# Patient Record
Sex: Female | Born: 1940 | Race: Black or African American | Hispanic: No | Marital: Single | Smoking: Former smoker
Health system: Southern US, Community
[De-identification: ages and names within clinical notes are randomized; demographics above are authoritative.]

## PROBLEM LIST (undated history)

## (undated) DIAGNOSIS — K635 Polyp of colon: Secondary | ICD-10-CM

## (undated) DIAGNOSIS — G4730 Sleep apnea, unspecified: Secondary | ICD-10-CM

## (undated) DIAGNOSIS — N393 Stress incontinence (female) (male): Secondary | ICD-10-CM

## (undated) DIAGNOSIS — M1990 Unspecified osteoarthritis, unspecified site: Secondary | ICD-10-CM

## (undated) DIAGNOSIS — K219 Gastro-esophageal reflux disease without esophagitis: Secondary | ICD-10-CM

## (undated) DIAGNOSIS — L509 Urticaria, unspecified: Secondary | ICD-10-CM

## (undated) DIAGNOSIS — Z973 Presence of spectacles and contact lenses: Secondary | ICD-10-CM

## (undated) DIAGNOSIS — I10 Essential (primary) hypertension: Secondary | ICD-10-CM

## (undated) DIAGNOSIS — E785 Hyperlipidemia, unspecified: Secondary | ICD-10-CM

## (undated) DIAGNOSIS — E538 Deficiency of other specified B group vitamins: Secondary | ICD-10-CM

## (undated) DIAGNOSIS — R0600 Dyspnea, unspecified: Secondary | ICD-10-CM

## (undated) DIAGNOSIS — E119 Type 2 diabetes mellitus without complications: Secondary | ICD-10-CM

## (undated) DIAGNOSIS — N189 Chronic kidney disease, unspecified: Secondary | ICD-10-CM

## (undated) DIAGNOSIS — M5410 Radiculopathy, site unspecified: Secondary | ICD-10-CM

## (undated) DIAGNOSIS — E1140 Type 2 diabetes mellitus with diabetic neuropathy, unspecified: Secondary | ICD-10-CM

## (undated) DIAGNOSIS — D649 Anemia, unspecified: Secondary | ICD-10-CM

## (undated) HISTORY — PX: APPENDECTOMY: SHX54

## (undated) HISTORY — PX: DILATION AND CURETTAGE OF UTERUS: SHX78

## (undated) HISTORY — DX: Radiculopathy, site unspecified: M54.10

## (undated) HISTORY — PX: CARPAL TUNNEL RELEASE: SHX101

## (undated) HISTORY — DX: Type 2 diabetes mellitus with diabetic neuropathy, unspecified: E11.40

## (undated) HISTORY — PX: ABDOMINAL HYSTERECTOMY: SHX81

## (undated) HISTORY — DX: Urticaria, unspecified: L50.9

## (undated) HISTORY — DX: Stress incontinence (female) (male): N39.3

## (undated) HISTORY — PX: CYSTOSCOPY: SHX5120

## (undated) HISTORY — DX: Chronic kidney disease, unspecified: N18.9

## (undated) HISTORY — DX: Deficiency of other specified B group vitamins: E53.8

## (undated) HISTORY — DX: Anemia, unspecified: D64.9

## (undated) HISTORY — DX: Unspecified osteoarthritis, unspecified site: M19.90

## (undated) HISTORY — DX: Polyp of colon: K63.5

## (undated) HISTORY — PX: BREAST BIOPSY: SHX20

## (undated) HISTORY — PX: COLONOSCOPY: SHX174

## (undated) HISTORY — PX: EYE SURGERY: SHX253

---

## 2004-07-26 ENCOUNTER — Observation Stay (HOSPITAL_COMMUNITY): Admission: RE | Admit: 2004-07-26 | Discharge: 2004-07-27 | Payer: Self-pay | Admitting: Gynecology

## 2004-09-09 ENCOUNTER — Ambulatory Visit: Payer: Self-pay | Admitting: Orthopedic Surgery

## 2005-03-30 ENCOUNTER — Inpatient Hospital Stay: Payer: Self-pay | Admitting: Internal Medicine

## 2005-03-30 ENCOUNTER — Other Ambulatory Visit: Payer: Self-pay

## 2005-04-09 ENCOUNTER — Ambulatory Visit: Payer: Self-pay | Admitting: Internal Medicine

## 2005-07-08 ENCOUNTER — Ambulatory Visit: Payer: Self-pay | Admitting: Family Medicine

## 2005-08-02 ENCOUNTER — Emergency Department: Payer: Self-pay | Admitting: Emergency Medicine

## 2005-10-22 ENCOUNTER — Ambulatory Visit: Payer: Self-pay | Admitting: Unknown Physician Specialty

## 2005-11-11 ENCOUNTER — Ambulatory Visit: Payer: Self-pay | Admitting: Internal Medicine

## 2005-11-21 ENCOUNTER — Ambulatory Visit: Payer: Self-pay | Admitting: Internal Medicine

## 2005-12-21 ENCOUNTER — Ambulatory Visit: Payer: Self-pay | Admitting: Internal Medicine

## 2006-01-21 ENCOUNTER — Ambulatory Visit: Payer: Self-pay | Admitting: Internal Medicine

## 2006-02-21 ENCOUNTER — Ambulatory Visit: Payer: Self-pay | Admitting: Internal Medicine

## 2006-04-02 ENCOUNTER — Ambulatory Visit: Payer: Self-pay | Admitting: Internal Medicine

## 2006-04-23 ENCOUNTER — Ambulatory Visit: Payer: Self-pay | Admitting: Internal Medicine

## 2006-05-23 ENCOUNTER — Ambulatory Visit: Payer: Self-pay | Admitting: Internal Medicine

## 2006-07-03 ENCOUNTER — Ambulatory Visit: Payer: Self-pay | Admitting: Internal Medicine

## 2006-07-24 ENCOUNTER — Ambulatory Visit: Payer: Self-pay | Admitting: Internal Medicine

## 2006-08-22 ENCOUNTER — Ambulatory Visit: Payer: Self-pay | Admitting: Internal Medicine

## 2006-09-22 ENCOUNTER — Ambulatory Visit: Payer: Self-pay | Admitting: Internal Medicine

## 2006-10-22 ENCOUNTER — Ambulatory Visit: Payer: Self-pay | Admitting: Internal Medicine

## 2006-10-26 ENCOUNTER — Ambulatory Visit: Payer: Self-pay | Admitting: Internal Medicine

## 2006-11-22 ENCOUNTER — Ambulatory Visit: Payer: Self-pay | Admitting: Internal Medicine

## 2006-12-22 ENCOUNTER — Ambulatory Visit: Payer: Self-pay | Admitting: Internal Medicine

## 2007-01-22 ENCOUNTER — Ambulatory Visit: Payer: Self-pay | Admitting: Internal Medicine

## 2007-02-22 ENCOUNTER — Ambulatory Visit: Payer: Self-pay | Admitting: Internal Medicine

## 2007-03-24 ENCOUNTER — Ambulatory Visit: Payer: Self-pay | Admitting: Internal Medicine

## 2007-04-24 ENCOUNTER — Ambulatory Visit: Payer: Self-pay | Admitting: Internal Medicine

## 2007-05-24 ENCOUNTER — Ambulatory Visit: Payer: Self-pay | Admitting: Internal Medicine

## 2007-06-24 ENCOUNTER — Ambulatory Visit: Payer: Self-pay | Admitting: Internal Medicine

## 2007-06-24 HISTORY — PX: LUMBAR FUSION: SHX111

## 2007-06-27 ENCOUNTER — Inpatient Hospital Stay: Payer: Self-pay | Admitting: Internal Medicine

## 2007-06-27 ENCOUNTER — Other Ambulatory Visit: Payer: Self-pay

## 2007-07-21 ENCOUNTER — Ambulatory Visit: Payer: Self-pay

## 2007-08-06 ENCOUNTER — Ambulatory Visit: Payer: Self-pay | Admitting: Internal Medicine

## 2007-08-22 ENCOUNTER — Ambulatory Visit: Payer: Self-pay | Admitting: Internal Medicine

## 2007-09-22 ENCOUNTER — Ambulatory Visit: Payer: Self-pay | Admitting: Internal Medicine

## 2007-11-09 ENCOUNTER — Ambulatory Visit: Payer: Self-pay | Admitting: Unknown Physician Specialty

## 2007-11-09 ENCOUNTER — Other Ambulatory Visit: Payer: Self-pay

## 2007-11-11 ENCOUNTER — Ambulatory Visit: Payer: Self-pay | Admitting: Internal Medicine

## 2007-11-22 ENCOUNTER — Inpatient Hospital Stay: Payer: Self-pay | Admitting: Unknown Physician Specialty

## 2007-11-23 ENCOUNTER — Ambulatory Visit: Payer: Self-pay | Admitting: Internal Medicine

## 2007-11-23 ENCOUNTER — Other Ambulatory Visit: Payer: Self-pay

## 2007-11-27 ENCOUNTER — Ambulatory Visit: Payer: Self-pay | Admitting: Internal Medicine

## 2007-12-08 ENCOUNTER — Ambulatory Visit: Payer: Self-pay | Admitting: Internal Medicine

## 2007-12-22 ENCOUNTER — Ambulatory Visit: Payer: Self-pay | Admitting: Internal Medicine

## 2008-01-22 ENCOUNTER — Ambulatory Visit: Payer: Self-pay | Admitting: Internal Medicine

## 2008-02-22 ENCOUNTER — Ambulatory Visit: Payer: Self-pay | Admitting: Internal Medicine

## 2008-03-23 ENCOUNTER — Ambulatory Visit: Payer: Self-pay | Admitting: Internal Medicine

## 2008-05-04 ENCOUNTER — Ambulatory Visit: Payer: Self-pay | Admitting: Internal Medicine

## 2008-05-07 ENCOUNTER — Ambulatory Visit: Payer: Self-pay | Admitting: Family Medicine

## 2008-05-23 ENCOUNTER — Ambulatory Visit: Payer: Self-pay | Admitting: Internal Medicine

## 2009-11-30 ENCOUNTER — Ambulatory Visit: Payer: Self-pay | Admitting: Family Medicine

## 2009-12-01 ENCOUNTER — Emergency Department: Payer: Self-pay | Admitting: Emergency Medicine

## 2009-12-20 ENCOUNTER — Ambulatory Visit: Payer: Self-pay

## 2011-05-03 ENCOUNTER — Emergency Department: Payer: Self-pay | Admitting: Emergency Medicine

## 2012-02-21 ENCOUNTER — Ambulatory Visit: Payer: Self-pay | Admitting: Internal Medicine

## 2012-12-16 ENCOUNTER — Ambulatory Visit: Payer: Self-pay

## 2013-11-04 ENCOUNTER — Ambulatory Visit: Payer: Self-pay

## 2013-11-15 ENCOUNTER — Ambulatory Visit: Payer: Self-pay

## 2013-11-17 ENCOUNTER — Ambulatory Visit: Payer: Self-pay

## 2013-11-22 ENCOUNTER — Encounter (HOSPITAL_BASED_OUTPATIENT_CLINIC_OR_DEPARTMENT_OTHER): Payer: Self-pay | Admitting: *Deleted

## 2013-11-22 NOTE — Progress Notes (Signed)
11/22/13 1558  OBSTRUCTIVE SLEEP APNEA  Have you ever been diagnosed with sleep apnea through a sleep study? Yes  If yes, do you have and use a CPAP or BPAP machine every night? 0  Do you snore loudly (loud enough to be heard through closed doors)?  1  Do you often feel tired, fatigued, or sleepy during the daytime? 1  Has anyone observed you stop breathing during your sleep? 1  Do you have, or are you being treated for high blood pressure? 1  BMI more than 35 kg/m2? 1  Age over 62 years old? 1  Neck circumference greater than 40 cm/16 inches? 1  Gender: 0  Obstructive Sleep Apnea Score 7  Score 4 or greater  Results sent to PCP

## 2013-11-22 NOTE — Progress Notes (Signed)
Pt lives [place]-used to see cardiology for htn-stress test-echo done 2011-called-records in storage and would be 2 weeks to get pcp in [place] did sleep study 8 yr ago-used to use cpap-did not use, sent back-still snores and has apnea, Diabetic-controlled.-called for pcp notes-will review with anesthesia-she is having a total knee 7/15-to get clearance for that next month. Needs istat and ekg-bring overnight bag and all meds

## 2013-11-23 ENCOUNTER — Ambulatory Visit: Payer: Self-pay

## 2013-11-24 LAB — PATHOLOGY REPORT

## 2013-11-25 ENCOUNTER — Encounter (HOSPITAL_BASED_OUTPATIENT_CLINIC_OR_DEPARTMENT_OTHER): Payer: Medicare Other | Admitting: Anesthesiology

## 2013-11-25 ENCOUNTER — Ambulatory Visit (HOSPITAL_BASED_OUTPATIENT_CLINIC_OR_DEPARTMENT_OTHER)
Admission: RE | Admit: 2013-11-25 | Discharge: 2013-11-25 | Disposition: A | Discharge status: Home / Self Care | Payer: Medicare Other | Source: Ambulatory Visit | Attending: Orthopedic Surgery | Admitting: Orthopedic Surgery

## 2013-11-25 ENCOUNTER — Ambulatory Visit (HOSPITAL_BASED_OUTPATIENT_CLINIC_OR_DEPARTMENT_OTHER): Payer: Medicare Other | Admitting: Anesthesiology

## 2013-11-25 ENCOUNTER — Encounter (HOSPITAL_BASED_OUTPATIENT_CLINIC_OR_DEPARTMENT_OTHER): Payer: Self-pay | Admitting: *Deleted

## 2013-11-25 ENCOUNTER — Encounter (HOSPITAL_BASED_OUTPATIENT_CLINIC_OR_DEPARTMENT_OTHER)
Admission: RE | Disposition: A | Discharge status: Home / Self Care | Payer: Self-pay | Source: Ambulatory Visit | Attending: Orthopedic Surgery

## 2013-11-25 DIAGNOSIS — G4730 Sleep apnea, unspecified: Secondary | ICD-10-CM | POA: Insufficient documentation

## 2013-11-25 DIAGNOSIS — M1710 Unilateral primary osteoarthritis, unspecified knee: Secondary | ICD-10-CM | POA: Insufficient documentation

## 2013-11-25 DIAGNOSIS — I10 Essential (primary) hypertension: Secondary | ICD-10-CM | POA: Insufficient documentation

## 2013-11-25 DIAGNOSIS — M7520 Bicipital tendinitis, unspecified shoulder: Secondary | ICD-10-CM | POA: Insufficient documentation

## 2013-11-25 DIAGNOSIS — M6740 Ganglion, unspecified site: Secondary | ICD-10-CM | POA: Insufficient documentation

## 2013-11-25 DIAGNOSIS — K219 Gastro-esophageal reflux disease without esophagitis: Secondary | ICD-10-CM | POA: Insufficient documentation

## 2013-11-25 DIAGNOSIS — M67919 Unspecified disorder of synovium and tendon, unspecified shoulder: Secondary | ICD-10-CM | POA: Insufficient documentation

## 2013-11-25 DIAGNOSIS — IMO0002 Reserved for concepts with insufficient information to code with codable children: Secondary | ICD-10-CM

## 2013-11-25 DIAGNOSIS — Z87891 Personal history of nicotine dependence: Secondary | ICD-10-CM | POA: Insufficient documentation

## 2013-11-25 DIAGNOSIS — M719 Bursopathy, unspecified: Secondary | ICD-10-CM | POA: Insufficient documentation

## 2013-11-25 DIAGNOSIS — E119 Type 2 diabetes mellitus without complications: Secondary | ICD-10-CM | POA: Insufficient documentation

## 2013-11-25 HISTORY — DX: Sleep apnea, unspecified: G47.30

## 2013-11-25 HISTORY — DX: Essential (primary) hypertension: I10

## 2013-11-25 HISTORY — DX: Gastro-esophageal reflux disease without esophagitis: K21.9

## 2013-11-25 HISTORY — PX: RESECTION DISTAL CLAVICAL: SHX5053

## 2013-11-25 HISTORY — DX: Type 2 diabetes mellitus without complications: E11.9

## 2013-11-25 HISTORY — PX: SHOULDER ARTHROSCOPY WITH ROTATOR CUFF REPAIR AND SUBACROMIAL DECOMPRESSION: SHX5686

## 2013-11-25 HISTORY — DX: Hyperlipidemia, unspecified: E78.5

## 2013-11-25 HISTORY — DX: Presence of spectacles and contact lenses: Z97.3

## 2013-11-25 HISTORY — DX: Unspecified osteoarthritis, unspecified site: M19.90

## 2013-11-25 LAB — GLUCOSE, CAPILLARY: GLUCOSE-CAPILLARY: 158 mg/dL — AB (ref 70–99)

## 2013-11-25 LAB — POCT I-STAT, CHEM 8
BUN: 28 mg/dL — ABNORMAL HIGH (ref 6–23)
CHLORIDE: 101 meq/L (ref 96–112)
Calcium, Ion: 1.24 mmol/L (ref 1.13–1.30)
Creatinine, Ser: 1.5 mg/dL — ABNORMAL HIGH (ref 0.50–1.10)
GLUCOSE: 136 mg/dL — AB (ref 70–99)
HEMATOCRIT: 42 % (ref 36.0–46.0)
HEMOGLOBIN: 14.3 g/dL (ref 12.0–15.0)
Potassium: 3.9 mEq/L (ref 3.7–5.3)
SODIUM: 140 meq/L (ref 137–147)
TCO2: 26 mmol/L (ref 0–100)

## 2013-11-25 SURGERY — SHOULDER ARTHROSCOPY WITH ROTATOR CUFF REPAIR AND SUBACROMIAL DECOMPRESSION
Anesthesia: General | Site: Shoulder | Laterality: Right

## 2013-11-25 MED ORDER — ALBUTEROL SULFATE HFA 108 (90 BASE) MCG/ACT IN AERS
INHALATION_SPRAY | RESPIRATORY_TRACT | Status: DC | PRN
  Administered 2013-11-25: 2 via RESPIRATORY_TRACT

## 2013-11-25 MED ORDER — ASPIRIN 81 MG PO TABS: 81.0000 mg | ORAL_TABLET | Freq: Every day | ORAL | Status: DC

## 2013-11-25 MED ORDER — FENTANYL CITRATE 0.05 MG/ML IJ SOLN
INTRAMUSCULAR | Status: DC | PRN
  Administered 2013-11-25: 50 ug via INTRAVENOUS

## 2013-11-25 MED ORDER — LACTATED RINGERS IV SOLN
INTRAVENOUS | Status: DC
  Administered 2013-11-25 (×2): via INTRAVENOUS

## 2013-11-25 MED ORDER — SODIUM CHLORIDE 0.9 % IR SOLN
Status: DC | PRN
  Administered 2013-11-25: 12000 mL

## 2013-11-25 MED ORDER — SUCCINYLCHOLINE CHLORIDE 20 MG/ML IJ SOLN
INTRAMUSCULAR | Status: DC | PRN
  Administered 2013-11-25: 100 mg via INTRAVENOUS

## 2013-11-25 MED ORDER — FENTANYL CITRATE 0.05 MG/ML IJ SOLN
INTRAMUSCULAR | Status: AC
  Filled 2013-11-25: qty 6

## 2013-11-25 MED ORDER — PHENYLEPHRINE HCL 10 MG/ML IJ SOLN
INTRAMUSCULAR | Status: DC | PRN
  Administered 2013-11-25: 80 ug via INTRAVENOUS

## 2013-11-25 MED ORDER — OXYCODONE HCL 5 MG/5ML PO SOLN: 5.0000 mg | Freq: Once | ORAL | Status: DC | PRN

## 2013-11-25 MED ORDER — OXYCODONE HCL 5 MG PO TABS: 5.0000 mg | ORAL_TABLET | Freq: Once | ORAL | Status: DC | PRN

## 2013-11-25 MED ORDER — MIDAZOLAM HCL 2 MG/2ML IJ SOLN
INTRAMUSCULAR | Status: AC
  Filled 2013-11-25: qty 2

## 2013-11-25 MED ORDER — OXYCODONE HCL 5 MG PO TABS: 10.0000 mg | ORAL_TABLET | ORAL | Status: DC | PRN

## 2013-11-25 MED ORDER — PROMETHAZINE HCL 25 MG/ML IJ SOLN: 6.2500 mg | INTRAMUSCULAR | Status: DC | PRN

## 2013-11-25 MED ORDER — ROPIVACAINE HCL 5 MG/ML IJ SOLN
INTRAMUSCULAR | Status: DC | PRN
  Administered 2013-11-25: 30 mg via PERINEURAL

## 2013-11-25 MED ORDER — FENTANYL CITRATE 0.05 MG/ML IJ SOLN
50.0000 ug | INTRAMUSCULAR | Status: DC | PRN
  Administered 2013-11-25: 100 ug via INTRAVENOUS

## 2013-11-25 MED ORDER — DEXAMETHASONE SODIUM PHOSPHATE 4 MG/ML IJ SOLN
INTRAMUSCULAR | Status: DC | PRN
  Administered 2013-11-25: 10 mg via INTRAVENOUS

## 2013-11-25 MED ORDER — MIDAZOLAM HCL 2 MG/2ML IJ SOLN
1.0000 mg | INTRAMUSCULAR | Status: DC | PRN
  Administered 2013-11-25: 2 mg via INTRAVENOUS

## 2013-11-25 MED ORDER — EPHEDRINE SULFATE 50 MG/ML IJ SOLN
INTRAMUSCULAR | Status: DC | PRN
  Administered 2013-11-25: 15 mg via INTRAVENOUS

## 2013-11-25 MED ORDER — FENTANYL CITRATE 0.05 MG/ML IJ SOLN
INTRAMUSCULAR | Status: AC
  Filled 2013-11-25: qty 2

## 2013-11-25 MED ORDER — ONDANSETRON HCL 4 MG PO TABS: 4.0000 mg | ORAL_TABLET | Freq: Three times a day (TID) | ORAL | Status: DC | PRN

## 2013-11-25 MED ORDER — LIDOCAINE HCL (CARDIAC) 20 MG/ML IV SOLN
INTRAVENOUS | Status: DC | PRN
  Administered 2013-11-25: 50 mg via INTRAVENOUS

## 2013-11-25 MED ORDER — PHENYLEPHRINE HCL 10 MG/ML IJ SOLN
10.0000 mg | INTRAVENOUS | Status: DC | PRN
  Administered 2013-11-25: 40 ug/min via INTRAVENOUS

## 2013-11-25 MED ORDER — DEXAMETHASONE SODIUM PHOSPHATE 10 MG/ML IJ SOLN
INTRAMUSCULAR | Status: DC | PRN
  Administered 2013-11-25: 6 mg

## 2013-11-25 MED ORDER — DOCUSATE SODIUM 100 MG PO CAPS: 100.0000 mg | ORAL_CAPSULE | Freq: Two times a day (BID) | ORAL | Status: DC

## 2013-11-25 MED ORDER — PROPOFOL 10 MG/ML IV BOLUS
INTRAVENOUS | Status: DC | PRN
  Administered 2013-11-25: 150 mg via INTRAVENOUS

## 2013-11-25 MED ORDER — HYDROMORPHONE HCL PF 1 MG/ML IJ SOLN: 0.2500 mg | INTRAMUSCULAR | Status: DC | PRN

## 2013-11-25 MED ORDER — CLINDAMYCIN PHOSPHATE 600 MG/50ML IV SOLN
INTRAVENOUS | Status: AC
  Filled 2013-11-25: qty 50

## 2013-11-25 SURGICAL SUPPLY — 93 items
ANCHOR SUT BIO SW 4.75X19.1 (Anchor) ×4 IMPLANT
BENZOIN TINCTURE PRP APPL 2/3 (GAUZE/BANDAGES/DRESSINGS) ×2 IMPLANT
BLADE 15 SAFETY STRL DISP (BLADE) ×2 IMPLANT
BLADE AVERAGE 25X9 (BLADE) ×2 IMPLANT
BLADE CUTTER GATOR 3.5 (BLADE) ×2 IMPLANT
BLADE CUTTER MENIS 5.5 (BLADE) IMPLANT
BLADE GREAT WHITE 4.2 (BLADE) IMPLANT
BLADE SURG ROTATE 9660 (MISCELLANEOUS) IMPLANT
BUR OVAL 4.0 (BURR) IMPLANT
BUR OVAL 6.0 (BURR) ×2 IMPLANT
CANISTER SUCT 3000ML (MISCELLANEOUS) IMPLANT
CANNULA 5.75X71 LONG (CANNULA) IMPLANT
CANNULA TWIST IN 8.25X7CM (CANNULA) ×2 IMPLANT
CHLORAPREP W/TINT 26ML (MISCELLANEOUS) ×2 IMPLANT
DECANTER SPIKE VIAL GLASS SM (MISCELLANEOUS) IMPLANT
DRAPE INCISE IOBAN 66X45 STRL (DRAPES) ×2 IMPLANT
DRAPE SHOULDER BEACH CHAIR (DRAPES) ×2 IMPLANT
DRAPE STERI 35X30 U-POUCH (DRAPES) IMPLANT
DRAPE U 20/CS (DRAPES) ×2 IMPLANT
DRAPE U-SHAPE 47X51 STRL (DRAPES) ×2 IMPLANT
DRAPE U-SHAPE 76X120 STRL (DRAPES) IMPLANT
DRSG EMULSION OIL 3X3 NADH (GAUZE/BANDAGES/DRESSINGS) ×2 IMPLANT
DRSG PAD ABDOMINAL 8X10 ST (GAUZE/BANDAGES/DRESSINGS) ×2 IMPLANT
DRSG TEGADERM 4X4.75 (GAUZE/BANDAGES/DRESSINGS) IMPLANT
ELECT MENISCUS 165MM 90D (ELECTRODE) IMPLANT
ELECT NEEDLE TIP 2.8 STRL (NEEDLE) IMPLANT
ELECT REM PT RETURN 9FT ADLT (ELECTROSURGICAL) ×2
ELECTRODE REM PT RTRN 9FT ADLT (ELECTROSURGICAL) ×1 IMPLANT
GAUZE SPONGE 4X4 12PLY STRL (GAUZE/BANDAGES/DRESSINGS) ×2 IMPLANT
GAUZE XEROFORM 1X8 LF (GAUZE/BANDAGES/DRESSINGS) IMPLANT
GLOVE BIO SURGEON STRL SZ7.5 (GLOVE) IMPLANT
GLOVE BIO SURGEON STRL SZ8 (GLOVE) IMPLANT
GLOVE BIOGEL PI IND STRL 7.0 (GLOVE) ×1 IMPLANT
GLOVE BIOGEL PI IND STRL 7.5 (GLOVE) ×1 IMPLANT
GLOVE BIOGEL PI IND STRL 8 (GLOVE) ×2 IMPLANT
GLOVE BIOGEL PI IND STRL 8.5 (GLOVE) IMPLANT
GLOVE BIOGEL PI INDICATOR 7.0 (GLOVE) ×1
GLOVE BIOGEL PI INDICATOR 7.5 (GLOVE) ×1
GLOVE BIOGEL PI INDICATOR 8 (GLOVE) ×2
GLOVE BIOGEL PI INDICATOR 8.5 (GLOVE)
GLOVE EXAM NITRILE MD LF STRL (GLOVE) ×2 IMPLANT
GLOVE SURG SS PI 6.5 STRL IVOR (GLOVE) ×2 IMPLANT
GLOVE SURG SS PI 7.5 STRL IVOR (GLOVE) ×4 IMPLANT
GLOVE SURG SS PI 8.0 STRL IVOR (GLOVE) ×2 IMPLANT
GOWN STRL REUS W/ TWL LRG LVL3 (GOWN DISPOSABLE) ×2 IMPLANT
GOWN STRL REUS W/ TWL XL LVL3 (GOWN DISPOSABLE) ×1 IMPLANT
GOWN STRL REUS W/TWL LRG LVL3 (GOWN DISPOSABLE) ×2
GOWN STRL REUS W/TWL XL LVL3 (GOWN DISPOSABLE) ×1
MANIFOLD NEPTUNE II (INSTRUMENTS) ×2 IMPLANT
NDL SUT 6 .5 CRC .975X.05 MAYO (NEEDLE) IMPLANT
NEEDLE MAYO TAPER (NEEDLE)
NEEDLE SCORPION MULTI FIRE (NEEDLE) ×2 IMPLANT
NS IRRIG 1000ML POUR BTL (IV SOLUTION) ×2 IMPLANT
PACK ARTHROSCOPY DSU (CUSTOM PROCEDURE TRAY) ×2 IMPLANT
PACK BASIN DAY SURGERY FS (CUSTOM PROCEDURE TRAY) ×2 IMPLANT
PENCIL BUTTON HOLSTER BLD 10FT (ELECTRODE) ×2 IMPLANT
SET ARTHROSCOPY TUBING (MISCELLANEOUS) ×1
SET ARTHROSCOPY TUBING LN (MISCELLANEOUS) ×1 IMPLANT
SLEEVE SCD COMPRESS KNEE MED (MISCELLANEOUS) ×2 IMPLANT
SLING ARM IMMOBILIZER LRG (SOFTGOODS) IMPLANT
SLING ARM IMMOBILIZER MED (SOFTGOODS) ×2 IMPLANT
SLING ARM LRG ADULT FOAM STRAP (SOFTGOODS) IMPLANT
SLING ARM MED ADULT FOAM STRAP (SOFTGOODS) IMPLANT
SLING ARM XL FOAM STRAP (SOFTGOODS) IMPLANT
SPONGE LAP 18X18 X RAY DECT (DISPOSABLE) IMPLANT
SPONGE LAP 4X18 X RAY DECT (DISPOSABLE) ×2 IMPLANT
STAPLER VISISTAT 35W (STAPLE) IMPLANT
STRIP CLOSURE SKIN 1/2X4 (GAUZE/BANDAGES/DRESSINGS) ×2 IMPLANT
SUCTION FRAZIER TIP 10 FR DISP (SUCTIONS) ×2 IMPLANT
SUT ETHIBOND 2 OS 4 DA (SUTURE) IMPLANT
SUT ETHILON 2 0 FS 18 (SUTURE) IMPLANT
SUT ETHILON 3 0 PS 1 (SUTURE) IMPLANT
SUT FIBERWIRE #2 38 T-5 BLUE (SUTURE)
SUT MNCRL AB 3-0 PS2 18 (SUTURE) ×2 IMPLANT
SUT MNCRL AB 4-0 PS2 18 (SUTURE) ×2 IMPLANT
SUT MON AB 2-0 CT1 36 (SUTURE) ×2 IMPLANT
SUT RETRIEVER MED (INSTRUMENTS) IMPLANT
SUT TIGER TAPE 7 IN WHITE (SUTURE) ×2 IMPLANT
SUT VIC AB 0 CT1 27 (SUTURE)
SUT VIC AB 0 CT1 27XBRD ANBCTR (SUTURE) IMPLANT
SUT VIC AB 0 SH 27 (SUTURE) ×2 IMPLANT
SUT VIC AB 2-0 SH 27 (SUTURE)
SUT VIC AB 2-0 SH 27XBRD (SUTURE) IMPLANT
SUT VIC AB 3-0 FS2 27 (SUTURE) IMPLANT
SUT VIC AB 3-0 SH 27 (SUTURE)
SUT VIC AB 3-0 SH 27X BRD (SUTURE) IMPLANT
SUTURE FIBERWR #2 38 T-5 BLUE (SUTURE) IMPLANT
SYR BULB 3OZ (MISCELLANEOUS) ×2 IMPLANT
TAPE FIBER 2MM 7IN #2 BLUE (SUTURE) ×2 IMPLANT
TOWEL OR 17X24 6PK STRL BLUE (TOWEL DISPOSABLE) ×2 IMPLANT
TOWEL OR NON WOVEN STRL DISP B (DISPOSABLE) ×2 IMPLANT
WAND STAR VAC 90 (SURGICAL WAND) ×2 IMPLANT
YANKAUER SUCT BULB TIP NO VENT (SUCTIONS) ×2 IMPLANT

## 2013-11-25 NOTE — Op Note (Signed)
11/25/2013  4:48 PM  PATIENT:  Tina Mcdonald    PRE-OPERATIVE DIAGNOSIS:  Ganglion of right shoulder joint   POST-OPERATIVE DIAGNOSIS:  Same  PROCEDURE:  SHOULDER ARTHROSCOPY WITH DEBRIDEMENT ROTATOR CUFF REPAIR AND BICEPS TENODESIS, AND MANIPULATION, RIGHT SHOULDER OPEN RESECTION DISTAL CLAVICAL EXCISION SOFT TISSUE TUMOR SHOULDER DEEP SUBFASCIAL INTRAMUSCULAR/DEBRIDEMENT/ROTATOR CUFF REPAIR/BICEPS TENODESIS/MANIPULATION  SURGEON:  Renette Butters, MD  ASSISTANT: Joya Gaskins OPA  ANESTHESIA:   General  PREOPERATIVE INDICATIONS:  Tina Mcdonald is a  73 y.o. female with a diagnosis of Ganglion of right shoulder joint  who failed conservative measures and elected for surgical management.    The risks benefits and alternatives were discussed with the patient preoperatively including but not limited to the risks of infection, bleeding, nerve injury, cardiopulmonary complications, the need for revision surgery, among others, and the patient was willing to proceed.  OPERATIVE IMPLANTS: 2 swivel lock anchors  OPERATIVE FINDINGS: superior AC joint ganglion, supra/infra tendon tears that were reducible. Labral fraying, ruptured biceps  BLOOD LOSS: minimal  COMPLICATIONS: none  OPERATIVE PROCEDURE:  Patient was identified in the preoperative holding area and site was marked by me He was transported to the operating theater and placed on the table in beach chair position taking care to pad all bony prominences. After a preincinduction time out anesthesia was induced. The right upper extremity was prepped and draped in normal sterile fashion and a pre-incision timeout was performed. Tina Mcdonald received clinda for preoperative antibiotics.   Initially made a posterior arthroscopic portal and inserted the arthroscope into the glenohumeral joint. tour of the joint demonstrated the above operative findings  I created an anterior portal just lateral to the coracoid under direct visualization  using a spinal needle.  The biceps tendon was absent  I debrided her labral fraying  I then introduced the arthroscope into the subacromial space and brought the shaver into the anterior portal. I debrided the bursa for appropriate visualization.  I then performed a subacromial decompression using combination of the shaver ArthroCare and burr using a cutting block technique. As happy with the final elevation of the subacromial space on multiple portal views.    I debrided the rotator cuff tear and examined its mobility. There was a roughly 35 millimeters tear and I debrided the tear here. I then debrided the footprint to good bone for placement of the tendon. Next I marked the spot for the 2 swivel locks  I then removed all arthroscopic equipment instruments and she into the ganglion and distal clavicle. I made a roughly 3 cm incision over top of the a.c. joint. I dissected down to the ganglion and skeletonized the ganglion was relatively firm as mentioned before in my operative preop note. I then skeletonized the ganglion and its stalk did come from the a.c. joint I incised this and I did send it to pathology given a firm nature of it. I then identified the a.c. joint incised the periosteum around the distal clavicle measured out 1 cm and resected it using a fan saw blade. I palpated the edges and family resection of the appropriate. I then thoroughly irrigated this wound and closed in layers  Next I removed all arthroscopic equipment expressed all fluid and closed the portals with a nylon stitch.   A sterile dressing was applied the patient was taken the PACU in stable condition.  POST OPERATIVE PLAN: The patient will be in a sling full-time and keep the dressings clean dry and intact. DVT prophylaxis will consist  of early ambulation and ASA 81 daily while immobile

## 2013-11-25 NOTE — Anesthesia Preprocedure Evaluation (Addendum)
Anesthesia Evaluation  Patient identified by MRN, date of birth, ID band Patient awake    Reviewed: Allergy & Precautions, H&P , NPO status , Patient's Chart, lab work & pertinent test results, reviewed documented beta blocker date and time   History of Anesthesia Complications Negative for: history of anesthetic complications  Airway Mallampati: II TM Distance: >3 FB Neck ROM: Full    Dental  (+) Poor Dentition, Dental Advisory Given, Chipped,    Pulmonary sleep apnea , former smoker,    Pulmonary exam normal       Cardiovascular hypertension, Pt. on medications and Pt. on home beta blockers     Neuro/Psych negative neurological ROS  negative psych ROS   GI/Hepatic Neg liver ROS, GERD-  Medicated,  Endo/Other  diabetes  Renal/GU negative Renal ROS     Musculoskeletal negative musculoskeletal ROS (+)   Abdominal   Peds  Hematology   Anesthesia Other Findings   Reproductive/Obstetrics                          Anesthesia Physical Anesthesia Plan  ASA: III  Anesthesia Plan: General   Post-op Pain Management:    Induction: Intravenous  Airway Management Planned: Oral ETT  Additional Equipment:   Intra-op Plan:   Post-operative Plan: Extubation in OR  Informed Consent: I have reviewed the patients History and Physical, chart, labs and discussed the procedure including the risks, benefits and alternatives for the proposed anesthesia with the patient or authorized representative who has indicated his/her understanding and acceptance.   Dental advisory given  Plan Discussed with: CRNA, Anesthesiologist and Surgeon  Anesthesia Plan Comments:         Anesthesia Quick Evaluation

## 2013-11-25 NOTE — Anesthesia Postprocedure Evaluation (Signed)
Anesthesia Post Note  Patient: Tina Mcdonald  Procedure(s) Performed: Procedure(s) (LRB): SHOULDER ARTHROSCOPY WITH DEBRIDEMENT ROTATOR CUFF REPAIR  A (Right) RIGHT SHOULDER OPEN RESECTION DISTAL CLAVICAL EXCISION SOFT TISSUE TUMOR SHOULDER DEEP SUBFASCIAL INTRAMUSCULAR/DEBRIDEMENT/ROTATOR CUFF REPAIR/BICEPS TENODESIS/MANIPULATION (Right)  Anesthesia type: general  Patient location: PACU  Post pain: Pain level controlled  Post assessment: Patient's Cardiovascular Status Stable  Last Vitals:  Filed Vitals:   11/25/13 1745  BP: 129/49  Pulse: 92  Temp:   Resp: 21    Post vital signs: Reviewed and stable  Level of consciousness: sedated  Complications: No apparent anesthesia complications

## 2013-11-25 NOTE — Discharge Instructions (Signed)
Regional Anesthesia Blocks ° °1. Numbness or the inability to move the "blocked" extremity may last from 3-48 hours after placement. The length of time depends on the medication injected and your individual response to the medication. If the numbness is not going away after 48 hours, call your surgeon. ° °2. The extremity that is blocked will need to be protected until the numbness is gone and the  Strength has returned. Because you cannot feel it, you will need to take extra care to avoid injury. Because it may be weak, you may have difficulty moving it or using it. You may not know what position it is in without looking at it while the block is in effect. ° °3. For blocks in the legs and feet, returning to weight bearing and walking needs to be done carefully. You will need to wait until the numbness is entirely gone and the strength has returned. You should be able to move your leg and foot normally before you try and bear weight or walk. You will need someone to be with you when you first try to ensure you do not fall and possibly risk injury. ° °4. Bruising and tenderness at the needle site are common side effects and will resolve in a few days. ° °5. Persistent numbness or new problems with movement should be communicated to the surgeon or the [facility] ([phone])/ [facility] ([phone]). ° ° ° °Post Anesthesia Home Care Instructions ° °Activity: °Get plenty of rest for the remainder of the day. A responsible adult should stay with you for 24 hours following the procedure.  °For the next 24 hours, DO NOT: °-Drive a car °-Operate machinery °-Drink alcoholic beverages °-Take any medication unless instructed by your physician °-Make any legal decisions or sign important papers. ° °Meals: °Start with liquid foods such as gelatin or soup. Progress to regular foods as tolerated. Avoid greasy, spicy, heavy foods. If nausea and/or vomiting occur, drink only clear liquids until the  nausea and/or vomiting subsides. Call your physician if vomiting continues. ° °Special Instructions/Symptoms: °Your throat may feel dry or sore from the anesthesia or the breathing tube placed in your throat during surgery. If this causes discomfort, gargle with warm salt water. The discomfort should disappear within 24 hours. ° °

## 2013-11-25 NOTE — Progress Notes (Signed)
Assisted Dr. Singer with right, ultrasound guided, interscalene  block. Side rails up, monitors on throughout procedure. See vital signs in flow sheet. Tolerated Procedure well. 

## 2013-11-25 NOTE — H&P (Signed)
  [facility] [address] [place], [place] [address] [phone] A Division of [facility]  Ninetta Lights, M.D.   Robert A. Noemi Chapel, M.D.   Faythe Casa, M.D.   Johnny Bridge, M.D.   Almedia Balls, M.D Ernesta Amble. Percell Miller, M.D.  Joseph Pierini, M.D.  Lanier Prude, M.D.    Verner Chol, M.D. Mary L. Fenton Malling, PA-C  Kirstin A. Shepperson, PA-C  Josh [place], PA-C [place], [place]   RE: Tina Mcdonald, Tina Mcdonald   [id number]      DOB: April 18, 1941 INTIAL EVALUATION: 11-09-13 Reason for visit:  Right knee pain right shoulder lump. History of present illness: She has had right knee pain for several years she had  cortisone injections the last did not provide any relief. She has tried bracing and NSAID's without relief. Pain in on the medial and anterior aspect of her right knee. Her right shoulder has had a lump for roughly 2 months, it was initially non-tender but is slightly tender at this point because it is right under her bra strap. It has gotten bigger. She has not done anything to treat this. Please see associated documentation for this clinic visit for further past medical, family, surgical and social history, review of systems, and exam findings as this was reviewed by me.  EXAMINATION: Well appearing female in no apparent distress. Right lower extremity is neurovascularly intact she has occasional numbness from her spine surgery but otherwise sensation is intact. She has tenderness over the medial joint line with an antalgic gait. She has tenderness over her patellofemoral space. She has correctable clinical varus. Skin is benign no skin breakdown in her feet. Right upper extremity has mobile mildly tender mass over her AC joint it feels fluid filled and superficial. She has pain with shoulder range of motion, good rotator cuff strength.   IMAGING: X-rays reviewed by me:  show severe medial compartmental  arthritis and patellofemoral lateral based arthritis. Shoulder films demonstrate no obvious osseous abnormality with AC arthrosis.   ASSESSMENT: She has bicompartmental arthritis right knee. Right shoulder ganglion cyst AC joint.  PLAN: 1. For her shoulder I will get an MRI with and without contrast to confirm ganglion cyst. If that is the case she would like it resected, I offered non-operative management versus aspiration and injection but she would like to have a resection if it is a ganglion. 2. She would like to have right total knee arthroplasty. Discussed options including unloading brace hyaluronic acid injection cortisone injection NSAID use non-operative management physical therapy and weight loss. She would like to go forward with total knee arthroplasty but would like to do her shoulder first.  3. We have her prepared for both surgeries and I will call her if the MRI reveals a ganglion, if not we may have to refer her to somebody else for management of this prior to her total knee replacement. 4.  Ernesta Amble.  Percell Miller, M.D. Electronically verified by Ernesta Amble. Percell Miller, M.D. TDM:kah cc:  Golden Pop, MD fax [phone]  D 11-09-13 T 11-10-13

## 2013-11-25 NOTE — Anesthesia Procedure Notes (Signed)
Anesthesia Regional Block:  Interscalene brachial plexus block  Pre-Anesthetic Checklist: ,, timeout performed, Correct Patient, Correct Site, Correct Laterality, Correct Procedure, Correct Position, site marked, Risks and benefits discussed,  Surgical consent,  Pre-op evaluation,  At surgeon's request and post-op pain management  Laterality: Right  Prep: chloraprep       Needles:  Injection technique: Single-shot  Needle Type: Echogenic Stimulator Needle     Needle Length: 5cm 5 cm Needle Gauge: 22 and 22 G    Additional Needles:  Procedures: ultrasound guided (picture in chart) and nerve stimulator Interscalene brachial plexus block  Nerve Stimulator or Paresthesia:  Response: bicep contraction, 0.45 mA,   Additional Responses:   Narrative:  Start time: 11/25/2013 1:08 PM End time: 11/25/2013 1:18 PM Injection made incrementally with aspirations every 5 mL.  Performed by: Personally  Anesthesiologist: J. Tamela Gammon, MD  Additional Notes: Functioning IV was confirmed and monitors applied.  A 51mm 22ga echogenic arrow stimulator was used. Sterile prep and drape,hand hygiene and sterile gloves were used.Ultrasound guidance: relevant anatomy identified, needle position confirmed, local anesthetic spread visualized around nerve(s)., vascular puncture avoided.  Image printed for medical record.  Negative aspiration and negative test dose prior to incremental administration of local anesthetic. The patient tolerated the procedure well.

## 2013-11-25 NOTE — Transfer of Care (Signed)
Immediate Anesthesia Transfer of Care Note  Patient: Tina Mcdonald  Procedure(s) Performed: Procedure(s): SHOULDER ARTHROSCOPY WITH DEBRIDEMENT ROTATOR CUFF REPAIR  A (Right) RIGHT SHOULDER OPEN RESECTION DISTAL CLAVICAL EXCISION SOFT TISSUE TUMOR SHOULDER DEEP SUBFASCIAL INTRAMUSCULAR/DEBRIDEMENT/ROTATOR CUFF REPAIR/BICEPS TENODESIS/MANIPULATION (Right)  Patient Location: PACU  Anesthesia Type:General and Regional  Level of Consciousness: awake, alert  and oriented  Airway & Oxygen Therapy: Patient Spontanous Breathing and Patient connected to face mask oxygen  Post-op Assessment: Report given to PACU RN and Post -op Vital signs reviewed and stable  Post vital signs: Reviewed and stable  Complications: No apparent anesthesia complications

## 2013-11-25 NOTE — Interval H&P Note (Signed)
History and Physical Interval Note:  11/25/2013 8:58 AM  Tina Mcdonald  has presented today for surgery, with the diagnosis of Ganglion of joint   The various methods of treatment have been discussed with the patient and family. After consideration of risks, benefits and other options for treatment, the patient has consented to  Procedure(s): SHOULDER ARTHROSCOPY WITH DEBRIDMENT ROTATOR CUFF REPAIR AND BICEPS TENODESIS, AND MANIPULATION (Right) RIGHT SHOULDER OPEN RESECTION DISTAL CLAVICAL EXCISION SOFT TISSUE TUMOR SHOULDER DEEP SUBFASCIAL INTRAMUSCULAR/DEBRIDEMENT/ROTATOR CUFF REPAIR/BICEPS TENODESIS/MANIPULATION (Right) as a surgical intervention .  The patient's history has been reviewed, patient examined, no change in status, stable for surgery.  I have reviewed the patient's chart and labs.  Questions were answered to the patient's satisfaction.     Renette Butters

## 2013-11-30 ENCOUNTER — Encounter (HOSPITAL_BASED_OUTPATIENT_CLINIC_OR_DEPARTMENT_OTHER): Payer: Self-pay | Admitting: Orthopedic Surgery

## 2013-12-19 ENCOUNTER — Other Ambulatory Visit (HOSPITAL_COMMUNITY): Payer: Self-pay

## 2013-12-27 ENCOUNTER — Encounter (HOSPITAL_COMMUNITY): Admission: RE | Payer: Self-pay | Source: Ambulatory Visit

## 2013-12-27 ENCOUNTER — Inpatient Hospital Stay (HOSPITAL_COMMUNITY): Admission: RE | Admit: 2013-12-27 | Payer: Medicare Other | Source: Ambulatory Visit | Admitting: Orthopedic Surgery

## 2013-12-27 SURGERY — ARTHROPLASTY, KNEE, TOTAL
Anesthesia: General | Laterality: Right

## 2014-09-29 DIAGNOSIS — E1140 Type 2 diabetes mellitus with diabetic neuropathy, unspecified: Secondary | ICD-10-CM | POA: Diagnosis not present

## 2014-09-29 DIAGNOSIS — E1121 Type 2 diabetes mellitus with diabetic nephropathy: Secondary | ICD-10-CM | POA: Diagnosis not present

## 2014-09-29 DIAGNOSIS — E780 Pure hypercholesterolemia: Secondary | ICD-10-CM | POA: Diagnosis not present

## 2014-09-29 DIAGNOSIS — I129 Hypertensive chronic kidney disease with stage 1 through stage 4 chronic kidney disease, or unspecified chronic kidney disease: Secondary | ICD-10-CM | POA: Diagnosis not present

## 2014-10-16 DIAGNOSIS — E11319 Type 2 diabetes mellitus with unspecified diabetic retinopathy without macular edema: Secondary | ICD-10-CM | POA: Diagnosis not present

## 2014-11-17 DIAGNOSIS — H18413 Arcus senilis, bilateral: Secondary | ICD-10-CM | POA: Diagnosis not present

## 2014-11-17 DIAGNOSIS — E119 Type 2 diabetes mellitus without complications: Secondary | ICD-10-CM | POA: Diagnosis not present

## 2014-11-17 DIAGNOSIS — H2513 Age-related nuclear cataract, bilateral: Secondary | ICD-10-CM | POA: Diagnosis not present

## 2014-11-17 DIAGNOSIS — I10 Essential (primary) hypertension: Secondary | ICD-10-CM | POA: Diagnosis not present

## 2014-11-23 DIAGNOSIS — H2511 Age-related nuclear cataract, right eye: Secondary | ICD-10-CM | POA: Diagnosis not present

## 2014-12-29 DIAGNOSIS — Z029 Encounter for administrative examinations, unspecified: Secondary | ICD-10-CM | POA: Diagnosis not present

## 2014-12-30 DIAGNOSIS — K5730 Diverticulosis of large intestine without perforation or abscess without bleeding: Secondary | ICD-10-CM | POA: Diagnosis not present

## 2014-12-30 DIAGNOSIS — R918 Other nonspecific abnormal finding of lung field: Secondary | ICD-10-CM | POA: Diagnosis not present

## 2014-12-30 DIAGNOSIS — Z79899 Other long term (current) drug therapy: Secondary | ICD-10-CM | POA: Diagnosis not present

## 2014-12-30 DIAGNOSIS — N390 Urinary tract infection, site not specified: Secondary | ICD-10-CM | POA: Diagnosis not present

## 2014-12-30 DIAGNOSIS — E119 Type 2 diabetes mellitus without complications: Secondary | ICD-10-CM | POA: Diagnosis not present

## 2014-12-30 DIAGNOSIS — I10 Essential (primary) hypertension: Secondary | ICD-10-CM | POA: Diagnosis not present

## 2014-12-30 DIAGNOSIS — K760 Fatty (change of) liver, not elsewhere classified: Secondary | ICD-10-CM | POA: Diagnosis not present

## 2015-01-08 DIAGNOSIS — H2511 Age-related nuclear cataract, right eye: Secondary | ICD-10-CM | POA: Diagnosis not present

## 2015-01-08 DIAGNOSIS — H25811 Combined forms of age-related cataract, right eye: Secondary | ICD-10-CM | POA: Diagnosis not present

## 2015-01-09 DIAGNOSIS — H2512 Age-related nuclear cataract, left eye: Secondary | ICD-10-CM | POA: Diagnosis not present

## 2015-01-16 ENCOUNTER — Encounter: Payer: Self-pay | Admitting: Unknown Physician Specialty

## 2015-01-16 ENCOUNTER — Ambulatory Visit (INDEPENDENT_AMBULATORY_CARE_PROVIDER_SITE_OTHER): Payer: Medicare Other | Admitting: Unknown Physician Specialty

## 2015-01-16 VITALS — BP 148/76 | HR 93 | Temp 98.6°F | Ht 60.5 in | Wt 183.6 lb

## 2015-01-16 DIAGNOSIS — D649 Anemia, unspecified: Secondary | ICD-10-CM

## 2015-01-16 DIAGNOSIS — K635 Polyp of colon: Secondary | ICD-10-CM

## 2015-01-16 DIAGNOSIS — D509 Iron deficiency anemia, unspecified: Secondary | ICD-10-CM | POA: Insufficient documentation

## 2015-01-16 DIAGNOSIS — E1140 Type 2 diabetes mellitus with diabetic neuropathy, unspecified: Secondary | ICD-10-CM | POA: Insufficient documentation

## 2015-01-16 DIAGNOSIS — E785 Hyperlipidemia, unspecified: Secondary | ICD-10-CM

## 2015-01-16 DIAGNOSIS — M1990 Unspecified osteoarthritis, unspecified site: Secondary | ICD-10-CM

## 2015-01-16 DIAGNOSIS — R05 Cough: Secondary | ICD-10-CM

## 2015-01-16 DIAGNOSIS — I152 Hypertension secondary to endocrine disorders: Secondary | ICD-10-CM | POA: Insufficient documentation

## 2015-01-16 DIAGNOSIS — E1122 Type 2 diabetes mellitus with diabetic chronic kidney disease: Secondary | ICD-10-CM | POA: Insufficient documentation

## 2015-01-16 DIAGNOSIS — N393 Stress incontinence (female) (male): Secondary | ICD-10-CM

## 2015-01-16 DIAGNOSIS — E7800 Pure hypercholesterolemia, unspecified: Secondary | ICD-10-CM | POA: Insufficient documentation

## 2015-01-16 DIAGNOSIS — R059 Cough, unspecified: Secondary | ICD-10-CM

## 2015-01-16 DIAGNOSIS — M5410 Radiculopathy, site unspecified: Secondary | ICD-10-CM

## 2015-01-16 DIAGNOSIS — L509 Urticaria, unspecified: Secondary | ICD-10-CM

## 2015-01-16 DIAGNOSIS — D631 Anemia in chronic kidney disease: Secondary | ICD-10-CM | POA: Insufficient documentation

## 2015-01-16 DIAGNOSIS — I1310 Hypertensive heart and chronic kidney disease without heart failure, with stage 1 through stage 4 chronic kidney disease, or unspecified chronic kidney disease: Secondary | ICD-10-CM | POA: Insufficient documentation

## 2015-01-16 DIAGNOSIS — K219 Gastro-esophageal reflux disease without esophagitis: Secondary | ICD-10-CM

## 2015-01-16 DIAGNOSIS — E1165 Type 2 diabetes mellitus with hyperglycemia: Secondary | ICD-10-CM

## 2015-01-16 DIAGNOSIS — E1159 Type 2 diabetes mellitus with other circulatory complications: Secondary | ICD-10-CM | POA: Insufficient documentation

## 2015-01-16 DIAGNOSIS — I129 Hypertensive chronic kidney disease with stage 1 through stage 4 chronic kidney disease, or unspecified chronic kidney disease: Secondary | ICD-10-CM

## 2015-01-16 DIAGNOSIS — N183 Chronic kidney disease, stage 3 (moderate): Secondary | ICD-10-CM

## 2015-01-16 DIAGNOSIS — E1149 Type 2 diabetes mellitus with other diabetic neurological complication: Secondary | ICD-10-CM

## 2015-01-16 DIAGNOSIS — E1169 Type 2 diabetes mellitus with other specified complication: Secondary | ICD-10-CM | POA: Insufficient documentation

## 2015-01-16 DIAGNOSIS — E119 Type 2 diabetes mellitus without complications: Secondary | ICD-10-CM | POA: Insufficient documentation

## 2015-01-16 DIAGNOSIS — N189 Chronic kidney disease, unspecified: Secondary | ICD-10-CM | POA: Insufficient documentation

## 2015-01-16 DIAGNOSIS — E538 Deficiency of other specified B group vitamins: Secondary | ICD-10-CM

## 2015-01-16 DIAGNOSIS — I10 Essential (primary) hypertension: Secondary | ICD-10-CM

## 2015-01-16 MED ORDER — OMEPRAZOLE 20 MG PO CPDR: 20.0000 mg | DELAYED_RELEASE_CAPSULE | Freq: Every day | ORAL | Status: DC

## 2015-01-16 MED ORDER — GLUCOSE BLOOD VI STRP: ORAL_STRIP | Status: DC

## 2015-01-16 NOTE — Progress Notes (Signed)
BP 148/76 mmHg  Pulse 93  Temp(Src) 98.6 F (37 C)  Ht 5' 0.5" (1.537 m)  Wt 183 lb 9.6 oz (83.28 kg)  BMI 35.25 kg/m2  SpO2 97%  LMP  (LMP Unknown)   Subjective:    Patient ID: Tina Mcdonald, female    DOB: April 03, 1941, 74 y.o.   MRN: [id number]  HPI: Tina Mcdonald is a 74 y.o. female  Chief Complaint  Patient presents with  . Cough    pt states cough has been going on for a few months, gradually getting worse  . Gastrophageal Reflux    pt states it feels like all of her food gets stuck  . Diarrhea  . Emesis   Cough This is a new problem. The current episode started more than 1 month ago. The problem has been gradually worsening. The problem occurs constantly. The cough is productive of purulent sputum. Pertinent negatives include no chest pain, chills, ear congestion, ear pain, fever, headaches, heartburn, hemoptysis, myalgias, nasal congestion, postnasal drip, rash, rhinorrhea, sore throat, shortness of breath, sweats, weight loss or wheezing.  Gastrophageal Reflux She complains of coughing. She reports no chest pain, no heartburn, no sore throat or no wheezing. When she eats, it feels like something gets stuck and has to take something before and after she eats.  It wakes her up around 2A and it causes her to vomit. This is a chronic problem. The problem occurs constantly. The problem has been gradually worsening. Exacerbated by: [employer] foods. Pertinent negatives include no anemia, fatigue, melena, muscle weakness, orthopnea or weight loss. There are no known risk factors. She has tried an antacid (Taking OTC Ranitidine.  ) for the symptoms.     Relevant past medical, surgical, family and social history reviewed and updated as indicated. Interim medical history since our last visit reviewed. Allergies and medications reviewed and updated.  Review of Systems  Constitutional: Negative for fever, chills, weight loss and fatigue.  HENT: Negative for ear pain, postnasal  drip, rhinorrhea and sore throat.   Respiratory: Positive for cough. Negative for hemoptysis, shortness of breath and wheezing.   Cardiovascular: Negative for chest pain.  Gastrointestinal: Negative for heartburn and melena.  Musculoskeletal: Negative for myalgias and muscle weakness.  Skin: Negative for rash.  Neurological: Negative for headaches.    Per HPI unless specifically indicated above     Objective:    BP 148/76 mmHg  Pulse 93  Temp(Src) 98.6 F (37 C)  Ht 5' 0.5" (1.537 m)  Wt 183 lb 9.6 oz (83.28 kg)  BMI 35.25 kg/m2  SpO2 97%  LMP  (LMP Unknown)  Wt Readings from Last 3 Encounters:  01/16/15 183 lb 9.6 oz (83.28 kg)  09/29/14 189 lb (85.73 kg)  11/25/13 180 lb (81.647 kg)    Physical Exam  Constitutional: She appears well-developed.  HENT:  Head: Normocephalic and atraumatic.  Cardiovascular: Normal rate and regular rhythm.   Murmur heard. Pulmonary/Chest: Effort normal and breath sounds normal.  Musculoskeletal: Normal range of motion.  Neurological: She is alert.  Skin: Skin is warm and dry.  Psychiatric: She has a normal mood and affect. Her behavior is normal. Judgment and thought content normal.    Results for orders placed or performed during the hospital encounter of 11/25/13  Glucose, capillary  Result Value Ref Range   Glucose-Capillary 158 (H) 70 - 99 mg/dL  I-STAT, chem 8  Result Value Ref Range   Sodium 140 137 - 147 mEq/L  Potassium 3.9 3.7 - 5.3 mEq/L   Chloride 101 96 - 112 mEq/L   BUN 28 (H) 6 - 23 mg/dL   Creatinine, Ser 1.50 (H) 0.50 - 1.10 mg/dL   Glucose, Bld 136 (H) 70 - 99 mg/dL   Calcium, Ion 1.24 1.13 - 1.30 mmol/L   TCO2 26 0 - 100 mmol/L   Hemoglobin 14.3 12.0 - 15.0 g/dL   HCT 42.0 36.0 - 46.0 %      Assessment & Plan:   Problem List Items Addressed This Visit      Unprioritized   GERD (gastroesophageal reflux disease)    Not controlled with Ranitidine.  Prilosec on med list but she could not afford it earlier.   Will try again      Relevant Medications   omeprazole (PRILOSEC) 20 MG capsule    Other Visit Diagnoses    Cough    -  Primary    Will get chest x-ray.  ? reflux contributing or a cause?  Will rx Omeprazole.      Relevant Medications    omeprazole (PRILOSEC) 20 MG capsule    Other Relevant Orders    DG Chest 2 View        Follow up plan: Return in about 4 weeks (around 02/13/2015) for f/u and DM visit.

## 2015-01-16 NOTE — Assessment & Plan Note (Signed)
Not controlled with Ranitidine.  Prilosec on med list but she could not afford it earlier.  Will try again

## 2015-01-16 NOTE — Patient Instructions (Addendum)
Start Omeprazole daily.  Get chest x-ray in [place] Gastroesophageal Reflux Disease, Adult Gastroesophageal reflux disease (GERD) happens when acid from your stomach flows up into the esophagus. When acid comes in contact with the esophagus, the acid causes soreness (inflammation) in the esophagus. Over time, GERD may create small holes (ulcers) in the lining of the esophagus. CAUSES   Increased body weight. This puts pressure on the stomach, making acid rise from the stomach into the esophagus.  Smoking. This increases acid production in the stomach.  Drinking alcohol. This causes decreased pressure in the lower esophageal sphincter (valve or ring of muscle between the esophagus and stomach), allowing acid from the stomach into the esophagus.  Late evening meals and a full stomach. This increases pressure and acid production in the stomach.  A malformed lower esophageal sphincter. Sometimes, no cause is found. SYMPTOMS   Burning pain in the lower part of the mid-chest behind the breastbone and in the mid-stomach area. This may occur twice a week or more often.  Trouble swallowing.  Sore throat.  Dry cough.  Asthma-like symptoms including chest tightness, shortness of breath, or wheezing. DIAGNOSIS  Your caregiver may be able to diagnose GERD based on your symptoms. In some cases, X-rays and other tests may be done to check for complications or to check the condition of your stomach and esophagus. TREATMENT  Your caregiver may recommend over-the-counter or prescription medicines to help decrease acid production. Ask your caregiver before starting or adding any new medicines.  HOME CARE INSTRUCTIONS   Change the factors that you can control. Ask your caregiver for guidance concerning weight loss, quitting smoking, and alcohol consumption.  Avoid foods and drinks that make your symptoms worse, such as:  Caffeine or alcoholic drinks.  Chocolate.  Peppermint or mint  flavorings.  Garlic and onions.  Spicy foods.  Citrus fruits, such as oranges, lemons, or limes.  Tomato-based foods such as sauce, chili, salsa, and pizza.  Fried and fatty foods.  Avoid lying down for the 3 hours prior to your bedtime or prior to taking a nap.  Eat small, frequent meals instead of large meals.  Wear loose-fitting clothing. Do not wear anything tight around your waist that causes pressure on your stomach.  Raise the head of your bed 6 to 8 inches with wood blocks to help you sleep. Extra pillows will not help.  Only take over-the-counter or prescription medicines for pain, discomfort, or fever as directed by your caregiver.  Do not take aspirin, ibuprofen, or other nonsteroidal anti-inflammatory drugs (NSAIDs). SEEK IMMEDIATE MEDICAL CARE IF:   You have pain in your arms, neck, jaw, teeth, or back.  Your pain increases or changes in intensity or duration.  You develop nausea, vomiting, or sweating (diaphoresis).  You develop shortness of breath, or you faint.  Your vomit is green, yellow, black, or looks like coffee grounds or blood.  Your stool is red, bloody, or black. These symptoms could be signs of other problems, such as heart disease, gastric bleeding, or esophageal bleeding. MAKE SURE YOU:   Understand these instructions.  Will watch your condition.  Will get help right away if you are not doing well or get worse. Document Released: 03/19/2005 Document Revised: 09/01/2011 Document Reviewed: 12/27/2010 [facility] Patient Information 2015 [place], [place]. This information is not intended to replace advice given to you by your health care provider. Make sure you discuss any questions you have with your health care provider.

## 2015-01-22 ENCOUNTER — Ambulatory Visit
Admission: RE | Admit: 2015-01-22 | Discharge: 2015-01-22 | Disposition: A | Discharge status: Home / Self Care | Payer: Medicare Other | Source: Ambulatory Visit | Attending: Unknown Physician Specialty | Admitting: Unknown Physician Specialty

## 2015-01-22 ENCOUNTER — Other Ambulatory Visit: Payer: Self-pay | Admitting: Unknown Physician Specialty

## 2015-01-22 DIAGNOSIS — R059 Cough, unspecified: Secondary | ICD-10-CM

## 2015-01-22 DIAGNOSIS — R05 Cough: Secondary | ICD-10-CM

## 2015-01-29 ENCOUNTER — Telehealth: Payer: Self-pay | Admitting: Unknown Physician Specialty

## 2015-01-29 ENCOUNTER — Other Ambulatory Visit: Payer: Self-pay | Admitting: Unknown Physician Specialty

## 2015-01-29 DIAGNOSIS — R9389 Abnormal findings on diagnostic imaging of other specified body structures: Secondary | ICD-10-CM

## 2015-01-31 NOTE — Telephone Encounter (Signed)
Routing to provider  

## 2015-01-31 NOTE — Telephone Encounter (Signed)
Yes it is scheduled for tomorrow.

## 2015-01-31 NOTE — Telephone Encounter (Signed)
I wrote an order for one without contrast.  Did that get ordered?

## 2015-02-01 ENCOUNTER — Ambulatory Visit: Payer: Medicare Other

## 2015-02-01 ENCOUNTER — Ambulatory Visit
Admission: RE | Admit: 2015-02-01 | Discharge: 2015-02-01 | Disposition: A | Discharge status: Home / Self Care | Payer: Medicare Other | Source: Ambulatory Visit | Attending: Unknown Physician Specialty | Admitting: Unknown Physician Specialty

## 2015-02-01 DIAGNOSIS — R9389 Abnormal findings on diagnostic imaging of other specified body structures: Secondary | ICD-10-CM

## 2015-02-01 DIAGNOSIS — R938 Abnormal findings on diagnostic imaging of other specified body structures: Secondary | ICD-10-CM | POA: Diagnosis not present

## 2015-02-01 DIAGNOSIS — I2510 Atherosclerotic heart disease of native coronary artery without angina pectoris: Secondary | ICD-10-CM | POA: Diagnosis not present

## 2015-02-01 DIAGNOSIS — R918 Other nonspecific abnormal finding of lung field: Secondary | ICD-10-CM | POA: Diagnosis not present

## 2015-02-02 ENCOUNTER — Ambulatory Visit: Payer: Medicare Other

## 2015-02-05 ENCOUNTER — Other Ambulatory Visit: Payer: Self-pay | Admitting: Unknown Physician Specialty

## 2015-02-05 ENCOUNTER — Telehealth: Payer: Self-pay | Admitting: Unknown Physician Specialty

## 2015-02-05 DIAGNOSIS — I2510 Atherosclerotic heart disease of native coronary artery without angina pectoris: Secondary | ICD-10-CM

## 2015-02-05 DIAGNOSIS — H25812 Combined forms of age-related cataract, left eye: Secondary | ICD-10-CM | POA: Diagnosis not present

## 2015-02-05 DIAGNOSIS — H2512 Age-related nuclear cataract, left eye: Secondary | ICD-10-CM | POA: Diagnosis not present

## 2015-02-05 DIAGNOSIS — Z961 Presence of intraocular lens: Secondary | ICD-10-CM | POA: Diagnosis not present

## 2015-02-05 NOTE — Telephone Encounter (Signed)
Routing to provider. Did you call this patient as well?

## 2015-02-05 NOTE — Telephone Encounter (Signed)
Pt was returning a call from last week. She was out of town so she was not able to get the results. She would like a call back at the number provided.

## 2015-02-13 ENCOUNTER — Ambulatory Visit: Payer: Medicare Other | Admitting: Unknown Physician Specialty

## 2015-02-13 DIAGNOSIS — R05 Cough: Secondary | ICD-10-CM | POA: Diagnosis not present

## 2015-02-13 DIAGNOSIS — I208 Other forms of angina pectoris: Secondary | ICD-10-CM | POA: Diagnosis not present

## 2015-02-13 DIAGNOSIS — I2510 Atherosclerotic heart disease of native coronary artery without angina pectoris: Secondary | ICD-10-CM | POA: Diagnosis not present

## 2015-02-13 DIAGNOSIS — R0602 Shortness of breath: Secondary | ICD-10-CM | POA: Diagnosis not present

## 2015-02-20 ENCOUNTER — Encounter: Payer: Self-pay | Admitting: Unknown Physician Specialty

## 2015-02-20 ENCOUNTER — Ambulatory Visit (INDEPENDENT_AMBULATORY_CARE_PROVIDER_SITE_OTHER): Payer: Medicare Other | Admitting: Unknown Physician Specialty

## 2015-02-20 VITALS — BP 151/77 | HR 97 | Temp 98.4°F | Ht 61.5 in | Wt 183.4 lb

## 2015-02-20 DIAGNOSIS — N182 Chronic kidney disease, stage 2 (mild): Secondary | ICD-10-CM | POA: Diagnosis not present

## 2015-02-20 DIAGNOSIS — E785 Hyperlipidemia, unspecified: Secondary | ICD-10-CM | POA: Diagnosis not present

## 2015-02-20 DIAGNOSIS — N185 Chronic kidney disease, stage 5: Secondary | ICD-10-CM | POA: Diagnosis not present

## 2015-02-20 DIAGNOSIS — N189 Chronic kidney disease, unspecified: Secondary | ICD-10-CM | POA: Diagnosis not present

## 2015-02-20 DIAGNOSIS — I129 Hypertensive chronic kidney disease with stage 1 through stage 4 chronic kidney disease, or unspecified chronic kidney disease: Secondary | ICD-10-CM

## 2015-02-20 DIAGNOSIS — N183 Chronic kidney disease, stage 3 (moderate): Secondary | ICD-10-CM

## 2015-02-20 DIAGNOSIS — E1122 Type 2 diabetes mellitus with diabetic chronic kidney disease: Secondary | ICD-10-CM | POA: Diagnosis not present

## 2015-02-20 DIAGNOSIS — N181 Chronic kidney disease, stage 1: Secondary | ICD-10-CM | POA: Diagnosis not present

## 2015-02-20 DIAGNOSIS — K219 Gastro-esophageal reflux disease without esophagitis: Secondary | ICD-10-CM | POA: Diagnosis not present

## 2015-02-20 DIAGNOSIS — N184 Chronic kidney disease, stage 4 (severe): Secondary | ICD-10-CM | POA: Diagnosis not present

## 2015-02-20 LAB — MICROALBUMIN, URINE WAIVED
Creatinine, Urine Waived: 200 mg/dL (ref 10–300)
Microalb, Ur Waived: 30 mg/L — ABNORMAL HIGH (ref 0–19)
Microalb/Creat Ratio: <30 mg/g (ref <30)

## 2015-02-20 LAB — LIPID PANEL PICCOLO, WAIVED
CHOL/HDL RATIO PICCOLO,WAIVE: 3.7 mg/dL
CHOLESTEROL PICCOLO, WAIVED: 189 mg/dL (ref <200)
HDL Chol Piccolo, Waived: 51 mg/dL — ABNORMAL LOW (ref >59)
LDL Chol Calc Piccolo Waived: 119 mg/dL — ABNORMAL HIGH (ref <100)
Triglycerides Piccolo,Waived: 95 mg/dL (ref <150)
VLDL Chol Calc Piccolo,Waive: 19 mg/dL (ref <30)

## 2015-02-20 LAB — BAYER DCA HB A1C WAIVED: HB A1C (BAYER DCA - WAIVED): 7 % — ABNORMAL HIGH (ref <7.0)

## 2015-02-20 NOTE — Progress Notes (Signed)
BP 151/77 mmHg  Pulse 97  Temp(Src) 98.4 F (36.9 C)  Ht 5' 1.5" (1.562 m)  Wt 183 lb 6.4 oz (83.19 kg)  BMI 34.10 kg/m2  SpO2 96%  LMP  (LMP Unknown)   Subjective:    Patient ID: Tina Mcdonald, female    DOB: Jan 30, 1941, 74 y.o.   MRN: [id number]  HPI: Tina Mcdonald is a 74 y.o. female  Chief Complaint  Patient presents with  . Follow-up    pt is following up for cough, diarrhea, and gerd  . Diabetes    Diabetes Mellitus: She takes Metformin 500mg  twice daily, states she is faithful with her medication. Does not monitor blood sugars on a regular basis. Denies polydipsia, change in urination pattern or frequency. Denies numbness or sensation changes. Denies diplopia or blurred vision.   Hypertension: Blood pressure medication was changed one week ago by the cardiologist. Denies monitoring blood pressure at home. Denies headaches, fatigue or shortness of breath.  GERD: Started taking Prilosec about 2 weeks ago and reports improvement of symptoms. She is no longer being woken through the night. She reports she is still coughing.  Cough: Reviewed chest x-ray and CT, all results are negative with exception of atherosclerosis. Following up with cardiologist. She is scheduled for a stress test and ECHO this week.  Relevant past medical, surgical, family and social history reviewed and updated as indicated. Interim medical history since our last visit reviewed. Allergies and medications reviewed and updated.  Review of Systems  Constitutional: Negative.  Negative for fever, activity change, appetite change and fatigue.  HENT: Negative.  Negative for congestion and sore throat.   Respiratory: Positive for cough. Negative for chest tightness, shortness of breath, wheezing and stridor.   Cardiovascular: Negative.  Negative for chest pain, palpitations and leg swelling.  Skin: Negative.  Negative for color change, pallor, rash and wound.  Psychiatric/Behavioral: Negative.   Negative for behavioral problems, confusion, sleep disturbance and agitation.    Per HPI unless specifically indicated above     Objective:    BP 151/77 mmHg  Pulse 97  Temp(Src) 98.4 F (36.9 C)  Ht 5' 1.5" (1.562 m)  Wt 183 lb 6.4 oz (83.19 kg)  BMI 34.10 kg/m2  SpO2 96%  LMP  (LMP Unknown)    Physical Exam  Constitutional: She is oriented to person, place, and time. She appears well-developed and well-nourished. No distress.  HENT:  Head: Normocephalic and atraumatic.  Neck: Normal range of motion.  Cardiovascular: Normal rate, regular rhythm and normal heart sounds.  Exam reveals no gallop and no friction rub.   No murmur heard. Pulmonary/Chest: Effort normal and breath sounds normal. No respiratory distress. She has no wheezes. She has no rales. She exhibits no tenderness.  Musculoskeletal: Normal range of motion.  Neurological: She is alert and oriented to person, place, and time.  Skin: Skin is warm and dry. No rash noted. She is not diaphoretic. No erythema. No pallor.  Psychiatric: She has a normal mood and affect. Her behavior is normal. Judgment and thought content normal.       Assessment & Plan:   Problem List Items Addressed This Visit      Unprioritized   Diabetes mellitus with stage 3 chronic kidney disease    A1C today is 7.0 whicn is much improved.  Continue present medicatons.        Relevant Medications   metFORMIN (GLUMETZA) 500 MG (MOD) 24 hr tablet   Hyperlipidemia  Reviewed lipid panel.  Stable, continue present medications.       Relevant Medications   cloNIDine (CATAPRES) 0.1 MG tablet   Other Relevant Orders   Lipid Panel Piccolo, Waived   Hypertensive CKD (chronic kidney disease) - Primary    Stable, continue present medications.       Relevant Orders   Comprehensive metabolic panel   GERD (gastroesophageal reflux disease)    Now taking Prilosec and no longer being woken through the night.      CKD (chronic kidney disease)     Await CMP.  Consider Nephrology referral      Relevant Orders   Comprehensive metabolic panel       Follow up plan: Return in about 3 months (around 05/23/2015).

## 2015-02-20 NOTE — Assessment & Plan Note (Signed)
Reviewed lipid panel.  Stable, continue present medications.

## 2015-02-20 NOTE — Assessment & Plan Note (Addendum)
A1C today is 7.0 whicn is much improved.  Continue present medicatons.

## 2015-02-20 NOTE — Assessment & Plan Note (Addendum)
Stable, continue present medications.   

## 2015-02-20 NOTE — Assessment & Plan Note (Signed)
Cardiologist stopped losartan and initiated Clonidine 0.1mg  two times daily

## 2015-02-20 NOTE — Assessment & Plan Note (Signed)
Await CMP.  Consider Nephrology referral

## 2015-02-20 NOTE — Assessment & Plan Note (Signed)
Now taking Prilosec and no longer being woken through the night.

## 2015-02-21 LAB — COMPREHENSIVE METABOLIC PANEL
ALT: 11 IU/L (ref 0–32)
AST: 11 IU/L (ref 0–40)
Albumin/Globulin Ratio: 1.6 (ref 1.1–2.5)
Albumin: 4.1 g/dL (ref 3.5–4.8)
Alkaline Phosphatase: 50 IU/L (ref 39–117)
BILIRUBIN TOTAL: 0.3 mg/dL (ref 0.0–1.2)
BUN/Creatinine Ratio: 20 (ref 11–26)
BUN: 18 mg/dL (ref 8–27)
CHLORIDE: 101 mmol/L (ref 97–108)
CO2: 23 mmol/L (ref 18–29)
Calcium: 9.5 mg/dL (ref 8.7–10.3)
Creatinine, Ser: 0.91 mg/dL (ref 0.57–1.00)
GFR calc Af Amer: 73 mL/min/{1.73_m2} (ref >59)
GFR calc non Af Amer: 63 mL/min/{1.73_m2} (ref >59)
Globulin, Total: 2.5 g/dL (ref 1.5–4.5)
Glucose: 93 mg/dL (ref 65–99)
Potassium: 4.1 mmol/L (ref 3.5–5.2)
Sodium: 142 mmol/L (ref 134–144)
Total Protein: 6.6 g/dL (ref 6.0–8.5)

## 2015-02-21 LAB — URIC ACID: Uric Acid: 6.4 mg/dL (ref 2.5–7.1)

## 2015-02-22 DIAGNOSIS — R0602 Shortness of breath: Secondary | ICD-10-CM | POA: Diagnosis not present

## 2015-02-22 DIAGNOSIS — I208 Other forms of angina pectoris: Secondary | ICD-10-CM | POA: Diagnosis not present

## 2015-02-22 DIAGNOSIS — I2510 Atherosclerotic heart disease of native coronary artery without angina pectoris: Secondary | ICD-10-CM | POA: Diagnosis not present

## 2015-03-12 DIAGNOSIS — E784 Other hyperlipidemia: Secondary | ICD-10-CM | POA: Diagnosis not present

## 2015-03-12 DIAGNOSIS — I10 Essential (primary) hypertension: Secondary | ICD-10-CM | POA: Diagnosis not present

## 2015-03-12 DIAGNOSIS — K219 Gastro-esophageal reflux disease without esophagitis: Secondary | ICD-10-CM | POA: Diagnosis not present

## 2015-03-12 DIAGNOSIS — E119 Type 2 diabetes mellitus without complications: Secondary | ICD-10-CM | POA: Diagnosis not present

## 2015-03-12 DIAGNOSIS — I209 Angina pectoris, unspecified: Secondary | ICD-10-CM | POA: Diagnosis not present

## 2015-03-14 ENCOUNTER — Ambulatory Visit (INDEPENDENT_AMBULATORY_CARE_PROVIDER_SITE_OTHER): Payer: Medicare Other | Admitting: Unknown Physician Specialty

## 2015-03-14 ENCOUNTER — Ambulatory Visit: Payer: Medicare Other | Admitting: Unknown Physician Specialty

## 2015-03-14 ENCOUNTER — Encounter: Payer: Self-pay | Admitting: Unknown Physician Specialty

## 2015-03-14 VITALS — BP 147/82 | HR 86 | Temp 98.4°F | Ht 62.0 in | Wt 184.2 lb

## 2015-03-14 DIAGNOSIS — R1011 Right upper quadrant pain: Secondary | ICD-10-CM

## 2015-03-14 NOTE — Progress Notes (Signed)
BP 147/82 mmHg  Pulse 86  Temp(Src) 98.4 F (36.9 C)  Ht 5\' 2"  (1.575 m)  Wt 184 lb 3.2 oz (83.553 kg)  BMI 33.68 kg/m2  SpO2 97%  LMP  (LMP Unknown)   Subjective:    Patient ID: Tina Mcdonald, female    DOB: [birth year], 74 y.o.   MRN: [id number]  HPI: Tina Mcdonald is a 74 y.o. female  Chief Complaint  Patient presents with  . Abdominal Pain    pt states she has pain in upper right quad of stomach, states it starts aroud rib cage and goes around to her back and up behind her right shoulder. States it is not a sharp pain, but feels more like pressure. States pain started about 3 weeks ago and states it comes and goes   States nothing makes it better or worse.  Episodes last 3-5 minutes "anytime" of the day.  "I've had it 3 times since I've been here.  Describes the pain as pressure mostly with movement.      Relevant past medical, surgical, family and social history reviewed and updated as indicated. Interim medical history since our last visit reviewed. Allergies and medications reviewed and updated.  Review of Systems  Constitutional: Negative.        Hot flashes  Respiratory: Positive for shortness of breath.   Gastrointestinal: Positive for abdominal pain and diarrhea. Negative for nausea, vomiting and constipation.  Neurological: Negative.   Psychiatric/Behavioral: Negative.     Per HPI unless specifically indicated above     Objective:    BP 147/82 mmHg  Pulse 86  Temp(Src) 98.4 F (36.9 C)  Ht 5\' 2"  (1.575 m)  Wt 184 lb 3.2 oz (83.553 kg)  BMI 33.68 kg/m2  SpO2 97%  LMP  (LMP Unknown)  Wt Readings from Last 3 Encounters:  03/14/15 184 lb 3.2 oz (83.553 kg)  02/20/15 183 lb 6.4 oz (83.19 kg)  01/16/15 183 lb 9.6 oz (83.28 kg)    Physical Exam  Constitutional: She is oriented to person, place, and time. She appears well-developed and well-nourished. No distress.  HENT:  Head: Normocephalic and atraumatic.  Eyes: Conjunctivae and lids are normal.  Right eye exhibits no discharge. Left eye exhibits no discharge. No scleral icterus.  Cardiovascular: Normal rate and regular rhythm.   Pulmonary/Chest: Effort normal. No respiratory distress.  Abdominal: Normal appearance and bowel sounds are normal. She exhibits no distension. There is no splenomegaly or hepatomegaly. There is no tenderness. There is no rebound and no CVA tenderness.  Musculoskeletal: Normal range of motion.  Neurological: She is alert and oriented to person, place, and time.  Skin: Skin is intact. No rash noted. No pallor.  Psychiatric: She has a normal mood and affect. Her behavior is normal. Judgment and thought content normal.    Results for orders placed or performed in visit on 02/20/15  Comprehensive metabolic panel  Result Value Ref Range   Glucose 93 65 - 99 mg/dL   BUN 18 8 - 27 mg/dL   Creatinine, Ser 0.91 0.57 - 1.00 mg/dL   GFR calc non Af Amer 63 >59 mL/min/1.73   GFR calc Af Amer 73 >59 mL/min/1.73   BUN/Creatinine Ratio 20 11 - 26   Sodium 142 134 - 144 mmol/L   Potassium 4.1 3.5 - 5.2 mmol/L   Chloride 101 97 - 108 mmol/L   CO2 23 18 - 29 mmol/L   Calcium 9.5 8.7 - 10.3 mg/dL   Total  Protein 6.6 6.0 - 8.5 g/dL   Albumin 4.1 3.5 - 4.8 g/dL   Globulin, Total 2.5 1.5 - 4.5 g/dL   Albumin/Globulin Ratio 1.6 1.1 - 2.5   Bilirubin Total 0.3 0.0 - 1.2 mg/dL   Alkaline Phosphatase 50 39 - 117 IU/L   AST 11 0 - 40 IU/L   ALT 11 0 - 32 IU/L  Bayer DCA Hb A1c Waived  Result Value Ref Range   Bayer DCA Hb A1c Waived 7.0 (H) <7.0 %  Lipid Panel Piccolo, Waived  Result Value Ref Range   Cholesterol Piccolo, Waived 189 <200 mg/dL   HDL Chol Piccolo, Waived 51 (L) >59 mg/dL   Triglycerides Piccolo,Waived 95 <150 mg/dL   Chol/HDL Ratio Piccolo,Waive 3.7 mg/dL   LDL Chol Calc Piccolo Waived 119 (H) <100 mg/dL   VLDL Chol Calc Piccolo,Waive 19 <30 mg/dL  Microalbumin, Urine Waived  Result Value Ref Range   Microalb, Ur Waived 30 (H) 0 - 19 mg/L    Creatinine, Urine Waived 200 10 - 300 mg/dL   Microalb/Creat Ratio <30 <30 mg/g  Uric acid  Result Value Ref Range   Uric Acid 6.4 2.5 - 7.1 mg/dL      Assessment & Plan:   Problem List Items Addressed This Visit    None    Visit Diagnoses    Right upper quadrant pain    -  Primary    Will Check CBC Amylase, and Lipase.  Check [place] to r/o gall bladder or liver disease. No need for pain medications.      Relevant Orders    CBC with Differential/Platelet    Lipase    Amylase    US Abdomen Limited RUQ        Follow up plan: No Follow-up on file.

## 2015-03-15 LAB — CBC WITH DIFFERENTIAL/PLATELET
BASOS ABS: 0 10*3/uL (ref 0.0–0.2)
BASOS: 0 %
EOS (ABSOLUTE): 0.1 10*3/uL (ref 0.0–0.4)
Eos: 2 %
Hematocrit: 33.3 % — ABNORMAL LOW (ref 34.0–46.6)
Hemoglobin: 10.5 g/dL — ABNORMAL LOW (ref 11.1–15.9)
IMMATURE GRANS (ABS): 0 10*3/uL (ref 0.0–0.1)
Immature Granulocytes: 0 %
LYMPHS: 28 %
Lymphocytes Absolute: 1.8 10*3/uL (ref 0.7–3.1)
MCH: 23.4 pg — AB (ref 26.6–33.0)
MCHC: 31.5 g/dL (ref 31.5–35.7)
MCV: 74 fL — AB (ref 79–97)
MONOS ABS: 0.4 10*3/uL (ref 0.1–0.9)
Monocytes: 7 %
NEUTROS ABS: 3.9 10*3/uL (ref 1.4–7.0)
Neutrophils: 63 %
PLATELETS: 264 10*3/uL (ref 150–379)
RBC: 4.49 x10E6/uL (ref 3.77–5.28)
RDW: 15.8 % — AB (ref 12.3–15.4)
WBC: 6.3 10*3/uL (ref 3.4–10.8)

## 2015-03-15 LAB — AMYLASE: Amylase: 77 U/L (ref 31–124)

## 2015-03-15 LAB — LIPASE: LIPASE: 32 U/L (ref 0–59)

## 2015-03-19 ENCOUNTER — Other Ambulatory Visit: Payer: Self-pay | Admitting: Unknown Physician Specialty

## 2015-03-19 DIAGNOSIS — D649 Anemia, unspecified: Secondary | ICD-10-CM

## 2015-03-20 ENCOUNTER — Other Ambulatory Visit: Payer: Medicare Other

## 2015-03-20 DIAGNOSIS — D649 Anemia, unspecified: Secondary | ICD-10-CM

## 2015-03-21 ENCOUNTER — Telehealth: Payer: Self-pay | Admitting: Unknown Physician Specialty

## 2015-03-21 LAB — ANEMIA PANEL
Ferritin: 86 ng/mL (ref 15–150)
Hematocrit: 36.2 % (ref 34.0–46.6)
IRON SATURATION: 16 % (ref 15–55)
IRON: 40 ug/dL (ref 27–139)
Retic Ct Pct: 1.9 % (ref 0.6–2.6)
TIBC: 250 ug/dL (ref 250–450)
UIBC: 210 ug/dL (ref 118–369)
Vitamin B-12: 160 pg/mL — ABNORMAL LOW (ref 211–946)

## 2015-03-21 MED ORDER — CYANOCOBALAMIN 1000 MCG/ML IJ SOLN: 1000.0000 ug | Freq: Once | INTRAMUSCULAR | Status: DC

## 2015-03-21 NOTE — Telephone Encounter (Signed)
Discussed with patient low B12.  She elects for B12 injections.  She needs one weekly for 4 weeks and then monthly.  States she never got called about the abdominal US.    Tiffany, do you know the status of this?

## 2015-03-21 NOTE — Telephone Encounter (Signed)
[facility]

## 2015-03-22 ENCOUNTER — Ambulatory Visit (INDEPENDENT_AMBULATORY_CARE_PROVIDER_SITE_OTHER): Payer: Medicare Other

## 2015-03-22 DIAGNOSIS — E538 Deficiency of other specified B group vitamins: Secondary | ICD-10-CM

## 2015-03-22 MED ORDER — CYANOCOBALAMIN 1000 MCG/ML IJ SOLN
1000.0000 ug | Freq: Once | INTRAMUSCULAR | Status: AC
  Administered 2015-03-22: 1000 ug via INTRAMUSCULAR

## 2015-03-23 ENCOUNTER — Ambulatory Visit
Admission: RE | Admit: 2015-03-23 | Discharge: 2015-03-23 | Disposition: A | Discharge status: Home / Self Care | Payer: Medicare Other | Source: Ambulatory Visit | Attending: Unknown Physician Specialty | Admitting: Unknown Physician Specialty

## 2015-03-23 ENCOUNTER — Telehealth: Payer: Self-pay | Admitting: Unknown Physician Specialty

## 2015-03-23 DIAGNOSIS — K760 Fatty (change of) liver, not elsewhere classified: Secondary | ICD-10-CM | POA: Insufficient documentation

## 2015-03-23 DIAGNOSIS — R1011 Right upper quadrant pain: Secondary | ICD-10-CM

## 2015-03-23 NOTE — Telephone Encounter (Signed)
Discussed with pt that [place] does not explain source of pain.  Suspect muculoskeletal cause.

## 2015-03-30 ENCOUNTER — Ambulatory Visit (INDEPENDENT_AMBULATORY_CARE_PROVIDER_SITE_OTHER): Payer: Medicare Other

## 2015-03-30 DIAGNOSIS — E538 Deficiency of other specified B group vitamins: Secondary | ICD-10-CM | POA: Diagnosis not present

## 2015-03-30 MED ORDER — CYANOCOBALAMIN 1000 MCG/ML IJ SOLN
1000.0000 ug | Freq: Once | INTRAMUSCULAR | Status: AC
  Administered 2015-03-30: 1000 ug via INTRAMUSCULAR

## 2015-04-03 DIAGNOSIS — Z961 Presence of intraocular lens: Secondary | ICD-10-CM | POA: Diagnosis not present

## 2015-04-06 ENCOUNTER — Ambulatory Visit (INDEPENDENT_AMBULATORY_CARE_PROVIDER_SITE_OTHER): Payer: Medicare Other

## 2015-04-06 ENCOUNTER — Other Ambulatory Visit: Payer: Self-pay | Admitting: Unknown Physician Specialty

## 2015-04-06 DIAGNOSIS — E538 Deficiency of other specified B group vitamins: Secondary | ICD-10-CM | POA: Diagnosis not present

## 2015-04-06 MED ORDER — CYANOCOBALAMIN 1000 MCG/ML IJ SOLN
1000.0000 ug | Freq: Once | INTRAMUSCULAR | Status: AC
  Administered 2015-04-06: 1000 ug via INTRAMUSCULAR

## 2015-04-10 ENCOUNTER — Other Ambulatory Visit: Payer: Self-pay | Admitting: Unknown Physician Specialty

## 2015-04-12 ENCOUNTER — Ambulatory Visit: Payer: Medicare Other

## 2015-04-13 ENCOUNTER — Ambulatory Visit: Payer: Medicare Other

## 2015-04-16 ENCOUNTER — Ambulatory Visit (INDEPENDENT_AMBULATORY_CARE_PROVIDER_SITE_OTHER): Payer: Medicare Other

## 2015-04-16 DIAGNOSIS — E538 Deficiency of other specified B group vitamins: Secondary | ICD-10-CM

## 2015-04-16 MED ORDER — CYANOCOBALAMIN 1000 MCG/ML IJ SOLN
1000.0000 ug | Freq: Once | INTRAMUSCULAR | Status: AC
  Administered 2015-04-16: 1000 ug via INTRAMUSCULAR

## 2015-04-17 DIAGNOSIS — Z961 Presence of intraocular lens: Secondary | ICD-10-CM | POA: Diagnosis not present

## 2015-05-02 ENCOUNTER — Other Ambulatory Visit: Payer: Self-pay | Admitting: Unknown Physician Specialty

## 2015-05-04 ENCOUNTER — Other Ambulatory Visit: Payer: Self-pay

## 2015-05-04 ENCOUNTER — Telehealth: Payer: Self-pay

## 2015-05-04 MED ORDER — METFORMIN HCL 500 MG PO TABS: 1000.0000 mg | ORAL_TABLET | Freq: Two times a day (BID) | ORAL | Status: DC

## 2015-05-04 MED ORDER — METFORMIN HCL ER 500 MG PO TB24: 1000.0000 mg | ORAL_TABLET | Freq: Two times a day (BID) | ORAL | Status: DC

## 2015-05-04 MED ORDER — METFORMIN HCL ER (OSM) 500 MG PO TB24: 500.0000 mg | ORAL_TABLET | Freq: Every day | ORAL | Status: DC

## 2015-05-04 MED ORDER — METFORMIN HCL ER (MOD) 500 MG PO TB24: 1000.0000 mg | ORAL_TABLET | Freq: Two times a day (BID) | ORAL | Status: DC

## 2015-05-04 NOTE — Telephone Encounter (Signed)
Pharmacy called and stated that the glumetza that was written this morning is coming up around $17,000.00. They also state that it wants a prior-auth but they doubt it will work. They want to know if we can write a new rx for the Metformin ER 500mg . Stated patient was taking 4 tablets daily. Pharmacy is [facility] in [place].

## 2015-05-04 NOTE — Telephone Encounter (Signed)
Patient was last seen 03/14/15 and has appointment 05/23/15. Pharmacy is [facility] in [place].

## 2015-05-07 NOTE — Telephone Encounter (Signed)
Spoke to Tina Mcdonald about this in person on Friday. She re-sent the prescription to the pharmacy the correct way.

## 2015-05-23 ENCOUNTER — Ambulatory Visit (INDEPENDENT_AMBULATORY_CARE_PROVIDER_SITE_OTHER): Payer: Medicare Other | Admitting: Unknown Physician Specialty

## 2015-05-23 ENCOUNTER — Encounter: Payer: Self-pay | Admitting: Unknown Physician Specialty

## 2015-05-23 VITALS — BP 158/86 | HR 85 | Temp 98.7°F | Ht 62.0 in | Wt 179.4 lb

## 2015-05-23 DIAGNOSIS — E1122 Type 2 diabetes mellitus with diabetic chronic kidney disease: Secondary | ICD-10-CM

## 2015-05-23 DIAGNOSIS — N183 Chronic kidney disease, stage 3 (moderate): Secondary | ICD-10-CM

## 2015-05-23 DIAGNOSIS — I10 Essential (primary) hypertension: Secondary | ICD-10-CM

## 2015-05-23 DIAGNOSIS — E538 Deficiency of other specified B group vitamins: Secondary | ICD-10-CM

## 2015-05-23 LAB — BAYER DCA HB A1C WAIVED: HB A1C (BAYER DCA - WAIVED): 6.4 % (ref <7.0)

## 2015-05-23 MED ORDER — CYANOCOBALAMIN 1000 MCG/ML IJ SOLN
1000.0000 ug | Freq: Once | INTRAMUSCULAR | Status: AC
  Administered 2015-05-23: 1000 ug via INTRAMUSCULAR

## 2015-05-23 NOTE — Assessment & Plan Note (Signed)
Stable, continue present medications.   

## 2015-05-23 NOTE — Progress Notes (Signed)
BP 158/86 mmHg  Pulse 85  Temp(Src) 98.7 F (37.1 C)  Ht 5\' 2"  (1.575 m)  Wt 179 lb 6.4 oz (81.375 kg)  BMI 32.80 kg/m2  SpO2 95%  LMP  (LMP Unknown)   Subjective:    Patient ID: Tina Mcdonald, female    DOB: 1941/03/11, 74 y.o.   MRN: [id number]  HPI: Tina Mcdonald is a 74 y.o. female  Chief Complaint  Patient presents with  . Diabetes  . Hyperlipidemia  . Hypertension  . Medication Refill    pt states she needs refill on omeprazole, would like 90 day supply   Diabetes:  Using medications without difficulties No hypoglycemic episodes No hyperglycemic episodes Feet problems: none Blood Sugars averaging: Dropping to below 97 and sometimes groggy eye exam within last year and just had surgery on both eyes  Hypertension:  Using medications without difficulty but did not take today Average home BPs   Using medication without problems or lightheadedness No chest pain with exertion or shortness of breath No Edema     Relevant past medical, surgical, family and social history reviewed and updated as indicated. Interim medical history since our last visit reviewed. Allergies and medications reviewed and updated.  Review of Systems  Per HPI unless specifically indicated above     Objective:    BP 158/86 mmHg  Pulse 85  Temp(Src) 98.7 F (37.1 C)  Ht 5\' 2"  (1.575 m)  Wt 179 lb 6.4 oz (81.375 kg)  BMI 32.80 kg/m2  SpO2 95%  LMP  (LMP Unknown)  Wt Readings from Last 3 Encounters:  05/23/15 179 lb 6.4 oz (81.375 kg)  03/14/15 184 lb 3.2 oz (83.553 kg)  02/20/15 183 lb 6.4 oz (83.19 kg)    Physical Exam  Constitutional: She is oriented to person, place, and time. She appears well-developed and well-nourished. No distress.  HENT:  Head: Normocephalic and atraumatic.  Eyes: Conjunctivae and lids are normal. Right eye exhibits no discharge. Left eye exhibits no discharge. No scleral icterus.  Neck: Normal range of motion. Neck supple. No JVD present.  Carotid bruit is not present.  Cardiovascular: Normal rate, regular rhythm and normal heart sounds.   Pulmonary/Chest: Effort normal and breath sounds normal.  Abdominal: Normal appearance. There is no splenomegaly or hepatomegaly.  Musculoskeletal: Normal range of motion.  Neurological: She is alert and oriented to person, place, and time.  Skin: Skin is warm, dry and intact. No rash noted. No pallor.  Psychiatric: She has a normal mood and affect. Her behavior is normal. Judgment and thought content normal.    Results for orders placed or performed in visit on 03/20/15  Anemia panel  Result Value Ref Range   Total Iron Binding Capacity 250 250 - 450 ug/dL   UIBC 210 118 - 369 ug/dL   Iron 40 27 - 139 ug/dL   Iron Saturation 16 15 - 55 %   Vitamin B-12 160 (L) 211 - 946 pg/mL   Folate, Hemolysate CANCELED ng/mL   Hematocrit 36.2 34.0 - 46.6 %   Folate, RBC CANCELED ng/mL   Ferritin 86 15 - 150 ng/mL   Retic Ct Pct 1.9 0.6 - 2.6 %      Assessment & Plan:   Problem List Items Addressed This Visit      Unprioritized   Diabetes mellitus with chronic kidney disease ([place]) - Primary    Hgb A1C is 6.4.  Continue present medications      Hypertension  Stable, continue present medications.        Other Visit Diagnoses    Vitamin B 12 deficiency        Relevant Medications    cyanocobalamin ((VITAMIN B-12)) injection 1,000 mcg (Completed)        Follow up plan: Return in about 6 months (around 11/20/2015).

## 2015-05-23 NOTE — Assessment & Plan Note (Signed)
Hgb A1C is 6.4%..  Continue present medications.   

## 2015-05-24 LAB — COMPREHENSIVE METABOLIC PANEL
A/G RATIO: 1.9 (ref 1.1–2.5)
ALBUMIN: 4.1 g/dL (ref 3.5–4.8)
ALT: 17 IU/L (ref 0–32)
AST: 15 IU/L (ref 0–40)
Alkaline Phosphatase: 70 IU/L (ref 39–117)
BILIRUBIN TOTAL: 0.3 mg/dL (ref 0.0–1.2)
BUN / CREAT RATIO: 21 (ref 11–26)
BUN: 15 mg/dL (ref 8–27)
CALCIUM: 9.4 mg/dL (ref 8.7–10.3)
CHLORIDE: 101 mmol/L (ref 97–106)
CO2: 26 mmol/L (ref 18–29)
Creatinine, Ser: 0.73 mg/dL (ref 0.57–1.00)
GFR, EST AFRICAN AMERICAN: 95 mL/min/{1.73_m2} (ref >59)
GFR, EST NON AFRICAN AMERICAN: 83 mL/min/{1.73_m2} (ref >59)
Globulin, Total: 2.2 g/dL (ref 1.5–4.5)
Glucose: 103 mg/dL — ABNORMAL HIGH (ref 65–99)
Potassium: 3.9 mmol/L (ref 3.5–5.2)
Sodium: 142 mmol/L (ref 136–144)
TOTAL PROTEIN: 6.3 g/dL (ref 6.0–8.5)

## 2015-05-25 ENCOUNTER — Other Ambulatory Visit: Payer: Self-pay | Admitting: Unknown Physician Specialty

## 2015-06-19 ENCOUNTER — Other Ambulatory Visit: Payer: Self-pay | Admitting: Family Medicine

## 2015-06-19 DIAGNOSIS — E538 Deficiency of other specified B group vitamins: Secondary | ICD-10-CM

## 2015-06-19 MED ORDER — CYANOCOBALAMIN 1000 MCG/ML IJ SOLN
1000.0000 ug | INTRAMUSCULAR | Status: AC
  Administered 2015-06-20 – 2015-11-20 (×6): 1000 ug via INTRAMUSCULAR

## 2015-06-20 ENCOUNTER — Ambulatory Visit (INDEPENDENT_AMBULATORY_CARE_PROVIDER_SITE_OTHER): Payer: Medicare Other

## 2015-06-20 DIAGNOSIS — E538 Deficiency of other specified B group vitamins: Secondary | ICD-10-CM | POA: Diagnosis not present

## 2015-07-08 ENCOUNTER — Other Ambulatory Visit: Payer: Self-pay | Admitting: Unknown Physician Specialty

## 2015-07-15 ENCOUNTER — Ambulatory Visit (INDEPENDENT_AMBULATORY_CARE_PROVIDER_SITE_OTHER): Payer: Medicare Other

## 2015-07-15 ENCOUNTER — Encounter: Payer: Self-pay | Admitting: Emergency Medicine

## 2015-07-15 ENCOUNTER — Ambulatory Visit
Admission: EM | Admit: 2015-07-15 | Discharge: 2015-07-15 | Disposition: A | Discharge status: Home / Self Care | Payer: Medicare Other | Attending: Family Medicine | Admitting: Family Medicine

## 2015-07-15 DIAGNOSIS — J069 Acute upper respiratory infection, unspecified: Secondary | ICD-10-CM

## 2015-07-15 DIAGNOSIS — R05 Cough: Secondary | ICD-10-CM | POA: Diagnosis not present

## 2015-07-15 MED ORDER — AZITHROMYCIN 250 MG PO TABS: ORAL_TABLET | ORAL | Status: DC

## 2015-07-15 MED ORDER — HYDROCOD POLST-CPM POLST ER 10-8 MG/5ML PO SUER: 5.0000 mL | Freq: Every evening | ORAL | Status: DC | PRN

## 2015-07-15 NOTE — ED Notes (Signed)
Pt has productive cough since last Saturday. Reports subj. Fever and chills and SOB, voice horse, and chest congested "raw" and headache.

## 2015-07-15 NOTE — ED Provider Notes (Signed)
Patient presents today with symptoms of mild productive cough and SOB, subjective fever, hoarseness of voice, headache. Patient states that she's had the symptoms for the last few days. Patient denies any chest pain. Cough is worse at night. Hasn't taken BP medication today.   ROS: Negative except mentioned above. Vitals as per Epic. GENERAL: NAD HEENT: no pharyngeal erythema, no exudate, no erythema of TMs, no cervical LAD RESP: CTA B CARD: RRR NEURO: CN II-XII grossly intact   A/P: URI- CXR doesn't show any acute infiltrate, Z-pk, Delysm prn, Tussionex at bedtime prn, Claritin prn, rest, hydration, seek medical attention if symptoms persist or worsen. Encouraged compliance with BP medication.   Paulina Fusi, MD 07/15/15 1440

## 2015-07-20 ENCOUNTER — Ambulatory Visit (INDEPENDENT_AMBULATORY_CARE_PROVIDER_SITE_OTHER): Payer: Medicare Other

## 2015-07-20 DIAGNOSIS — E538 Deficiency of other specified B group vitamins: Secondary | ICD-10-CM

## 2015-08-11 ENCOUNTER — Other Ambulatory Visit: Payer: Self-pay | Admitting: Unknown Physician Specialty

## 2015-08-20 ENCOUNTER — Ambulatory Visit (INDEPENDENT_AMBULATORY_CARE_PROVIDER_SITE_OTHER): Payer: Medicare Other

## 2015-08-20 DIAGNOSIS — E538 Deficiency of other specified B group vitamins: Secondary | ICD-10-CM | POA: Diagnosis not present

## 2015-09-04 DIAGNOSIS — E113393 Type 2 diabetes mellitus with moderate nonproliferative diabetic retinopathy without macular edema, bilateral: Secondary | ICD-10-CM | POA: Diagnosis not present

## 2015-09-04 LAB — HM DIABETES EYE EXAM

## 2015-09-17 ENCOUNTER — Ambulatory Visit (INDEPENDENT_AMBULATORY_CARE_PROVIDER_SITE_OTHER): Payer: Medicare Other

## 2015-09-17 DIAGNOSIS — E538 Deficiency of other specified B group vitamins: Secondary | ICD-10-CM

## 2015-09-19 ENCOUNTER — Ambulatory Visit (INDEPENDENT_AMBULATORY_CARE_PROVIDER_SITE_OTHER): Payer: Medicare Other | Admitting: Unknown Physician Specialty

## 2015-09-19 ENCOUNTER — Encounter: Payer: Self-pay | Admitting: Unknown Physician Specialty

## 2015-09-19 VITALS — BP 170/89 | HR 77 | Temp 98.4°F | Ht 60.5 in | Wt 173.8 lb

## 2015-09-19 DIAGNOSIS — R634 Abnormal weight loss: Secondary | ICD-10-CM | POA: Diagnosis not present

## 2015-09-19 DIAGNOSIS — J019 Acute sinusitis, unspecified: Secondary | ICD-10-CM | POA: Diagnosis not present

## 2015-09-19 DIAGNOSIS — E1122 Type 2 diabetes mellitus with diabetic chronic kidney disease: Secondary | ICD-10-CM

## 2015-09-19 DIAGNOSIS — I10 Essential (primary) hypertension: Secondary | ICD-10-CM

## 2015-09-19 DIAGNOSIS — D649 Anemia, unspecified: Secondary | ICD-10-CM | POA: Diagnosis not present

## 2015-09-19 DIAGNOSIS — Z7689 Persons encountering health services in other specified circumstances: Secondary | ICD-10-CM | POA: Diagnosis not present

## 2015-09-19 LAB — BAYER DCA HB A1C WAIVED: HB A1C (BAYER DCA - WAIVED): 6.8 % (ref <7.0)

## 2015-09-19 MED ORDER — HYDROCHLOROTHIAZIDE 25 MG PO TABS: 25.0000 mg | ORAL_TABLET | Freq: Every day | ORAL | Status: DC

## 2015-09-19 MED ORDER — CEFDINIR 300 MG PO CAPS: 300.0000 mg | ORAL_CAPSULE | Freq: Two times a day (BID) | ORAL | Status: DC

## 2015-09-19 NOTE — Assessment & Plan Note (Signed)
High today.  Start HCTZ

## 2015-09-19 NOTE — Progress Notes (Signed)
BP 170/89 mmHg  Pulse 77  Temp(Src) 98.4 F (36.9 C)  Ht 5' 0.5" (1.537 m)  Wt 173 lb 12.8 oz (78.835 kg)  BMI 33.37 kg/m2  SpO2 96%  LMP  (LMP Unknown)   Subjective:    Patient ID: Tina Mcdonald, female    DOB: 26-Mar-1941, 75 y.o.   MRN: [id number]  HPI: JASEY SHELTON is a 75 y.o. female  Chief Complaint  Patient presents with  . URI  . Cough  . Weight Loss   Cough Chronicity: 3 months.  Went to urgent care January.  It was better but now worse again. The problem has been gradually worsening. The cough is productive of purulent sputum. Associated symptoms include headaches and weight loss. Associated symptoms comments: 5 pound weight loss since last visit. She has tried OTC cough suppressant for the symptoms. The treatment provided no relief.  Pt hasn't smoked since 1999 States blood sugar 104-112 Taking B12 shots monthly for Vitamin B12 deficiency Chest x-ray from 06/2015 was normal  Hypertension On Catapress and Metoprolol.  Not checking BP at home.  She does have a headache.  Meds were started by Dr. Clayborn Bigness at one time  Relevant past medical, surgical, family and social history reviewed and updated as indicated. Interim medical history since our last visit reviewed. Allergies and medications reviewed and updated.  Review of Systems  Constitutional: Positive for weight loss.  Respiratory: Positive for cough.   Gastrointestinal: Positive for diarrhea.  Neurological: Positive for headaches.    Per HPI unless specifically indicated above     Objective:    BP 170/89 mmHg  Pulse 77  Temp(Src) 98.4 F (36.9 C)  Ht 5' 0.5" (1.537 m)  Wt 173 lb 12.8 oz (78.835 kg)  BMI 33.37 kg/m2  SpO2 96%  LMP  (LMP Unknown)  Wt Readings from Last 3 Encounters:  09/19/15 173 lb 12.8 oz (78.835 kg)  07/15/15 180 lb (81.647 kg)  05/23/15 179 lb 6.4 oz (81.375 kg)    Physical Exam  Constitutional: She is oriented to person, place, and time. She appears well-developed  and well-nourished. No distress.  HENT:  Head: Normocephalic and atraumatic.  Right Ear: Tympanic membrane and ear canal normal.  Left Ear: Tympanic membrane and ear canal normal.  Nose: No rhinorrhea. Right sinus exhibits maxillary sinus tenderness. Right sinus exhibits no frontal sinus tenderness. Left sinus exhibits maxillary sinus tenderness. Left sinus exhibits no frontal sinus tenderness.  Eyes: Conjunctivae and lids are normal. Right eye exhibits no discharge. Left eye exhibits no discharge. No scleral icterus.  Cardiovascular: Normal rate and regular rhythm.   Pulmonary/Chest: Effort normal and breath sounds normal. No respiratory distress.  Abdominal: Normal appearance. There is no splenomegaly or hepatomegaly.  Musculoskeletal: Normal range of motion.  Neurological: She is alert and oriented to person, place, and time.  Skin: Skin is intact. No rash noted. No pallor.  Psychiatric: She has a normal mood and affect. Her behavior is normal. Judgment and thought content normal.    Results for orders placed or performed in visit on 05/23/15  Bayer DCA Hb A1c Waived  Result Value Ref Range   Bayer DCA Hb A1c Waived 6.4 <7.0 %  Comprehensive metabolic panel  Result Value Ref Range   Glucose 103 (H) 65 - 99 mg/dL   BUN 15 8 - 27 mg/dL   Creatinine, Ser 0.73 0.57 - 1.00 mg/dL   GFR calc non Af Amer 83 >59 mL/min/1.73   GFR calc Af  Amer 95 >59 mL/min/1.73   BUN/Creatinine Ratio 21 11 - 26   Sodium 142 136 - 144 mmol/L   Potassium 3.9 3.5 - 5.2 mmol/L   Chloride 101 97 - 106 mmol/L   CO2 26 18 - 29 mmol/L   Calcium 9.4 8.7 - 10.3 mg/dL   Total Protein 6.3 6.0 - 8.5 g/dL   Albumin 4.1 3.5 - 4.8 g/dL   Globulin, Total 2.2 1.5 - 4.5 g/dL   Albumin/Globulin Ratio 1.9 1.1 - 2.5   Bilirubin Total 0.3 0.0 - 1.2 mg/dL   Alkaline Phosphatase 70 39 - 117 IU/L   AST 15 0 - 40 IU/L   ALT 17 0 - 32 IU/L      Assessment & Plan:   Problem List Items Addressed This Visit       Unprioritized   Diabetes mellitus with chronic kidney disease ([place])   Relevant Orders   Comprehensive metabolic panel   Lipid Panel w/o Chol/HDL Ratio   Bayer DCA Hb A1c Waived   Hypertension    High today.  Start HCTZ      Relevant Medications   hydrochlorothiazide (HYDRODIURIL) 25 MG tablet   Other Relevant Orders   Comprehensive metabolic panel   Lipid Panel w/o Chol/HDL Ratio    Other Visit Diagnoses    Acute sinusitis, recurrence not specified, unspecified location    -  Primary    Relevant Medications    cefdinir (OMNICEF) 300 MG capsule    Weight loss        Relevant Orders    Comprehensive metabolic panel    CBC with Differential/Platelet    TSH       Will get lab to check thyroid as well as CBC and chemistries.  Recheck 2-3 weeks as pt feeling badly today and we will see what further symptoms she has after being treated for a sinusitis  Follow up plan: Return in about 2 weeks (around 10/03/2015).

## 2015-09-20 LAB — COMPREHENSIVE METABOLIC PANEL
A/G RATIO: 1.7 (ref 1.2–2.2)
ALT: 13 IU/L (ref 0–32)
AST: 13 IU/L (ref 0–40)
Albumin: 4 g/dL (ref 3.5–4.8)
Alkaline Phosphatase: 56 IU/L (ref 39–117)
BILIRUBIN TOTAL: 0.3 mg/dL (ref 0.0–1.2)
BUN / CREAT RATIO: 21 (ref 11–26)
BUN: 15 mg/dL (ref 8–27)
CALCIUM: 9.1 mg/dL (ref 8.7–10.3)
CO2: 27 mmol/L (ref 18–29)
Chloride: 100 mmol/L (ref 96–106)
Creatinine, Ser: 0.72 mg/dL (ref 0.57–1.00)
GFR, EST AFRICAN AMERICAN: 96 mL/min/{1.73_m2} (ref >59)
GFR, EST NON AFRICAN AMERICAN: 83 mL/min/{1.73_m2} (ref >59)
GLOBULIN, TOTAL: 2.4 g/dL (ref 1.5–4.5)
Glucose: 91 mg/dL (ref 65–99)
POTASSIUM: 4.4 mmol/L (ref 3.5–5.2)
SODIUM: 143 mmol/L (ref 134–144)
TOTAL PROTEIN: 6.4 g/dL (ref 6.0–8.5)

## 2015-09-20 LAB — CBC WITH DIFFERENTIAL/PLATELET
BASOS: 0 %
Basophils Absolute: 0 10*3/uL (ref 0.0–0.2)
EOS (ABSOLUTE): 0.1 10*3/uL (ref 0.0–0.4)
Eos: 2 %
Hematocrit: 35.1 % (ref 34.0–46.6)
Hemoglobin: 10.9 g/dL — ABNORMAL LOW (ref 11.1–15.9)
IMMATURE GRANS (ABS): 0.1 10*3/uL (ref 0.0–0.1)
Immature Granulocytes: 1 %
LYMPHS ABS: 2.2 10*3/uL (ref 0.7–3.1)
Lymphs: 39 %
MCH: 22.4 pg — AB (ref 26.6–33.0)
MCHC: 31.1 g/dL — AB (ref 31.5–35.7)
MCV: 72 fL — ABNORMAL LOW (ref 79–97)
MONOS ABS: 0.6 10*3/uL (ref 0.1–0.9)
Monocytes: 11 %
NEUTROS ABS: 2.8 10*3/uL (ref 1.4–7.0)
Neutrophils: 47 %
PLATELETS: 267 10*3/uL (ref 150–379)
RBC: 4.87 x10E6/uL (ref 3.77–5.28)
RDW: 16.5 % — AB (ref 12.3–15.4)
WBC: 5.8 10*3/uL (ref 3.4–10.8)

## 2015-09-20 LAB — TSH: TSH: 0.756 u[IU]/mL (ref 0.450–4.500)

## 2015-09-20 LAB — LIPID PANEL W/O CHOL/HDL RATIO
Cholesterol, Total: 185 mg/dL (ref 100–199)
HDL: 42 mg/dL (ref >39)
LDL Calculated: 120 mg/dL — ABNORMAL HIGH (ref 0–99)
Triglycerides: 117 mg/dL (ref 0–149)
VLDL Cholesterol Cal: 23 mg/dL (ref 5–40)

## 2015-09-21 LAB — ANEMIA PROFILE A
BASOS ABS: 0 10*3/uL (ref 0.0–0.2)
BASOS: 0 %
EOS (ABSOLUTE): 0.1 10*3/uL (ref 0.0–0.4)
EOS: 2 %
HEMOGLOBIN: 11 g/dL — AB (ref 11.1–15.9)
Hematocrit: 37 % (ref 34.0–46.6)
IRON SATURATION: 19 % (ref 15–55)
IRON: 48 ug/dL (ref 27–139)
Immature Grans (Abs): 0 10*3/uL (ref 0.0–0.1)
Immature Granulocytes: 1 %
LYMPHS ABS: 2.3 10*3/uL (ref 0.7–3.1)
Lymphs: 42 %
MCH: 22.5 pg — ABNORMAL LOW (ref 26.6–33.0)
MCHC: 29.7 g/dL — ABNORMAL LOW (ref 31.5–35.7)
MCV: 76 fL — ABNORMAL LOW (ref 79–97)
Monocytes Absolute: 0.5 10*3/uL (ref 0.1–0.9)
Monocytes: 9 %
Neutrophils Absolute: 2.6 10*3/uL (ref 1.4–7.0)
Neutrophils: 46 %
PLATELETS: 273 10*3/uL (ref 150–379)
RBC: 4.89 x10E6/uL (ref 3.77–5.28)
RDW: 16.4 % — ABNORMAL HIGH (ref 12.3–15.4)
Retic Ct Pct: 1.4 % (ref 0.6–2.6)
TIBC: 250 ug/dL (ref 250–450)
UIBC: 202 ug/dL (ref 118–369)
WBC: 5.5 10*3/uL (ref 3.4–10.8)

## 2015-09-21 LAB — SPECIMEN STATUS REPORT

## 2015-10-03 ENCOUNTER — Ambulatory Visit (INDEPENDENT_AMBULATORY_CARE_PROVIDER_SITE_OTHER): Payer: Medicare Other | Admitting: Unknown Physician Specialty

## 2015-10-03 ENCOUNTER — Encounter: Payer: Self-pay | Admitting: Unknown Physician Specialty

## 2015-10-03 VITALS — BP 155/80 | HR 87 | Temp 98.4°F | Ht 60.6 in | Wt 172.8 lb

## 2015-10-03 DIAGNOSIS — I10 Essential (primary) hypertension: Secondary | ICD-10-CM

## 2015-10-03 DIAGNOSIS — R05 Cough: Secondary | ICD-10-CM

## 2015-10-03 DIAGNOSIS — R059 Cough, unspecified: Secondary | ICD-10-CM

## 2015-10-03 DIAGNOSIS — R5382 Chronic fatigue, unspecified: Secondary | ICD-10-CM

## 2015-10-03 MED ORDER — FLUTICASONE PROPIONATE 50 MCG/ACT NA SUSP: 2.0000 | Freq: Every day | NASAL | Status: DC

## 2015-10-03 NOTE — Patient Instructions (Signed)
Take 2 puffs twice a day of inhaler Start Flonase

## 2015-10-03 NOTE — Assessment & Plan Note (Signed)
BP almost to goal. Will recheck at 2 week follow up.

## 2015-10-03 NOTE — Progress Notes (Signed)
BP 155/80 mmHg  Pulse 87  Temp(Src) 98.4 F (36.9 C)  Ht 5' 0.6" (1.539 m)  Wt 172 lb 12.8 oz (78.382 kg)  BMI 33.09 kg/m2  SpO2 97%  LMP  (LMP Unknown)   Subjective:    Patient ID: Tina Mcdonald, female    DOB: Feb 25, 1941, 75 y.o.   MRN: [id number]  HPI: Tina Mcdonald is a 75 y.o. female  Chief Complaint  Patient presents with  . Hypertension    2 week f/u  . Fatigue    2 week f/u   Cough This is a recurrent problem. The current episode started more than 1 month ago. The problem has been unchanged. The problem occurs every few minutes. The cough is productive of purulent sputum. Associated symptoms include chest pain, headaches, nasal congestion, shortness of breath and weight loss. Pertinent negatives include no fever, rhinorrhea or sore throat. The symptoms are aggravated by other (Sometimes after eating or drinking ). Treatments tried: antibiotic given at last visit for sinusitis. The treatment provided no relief. Her past medical history is significant for COPD. COPD diagnosed "years ago"    Hypertension Using medications without difficulty Average home BPs: doesn't take   No problems or lightheadedness No chest pain with exertion or shortness of breath: "sometimes" No Edema  Fatigue: Patient states that she stays tired. Doesn't have set bed time and often falls asleep in chair. Doesn't take naps, but if patient is still for long enough, she will fall asleep. Patient unsure as to how many hours of sleep she gets because she gets up 3-4 times to urinate.    Relevant past medical, surgical, family and social history reviewed and updated as indicated. Interim medical history since our last visit reviewed. Allergies and medications reviewed and updated.  Review of Systems  Constitutional: Positive for weight loss. Negative for fever.  HENT: Negative for rhinorrhea and sore throat.   Respiratory: Positive for cough and shortness of breath.   Cardiovascular:  Positive for chest pain.  Neurological: Positive for headaches.    Per HPI unless specifically indicated above     Objective:    BP 155/80 mmHg  Pulse 87  Temp(Src) 98.4 F (36.9 C)  Ht 5' 0.6" (1.539 m)  Wt 172 lb 12.8 oz (78.382 kg)  BMI 33.09 kg/m2  SpO2 97%  LMP  (LMP Unknown)  Wt Readings from Last 3 Encounters:  10/03/15 172 lb 12.8 oz (78.382 kg)  09/19/15 173 lb 12.8 oz (78.835 kg)  07/15/15 180 lb (81.647 kg)    Physical Exam  Constitutional: She is oriented to person, place, and time. She appears well-developed and well-nourished. No distress.  HENT:  Head: Normocephalic and atraumatic.  Eyes: Conjunctivae and lids are normal. Right eye exhibits no discharge. Left eye exhibits no discharge. No scleral icterus.  Neck: Normal range of motion. Neck supple. No JVD present. Carotid bruit is not present.  Cardiovascular: Normal rate, regular rhythm and normal heart sounds.   Pulmonary/Chest: Effort normal and breath sounds normal.  Abdominal: Normal appearance. There is no splenomegaly or hepatomegaly.  Musculoskeletal: Normal range of motion.  Neurological: She is alert and oriented to person, place, and time.  Skin: Skin is warm, dry and intact. No rash noted. No pallor.  Psychiatric: She has a normal mood and affect. Her behavior is normal. Judgment and thought content normal.        Assessment & Plan:   Problem List Items Addressed This Visit  Unprioritized   Hypertension    BP almost to goal. Will recheck at 2 week follow up.        Other Visit Diagnoses    Cough    -  Primary    Start Asmanex and Flonase with recheck in two weeks. if not improved, consider referral to Pulm.     Relevant Orders    Spirometry with Graph (Completed)    Chronic fatigue        Possibly due to patient feeling bad with cough. Recheck in 2 weeks to see if symptoms have improved with inhaler use.         Follow up plan: Return in about 2 weeks (around  10/17/2015).

## 2015-10-05 ENCOUNTER — Other Ambulatory Visit: Payer: Self-pay | Admitting: Unknown Physician Specialty

## 2015-10-17 ENCOUNTER — Ambulatory Visit (INDEPENDENT_AMBULATORY_CARE_PROVIDER_SITE_OTHER): Payer: Medicare Other | Admitting: Unknown Physician Specialty

## 2015-10-17 ENCOUNTER — Encounter: Payer: Self-pay | Admitting: Unknown Physician Specialty

## 2015-10-17 VITALS — BP 144/84 | HR 92 | Temp 98.7°F | Ht 60.5 in | Wt 172.8 lb

## 2015-10-17 DIAGNOSIS — R05 Cough: Secondary | ICD-10-CM

## 2015-10-17 DIAGNOSIS — I10 Essential (primary) hypertension: Secondary | ICD-10-CM

## 2015-10-17 DIAGNOSIS — R052 Subacute cough: Secondary | ICD-10-CM | POA: Insufficient documentation

## 2015-10-17 DIAGNOSIS — R059 Cough, unspecified: Secondary | ICD-10-CM

## 2015-10-17 DIAGNOSIS — E538 Deficiency of other specified B group vitamins: Secondary | ICD-10-CM

## 2015-10-17 NOTE — Progress Notes (Signed)
BP 144/84 mmHg  Pulse 92  Temp(Src) 98.7 F (37.1 C)  Ht 5' 0.5" (1.537 m)  Wt 172 lb 12.8 oz (78.382 kg)  BMI 33.18 kg/m2  SpO2 97%  LMP  (LMP Unknown)   Subjective:    Patient ID: Tina Mcdonald, female    DOB: 1941/05/25, 75 y.o.   MRN: [id number]  HPI: Tina Mcdonald is a 75 y.o. female  Chief Complaint  Patient presents with  . Hypertension    2 week f/u  . Cough    2 week f/u   Cough She is taking Asmanex but not Flonase.  States she think it was giving her a headache.  Still coughing some thick mucous but notably better.  She has not smoked since 1999  Hypertension Using medications without difficulty Average home BPs: not checking   No problems or lightheadedness No chest pain with exertion or shortness of breath No Edema   Relevant past medical, surgical, family and social history reviewed and updated as indicated. Interim medical history since our last visit reviewed. Allergies and medications reviewed and updated.  Review of Systems  Per HPI unless specifically indicated above     Objective:    BP 144/84 mmHg  Pulse 92  Temp(Src) 98.7 F (37.1 C)  Ht 5' 0.5" (1.537 m)  Wt 172 lb 12.8 oz (78.382 kg)  BMI 33.18 kg/m2  SpO2 97%  LMP  (LMP Unknown)  Wt Readings from Last 3 Encounters:  10/17/15 172 lb 12.8 oz (78.382 kg)  10/03/15 172 lb 12.8 oz (78.382 kg)  09/19/15 173 lb 12.8 oz (78.835 kg)    Physical Exam  Constitutional: She is oriented to person, place, and time. She appears well-developed and well-nourished. No distress.  HENT:  Head: Normocephalic and atraumatic.  Eyes: Conjunctivae and lids are normal. Right eye exhibits no discharge. Left eye exhibits no discharge. No scleral icterus.  Neck: Normal range of motion. Neck supple. No JVD present. Carotid bruit is not present.  Cardiovascular: Normal rate, regular rhythm and normal heart sounds.   Pulmonary/Chest: Effort normal and breath sounds normal.  Abdominal: Normal  appearance. There is no splenomegaly or hepatomegaly.  Musculoskeletal: Normal range of motion.  Neurological: She is alert and oriented to person, place, and time.  Skin: Skin is warm, dry and intact. No rash noted. No pallor.  Psychiatric: She has a normal mood and affect. Her behavior is normal. Judgment and thought content normal.    Results for orders placed or performed in visit on 09/19/15  Comprehensive metabolic panel  Result Value Ref Range   Glucose 91 65 - 99 mg/dL   BUN 15 8 - 27 mg/dL   Creatinine, Ser 0.72 0.57 - 1.00 mg/dL   GFR calc non Af Amer 83 >59 mL/min/1.73   GFR calc Af Amer 96 >59 mL/min/1.73   BUN/Creatinine Ratio 21 11 - 26   Sodium 143 134 - 144 mmol/L   Potassium 4.4 3.5 - 5.2 mmol/L   Chloride 100 96 - 106 mmol/L   CO2 27 18 - 29 mmol/L   Calcium 9.1 8.7 - 10.3 mg/dL   Total Protein 6.4 6.0 - 8.5 g/dL   Albumin 4.0 3.5 - 4.8 g/dL   Globulin, Total 2.4 1.5 - 4.5 g/dL   Albumin/Globulin Ratio 1.7 1.2 - 2.2   Bilirubin Total 0.3 0.0 - 1.2 mg/dL   Alkaline Phosphatase 56 39 - 117 IU/L   AST 13 0 - 40 IU/L   ALT 13  0 - 32 IU/L  CBC with Differential/Platelet  Result Value Ref Range   WBC 5.8 3.4 - 10.8 x10E3/uL   RBC 4.87 3.77 - 5.28 x10E6/uL   Hemoglobin 10.9 (L) 11.1 - 15.9 g/dL   Hematocrit 35.1 34.0 - 46.6 %   MCV 72 (L) 79 - 97 fL   MCH 22.4 (L) 26.6 - 33.0 pg   MCHC 31.1 (L) 31.5 - 35.7 g/dL   RDW 16.5 (H) 12.3 - 15.4 %   Platelets 267 150 - 379 x10E3/uL   Neutrophils 47 %   Lymphs 39 %   Monocytes 11 %   Eos 2 %   Basos 0 %   Neutrophils Absolute 2.8 1.4 - 7.0 x10E3/uL   Lymphocytes Absolute 2.2 0.7 - 3.1 x10E3/uL   Monocytes Absolute 0.6 0.1 - 0.9 x10E3/uL   EOS (ABSOLUTE) 0.1 0.0 - 0.4 x10E3/uL   Basophils Absolute 0.0 0.0 - 0.2 x10E3/uL   Immature Granulocytes 1 %   Immature Grans (Abs) 0.1 0.0 - 0.1 x10E3/uL  TSH  Result Value Ref Range   TSH 0.756 0.450 - 4.500 uIU/mL  Lipid Panel w/o Chol/HDL Ratio  Result Value Ref Range    Cholesterol, Total 185 100 - 199 mg/dL   Triglycerides 117 0 - 149 mg/dL   HDL 42 >39 mg/dL   VLDL Cholesterol Cal 23 5 - 40 mg/dL   LDL Calculated 120 (H) 0 - 99 mg/dL  Bayer DCA Hb A1c Waived  Result Value Ref Range   Bayer DCA Hb A1c Waived 6.8 <7.0 %  Anemia Profile A  Result Value Ref Range   Total Iron Binding Capacity 250 250 - 450 ug/dL   UIBC 202 118 - 369 ug/dL   Iron 48 27 - 139 ug/dL   Iron Saturation 19 15 - 55 %   WBC 5.5 3.4 - 10.8 x10E3/uL   RBC 4.89 3.77 - 5.28 x10E6/uL   Hemoglobin 11.0 (L) 11.1 - 15.9 g/dL   Hematocrit 37.0 34.0 - 46.6 %   MCV 76 (L) 79 - 97 fL   MCH 22.5 (L) 26.6 - 33.0 pg   MCHC 29.7 (L) 31.5 - 35.7 g/dL   RDW 16.4 (H) 12.3 - 15.4 %   Platelets 273 150 - 379 x10E3/uL   Neutrophils 46 %   Lymphs 42 %   Monocytes 9 %   Eos 2 %   Basos 0 %   Neutrophils Absolute 2.6 1.4 - 7.0 x10E3/uL   Lymphocytes Absolute 2.3 0.7 - 3.1 x10E3/uL   Monocytes Absolute 0.5 0.1 - 0.9 x10E3/uL   EOS (ABSOLUTE) 0.1 0.0 - 0.4 x10E3/uL   Basophils Absolute 0.0 0.0 - 0.2 x10E3/uL   Immature Granulocytes 1 %   Immature Grans (Abs) 0.0 0.0 - 0.1 x10E3/uL   Retic Ct Pct 1.4 0.6 - 2.6 %  Specimen status report  Result Value Ref Range   specimen status report Comment       Assessment & Plan:   Problem List Items Addressed This Visit      Unprioritized   Cough    Probably secondary to COPD.  Continue Asmanex.  Restart Flonase      Hypertension - Primary    Improving.  Check next month at PE          Follow up plan: Return for next month for PE.

## 2015-10-17 NOTE — Assessment & Plan Note (Signed)
Probably secondary to COPD.  Continue Asmanex.  Restart Flonase

## 2015-10-17 NOTE — Assessment & Plan Note (Signed)
Improving.  Check next month at PE

## 2015-10-18 ENCOUNTER — Ambulatory Visit: Payer: Medicare Other

## 2015-11-08 ENCOUNTER — Other Ambulatory Visit: Payer: Self-pay | Admitting: Unknown Physician Specialty

## 2015-11-08 NOTE — Telephone Encounter (Signed)
Patient has f/u scheduled 11/20/15. Pharmacy is [facility] [place].

## 2015-11-20 ENCOUNTER — Ambulatory Visit (INDEPENDENT_AMBULATORY_CARE_PROVIDER_SITE_OTHER): Payer: Medicare Other | Admitting: Unknown Physician Specialty

## 2015-11-20 ENCOUNTER — Encounter: Payer: Self-pay | Admitting: Unknown Physician Specialty

## 2015-11-20 VITALS — BP 126/83 | HR 84 | Temp 98.2°F | Ht 60.5 in | Wt 175.2 lb

## 2015-11-20 DIAGNOSIS — I10 Essential (primary) hypertension: Secondary | ICD-10-CM | POA: Diagnosis not present

## 2015-11-20 DIAGNOSIS — Z0000 Encounter for general adult medical examination without abnormal findings: Secondary | ICD-10-CM

## 2015-11-20 DIAGNOSIS — E538 Deficiency of other specified B group vitamins: Secondary | ICD-10-CM | POA: Diagnosis not present

## 2015-11-20 DIAGNOSIS — Z1239 Encounter for other screening for malignant neoplasm of breast: Secondary | ICD-10-CM | POA: Diagnosis not present

## 2015-11-20 DIAGNOSIS — E785 Hyperlipidemia, unspecified: Secondary | ICD-10-CM

## 2015-11-20 DIAGNOSIS — E2839 Other primary ovarian failure: Secondary | ICD-10-CM

## 2015-11-20 DIAGNOSIS — E1122 Type 2 diabetes mellitus with diabetic chronic kidney disease: Secondary | ICD-10-CM

## 2015-11-20 LAB — LIPID PANEL PICCOLO, WAIVED
CHOLESTEROL PICCOLO, WAIVED: 194 mg/dL (ref <200)
Chol/HDL Ratio Piccolo,Waive: 3.8 mg/dL
HDL CHOL PICCOLO, WAIVED: 51 mg/dL — AB (ref >59)
LDL CHOL CALC PICCOLO WAIVED: 125 mg/dL — AB (ref <100)
Triglycerides Piccolo,Waived: 90 mg/dL (ref <150)
VLDL CHOL CALC PICCOLO,WAIVE: 18 mg/dL (ref <30)

## 2015-11-20 LAB — BAYER DCA HB A1C WAIVED: HB A1C (BAYER DCA - WAIVED): 7.2 % — ABNORMAL HIGH (ref <7.0)

## 2015-11-20 NOTE — Assessment & Plan Note (Signed)
Await lipid panel 

## 2015-11-20 NOTE — Progress Notes (Signed)
BP 126/83 mmHg  Pulse 84  Temp(Src) 98.2 F (36.8 C)  Ht 5' 0.5" (1.537 m)  Wt 175 lb 3.2 oz (79.47 kg)  BMI 33.64 kg/m2  SpO2 97%  LMP  (LMP Unknown)   Subjective:    Patient ID: Tina Mcdonald, female    DOB: 1940/12/27, 75 y.o.   MRN: [id number]  HPI: Tina Mcdonald is a 75 y.o. female  Chief Complaint  Patient presents with  . Medicare Wellness  . Diabetes    pt states last eye exam was recent, will fax form to [facility] eye care   Diabetes: Using medications without difficulties.  States she stays thirsty.   No hypoglycemic episodes No hyperglycemic episodes Feet problems: Blood Sugars averaging: 112 this AM eye exam within last year Last Hgb A1C: 6.8  Hypertension  Using medications without difficulty Average home BPs   Using medication without problems or lightheadedness No chest pain with exertion or shortness of breath No Edema  Elevated Cholesterol Using medications without problems No Muscle aches  Diet: Bakes most foods Exercise: Not exercising  Functional Status Survey: Is the patient deaf or have difficulty hearing?: No Does the patient have difficulty seeing, even when wearing glasses/contacts?: No (pt states she has to wear reading glasses) Does the patient have difficulty concentrating, remembering, or making decisions?: No Does the patient have difficulty walking or climbing stairs?: Yes Does the patient have difficulty dressing or bathing?: No Does the patient have difficulty doing errands alone such as visiting a doctor's office or shopping?: No  . Fall Risk  11/20/2015 10/03/2015  Falls in the past year? No No   Depression screen [facility] 2/9 11/20/2015 10/03/2015 01/16/2015  Decreased Interest 0 0 0  Down, Depressed, Hopeless 0 0 0  PHQ - 2 Score 0 0 0    Relevant past medical, surgical, family and social history reviewed and updated as indicated. Interim medical history since our last visit reviewed. Allergies and medications reviewed and  updated.  See functional status, depression screen, and fall's risk assessment  under the appropriate section.    Pt is able to perform complex mental tasks, recognize clock face, recognize time and do a 3 item recall.     Relevant past medical, surgical, family and social history reviewed and updated as indicated. Interim medical history since our last visit reviewed. Allergies and medications reviewed and updated.  Review of Systems  Constitutional: Negative.   HENT: Negative.   Eyes: Negative.   Respiratory: Negative.   Cardiovascular: Negative.   Gastrointestinal: Negative.   Endocrine: Negative.   Genitourinary: Negative.   Musculoskeletal: Negative.   Skin: Negative.   Allergic/Immunologic: Negative.   Neurological: Negative.   Hematological: Negative.   Psychiatric/Behavioral: Negative.     Per HPI unless specifically indicated above     Objective:    BP 126/83 mmHg  Pulse 84  Temp(Src) 98.2 F (36.8 C)  Ht 5' 0.5" (1.537 m)  Wt 175 lb 3.2 oz (79.47 kg)  BMI 33.64 kg/m2  SpO2 97%  LMP  (LMP Unknown)  Wt Readings from Last 3 Encounters:  11/20/15 175 lb 3.2 oz (79.47 kg)  10/17/15 172 lb 12.8 oz (78.382 kg)  10/03/15 172 lb 12.8 oz (78.382 kg)    Physical Exam  Constitutional: She is oriented to person, place, and time. She appears well-developed and well-nourished.  HENT:  Head: Normocephalic and atraumatic.  Eyes: Pupils are equal, round, and reactive to light. Right eye exhibits no discharge. Left eye  exhibits no discharge. No scleral icterus.  Neck: Normal range of motion. Neck supple. Carotid bruit is not present. No thyromegaly present.  Cardiovascular: Normal rate, regular rhythm and normal heart sounds.  Exam reveals no gallop and no friction rub.   No murmur heard. Pulmonary/Chest: Effort normal and breath sounds normal. No respiratory distress. She has no wheezes. She has no rales.  Abdominal: Soft. Bowel sounds are normal. There is no  tenderness. There is no rebound.  Genitourinary: No breast swelling, tenderness or discharge.  Musculoskeletal: Normal range of motion.  Lymphadenopathy:    She has no cervical adenopathy.  Neurological: She is alert and oriented to person, place, and time.  Skin: Skin is warm, dry and intact. No rash noted.  Psychiatric: She has a normal mood and affect. Her speech is normal and behavior is normal. Judgment and thought content normal. Cognition and memory are normal.    Results for orders placed or performed in visit on 09/19/15  Comprehensive metabolic panel  Result Value Ref Range   Glucose 91 65 - 99 mg/dL   BUN 15 8 - 27 mg/dL   Creatinine, Ser 0.72 0.57 - 1.00 mg/dL   GFR calc non Af Amer 83 >59 mL/min/1.73   GFR calc Af Amer 96 >59 mL/min/1.73   BUN/Creatinine Ratio 21 11 - 26   Sodium 143 134 - 144 mmol/L   Potassium 4.4 3.5 - 5.2 mmol/L   Chloride 100 96 - 106 mmol/L   CO2 27 18 - 29 mmol/L   Calcium 9.1 8.7 - 10.3 mg/dL   Total Protein 6.4 6.0 - 8.5 g/dL   Albumin 4.0 3.5 - 4.8 g/dL   Globulin, Total 2.4 1.5 - 4.5 g/dL   Albumin/Globulin Ratio 1.7 1.2 - 2.2   Bilirubin Total 0.3 0.0 - 1.2 mg/dL   Alkaline Phosphatase 56 39 - 117 IU/L   AST 13 0 - 40 IU/L   ALT 13 0 - 32 IU/L  CBC with Differential/Platelet  Result Value Ref Range   WBC 5.8 3.4 - 10.8 x10E3/uL   RBC 4.87 3.77 - 5.28 x10E6/uL   Hemoglobin 10.9 (L) 11.1 - 15.9 g/dL   Hematocrit 35.1 34.0 - 46.6 %   MCV 72 (L) 79 - 97 fL   MCH 22.4 (L) 26.6 - 33.0 pg   MCHC 31.1 (L) 31.5 - 35.7 g/dL   RDW 16.5 (H) 12.3 - 15.4 %   Platelets 267 150 - 379 x10E3/uL   Neutrophils 47 %   Lymphs 39 %   Monocytes 11 %   Eos 2 %   Basos 0 %   Neutrophils Absolute 2.8 1.4 - 7.0 x10E3/uL   Lymphocytes Absolute 2.2 0.7 - 3.1 x10E3/uL   Monocytes Absolute 0.6 0.1 - 0.9 x10E3/uL   EOS (ABSOLUTE) 0.1 0.0 - 0.4 x10E3/uL   Basophils Absolute 0.0 0.0 - 0.2 x10E3/uL   Immature Granulocytes 1 %   Immature Grans (Abs) 0.1 0.0  - 0.1 x10E3/uL  TSH  Result Value Ref Range   TSH 0.756 0.450 - 4.500 uIU/mL  Lipid Panel w/o Chol/HDL Ratio  Result Value Ref Range   Cholesterol, Total 185 100 - 199 mg/dL   Triglycerides 117 0 - 149 mg/dL   HDL 42 >39 mg/dL   VLDL Cholesterol Cal 23 5 - 40 mg/dL   LDL Calculated 120 (H) 0 - 99 mg/dL  Bayer DCA Hb A1c Waived  Result Value Ref Range   Bayer DCA Hb A1c Waived 6.8 <7.0 %  Anemia Profile A  Result Value Ref Range   Total Iron Binding Capacity 250 250 - 450 ug/dL   UIBC 202 118 - 369 ug/dL   Iron 48 27 - 139 ug/dL   Iron Saturation 19 15 - 55 %   WBC 5.5 3.4 - 10.8 x10E3/uL   RBC 4.89 3.77 - 5.28 x10E6/uL   Hemoglobin 11.0 (L) 11.1 - 15.9 g/dL   Hematocrit 37.0 34.0 - 46.6 %   MCV 76 (L) 79 - 97 fL   MCH 22.5 (L) 26.6 - 33.0 pg   MCHC 29.7 (L) 31.5 - 35.7 g/dL   RDW 16.4 (H) 12.3 - 15.4 %   Platelets 273 150 - 379 x10E3/uL   Neutrophils 46 %   Lymphs 42 %   Monocytes 9 %   Eos 2 %   Basos 0 %   Neutrophils Absolute 2.6 1.4 - 7.0 x10E3/uL   Lymphocytes Absolute 2.3 0.7 - 3.1 x10E3/uL   Monocytes Absolute 0.5 0.1 - 0.9 x10E3/uL   EOS (ABSOLUTE) 0.1 0.0 - 0.4 x10E3/uL   Basophils Absolute 0.0 0.0 - 0.2 x10E3/uL   Immature Granulocytes 1 %   Immature Grans (Abs) 0.0 0.0 - 0.1 x10E3/uL   Retic Ct Pct 1.4 0.6 - 2.6 %  Specimen status report  Result Value Ref Range   specimen status report Comment       Assessment & Plan:   Problem List Items Addressed This Visit      Unprioritized   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 She cannot identify any changes she is willing to make for diet and exercise.  I have encouraged her to "get out" a nd little more which she is willing to do.  Recheck in 3 months      Relevant Orders   Bayer DCA Hb A1c Waived   Hyperlipidemia    Await lipid panel      Relevant Orders   Lipid Panel [place], Waived   Hypertension    Stable, continue present medications.        Vitamin B12  deficiency - Primary    Other Visit Diagnoses    Annual physical exam        Relevant Orders    Ambulatory referral to Gastroenterology    Ovarian failure        Relevant Orders    DG Bone Density    Screening for breast cancer        Relevant Orders    MM DIGITAL SCREENING BILATERAL        Follow up plan: Return in about 3 months (around 02/20/2016).

## 2015-11-20 NOTE — Assessment & Plan Note (Signed)
Stable, continue present medications.   

## 2015-11-20 NOTE — Assessment & Plan Note (Signed)
Hgb A1C A1C is 7.2. She cannot identify any changes she is willing to make for diet and exercise.  I have encouraged her to "get out" a nd little more which she is willing to do.  Recheck in 3 months

## 2015-11-30 ENCOUNTER — Other Ambulatory Visit: Payer: Self-pay

## 2015-11-30 ENCOUNTER — Telehealth: Payer: Self-pay

## 2015-11-30 NOTE — Telephone Encounter (Signed)
Gastroenterology Pre-Procedure Review  Request Date: 12/10/15 Requesting Physician: Kathrine Haddock, NP  PATIENT REVIEW QUESTIONS: The patient responded to the following health history questions as indicated:    1. Are you having any GI issues? no 2. Do you have a personal history of Polyps? no 3. Do you have a family history of Colon Cancer or Polyps? no 4. Diabetes Mellitus? yes (Type 2) 5. Joint replacements in the past 12 months?no 6. Major health problems in the past 3 months?no 7. Any artificial heart valves, MVP, or defibrillator?no    MEDICATIONS & ALLERGIES:    Patient reports the following regarding taking any anticoagulation/antiplatelet therapy:   Plavix, Coumadin, Eliquis, Xarelto, Lovenox, Pradaxa, Brilinta, or Effient? no Aspirin? no  Patient confirms/reports the following medications:  Current Outpatient Prescriptions  Medication Sig Dispense Refill  . atorvastatin (LIPITOR) 20 MG tablet TAKE ONE TABLET BY MOUTH ONCE DAILY 90 tablet 0  . cloNIDine (CATAPRES) 0.1 MG tablet Take 0.1 mg by mouth 2 (two) times daily.    . cyanocobalamin (,VITAMIN B-12,) 1000 MCG/ML injection Inject 1 mL (1,000 mcg total) into the muscle once. 1 mL 0  . glucose blood test strip Check daily. ICD 10 E11.9 100 each 12  . hydrochlorothiazide (HYDRODIURIL) 25 MG tablet Take 1 tablet (25 mg total) by mouth daily. 30 tablet 3  . metFORMIN (GLUCOPHAGE-XR) 500 MG 24 hr tablet TAKE TWO TABLETS BY MOUTH TWICE DAILY 360 tablet 0  . metoprolol succinate (TOPROL-XL) 50 MG 24 hr tablet TAKE ONE TABLET BY MOUTH ONCE DAILY 90 tablet 0  . omeprazole (PRILOSEC) 20 MG capsule Take 1 capsule (20 mg total) by mouth daily. 90 capsule 3   No current facility-administered medications for this visit.    Patient confirms/reports the following allergies:  Allergies  Allergen Reactions  . Contrast Media [Iodinated Diagnostic Agents] Itching  . Neosporin [Neomycin-Bacitracin Zn-Polymyx] Itching  . Penicillins  Itching  . Sulfa Antibiotics Itching  . Latex Rash    No orders of the defined types were placed in this encounter.    AUTHORIZATION INFORMATION Primary Insurance: 1D#: Group #:  Secondary Insurance: 1D#: Group #:  SCHEDULE INFORMATION: Date: 12/10/15 Time: Location: [place]

## 2015-12-03 ENCOUNTER — Encounter: Payer: Self-pay | Admitting: *Deleted

## 2015-12-03 NOTE — Anesthesia Preprocedure Evaluation (Addendum)
Anesthesia Evaluation  Patient identified by MRN, date of birth, ID band  Reviewed: NPO status   History of Anesthesia Complications Negative for: history of anesthetic complications  Airway Mallampati: II  TM Distance: >3 FB Neck ROM: full    Dental  (+) Chipped,    Pulmonary neg pulmonary ROS, sleep apnea (no cpap) , former smoker,    Pulmonary exam normal        Cardiovascular Exercise Tolerance: Good hypertension, Normal cardiovascular exam     Neuro/Psych neuropathy negative psych ROS   GI/Hepatic Neg liver ROS, GERD  Medicated,  Endo/Other  negative endocrine ROSdiabetesMorbid obesity (bmi=32)  Renal/GU CRFRenal disease  negative genitourinary   Musculoskeletal  (+) Arthritis ,   Abdominal   Peds  Hematology  (+) anemia ,   Anesthesia Other Findings stress: 02/2015: Normal myocardial perfusion scan no evidence of ?stress-induced myocardial ischemia ejection fraction of 73% conclusion ?negative scan;  echo: 02/2015: NORMAL LEFT VENTRICULAR SYSTOLIC FUNCTION WITH MODERATE LVH?MILD VALVULAR REGURGITATION (See above)?NO VALVULAR STENOSIS?MILD PHTN?EF >55%;     Reproductive/Obstetrics negative OB ROS                           Anesthesia Physical Anesthesia Plan  ASA: III  Anesthesia Plan: MAC   Post-op Pain Management:    Induction:   Airway Management Planned:   Additional Equipment:   Intra-op Plan:   Post-operative Plan:   Informed Consent: I have reviewed the patients History and Physical, chart, labs and discussed the procedure including the risks, benefits and alternatives for the proposed anesthesia with the patient or authorized representative who has indicated his/her understanding and acceptance.     Plan Discussed with: CRNA  Anesthesia Plan Comments:         Anesthesia Quick Evaluation                                  Anesthesia Evaluation  Patient  identified by MRN, date of birth, ID band Patient awake    Reviewed: Allergy & Precautions, H&P , NPO status , Patient's Chart, lab work & pertinent test results, reviewed documented beta blocker date and time   History of Anesthesia Complications Negative for: history of anesthetic complications  Airway Mallampati: II TM Distance: >3 FB Neck ROM: Full    Dental  (+) Poor Dentition, Dental Advisory Given, Chipped,    Pulmonary sleep apnea , former smoker,    Pulmonary exam normal       Cardiovascular hypertension, Pt. on medications and Pt. on home beta blockers     Neuro/Psych negative neurological ROS  negative psych ROS   GI/Hepatic Neg liver ROS, GERD-  Medicated,  Endo/Other  diabetes  Renal/GU negative Renal ROS     Musculoskeletal negative musculoskeletal ROS (+)   Abdominal   Peds  Hematology   Anesthesia Other Findings   Reproductive/Obstetrics                          Anesthesia Physical Anesthesia Plan  ASA: III  Anesthesia Plan: General   Post-op Pain Management:    Induction: Intravenous  Airway Management Planned: Oral ETT  Additional Equipment:   Intra-op Plan:   Post-operative Plan: Extubation in OR  Informed Consent: I have reviewed the patients History and Physical, chart, labs and discussed the procedure including the risks, benefits and alternatives for the  proposed anesthesia with the patient or authorized representative who has indicated his/her understanding and acceptance.   Dental advisory given  Plan Discussed with: CRNA, Anesthesiologist and Surgeon  Anesthesia Plan Comments:         Anesthesia Quick Evaluation                                   Anesthesia Evaluation  Patient identified by MRN, date of birth, ID band Patient awake    Reviewed: Allergy & Precautions, H&P , NPO status , Patient's Chart, lab work & pertinent test results, reviewed documented beta blocker date  and time   History of Anesthesia Complications Negative for: history of anesthetic complications  Airway Mallampati: II TM Distance: >3 FB Neck ROM: Full    Dental  (+) Poor Dentition, Dental Advisory Given, Chipped,    Pulmonary sleep apnea , former smoker,    Pulmonary exam normal       Cardiovascular hypertension, Pt. on medications and Pt. on home beta blockers     Neuro/Psych negative neurological ROS  negative psych ROS   GI/Hepatic Neg liver ROS, GERD-  Medicated,  Endo/Other  diabetes  Renal/GU negative Renal ROS     Musculoskeletal negative musculoskeletal ROS (+)   Abdominal   Peds  Hematology   Anesthesia Other Findings   Reproductive/Obstetrics                          Anesthesia Physical Anesthesia Plan  ASA: III  Anesthesia Plan: General   Post-op Pain Management:    Induction: Intravenous  Airway Management Planned: Oral ETT  Additional Equipment:   Intra-op Plan:   Post-operative Plan: Extubation in OR  Informed Consent: I have reviewed the patients History and Physical, chart, labs and discussed the procedure including the risks, benefits and alternatives for the proposed anesthesia with the patient or authorized representative who has indicated his/her understanding and acceptance.   Dental advisory given  Plan Discussed with: CRNA, Anesthesiologist and Surgeon  Anesthesia Plan Comments:         Anesthesia Quick Evaluation

## 2015-12-06 NOTE — Discharge Instructions (Signed)

## 2015-12-10 ENCOUNTER — Ambulatory Visit: Payer: Medicare Other | Admitting: Anesthesiology

## 2015-12-10 ENCOUNTER — Encounter
Admission: RE | Disposition: A | Discharge status: Home / Self Care | Payer: Self-pay | Source: Ambulatory Visit | Attending: Gastroenterology

## 2015-12-10 ENCOUNTER — Ambulatory Visit
Admission: RE | Admit: 2015-12-10 | Discharge: 2015-12-10 | Disposition: A | Discharge status: Home / Self Care | Payer: Medicare Other | Source: Ambulatory Visit | Attending: Gastroenterology | Admitting: Gastroenterology

## 2015-12-10 DIAGNOSIS — N189 Chronic kidney disease, unspecified: Secondary | ICD-10-CM | POA: Diagnosis not present

## 2015-12-10 DIAGNOSIS — Z7984 Long term (current) use of oral hypoglycemic drugs: Secondary | ICD-10-CM | POA: Insufficient documentation

## 2015-12-10 DIAGNOSIS — D125 Benign neoplasm of sigmoid colon: Secondary | ICD-10-CM | POA: Insufficient documentation

## 2015-12-10 DIAGNOSIS — G4730 Sleep apnea, unspecified: Secondary | ICD-10-CM | POA: Diagnosis not present

## 2015-12-10 DIAGNOSIS — M1990 Unspecified osteoarthritis, unspecified site: Secondary | ICD-10-CM | POA: Insufficient documentation

## 2015-12-10 DIAGNOSIS — K219 Gastro-esophageal reflux disease without esophagitis: Secondary | ICD-10-CM | POA: Diagnosis not present

## 2015-12-10 DIAGNOSIS — I129 Hypertensive chronic kidney disease with stage 1 through stage 4 chronic kidney disease, or unspecified chronic kidney disease: Secondary | ICD-10-CM | POA: Insufficient documentation

## 2015-12-10 DIAGNOSIS — K635 Polyp of colon: Secondary | ICD-10-CM | POA: Insufficient documentation

## 2015-12-10 DIAGNOSIS — Z79899 Other long term (current) drug therapy: Secondary | ICD-10-CM | POA: Diagnosis not present

## 2015-12-10 DIAGNOSIS — Z1211 Encounter for screening for malignant neoplasm of colon: Secondary | ICD-10-CM | POA: Insufficient documentation

## 2015-12-10 DIAGNOSIS — E1140 Type 2 diabetes mellitus with diabetic neuropathy, unspecified: Secondary | ICD-10-CM | POA: Insufficient documentation

## 2015-12-10 DIAGNOSIS — E785 Hyperlipidemia, unspecified: Secondary | ICD-10-CM | POA: Insufficient documentation

## 2015-12-10 DIAGNOSIS — E1122 Type 2 diabetes mellitus with diabetic chronic kidney disease: Secondary | ICD-10-CM | POA: Diagnosis not present

## 2015-12-10 HISTORY — PX: COLONOSCOPY WITH PROPOFOL: SHX5780

## 2015-12-10 HISTORY — PX: POLYPECTOMY: SHX5525

## 2015-12-10 LAB — GLUCOSE, CAPILLARY
GLUCOSE-CAPILLARY: 127 mg/dL — AB (ref 65–99)
GLUCOSE-CAPILLARY: 119 mg/dL — AB (ref 65–99)

## 2015-12-10 SURGERY — COLONOSCOPY WITH PROPOFOL
Anesthesia: Monitor Anesthesia Care | Wound class: Contaminated

## 2015-12-10 MED ORDER — LACTATED RINGERS IV SOLN
INTRAVENOUS | Status: DC
  Administered 2015-12-10: 07:00:00 via INTRAVENOUS

## 2015-12-10 MED ORDER — LIDOCAINE HCL (CARDIAC) 20 MG/ML IV SOLN
INTRAVENOUS | Status: DC | PRN
  Administered 2015-12-10: 40 mg via INTRAVENOUS

## 2015-12-10 MED ORDER — STERILE WATER FOR IRRIGATION IR SOLN
Status: DC | PRN
  Administered 2015-12-10: 08:00:00

## 2015-12-10 MED ORDER — PROPOFOL 10 MG/ML IV BOLUS
INTRAVENOUS | Status: DC | PRN
  Administered 2015-12-10 (×5): 40 mg via INTRAVENOUS

## 2015-12-10 SURGICAL SUPPLY — 22 items
CANISTER SUCT 1200ML W/VALVE (MISCELLANEOUS) ×3 IMPLANT
CLIP HMST 235XBRD CATH ROT (MISCELLANEOUS) IMPLANT
CLIP RESOLUTION 360 11X235 (MISCELLANEOUS)
FCP ESCP3.2XJMB 240X2.8X (MISCELLANEOUS)
FORCEPS BIOP RAD 4 LRG CAP 4 (CUTTING FORCEPS) IMPLANT
FORCEPS BIOP RJ4 240 W/NDL (MISCELLANEOUS)
FORCEPS ESCP3.2XJMB 240X2.8X (MISCELLANEOUS) IMPLANT
GOWN CVR UNV OPN BCK APRN NK (MISCELLANEOUS) ×4 IMPLANT
GOWN ISOL THUMB LOOP REG UNIV (MISCELLANEOUS) ×2
INJECTOR VARIJECT VIN23 (MISCELLANEOUS) IMPLANT
KIT DEFENDO VALVE AND CONN (KITS) IMPLANT
KIT ENDO PROCEDURE OLY (KITS) ×3 IMPLANT
MARKER SPOT ENDO TATTOO 5ML (MISCELLANEOUS) IMPLANT
PAD GROUND ADULT SPLIT (MISCELLANEOUS) IMPLANT
PROBE APC STR FIRE (PROBE) IMPLANT
SNARE SHORT THROW 13M SML OVAL (MISCELLANEOUS) ×3 IMPLANT
SNARE SHORT THROW 30M LRG OVAL (MISCELLANEOUS) IMPLANT
SNARE SNG USE RND 15MM (INSTRUMENTS) IMPLANT
SPOT EX ENDOSCOPIC TATTOO (MISCELLANEOUS)
TRAP ETRAP POLY (MISCELLANEOUS) ×3 IMPLANT
VARIJECT INJECTOR VIN23 (MISCELLANEOUS)
WATER STERILE IRR 250ML POUR (IV SOLUTION) ×3 IMPLANT

## 2015-12-10 NOTE — Transfer of Care (Signed)
Immediate Anesthesia Transfer of Care Note  Patient: Tina Mcdonald  Procedure(s) Performed: Procedure(s) with comments: COLONOSCOPY WITH PROPOFOL (N/A) - Diabetic - oral meds Sleep Apnea LATEX allergy POLYPECTOMY  Patient Location: PACU  Anesthesia Type: MAC  Level of Consciousness: awake, alert  and patient cooperative  Airway and Oxygen Therapy: Patient Spontanous Breathing and Patient connected to supplemental oxygen  Post-op Assessment: Post-op Vital signs reviewed, Patient's Cardiovascular Status Stable, Respiratory Function Stable, Patent Airway and No signs of Nausea or vomiting  Post-op Vital Signs: Reviewed and stable  Complications: No apparent anesthesia complications

## 2015-12-10 NOTE — Anesthesia Postprocedure Evaluation (Signed)
Anesthesia Post Note  Patient: Tina Mcdonald  Procedure(s) Performed: Procedure(s) (LRB): COLONOSCOPY WITH PROPOFOL (N/A) POLYPECTOMY  Patient location during evaluation: PACU Anesthesia Type: MAC Level of consciousness: awake and alert Pain management: pain level controlled Vital Signs Assessment: post-procedure vital signs reviewed and stable Respiratory status: spontaneous breathing, nonlabored ventilation, respiratory function stable and patient connected to nasal cannula oxygen Cardiovascular status: stable and blood pressure returned to baseline Anesthetic complications: no    Miyani Cronic

## 2015-12-10 NOTE — H&P (Signed)
Tina Lame, MD [facility] [address], [place], [place] [address] Phone: [phone] Fax : [phone]  Primary Care Physician:  Kathrine Haddock, NP Primary Gastroenterologist:  Dr. Allen Norris  Pre-Procedure History & Physical: HPI:  Tina Mcdonald is a 75 y.o. female is here for a screening colonoscopy.   Past Medical History  Diagnosis Date  . Hypertension   . Diabetes mellitus without complication ([place])   . Arthritis   . GERD (gastroesophageal reflux disease)   . Hyperlipemia   . Wears glasses   . Sleep apnea     does not have her cpap anymore  . Anemia   . Colon polyps   . Vitamin B 12 deficiency   . Radiculopathy   . Stress incontinence   . Urticaria   . Osteoarthritis   . Chronic kidney disease   . Diabetic neuropathy [facility])     Past Surgical History  Procedure Laterality Date  . Lumbar fusion  2009  . Appendectomy    . Cystoscopy    . Breast biopsy      right-neg  . Carpal tunnel release      right  . Colonoscopy    . Abdominal hysterectomy    . Dilation and curettage of uterus    . Shoulder arthroscopy with rotator cuff repair and subacromial decompression Right 11/25/2013    Procedure: SHOULDER ARTHROSCOPY WITH DEBRIDEMENT ROTATOR CUFF REPAIR  A;  Surgeon: Renette Butters, MD;  Location: [place];  Service: Orthopedics;  Laterality: Right;  . Resection distal clavical Right 11/25/2013    Procedure: RIGHT SHOULDER OPEN RESECTION DISTAL CLAVICAL EXCISION SOFT TISSUE TUMOR SHOULDER DEEP SUBFASCIAL INTRAMUSCULAR/DEBRIDEMENT/ROTATOR CUFF REPAIR/BICEPS TENODESIS/MANIPULATION;  Surgeon: Renette Butters, MD;  Location: [place];  Service: Orthopedics;  Laterality: Right;  . Eye surgery Bilateral     lens implant    Prior to Admission medications   Medication Sig Start Date End Date Taking? Authorizing Provider  atorvastatin (LIPITOR) 20 MG tablet TAKE ONE TABLET BY MOUTH ONCE DAILY 10/08/15  Yes Kathrine Haddock, NP  cloNIDine  (CATAPRES) 0.1 MG tablet Take 0.1 mg by mouth 2 (two) times daily.   Yes Historical Provider, MD  cyanocobalamin (,VITAMIN B-12,) 1000 MCG/ML injection Inject 1 mL (1,000 mcg total) into the muscle once. 03/21/15  Yes Kathrine Haddock, NP  glucose blood test strip Check daily. ICD 10 E11.9 01/16/15  Yes Kathrine Haddock, NP  hydrochlorothiazide (HYDRODIURIL) 25 MG tablet Take 1 tablet (25 mg total) by mouth daily. 09/19/15  Yes Kathrine Haddock, NP  metFORMIN (GLUCOPHAGE-XR) 500 MG 24 hr tablet TAKE TWO TABLETS BY MOUTH TWICE DAILY 11/08/15  Yes Kathrine Haddock, NP  metoprolol succinate (TOPROL-XL) 50 MG 24 hr tablet TAKE ONE TABLET BY MOUTH ONCE DAILY 10/08/15  Yes Kathrine Haddock, NP  omeprazole (PRILOSEC) 20 MG capsule Take 1 capsule (20 mg total) by mouth daily. 08/13/15  Yes Kathrine Haddock, NP    Allergies as of 11/30/2015 - Review Complete 11/30/2015  Allergen Reaction Noted  . Contrast media [iodinated diagnostic agents] Itching 11/22/2013  . Neosporin [neomycin-bacitracin zn-polymyx] Itching 11/22/2013  . Penicillins Itching 11/22/2013  . Sulfa antibiotics Itching 11/22/2013  . Latex Rash 11/25/2013    Family History  Problem Relation Age of Onset  . Heart disease Mother   . Diabetes Mother   . Hypertension Mother   . Heart disease Father   . Hypertension Father   . Diabetes Sister   . Hypertension Sister   . Heart disease  Sister   . Diabetes Sister   . Hypertension Sister     Social History   Social History  . Marital Status: Single    Spouse Name: N/A  . Number of Children: N/A  . Years of Education: N/A   Occupational History  . Not on file.   Social History Main Topics  . Smoking status: Former Smoker    Quit date: 11/22/1997  . Smokeless tobacco: Never Used  . Alcohol Use: No  . Drug Use: No  . Sexual Activity: No   Other Topics Concern  . Not on file   Social History Narrative    Review of Systems: See HPI, otherwise negative ROS  Physical Exam: BP 154/89 mmHg   Pulse 110  Temp(Src) 97.3 F (36.3 C)  Resp 16  Ht 5\' 1"  (1.549 m)  Wt 169 lb (76.658 kg)  BMI 31.95 kg/m2  SpO2 99%  LMP  (LMP Unknown) General:   Alert,  pleasant and cooperative in NAD Head:  Normocephalic and atraumatic. Neck:  Supple; no masses or thyromegaly. Lungs:  Clear throughout to auscultation.    Heart:  Regular rate and rhythm. Abdomen:  Soft, nontender and nondistended. Normal bowel sounds, without guarding, and without rebound.   Neurologic:  Alert and  oriented x4;  grossly normal neurologically.  Impression/Plan: Tina Mcdonald is now here to undergo a screening colonoscopy.  Risks, benefits, and alternatives regarding colonoscopy have been reviewed with the patient.  Questions have been answered.  All parties agreeable.

## 2015-12-10 NOTE — Op Note (Addendum)
[facility] Gastroenterology Patient Name: Tina Mcdonald Procedure Date: 12/10/2015 7:23 AM MRN: [id number] Account #: [id number] Date of Birth: [birth year] Admit Type: Outpatient Age: 75 Room: [facility] OR ROOM 01 Gender: Female Note Status: Finalized Procedure:            Colonoscopy Indications:          Screening for colorectal malignant neoplasm Providers:            Lucilla Lame, MD Referring MD:         Kathrine Haddock, PA (Referring MD) Medicines:            Propofol per Anesthesia Complications:        No immediate complications. Procedure:            Pre-Anesthesia Assessment:                       - Prior to the procedure, a History and Physical was                        performed, and patient medications and allergies were                        reviewed. The patient's tolerance of previous                        anesthesia was also reviewed. The risks and benefits of                        the procedure and the sedation options and risks were                        discussed with the patient. All questions were                        answered, and informed consent was obtained. Prior                        Anticoagulants: The patient has taken no previous                        anticoagulant or antiplatelet agents. ASA Grade                        Assessment: II - A patient with mild systemic disease.                        After reviewing the risks and benefits, the patient was                        deemed in satisfactory condition to undergo the                        procedure.                       After obtaining informed consent, the colonoscope was                        passed under direct vision. Throughout the procedure,  the patient's blood pressure, pulse, and oxygen                        saturations were monitored continuously. The Olympus CF                        H180AL colonoscope (S#: [id number]) was introduced through                         the anus and advanced to the the cecum, identified by                        appendiceal orifice and ileocecal valve. The                        colonoscopy was performed without difficulty. The                        patient tolerated the procedure well. The quality of                        the bowel preparation was excellent. Findings:      The perianal and digital rectal examinations were normal.      A 4 mm polyp was found in the sigmoid colon. The polyp was sessile. The       polyp was removed with a cold snare. Resection and retrieval were       complete.      Multiple small and large-mouthed diverticula were found in the sigmoid       colon and descending colon.      Non-bleeding internal hemorrhoids were found during retroflexion. The       hemorrhoids were Grade II (internal hemorrhoids that prolapse but reduce       spontaneously). Impression:           - One 4 mm polyp in the sigmoid colon, removed with a                        cold snare. Resected and retrieved.                       - Diverticulosis in the sigmoid colon and in the                        descending colon.                       - Non-bleeding internal hemorrhoids. Recommendation:       - Await pathology results.                       - Repeat colonoscopy in 5 years if polyp adenoma and 10                        years if hyperplastic Procedure Code(s):    --- Professional ---                       [phone], Colonoscopy, flexible; with removal of tumor(s),  polyp(s), or other lesion(s) by snare technique Diagnosis Code(s):    --- Professional ---                       Z12.11, Encounter for screening for malignant neoplasm                        of colon                       D12.5, Benign neoplasm of sigmoid colon CPT copyright 2016 American Medical Association. All rights reserved. The codes documented in this report are preliminary and upon coder review may  be  revised to meet current compliance requirements. Lucilla Lame, MD 12/10/2015 8:03:10 AM This report has been signed electronically. Number of Addenda: 0 Note Initiated On: 12/10/2015 7:23 AM Scope Withdrawal Time: 0 hours 7 minutes 25 seconds  Total Procedure Duration: 0 hours 13 minutes 7 seconds       [facility]

## 2015-12-11 ENCOUNTER — Encounter: Payer: Self-pay | Admitting: Gastroenterology

## 2015-12-12 ENCOUNTER — Encounter: Payer: Self-pay | Admitting: Gastroenterology

## 2015-12-13 ENCOUNTER — Encounter: Payer: Self-pay | Admitting: Gastroenterology

## 2015-12-17 ENCOUNTER — Telehealth: Payer: Self-pay | Admitting: Unknown Physician Specialty

## 2015-12-17 NOTE — Telephone Encounter (Signed)
Pt called stated she wants to get her B12 injection. Stated she was told to call and check to see if an order was in. Please call pt and follow up. Thanks.

## 2015-12-17 NOTE — Telephone Encounter (Signed)
Called and let patient know it was OK for her to come in for monthly b12 injection.

## 2015-12-24 ENCOUNTER — Ambulatory Visit (INDEPENDENT_AMBULATORY_CARE_PROVIDER_SITE_OTHER): Payer: Medicare Other

## 2015-12-24 DIAGNOSIS — E538 Deficiency of other specified B group vitamins: Secondary | ICD-10-CM

## 2015-12-24 MED ORDER — CYANOCOBALAMIN 1000 MCG/ML IJ SOLN
1000.0000 ug | INTRAMUSCULAR | Status: DC
  Administered 2015-12-24 – 2020-08-17 (×21): 1000 ug via INTRAMUSCULAR

## 2015-12-27 ENCOUNTER — Other Ambulatory Visit: Payer: Self-pay | Admitting: Unknown Physician Specialty

## 2016-01-16 ENCOUNTER — Other Ambulatory Visit: Payer: Self-pay | Admitting: Unknown Physician Specialty

## 2016-01-24 ENCOUNTER — Ambulatory Visit (INDEPENDENT_AMBULATORY_CARE_PROVIDER_SITE_OTHER): Payer: Medicare Other

## 2016-01-24 ENCOUNTER — Ambulatory Visit: Payer: Medicare Other

## 2016-01-24 DIAGNOSIS — E538 Deficiency of other specified B group vitamins: Secondary | ICD-10-CM

## 2016-02-13 ENCOUNTER — Other Ambulatory Visit: Payer: Self-pay | Admitting: Unknown Physician Specialty

## 2016-02-13 ENCOUNTER — Other Ambulatory Visit: Payer: Self-pay | Admitting: Family Medicine

## 2016-02-14 NOTE — Telephone Encounter (Signed)
Your patient 

## 2016-02-15 ENCOUNTER — Ambulatory Visit (INDEPENDENT_AMBULATORY_CARE_PROVIDER_SITE_OTHER): Payer: Medicare Other | Admitting: Unknown Physician Specialty

## 2016-02-15 ENCOUNTER — Other Ambulatory Visit: Payer: Self-pay

## 2016-02-15 ENCOUNTER — Encounter: Payer: Self-pay | Admitting: Unknown Physician Specialty

## 2016-02-15 VITALS — BP 130/77 | HR 87 | Temp 98.3°F | Ht 61.0 in | Wt 176.6 lb

## 2016-02-15 DIAGNOSIS — M170 Bilateral primary osteoarthritis of knee: Secondary | ICD-10-CM | POA: Diagnosis not present

## 2016-02-15 DIAGNOSIS — R05 Cough: Secondary | ICD-10-CM

## 2016-02-15 DIAGNOSIS — E538 Deficiency of other specified B group vitamins: Secondary | ICD-10-CM | POA: Diagnosis not present

## 2016-02-15 DIAGNOSIS — E1122 Type 2 diabetes mellitus with diabetic chronic kidney disease: Secondary | ICD-10-CM | POA: Diagnosis not present

## 2016-02-15 DIAGNOSIS — Z1239 Encounter for other screening for malignant neoplasm of breast: Secondary | ICD-10-CM

## 2016-02-15 DIAGNOSIS — I10 Essential (primary) hypertension: Secondary | ICD-10-CM

## 2016-02-15 DIAGNOSIS — R059 Cough, unspecified: Secondary | ICD-10-CM

## 2016-02-15 LAB — MICROALBUMIN, URINE WAIVED
CREATININE, URINE WAIVED: 200 mg/dL (ref 10–300)
MICROALB, UR WAIVED: 30 mg/L — AB (ref 0–19)
Microalb/Creat Ratio: <30 mg/g (ref <30)

## 2016-02-15 LAB — BAYER DCA HB A1C WAIVED: HB A1C (BAYER DCA - WAIVED): 7.4 % — ABNORMAL HIGH (ref <7.0)

## 2016-02-15 LAB — HEMOGLOBIN A1C: Hemoglobin A1C: 7.4

## 2016-02-15 MED ORDER — CYANOCOBALAMIN 1000 MCG/ML IJ SOLN
1000.0000 ug | INTRAMUSCULAR | Status: AC
  Administered 2016-02-15 – 2016-11-24 (×8): 1000 ug via INTRAMUSCULAR

## 2016-02-15 NOTE — Assessment & Plan Note (Signed)
Stable, continue present medications.   

## 2016-02-15 NOTE — Assessment & Plan Note (Signed)
Refer to Dr Noemi Chapel at pt request

## 2016-02-15 NOTE — Assessment & Plan Note (Addendum)
Hgb A1C is 7.4.   She will try to exercise more

## 2016-02-15 NOTE — Progress Notes (Signed)
BP 130/77 (BP Location: Left Arm, Patient Position: Sitting, Cuff Size: Large)   Pulse 87   Temp 98.3 F (36.8 C)   Ht 5\' 1"  (1.549 m)   Wt 176 lb 9.6 oz (80.1 kg)   LMP  (LMP Unknown)   SpO2 96%   BMI 33.37 kg/m    Subjective:    Patient ID: Tina Mcdonald, female    DOB: 1940/08/24, 75 y.o.   MRN: [id number]  HPI: Tina Mcdonald is a 75 y.o. female  Chief Complaint  Patient presents with  . Hyperlipidemia  . Hypertension  . Diabetes  . Gastroesophageal Reflux   Diabetes: Using medications without difficulties No hypoglycemic episodes No hyperglycemic episodes Feet problems: none Blood Sugars averaging: Pretty good until "these 3 funerals I went to." eye exam within last year Last Hgb A1C: 7.2  Hypertension  Using medications without difficulty Average home BPs Not checking Using medication without problems or lightheadedness No chest pain with exertion or shortness of breath No Edema  Elevated Cholesterol Using medications without problems No Muscle aches  Diet: "doing good" with blood sugars below 110 Exercise: Walking a little more  Bilateral knee pain Pt with bilateral knee pain.  She would like to be referred to Dr. Noemi Chapel for evaluation of bilaterl knee pain with diagnosis of OA in the past.    Relevant past medical, surgical, family and social history reviewed and updated as indicated. Interim medical history since our last visit reviewed. Allergies and medications reviewed and updated.  Review of Systems  Respiratory:       Chronic dry cough with thick mucous    Per HPI unless specifically indicated above     Objective:    BP 130/77 (BP Location: Left Arm, Patient Position: Sitting, Cuff Size: Large)   Pulse 87   Temp 98.3 F (36.8 C)   Ht 5\' 1"  (1.549 m)   Wt 176 lb 9.6 oz (80.1 kg)   LMP  (LMP Unknown)   SpO2 96%   BMI 33.37 kg/m   Wt Readings from Last 3 Encounters:  02/15/16 176 lb 9.6 oz (80.1 kg)  12/10/15 169 lb (76.7  kg)  11/20/15 175 lb 3.2 oz (79.5 kg)    Physical Exam  Constitutional: She is oriented to person, place, and time. She appears well-developed and well-nourished. No distress.  HENT:  Head: Normocephalic and atraumatic.  Right Ear: Tympanic membrane and ear canal normal.  Left Ear: Tympanic membrane and ear canal normal.  Nose: Mucosal edema present.  Mouth/Throat: No oropharyngeal exudate or posterior oropharyngeal erythema.  Eyes: Conjunctivae and lids are normal. Right eye exhibits no discharge. Left eye exhibits no discharge. No scleral icterus.  Neck: Normal range of motion. Neck supple. No JVD present. Carotid bruit is not present.  Cardiovascular: Normal rate, regular rhythm and normal heart sounds.   Pulmonary/Chest: Effort normal and breath sounds normal.  Abdominal: Normal appearance. There is no splenomegaly or hepatomegaly.  Musculoskeletal: Normal range of motion.  Neurological: She is alert and oriented to person, place, and time.  Skin: Skin is warm, dry and intact. No rash noted. No pallor.  Psychiatric: She has a normal mood and affect. Her behavior is normal. Judgment and thought content normal.    Results for orders placed or performed during the hospital encounter of 12/10/15  Glucose, capillary  Result Value Ref Range   Glucose-Capillary 127 (H) 65 - 99 mg/dL  Glucose, capillary  Result Value Ref Range   Glucose-Capillary 119 (  H) 65 - 99 mg/dL      Assessment & Plan:   Problem List Items Addressed This Visit      Unprioritized   Cough    Restart Flonase and use daily      Relevant Orders   Ambulatory referral to ENT   Diabetes mellitus with chronic kidney disease ([place]) - Primary    Hgb A1C is 7.4.   She will try to exercise more      Relevant Orders   Bayer DCA Hb A1c Waived   Comprehensive metabolic panel   Microalbumin, Urine Waived   Hypertension    Stable, continue present medications.        Relevant Orders   Comprehensive metabolic  panel   Osteoarthritis of both knees    Refer to Dr Noemi Chapel at pt request      Relevant Orders   Ambulatory referral to Orthopedic Surgery   Vitamin B12 deficiency   Relevant Medications   cyanocobalamin ((VITAMIN B-12)) injection 1,000 mcg    Other Visit Diagnoses    Breast cancer screening       reminded to get mammogram       Follow up plan: Return in about 3 months (around 05/17/2016).

## 2016-02-15 NOTE — Assessment & Plan Note (Signed)
Restart Flonase and use daily

## 2016-02-16 LAB — COMPREHENSIVE METABOLIC PANEL
A/G RATIO: 1.7 (ref 1.2–2.2)
ALT: 20 IU/L (ref 0–32)
AST: 17 IU/L (ref 0–40)
Albumin: 4.1 g/dL (ref 3.5–4.8)
Alkaline Phosphatase: 65 IU/L (ref 39–117)
BUN/Creatinine Ratio: 27 (ref 12–28)
BUN: 26 mg/dL (ref 8–27)
Bilirubin Total: 0.2 mg/dL (ref 0.0–1.2)
CALCIUM: 9.6 mg/dL (ref 8.7–10.3)
CO2: 22 mmol/L (ref 18–29)
CREATININE: 0.96 mg/dL (ref 0.57–1.00)
Chloride: 100 mmol/L (ref 96–106)
GFR calc Af Amer: 68 mL/min/{1.73_m2} (ref >59)
GFR, EST NON AFRICAN AMERICAN: 59 mL/min/{1.73_m2} — AB (ref >59)
GLUCOSE: 124 mg/dL — AB (ref 65–99)
Globulin, Total: 2.4 g/dL (ref 1.5–4.5)
POTASSIUM: 4.4 mmol/L (ref 3.5–5.2)
Sodium: 141 mmol/L (ref 134–144)
Total Protein: 6.5 g/dL (ref 6.0–8.5)

## 2016-02-20 ENCOUNTER — Ambulatory Visit
Admission: RE | Admit: 2016-02-20 | Discharge: 2016-02-20 | Disposition: A | Discharge status: Home / Self Care | Payer: Medicare Other | Source: Ambulatory Visit | Attending: Unknown Physician Specialty | Admitting: Unknown Physician Specialty

## 2016-02-20 ENCOUNTER — Other Ambulatory Visit: Payer: Self-pay | Admitting: Unknown Physician Specialty

## 2016-02-20 DIAGNOSIS — Z780 Asymptomatic menopausal state: Secondary | ICD-10-CM | POA: Insufficient documentation

## 2016-02-20 DIAGNOSIS — Z1239 Encounter for other screening for malignant neoplasm of breast: Secondary | ICD-10-CM

## 2016-02-20 DIAGNOSIS — Z1231 Encounter for screening mammogram for malignant neoplasm of breast: Secondary | ICD-10-CM | POA: Diagnosis not present

## 2016-02-20 DIAGNOSIS — E2839 Other primary ovarian failure: Secondary | ICD-10-CM | POA: Insufficient documentation

## 2016-02-26 ENCOUNTER — Ambulatory Visit: Payer: Medicare Other

## 2016-02-26 DIAGNOSIS — K219 Gastro-esophageal reflux disease without esophagitis: Secondary | ICD-10-CM | POA: Diagnosis not present

## 2016-02-26 DIAGNOSIS — E784 Other hyperlipidemia: Secondary | ICD-10-CM | POA: Diagnosis not present

## 2016-02-26 DIAGNOSIS — I209 Angina pectoris, unspecified: Secondary | ICD-10-CM | POA: Diagnosis not present

## 2016-02-26 DIAGNOSIS — E119 Type 2 diabetes mellitus without complications: Secondary | ICD-10-CM | POA: Diagnosis not present

## 2016-02-26 DIAGNOSIS — I10 Essential (primary) hypertension: Secondary | ICD-10-CM | POA: Diagnosis not present

## 2016-02-27 DIAGNOSIS — M25562 Pain in left knee: Secondary | ICD-10-CM | POA: Diagnosis not present

## 2016-02-27 DIAGNOSIS — M25512 Pain in left shoulder: Secondary | ICD-10-CM | POA: Diagnosis not present

## 2016-02-27 DIAGNOSIS — M25561 Pain in right knee: Secondary | ICD-10-CM | POA: Diagnosis not present

## 2016-03-10 DIAGNOSIS — E113393 Type 2 diabetes mellitus with moderate nonproliferative diabetic retinopathy without macular edema, bilateral: Secondary | ICD-10-CM | POA: Diagnosis not present

## 2016-03-10 DIAGNOSIS — H35371 Puckering of macula, right eye: Secondary | ICD-10-CM | POA: Diagnosis not present

## 2016-03-18 ENCOUNTER — Encounter (INDEPENDENT_AMBULATORY_CARE_PROVIDER_SITE_OTHER): Payer: Medicare Other | Admitting: Ophthalmology

## 2016-03-18 DIAGNOSIS — H353121 Nonexudative age-related macular degeneration, left eye, early dry stage: Secondary | ICD-10-CM

## 2016-03-18 DIAGNOSIS — H35341 Macular cyst, hole, or pseudohole, right eye: Secondary | ICD-10-CM | POA: Diagnosis not present

## 2016-03-18 DIAGNOSIS — I10 Essential (primary) hypertension: Secondary | ICD-10-CM

## 2016-03-18 DIAGNOSIS — H35033 Hypertensive retinopathy, bilateral: Secondary | ICD-10-CM | POA: Diagnosis not present

## 2016-03-20 ENCOUNTER — Other Ambulatory Visit: Payer: Self-pay | Admitting: Family Medicine

## 2016-03-26 ENCOUNTER — Ambulatory Visit: Payer: Medicare Other

## 2016-03-27 ENCOUNTER — Ambulatory Visit: Payer: Medicare Other

## 2016-03-27 ENCOUNTER — Ambulatory Visit (INDEPENDENT_AMBULATORY_CARE_PROVIDER_SITE_OTHER): Payer: Medicare Other

## 2016-03-27 DIAGNOSIS — E538 Deficiency of other specified B group vitamins: Secondary | ICD-10-CM

## 2016-04-16 ENCOUNTER — Other Ambulatory Visit: Payer: Self-pay | Admitting: Family Medicine

## 2016-04-25 ENCOUNTER — Ambulatory Visit (INDEPENDENT_AMBULATORY_CARE_PROVIDER_SITE_OTHER): Payer: Medicare Other

## 2016-04-25 DIAGNOSIS — E538 Deficiency of other specified B group vitamins: Secondary | ICD-10-CM

## 2016-05-16 ENCOUNTER — Other Ambulatory Visit: Payer: Self-pay | Admitting: Unknown Physician Specialty

## 2016-05-20 ENCOUNTER — Ambulatory Visit: Payer: Medicare Other | Admitting: Unknown Physician Specialty

## 2016-05-20 ENCOUNTER — Ambulatory Visit (INDEPENDENT_AMBULATORY_CARE_PROVIDER_SITE_OTHER): Payer: Medicare Other | Admitting: Unknown Physician Specialty

## 2016-05-20 ENCOUNTER — Encounter: Payer: Self-pay | Admitting: Unknown Physician Specialty

## 2016-05-20 VITALS — BP 126/73 | HR 78 | Temp 97.7°F | Wt 182.6 lb

## 2016-05-20 DIAGNOSIS — Z5181 Encounter for therapeutic drug level monitoring: Secondary | ICD-10-CM

## 2016-05-20 DIAGNOSIS — I10 Essential (primary) hypertension: Secondary | ICD-10-CM

## 2016-05-20 DIAGNOSIS — E1122 Type 2 diabetes mellitus with diabetic chronic kidney disease: Secondary | ICD-10-CM

## 2016-05-20 DIAGNOSIS — N183 Chronic kidney disease, stage 3 (moderate): Secondary | ICD-10-CM

## 2016-05-20 DIAGNOSIS — E7800 Pure hypercholesterolemia, unspecified: Secondary | ICD-10-CM | POA: Diagnosis not present

## 2016-05-20 DIAGNOSIS — IMO0002 Reserved for concepts with insufficient information to code with codable children: Secondary | ICD-10-CM

## 2016-05-20 DIAGNOSIS — E782 Mixed hyperlipidemia: Secondary | ICD-10-CM | POA: Diagnosis not present

## 2016-05-20 DIAGNOSIS — J320 Chronic maxillary sinusitis: Secondary | ICD-10-CM

## 2016-05-20 DIAGNOSIS — E1165 Type 2 diabetes mellitus with hyperglycemia: Secondary | ICD-10-CM | POA: Diagnosis not present

## 2016-05-20 LAB — BAYER DCA HB A1C WAIVED: HB A1C: 7.3 % — AB (ref <7.0)

## 2016-05-20 MED ORDER — AZITHROMYCIN 250 MG PO TABS
ORAL_TABLET | ORAL | 0 refills | Status: DC
Start: 2016-05-20 — End: 2016-08-20

## 2016-05-20 NOTE — Progress Notes (Signed)
BP 126/73 (BP Location: Left Arm, Cuff Size: Large)   Pulse 78   Temp 97.7 F (36.5 C)   Wt 182 lb 9.6 oz (82.8 kg)   LMP  (LMP Unknown)   SpO2 96%   BMI 34.50 kg/m    Subjective:    Patient ID: Tina Mcdonald, female    DOB: 08/25/1940, 75 y.o.   MRN: [id number]  HPI: Tina Mcdonald is a 75 y.o. female  Chief Complaint  Patient presents with  . Diabetes  . Hyperlipidemia  . Hypertension  . Shortness of Breath    pt states she has been geting SOB for about 3 weeks  . Ear Problem    pt states she hears a faint pumping noise in her right ear    Diabetes: Using medications without difficulties No hypoglycemic episodes No hyperglycemic episodes Feet problems: none Blood Sugars averaging:Not checking eye exam within last year Last Hgb A1C: 7.4  Hypertension  Using medications without difficulty Average home BPs Not checking  Using medication without problems or lightheadedness No chest pain with exertion No Edema Woke up one day 2-3 weeks ago dizzy and SOB.  Ears were throbbing  Elevated Cholesterol Using medications without problems No Muscle aches     Relevant past medical, surgical, family and social history reviewed and updated as indicated. Interim medical history since our last visit reviewed. Allergies and medications reviewed and updated.  Review of Systems  Constitutional: Negative for chills and fever.  HENT:       Pulsating in right ear  Eyes: Negative.   Respiratory: Positive for cough.   Cardiovascular: Negative.   Gastrointestinal: Negative.   Skin: Negative.   Neurological: Positive for dizziness.  Hematological: Negative.   Psychiatric/Behavioral: Negative.     Per HPI unless specifically indicated above     Objective:    BP 126/73 (BP Location: Left Arm, Cuff Size: Large)   Pulse 78   Temp 97.7 F (36.5 C)   Wt 182 lb 9.6 oz (82.8 kg)   LMP  (LMP Unknown)   SpO2 96%   BMI 34.50 kg/m   Wt Readings from Last 3  Encounters:  05/20/16 182 lb 9.6 oz (82.8 kg)  02/15/16 176 lb 9.6 oz (80.1 kg)  12/10/15 169 lb (76.7 kg)    Physical Exam  Constitutional: She is oriented to person, place, and time. She appears well-developed and well-nourished. No distress.  HENT:  Head: Normocephalic and atraumatic.  Eyes: Conjunctivae and lids are normal. Right eye exhibits no discharge. Left eye exhibits no discharge. No scleral icterus.  Neck: Normal range of motion. Neck supple. No JVD present. Carotid bruit is not present.  Cardiovascular: Normal rate, regular rhythm and normal heart sounds.   Pulmonary/Chest: Effort normal and breath sounds normal.  Abdominal: Normal appearance. There is no splenomegaly or hepatomegaly.  Musculoskeletal: Normal range of motion.  Neurological: She is alert and oriented to person, place, and time.  Skin: Skin is warm, dry and intact. No rash noted. No pallor.  Psychiatric: She has a normal mood and affect. Her behavior is normal. Judgment and thought content normal.    Results for orders placed or performed in visit on 02/15/16  Hemoglobin A1c  Result Value Ref Range   Hemoglobin A1C 7.4%       Assessment & Plan:   Problem List Items Addressed This Visit      Unprioritized   Hypercholesterolemia    Check next visit  Hyperlipidemia   Hypertension    Stable, continue present medications.        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  Continue present medications and improve diet and exercise       Other Visit Diagnoses    Medication monitoring encounter       Relevant Orders   Comprehensive metabolic panel   Maxillary sinusitis, unspecified chronicity       Relevant Medications   azithromycin (ZITHROMAX) 250 MG tablet       Follow up plan: Return in about 3 months (around 08/20/2016).

## 2016-05-20 NOTE — Assessment & Plan Note (Signed)
Hgb A1C is 7.3.  Continue present medications and improve diet and exercise

## 2016-05-20 NOTE — Assessment & Plan Note (Signed)
Stable, continue present medications.   

## 2016-05-20 NOTE — Assessment & Plan Note (Signed)
Check next visit

## 2016-05-21 LAB — COMPREHENSIVE METABOLIC PANEL
A/G RATIO: 1.6 (ref 1.2–2.2)
ALT: 15 IU/L (ref 0–32)
AST: 13 IU/L (ref 0–40)
Albumin: 4.1 g/dL (ref 3.5–4.8)
Alkaline Phosphatase: 63 IU/L (ref 39–117)
BUN / CREAT RATIO: 17 (ref 12–28)
BUN: 18 mg/dL (ref 8–27)
CALCIUM: 9.7 mg/dL (ref 8.7–10.3)
CHLORIDE: 102 mmol/L (ref 96–106)
CO2: 24 mmol/L (ref 18–29)
Creatinine, Ser: 1.05 mg/dL — ABNORMAL HIGH (ref 0.57–1.00)
GFR, EST AFRICAN AMERICAN: 61 mL/min/{1.73_m2} (ref >59)
GFR, EST NON AFRICAN AMERICAN: 53 mL/min/{1.73_m2} — AB (ref >59)
GLOBULIN, TOTAL: 2.5 g/dL (ref 1.5–4.5)
Glucose: 179 mg/dL — ABNORMAL HIGH (ref 65–99)
POTASSIUM: 4.4 mmol/L (ref 3.5–5.2)
SODIUM: 142 mmol/L (ref 134–144)
TOTAL PROTEIN: 6.6 g/dL (ref 6.0–8.5)

## 2016-05-26 ENCOUNTER — Ambulatory Visit (INDEPENDENT_AMBULATORY_CARE_PROVIDER_SITE_OTHER): Payer: Medicare Other

## 2016-05-26 DIAGNOSIS — E538 Deficiency of other specified B group vitamins: Secondary | ICD-10-CM | POA: Diagnosis not present

## 2016-06-23 ENCOUNTER — Other Ambulatory Visit: Payer: Self-pay | Admitting: Unknown Physician Specialty

## 2016-07-03 ENCOUNTER — Other Ambulatory Visit: Payer: Self-pay | Admitting: Unknown Physician Specialty

## 2016-07-23 ENCOUNTER — Other Ambulatory Visit: Payer: Self-pay | Admitting: Unknown Physician Specialty

## 2016-07-23 ENCOUNTER — Other Ambulatory Visit: Payer: Self-pay | Admitting: Family Medicine

## 2016-08-01 ENCOUNTER — Ambulatory Visit: Payer: Medicare Other

## 2016-08-04 ENCOUNTER — Ambulatory Visit (INDEPENDENT_AMBULATORY_CARE_PROVIDER_SITE_OTHER): Payer: Medicare Other

## 2016-08-04 ENCOUNTER — Ambulatory Visit: Payer: Medicare Other

## 2016-08-04 DIAGNOSIS — E538 Deficiency of other specified B group vitamins: Secondary | ICD-10-CM

## 2016-08-04 DIAGNOSIS — D649 Anemia, unspecified: Secondary | ICD-10-CM

## 2016-08-06 ENCOUNTER — Other Ambulatory Visit: Payer: Self-pay | Admitting: Unknown Physician Specialty

## 2016-08-19 ENCOUNTER — Other Ambulatory Visit: Payer: Self-pay | Admitting: Unknown Physician Specialty

## 2016-08-20 ENCOUNTER — Encounter: Payer: Self-pay | Admitting: Unknown Physician Specialty

## 2016-08-20 ENCOUNTER — Ambulatory Visit (INDEPENDENT_AMBULATORY_CARE_PROVIDER_SITE_OTHER): Payer: Medicare Other | Admitting: Unknown Physician Specialty

## 2016-08-20 VITALS — BP 133/74 | HR 88 | Temp 98.5°F | Wt 180.8 lb

## 2016-08-20 DIAGNOSIS — E1165 Type 2 diabetes mellitus with hyperglycemia: Secondary | ICD-10-CM | POA: Diagnosis not present

## 2016-08-20 DIAGNOSIS — E1122 Type 2 diabetes mellitus with diabetic chronic kidney disease: Secondary | ICD-10-CM

## 2016-08-20 DIAGNOSIS — R252 Cramp and spasm: Secondary | ICD-10-CM | POA: Diagnosis not present

## 2016-08-20 DIAGNOSIS — E782 Mixed hyperlipidemia: Secondary | ICD-10-CM | POA: Diagnosis not present

## 2016-08-20 DIAGNOSIS — N183 Chronic kidney disease, stage 3 (moderate): Secondary | ICD-10-CM

## 2016-08-20 DIAGNOSIS — IMO0002 Reserved for concepts with insufficient information to code with codable children: Secondary | ICD-10-CM

## 2016-08-20 DIAGNOSIS — I10 Essential (primary) hypertension: Secondary | ICD-10-CM | POA: Diagnosis not present

## 2016-08-20 LAB — BAYER DCA HB A1C WAIVED: HB A1C: 7.7 % — AB (ref <7.0)

## 2016-08-20 NOTE — Assessment & Plan Note (Signed)
Stable, continue current medications. Lipid panel results will be reviewed and communicated to patient when they return.

## 2016-08-20 NOTE — Patient Instructions (Addendum)
Take magnesium supplement at night to prevent leg cramps in the morning.

## 2016-08-20 NOTE — Assessment & Plan Note (Signed)
Stable at 133/74. Continue current medications

## 2016-08-20 NOTE — Progress Notes (Signed)
BP 133/74 (BP Location: Left Arm, Patient Position: Sitting, Cuff Size: Large)   Pulse 88   Temp 98.5 F (36.9 C)   Wt 180 lb 12.8 oz (82 kg)   LMP  (LMP Unknown)   SpO2 98%   BMI 34.16 kg/m    Subjective:    Patient ID: Tina Mcdonald, female    DOB: [birth year], 76 y.o.   MRN: [id number]  HPI: Tina Mcdonald is a 76 y.o. female  Chief Complaint  Patient presents with  . Diabetes  . Hyperlipidemia  . Hypertension  . Leg Cramps    pt states she has been noticing her right leg cramping in the mornings, states it has been going on for a while    Diabetes: Using Metformin 1000 mg BID without difficulties No hypoglycemic episodes No hyperglycemic episodes Feet problems: No numbness/tingling, ulcers, skin breakdown. Blood Sugars averaging: < 150 in the morning.  eye exam within last year on 09/04/2015 Last Hgb A1C: 7.3 on 05/20/16  Hypertension  Using HCTZ 25 mg and Metoprolol 50 mg without difficulty Average home BPs Does not check at home.   Using medication without problems or lightheadedness No chest pain with exertion or shortness of breath No Edema  Elevated Cholesterol  Using Atorvastatin 20 mg without problems No Muscle aches  Diet: Has not made any chances since last visit.  Exercise: Walk at work.   Leg Cramps Right lateral lower leg pain when she wakes up in the morning that has been occurring  "for a while" and she lays in bed for 5-10 minutes until it passes. States it is not induced by exercise or relieved with rest and denies noticing any nonhealing wounds or skin ulcers, skin discoloration, or temperature changes in her leg.   Relevant past medical, surgical, family and social history reviewed and updated as indicated. Interim medical history since our last visit reviewed. Allergies and medications reviewed and updated.  Review of Systems  Per HPI unless specifically indicated above     Objective:    BP 133/74 (BP Location: Left Arm, Patient  Position: Sitting, Cuff Size: Large)   Pulse 88   Temp 98.5 F (36.9 C)   Wt 180 lb 12.8 oz (82 kg)   LMP  (LMP Unknown)   SpO2 98%   BMI 34.16 kg/m   Wt Readings from Last 3 Encounters:  08/20/16 180 lb 12.8 oz (82 kg)  05/20/16 182 lb 9.6 oz (82.8 kg)  02/15/16 176 lb 9.6 oz (80.1 kg)    Physical Exam  Constitutional: She is oriented to person, place, and time. She appears well-developed and well-nourished. No distress.  HENT:  Head: Normocephalic and atraumatic.  Eyes: Conjunctivae are normal. Right eye exhibits no discharge. Left eye exhibits no discharge. No scleral icterus.  Neck: Normal range of motion. Neck supple. No JVD present.  Cardiovascular: Normal rate, regular rhythm, normal heart sounds and intact distal pulses.   Pulses:      Radial pulses are 2+ on the right side.       Dorsalis pedis pulses are 2+ on the right side.       Posterior tibial pulses are 2+ on the right side.  Pulmonary/Chest: Effort normal and breath sounds normal. No respiratory distress. She has no wheezes. She has no rales.  Abdominal: Soft. Bowel sounds are normal.  Musculoskeletal: Normal range of motion.  Neurological: She is alert and oriented to person, place, and time.  Skin: Skin is warm, dry and  intact.  Psychiatric: She has a normal mood and affect. Her behavior is normal. Judgment and thought content normal.      Assessment & Plan:   Problem List Items Addressed This Visit    Uncontrolled type 2 diabetes mellitus with chronic kidney disease ([place]) - Primary    A1C at 7.7 today. Discussed with patient the option of adding another medication but [facility] and try diet and exercise modification for another 3 months.       Relevant Orders   Bayer DCA Hb A1c Waived   Lipid Panel w/o Chol/HDL Ratio   Comprehensive metabolic panel   Amb Referral to Nutrition [facility] Diabetic [facility]   Hypertension    Stable at 133/74. Continue current medications        Relevant Orders   Comprehensive metabolic panel   Hyperlipidemia    Stable, continue current medications. Lipid panel results will be reviewed and communicated to patient when they return.      Relevant Orders   Lipid Panel w/o Chol/HDL Ratio    Other Visit Diagnoses    Cramp in lower leg       Take magnesium supplement at night before bedtime to alleviate cramps in the morning.        Follow up plan: Return in about 3 months (around 11/17/2016) for A1C check.

## 2016-08-20 NOTE — Assessment & Plan Note (Signed)
A1C at 7.7 today. Discussed with patient the option of adding another medication but patient would [facility] and try diet and exercise modification for another 3 months.

## 2016-08-21 ENCOUNTER — Encounter: Payer: Self-pay | Admitting: Unknown Physician Specialty

## 2016-08-21 LAB — COMPREHENSIVE METABOLIC PANEL
ALT: 10 IU/L (ref 0–32)
AST: 13 IU/L (ref 0–40)
Albumin/Globulin Ratio: 1.7 (ref 1.2–2.2)
Albumin: 4 g/dL (ref 3.5–4.8)
Alkaline Phosphatase: 67 IU/L (ref 39–117)
BILIRUBIN TOTAL: 0.2 mg/dL (ref 0.0–1.2)
BUN/Creatinine Ratio: 18 (ref 12–28)
BUN: 19 mg/dL (ref 8–27)
CHLORIDE: 96 mmol/L (ref 96–106)
CO2: 22 mmol/L (ref 18–29)
Calcium: 9.6 mg/dL (ref 8.7–10.3)
Creatinine, Ser: 1.04 mg/dL — ABNORMAL HIGH (ref 0.57–1.00)
GFR calc Af Amer: 61 mL/min/{1.73_m2} (ref >59)
GFR calc non Af Amer: 53 mL/min/{1.73_m2} — ABNORMAL LOW (ref >59)
GLUCOSE: 138 mg/dL — AB (ref 65–99)
Globulin, Total: 2.3 g/dL (ref 1.5–4.5)
POTASSIUM: 3.8 mmol/L (ref 3.5–5.2)
Sodium: 137 mmol/L (ref 134–144)
Total Protein: 6.3 g/dL (ref 6.0–8.5)

## 2016-08-21 LAB — LIPID PANEL W/O CHOL/HDL RATIO
CHOLESTEROL TOTAL: 179 mg/dL (ref 100–199)
HDL: 42 mg/dL (ref >39)
LDL CALC: 111 mg/dL — AB (ref 0–99)
TRIGLYCERIDES: 131 mg/dL (ref 0–149)
VLDL CHOLESTEROL CAL: 26 mg/dL (ref 5–40)

## 2016-08-22 ENCOUNTER — Telehealth: Payer: Self-pay

## 2016-08-22 NOTE — Telephone Encounter (Signed)
Patient called and asked if her diabetic supplies could be sent to her pharmacy.

## 2016-08-22 NOTE — Telephone Encounter (Signed)
Form filled out, signed by provider, and faxed to pharmacy.

## 2016-08-22 NOTE — Telephone Encounter (Signed)
Called and spoke with patient to clarify what supplies she is needing and what brand she uses. Patient states that she believes she is using one touch ultra, she stated that she needs test strips sent in, stated that she test once per day, and that her pharmacy is [facility]. Will fill out form, have provider sign, and fax to pharmacy.

## 2016-09-02 ENCOUNTER — Ambulatory Visit: Payer: Medicare Other

## 2016-09-04 ENCOUNTER — Ambulatory Visit: Payer: Medicare Other

## 2016-09-08 ENCOUNTER — Ambulatory Visit (INDEPENDENT_AMBULATORY_CARE_PROVIDER_SITE_OTHER): Payer: Medicare Other

## 2016-09-08 DIAGNOSIS — E538 Deficiency of other specified B group vitamins: Secondary | ICD-10-CM | POA: Diagnosis not present

## 2016-09-15 ENCOUNTER — Ambulatory Visit (INDEPENDENT_AMBULATORY_CARE_PROVIDER_SITE_OTHER): Payer: Medicare Other | Admitting: Ophthalmology

## 2016-09-27 ENCOUNTER — Other Ambulatory Visit: Payer: Self-pay | Admitting: Unknown Physician Specialty

## 2016-10-07 ENCOUNTER — Other Ambulatory Visit: Payer: Self-pay | Admitting: Family Medicine

## 2016-10-13 ENCOUNTER — Ambulatory Visit (INDEPENDENT_AMBULATORY_CARE_PROVIDER_SITE_OTHER): Payer: Medicare Other

## 2016-10-13 ENCOUNTER — Telehealth: Payer: Self-pay | Admitting: Unknown Physician Specialty

## 2016-10-13 DIAGNOSIS — E538 Deficiency of other specified B group vitamins: Secondary | ICD-10-CM | POA: Diagnosis not present

## 2016-10-13 NOTE — Telephone Encounter (Signed)
ERROR

## 2016-11-03 ENCOUNTER — Other Ambulatory Visit: Payer: Self-pay | Admitting: Unknown Physician Specialty

## 2016-11-19 ENCOUNTER — Ambulatory Visit: Payer: Medicare Other | Admitting: Unknown Physician Specialty

## 2016-11-24 ENCOUNTER — Encounter: Payer: Self-pay | Admitting: Unknown Physician Specialty

## 2016-11-24 ENCOUNTER — Ambulatory Visit (INDEPENDENT_AMBULATORY_CARE_PROVIDER_SITE_OTHER): Payer: Medicare Other | Admitting: Unknown Physician Specialty

## 2016-11-24 VITALS — BP 132/75 | HR 82 | Temp 98.3°F | Ht 61.1 in | Wt 178.4 lb

## 2016-11-24 DIAGNOSIS — N183 Chronic kidney disease, stage 3 (moderate): Secondary | ICD-10-CM

## 2016-11-24 DIAGNOSIS — N3941 Urge incontinence: Secondary | ICD-10-CM | POA: Diagnosis not present

## 2016-11-24 DIAGNOSIS — I129 Hypertensive chronic kidney disease with stage 1 through stage 4 chronic kidney disease, or unspecified chronic kidney disease: Secondary | ICD-10-CM | POA: Diagnosis not present

## 2016-11-24 DIAGNOSIS — IMO0002 Reserved for concepts with insufficient information to code with codable children: Secondary | ICD-10-CM

## 2016-11-24 DIAGNOSIS — E538 Deficiency of other specified B group vitamins: Secondary | ICD-10-CM

## 2016-11-24 DIAGNOSIS — E1165 Type 2 diabetes mellitus with hyperglycemia: Secondary | ICD-10-CM

## 2016-11-24 DIAGNOSIS — E1122 Type 2 diabetes mellitus with diabetic chronic kidney disease: Secondary | ICD-10-CM

## 2016-11-24 LAB — BAYER DCA HB A1C WAIVED: HB A1C (BAYER DCA - WAIVED): 7.1 % — ABNORMAL HIGH (ref <7.0)

## 2016-11-24 MED ORDER — OXYBUTYNIN CHLORIDE 5 MG PO TABS: 5.0000 mg | ORAL_TABLET | Freq: Three times a day (TID) | ORAL | 1 refills | Status: DC

## 2016-11-24 NOTE — Assessment & Plan Note (Signed)
Start Oxybutynin.  Encourage her to use it prn rather than daily due to side effects.

## 2016-11-24 NOTE — Assessment & Plan Note (Signed)
Stable, continue present medications.   

## 2016-11-24 NOTE — Assessment & Plan Note (Signed)
Hgb A1C is 7.1.  Will continue with lifestyle changes

## 2016-11-24 NOTE — Progress Notes (Signed)
BP 132/75   Pulse 82   Temp 98.3 F (36.8 C)   Ht 5' 1.1" (1.552 m)   Wt 178 lb 6.4 oz (80.9 kg)   LMP  (LMP Unknown)   SpO2 96%   BMI 33.60 kg/m    Subjective:    Patient ID: Tina Mcdonald, female    DOB: 1941-04-14, 76 y.o.   MRN: [id number]  HPI: Tina Mcdonald is a 76 y.o. female  Chief Complaint  Patient presents with  . Diabetes  . Hyperlipidemia  . Hypertension   Diabetes: Using medications without difficulties.  Not controlled last visit.  [facility] but she is caring for her sister.   No hypoglycemic episodes No hyperglycemic episodes Feet problems: Blood Sugars averaging: eye exam within last year Last Hgb A1C:7.7  Hypertension  Using medications without difficulty Average home BPs Not checking  Using medication without problems or lightheadedness No chest pain with exertion or shortness of breath No Edema  Elevated Cholesterol Using medications without problems No Muscle aches  Diet: Exercise: No changes  Urge incontinence Difficulty "holding water." She had a bladder tuck at one time.     Relevant past medical, surgical, family and social history reviewed and updated as indicated. Interim medical history since our last visit reviewed. Allergies and medications reviewed and updated.  Review of Systems  Per HPI unless specifically indicated above     Objective:    BP 132/75   Pulse 82   Temp 98.3 F (36.8 C)   Ht 5' 1.1" (1.552 m)   Wt 178 lb 6.4 oz (80.9 kg)   LMP  (LMP Unknown)   SpO2 96%   BMI 33.60 kg/m   Wt Readings from Last 3 Encounters:  11/24/16 178 lb 6.4 oz (80.9 kg)  08/20/16 180 lb 12.8 oz (82 kg)  05/20/16 182 lb 9.6 oz (82.8 kg)    Physical Exam  Constitutional: She is oriented to person, place, and time. She appears well-developed and well-nourished. No distress.  HENT:  Head: Normocephalic and atraumatic.  Eyes: Conjunctivae and lids are normal. Right eye exhibits no discharge.  Left eye exhibits no discharge. No scleral icterus.  Neck: Normal range of motion. Neck supple. No JVD present. Carotid bruit is not present.  Cardiovascular: Normal rate, regular rhythm and normal heart sounds.   Pulmonary/Chest: Effort normal and breath sounds normal.  Abdominal: Normal appearance. There is no splenomegaly or hepatomegaly.  Musculoskeletal: Normal range of motion.  Neurological: She is alert and oriented to person, place, and time.  Skin: Skin is warm, dry and intact. No rash noted. No pallor.  Psychiatric: She has a normal mood and affect. Her behavior is normal. Judgment and thought content normal.    Results for orders placed or performed in visit on 08/20/16  Bayer DCA Hb A1c Waived  Result Value Ref Range   Bayer DCA Hb A1c Waived 7.7 (H) <7.0 %  Lipid Panel w/o Chol/HDL Ratio  Result Value Ref Range   Cholesterol, Total 179 100 - 199 mg/dL   Triglycerides 131 0 - 149 mg/dL   HDL 42 >39 mg/dL   VLDL Cholesterol Cal 26 5 - 40 mg/dL   LDL Calculated 111 (H) 0 - 99 mg/dL  Comprehensive metabolic panel  Result Value Ref Range   Glucose 138 (H) 65 - 99 mg/dL   BUN 19 8 - 27 mg/dL   Creatinine, Ser 1.04 (H) 0.57 - 1.00 mg/dL   GFR  calc non Af Amer 53 (L) >59 mL/min/1.73   GFR calc Af Amer 61 >59 mL/min/1.73   BUN/Creatinine Ratio 18 12 - 28   Sodium 137 134 - 144 mmol/L   Potassium 3.8 3.5 - 5.2 mmol/L   Chloride 96 96 - 106 mmol/L   CO2 22 18 - 29 mmol/L   Calcium 9.6 8.7 - 10.3 mg/dL   Total Protein 6.3 6.0 - 8.5 g/dL   Albumin 4.0 3.5 - 4.8 g/dL   Globulin, Total 2.3 1.5 - 4.5 g/dL   Albumin/Globulin Ratio 1.7 1.2 - 2.2   Bilirubin Total 0.2 0.0 - 1.2 mg/dL   Alkaline Phosphatase 67 39 - 117 IU/L   AST 13 0 - 40 IU/L   ALT 10 0 - 32 IU/L      Assessment & Plan:   Problem List Items Addressed This Visit      Unprioritized   Hypertensive CKD (chronic kidney disease)    Stable, continue present medications.        Uncontrolled type 2 diabetes  mellitus with chronic kidney disease ([place]) - Primary    Hgb A1C is 7.1.  Will continue with lifestyle changes      Relevant Orders   Comprehensive metabolic panel   Bayer DCA Hb A1c Waived   Urge incontinence    Start Oxybutynin.  Encourage her to use it prn rather than daily due to side effects.        Relevant Medications   oxybutynin (DITROPAN) 5 MG tablet       Follow up plan: Return in about 3 months (around 02/24/2017).

## 2016-11-25 ENCOUNTER — Encounter: Payer: Self-pay | Admitting: Unknown Physician Specialty

## 2016-11-25 LAB — COMPREHENSIVE METABOLIC PANEL
ALK PHOS: 60 IU/L (ref 39–117)
ALT: 13 IU/L (ref 0–32)
AST: 17 IU/L (ref 0–40)
Albumin/Globulin Ratio: 2 (ref 1.2–2.2)
Albumin: 4.5 g/dL (ref 3.5–4.8)
BILIRUBIN TOTAL: 0.2 mg/dL (ref 0.0–1.2)
BUN / CREAT RATIO: 16 (ref 12–28)
BUN: 18 mg/dL (ref 8–27)
CHLORIDE: 103 mmol/L (ref 96–106)
CO2: 24 mmol/L (ref 18–29)
Calcium: 9.5 mg/dL (ref 8.7–10.3)
Creatinine, Ser: 1.15 mg/dL — ABNORMAL HIGH (ref 0.57–1.00)
GFR calc Af Amer: 54 mL/min/{1.73_m2} — ABNORMAL LOW (ref >59)
GFR calc non Af Amer: 47 mL/min/{1.73_m2} — ABNORMAL LOW (ref >59)
GLUCOSE: 86 mg/dL (ref 65–99)
Globulin, Total: 2.3 g/dL (ref 1.5–4.5)
Potassium: 4.1 mmol/L (ref 3.5–5.2)
Sodium: 142 mmol/L (ref 134–144)
Total Protein: 6.8 g/dL (ref 6.0–8.5)

## 2016-12-02 ENCOUNTER — Other Ambulatory Visit: Payer: Self-pay | Admitting: Unknown Physician Specialty

## 2016-12-26 ENCOUNTER — Ambulatory Visit (INDEPENDENT_AMBULATORY_CARE_PROVIDER_SITE_OTHER): Payer: Medicare Other

## 2016-12-26 DIAGNOSIS — E538 Deficiency of other specified B group vitamins: Secondary | ICD-10-CM | POA: Diagnosis not present

## 2017-01-02 ENCOUNTER — Other Ambulatory Visit: Payer: Self-pay | Admitting: Unknown Physician Specialty

## 2017-01-07 ENCOUNTER — Telehealth: Payer: Self-pay | Admitting: Unknown Physician Specialty

## 2017-01-07 DIAGNOSIS — Z111 Encounter for screening for respiratory tuberculosis: Secondary | ICD-10-CM

## 2017-01-07 NOTE — Telephone Encounter (Signed)
Tuberculosis (TB) or Tetanus (TD)?

## 2017-01-07 NOTE — Telephone Encounter (Signed)
Patient would like to know if she had the TB vaccine or not. Attempted to find, but could not see on my end. Patient would like to make an appointment if she is due for test. Patient would like to know if she would just need an injection or an appointment with the provider.   Let me know if I need to call patient back to reschedule.   Please Advise.  Thank you

## 2017-01-07 NOTE — Telephone Encounter (Signed)
Attempted to reach pt to let her know that we do not carry the skin test for TB, but that we could do it via a blood draw. Pt did not answer. VM was left for pt to return call to office.

## 2017-01-07 NOTE — Telephone Encounter (Signed)
Patient mentioned that it is the test that goes under the skin. 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

## 2017-01-08 ENCOUNTER — Other Ambulatory Visit: Payer: Medicare Other

## 2017-01-08 DIAGNOSIS — Z111 Encounter for screening for respiratory tuberculosis: Secondary | ICD-10-CM

## 2017-01-08 NOTE — Addendum Note (Signed)
Addended by: Gerda Diss A on: 01/08/2017 09:30 AM   Modules accepted: Orders

## 2017-01-08 NOTE — Addendum Note (Signed)
Addended by: Valerie Roys on: 01/08/2017 09:34 AM   Modules accepted: Orders

## 2017-01-08 NOTE — Telephone Encounter (Signed)
Pt would like to come this afternoon for TB screening. Test T'd up if appropriate.

## 2017-01-08 NOTE — Telephone Encounter (Signed)
Sounds good. Orders signed

## 2017-01-08 NOTE — Telephone Encounter (Signed)
Spoke with patient and informed her message from [place]. Patient would like to get TB blood draw. Patient put on lab schedule for this afternoon 01/08/2017.

## 2017-01-13 ENCOUNTER — Encounter: Payer: Self-pay | Admitting: Family Medicine

## 2017-01-13 LAB — QUANTIFERON TB GOLD ASSAY (BLOOD)

## 2017-01-13 LAB — QUANTIFERON IN TUBE
QFT TB AG MINUS NIL VALUE: <0 IU/mL
QUANTIFERON MITOGEN VALUE: 7.1 IU/mL
QUANTIFERON TB AG VALUE: 0.05 IU/mL
QUANTIFERON TB GOLD: NEGATIVE
Quantiferon Nil Value: 0.07 IU/mL

## 2017-01-28 ENCOUNTER — Ambulatory Visit: Payer: Medicare Other

## 2017-01-29 ENCOUNTER — Ambulatory Visit: Payer: Medicare Other

## 2017-01-29 ENCOUNTER — Other Ambulatory Visit: Payer: Self-pay | Admitting: Unknown Physician Specialty

## 2017-02-05 ENCOUNTER — Emergency Department
Admission: EM | Admit: 2017-02-05 | Discharge: 2017-02-05 | Disposition: A | Discharge status: Home / Self Care | Payer: Medicare Other | Attending: Student in an Organized Health Care Education/Training Program | Admitting: Student in an Organized Health Care Education/Training Program

## 2017-02-05 ENCOUNTER — Emergency Department: Payer: Medicare Other

## 2017-02-05 ENCOUNTER — Ambulatory Visit (INDEPENDENT_AMBULATORY_CARE_PROVIDER_SITE_OTHER): Payer: Medicare Other

## 2017-02-05 ENCOUNTER — Encounter: Payer: Self-pay | Admitting: Emergency Medicine

## 2017-02-05 VITALS — BP 147/76 | HR 78 | Temp 98.2°F | Resp 16 | Ht 61.0 in | Wt 180.7 lb

## 2017-02-05 DIAGNOSIS — R0789 Other chest pain: Secondary | ICD-10-CM | POA: Diagnosis not present

## 2017-02-05 DIAGNOSIS — R05 Cough: Secondary | ICD-10-CM | POA: Diagnosis not present

## 2017-02-05 DIAGNOSIS — Z7984 Long term (current) use of oral hypoglycemic drugs: Secondary | ICD-10-CM | POA: Insufficient documentation

## 2017-02-05 DIAGNOSIS — Z87891 Personal history of nicotine dependence: Secondary | ICD-10-CM | POA: Diagnosis not present

## 2017-02-05 DIAGNOSIS — E1122 Type 2 diabetes mellitus with diabetic chronic kidney disease: Secondary | ICD-10-CM | POA: Insufficient documentation

## 2017-02-05 DIAGNOSIS — E538 Deficiency of other specified B group vitamins: Secondary | ICD-10-CM | POA: Diagnosis not present

## 2017-02-05 DIAGNOSIS — R079 Chest pain, unspecified: Secondary | ICD-10-CM | POA: Diagnosis not present

## 2017-02-05 DIAGNOSIS — M79602 Pain in left arm: Secondary | ICD-10-CM | POA: Diagnosis not present

## 2017-02-05 DIAGNOSIS — R0602 Shortness of breath: Secondary | ICD-10-CM | POA: Diagnosis not present

## 2017-02-05 DIAGNOSIS — Z0000 Encounter for general adult medical examination without abnormal findings: Secondary | ICD-10-CM

## 2017-02-05 DIAGNOSIS — I129 Hypertensive chronic kidney disease with stage 1 through stage 4 chronic kidney disease, or unspecified chronic kidney disease: Secondary | ICD-10-CM | POA: Diagnosis not present

## 2017-02-05 DIAGNOSIS — Z91040 Latex allergy status: Secondary | ICD-10-CM | POA: Insufficient documentation

## 2017-02-05 DIAGNOSIS — Z79899 Other long term (current) drug therapy: Secondary | ICD-10-CM | POA: Insufficient documentation

## 2017-02-05 DIAGNOSIS — I10 Essential (primary) hypertension: Secondary | ICD-10-CM | POA: Diagnosis not present

## 2017-02-05 DIAGNOSIS — R200 Anesthesia of skin: Secondary | ICD-10-CM | POA: Diagnosis not present

## 2017-02-05 DIAGNOSIS — N189 Chronic kidney disease, unspecified: Secondary | ICD-10-CM | POA: Insufficient documentation

## 2017-02-05 DIAGNOSIS — I209 Angina pectoris, unspecified: Secondary | ICD-10-CM | POA: Diagnosis not present

## 2017-02-05 DIAGNOSIS — E1140 Type 2 diabetes mellitus with diabetic neuropathy, unspecified: Secondary | ICD-10-CM | POA: Insufficient documentation

## 2017-02-05 DIAGNOSIS — R059 Cough, unspecified: Secondary | ICD-10-CM

## 2017-02-05 LAB — BASIC METABOLIC PANEL
ANION GAP: 8 (ref 5–15)
BUN: 16 mg/dL (ref 6–20)
CALCIUM: 9.5 mg/dL (ref 8.9–10.3)
CO2: 28 mmol/L (ref 22–32)
CREATININE: 0.94 mg/dL (ref 0.44–1.00)
Chloride: 102 mmol/L (ref 101–111)
GFR calc non Af Amer: 58 mL/min — ABNORMAL LOW (ref >60)
Glucose, Bld: 132 mg/dL — ABNORMAL HIGH (ref 65–99)
POTASSIUM: 3.5 mmol/L (ref 3.5–5.1)
Sodium: 138 mmol/L (ref 135–145)

## 2017-02-05 LAB — URINALYSIS, COMPLETE (UACMP) WITH MICROSCOPIC
Bilirubin Urine: NEGATIVE
Glucose, UA: NEGATIVE mg/dL
Hgb urine dipstick: NEGATIVE
Ketones, ur: NEGATIVE mg/dL
Leukocytes, UA: NEGATIVE
Nitrite: NEGATIVE
PH: 5 (ref 5.0–8.0)
Protein, ur: NEGATIVE mg/dL
SPECIFIC GRAVITY, URINE: 1.011 (ref 1.005–1.030)

## 2017-02-05 LAB — CBC
HCT: 33.8 % — ABNORMAL LOW (ref 35.0–47.0)
Hemoglobin: 11 g/dL — ABNORMAL LOW (ref 12.0–16.0)
MCH: 23.6 pg — AB (ref 26.0–34.0)
MCHC: 32.5 g/dL (ref 32.0–36.0)
MCV: 72.5 fL — AB (ref 80.0–100.0)
PLATELETS: 206 10*3/uL (ref 150–440)
RBC: 4.66 MIL/uL (ref 3.80–5.20)
RDW: 16.2 % — AB (ref 11.5–14.5)
WBC: 6 10*3/uL (ref 3.6–11.0)

## 2017-02-05 LAB — URINE DRUG SCREEN, QUALITATIVE (ARMC ONLY)
Amphetamines, Ur Screen: NOT DETECTED
BARBITURATES, UR SCREEN: NOT DETECTED
BENZODIAZEPINE, UR SCRN: NOT DETECTED
CANNABINOID 50 NG, UR ~~LOC~~: NOT DETECTED
COCAINE METABOLITE, UR ~~LOC~~: NOT DETECTED
MDMA (Ecstasy)Ur Screen: NOT DETECTED
Methadone Scn, Ur: NOT DETECTED
OPIATE, UR SCREEN: NOT DETECTED
Phencyclidine (PCP) Ur S: NOT DETECTED
TRICYCLIC, UR SCREEN: NOT DETECTED

## 2017-02-05 LAB — TROPONIN I

## 2017-02-05 LAB — GLUCOSE, CAPILLARY: Glucose-Capillary: 143 mg/dL — ABNORMAL HIGH (ref 65–99)

## 2017-02-05 MED ORDER — CYANOCOBALAMIN 1000 MCG/ML IJ SOLN
1000.0000 ug | Freq: Once | INTRAMUSCULAR | Status: AC
  Administered 2017-02-05: 1000 ug via INTRAMUSCULAR

## 2017-02-05 MED ORDER — HYDROCORTISONE NA SUCCINATE PF 250 MG IJ SOLR: 200.0000 mg | Freq: Once | INTRAMUSCULAR | Status: DC

## 2017-02-05 MED ORDER — HYDROCORTISONE NA SUCCINATE PF 100 MG IJ SOLR
200.0000 mg | Freq: Once | INTRAMUSCULAR | Status: AC
  Administered 2017-02-05: 200 mg via INTRAVENOUS

## 2017-02-05 MED ORDER — DIPHENHYDRAMINE HCL 50 MG/ML IJ SOLN: 50.0000 mg | Freq: Once | INTRAMUSCULAR | Status: AC

## 2017-02-05 MED ORDER — HYDROCORTISONE NA SUCCINATE PF 100 MG IJ SOLR
INTRAMUSCULAR | Status: AC
  Filled 2017-02-05: qty 4

## 2017-02-05 MED ORDER — IOPAMIDOL (ISOVUE-370) INJECTION 76%
125.0000 mL | Freq: Once | INTRAVENOUS | Status: AC | PRN
  Administered 2017-02-05: 125 mL via INTRAVENOUS

## 2017-02-05 MED ORDER — DIPHENHYDRAMINE HCL 25 MG PO CAPS
50.0000 mg | ORAL_CAPSULE | Freq: Once | ORAL | Status: AC
  Administered 2017-02-05: 50 mg via ORAL
  Filled 2017-02-05: qty 2

## 2017-02-05 MED ORDER — IPRATROPIUM-ALBUTEROL 0.5-2.5 (3) MG/3ML IN SOLN
3.0000 mL | Freq: Once | RESPIRATORY_TRACT | Status: AC
  Administered 2017-02-05: 3 mL via RESPIRATORY_TRACT
  Filled 2017-02-05: qty 3

## 2017-02-05 NOTE — Discharge Instructions (Signed)
Please seek medical attention for any high fevers, chest pain, shortness of breath, change in behavior, persistent vomiting, bloody stool or any other new or concerning symptoms.  

## 2017-02-05 NOTE — Patient Instructions (Addendum)
Tina Mcdonald , Thank you for taking time to come for your Medicare Wellness Visit. I appreciate your ongoing commitment to your health goals. Please review the following plan we discussed and let me know if I can assist you in the future.   Screening recommendations/referrals: Colonoscopy: completed 12/10/2015 Mammogram: completed 02/21/2016 Bone Density: completed 02/20/2016 Recommended yearly ophthalmology/optometry visit for glaucoma screening and checkup Recommended yearly dental visit for hygiene and checkup  Vaccinations: Influenza vaccine: due 02/2017  Pneumococcal vaccine: up to date Tdap vaccine: due, check with your insurance company for coverage Shingles vaccine:  due, check with your insurance company for coverage  Advanced directives: Please bring a copy of your health care power of attorney and living will to the office at your convenience.  Conditions/risks identified: Recommend drinking at least 6-7 glasses of water a day   Next appointment: Follow up on 02/24/2017 at 10:30am with [name].  Follow up in one year for your annual wellness exam.   Preventive Care 65 Years and Older, Female Preventive care refers to lifestyle choices and visits with your health care provider that can promote health and wellness. What does preventive care include?  A yearly physical exam. This is also called an annual well check.  Dental exams once or twice a year.  Routine eye exams. Ask your health care provider how often you should have your eyes checked.  Personal lifestyle choices, including:  Daily care of your teeth and gums.  Regular physical activity.  Eating a healthy diet.  Avoiding tobacco and drug use.  Limiting alcohol use.  Practicing safe sex.  Taking low-dose aspirin every day.  Taking vitamin and mineral supplements as recommended by your health care provider. What happens during an annual well check? The services and screenings done by your health  care provider during your annual well check will depend on your age, overall health, lifestyle risk factors, and family history of disease. Counseling  Your health care provider may ask you questions about your:  Alcohol use.  Tobacco use.  Drug use.  Emotional well-being.  Home and relationship well-being.  Sexual activity.  Eating habits.  History of falls.  Memory and ability to understand (cognition).  Work and work Statistician.  Reproductive health. Screening  You may have the following tests or measurements:  Height, weight, and BMI.  Blood pressure.  Lipid and cholesterol levels. These may be checked every 5 years, or more frequently if you are over 25 years old.  Skin check.  Lung cancer screening. You may have this screening every year starting at age 90 if you have a 30-pack-year history of smoking and currently smoke or have quit within the past 15 years.  Fecal occult blood test (FOBT) of the stool. You may have this test every year starting at age 72.  Flexible sigmoidoscopy or colonoscopy. You may have a sigmoidoscopy every 5 years or a colonoscopy every 10 years starting at age 33.  Hepatitis C blood test.  Hepatitis B blood test.  Sexually transmitted disease (STD) testing.  Diabetes screening. This is done by checking your blood sugar (glucose) after you have not eaten for a while (fasting). You may have this done every 1-3 years.  Bone density scan. This is done to screen for osteoporosis. You may have this done starting at age 61.  Mammogram. This may be done every 1-2 years. Talk to your health care provider about how often you should have regular mammograms. Talk with your health care provider about  your test results, treatment options, and if necessary, the need for more tests. Vaccines  Your health care provider may recommend certain vaccines, such as:  Influenza vaccine. This is recommended every year.  Tetanus, diphtheria, and  acellular pertussis (Tdap, Td) vaccine. You may need a Td booster every 10 years.  Zoster vaccine. You may need this after age 40.  Pneumococcal 13-valent conjugate (PCV13) vaccine. One dose is recommended after age 27.  Pneumococcal polysaccharide (PPSV23) vaccine. One dose is recommended after age 74. Talk to your health care provider about which screenings and vaccines you need and how often you need them. This information is not intended to replace advice given to you by your health care provider. Make sure you discuss any questions you have with your health care provider. Document Released: 07/06/2015 Document Revised: 02/27/2016 Document Reviewed: 04/10/2015 Elsevier Interactive Patient Education  2017 [place] Prevention in the Home Falls can cause injuries. They can happen to people of all ages. There are many things you can do to make your home safe and to help prevent falls. What can I do on the outside of my home?  Regularly fix the edges of walkways and driveways and fix any cracks.  Remove anything that might make you trip as you walk through a door, such as a raised step or threshold.  Trim any bushes or trees on the path to your home.  Use bright outdoor lighting.  Clear any walking paths of anything that might make someone trip, such as rocks or tools.  Regularly check to see if handrails are loose or broken. Make sure that both sides of any steps have handrails.  Any raised decks and porches should have guardrails on the edges.  Have any leaves, snow, or ice cleared regularly.  Use sand or salt on walking paths during winter.  Clean up any spills in your garage right away. This includes oil or grease spills. What can I do in the bathroom?  Use night lights.  Install grab bars by the toilet and in the tub and shower. Do not use towel bars as grab bars.  Use non-skid mats or decals in the tub or shower.  If you need to sit down in the shower, use  a plastic, non-slip stool.  Keep the floor dry. Clean up any water that spills on the floor as soon as it happens.  Remove soap buildup in the tub or shower regularly.  Attach bath mats securely with double-sided non-slip rug tape.  Do not have throw rugs and other things on the floor that can make you trip. What can I do in the bedroom?  Use night lights.  Make sure that you have a light by your bed that is easy to reach.  Do not use any sheets or blankets that are too big for your bed. They should not hang down onto the floor.  Have a firm chair that has side arms. You can use this for support while you get dressed.  Do not have throw rugs and other things on the floor that can make you trip. What can I do in the kitchen?  Clean up any spills right away.  Avoid walking on wet floors.  Keep items that you use a lot in easy-to-reach places.  If you need to reach something above you, use a strong step stool that has a grab bar.  Keep electrical cords out of the way.  Do not use floor polish or wax  that makes floors slippery. If you must use wax, use non-skid floor wax.  Do not have throw rugs and other things on the floor that can make you trip. What can I do with my stairs?  Do not leave any items on the stairs.  Make sure that there are handrails on both sides of the stairs and use them. Fix handrails that are broken or loose. Make sure that handrails are as long as the stairways.  Check any carpeting to make sure that it is firmly attached to the stairs. Fix any carpet that is loose or worn.  Avoid having throw rugs at the top or bottom of the stairs. If you do have throw rugs, attach them to the floor with carpet tape.  Make sure that you have a light switch at the top of the stairs and the bottom of the stairs. If you do not have them, ask someone to add them for you. What else can I do to help prevent falls?  Wear shoes that:  Do not have high heels.  Have  rubber bottoms.  Are comfortable and fit you well.  Are closed at the toe. Do not wear sandals.  If you use a stepladder:  Make sure that it is fully opened. Do not climb a closed stepladder.  Make sure that both sides of the stepladder are locked into place.  Ask someone to hold it for you, if possible.  Clearly mark and make sure that you can see:  Any grab bars or handrails.  First and last steps.  Where the edge of each step is.  Use tools that help you move around (mobility aids) if they are needed. These include:  Canes.  Walkers.  Scooters.  Crutches.  Turn on the lights when you go into a dark area. Replace any light bulbs as soon as they burn out.  Set up your furniture so you have a clear path. Avoid moving your furniture around.  If any of your floors are uneven, fix them.  If there are any pets around you, be aware of where they are.  Review your medicines with your doctor. Some medicines can make you feel dizzy. This can increase your chance of falling. Ask your doctor what other things that you can do to help prevent falls. This information is not intended to replace advice given to you by your health care provider. Make sure you discuss any questions you have with your health care provider. Document Released: 04/05/2009 Document Revised: 11/15/2015 Document Reviewed: 07/14/2014 Elsevier Interactive Patient Education  2017 [employer].

## 2017-02-05 NOTE — Progress Notes (Signed)
Subjective:   Tina Mcdonald is a 76 y.o. female who presents for Medicare Annual (Subsequent) preventive examination.  Review of Systems:  Cardiac Risk Factors include: advanced age (>49men, >65 women);hypertension;diabetes mellitus;obesity (BMI >30kg/m2)     Objective:     Vitals: BP (!) 147/76 (BP Location: Left Arm, Patient Position: Sitting)   Pulse 78   Temp 98.2 F (36.8 C)   Resp 16   Ht 5\' 1"  (1.549 m)   Wt 180 lb 11.2 oz (82 kg)   LMP  (LMP Unknown)   BMI 34.14 kg/m   Body mass index is 34.14 kg/m.   Tobacco History  Smoking Status  . Former Smoker  . Quit date: 11/22/1997  Smokeless Tobacco  . Never Used     Counseling given: Not Answered   Past Medical History:  Diagnosis Date  . Anemia   . Arthritis   . Chronic kidney disease   . Colon polyps   . Diabetes mellitus without complication ([place])   . Diabetic neuropathy ([place])   . GERD (gastroesophageal reflux disease)   . Hyperlipemia   . Hypertension   . Osteoarthritis   . Radiculopathy   . Sleep apnea    does not have her cpap anymore  . Stress incontinence   . Urticaria   . Vitamin B 12 deficiency   . Wears glasses    Past Surgical History:  Procedure Laterality Date  . ABDOMINAL HYSTERECTOMY    . APPENDECTOMY    . BREAST BIOPSY Right    right-neg  . BREAST BIOPSY Left    us/bx/clip-neg  . CARPAL TUNNEL RELEASE     right  . COLONOSCOPY    . COLONOSCOPY WITH PROPOFOL N/A 12/10/2015   Procedure: COLONOSCOPY WITH PROPOFOL;  Surgeon: Lucilla Lame, MD;  Location: [facility];  Service: Endoscopy;  Laterality: N/A;  Diabetic - oral meds Sleep Apnea LATEX allergy  . CYSTOSCOPY    . DILATION AND CURETTAGE OF UTERUS    . EYE SURGERY Bilateral    lens implant  . LUMBAR FUSION  2009  . POLYPECTOMY  12/10/2015   Procedure: POLYPECTOMY;  Surgeon: Lucilla Lame, MD;  Location: [facility];  Service: Endoscopy;;  . RESECTION DISTAL CLAVICAL Right 11/25/2013   Procedure: RIGHT  SHOULDER OPEN RESECTION DISTAL CLAVICAL EXCISION SOFT TISSUE TUMOR SHOULDER DEEP SUBFASCIAL INTRAMUSCULAR/DEBRIDEMENT/ROTATOR CUFF REPAIR/BICEPS TENODESIS/MANIPULATION;  Surgeon: Renette Butters, MD;  Location: [facility];  Service: Orthopedics;  Laterality: Right;  . SHOULDER ARTHROSCOPY WITH ROTATOR CUFF REPAIR AND SUBACROMIAL DECOMPRESSION Right 11/25/2013   Procedure: SHOULDER ARTHROSCOPY WITH DEBRIDEMENT ROTATOR CUFF REPAIR  A;  Surgeon: Renette Butters, MD;  Location: [facility];  Service: Orthopedics;  Laterality: Right;   Family History  Problem Relation Age of Onset  . Heart disease Mother   . Diabetes Mother   . Hypertension Mother   . Heart disease Father   . Hypertension Father   . Diabetes Sister   . Hypertension Sister   . Heart disease Sister   . Diabetes Sister   . Hypertension Sister   . Heart disease Son 28  . Breast cancer Cousin 50   History  Sexual Activity  . Sexual activity: No    Outpatient Encounter Prescriptions as of 02/05/2017  Medication Sig  . atorvastatin (LIPITOR) 20 MG tablet TAKE 1 TABLET BY MOUTH ONCE DAILY  . cloNIDine (CATAPRES) 0.1 MG tablet Take 0.1 mg by mouth 2 (two) times daily.  . cyanocobalamin (,VITAMIN  B-12,) 1000 MCG/ML injection Inject 1 mL (1,000 mcg total) into the muscle once.  [name] glucose blood test strip Check daily. ICD 10 E11.9  . hydrochlorothiazide (HYDRODIURIL) 25 MG tablet TAKE 1 TABLET BY MOUTH ONCE DAILY  . metFORMIN (GLUCOPHAGE-XR) 500 MG 24 hr tablet TAKE TWO TABLETS BY MOUTH TWICE DAILY  . metoprolol succinate (TOPROL-XL) 50 MG 24 hr tablet TAKE 1 TABLET BY MOUTH ONCE DAILY  . omeprazole (PRILOSEC) 20 MG capsule TAKE ONE CAPSULE BY MOUTH ONCE DAILY  . oxybutynin (DITROPAN) 5 MG tablet Take 1 tablet (5 mg total) by mouth 3 (three) times daily. (Patient not taking: Reported on 02/05/2017)   Facility-Administered Encounter Medications as of 02/05/2017  Medication  . cyanocobalamin ((VITAMIN  B-12)) injection 1,000 mcg  . cyanocobalamin ((VITAMIN B-12)) injection 1,000 mcg  . [COMPLETED] cyanocobalamin ((VITAMIN B-12)) injection 1,000 mcg    Activities of Daily Living In your present state of health, do you have any difficulty performing the following activities: 02/05/2017  Hearing? N  Vision? N  Difficulty concentrating or making decisions? N  Walking or climbing stairs? Y  Comment SOB   Dressing or bathing? N  Doing errands, shopping? N  Preparing Food and eating ? N  Using the Toilet? N  In the past six months, have you accidently leaked urine? Y  Comment pads  Do you have problems with loss of bowel control? N  Managing your Medications? N  Managing your Finances? N  Housekeeping or managing your Housekeeping? N  Some recent data might be hidden    Patient Care Team: Kathrine Haddock, NP as PCP - General (Nurse Practitioner) Yolonda Kida, MD (Internal Medicine)    Assessment:     Exercise Activities and Dietary recommendations Current Exercise Habits: The patient does not participate in regular exercise at present  Goals    . Increase water intake          Recommend drinking at least 6-7 glasses of water a day       Fall Risk Fall Risk  02/05/2017 11/24/2016 11/20/2015 10/03/2015  Falls in the past year? Yes No No No  Number falls in past yr: 1 - - -  Injury with Fall? No - - -  Follow up Falls prevention discussed - - -   Depression Screen PHQ 2/9 Scores 02/05/2017 11/24/2016 11/20/2015 10/03/2015  PHQ - 2 Score 0 0 0 0  PHQ- 9 Score - 4 - -     Cognitive Function     6CIT Screen 02/05/2017  What Year? 0 points  What month? 0 points  What time? 0 points  Count back from 20 0 points  Months in reverse 0 points  Repeat phrase 0 points  Total Score 0    Immunization History  Administered Date(s) Administered  . Pneumococcal Conjugate-13 10/12/2013  . Pneumococcal Polysaccharide-23 05/28/2010  . Td 10/02/2006   Screening Tests Health  Maintenance  Topic Date Due  . FOOT EXAM  02/20/2016  . INFLUENZA VACCINE  01/21/2017  . OPHTHALMOLOGY EXAM  03/23/2017 (Originally 09/03/2016)  . TETANUS/TDAP  05/26/2017 (Originally 10/01/2016)  . URINE MICROALBUMIN  02/14/2017  . HEMOGLOBIN A1C  05/26/2017  . MAMMOGRAM  02/19/2018  . COLONOSCOPY  12/09/2025  . DEXA SCAN  Completed  . PNA vac Low Risk Adult  Completed      Plan:     I have personally reviewed and addressed the Medicare Annual Wellness questionnaire and have noted the following in the patient's chart:  A.  Medical and social history B. Use of alcohol, tobacco or illicit drugs  C. Current medications and supplements D. Functional ability and status E.  Nutritional status F.  Physical activity G. Advance directives H. List of other physicians I.  Hospitalizations, surgeries, and ER visits in previous 12 months J.  [place] such as hearing and vision if needed, cognitive and depression L. Referrals and appointments  In addition, I have reviewed and discussed with patient certain preventive protocols, quality metrics, and best practice recommendations. A written personalized care plan for preventive services as well as general preventive health recommendations were provided to patient.   Signed,  Tyler Aas, LPN Nurse Health Advisor   MD Recommendations: Patient requested her B12 today- Verbal order received for patient vitamin b-12 injection today  Patient due for diabetic foot exam at next visit  Patient complains of pain radiating from shoulder blades and chest area down left arm with SOB, She states it started on Monday and was 10/10 pain. Today the pain has subsided to a 4/10 pain. Informed Dr.Crissman, due to her risk factors he would like her to see her cardiologist today if possible. Called Dr.Callwoods office, they are able to work patient in. Patient was informed to head straight over there.

## 2017-02-05 NOTE — ED Provider Notes (Signed)
-----------------------------------------   11:10 PM on 02/05/2017 -----------------------------------------  Patient's second troponin negative. Patient CT angiogram without concerning findings per patient states she is feeling okay with just a little bit of pain in her left shoulder. Patient will be discharged home. Discussed with patient reports of following up with her primary care physician.   Nance Pear, MD 02/05/17 [phone]

## 2017-02-05 NOTE — ED Provider Notes (Signed)
[facility] Emergency Department Provider Note    First MD Initiated Contact with Patient 02/05/17 1614     (approximate)  I have reviewed the triage vital signs and the nursing notes.   HISTORY  Chief Complaint Chest Pain    HPI Tina Mcdonald is a 76 y.o. female who presents withmidsternal chest pain radiating to his shoulder blades and down left arm shortness of breath that started on Monday. States the pain has been intermittent. The pain finally relaxed and a 4 out of 10 in severity. Patient does have steps cardiology and went to see her cardiologist the clinic but due to her having active pain she was sent to the ER for further evaluation. While in triage the patient was complaining of persistent pain and had a brief few second period where she was confused and could not recall why she came to the ER. I time she came back to the ER bed the patient denied any confusion states "I'm just fine" states that she was having chest pain and shoulder pain. No change with position. No fevers. No numbness or tingling. No reported abdominal pain but does endorse pain when she coughs.   Past Medical History:  Diagnosis Date  . Anemia   . Arthritis   . Chronic kidney disease   . Colon polyps   . Diabetes mellitus without complication ([place])   . Diabetic neuropathy ([place])   . GERD (gastroesophageal reflux disease)   . Hyperlipemia   . Hypertension   . Osteoarthritis   . Radiculopathy   . Sleep apnea    does not have her cpap anymore  . Stress incontinence   . Urticaria   . Vitamin B 12 deficiency   . Wears glasses    Family History  Problem Relation Age of Onset  . Heart disease Mother   . Diabetes Mother   . Hypertension Mother   . Heart disease Father   . Hypertension Father   . Diabetes Sister   . Hypertension Sister   . Heart disease Sister   . Diabetes Sister   . Hypertension Sister   . Heart disease Son 30  . Breast cancer Cousin 64   Past  Surgical History:  Procedure Laterality Date  . ABDOMINAL HYSTERECTOMY    . APPENDECTOMY    . BREAST BIOPSY Right    right-neg  . BREAST BIOPSY Left    us/bx/clip-neg  . CARPAL TUNNEL RELEASE     right  . COLONOSCOPY    . COLONOSCOPY WITH PROPOFOL N/A 12/10/2015   Procedure: COLONOSCOPY WITH PROPOFOL;  Surgeon: Lucilla Lame, MD;  Location: [place];  Service: Endoscopy;  Laterality: N/A;  Diabetic - oral meds Sleep Apnea LATEX allergy  . CYSTOSCOPY    . DILATION AND CURETTAGE OF UTERUS    . EYE SURGERY Bilateral    lens implant  . LUMBAR FUSION  2009  . POLYPECTOMY  12/10/2015   Procedure: POLYPECTOMY;  Surgeon: Lucilla Lame, MD;  Location: [place];  Service: Endoscopy;;  . RESECTION DISTAL CLAVICAL Right 11/25/2013   Procedure: RIGHT SHOULDER OPEN RESECTION DISTAL CLAVICAL EXCISION SOFT TISSUE TUMOR SHOULDER DEEP SUBFASCIAL INTRAMUSCULAR/DEBRIDEMENT/ROTATOR CUFF REPAIR/BICEPS TENODESIS/MANIPULATION;  Surgeon: Renette Butters, MD;  Location: [place];  Service: Orthopedics;  Laterality: Right;  . SHOULDER ARTHROSCOPY WITH ROTATOR CUFF REPAIR AND SUBACROMIAL DECOMPRESSION Right 11/25/2013   Procedure: SHOULDER ARTHROSCOPY WITH DEBRIDEMENT ROTATOR CUFF REPAIR  A;  Surgeon: Renette Butters, MD;  Location:  [place];  Service: Orthopedics;  Laterality: Right;   Patient Active Problem List   Diagnosis Date Noted  . Urge incontinence 11/24/2016  . Special screening for malignant neoplasms, colon   . Benign neoplasm of sigmoid colon   . Cough 10/17/2015  . CKD (chronic kidney disease) 02/20/2015  . Colon polyps 01/16/2015  . Anemia 01/16/2015  . Vitamin B12 deficiency 01/16/2015  . Radiculopathy 01/16/2015  . Stress incontinence 01/16/2015  . Urticaria 01/16/2015  . 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 Osteoarthritis of both knees 01/16/2015  . Hypertension 01/16/2015  .  Hyperlipidemia 01/16/2015  . Diabetic neuropathy ([place]) 01/16/2015  . Hypertensive CKD (chronic kidney disease) 01/16/2015  . Hypercholesterolemia 01/16/2015  . GERD (gastroesophageal reflux disease) 01/16/2015      Prior to Admission medications   Medication Sig Start Date End Date Taking? Authorizing Provider  atorvastatin (LIPITOR) 20 MG tablet TAKE 1 TABLET BY MOUTH ONCE DAILY 01/02/17   Kathrine Haddock, NP  cloNIDine (CATAPRES) 0.1 MG tablet Take 0.1 mg by mouth 2 (two) times daily.    [provider]  cyanocobalamin (,VITAMIN B-12,) 1000 MCG/ML injection Inject 1 mL (1,000 mcg total) into the muscle once. 03/21/15   Kathrine Haddock, NP  glucose blood test strip Check daily. ICD 10 E11.9 01/16/15   Kathrine Haddock, NP  hydrochlorothiazide (HYDRODIURIL) 25 MG tablet TAKE 1 TABLET BY MOUTH ONCE DAILY 01/30/17   Kathrine Haddock, NP  metFORMIN (GLUCOPHAGE-XR) 500 MG 24 hr tablet TAKE TWO TABLETS BY MOUTH TWICE DAILY 11/03/16   Kathrine Haddock, NP  metoprolol succinate (TOPROL-XL) 50 MG 24 hr tablet TAKE 1 TABLET BY MOUTH ONCE DAILY 01/02/17   Kathrine Haddock, NP  omeprazole (PRILOSEC) 20 MG capsule TAKE ONE CAPSULE BY MOUTH ONCE DAILY 08/06/16   Kathrine Haddock, NP  oxybutynin (DITROPAN) 5 MG tablet Take 1 tablet (5 mg total) by mouth 3 (three) times daily. Patient not taking: Reported on 02/05/2017 11/24/16   Kathrine Haddock, NP    Allergies Contrast media [iodinated diagnostic agents]; Neosporin [neomycin-bacitracin zn-polymyx]; Penicillins; Sulfa antibiotics; and Latex    Social History Social History  Substance Use Topics  . Smoking status: Former Smoker    Quit date: 11/22/1997  . Smokeless tobacco: Never Used  . Alcohol use No    Review of Systems Patient denies headaches, rhinorrhea, blurry vision, numbness, shortness of breath, chest pain, edema, cough, abdominal pain, nausea, vomiting, diarrhea, dysuria, fevers, rashes or hallucinations unless otherwise stated above in  HPI. ____________________________________________   PHYSICAL EXAM:  VITAL SIGNS: Vitals:   02/05/17 1559  BP: (!) 126/99  Pulse: 81  Resp: 18  Temp: 98.7 F (37.1 C)  SpO2: 99%    Constitutional: Alert and oriented. Well appearing and in no acute distress. Eyes: Conjunctivae are normal.  Head: Atraumatic. Nose: No congestion/rhinnorhea. Mouth/Throat: Mucous membranes are moist.   Neck: No stridor. Painless ROM. No bruit Cardiovascular: Normal rate, regular rhythm. Grossly normal heart sounds.  Good peripheral circulation. Respiratory: Normal respiratory effort.  No retractions. Lungs CTAB. Gastrointestinal: Soft with mild ttp in epigastric region.  No distention. No abdominal bruits. No CVA tenderness. Genitourinary:  Musculoskeletal: No lower extremity tenderness nor edema.  No joint effusions. Neurologic:  Normal speech and language. No gross focal neurologic deficits are appreciated. No facial droop Skin:  Skin is warm, dry and intact. No rash noted. Psychiatric: Mood and affect are normal. Speech and behavior are normal.  ____________________________________________   LABS (all  labs ordered are listed, but only abnormal results are displayed)  Results for orders placed or performed during the hospital encounter of 02/05/17 (from the past 24 hour(s))  Glucose, capillary     Status: Abnormal   Collection Time: 02/05/17  4:09 PM  Result Value Ref Range   Glucose-Capillary 143 (H) 65 - 99 mg/dL   ____________________________________________  EKG My review and personal interpretation at Time: 15:58   Indication: chest pain  Rate: 85  Rhythm: sinus Axis: normal Other:  Inferior t wave inversions with no evidence of stemi, no significant depressions noted. ____________________________________________  RADIOLOGY  I personally reviewed all radiographic images ordered to evaluate for the above acute complaints and reviewed radiology reports and findings.  These findings  were personally discussed with the patient.  Please see medical record for radiology report.  ____________________________________________   PROCEDURES  Procedure(s) performed:  Procedures    Critical Care performed: no ____________________________________________   INITIAL IMPRESSION / ASSESSMENT AND PLAN / ED COURSE  Pertinent labs & imaging results that were available during my care of the patient were reviewed by me and considered in my medical decision making (see chart for details).  DDX: ACS, pericarditis, esophagitis, boerhaaves, pe, dissection, pna, bronchitis, costochondritis   ROSA WYLY is a 76 y.o. who presents to the ED with chest pain cough and shortness of breath as described above. Patient brief confusion which is so brief in nature and not sure even meets criteria for TIA. Possible secondary to stress or shortness or pain. No focal neurologic deficits at this time. Patient denied any worsening chest pain at that time but basilar presentation will further workup for evidence of ACS. Given her pain radiating through to her back will order CT angiogram to evaluate for evidence of dissection.  The patient will be placed on continuous pulse oximetry and telemetry for monitoring.  Laboratory evaluation will be sent to evaluate for the above complaints.     Clinical Course as of Feb 06 1743  Thu Feb 05, 2017  1742 Patient reassessed and states that she's feeling better and is requesting discharge home. For that we do not have any laboratory results back yet and that based on her symptoms I am still concerned for the above differential and recommended CT imaging. Patient agrees to stay for further workup.  [PR]    Clinical Course User Index [PR] Merlyn Lot, MD    Patient remains hemodynamic stable. Still having intermittent cough which seems to have improved with nebulizer treatment. Awaiting repeat troponin and CT angiogram. Patient will be signed out to Dr.  Archie Balboa pending further testing. Do anticipate discharge home if CT and laboratory evaluation reassuring. ____________________________________________   FINAL CLINICAL IMPRESSION(S) / ED DIAGNOSES  Final diagnoses:  Chest pain, unspecified type  Cough      NEW MEDICATIONS STARTED DURING THIS VISIT:  New Prescriptions   No medications on file     Note:  This document was prepared using Dragon voice recognition software and may include unintentional dictation errors.    Merlyn Lot, MD 02/05/17 2031

## 2017-02-05 NOTE — ED Notes (Signed)
Dr. Goodman at bedside.  

## 2017-02-05 NOTE — ED Notes (Signed)
Signature pad not working, pt verbalizes understanding of discharge and follow up instructions 

## 2017-02-05 NOTE — ED Triage Notes (Signed)
Pt brought over from [address] [facility] for reports of central chest pain radiating to her back. Pt reports associated SOB, nausea, and dizziness. Pt appears uncomfortable in triage.

## 2017-02-10 ENCOUNTER — Other Ambulatory Visit: Payer: Self-pay | Admitting: Unknown Physician Specialty

## 2017-02-24 ENCOUNTER — Encounter: Payer: Self-pay | Admitting: Unknown Physician Specialty

## 2017-02-24 ENCOUNTER — Ambulatory Visit (INDEPENDENT_AMBULATORY_CARE_PROVIDER_SITE_OTHER): Payer: Medicare Other | Admitting: Unknown Physician Specialty

## 2017-02-24 VITALS — BP 117/74 | HR 75 | Temp 98.3°F | Wt 175.8 lb

## 2017-02-24 DIAGNOSIS — E1122 Type 2 diabetes mellitus with diabetic chronic kidney disease: Secondary | ICD-10-CM

## 2017-02-24 DIAGNOSIS — G4730 Sleep apnea, unspecified: Secondary | ICD-10-CM | POA: Insufficient documentation

## 2017-02-24 DIAGNOSIS — R05 Cough: Secondary | ICD-10-CM

## 2017-02-24 DIAGNOSIS — M7582 Other shoulder lesions, left shoulder: Secondary | ICD-10-CM | POA: Diagnosis not present

## 2017-02-24 DIAGNOSIS — G4733 Obstructive sleep apnea (adult) (pediatric): Secondary | ICD-10-CM | POA: Insufficient documentation

## 2017-02-24 DIAGNOSIS — I129 Hypertensive chronic kidney disease with stage 1 through stage 4 chronic kidney disease, or unspecified chronic kidney disease: Secondary | ICD-10-CM

## 2017-02-24 DIAGNOSIS — K219 Gastro-esophageal reflux disease without esophagitis: Secondary | ICD-10-CM

## 2017-02-24 DIAGNOSIS — E1165 Type 2 diabetes mellitus with hyperglycemia: Secondary | ICD-10-CM | POA: Diagnosis not present

## 2017-02-24 DIAGNOSIS — E782 Mixed hyperlipidemia: Secondary | ICD-10-CM

## 2017-02-24 DIAGNOSIS — M791 Myalgia, unspecified site: Secondary | ICD-10-CM | POA: Insufficient documentation

## 2017-02-24 DIAGNOSIS — N183 Chronic kidney disease, stage 3 (moderate): Secondary | ICD-10-CM | POA: Diagnosis not present

## 2017-02-24 DIAGNOSIS — R059 Cough, unspecified: Secondary | ICD-10-CM

## 2017-02-24 DIAGNOSIS — IMO0002 Reserved for concepts with insufficient information to code with codable children: Secondary | ICD-10-CM

## 2017-02-24 NOTE — Progress Notes (Signed)
BP 117/74 (BP Location: Left Arm, Cuff Size: Normal)   Pulse 75   Temp 98.3 F (36.8 C)   Wt 175 lb 12.8 oz (79.7 kg)   LMP  (LMP Unknown)   SpO2 98%   BMI 33.22 kg/m    Subjective:    Patient ID: Tina Mcdonald, female    DOB: [birth year], 76 y.o.   MRN: [id number]  HPI: Tina Mcdonald is a 76 y.o. female  Chief Complaint  Patient presents with  . Diabetes  . Hyperlipidemia  . Hypertension   Diabetes: Using medications without difficulties No hypoglycemic episodes No hyperglycemic episodes Feet problems: none Blood Sugars averaging: eye exam within last year Last Hgb A1C: 7.1  Hypertension  Using medications without difficulty Average home BPs   Using medication without problems or lightheadedness No chest pain with exertion or shortness of breath No Edema  Elevated Cholesterol Using medications without problems No Muscle aches  Diet:  Exercise:  Chest pain Went to the ER for an episode of chest pain.  MI was ruled out.  She sees Dr Clayborn Bigness on a regular basis  Cough States she is coughing, spitting up mucous, and is tired.  This has been going on for about 1 month.  States her body hurts.  Last chest x-ray was 02/05/17 and was good.  States this happens when swallowing and in the AM  Left shoulder Aches and goes back to shoulder blade.    Fatigue States she sleeps all the time.  Muscles ache.  Snores.  Wakes up gasping for air.    Social History   Social History  . Marital status: Single    Spouse name: N/A  . Number of children: N/A  . Years of education: N/A   Occupational History  . Not on file.   Social History Main Topics  . Smoking status: Former Smoker    Quit date: 11/22/1997  . Smokeless tobacco: Never Used  . Alcohol use No  . Drug use: No  . Sexual activity: No   Other Topics Concern  . Not on file   Social History Narrative  . No narrative on file       Relevant past medical, surgical, family and social history  reviewed and updated as indicated. Interim medical history since our last visit reviewed. Allergies and medications reviewed and updated.  Review of Systems  Per HPI unless specifically indicated above     Objective:    BP 117/74 (BP Location: Left Arm, Cuff Size: Normal)   Pulse 75   Temp 98.3 F (36.8 C)   Wt 175 lb 12.8 oz (79.7 kg)   LMP  (LMP Unknown)   SpO2 98%   BMI 33.22 kg/m   Wt Readings from Last 3 Encounters:  02/24/17 175 lb 12.8 oz (79.7 kg)  02/05/17 180 lb (81.6 kg)  02/05/17 180 lb 11.2 oz (82 kg)    Physical Exam  Constitutional: She is oriented to person, place, and time. She appears well-developed and well-nourished. No distress.  HENT:  Head: Normocephalic and atraumatic.  Eyes: Conjunctivae and lids are normal. Right eye exhibits no discharge. Left eye exhibits no discharge. No scleral icterus.  Neck: Normal range of motion. Neck supple. No JVD present. Carotid bruit is not present.  Cardiovascular: Normal rate, regular rhythm and normal heart sounds.   Pulmonary/Chest: Effort normal and breath sounds normal.  Abdominal: Normal appearance. There is no splenomegaly or hepatomegaly.  Musculoskeletal: Normal range of motion.  Right shoulder: She exhibits normal range of motion, no tenderness and no bony tenderness.       Left shoulder: She exhibits tenderness. She exhibits normal range of motion and no bony tenderness.  Positive impingement sign  Neurological: She is alert and oriented to person, place, and time.  Skin: Skin is warm, dry and intact. No rash noted. No pallor.  Psychiatric: She has a normal mood and affect. Her behavior is normal. Judgment and thought content normal.    Results for orders placed or performed during the hospital encounter of 29/56/21  Basic metabolic panel  Result Value Ref Range   Sodium 138 135 - 145 mmol/L   Potassium 3.5 3.5 - 5.1 mmol/L   Chloride 102 101 - 111 mmol/L   CO2 28 22 - 32 mmol/L   Glucose, Bld  132 (H) 65 - 99 mg/dL   BUN 16 6 - 20 mg/dL   Creatinine, Ser 0.94 0.44 - 1.00 mg/dL   Calcium 9.5 8.9 - 10.3 mg/dL   GFR calc non Af Amer 58 (L) >60 mL/min   GFR calc Af Amer >60 >60 mL/min   Anion gap 8 5 - 15  CBC  Result Value Ref Range   WBC 6.0 3.6 - 11.0 K/uL   RBC 4.66 3.80 - 5.20 MIL/uL   Hemoglobin 11.0 (L) 12.0 - 16.0 g/dL   HCT 33.8 (L) 35.0 - 47.0 %   MCV 72.5 (L) 80.0 - 100.0 fL   MCH 23.6 (L) 26.0 - 34.0 pg   MCHC 32.5 32.0 - 36.0 g/dL   RDW 16.2 (H) 11.5 - 14.5 %   Platelets 206 150 - 440 K/uL  Troponin I  Result Value Ref Range   Troponin I <0.03 <0.03 ng/mL  Glucose, capillary  Result Value Ref Range   Glucose-Capillary 143 (H) 65 - 99 mg/dL  Urine Drug Screen, Qualitative ([facility] only)  Result Value Ref Range   Tricyclic, Ur Screen NONE DETECTED NONE DETECTED   Amphetamines, Ur Screen NONE DETECTED NONE DETECTED   MDMA (Ecstasy)Ur Screen NONE DETECTED NONE DETECTED   Cocaine Metabolite,Ur [place] NONE DETECTED NONE DETECTED   Opiate, Ur Screen NONE DETECTED NONE DETECTED   Phencyclidine (PCP) Ur S NONE DETECTED NONE DETECTED   Cannabinoid 50 Ng, Ur [place] NONE DETECTED NONE DETECTED   Barbiturates, Ur Screen NONE DETECTED NONE DETECTED   Benzodiazepine, Ur Scrn NONE DETECTED NONE DETECTED   Methadone Scn, Ur NONE DETECTED NONE DETECTED  Urinalysis, Complete w Microscopic  Result Value Ref Range   Color, Urine YELLOW (A) YELLOW   APPearance CLEAR (A) CLEAR   Specific Gravity, Urine 1.011 1.005 - 1.030   pH 5.0 5.0 - 8.0   Glucose, UA NEGATIVE NEGATIVE mg/dL   Hgb urine dipstick NEGATIVE NEGATIVE   Bilirubin Urine NEGATIVE NEGATIVE   Ketones, ur NEGATIVE NEGATIVE mg/dL   Protein, ur NEGATIVE NEGATIVE mg/dL   Nitrite NEGATIVE NEGATIVE   Leukocytes, UA NEGATIVE NEGATIVE   RBC / HPF 0-5 0 - 5 RBC/hpf   WBC, UA 0-5 0 - 5 WBC/hpf   Bacteria, UA RARE (A) NONE SEEN   Squamous Epithelial / LPF 0-5 (A) NONE SEEN   Mucus PRESENT   Troponin I  Result Value Ref Range     Troponin I <0.03 <0.03 ng/mL      Assessment & Plan:   Problem List Items Addressed This Visit      Unprioritized   Cough    uspect related to reflux and sleep  apnea.  Get sleep study      GERD (gastroesophageal reflux disease)    Suspect cough is related to this and sleep apnea.  Taking Omeprazole      Hyperlipidemia   Relevant Orders   Lipid Panel w/o Chol/HDL Ratio   Hypertensive CKD (chronic kidney disease)   Relevant Orders   CBC with Differential/Platelet   Myalgia   Relevant Orders   C-reactive protein   Sed Rate (ESR)   Rotator cuff tendonitis, left    Will refer to PT.  Unable to take NSAIDS      Sleep apnea    Suspect much of cough, fatigue, and myalgias related to this      Relevant Orders   Ambulatory referral to Sleep Studies   Uncontrolled type 2 diabetes mellitus with chronic kidney disease ([place]) - Primary   Relevant Orders   Comprehensive metabolic panel   CBC with Differential/Platelet   Hgb A1c w/o eAG       Follow up plan: Return in about 3 months (around 05/26/2017).

## 2017-02-24 NOTE — Assessment & Plan Note (Signed)
uspect related to reflux and sleep apnea.  Get sleep study

## 2017-02-24 NOTE — Assessment & Plan Note (Signed)
Will refer to PT.  Unable to take NSAIDS

## 2017-02-24 NOTE — Assessment & Plan Note (Signed)
Suspect cough is related to this and sleep apnea.  Taking Omeprazole

## 2017-02-24 NOTE — Assessment & Plan Note (Signed)
Suspect much of cough, fatigue, and myalgias related to this

## 2017-02-25 LAB — SEDIMENTATION RATE: SED RATE: 42 mm/h — AB (ref 0–40)

## 2017-02-25 LAB — COMPREHENSIVE METABOLIC PANEL
A/G RATIO: 2 (ref 1.2–2.2)
ALBUMIN: 4.3 g/dL (ref 3.5–4.8)
ALK PHOS: 76 IU/L (ref 39–117)
ALT: 13 IU/L (ref 0–32)
AST: 16 IU/L (ref 0–40)
BUN/Creatinine Ratio: 17 (ref 12–28)
BUN: 17 mg/dL (ref 8–27)
Bilirubin Total: <0.2 mg/dL (ref 0.0–1.2)
CO2: 23 mmol/L (ref 20–29)
CREATININE: 1.03 mg/dL — AB (ref 0.57–1.00)
Calcium: 9.6 mg/dL (ref 8.7–10.3)
Chloride: 102 mmol/L (ref 96–106)
GFR calc Af Amer: 62 mL/min/{1.73_m2} (ref >59)
GFR, EST NON AFRICAN AMERICAN: 54 mL/min/{1.73_m2} — AB (ref >59)
GLOBULIN, TOTAL: 2.1 g/dL (ref 1.5–4.5)
Glucose: 133 mg/dL — ABNORMAL HIGH (ref 65–99)
POTASSIUM: 4.5 mmol/L (ref 3.5–5.2)
SODIUM: 143 mmol/L (ref 134–144)
Total Protein: 6.4 g/dL (ref 6.0–8.5)

## 2017-02-25 LAB — CBC WITH DIFFERENTIAL/PLATELET
BASOS: 0 %
Basophils Absolute: 0 10*3/uL (ref 0.0–0.2)
EOS (ABSOLUTE): 0.3 10*3/uL (ref 0.0–0.4)
EOS: 5 %
HEMATOCRIT: 34.9 % (ref 34.0–46.6)
Hemoglobin: 10.7 g/dL — ABNORMAL LOW (ref 11.1–15.9)
IMMATURE GRANULOCYTES: 0 %
Immature Grans (Abs): 0 10*3/uL (ref 0.0–0.1)
LYMPHS ABS: 2.4 10*3/uL (ref 0.7–3.1)
Lymphs: 39 %
MCH: 22.7 pg — ABNORMAL LOW (ref 26.6–33.0)
MCHC: 30.7 g/dL — ABNORMAL LOW (ref 31.5–35.7)
MCV: 74 fL — AB (ref 79–97)
MONOS ABS: 0.4 10*3/uL (ref 0.1–0.9)
Monocytes: 7 %
NEUTROS PCT: 49 %
Neutrophils Absolute: 3 10*3/uL (ref 1.4–7.0)
PLATELETS: 249 10*3/uL (ref 150–379)
RBC: 4.71 x10E6/uL (ref 3.77–5.28)
RDW: 15.9 % — AB (ref 12.3–15.4)
WBC: 6 10*3/uL (ref 3.4–10.8)

## 2017-02-25 LAB — HGB A1C W/O EAG: HEMOGLOBIN A1C: 7.6 % — AB (ref 4.8–5.6)

## 2017-02-25 LAB — LIPID PANEL W/O CHOL/HDL RATIO
Cholesterol, Total: 177 mg/dL (ref 100–199)
HDL: 43 mg/dL (ref >39)
LDL Calculated: 117 mg/dL — ABNORMAL HIGH (ref 0–99)
Triglycerides: 85 mg/dL (ref 0–149)
VLDL Cholesterol Cal: 17 mg/dL (ref 5–40)

## 2017-02-25 LAB — C-REACTIVE PROTEIN: CRP: 1.3 mg/L (ref 0.0–4.9)

## 2017-02-27 ENCOUNTER — Encounter: Payer: Self-pay | Admitting: Unknown Physician Specialty

## 2017-03-03 DIAGNOSIS — M25512 Pain in left shoulder: Secondary | ICD-10-CM | POA: Diagnosis not present

## 2017-03-10 DIAGNOSIS — I10 Essential (primary) hypertension: Secondary | ICD-10-CM | POA: Diagnosis not present

## 2017-03-10 DIAGNOSIS — R0602 Shortness of breath: Secondary | ICD-10-CM | POA: Diagnosis not present

## 2017-03-10 DIAGNOSIS — I2510 Atherosclerotic heart disease of native coronary artery without angina pectoris: Secondary | ICD-10-CM | POA: Diagnosis not present

## 2017-03-10 DIAGNOSIS — R200 Anesthesia of skin: Secondary | ICD-10-CM | POA: Diagnosis not present

## 2017-03-10 DIAGNOSIS — M25512 Pain in left shoulder: Secondary | ICD-10-CM | POA: Diagnosis not present

## 2017-03-10 DIAGNOSIS — I209 Angina pectoris, unspecified: Secondary | ICD-10-CM | POA: Diagnosis not present

## 2017-03-12 DIAGNOSIS — M25512 Pain in left shoulder: Secondary | ICD-10-CM | POA: Diagnosis not present

## 2017-03-26 DIAGNOSIS — G4733 Obstructive sleep apnea (adult) (pediatric): Secondary | ICD-10-CM | POA: Diagnosis not present

## 2017-03-30 ENCOUNTER — Other Ambulatory Visit: Payer: Self-pay | Admitting: Unknown Physician Specialty

## 2017-04-14 ENCOUNTER — Other Ambulatory Visit: Payer: Self-pay | Admitting: Unknown Physician Specialty

## 2017-05-04 ENCOUNTER — Ambulatory Visit: Payer: Medicare Other

## 2017-05-04 ENCOUNTER — Ambulatory Visit (INDEPENDENT_AMBULATORY_CARE_PROVIDER_SITE_OTHER): Payer: Medicare Other

## 2017-05-04 DIAGNOSIS — E538 Deficiency of other specified B group vitamins: Secondary | ICD-10-CM

## 2017-05-15 ENCOUNTER — Other Ambulatory Visit: Payer: Self-pay | Admitting: Unknown Physician Specialty

## 2017-06-04 ENCOUNTER — Other Ambulatory Visit: Payer: Self-pay | Admitting: Unknown Physician Specialty

## 2017-06-20 ENCOUNTER — Other Ambulatory Visit: Payer: Self-pay | Admitting: Unknown Physician Specialty

## 2017-07-20 ENCOUNTER — Other Ambulatory Visit: Payer: Self-pay | Admitting: Unknown Physician Specialty

## 2017-07-28 ENCOUNTER — Ambulatory Visit: Payer: Medicare Other | Admitting: Unknown Physician Specialty

## 2017-07-28 ENCOUNTER — Encounter: Payer: Self-pay | Admitting: Unknown Physician Specialty

## 2017-07-28 VITALS — BP 155/78 | HR 88 | Temp 99.3°F | Wt 175.2 lb

## 2017-07-28 DIAGNOSIS — E1122 Type 2 diabetes mellitus with diabetic chronic kidney disease: Secondary | ICD-10-CM

## 2017-07-28 DIAGNOSIS — IMO0002 Reserved for concepts with insufficient information to code with codable children: Secondary | ICD-10-CM

## 2017-07-28 DIAGNOSIS — M545 Low back pain, unspecified: Secondary | ICD-10-CM

## 2017-07-28 DIAGNOSIS — I129 Hypertensive chronic kidney disease with stage 1 through stage 4 chronic kidney disease, or unspecified chronic kidney disease: Secondary | ICD-10-CM | POA: Diagnosis not present

## 2017-07-28 DIAGNOSIS — E538 Deficiency of other specified B group vitamins: Secondary | ICD-10-CM | POA: Diagnosis not present

## 2017-07-28 DIAGNOSIS — E1165 Type 2 diabetes mellitus with hyperglycemia: Secondary | ICD-10-CM

## 2017-07-28 LAB — UA/M W/RFLX CULTURE, ROUTINE
BILIRUBIN UA: NEGATIVE
Glucose, UA: NEGATIVE
KETONES UA: NEGATIVE
Leukocytes, UA: NEGATIVE
NITRITE UA: NEGATIVE
Protein, UA: NEGATIVE
RBC UA: NEGATIVE
SPEC GRAV UA: 1.025 (ref 1.005–1.030)
Urobilinogen, Ur: 1 mg/dL (ref 0.2–1.0)
pH, UA: 5.5 (ref 5.0–7.5)

## 2017-07-28 LAB — BAYER DCA HB A1C WAIVED: HB A1C: 7.3 % — AB (ref <7.0)

## 2017-07-28 MED ORDER — BACLOFEN 10 MG PO TABS: 10.0000 mg | ORAL_TABLET | Freq: Three times a day (TID) | ORAL | 0 refills | Status: DC

## 2017-07-28 MED ORDER — MELOXICAM 15 MG PO TABS: 15.0000 mg | ORAL_TABLET | Freq: Every day | ORAL | 0 refills | Status: DC

## 2017-07-28 MED ORDER — TRAMADOL HCL 50 MG PO TABS: 50.0000 mg | ORAL_TABLET | Freq: Three times a day (TID) | ORAL | 0 refills | Status: DC | PRN

## 2017-07-28 NOTE — Patient Instructions (Addendum)
Back Pain, Adult Back pain is very common. The pain often gets better over time. The cause of back pain is usually not dangerous. Most people can learn to manage their back pain on their own. Follow these instructions at home: Watch your back pain for any changes. The following actions may help to lessen any pain you are feeling:  Stay active. Start with short walks on flat ground if you can. Try to walk farther each day.  Exercise regularly as told by your doctor. Exercise helps your back heal faster. It also helps avoid future injury by keeping your muscles strong and flexible.  Do not sit, drive, or stand in one place for more than 30 minutes.  Do not stay in bed. Resting more than 1-2 days can slow down your recovery.  Be careful when you bend or lift an object. Use good form when lifting: ? Bend at your knees. ? Keep the object close to your body. ? Do not twist.  Sleep on a firm mattress. Lie on your side, and bend your knees. If you lie on your back, put a pillow under your knees.  Take medicines only as told by your doctor.  Put ice on the injured area. ? Put ice in a plastic bag. ? Place a towel between your skin and the bag. ? Leave the ice on for 20 minutes, 2-3 times a day for the first 2-3 days. After that, you can switch between ice and heat packs.  Avoid feeling anxious or stressed. Find good ways to deal with stress, such as exercise.  Maintain a healthy weight. Extra weight puts stress on your back.  Contact a doctor if:  You have pain that does not go away with rest or medicine.  You have worsening pain that goes down into your legs or buttocks.  You have pain that does not get better in one week.  You have pain at night.  You lose weight.  You have a fever or chills. Get help right away if:  You cannot control when you poop (bowel movement) or pee (urinate).  Your arms or legs feel weak.  Your arms or legs lose feeling (numbness).  You feel sick  to your stomach (nauseous) or throw up (vomit).  You have belly (abdominal) pain.  You feel like you may pass out (faint). This information is not intended to replace advice given to you by your health care provider. Make sure you discuss any questions you have with your health care provider. Document Released: 11/26/2007 Document Revised: 11/15/2015 Document Reviewed: 10/11/2013 Elsevier Interactive Patient Education  2018 [place].  Back Exercises The following exercises strengthen the muscles that help to support the back. They also help to keep the lower back flexible. Doing these exercises can help to prevent back pain or lessen existing pain. If you have back pain or discomfort, try doing these exercises 2-3 times each day or as told by your health care provider. When the pain goes away, do them once each day, but increase the number of times that you repeat the steps for each exercise (do more repetitions). If you do not have back pain or discomfort, do these exercises once each day or as told by your health care provider. Exercises Single Knee to Chest  Repeat these steps 3-5 times for each leg: 1. Lie on your back on a firm bed or the floor with your legs extended. 2. Bring one knee to your chest. Your other leg should  stay extended and in contact with the floor. 3. Hold your knee in place by grabbing your knee or thigh. 4. Pull on your knee until you feel a gentle stretch in your lower back. 5. Hold the stretch for 10-30 seconds. 6. Slowly release and straighten your leg.  Pelvic Tilt  Repeat these steps 5-10 times: 1. Lie on your back on a firm bed or the floor with your legs extended. 2. Bend your knees so they are pointing toward the ceiling and your feet are flat on the floor. 3. Tighten your lower abdominal muscles to press your lower back against the floor. This motion will tilt your pelvis so your tailbone points up toward the ceiling instead of pointing to your feet  or the floor. 4. With gentle tension and even breathing, hold this position for 5-10 seconds.  Cat-Cow  Repeat these steps until your lower back becomes more flexible: 1. Get into a hands-and-knees position on a firm surface. Keep your hands under your shoulders, and keep your knees under your hips. You may place padding under your knees for comfort. 2. Let your head hang down, and point your tailbone toward the floor so your lower back becomes rounded like the back of a cat. 3. Hold this position for 5 seconds. 4. Slowly lift your head and point your tailbone up toward the ceiling so your back forms a sagging arch like the back of a cow. 5. Hold this position for 5 seconds.  Press-Ups  Repeat these steps 5-10 times: 1. Lie on your abdomen (face-down) on the floor. 2. Place your palms near your head, about shoulder-width apart. 3. While you keep your back as relaxed as possible and keep your hips on the floor, slowly straighten your arms to raise the top half of your body and lift your shoulders. Do not use your back muscles to raise your upper torso. You may adjust the placement of your hands to make yourself more comfortable. 4. Hold this position for 5 seconds while you keep your back relaxed. 5. Slowly return to lying flat on the floor.  Bridges  Repeat these steps 10 times: 1. Lie on your back on a firm surface. 2. Bend your knees so they are pointing toward the ceiling and your feet are flat on the floor. 3. Tighten your buttocks muscles and lift your buttocks off of the floor until your waist is at almost the same height as your knees. You should feel the muscles working in your buttocks and the back of your thighs. If you do not feel these muscles, slide your feet 1-2 inches farther away from your buttocks. 4. Hold this position for 3-5 seconds. 5. Slowly lower your hips to the starting position, and allow your buttocks muscles to relax completely.  If this exercise is too easy,  try doing it with your arms crossed over your chest. Abdominal Crunches  Repeat these steps 5-10 times: 1. Lie on your back on a firm bed or the floor with your legs extended. 2. Bend your knees so they are pointing toward the ceiling and your feet are flat on the floor. 3. Cross your arms over your chest. 4. Tip your chin slightly toward your chest without bending your neck. 5. Tighten your abdominal muscles and slowly raise your trunk (torso) high enough to lift your shoulder blades a tiny bit off of the floor. Avoid raising your torso higher than that, because it can put too much stress on your low back  and it does not help to strengthen your abdominal muscles. 6. Slowly return to your starting position.  Back Lifts Repeat these steps 5-10 times: 1. Lie on your abdomen (face-down) with your arms at your sides, and rest your forehead on the floor. 2. Tighten the muscles in your legs and your buttocks. 3. Slowly lift your chest off of the floor while you keep your hips pressed to the floor. Keep the back of your head in line with the curve in your back. Your eyes should be looking at the floor. 4. Hold this position for 3-5 seconds. 5. Slowly return to your starting position.  Contact a health care provider if:  Your back pain or discomfort gets much worse when you do an exercise.  Your back pain or discomfort does not lessen within 2 hours after you exercise. If you have any of these problems, stop doing these exercises right away. Do not do them again unless your health care provider says that you can. Get help right away if:  You develop sudden, severe back pain. If this happens, stop doing the exercises right away. Do not do them again unless your health care provider says that you can. This information is not intended to replace advice given to you by your health care provider. Make sure you discuss any questions you have with your health care provider. Document Released:  07/17/2004 Document Revised: 10/17/2015 Document Reviewed: 08/03/2014 Elsevier Interactive Patient Education  2017 [employer].

## 2017-07-28 NOTE — Assessment & Plan Note (Signed)
In pain today.  Difficult to assess.

## 2017-07-28 NOTE — Assessment & Plan Note (Signed)
Hgb A1C is 7.3.  Some improvement.  She will work on cutting out sweet tea

## 2017-07-28 NOTE — Progress Notes (Signed)
BP (!) 155/78   Pulse 88   Temp 99.3 F (37.4 C) (Oral)   Wt 175 lb 3.2 oz (79.5 kg)   LMP  (LMP Unknown)   SpO2 96%   BMI 33.10 kg/m    Subjective:    Patient ID: Tina Mcdonald, female    DOB: September 28, 1940, 77 y.o.   MRN: [id number]  HPI: Tina Mcdonald is a 77 y.o. female  Chief Complaint  Patient presents with  . Pain    pt states she fell off of her porch over the weekend and her back and left hip have been hurting very bad since. States she has been taking aleve with no relief   Back Pain  This is a new problem. Episode onset: several days ago. The problem occurs constantly. The problem has been gradually worsening since onset. The pain is present in the lumbar spine. The quality of the pain is described as aching. The pain does not radiate. The pain is worse during the night. Exacerbated by: walking, turning, getting up from sitting. Pertinent negatives include no abdominal pain, bladder incontinence, bowel incontinence, chest pain, dysuria, fever, headaches, leg pain, numbness, paresis, paresthesias, pelvic pain, perianal numbness, tingling, weakness or weight loss. Risk factors include sedentary lifestyle and obesity. She has tried NSAIDs for the symptoms. The treatment provided no relief.   Diabetes: Lost to f/u as was due to be seen in December Using medications without difficulties No hypoglycemic episodes No hyperglycemic episodes Blood Sugars averaging: 125 eye exam within last year Last Hgb A1C: 7.6  Hypertension  Using medications without difficulty Average home BPs Not checking   Using medication without problems or lightheadedness No chest pain with exertion or shortness of breath No Edema  Elevated Cholesterol Using medications without problems No Muscle aches      Relevant past medical, surgical, family and social history reviewed and updated as indicated. Interim medical history since our last visit reviewed. Allergies and medications reviewed  and updated.  Review of Systems  Constitutional: Negative for fever and weight loss.  Cardiovascular: Negative for chest pain.  Gastrointestinal: Negative for abdominal pain and bowel incontinence.  Genitourinary: Negative for bladder incontinence, dysuria and pelvic pain.  Musculoskeletal: Positive for back pain.  Neurological: Negative for tingling, weakness, numbness, headaches and paresthesias.    Per HPI unless specifically indicated above     Objective:    BP (!) 155/78   Pulse 88   Temp 99.3 F (37.4 C) (Oral)   Wt 175 lb 3.2 oz (79.5 kg)   LMP  (LMP Unknown)   SpO2 96%   BMI 33.10 kg/m   Wt Readings from Last 3 Encounters:  07/28/17 175 lb 3.2 oz (79.5 kg)  02/24/17 175 lb 12.8 oz (79.7 kg)  02/05/17 180 lb (81.6 kg)    Physical Exam  Constitutional: She is oriented to person, place, and time. She appears well-developed and well-nourished. No distress.  Gait atalgic  HENT:  Head: Normocephalic and atraumatic.  Eyes: Conjunctivae and lids are normal. Right eye exhibits no discharge. Left eye exhibits no discharge. No scleral icterus.  Neck: Normal range of motion. Neck supple. No JVD present. Carotid bruit is not present.  Cardiovascular: Normal rate, regular rhythm and normal heart sounds.  Pulmonary/Chest: Effort normal and breath sounds normal.  Abdominal: Normal appearance. There is no splenomegaly or hepatomegaly.  Musculoskeletal:       Lumbar back: She exhibits decreased range of motion and tenderness. She exhibits no bony tenderness.  Neurological: She is alert and oriented to person, place, and time.  Skin: Skin is warm, dry and intact. No rash noted. No pallor.  Psychiatric: She has a normal mood and affect. Her behavior is normal. Judgment and thought content normal.    Results for orders placed or performed in visit on 02/24/17  Comprehensive metabolic panel  Result Value Ref Range   Glucose 133 (H) 65 - 99 mg/dL   BUN 17 8 - 27 mg/dL    Creatinine, Ser 1.03 (H) 0.57 - 1.00 mg/dL   GFR calc non Af Amer 54 (L) >59 mL/min/1.73   GFR calc Af Amer 62 >59 mL/min/1.73   BUN/Creatinine Ratio 17 12 - 28   Sodium 143 134 - 144 mmol/L   Potassium 4.5 3.5 - 5.2 mmol/L   Chloride 102 96 - 106 mmol/L   CO2 23 20 - 29 mmol/L   Calcium 9.6 8.7 - 10.3 mg/dL   Total Protein 6.4 6.0 - 8.5 g/dL   Albumin 4.3 3.5 - 4.8 g/dL   Globulin, Total 2.1 1.5 - 4.5 g/dL   Albumin/Globulin Ratio 2.0 1.2 - 2.2   Bilirubin Total <0.2 0.0 - 1.2 mg/dL   Alkaline Phosphatase 76 39 - 117 IU/L   AST 16 0 - 40 IU/L   ALT 13 0 - 32 IU/L  CBC with Differential/Platelet  Result Value Ref Range   WBC 6.0 3.4 - 10.8 x10E3/uL   RBC 4.71 3.77 - 5.28 x10E6/uL   Hemoglobin 10.7 (L) 11.1 - 15.9 g/dL   Hematocrit 34.9 34.0 - 46.6 %   MCV 74 (L) 79 - 97 fL   MCH 22.7 (L) 26.6 - 33.0 pg   MCHC 30.7 (L) 31.5 - 35.7 g/dL   RDW 15.9 (H) 12.3 - 15.4 %   Platelets 249 150 - 379 x10E3/uL   Neutrophils 49 Not Estab. %   Lymphs 39 Not Estab. %   Monocytes 7 Not Estab. %   Eos 5 Not Estab. %   Basos 0 Not Estab. %   Neutrophils Absolute 3.0 1.4 - 7.0 x10E3/uL   Lymphocytes Absolute 2.4 0.7 - 3.1 x10E3/uL   Monocytes Absolute 0.4 0.1 - 0.9 x10E3/uL   EOS (ABSOLUTE) 0.3 0.0 - 0.4 x10E3/uL   Basophils Absolute 0.0 0.0 - 0.2 x10E3/uL   Immature Granulocytes 0 Not Estab. %   Immature Grans (Abs) 0.0 0.0 - 0.1 x10E3/uL  Lipid Panel w/o Chol/HDL Ratio  Result Value Ref Range   Cholesterol, Total 177 100 - 199 mg/dL   Triglycerides 85 0 - 149 mg/dL   HDL 43 >39 mg/dL   VLDL Cholesterol Cal 17 5 - 40 mg/dL   LDL Calculated 117 (H) 0 - 99 mg/dL  C-reactive protein  Result Value Ref Range   CRP 1.3 0.0 - 4.9 mg/L  Sed Rate (ESR)  Result Value Ref Range   Sed Rate 42 (H) 0 - 40 mm/hr  Hgb A1c w/o eAG  Result Value Ref Range   Hgb A1c MFr Bld 7.6 (H) 4.8 - 5.6 %      Assessment & Plan:   Problem List Items Addressed This Visit      Unprioritized   Hypertensive  CKD (chronic kidney disease)    In pain today.  Difficult to assess.        Uncontrolled type 2 diabetes mellitus with chronic kidney disease (HCC)    Hgb A1C is 7.3.  Some improvement.  She will work on cutting out sweet tea  Relevant Orders   Comprehensive metabolic panel   Bayer DCA Hb A1c Waived   Vitamin B12 deficiency    Other Visit Diagnoses    Acute left-sided low back pain without sciatica    -  Primary   Symptoms consistent with low back strain.  Rx for Meloxicam 15 mg but only for 10 days due to CKD.  Rx for Tramdol for severe pain.  Baclofen as a muslce relaxa   Relevant Medications   meloxicam (MOBIC) 15 MG tablet   traMADol (ULTRAM) 50 MG tablet   baclofen (LIORESAL) 10 MG tablet   Other Relevant Orders   UA/M w/rflx Culture, Routine       Follow up plan: Return in about 3 months (around 10/25/2017).

## 2017-07-29 ENCOUNTER — Encounter: Payer: Self-pay | Admitting: Unknown Physician Specialty

## 2017-07-29 LAB — COMPREHENSIVE METABOLIC PANEL
A/G RATIO: 1.7 (ref 1.2–2.2)
ALK PHOS: 75 IU/L (ref 39–117)
ALT: 17 IU/L (ref 0–32)
AST: 15 IU/L (ref 0–40)
Albumin: 4.5 g/dL (ref 3.5–4.8)
BILIRUBIN TOTAL: 0.4 mg/dL (ref 0.0–1.2)
BUN/Creatinine Ratio: 20 (ref 12–28)
BUN: 17 mg/dL (ref 8–27)
CHLORIDE: 104 mmol/L (ref 96–106)
CO2: 21 mmol/L (ref 20–29)
Calcium: 10 mg/dL (ref 8.7–10.3)
Creatinine, Ser: 0.84 mg/dL (ref 0.57–1.00)
GFR calc Af Amer: 79 mL/min/{1.73_m2} (ref >59)
GFR calc non Af Amer: 68 mL/min/{1.73_m2} (ref >59)
GLUCOSE: 115 mg/dL — AB (ref 65–99)
Globulin, Total: 2.7 g/dL (ref 1.5–4.5)
POTASSIUM: 4.5 mmol/L (ref 3.5–5.2)
Sodium: 143 mmol/L (ref 134–144)
Total Protein: 7.2 g/dL (ref 6.0–8.5)

## 2017-07-31 ENCOUNTER — Other Ambulatory Visit: Payer: Self-pay | Admitting: Unknown Physician Specialty

## 2017-08-26 ENCOUNTER — Other Ambulatory Visit: Payer: Self-pay | Admitting: Unknown Physician Specialty

## 2017-09-02 ENCOUNTER — Other Ambulatory Visit: Payer: Self-pay | Admitting: Unknown Physician Specialty

## 2017-09-09 ENCOUNTER — Other Ambulatory Visit: Payer: Self-pay | Admitting: Unknown Physician Specialty

## 2017-10-28 ENCOUNTER — Encounter: Payer: Self-pay | Admitting: Unknown Physician Specialty

## 2017-10-28 ENCOUNTER — Ambulatory Visit (INDEPENDENT_AMBULATORY_CARE_PROVIDER_SITE_OTHER): Payer: Medicare Other | Admitting: Unknown Physician Specialty

## 2017-10-28 VITALS — BP 159/81 | HR 76 | Temp 98.0°F | Ht 61.0 in | Wt 174.7 lb

## 2017-10-28 DIAGNOSIS — IMO0002 Reserved for concepts with insufficient information to code with codable children: Secondary | ICD-10-CM

## 2017-10-28 DIAGNOSIS — E538 Deficiency of other specified B group vitamins: Secondary | ICD-10-CM

## 2017-10-28 DIAGNOSIS — I10 Essential (primary) hypertension: Secondary | ICD-10-CM

## 2017-10-28 DIAGNOSIS — Z1231 Encounter for screening mammogram for malignant neoplasm of breast: Secondary | ICD-10-CM

## 2017-10-28 DIAGNOSIS — E1165 Type 2 diabetes mellitus with hyperglycemia: Secondary | ICD-10-CM

## 2017-10-28 DIAGNOSIS — N3941 Urge incontinence: Secondary | ICD-10-CM

## 2017-10-28 DIAGNOSIS — E782 Mixed hyperlipidemia: Secondary | ICD-10-CM | POA: Diagnosis not present

## 2017-10-28 DIAGNOSIS — R0602 Shortness of breath: Secondary | ICD-10-CM | POA: Diagnosis not present

## 2017-10-28 DIAGNOSIS — E1122 Type 2 diabetes mellitus with diabetic chronic kidney disease: Secondary | ICD-10-CM

## 2017-10-28 DIAGNOSIS — Z1239 Encounter for other screening for malignant neoplasm of breast: Secondary | ICD-10-CM

## 2017-10-28 LAB — BAYER DCA HB A1C WAIVED: HB A1C: 7.2 % — AB (ref <7.0)

## 2017-10-28 NOTE — Assessment & Plan Note (Addendum)
Not to goal.  Feels it is up related to allergies.  Did not take medications this AM

## 2017-10-28 NOTE — Progress Notes (Signed)
BP (!) 159/81   Pulse 76   Temp 98 F (36.7 C) (Oral)   Ht 5\' 1"  (1.549 m)   Wt 174 lb 11.2 oz (79.2 kg)   LMP  (LMP Unknown)   SpO2 97%   BMI 33.01 kg/m    Subjective:    Patient ID: Tina Mcdonald, female    DOB: 1940-06-26, 77 y.o.   MRN: [id number]  HPI: Tina Mcdonald is a 77 y.o. female  Chief Complaint  Patient presents with  . Diabetes    pt states she has not had a recent eye exam  . Hyperlipidemia  . Hypertension   Diabetes: Using medications without difficulties No hypoglycemic episodes No hyperglycemic episodes Feet problems: none Blood Sugars averaging: Last Hgb A1C:7.3  Hypertension  Using medications without difficulty but did not take today Average home BPs Does not check    Using medication without problems or lightheadedness No chest pain with exertion or shortness of breath No Edema  Elevated Cholesterol Using medications without problems No Muscle aches  Diet: Exercise:Not as good as they could be  Social History   Socioeconomic History  . Marital status: Single    Spouse name: Not on file  . Number of children: Not on file  . Years of education: Not on file  . Highest education level: Not on file  Occupational History  . Not on file  Social Needs  . Financial resource strain: Not on file  . Food insecurity:    Worry: Not on file    Inability: Not on file  . Transportation needs:    Medical: Not on file    Non-medical: Not on file  Tobacco Use  . Smoking status: Former Smoker    Last attempt to quit: 11/22/1997    Years since quitting: 19.9  . Smokeless tobacco: Never Used  Substance and Sexual Activity  . Alcohol use: No  . Drug use: No  . Sexual activity: Never  Lifestyle  . Physical activity:    Days per week: Not on file    Minutes per session: Not on file  . Stress: Not on file  Relationships  . Social connections:    Talks on phone: Not on file    Gets together: Not on file    Attends religious service: Not  on file    Active member of club or organization: Not on file    Attends meetings of clubs or organizations: Not on file    Relationship status: Not on file  . Intimate partner violence:    Fear of current or ex partner: Not on file    Emotionally abused: Not on file    Physically abused: Not on file    Forced sexual activity: Not on file  Other Topics Concern  . Not on file  Social History Narrative  . Not on file     Relevant past medical, surgical, family and social history reviewed and updated as indicated. Interim medical history since our last visit reviewed. Allergies and medications reviewed and updated.  Review of Systems  Respiratory: Positive for cough and shortness of breath.     Per HPI unless specifically indicated above     Objective:    BP (!) 159/81   Pulse 76   Temp 98 F (36.7 C) (Oral)   Ht 5\' 1"  (1.549 m)   Wt 174 lb 11.2 oz (79.2 kg)   LMP  (LMP Unknown)   SpO2 97%   BMI 33.01  kg/m   Wt Readings from Last 3 Encounters:  10/28/17 174 lb 11.2 oz (79.2 kg)  07/28/17 175 lb 3.2 oz (79.5 kg)  02/24/17 175 lb 12.8 oz (79.7 kg)    Physical Exam  Constitutional: She is oriented to person, place, and time. She appears well-developed and well-nourished. No distress.  HENT:  Head: Normocephalic and atraumatic.  Eyes: Conjunctivae and lids are normal. Right eye exhibits no discharge. Left eye exhibits no discharge. No scleral icterus.  Neck: Normal range of motion. Neck supple. No JVD present. Carotid bruit is not present.  Cardiovascular: Normal rate, regular rhythm and normal heart sounds.  Pulmonary/Chest: Effort normal and breath sounds normal.  Abdominal: Normal appearance. There is no splenomegaly or hepatomegaly.  Musculoskeletal: Normal range of motion.  Neurological: She is alert and oriented to person, place, and time.  Skin: Skin is warm, dry and intact. No rash noted. No pallor.  Psychiatric: She has a normal mood and affect. Her behavior  is normal. Judgment and thought content normal.    Results for orders placed or performed in visit on 07/28/17  UA/M w/rflx Culture, Routine  Result Value Ref Range   Specific Gravity, UA 1.025 1.005 - 1.030   pH, UA 5.5 5.0 - 7.5   Color, UA Yellow Yellow   Appearance Ur Hazy (A) Clear   Leukocytes, UA Negative Negative   Protein, UA Negative Negative/Trace   Glucose, UA Negative Negative   Ketones, UA Negative Negative   RBC, UA Negative Negative   Bilirubin, UA Negative Negative   Urobilinogen, Ur 1.0 0.2 - 1.0 mg/dL   Nitrite, UA Negative Negative  Comprehensive metabolic panel  Result Value Ref Range   Glucose 115 (H) 65 - 99 mg/dL   BUN 17 8 - 27 mg/dL   Creatinine, Ser 0.84 0.57 - 1.00 mg/dL   GFR calc non Af Amer 68 >59 mL/min/1.73   GFR calc Af Amer 79 >59 mL/min/1.73   BUN/Creatinine Ratio 20 12 - 28   Sodium 143 134 - 144 mmol/L   Potassium 4.5 3.5 - 5.2 mmol/L   Chloride 104 96 - 106 mmol/L   CO2 21 20 - 29 mmol/L   Calcium 10.0 8.7 - 10.3 mg/dL   Total Protein 7.2 6.0 - 8.5 g/dL   Albumin 4.5 3.5 - 4.8 g/dL   Globulin, Total 2.7 1.5 - 4.5 g/dL   Albumin/Globulin Ratio 1.7 1.2 - 2.2   Bilirubin Total 0.4 0.0 - 1.2 mg/dL   Alkaline Phosphatase 75 39 - 117 IU/L   AST 15 0 - 40 IU/L   ALT 17 0 - 32 IU/L  Bayer DCA Hb A1c Waived  Result Value Ref Range   Bayer DCA Hb A1c Waived 7.3 (H) <7.0 %      Assessment & Plan:   Problem List Items Addressed This Visit      Unprioritized   Hyperlipidemia - Primary    Stable, continue present medications.        Relevant Orders   Lipid Panel w/o Chol/HDL Ratio   Hypertension    Not to goal.  Feels it is up related to allergies.  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  Pt states she will cut back on potatoes and sugar drinks.  Refer to lifestyle center.  Recheck  10months  Relevant Orders   Bayer DCA Hb A1c Waived   Microalbumin, Urine Waived   Amb Referral to Nutrition and Diabetic [facility]   Urge incontinence    Cost benefit of Ditropan discussed.  Pt elected to stop medication          Follow up plan: Return in about 3 months (around 01/28/2018) for Recheck 3 months for Diabetes but I need an earlier appt to evaluate SOB.

## 2017-10-28 NOTE — Assessment & Plan Note (Addendum)
Hgb A1C is 7.2.  Pt states she will cut back on potatoes and sugar drinks.  Refer to lifestyle center.  Recheck 68months

## 2017-10-28 NOTE — Assessment & Plan Note (Signed)
Stable, continue present medications.   

## 2017-10-28 NOTE — Assessment & Plan Note (Signed)
SOB and leg weakness with mild activity

## 2017-10-28 NOTE — Assessment & Plan Note (Signed)
Cost benefit of Ditropan discussed.  Pt elected to stop medication

## 2017-10-29 LAB — COMPREHENSIVE METABOLIC PANEL
ALK PHOS: 68 IU/L (ref 39–117)
ALT: 13 IU/L (ref 0–32)
AST: 13 IU/L (ref 0–40)
Albumin/Globulin Ratio: 1.5 (ref 1.2–2.2)
Albumin: 4.1 g/dL (ref 3.5–4.8)
BILIRUBIN TOTAL: 0.3 mg/dL (ref 0.0–1.2)
BUN/Creatinine Ratio: 19 (ref 12–28)
BUN: 19 mg/dL (ref 8–27)
CHLORIDE: 99 mmol/L (ref 96–106)
CO2: 26 mmol/L (ref 20–29)
Calcium: 10.1 mg/dL (ref 8.7–10.3)
Creatinine, Ser: 0.98 mg/dL (ref 0.57–1.00)
GFR calc Af Amer: 65 mL/min/{1.73_m2} (ref >59)
GFR calc non Af Amer: 57 mL/min/{1.73_m2} — ABNORMAL LOW (ref >59)
GLUCOSE: 122 mg/dL — AB (ref 65–99)
Globulin, Total: 2.7 g/dL (ref 1.5–4.5)
Potassium: 3.9 mmol/L (ref 3.5–5.2)
Sodium: 139 mmol/L (ref 134–144)
Total Protein: 6.8 g/dL (ref 6.0–8.5)

## 2017-10-29 LAB — LIPID PANEL W/O CHOL/HDL RATIO
CHOLESTEROL TOTAL: 182 mg/dL (ref 100–199)
HDL: 50 mg/dL (ref >39)
LDL Calculated: 111 mg/dL — ABNORMAL HIGH (ref 0–99)
Triglycerides: 104 mg/dL (ref 0–149)
VLDL CHOLESTEROL CAL: 21 mg/dL (ref 5–40)

## 2017-11-02 ENCOUNTER — Encounter: Payer: Self-pay | Admitting: Unknown Physician Specialty

## 2017-11-02 ENCOUNTER — Ambulatory Visit
Admission: RE | Admit: 2017-11-02 | Discharge: 2017-11-02 | Disposition: A | Discharge status: Home / Self Care | Payer: Medicare Other | Source: Ambulatory Visit | Attending: Unknown Physician Specialty | Admitting: Unknown Physician Specialty

## 2017-11-02 DIAGNOSIS — R0602 Shortness of breath: Secondary | ICD-10-CM

## 2017-11-02 DIAGNOSIS — R05 Cough: Secondary | ICD-10-CM | POA: Diagnosis not present

## 2017-11-04 ENCOUNTER — Other Ambulatory Visit: Payer: Self-pay | Admitting: Unknown Physician Specialty

## 2017-11-04 ENCOUNTER — Other Ambulatory Visit: Payer: Self-pay | Admitting: Family Medicine

## 2017-11-10 ENCOUNTER — Other Ambulatory Visit: Payer: Self-pay | Admitting: Unknown Physician Specialty

## 2017-11-24 ENCOUNTER — Ambulatory Visit: Payer: Medicare Other | Admitting: Dietician

## 2017-11-27 ENCOUNTER — Other Ambulatory Visit: Payer: Self-pay

## 2017-11-27 ENCOUNTER — Encounter: Payer: Self-pay | Admitting: Emergency Medicine

## 2017-11-27 ENCOUNTER — Ambulatory Visit
Admission: EM | Admit: 2017-11-27 | Discharge: 2017-11-27 | Disposition: A | Discharge status: Home / Self Care | Payer: Medicare Other | Attending: Family Medicine | Admitting: Family Medicine

## 2017-11-27 DIAGNOSIS — E1140 Type 2 diabetes mellitus with diabetic neuropathy, unspecified: Secondary | ICD-10-CM | POA: Diagnosis not present

## 2017-11-27 DIAGNOSIS — M7918 Myalgia, other site: Secondary | ICD-10-CM

## 2017-11-27 DIAGNOSIS — E785 Hyperlipidemia, unspecified: Secondary | ICD-10-CM | POA: Diagnosis not present

## 2017-11-27 DIAGNOSIS — Z882 Allergy status to sulfonamides status: Secondary | ICD-10-CM | POA: Insufficient documentation

## 2017-11-27 DIAGNOSIS — Z880 Allergy status to penicillin: Secondary | ICD-10-CM | POA: Insufficient documentation

## 2017-11-27 DIAGNOSIS — Z87891 Personal history of nicotine dependence: Secondary | ICD-10-CM | POA: Insufficient documentation

## 2017-11-27 DIAGNOSIS — E1122 Type 2 diabetes mellitus with diabetic chronic kidney disease: Secondary | ICD-10-CM | POA: Insufficient documentation

## 2017-11-27 DIAGNOSIS — I129 Hypertensive chronic kidney disease with stage 1 through stage 4 chronic kidney disease, or unspecified chronic kidney disease: Secondary | ICD-10-CM | POA: Insufficient documentation

## 2017-11-27 DIAGNOSIS — M170 Bilateral primary osteoarthritis of knee: Secondary | ICD-10-CM | POA: Insufficient documentation

## 2017-11-27 DIAGNOSIS — N189 Chronic kidney disease, unspecified: Secondary | ICD-10-CM | POA: Diagnosis not present

## 2017-11-27 DIAGNOSIS — Z7984 Long term (current) use of oral hypoglycemic drugs: Secondary | ICD-10-CM | POA: Diagnosis not present

## 2017-11-27 DIAGNOSIS — M546 Pain in thoracic spine: Secondary | ICD-10-CM | POA: Diagnosis present

## 2017-11-27 DIAGNOSIS — Z79899 Other long term (current) drug therapy: Secondary | ICD-10-CM | POA: Diagnosis not present

## 2017-11-27 DIAGNOSIS — R079 Chest pain, unspecified: Secondary | ICD-10-CM | POA: Diagnosis not present

## 2017-11-27 MED ORDER — BACLOFEN 10 MG PO TABS: 5.0000 mg | ORAL_TABLET | Freq: Three times a day (TID) | ORAL | 0 refills | Status: DC | PRN

## 2017-11-27 NOTE — ED Provider Notes (Signed)
[facility]    CSN: [id number] Arrival date & time: 11/27/17  1501  History   Chief Complaint Chief Complaint  Patient presents with  . Back Pain   HPI  77 year old female presents with thoracic back pain, right-sided chest pain, and right shoulder pain.  Patient states that this started on Wednesday.  She states that she awoke and had neck pain as well as back pain located between the scapula.  This is continued to persist.  She reports associated right anterior chest pain and right shoulder pain.  She states that she has had some shortness of breath but this is fairly common for her.  No left-sided chest pain. Pain is worse with range of motion.  She has taken Aleve without improvement.  She did have some improvement with hot shower.  No other associated symptoms. No other complaints.  Past Medical History:  Diagnosis Date  . Anemia   . Arthritis   . Chronic kidney disease   . Colon polyps   . Diabetes mellitus without complication ([place])   . Diabetic neuropathy ([place])   . GERD (gastroesophageal reflux disease)   . Hyperlipemia   . Hypertension   . Osteoarthritis   . Radiculopathy   . Sleep apnea    does not have her cpap anymore  . Stress incontinence   . Urticaria   . Vitamin B 12 deficiency   . Wears glasses     Patient Active Problem List   Diagnosis Date Noted  . SOB (shortness of breath) 10/28/2017  . Myalgia 02/24/2017  . Sleep apnea 02/24/2017  . Rotator cuff tendonitis, left 02/24/2017  . Urge incontinence 11/24/2016  . Benign neoplasm of sigmoid colon   . Cough 10/17/2015  . CKD (chronic kidney disease) 02/20/2015  . Colon polyps 01/16/2015  . Anemia 01/16/2015  . Vitamin B12 deficiency 01/16/2015  . Radiculopathy 01/16/2015  . Stress incontinence 01/16/2015  . Urticaria 01/16/2015  . Uncontrolled type 2 diabetes mellitus with chronic kidney disease ([place]) 01/16/2015  . Osteoarthritis of both knees 01/16/2015  . Hypertension 01/16/2015  .  Hyperlipidemia 01/16/2015  . Diabetic neuropathy ([place]) 01/16/2015  . Hypertensive CKD (chronic kidney disease) 01/16/2015  . GERD (gastroesophageal reflux disease) 01/16/2015    Past Surgical History:  Procedure Laterality Date  . ABDOMINAL HYSTERECTOMY    . APPENDECTOMY    . BREAST BIOPSY Right    right-neg  . BREAST BIOPSY Left    us/bx/clip-neg  . CARPAL TUNNEL RELEASE     right  . COLONOSCOPY    . COLONOSCOPY WITH PROPOFOL N/A 12/10/2015   Procedure: COLONOSCOPY WITH PROPOFOL;  Surgeon: Lucilla Lame, MD;  Location: [place];  Service: Endoscopy;  Laterality: N/A;  Diabetic - oral meds Sleep Apnea LATEX allergy  . CYSTOSCOPY    . DILATION AND CURETTAGE OF UTERUS    . EYE SURGERY Bilateral    lens implant  . LUMBAR FUSION  2009  . POLYPECTOMY  12/10/2015   Procedure: POLYPECTOMY;  Surgeon: Lucilla Lame, MD;  Location: [place];  Service: Endoscopy;;  . RESECTION DISTAL CLAVICAL Right 11/25/2013   Procedure: RIGHT SHOULDER OPEN RESECTION DISTAL CLAVICAL EXCISION SOFT TISSUE TUMOR SHOULDER DEEP SUBFASCIAL INTRAMUSCULAR/DEBRIDEMENT/ROTATOR CUFF REPAIR/BICEPS TENODESIS/MANIPULATION;  Surgeon: Renette Butters, MD;  Location: [place];  Service: Orthopedics;  Laterality: Right;  . SHOULDER ARTHROSCOPY WITH ROTATOR CUFF REPAIR AND SUBACROMIAL DECOMPRESSION Right 11/25/2013   Procedure: SHOULDER ARTHROSCOPY WITH DEBRIDEMENT ROTATOR CUFF REPAIR  A;  Surgeon: Ernesta Amble  Percell Miller, MD;  Location: [place];  Service: Orthopedics;  Laterality: Right;    OB History   None      Home Medications    Prior to Admission medications   Medication Sig Start Date End Date Taking? Authorizing Provider  atorvastatin (LIPITOR) 20 MG tablet TAKE 1 TABLET BY MOUTH ONCE DAILY 11/11/17  Yes Kathrine Haddock, NP  cloNIDine (CATAPRES) 0.1 MG tablet Take 0.1 mg by mouth 2 (two) times daily.   Yes [provider]  hydrochlorothiazide (HYDRODIURIL) 25  MG tablet TAKE 1 TABLET BY MOUTH ONCE DAILY 11/04/17  Yes Crissman, Jeannette How, MD  metFORMIN (GLUCOPHAGE-XR) 500 MG 24 hr tablet TAKE 2 TABLETS BY MOUTH TWICE DAILY 06/05/17  Yes Kathrine Haddock, NP  metoprolol succinate (TOPROL-XL) 50 MG 24 hr tablet TAKE 1 TABLET BY MOUTH ONCE DAILY 11/04/17  Yes Kathrine Haddock, NP  baclofen (LIORESAL) 10 MG tablet Take 0.5-1 tablets (5-10 mg total) by mouth 3 (three) times daily as needed for muscle spasms. 11/27/17   Coral Spikes, DO  cyanocobalamin (,VITAMIN B-12,) 1000 MCG/ML injection Inject 1 mL (1,000 mcg total) into the muscle once. 03/21/15   Kathrine Haddock, NP  glucose blood test strip Check daily. ICD 10 E11.9 01/16/15   Kathrine Haddock, NP  omeprazole (PRILOSEC) 20 MG capsule TAKE 1 CAPSULE BY MOUTH ONCE DAILY 09/09/17   Kathrine Haddock, NP    Family History Family History  Problem Relation Age of Onset  . Heart disease Mother   . Diabetes Mother   . Hypertension Mother   . Heart disease Father   . Hypertension Father   . Diabetes Sister   . Hypertension Sister   . Heart disease Sister   . Diabetes Sister   . Hypertension Sister   . Heart disease Son 35  . Breast cancer Cousin 52    Social History Social History   Tobacco Use  . Smoking status: Former Smoker    Last attempt to quit: 11/22/1997    Years since quitting: 20.0  . Smokeless tobacco: Never Used  Substance Use Topics  . Alcohol use: No  . Drug use: No     Allergies   Contrast media [iodinated diagnostic agents]; Penicillins; Sulfa antibiotics; Latex; Neosporin [neomycin-bacitracin zn-polymyx]; and Other   Review of Systems Review of Systems  Constitutional: Negative.   Cardiovascular: Positive for chest pain.  Musculoskeletal: Positive for back pain and neck pain.   Physical Exam Triage Vital Signs ED Triage Vitals  Enc Vitals Group     BP 11/27/17 1519 (!) 164/74     Pulse Rate 11/27/17 1519 (!) 105     Resp 11/27/17 1519 18     Temp 11/27/17 1519 98.9 F (37.2 C)       Temp Source 11/27/17 1519 Oral     SpO2 11/27/17 1519 98 %     Weight 11/27/17 1515 170 lb (77.1 kg)     Height 11/27/17 1515 5\' 1"  (1.549 m)     Head Circumference --      Peak Flow --      Pain Score 11/27/17 1515 10     Pain Loc --      Pain Edu? --      Excl. in [place]? --    Updated Vital Signs BP (!) 164/74 (BP Location: Left Arm)   Pulse (!) 105   Temp 98.9 F (37.2 C) (Oral)   Resp 18   Ht 5\' 1"  (1.549 m)   Wt  170 lb (77.1 kg)   LMP  (LMP Unknown)   SpO2 98%   BMI 32.12 kg/m   Physical Exam  Constitutional: She is oriented to person, place, and time. She appears well-developed. No distress.  HENT:  Head: Normocephalic and atraumatic.  Cardiovascular: Normal rate and regular rhythm.  Pulmonary/Chest: Effort normal and breath sounds normal. She has no wheezes. She has no rales. She exhibits tenderness.  Musculoskeletal:  Patient with spasm and exquisite pain of the paraspinal musculature between the scapulas.  Decreased range of motion of the neck in all planes.  Neurological: She is alert and oriented to person, place, and time.  Psychiatric: She has a normal mood and affect. Her behavior is normal.  Nursing note and vitals reviewed.  UC Treatments / Results  Labs (all labs ordered are listed, but only abnormal results are displayed) Labs Reviewed - No data to display  EKG Interpretion: Sinus tachycardia with a rate of 110.  T wave inversion in lead III.  Occasional PVC.  Radiology No results found.  Procedures Procedures (including critical care time)  Medications Ordered in UC Medications - No data to display  Initial Impression / Assessment and Plan / UC Course  I have reviewed the triage vital signs and the nursing notes.  Pertinent labs & imaging results that were available during my care of the patient were reviewed by me and considered in my medical decision making (see chart for details).    77 year old female presents with MSK pain.   Treating with baclofen.  Advised to fill her prescription for tramadol which she was given by her primary.  Tylenol as needed  Final Clinical Impressions(s) / UC Diagnoses   Final diagnoses:  Musculoskeletal pain     Discharge Instructions     Use the tramadol as prescribed.  Baclofen for spasm.  Tylenol as needed.  Take care  Dr. Lacinda Axon    ED Prescriptions    Medication Sig Dispense Auth. Provider   baclofen (LIORESAL) 10 MG tablet Take 0.5-1 tablets (5-10 mg total) by mouth 3 (three) times daily as needed for muscle spasms. 51 each Coral Spikes, DO     Controlled Substance Prescriptions [place] Controlled Substance Registry consulted? Not Applicable   Coral Spikes, DO 11/27/17 1637

## 2017-11-27 NOTE — Discharge Instructions (Signed)
Use the tramadol as prescribed.  Baclofen for spasm.  Tylenol as needed.  Take care  Dr. Lacinda Axon

## 2017-11-27 NOTE — ED Triage Notes (Signed)
Patient states she woke Wednesday morning she had a stiff neck and it has progressively gotten worse currently patient states she is having shoulder and chest pain

## 2017-12-02 ENCOUNTER — Ambulatory Visit: Payer: Medicare Other | Admitting: Unknown Physician Specialty

## 2017-12-08 ENCOUNTER — Encounter: Payer: Self-pay | Admitting: Dietician

## 2017-12-08 ENCOUNTER — Encounter: Payer: Medicare Other | Attending: Unknown Physician Specialty | Admitting: Dietician

## 2017-12-08 VITALS — Ht 61.0 in | Wt 173.1 lb

## 2017-12-08 DIAGNOSIS — I129 Hypertensive chronic kidney disease with stage 1 through stage 4 chronic kidney disease, or unspecified chronic kidney disease: Secondary | ICD-10-CM | POA: Diagnosis not present

## 2017-12-08 DIAGNOSIS — E1122 Type 2 diabetes mellitus with diabetic chronic kidney disease: Secondary | ICD-10-CM | POA: Insufficient documentation

## 2017-12-08 DIAGNOSIS — E785 Hyperlipidemia, unspecified: Secondary | ICD-10-CM | POA: Diagnosis not present

## 2017-12-08 DIAGNOSIS — Z713 Dietary counseling and surveillance: Secondary | ICD-10-CM | POA: Diagnosis not present

## 2017-12-08 DIAGNOSIS — N182 Chronic kidney disease, stage 2 (mild): Secondary | ICD-10-CM | POA: Diagnosis not present

## 2017-12-08 NOTE — Patient Instructions (Signed)
Balance meals with protein (meat, eggs, peanut butter, pinto beans or other dried beans, cheese), 2-3 servings of carbohydrate (starch or fruit or milk) and non-starchy vegetables. When decreasing sodium, start with Morton's lite  Salt and use as little as possible with goal eventually of no added salt. Try seasonings such as Ms. DASH- lemon pepper, garlic herb are good. Read labels for sodium content with eventual goal of no more than 1500mg  per day or 500 per meal. Include at least 6 oz of protein daily. Glucerna = 2 oz., 1 egg = 1 oz, meat the size of a deck of cards = 3 oz, 2Tbsp of peanut butter = 1 oz, 1/4 cup nuts= 1 oz, 1/2 cup of dried beans = 1 oz.

## 2017-12-08 NOTE — Progress Notes (Signed)
Medical Nutrition Therapy: Visit start time: 6160  end time: 1430 Assessment:  Diagnosis: Type 2 Diabetes, kidney disease Past medical history: hypertension, hyperlipidemia Psychosocial issues/ stress concerns: none identified with exception of her constant shoulder and lower back pain  Preferred learning method:  . Auditory . Visual . Hands-on Current weight: 173.1 lb Height: 61 in Medications, supplements: see list  Progress and evaluation:  Patient in for initial medical nutrition therapy appointment. She states, "I've never really paid that much attention to what I eat. I have stayed so active that I didn't think the food really mattered" Reports that she has not had a good appetite in recent weeks which she attributes to pain she is experiencing.  She drinks a glucerna for breakfast and may or may not have another meal. She does very little cooking. "I like to cook, but it's just me there unless family comes over". She sometimes eats a "frozen dinner" or eats a sandwich with some type of luncheon meat. She snacks in the evening on fritos or candy bars. She drinks 2-3 (16oz) bottles of water daily, occasional diet soda and tea, sometimes sweetened with sugar and sometimes with artificial sweetener. She eats "out" 2-3 meals per week. She states that she loves salt and salts about everything she eats. Her most recent HgA1c was 7.2 and has remained relatively stable over the past year. Her GFR was 57 with potassium in a normal range. Physical activity: none   Nutrition Care Education: Diabetes:  Instructed on general meal plan guidelines for diabetes including how to better balance protein, carbohydrate and non-starchy vegetables. Used food guide plate, food models and "Planning a Healthy Meal" handout for teaching. Gave and reviewed sample menus and discussed simple meals she could prepare that involved little preparation time. Kidney disease: Discussed importance of glucose control to  help improve function of kidneys. Instructed on recommendations for sodium and stressed importance of decreasing sodium in her diet as part of kidney health. Gave and reviewed list of high sodium foods and specific product alternatives. Showed how to read labels for sodium and carbohydrate amounts.  Nutritional Diagnosis:  NI-5.10.2 Excessive mineral intake (specify): sodium As related to salting of most foods, chips and other high sodium snacks.  As evidenced by diet history..  Intervention:  Balance meals with protein (meat, eggs, peanut butter, pinto beans or other dried beans, cheese), 2-3 servings of carbohydrate (starch or fruit or milk) and non-starchy vegetables. When decreasing sodium, start with Morton's lite  Salt and use as little as possible with goal eventually of no added salt. Try seasonings such as Ms. DASH- lemon pepper, garlic herb are good. Read labels for sodium content with eventual goal of no more than 1500mg  per day or 500 per meal. Include at least 6 oz of protein daily. Glucerna = 2 oz., 1 egg = 1 oz, meat the size of a deck of cards = 3 oz, 2Tbsp of peanut butter = 1 oz, 1/4 cup nuts= 1 oz, 1/2 cup of dried beans = 1 oz.  Education Materials given:  . General diet guidelines for Diabetes . Plate Planner . Food lists/ Planning A Balanced Meal . Sample meal pattern/ menus . Kidney Health . Goals/ instructions  Learner/ who was taught:  . Patient  Level of understanding: . Partial understanding; needs review/ practice Demonstrated degree of understanding via:   Teach back Learning barriers: . None Willingness to learn/ readiness for change: . Acceptance, ready for change  Monitoring and Evaluation:  Dietary  intake, exercise,  and body weight      follow up: Follow-up: July 29th at 3:00pm

## 2017-12-15 ENCOUNTER — Other Ambulatory Visit: Payer: Self-pay | Admitting: Family Medicine

## 2017-12-15 ENCOUNTER — Other Ambulatory Visit: Payer: Self-pay | Admitting: Unknown Physician Specialty

## 2017-12-17 NOTE — Telephone Encounter (Signed)
HCTZ refill Last Refill:11/04/17 # 30 tab No RF Last OV: 10/28/17 PCP: Kathrine Haddock NP [place]

## 2017-12-17 NOTE — Telephone Encounter (Signed)
Metformin refill Last Refill:06/05/17 # 360 tab No RF Last OV: 10/28/17 PCP: Kathrine Haddock NP Pharmacy:[facility] Last Hgb A1C:10/28/17

## 2018-01-06 ENCOUNTER — Encounter: Payer: Self-pay | Admitting: Unknown Physician Specialty

## 2018-01-13 ENCOUNTER — Ambulatory Visit (INDEPENDENT_AMBULATORY_CARE_PROVIDER_SITE_OTHER): Payer: Medicare Other

## 2018-01-13 DIAGNOSIS — E538 Deficiency of other specified B group vitamins: Secondary | ICD-10-CM

## 2018-01-18 ENCOUNTER — Ambulatory Visit: Payer: Medicare Other | Admitting: Dietician

## 2018-01-26 ENCOUNTER — Encounter: Payer: Self-pay | Admitting: Dietician

## 2018-02-03 ENCOUNTER — Ambulatory Visit: Payer: Medicare Other | Admitting: Unknown Physician Specialty

## 2018-02-03 ENCOUNTER — Ambulatory Visit: Payer: Medicare Other | Admitting: Physician Assistant

## 2018-02-09 ENCOUNTER — Other Ambulatory Visit: Payer: Self-pay | Admitting: Unknown Physician Specialty

## 2018-02-10 ENCOUNTER — Ambulatory Visit: Payer: Medicare Other

## 2018-02-10 ENCOUNTER — Ambulatory Visit: Payer: Medicare Other | Admitting: Physician Assistant

## 2018-02-11 ENCOUNTER — Ambulatory Visit: Payer: Medicare Other

## 2018-02-11 ENCOUNTER — Ambulatory Visit: Payer: Medicare Other | Admitting: Physician Assistant

## 2018-03-03 DIAGNOSIS — R200 Anesthesia of skin: Secondary | ICD-10-CM | POA: Diagnosis not present

## 2018-03-03 DIAGNOSIS — I209 Angina pectoris, unspecified: Secondary | ICD-10-CM | POA: Diagnosis not present

## 2018-03-03 DIAGNOSIS — R05 Cough: Secondary | ICD-10-CM | POA: Diagnosis not present

## 2018-03-03 DIAGNOSIS — R0602 Shortness of breath: Secondary | ICD-10-CM | POA: Diagnosis not present

## 2018-03-03 DIAGNOSIS — I10 Essential (primary) hypertension: Secondary | ICD-10-CM | POA: Diagnosis not present

## 2018-03-04 ENCOUNTER — Ambulatory Visit (INDEPENDENT_AMBULATORY_CARE_PROVIDER_SITE_OTHER): Payer: Medicare Other

## 2018-03-04 VITALS — BP 130/78 | HR 85 | Temp 97.6°F | Resp 16 | Ht 62.0 in | Wt 178.8 lb

## 2018-03-04 DIAGNOSIS — Z0000 Encounter for general adult medical examination without abnormal findings: Secondary | ICD-10-CM

## 2018-03-04 DIAGNOSIS — E538 Deficiency of other specified B group vitamins: Secondary | ICD-10-CM

## 2018-03-04 MED ORDER — CYANOCOBALAMIN 1000 MCG/ML IJ SOLN
1000.0000 ug | Freq: Once | INTRAMUSCULAR | Status: AC
  Administered 2018-03-04: 1000 ug via INTRAMUSCULAR

## 2018-03-04 NOTE — Patient Instructions (Addendum)
[name] , Thank you for taking time to come for your Medicare Wellness Visit. I appreciate your ongoing commitment to your health goals. Please review the following plan we discussed and let me know if I can assist you in the future.   Screening recommendations/referrals: Colonoscopy: completed 12/10/2015 Mammogram: no longer requried Bone Density: completed 02/20/2016 Recommended yearly ophthalmology/optometry visit for glaucoma screening and checkup Recommended yearly dental visit for hygiene and checkup  Vaccinations: Influenza vaccine: due now - declined  Pneumococcal vaccine: completed Tdap vaccine: up to date Shingles vaccine: shingrix eligible. Check with your insurance company for coverage     Advanced directives: Please bring a copy of your health care power of attorney and living will to the office at your convenience.  Conditions/risks identified: Recommend drinking at least 6-7 glasses of water a day   Next appointment: Follow up in one year for your annual wellness exam.    Preventive Care 65 Years and Older, Female Preventive care refers to lifestyle choices and visits with your health care provider that can promote health and wellness. What does preventive care include?  A yearly physical exam. This is also called an annual well check.  Dental exams once or twice a year.  Routine eye exams. Ask your health care provider how often you should have your eyes checked.  Personal lifestyle choices, including:  Daily care of your teeth and gums.  Regular physical activity.  Eating a healthy diet.  Avoiding tobacco and drug use.  Limiting alcohol use.  Practicing safe sex.  Taking low-dose aspirin every day.  Taking vitamin and mineral supplements as recommended by your health care provider. What happens during an annual well check? The services and screenings done by your health care provider during your annual well check will depend on your age, overall  health, lifestyle risk factors, and family history of disease. Counseling  Your health care provider may ask you questions about your:  Alcohol use.  Tobacco use.  Drug use.  Emotional well-being.  Home and relationship well-being.  Sexual activity.  Eating habits.  History of falls.  Memory and ability to understand (cognition).  Work and work Statistician.  Reproductive health. Screening  You may have the following tests or measurements:  Height, weight, and BMI.  Blood pressure.  Lipid and cholesterol levels. These may be checked every 5 years, or more frequently if you are over 86 years old.  Skin check.  Lung cancer screening. You may have this screening every year starting at age 33 if you have a 30-pack-year history of smoking and currently smoke or have quit within the past 15 years.  Fecal occult blood test (FOBT) of the stool. You may have this test every year starting at age 37.  Flexible sigmoidoscopy or colonoscopy. You may have a sigmoidoscopy every 5 years or a colonoscopy every 10 years starting at age 50.  Hepatitis C blood test.  Hepatitis B blood test.  Sexually transmitted disease (STD) testing.  Diabetes screening. This is done by checking your blood sugar (glucose) after you have not eaten for a while (fasting). You may have this done every 1-3 years.  Bone density scan. This is done to screen for osteoporosis. You may have this done starting at age 41.  Mammogram. This may be done every 1-2 years. Talk to your health care provider about how often you should have regular mammograms. Talk with your health care provider about your test results, treatment options, and if necessary, the need  for more tests. Vaccines  Your health care provider may recommend certain vaccines, such as:  Influenza vaccine. This is recommended every year.  Tetanus, diphtheria, and acellular pertussis (Tdap, Td) vaccine. You may need a Td booster every 10  years.  Zoster vaccine. You may need this after age 31.  Pneumococcal 13-valent conjugate (PCV13) vaccine. One dose is recommended after age 84.  Pneumococcal polysaccharide (PPSV23) vaccine. One dose is recommended after age 53. Talk to your health care provider about which screenings and vaccines you need and how often you need them. This information is not intended to replace advice given to you by your health care provider. Make sure you discuss any questions you have with your health care provider. Document Released: 07/06/2015 Document Revised: 02/27/2016 Document Reviewed: 04/10/2015 Elsevier Interactive Patient Education  2017 [place] Prevention in the Home Falls can cause injuries. They can happen to people of all ages. There are many things you can do to make your home safe and to help prevent falls. What can I do on the outside of my home?  Regularly fix the edges of walkways and driveways and fix any cracks.  Remove anything that might make you trip as you walk through a door, such as a raised step or threshold.  Trim any bushes or trees on the path to your home.  Use bright outdoor lighting.  Clear any walking paths of anything that might make someone trip, such as rocks or tools.  Regularly check to see if handrails are loose or broken. Make sure that both sides of any steps have handrails.  Any raised decks and porches should have guardrails on the edges.  Have any leaves, snow, or ice cleared regularly.  Use sand or salt on walking paths during winter.  Clean up any spills in your garage right away. This includes oil or grease spills. What can I do in the bathroom?  Use night lights.  Install grab bars by the toilet and in the tub and shower. Do not use towel bars as grab bars.  Use non-skid mats or decals in the tub or shower.  If you need to sit down in the shower, use a plastic, non-slip stool.  Keep the floor dry. Clean up any water that  spills on the floor as soon as it happens.  Remove soap buildup in the tub or shower regularly.  Attach bath mats securely with double-sided non-slip rug tape.  Do not have throw rugs and other things on the floor that can make you trip. What can I do in the bedroom?  Use night lights.  Make sure that you have a light by your bed that is easy to reach.  Do not use any sheets or blankets that are too big for your bed. They should not hang down onto the floor.  Have a firm chair that has side arms. You can use this for support while you get dressed.  Do not have throw rugs and other things on the floor that can make you trip. What can I do in the kitchen?  Clean up any spills right away.  Avoid walking on wet floors.  Keep items that you use a lot in easy-to-reach places.  If you need to reach something above you, use a strong step stool that has a grab bar.  Keep electrical cords out of the way.  Do not use floor polish or wax that makes floors slippery. If you must use wax, use  non-skid floor wax.  Do not have throw rugs and other things on the floor that can make you trip. What can I do with my stairs?  Do not leave any items on the stairs.  Make sure that there are handrails on both sides of the stairs and use them. Fix handrails that are broken or loose. Make sure that handrails are as long as the stairways.  Check any carpeting to make sure that it is firmly attached to the stairs. Fix any carpet that is loose or worn.  Avoid having throw rugs at the top or bottom of the stairs. If you do have throw rugs, attach them to the floor with carpet tape.  Make sure that you have a light switch at the top of the stairs and the bottom of the stairs. If you do not have them, ask someone to add them for you. What else can I do to help prevent falls?  Wear shoes that:  Do not have high heels.  Have rubber bottoms.  Are comfortable and fit you well.  Are closed at the  toe. Do not wear sandals.  If you use a stepladder:  Make sure that it is fully opened. Do not climb a closed stepladder.  Make sure that both sides of the stepladder are locked into place.  Ask someone to hold it for you, if possible.  Clearly mark and make sure that you can see:  Any grab bars or handrails.  First and last steps.  Where the edge of each step is.  Use tools that help you move around (mobility aids) if they are needed. These include:  Canes.  Walkers.  Scooters.  Crutches.  Turn on the lights when you go into a dark area. Replace any light bulbs as soon as they burn out.  Set up your furniture so you have a clear path. Avoid moving your furniture around.  If any of your floors are uneven, fix them.  If there are any pets around you, be aware of where they are.  Review your medicines with your doctor. Some medicines can make you feel dizzy. This can increase your chance of falling. Ask your doctor what other things that you can do to help prevent falls. This information is not intended to replace advice given to you by your health care provider. Make sure you discuss any questions you have with your health care provider. Document Released: 04/05/2009 Document Revised: 11/15/2015 Document Reviewed: 07/14/2014 Elsevier Interactive Patient Education  2017 [employer].

## 2018-03-04 NOTE — Progress Notes (Signed)
Michaell Cowing   Subjective:   Tina Mcdonald is a 77 y.o. female who presents for Medicare Annual (Subsequent) preventive examination.  Review of Systems:   Cardiac Risk Factors include: hypertension;advanced age (>54men, >76 women);diabetes mellitus;dyslipidemia;obesity (BMI >30kg/m2)     Objective:     Vitals: BP 130/78 (BP Location: Left Arm, Patient Position: Sitting)   Pulse 85   Temp 97.6 F (36.4 C) (Temporal)   Resp 16   Ht 5\' 2"  (1.575 m)   Wt 178 lb 12.8 oz (81.1 kg)   LMP  (LMP Unknown)   BMI 32.70 kg/m   Body mass index is 32.7 kg/m.  Advanced Directives 03/04/2018 12/08/2017 02/05/2017 02/05/2017 12/10/2015 11/20/2015 11/22/2013  Does Patient Have a Medical Advance Directive? Yes Yes No No;Yes Yes Yes Patient has advance directive, copy not in chart  Type of Advance Directive Living will;Healthcare Power of Attorney Living will;Healthcare Power of [place];Living will [place];Living will Living will;Healthcare Power of [place];Living will Living will;Healthcare Power of Attorney  Does patient want to make changes to medical advance directive? - - - - - No - Patient declined No change requested  Copy of [place] in Chart? No - copy requested - - No - copy requested No - copy requested No - copy requested -    Tobacco Social History   Tobacco Use  Smoking Status Former Smoker  . Last attempt to quit: 11/22/1997  . Years since quitting: 20.2  Smokeless Tobacco Never Used     Counseling given: Not Answered   Clinical Intake:  Pre-visit preparation completed: Yes  Pain : No/denies pain     Nutritional Status: BMI > 30  Obese Nutritional Risks: None Diabetes: Yes CBG done?: No Did pt. bring in CBG monitor from home?: No  How often do you need to have someone help you when you read instructions, pamphlets, or other written materials from your doctor or pharmacy?: 1 -  Never What is the last grade level you completed in school?: some college   Interpreter Needed?: No  Information entered by :: Rachard Isidro,LPN   Past Medical History:  Diagnosis Date  . Anemia   . Arthritis   . Chronic kidney disease   . Colon polyps   . Diabetes mellitus without complication ([place])   . Diabetic neuropathy ([place])   . GERD (gastroesophageal reflux disease)   . Hyperlipemia   . Hypertension   . Osteoarthritis   . Radiculopathy   . Sleep apnea    does not have her cpap anymore  . Stress incontinence   . Urticaria   . Vitamin B 12 deficiency   . Wears glasses    Past Surgical History:  Procedure Laterality Date  . ABDOMINAL HYSTERECTOMY    . APPENDECTOMY    . BREAST BIOPSY Right    right-neg  . BREAST BIOPSY Left    us/bx/clip-neg  . CARPAL TUNNEL RELEASE     right  . COLONOSCOPY    . COLONOSCOPY WITH PROPOFOL N/A 12/10/2015   Procedure: COLONOSCOPY WITH PROPOFOL;  Surgeon: Lucilla Lame, MD;  Location: [facility];  Service: Endoscopy;  Laterality: N/A;  Diabetic - oral meds Sleep Apnea LATEX allergy  . CYSTOSCOPY    . DILATION AND CURETTAGE OF UTERUS    . EYE SURGERY Bilateral    lens implant  . LUMBAR FUSION  2009  . POLYPECTOMY  12/10/2015   Procedure: POLYPECTOMY;  Surgeon: Evangeline Gula  Allen Norris, MD;  Location: [facility];  Service: Endoscopy;;  . RESECTION DISTAL CLAVICAL Right 11/25/2013   Procedure: RIGHT SHOULDER OPEN RESECTION DISTAL CLAVICAL EXCISION SOFT TISSUE TUMOR SHOULDER DEEP SUBFASCIAL INTRAMUSCULAR/DEBRIDEMENT/ROTATOR CUFF REPAIR/BICEPS TENODESIS/MANIPULATION;  Surgeon: Renette Butters, MD;  Location: [facility];  Service: Orthopedics;  Laterality: Right;  . SHOULDER ARTHROSCOPY WITH ROTATOR CUFF REPAIR AND SUBACROMIAL DECOMPRESSION Right 11/25/2013   Procedure: SHOULDER ARTHROSCOPY WITH DEBRIDEMENT ROTATOR CUFF REPAIR  A;  Surgeon: Renette Butters, MD;  Location: [facility];  Service: Orthopedics;   Laterality: Right;   Family History  Problem Relation Age of Onset  . Heart disease Mother   . Diabetes Mother   . Hypertension Mother   . Heart disease Father   . Hypertension Father   . Diabetes Sister   . Hypertension Sister   . Heart disease Sister   . Diabetes Sister   . Hypertension Sister   . Heart disease Son 47  . Breast cancer Cousin 41   Social History   Socioeconomic History  . Marital status: Single    Spouse name: Not on file  . Number of children: Not on file  . Years of education: Not on file  . Highest education level: Some college, no degree  Occupational History  . Not on file  Social Needs  . Financial resource strain: Not hard at all  . Food insecurity:    Worry: Never true    Inability: Never true  . Transportation needs:    Medical: No    Non-medical: No  Tobacco Use  . Smoking status: Former Smoker    Last attempt to quit: 11/22/1997    Years since quitting: 20.2  . Smokeless tobacco: Never Used  Substance and Sexual Activity  . Alcohol use: No  . Drug use: No  . Sexual activity: Never  Lifestyle  . Physical activity:    Days per week: 0 days    Minutes per session: 0 min  . Stress: Not at all  Relationships  . Social connections:    Talks on phone: More than three times a week    Gets together: More than three times a week    Attends religious service: More than 4 times per year    Active member of club or organization: No    Attends meetings of clubs or organizations: Never    Relationship status: Not on file  Other Topics Concern  . Not on file  Social History Narrative  . Not on file    Outpatient Encounter Medications as of 03/04/2018  Medication Sig  . acetaminophen (TYLENOL) 325 MG tablet Take by mouth every 6 (six) hours as needed.  [name] atorvastatin (LIPITOR) 20 MG tablet TAKE 1 TABLET BY MOUTH ONCE DAILY  . cloNIDine (CATAPRES) 0.1 MG tablet Take 0.1 mg by mouth 2 (two) times daily.  . hydrochlorothiazide (HYDRODIURIL) 25  MG tablet TAKE 1 TABLET BY MOUTH ONCE DAILY  . metFORMIN (GLUCOPHAGE-XR) 500 MG 24 hr tablet TAKE 2 TABLETS BY MOUTH TWICE DAILY  . metoprolol succinate (TOPROL-XL) 50 MG 24 hr tablet TAKE 1 TABLET BY MOUTH ONCE DAILY  . omeprazole (PRILOSEC) 20 MG capsule TAKE 1 CAPSULE BY MOUTH ONCE DAILY  . baclofen (LIORESAL) 10 MG tablet Take 0.5-1 tablets (5-10 mg total) by mouth 3 (three) times daily as needed for muscle spasms. (Patient not taking: Reported on 12/08/2017)  . cyanocobalamin (,VITAMIN B-12,) 1000 MCG/ML injection Inject 1 mL (1,000 mcg total) into  the muscle once. (Patient not taking: Reported on 03/04/2018)  . glucose blood test strip Check daily. ICD 10 E11.9   Facility-Administered Encounter Medications as of 03/04/2018  Medication  . cyanocobalamin ((VITAMIN B-12)) injection 1,000 mcg  . [COMPLETED] cyanocobalamin ((VITAMIN B-12)) injection 1,000 mcg    Activities of Daily Living In your present state of health, do you have any difficulty performing the following activities: 03/04/2018  Hearing? N  Vision? N  Difficulty concentrating or making decisions? N  Walking or climbing stairs? Y  Comment sob   Dressing or bathing? N  Doing errands, shopping? N  Preparing Food and eating ? N  Using the Toilet? N  In the past six months, have you accidently leaked urine? Y  Comment pads for leakage   Do you have problems with loss of bowel control? N  Managing your Medications? N  Managing your Finances? N  Housekeeping or managing your Housekeeping? N  Some recent data might be hidden    Patient Care Team: Kathrine Haddock, NP as PCP - General (Nurse Practitioner) Yolonda Kida, MD (Internal Medicine)    Assessment:   This is a routine wellness examination for Tina Mcdonald.  Exercise Activities and Dietary recommendations Current Exercise Habits: The patient does not participate in regular exercise at present, Exercise limited by: None identified  Goals    . Increase water  intake     Recommend drinking at least 6-7 glasses of water a day        Fall Risk Fall Risk  03/04/2018 12/08/2017 02/05/2017 11/24/2016 11/20/2015  Falls in the past year? No No Yes No No  Number falls in past yr: - - 1 - -  Injury with Fall? - - No - -  Follow up - - Falls prevention discussed - -   Is the patient's home free of loose throw rugs in walkways, pet beds, electrical cords, etc?   yes      Grab bars in the bathroom? no      Handrails on the stairs?   yes      Adequate lighting?   yes  Timed Get Up and Go performed: Completed in 8 seconds with no use of assistive devices, steady gait. No intervention needed at this time.   Depression Screen PHQ 2/9 Scores 03/04/2018 12/08/2017 02/05/2017 11/24/2016  PHQ - 2 Score 0 0 0 0  PHQ- 9 Score - - - 4     Cognitive Function     6CIT Screen 03/04/2018 02/05/2017  What Year? 0 points 0 points  What month? 0 points 0 points  What time? 0 points 0 points  Count back from 20 0 points 0 points  Months in reverse 0 points 0 points  Repeat phrase 0 points 0 points  Total Score 0 0    Immunization History  Administered Date(s) Administered  . Pneumococcal Conjugate-13 10/12/2013  . Pneumococcal Polysaccharide-23 05/28/2010  . Td 10/02/2006    Qualifies for Shingles Vaccine? Yes, discussed shingrix vaccine   Screening Tests Health Maintenance  Topic Date Due  . OPHTHALMOLOGY EXAM  09/03/2016  . INFLUENZA VACCINE  01/21/2018  . FOOT EXAM  02/24/2018  . TETANUS/TDAP  10/29/2018 (Originally 10/01/2016)  . HEMOGLOBIN A1C  04/30/2018  . COLONOSCOPY  12/09/2025  . DEXA SCAN  Completed  . PNA vac Low Risk Adult  Completed    Cancer Screenings: Lung: Low Dose CT Chest recommended if Age 76-80 years, 30 pack-year currently smoking OR have quit w/in 15years. Patient  does not qualify. Breast:  Up to date on Mammogram? No  Longer required Up to date of Bone Density/Dexa? Yes 02/20/2016 Colorectal: completed 12/10/2015  Additional  Screenings:  Hepatitis C Screening: not indicated     Plan:    I have personally reviewed and addressed the Medicare Annual Wellness questionnaire and have noted the following in the patient's chart:  A. Medical and social history B. Use of alcohol, tobacco or illicit drugs  C. Current medications and supplements D. Functional ability and status E.  Nutritional status F.  Physical activity G. Advance directives H. List of other physicians I.  Hospitalizations, surgeries, and ER visits in previous 12 months J.  [place] such as hearing and vision if needed, cognitive and depression L. Referrals and appointments   In addition, I have reviewed and discussed with patient certain preventive protocols, quality metrics, and best practice recommendations. A written personalized care plan for preventive services as well as general preventive health recommendations were provided to patient.   Signed,  Tyler Aas, LPN Nurse Health Advisor  Nurse Notes: discussed diabetic eye exam- sees Dr.Bell, will schedule an appt and have results faxed when completed   Due for diabetic foot exam- advised to schedule appt with Jolene [name],NP.  Patient requested her B12 injection to be done while she was here, verbal order from [employer].

## 2018-03-24 DIAGNOSIS — R0602 Shortness of breath: Secondary | ICD-10-CM | POA: Diagnosis not present

## 2018-03-25 ENCOUNTER — Other Ambulatory Visit: Payer: Self-pay | Admitting: Unknown Physician Specialty

## 2018-03-25 MED ORDER — HYDROCHLOROTHIAZIDE 25 MG PO TABS: 25.0000 mg | ORAL_TABLET | Freq: Every day | ORAL | 0 refills | Status: DC

## 2018-03-25 NOTE — Telephone Encounter (Signed)
Copied from [place] [id number]. Topic: Quick Communication - Rx Refill/Question >> Mar 25, 2018 11:31 AM Yvette Rack wrote: Medication: hydrochlorothiazide (HYDRODIURIL) 25 MG tablet  Has the patient contacted their pharmacy? Yes.   (Agent: If no, request that the patient contact the pharmacy for the refill.) (Agent: If yes, when and what did the pharmacy advise?) pharmacy states that they sent a request over Friday and today  Preferred Pharmacy (with phone number or street name):     [facility] [place] - [place] [phone] (Phone) [phone] (Fax)    Agent: Please be advised that RX refills may take up to 3 business days. We ask that you follow-up with your pharmacy.

## 2018-03-25 NOTE — Telephone Encounter (Signed)
hydrochlorothiazide refill Last Refill:12/17/17 # 30   4 refills Last OV: 10/28/17 PCP: C. [facility] [place]

## 2018-04-07 ENCOUNTER — Ambulatory Visit: Payer: Medicare Other | Admitting: Nurse Practitioner

## 2018-04-07 ENCOUNTER — Other Ambulatory Visit: Payer: Self-pay

## 2018-04-07 ENCOUNTER — Encounter: Payer: Self-pay | Admitting: Nurse Practitioner

## 2018-04-07 DIAGNOSIS — R609 Edema, unspecified: Secondary | ICD-10-CM | POA: Insufficient documentation

## 2018-04-07 DIAGNOSIS — I10 Essential (primary) hypertension: Secondary | ICD-10-CM

## 2018-04-07 DIAGNOSIS — E1165 Type 2 diabetes mellitus with hyperglycemia: Secondary | ICD-10-CM | POA: Diagnosis not present

## 2018-04-07 DIAGNOSIS — E1122 Type 2 diabetes mellitus with diabetic chronic kidney disease: Secondary | ICD-10-CM

## 2018-04-07 DIAGNOSIS — E782 Mixed hyperlipidemia: Secondary | ICD-10-CM

## 2018-04-07 DIAGNOSIS — IMO0002 Reserved for concepts with insufficient information to code with codable children: Secondary | ICD-10-CM

## 2018-04-07 DIAGNOSIS — R600 Localized edema: Secondary | ICD-10-CM

## 2018-04-07 DIAGNOSIS — E538 Deficiency of other specified B group vitamins: Secondary | ICD-10-CM

## 2018-04-07 DIAGNOSIS — R0609 Other forms of dyspnea: Secondary | ICD-10-CM | POA: Diagnosis not present

## 2018-04-07 DIAGNOSIS — R05 Cough: Secondary | ICD-10-CM | POA: Diagnosis not present

## 2018-04-07 DIAGNOSIS — D649 Anemia, unspecified: Secondary | ICD-10-CM

## 2018-04-07 DIAGNOSIS — R0602 Shortness of breath: Secondary | ICD-10-CM | POA: Diagnosis not present

## 2018-04-07 DIAGNOSIS — G4733 Obstructive sleep apnea (adult) (pediatric): Secondary | ICD-10-CM | POA: Diagnosis not present

## 2018-04-07 LAB — BAYER DCA HB A1C WAIVED: HB A1C: 7.4 % — AB (ref <7.0)

## 2018-04-07 MED ORDER — T.E.D. BELOW KNEE/LARGE MISC: 1.0000 | Freq: Every day | 0 refills | Status: DC

## 2018-04-07 NOTE — Assessment & Plan Note (Addendum)
Chronic, ongoing.  A1C today 7.4%, previous 7.2%.  Continues to poorly follow diabetic diet.  Recheck in 3 months and continue current regimen.  She is going to continue to focus on diet and will readdress next visit need for additional medication.

## 2018-04-07 NOTE — Assessment & Plan Note (Signed)
Chronic, ongoing.  Followed by Dr. Raul Del and last seen today 04/07/18, with Flonase initiated.  Follow-up with Dr. Raul Del in 4 weeks.  Continue to collaborate with provider.

## 2018-04-07 NOTE — Progress Notes (Signed)
BP 140/68 (BP Location: Left Arm)   Pulse 85   Temp 98.7 F (37.1 C) (Oral)   Ht 5\' 1"  (1.549 m)   Wt 180 lb (81.6 kg)   LMP  (LMP Unknown)   SpO2 97%   BMI 34.01 kg/m    Subjective:    Patient ID: Tina Mcdonald, female    DOB: Sep 16, 1940, 77 y.o.   MRN: [id number]  HPI: Tina Mcdonald is a 77 y.o. female for follow-up visit.  Chief Complaint  Patient presents with  . Diabetes    f/u  . Hypertension  . Hyperlipidemia  . Shortness of Breath    pt states she had a breathing test today at the [facility]   DIABETES Hypoglycemic episodes:no Polydipsia/polyuria: no Visual disturbance: no Chest pain: no Paresthesias: no Glucose Monitoring: yes  Accucheck frequency: when feels sugar is up  Fasting glucose:  Post prandial:  Evening:  Before meals: Taking Insulin?: no  Long acting insulin:  Short acting insulin: Blood Pressure Monitoring: not checking Retinal Examination: Up to Date Foot Exam: Up to Date Diabetic Education: Completed Pneumovax: Up to Date Influenza: Not up to Date, she reports it "always" makes her sick and does not get it Aspirin: no  She reports she only checks her sugar when it is elevated, so when she checks it is often in 200-300 range.  Does not check it any other time.  HYPERTENSION / HYPERLIPIDEMIA Satisfied with current treatment? yes Duration of hypertension: chronic BP monitoring frequency: not checking BP range:  BP medication side effects: no Past BP meds: none Duration of hyperlipidemia: chronic Cholesterol medication side effects: no Cholesterol supplements: none Past cholesterol medications: none Medication compliance: excellent compliance Aspirin: no Recent stressors: no Recurrent headaches: no Visual changes: no Palpitations: no Dyspnea: no Chest pain: no Lower extremity edema: yes Dizzy/lightheaded: no Sees Dr. Clayborn Bigness, cardiology, and last saw 03/03/18.  She reports seeing Dr. Raul Del this morning and  reports concern over edema in BLE.  States that Dr. Raul Del told her to get RLE looked at.  States that RLE is "bigger" than LLE and has bruising and tenderness to touch.  Denies any recent injury or trauma to leg.  SOB: Had appointment with Dr. Raul Del for SOB and pulmonary testing done.  Flonase ordered.  Sees Dr. Raul Del next in 4 weeks for follow-up.  Relevant past medical, surgical, family and social history reviewed and updated as indicated. Interim medical history since our last visit reviewed. Allergies and medications reviewed and updated.  Review of Systems  Per HPI unless specifically indicated above     Objective:    BP 140/68 (BP Location: Left Arm)   Pulse 85   Temp 98.7 F (37.1 C) (Oral)   Ht 5\' 1"  (1.549 m)   Wt 180 lb (81.6 kg)   LMP  (LMP Unknown)   SpO2 97%   BMI 34.01 kg/m   Wt Readings from Last 3 Encounters:  04/07/18 180 lb (81.6 kg)  03/04/18 178 lb 12.8 oz (81.1 kg)  12/08/17 173 lb 1.6 oz (78.5 kg)    Physical Exam  Constitutional: She is oriented to person, place, and time. She appears well-developed and well-nourished.  HENT:  Head: Normocephalic and atraumatic.  Mouth/Throat: Oropharynx is clear and moist.  Eyes: Pupils are equal, round, and reactive to light.  Neck: Normal range of motion.  Cardiovascular: Normal rate, regular rhythm, normal heart sounds and intact distal pulses.  Pulmonary/Chest: Effort normal and breath sounds normal. No  accessory muscle usage. No respiratory distress.  Abdominal: Soft. Bowel sounds are normal.  Musculoskeletal: Normal range of motion.       Right lower leg: She exhibits edema.       Left lower leg: She exhibits edema.  RLE edema slightly greater than LLE.  Small scattered pale purple bruises to x 2 mid-lateral aspect shin and x 2 pale purple medial aspect.  No erythema or warmth to extremity.  Mild tenderness to shin area.  Normal ROM BLE with no pain reported.  Neurological: She is alert and oriented to  person, place, and time.  Skin: Skin is warm and dry.  Psychiatric: She has a normal mood and affect. Her behavior is normal.      Results for orders placed or performed in visit on 10/28/17  Comprehensive metabolic panel  Result Value Ref Range   Glucose 122 (H) 65 - 99 mg/dL   BUN 19 8 - 27 mg/dL   Creatinine, Ser 0.98 0.57 - 1.00 mg/dL   GFR calc non Af Amer 57 (L) >59 mL/min/1.73   GFR calc Af Amer 65 >59 mL/min/1.73   BUN/Creatinine Ratio 19 12 - 28   Sodium 139 134 - 144 mmol/L   Potassium 3.9 3.5 - 5.2 mmol/L   Chloride 99 96 - 106 mmol/L   CO2 26 20 - 29 mmol/L   Calcium 10.1 8.7 - 10.3 mg/dL   Total Protein 6.8 6.0 - 8.5 g/dL   Albumin 4.1 3.5 - 4.8 g/dL   Globulin, Total 2.7 1.5 - 4.5 g/dL   Albumin/Globulin Ratio 1.5 1.2 - 2.2   Bilirubin Total 0.3 0.0 - 1.2 mg/dL   Alkaline Phosphatase 68 39 - 117 IU/L   AST 13 0 - 40 IU/L   ALT 13 0 - 32 IU/L  Bayer DCA Hb A1c Waived  Result Value Ref Range   HB A1C (BAYER DCA - WAIVED) 7.2 (H) <7.0 %  Lipid Panel w/o Chol/HDL Ratio  Result Value Ref Range   Cholesterol, Total 182 100 - 199 mg/dL   Triglycerides 104 0 - 149 mg/dL   HDL 50 >39 mg/dL   VLDL Cholesterol Cal 21 5 - 40 mg/dL   LDL Calculated 111 (H) 0 - 99 mg/dL      Assessment & Plan:   Problem List Items Addressed This Visit      Cardiovascular and Mediastinum   Hypertension    Chronic, ongoing.  Not at goal.  Feels it is due to her anxiety recently over her health.  Continue to monitor and collaborate with Dr. Clayborn Bigness.  Adjust HCTZ as needed and initiate additional medications if needs for BP.  Continue to collaborate with cardiology.        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  A1C today 7.4%, previous 7.2%.  Continues to poorly follow diabetic diet.  Recheck in 3 months and continue current regimen.  She is going to continue to focus on diet and will readdress next visit need for  additional medication.        Other   Anemia   Vitamin B12 deficiency   Relevant Orders   Bayer DCA Hb A1c Waived (STAT)   Hyperlipidemia    Chronic, stable.  Lipid panel at next visit.      SOB (shortness of breath)    Chronic, ongoing.  Followed by Dr. Raul Del and last seen today 04/07/18, with Flonase initiated.  Follow-up  with Dr. Raul Del in 4 weeks.  Continue to collaborate with provider.      Edema    Acute with increase RLE>LLE and presenting with bruising to areas unknown origin and mild tenderness to touch.  Doppler venous u/s ordered for [facility]. Order for TED hose to place on every morning and off every evening.      Relevant Orders   VAS [place] LOWER EXTREMITY VENOUS (DVT)       Follow up plan: Return in about 3 months (around 07/08/2018), or if symptoms worsen or fail to improve, for HTN and HLD + T2DM.

## 2018-04-07 NOTE — Assessment & Plan Note (Addendum)
Acute with increase RLE>LLE and presenting with bruising to areas unknown origin and mild tenderness to touch.  Doppler venous u/s ordered for [facility]. Order for TED hose to place on every morning and off every evening.

## 2018-04-07 NOTE — Assessment & Plan Note (Signed)
Chronic, stable.  Lipid panel at next visit.

## 2018-04-07 NOTE — Patient Instructions (Signed)
Edema Edema is when you have too much fluid in your body or under your skin. Edema may make your legs, feet, and ankles swell up. Swelling is also common in looser tissues, like around your eyes. This is a common condition. It gets more common as you get older. There are many possible causes of edema. Eating too much salt (sodium) and being on your feet or sitting for a long time can cause edema in your legs, feet, and ankles. Hot weather may make edema worse. Edema is usually painless. Your skin may look swollen or shiny. Follow these instructions at home:  Keep the swollen body part raised (elevated) above the level of your heart when you are sitting or lying down.  Do not sit still or stand for a long time.  Do not wear tight clothes. Do not wear garters on your upper legs.  Exercise your legs. This can help the swelling go down.  Wear elastic bandages or support stockings as told by your doctor.  Eat a low-salt (low-sodium) diet to reduce fluid as told by your doctor.  Depending on the cause of your swelling, you may need to limit how much fluid you drink (fluid restriction).  Take over-the-counter and prescription medicines only as told by your doctor. Contact a doctor if:  Treatment is not working.  You have heart, liver, or kidney disease and have symptoms of edema.  You have sudden and unexplained weight gain. Get help right away if:  You have shortness of breath or chest pain.  You cannot breathe when you lie down.  You have pain, redness, or warmth in the swollen areas.  You have heart, liver, or kidney disease and get edema all of a sudden.  You have a fever and your symptoms get worse all of a sudden. Summary  Edema is when you have too much fluid in your body or under your skin.  Edema may make your legs, feet, and ankles swell up. Swelling is also common in looser tissues, like around your eyes.  Raise (elevate) the swollen body part above the level of your  heart when you are sitting or lying down.  Follow your doctor's instructions about diet and how much fluid you can drink (fluid restriction). This information is not intended to replace advice given to you by your health care provider. Make sure you discuss any questions you have with your health care provider. Document Released: 11/26/2007 Document Revised: 06/27/2016 Document Reviewed: 06/27/2016 Elsevier Interactive Patient Education  2017 [employer]. Diabetes Mellitus and Nutrition When you have diabetes (diabetes mellitus), it is very important to have healthy eating habits because your blood sugar (glucose) levels are greatly affected by what you eat and drink. Eating healthy foods in the appropriate amounts, at about the same times every day, can help you:  Control your blood glucose.  Lower your risk of heart disease.  Improve your blood pressure.  Reach or maintain a healthy weight.  Every person with diabetes is different, and each person has different needs for a meal plan. Your health care provider may recommend that you work with a diet and nutrition specialist (dietitian) to make a meal plan that is best for you. Your meal plan may vary depending on factors such as:  The calories you need.  The medicines you take.  Your weight.  Your blood glucose, blood pressure, and cholesterol levels.  Your activity level.  Other health conditions you have, such as heart or kidney disease.  How do carbohydrates affect me? Carbohydrates affect your blood glucose level more than any other type of food. Eating carbohydrates naturally increases the amount of glucose in your blood. Carbohydrate counting is a method for keeping track of how many carbohydrates you eat. Counting carbohydrates is important to keep your blood glucose at a healthy level, especially if you use insulin or take certain oral diabetes medicines. It is important to know how many carbohydrates you can safely have  in each meal. This is different for every person. Your dietitian can help you calculate how many carbohydrates you should have at each meal and for snack. Foods that contain carbohydrates include:  Bread, cereal, rice, pasta, and crackers.  Potatoes and corn.  Peas, beans, and lentils.  Milk and yogurt.  Fruit and juice.  Desserts, such as cakes, cookies, ice cream, and candy.  How does alcohol affect me? Alcohol can cause a sudden decrease in blood glucose (hypoglycemia), especially if you use insulin or take certain oral diabetes medicines. Hypoglycemia can be a life-threatening condition. Symptoms of hypoglycemia (sleepiness, dizziness, and confusion) are similar to symptoms of having too much alcohol. If your health care provider says that alcohol is safe for you, follow these guidelines:  Limit alcohol intake to no more than 1 drink per day for nonpregnant women and 2 drinks per day for men. One drink equals 12 oz of beer, 5 oz of wine, or 1 oz of hard liquor.  Do not drink on an empty stomach.  Keep yourself hydrated with water, diet soda, or unsweetened iced tea.  Keep in mind that regular soda, juice, and other mixers may contain a lot of sugar and must be counted as carbohydrates.  What are tips for following this plan? Reading food labels  Start by checking the serving size on the label. The amount of calories, carbohydrates, fats, and other nutrients listed on the label are based on one serving of the food. Many foods contain more than one serving per package.  Check the total grams (g) of carbohydrates in one serving. You can calculate the number of servings of carbohydrates in one serving by dividing the total carbohydrates by 15. For example, if a food has 30 g of total carbohydrates, it would be equal to 2 servings of carbohydrates.  Check the number of grams (g) of saturated and trans fats in one serving. Choose foods that have low or no amount of these  fats.  Check the number of milligrams (mg) of sodium in one serving. Most people should limit total sodium intake to less than 2,300 mg per day.  Always check the nutrition information of foods labeled as "low-fat" or "nonfat". These foods may be higher in added sugar or refined carbohydrates and should be avoided.  Talk to your dietitian to identify your daily goals for nutrients listed on the label. Shopping  Avoid buying canned, premade, or processed foods. These foods tend to be high in fat, sodium, and added sugar.  Shop around the outside edge of the grocery store. This includes fresh fruits and vegetables, bulk grains, fresh meats, and fresh dairy. Cooking  Use low-heat cooking methods, such as baking, instead of high-heat cooking methods like deep frying.  Cook using healthy oils, such as olive, canola, or sunflower oil.  Avoid cooking with butter, cream, or high-fat meats. Meal planning  Eat meals and snacks regularly, preferably at the same times every day. Avoid going long periods of time without eating.  Eat foods high in  fiber, such as fresh fruits, vegetables, beans, and whole grains. Talk to your dietitian about how many servings of carbohydrates you can eat at each meal.  Eat 4-6 ounces of lean protein each day, such as lean meat, chicken, fish, eggs, or tofu. 1 ounce is equal to 1 ounce of meat, chicken, or fish, 1 egg, or 1/4 cup of tofu.  Eat some foods each day that contain healthy fats, such as avocado, nuts, seeds, and fish. Lifestyle   Check your blood glucose regularly.  Exercise at least 30 minutes 5 or more days each week, or as told by your health care provider.  Take medicines as told by your health care provider.  Do not use any products that contain nicotine or tobacco, such as cigarettes and e-cigarettes. If you need help quitting, ask your health care provider.  Work with a Social worker or diabetes educator to identify strategies to manage stress  and any emotional and social challenges. What are some questions to ask my health care provider?  Do I need to meet with a diabetes educator?  Do I need to meet with a dietitian?  What number can I call if I have questions?  When are the best times to check my blood glucose? Where to find more information:  American Diabetes Association: diabetes.org/food-and-fitness/food  Academy of Nutrition and Dietetics: PokerClues.dk  [employer] of Diabetes and Digestive and Kidney Diseases (NIH): ContactWire.be Summary  A healthy meal plan will help you control your blood glucose and maintain a healthy lifestyle.  Working with a diet and nutrition specialist (dietitian) can help you make a meal plan that is best for you.  Keep in mind that carbohydrates and alcohol have immediate effects on your blood glucose levels. It is important to count carbohydrates and to use alcohol carefully. This information is not intended to replace advice given to you by your health care provider. Make sure you discuss any questions you have with your health care provider. Document Released: 03/06/2005 Document Revised: 07/14/2016 Document Reviewed: 07/14/2016 Elsevier Interactive Patient Education  [employer].

## 2018-04-07 NOTE — Assessment & Plan Note (Signed)
Chronic, ongoing.  Not at goal.  Feels it is due to her anxiety recently over her health.  Continue to monitor and collaborate with Dr. Clayborn Bigness.  Adjust HCTZ as needed and initiate additional medications if needs for BP.  Continue to collaborate with cardiology.

## 2018-04-08 ENCOUNTER — Other Ambulatory Visit: Payer: Self-pay | Admitting: Specialist

## 2018-04-08 DIAGNOSIS — R1312 Dysphagia, oropharyngeal phase: Secondary | ICD-10-CM

## 2018-04-14 ENCOUNTER — Ambulatory Visit
Admission: RE | Admit: 2018-04-14 | Discharge: 2018-04-14 | Disposition: A | Discharge status: Home / Self Care | Payer: Medicare Other | Source: Ambulatory Visit | Attending: Specialist | Admitting: Specialist

## 2018-04-14 DIAGNOSIS — N189 Chronic kidney disease, unspecified: Secondary | ICD-10-CM | POA: Insufficient documentation

## 2018-04-14 DIAGNOSIS — I129 Hypertensive chronic kidney disease with stage 1 through stage 4 chronic kidney disease, or unspecified chronic kidney disease: Secondary | ICD-10-CM | POA: Insufficient documentation

## 2018-04-14 DIAGNOSIS — E1122 Type 2 diabetes mellitus with diabetic chronic kidney disease: Secondary | ICD-10-CM | POA: Insufficient documentation

## 2018-04-14 DIAGNOSIS — R1312 Dysphagia, oropharyngeal phase: Secondary | ICD-10-CM | POA: Insufficient documentation

## 2018-04-14 DIAGNOSIS — E1140 Type 2 diabetes mellitus with diabetic neuropathy, unspecified: Secondary | ICD-10-CM | POA: Insufficient documentation

## 2018-04-14 DIAGNOSIS — Z9889 Other specified postprocedural states: Secondary | ICD-10-CM | POA: Insufficient documentation

## 2018-04-14 DIAGNOSIS — R1310 Dysphagia, unspecified: Secondary | ICD-10-CM | POA: Diagnosis not present

## 2018-04-14 DIAGNOSIS — R05 Cough: Secondary | ICD-10-CM | POA: Diagnosis not present

## 2018-04-14 NOTE — Therapy (Signed)
[facility] [place], [place], [address] Phone: [phone]   Fax:     Modified Barium Swallow  Patient Details  Name: Tina Mcdonald MRN: [id number] Date of Birth: 24-Aug-1940 No data recorded  Encounter Date: 04/14/2018  End of Session - 04/14/18 1303    Visit Number  1    Number of Visits  1    Date for SLP Re-Evaluation  04/14/18       Past Medical History:  Diagnosis Date  . Anemia   . Arthritis   . Chronic kidney disease   . Colon polyps   . Diabetes mellitus without complication ([place])   . Diabetic neuropathy ([place])   . GERD (gastroesophageal reflux disease)   . Hyperlipemia   . Hypertension   . Osteoarthritis   . Radiculopathy   . Sleep apnea    does not have her cpap anymore  . Stress incontinence   . Urticaria   . Vitamin B 12 deficiency   . Wears glasses     Past Surgical History:  Procedure Laterality Date  . ABDOMINAL HYSTERECTOMY    . APPENDECTOMY    . BREAST BIOPSY Right    right-neg  . BREAST BIOPSY Left    us/bx/clip-neg  . CARPAL TUNNEL RELEASE     right  . COLONOSCOPY    . COLONOSCOPY WITH PROPOFOL N/A 12/10/2015   Procedure: COLONOSCOPY WITH PROPOFOL;  Surgeon: Lucilla Lame, MD;  Location: [facility];  Service: Endoscopy;  Laterality: N/A;  Diabetic - oral meds Sleep Apnea LATEX allergy  . CYSTOSCOPY    . DILATION AND CURETTAGE OF UTERUS    . EYE SURGERY Bilateral    lens implant  . LUMBAR FUSION  2009  . POLYPECTOMY  12/10/2015   Procedure: POLYPECTOMY;  Surgeon: Lucilla Lame, MD;  Location: [facility];  Service: Endoscopy;;  . RESECTION DISTAL CLAVICAL Right 11/25/2013   Procedure: RIGHT SHOULDER OPEN RESECTION DISTAL CLAVICAL EXCISION SOFT TISSUE TUMOR SHOULDER DEEP SUBFASCIAL INTRAMUSCULAR/DEBRIDEMENT/ROTATOR CUFF REPAIR/BICEPS TENODESIS/MANIPULATION;  Surgeon: Renette Butters, MD;  Location: [facility];  Service: Orthopedics;   Laterality: Right;  . SHOULDER ARTHROSCOPY WITH ROTATOR CUFF REPAIR AND SUBACROMIAL DECOMPRESSION Right 11/25/2013   Procedure: SHOULDER ARTHROSCOPY WITH DEBRIDEMENT ROTATOR CUFF REPAIR  A;  Surgeon: Renette Butters, MD;  Location: [facility];  Service: Orthopedics;  Laterality: Right;    There were no vitals filed for this visit.  Subjective: Patient behavior: (alertness, ability to follow instructions, etc.): The patient is alert, able to verbalize her swallowing complaints, and follow directions  Chief complaint: The patient reports cough with eating and drinking but denies subjective sense that she is aspirating    Objective:  Radiological Procedure: A videoflouroscopic evaluation of oral-preparatory, reflex initiation, and pharyngeal phases of the swallow was performed; as well as a screening of the upper esophageal phase.  I. POSTURE: Upright in MBS chair  II. VIEW: Lateral  III. COMPENSATORY STRATEGIES: N/A  IV. BOLUSES ADMINISTERED:   Thin Liquid: 1 small, 3 rapid consecutive   Nectar-thick Liquid: 1 moderate   Honey-thick Liquid: DNT   Puree: 2 teaspoon presentations   Mechanical Soft: 1/4 graham cracker in applesauce  V. RESULTS OF EVALUATION: A. ORAL PREPARATORY PHASE: (The lips, tongue, and velum are observed for strength and coordination)       **Overall Severity Rating: within normal limits   B. SWALLOW INITIATION/REFLEX: (The reflex is normal if "triggered" by the time  the bolus reached the base of the tongue)  **Overall Severity Rating: Mild; triggers while falling from the valleculae to the pyriform sinuses  C. PHARYNGEAL PHASE: (Pharyngeal function is normal if the bolus shows rapid, smooth, and continuous transit through the pharynx and there is no pharyngeal residue after the swallow)  **Overall Severity Rating: within normal limits   D. LARYNGEAL PENETRATION: (Material entering into the laryngeal inlet/vestibule but not aspirated)  NONE  E. ASPIRATION: NONE  F. ESOPHAGEAL PHASE: (Screening of the upper esophagus) No observed abnormality within the viewable cervical esophagus  ASSESSMENT: This 77 year old woman; with cough and question whether aspiration is contributing to the cough; is presenting with minimal oropharyngeal dysphagia characterized by delayed pharyngeal swallow initiation.  Oral control of the bolus including oral hold, rotary mastication, and anterior to posterior transfer is within normal limits.  Aspects of the pharyngeal stage of swallowing including tongue base retraction, hyolaryngeal excursion, epiglottic inversion, and duration/amplitude of UES opening are within normal limits.  There is no pharyngeal residue, laryngeal penetration, or tracheal aspiration.  The patient's complaints do not appear to be due to oropharyngeal swallow function.    PLAN/RECOMMENDATIONS:   A. Diet: Regular   B. Swallowing Precautions: No special precautions required based on this study   C. Recommended consultation to: GI, ENT   D. Therapy recommendations: speech therapy is not indicated   E. Results and recommendations were discussed with the patient immediately following the study and the final report routed to the referring MD.   Oropharyngeal dysphagia - Plan: DG OP Swallowing Func-Medicare/Speech Path, DG OP Swallowing Func-Medicare/Speech Path        Problem List Patient Active Problem List   Diagnosis Date Noted  . Edema 04/07/2018  . SOB (shortness of breath) 10/28/2017  . Myalgia 02/24/2017  . Sleep apnea 02/24/2017  . Rotator cuff tendonitis, left 02/24/2017  . Urge incontinence 11/24/2016  . Benign neoplasm of sigmoid colon   . Cough 10/17/2015  . CKD (chronic kidney disease) 02/20/2015  . Colon polyps 01/16/2015  . Anemia 01/16/2015  . Vitamin B12 deficiency 01/16/2015  . Radiculopathy 01/16/2015  . Stress incontinence 01/16/2015  . Urticaria 01/16/2015  . Uncontrolled type 2 diabetes  mellitus with chronic kidney disease ([place]) 01/16/2015  . Osteoarthritis of both knees 01/16/2015  . Hypertension 01/16/2015  . Hyperlipidemia 01/16/2015  . Diabetic neuropathy ([place]) 01/16/2015  . Hypertensive CKD (chronic kidney disease) 01/16/2015  . GERD (gastroesophageal reflux disease) 01/16/2015   Leroy Sea, MS/CCC- SLP  Lou Miner 04/14/2018, 1:03 PM  [facility] [place], [place], [address] Phone: [phone]   Fax:     Name: Tina Mcdonald MRN: [id number] Date of Birth: 22-Jun-1941

## 2018-05-05 DIAGNOSIS — R05 Cough: Secondary | ICD-10-CM | POA: Diagnosis not present

## 2018-05-05 DIAGNOSIS — E113393 Type 2 diabetes mellitus with moderate nonproliferative diabetic retinopathy without macular edema, bilateral: Secondary | ICD-10-CM | POA: Diagnosis not present

## 2018-05-05 DIAGNOSIS — Z961 Presence of intraocular lens: Secondary | ICD-10-CM | POA: Diagnosis not present

## 2018-05-05 DIAGNOSIS — R0609 Other forms of dyspnea: Secondary | ICD-10-CM | POA: Diagnosis not present

## 2018-05-05 DIAGNOSIS — J9801 Acute bronchospasm: Secondary | ICD-10-CM | POA: Diagnosis not present

## 2018-05-14 DIAGNOSIS — Z961 Presence of intraocular lens: Secondary | ICD-10-CM | POA: Diagnosis not present

## 2018-05-14 DIAGNOSIS — H26491 Other secondary cataract, right eye: Secondary | ICD-10-CM | POA: Diagnosis not present

## 2018-05-21 ENCOUNTER — Other Ambulatory Visit: Payer: Self-pay | Admitting: Physician Assistant

## 2018-05-21 ENCOUNTER — Other Ambulatory Visit: Payer: Self-pay | Admitting: Unknown Physician Specialty

## 2018-05-28 DIAGNOSIS — H26492 Other secondary cataract, left eye: Secondary | ICD-10-CM | POA: Diagnosis not present

## 2018-06-14 ENCOUNTER — Ambulatory Visit: Payer: Medicare Other

## 2018-06-14 DIAGNOSIS — E538 Deficiency of other specified B group vitamins: Secondary | ICD-10-CM

## 2018-07-08 ENCOUNTER — Encounter: Payer: Self-pay | Admitting: Nurse Practitioner

## 2018-07-09 ENCOUNTER — Ambulatory Visit: Payer: Medicare Other | Admitting: Nurse Practitioner

## 2018-07-14 ENCOUNTER — Ambulatory Visit: Payer: Medicare Other | Admitting: Nurse Practitioner

## 2018-08-05 DIAGNOSIS — G4733 Obstructive sleep apnea (adult) (pediatric): Secondary | ICD-10-CM | POA: Diagnosis not present

## 2018-08-05 DIAGNOSIS — Z87898 Personal history of other specified conditions: Secondary | ICD-10-CM | POA: Diagnosis not present

## 2018-08-05 DIAGNOSIS — R05 Cough: Secondary | ICD-10-CM | POA: Diagnosis not present

## 2018-08-11 ENCOUNTER — Ambulatory Visit (INDEPENDENT_AMBULATORY_CARE_PROVIDER_SITE_OTHER): Payer: Medicare Other | Admitting: Nurse Practitioner

## 2018-08-11 ENCOUNTER — Encounter: Payer: Self-pay | Admitting: Nurse Practitioner

## 2018-08-11 ENCOUNTER — Other Ambulatory Visit: Payer: Self-pay

## 2018-08-11 VITALS — BP 127/77 | HR 78 | Temp 98.5°F | Ht 61.0 in | Wt 178.0 lb

## 2018-08-11 DIAGNOSIS — M150 Primary generalized (osteo)arthritis: Secondary | ICD-10-CM

## 2018-08-11 DIAGNOSIS — E1165 Type 2 diabetes mellitus with hyperglycemia: Secondary | ICD-10-CM | POA: Diagnosis not present

## 2018-08-11 DIAGNOSIS — K219 Gastro-esophageal reflux disease without esophagitis: Secondary | ICD-10-CM

## 2018-08-11 DIAGNOSIS — M159 Polyosteoarthritis, unspecified: Secondary | ICD-10-CM

## 2018-08-11 DIAGNOSIS — E538 Deficiency of other specified B group vitamins: Secondary | ICD-10-CM | POA: Diagnosis not present

## 2018-08-11 DIAGNOSIS — N183 Chronic kidney disease, stage 3 (moderate): Secondary | ICD-10-CM | POA: Diagnosis not present

## 2018-08-11 DIAGNOSIS — E1122 Type 2 diabetes mellitus with diabetic chronic kidney disease: Secondary | ICD-10-CM

## 2018-08-11 DIAGNOSIS — I1310 Hypertensive heart and chronic kidney disease without heart failure, with stage 1 through stage 4 chronic kidney disease, or unspecified chronic kidney disease: Secondary | ICD-10-CM | POA: Diagnosis not present

## 2018-08-11 DIAGNOSIS — IMO0002 Reserved for concepts with insufficient information to code with codable children: Secondary | ICD-10-CM

## 2018-08-11 DIAGNOSIS — E1169 Type 2 diabetes mellitus with other specified complication: Secondary | ICD-10-CM

## 2018-08-11 DIAGNOSIS — E785 Hyperlipidemia, unspecified: Secondary | ICD-10-CM

## 2018-08-11 LAB — BAYER DCA HB A1C WAIVED: HB A1C (BAYER DCA - WAIVED): 7.4 % — ABNORMAL HIGH (ref <7.0)

## 2018-08-11 MED ORDER — DICLOFENAC SODIUM 1 % TD GEL: 4.0000 g | TRANSDERMAL | 2 refills | Status: DC | PRN

## 2018-08-11 NOTE — Progress Notes (Addendum)
BP 127/77   Pulse 78   Temp 98.5 F (36.9 C) (Oral)   Ht 5\' 1"  (1.549 m)   Wt 178 lb (80.7 kg)   LMP  (LMP Unknown)   SpO2 99%   BMI 33.63 kg/m    Subjective:    Patient ID: Tina Mcdonald, female    DOB: Jan 27, 1941, 78 y.o.   MRN: [id number]  HPI: Tina Mcdonald is a 78 y.o. female  Chief Complaint  Patient presents with  . Diabetes    28m f/u  . Hypertension  . Hyperlipidemia   GERD: Pt reports increased cough with mucus production for approximately 2 months. She reports "choking" and coughing after eating and drinking and occasionally waking her up from her sleep. She reports this has happened in the spring of 2019 and a barium swallow was performed, which she reports showed she was aspirating (Barium swallow report from 03/2018 shows delayed esophageal emptying). Pt reports these symptoms resolved spontaneously last time without intervention. She denies any changes in medication or diet. She denies reflux or heartburn symptoms. She denies changes in her vision, weakness, headaches, or slurred speech. She is taking omeprazole daily. She reports seeing pulmonology and cardiology for the cough symptoms without resolution. She continues to have intermittent ongoing symptoms.  Her last colonoscopy was in 2017.  DIABETES Pt takes metformin. HgbA1c 7.4% today, no change from previous.  She endorses not always maintaining diabetic diet. Hypoglycemic episodes:no Polydipsia/polyuria: no Visual disturbance: no Chest pain: no Paresthesias: no Glucose Monitoring: no  Taking Insulin?: no Blood Pressure Monitoring: not checking Retinal Examination: Up to Date Foot Exam: performed today Diabetic Education: done Pneumovax: Up to Date Influenza: Up to Date Aspirin: no   HYPERTENSION / HYPERLIPIDEMIA Pt currently taking metoprolol succinate, hydrochlorothiazide, and clonidine for HTN and atorvastatin for HLD.  Satisfied with current treatment? yes Duration of hypertension:  chronic BP monitoring frequency: not checking BP medication side effects: no Duration of hyperlipidemia: chronic Cholesterol medication side effects: no Cholesterol supplements: none Medication compliance: good compliance Aspirin: no Recent stressors: no Recurrent headaches: no Visual changes: no Palpitations: no Dyspnea: no Chest pain: no Lower extremity edema: yes Dizzy/lightheaded: no   Relevant past medical, surgical, family and social history reviewed and updated as indicated. Interim medical history since our last visit reviewed. Allergies and medications reviewed and updated.  Review of Systems  Per HPI unless specifically indicated above     Objective:    BP 127/77   Pulse 78   Temp 98.5 F (36.9 C) (Oral)   Ht 5\' 1"  (1.549 m)   Wt 178 lb (80.7 kg)   LMP  (LMP Unknown)   SpO2 99%   BMI 33.63 kg/m   Wt Readings from Last 3 Encounters:  08/11/18 178 lb (80.7 kg)  04/07/18 180 lb (81.6 kg)  03/04/18 178 lb 12.8 oz (81.1 kg)    Physical Exam Vitals signs and nursing note reviewed.  Constitutional:      Appearance: She is well-developed.  HENT:     Head: Normocephalic.  Eyes:     General:        Right eye: No discharge.        Left eye: No discharge.     Conjunctiva/sclera: Conjunctivae normal.     Pupils: Pupils are equal, round, and reactive to light.  Neck:     Musculoskeletal: Normal range of motion and neck supple.     Thyroid: No thyromegaly.     Vascular: No carotid  bruit or JVD.  Cardiovascular:     Rate and Rhythm: Normal rate and regular rhythm.     Heart sounds: Normal heart sounds.  Pulmonary:     Effort: Pulmonary effort is normal.     Breath sounds: Normal breath sounds.  Abdominal:     General: Bowel sounds are normal.     Palpations: Abdomen is soft.  Lymphadenopathy:     Cervical: No cervical adenopathy.  Skin:    General: Skin is warm and dry.  Neurological:     Mental Status: She is alert and oriented to person, place, and  time.  Psychiatric:        Behavior: Behavior normal.        Thought Content: Thought content normal.        Judgment: Judgment normal.    Diabetic Foot Exam - Simple   Simple Foot Form Diabetic Foot exam was performed with the following findings:  Yes 08/11/2018  4:40 PM  Visual Inspection No deformities, no ulcerations, no other skin breakdown bilaterally:  Yes Sensation Testing Intact to touch and monofilament testing bilaterally:  Yes Pulse Check Posterior Tibialis and Dorsalis pulse intact bilaterally:  Yes Comments     Results for orders placed or performed in visit on 08/11/18  Bayer DCA Hb A1c Waived  Result Value Ref Range   HB A1C (BAYER DCA - WAIVED) 7.4 (H) <7.0 %      Assessment & Plan:   Problem List Items Addressed This Visit      Cardiovascular and Mediastinum   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[place]) - Primary    Chronic, ongoing. Well controlled. Continue current medication as prescribed. Follow-up in appx 3 months.       Relevant Orders   Comprehensive metabolic panel     Digestive   GERD (gastroesophageal reflux disease)    Chronic, not well controlled. Referral to GI. Continue omeprazole. Educated on double swallow, chin tuck, bite size portions, and food diary with symptoms.       Relevant Orders   Ambulatory referral to Gastroenterology     Endocrine   Uncontrolled type 2 diabetes mellitus with chronic kidney disease (H[facility])    Chronic, ongoing. HbA1c 7.4% today. Continue current medication regimen.  Focus on diet changes and exercise.  Recommend daily CBG monitoring. Follow-up in 3 months.  Consider addition of SGLT at next visit if continued elevation in A1C.      Relevant Orders   Bayer DCA Hb A1c Waived (Completed)   Hyperlipidemia associated with type 2 diabetes mellitus ([place])    Lipid panel today. Will assess need for medication adjustment based on lab results. Continue current medication regimen.  Follow-up in 3 months      Relevant Orders   Lipid Panel w/o Chol/HDL Ratio     Musculoskeletal and Integument   Osteoarthritis    Ongoing issues with hips and knees.  Recommend not using Aleeve.  Script sent for Diclofenac gel.  If worsening or continued issues return to clinic.  If continued issues at next visit will obtain imaging and discuss PT.          Follow up plan: Return in about 3 months (around 11/09/2018) for T2DM, HLD, HTN.   NOTE WRITTEN BY [employer] DNP STUDENT.  ASSESSMENT AND PLAN OF CARE REVIEWED WITH STUDENT, AGREE WITH ABOVE FINDINGS AND PLAN.

## 2018-08-11 NOTE — Patient Instructions (Addendum)
DASH Eating Plan DASH stands for "Dietary Approaches to Stop Hypertension." The DASH eating plan is a healthy eating plan that has been shown to reduce high blood pressure (hypertension). It may also reduce your risk for type 2 diabetes, heart disease, and stroke. The DASH eating plan may also help with weight loss. What are tips for following this plan?  General guidelines  Avoid eating more than 2,300 mg (milligrams) of salt (sodium) a day. If you have hypertension, you may need to reduce your sodium intake to 1,500 mg a day.  Limit alcohol intake to no more than 1 drink a day for nonpregnant women and 2 drinks a day for men. One drink equals 12 oz of beer, 5 oz of wine, or 1 oz of hard liquor.  Work with your health care provider to maintain a healthy body weight or to lose weight. Ask what an ideal weight is for you.  Get at least 30 minutes of exercise that causes your heart to beat faster (aerobic exercise) most days of the week. Activities may include walking, swimming, or biking.  Work with your health care provider or diet and nutrition specialist (dietitian) to adjust your eating plan to your individual calorie needs. Reading food labels   Check food labels for the amount of sodium per serving. Choose foods with less than 5 percent of the Daily Value of sodium. Generally, foods with less than 300 mg of sodium per serving fit into this eating plan.  To find whole grains, look for the word "whole" as the first word in the ingredient list. Shopping  Buy products labeled as "low-sodium" or "no salt added."  Buy fresh foods. Avoid canned foods and premade or frozen meals. Cooking  Avoid adding salt when cooking. Use salt-free seasonings or herbs instead of table salt or sea salt. Check with your health care provider or pharmacist before using salt substitutes.  Do not fry foods. Cook foods using healthy methods such as baking, boiling, grilling, and broiling instead.  Cook with  heart-healthy oils, such as olive, canola, soybean, or sunflower oil. Meal planning  Eat a balanced diet that includes: ? 5 or more servings of fruits and vegetables each day. At each meal, try to fill half of your plate with fruits and vegetables. ? Up to 6-8 servings of whole grains each day. ? Less than 6 oz of lean meat, poultry, or fish each day. A 3-oz serving of meat is about the same size as a deck of cards. One egg equals 1 oz. ? 2 servings of low-fat dairy each day. ? A serving of nuts, seeds, or beans 5 times each week. ? Heart-healthy fats. Healthy fats called Omega-3 fatty acids are found in foods such as flaxseeds and coldwater fish, like sardines, salmon, and mackerel.  Limit how much you eat of the following: ? Canned or prepackaged foods. ? Food that is high in trans fat, such as fried foods. ? Food that is high in saturated fat, such as fatty meat. ? Sweets, desserts, sugary drinks, and other foods with added sugar. ? Full-fat dairy products.  Do not salt foods before eating.  Try to eat at least 2 vegetarian meals each week.  Eat more home-cooked food and less restaurant, buffet, and fast food.  When eating at a restaurant, ask that your food be prepared with less salt or no salt, if possible. What foods are recommended? The items listed may not be a complete list. Talk with your dietitian about   what dietary choices are best for you. Grains Whole-grain or whole-wheat bread. Whole-grain or whole-wheat pasta. Brown rice. Oatmeal. Quinoa. Bulgur. Whole-grain and low-sodium cereals. Pita bread. Low-fat, low-sodium crackers. Whole-wheat flour tortillas. Vegetables Fresh or frozen vegetables (raw, steamed, roasted, or grilled). Low-sodium or reduced-sodium tomato and vegetable juice. Low-sodium or reduced-sodium tomato sauce and tomato paste. Low-sodium or reduced-sodium canned vegetables. Fruits All fresh, dried, or frozen fruit. Canned fruit in natural juice (without  added sugar). Meat and other protein foods Skinless chicken or turkey. Ground chicken or turkey. Pork with fat trimmed off. Fish and seafood. Egg whites. Dried beans, peas, or lentils. Unsalted nuts, nut butters, and seeds. Unsalted canned beans. Lean cuts of beef with fat trimmed off. Low-sodium, lean deli meat. Dairy Low-fat (1%) or fat-free (skim) milk. Fat-free, low-fat, or reduced-fat cheeses. Nonfat, low-sodium ricotta or cottage cheese. Low-fat or nonfat yogurt. Low-fat, low-sodium cheese. Fats and oils Soft margarine without trans fats. Vegetable oil. Low-fat, reduced-fat, or light mayonnaise and salad dressings (reduced-sodium). Canola, safflower, olive, soybean, and sunflower oils. Avocado. Seasoning and other foods Herbs. Spices. Seasoning mixes without salt. Unsalted popcorn and pretzels. Fat-free sweets. What foods are not recommended? The items listed may not be a complete list. Talk with your dietitian about what dietary choices are best for you. Grains Baked goods made with fat, such as croissants, muffins, or some breads. Dry pasta or rice meal packs. Vegetables Creamed or fried vegetables. Vegetables in a cheese sauce. Regular canned vegetables (not low-sodium or reduced-sodium). Regular canned tomato sauce and paste (not low-sodium or reduced-sodium). Regular tomato and vegetable juice (not low-sodium or reduced-sodium). Pickles. Olives. Fruits Canned fruit in a light or heavy syrup. Fried fruit. Fruit in cream or butter sauce. Meat and other protein foods Fatty cuts of meat. Ribs. Fried meat. Bacon. Sausage. Bologna and other processed lunch meats. Salami. Fatback. Hotdogs. Bratwurst. Salted nuts and seeds. Canned beans with added salt. Canned or smoked fish. Whole eggs or egg yolks. Chicken or turkey with skin. Dairy Whole or 2% milk, cream, and half-and-half. Whole or full-fat cream cheese. Whole-fat or sweetened yogurt. Full-fat cheese. Nondairy creamers. Whipped toppings.  Processed cheese and cheese spreads. Fats and oils Butter. Stick margarine. Lard. Shortening. Ghee. Bacon fat. Tropical oils, such as coconut, palm kernel, or palm oil. Seasoning and other foods Salted popcorn and pretzels. Onion salt, garlic salt, seasoned salt, table salt, and sea salt. Worcestershire sauce. Tartar sauce. Barbecue sauce. Teriyaki sauce. Soy sauce, including reduced-sodium. Steak sauce. Canned and packaged gravies. Fish sauce. Oyster sauce. Cocktail sauce. Horseradish that you find on the shelf. Ketchup. Mustard. Meat flavorings and tenderizers. Bouillon cubes. Hot sauce and Tabasco sauce. Premade or packaged marinades. Premade or packaged taco seasonings. Relishes. Regular salad dressings. Where to find more information:  National Heart, Lung, and Blood Institute: www.nhlbi.nih.gov  American Heart Association: www.heart.org Summary  The DASH eating plan is a healthy eating plan that has been shown to reduce high blood pressure (hypertension). It may also reduce your risk for type 2 diabetes, heart disease, and stroke.  With the DASH eating plan, you should limit salt (sodium) intake to 2,300 mg a day. If you have hypertension, you may need to reduce your sodium intake to 1,500 mg a day.  When on the DASH eating plan, aim to eat more fresh fruits and vegetables, whole grains, lean proteins, low-fat dairy, and heart-healthy fats.  Work with your health care provider or diet and nutrition specialist (dietitian) to adjust your eating plan to your   individual calorie needs. This information is not intended to replace advice given to you by your health care provider. Make sure you discuss any questions you have with your health care provider. Document Released: 05/29/2011 Document Revised: 06/02/2016 Document Reviewed: 06/02/2016 Elsevier Interactive Patient Education  2019 [employer] Inc.  Dysphagia Eating Plan, Bite Size Food This diet plan is for people with moderate swallowing  problems who have transitioned from pureed and minced foods. Bite size foods are soft and cut into small chunks so that they can be swallowed safely. On this eating plan, you may be instructed to drink liquids that are thickened. Work with your health care provider and your diet and nutrition specialist (dietitian) to make sure that you are following the diet safely and getting all the nutrients you need. What are tips for following this plan? General guidelines for foods   You may eat foods that are tender, soft, and moist.  Always test food texture before taking a bite. Poke food with a fork or spoon to make sure it is tender.  Food should be easy to cut and shew. Avoid large pieces of food that require a lot of chewing.  Take small bites. Each bite should be smaller than your thumb nail (about 74mm by 15 mm).  If you were on pureed and minced food diet plans, you may eat any of the foods included in those diets.  Avoid foods that are very dry, hard, sticky, chewy, coarse, or crunchy.  If instructed by your health care provider, thicken liquids. Follow your health care provider's instructions for what products to use, how to do this, and to what thickness. ? Your health care provider may recommend using a commercial thickener, rice cereal, or potato flakes. Ask your health care provider to recommend thickeners. ? Thickened liquids are usually a "pudding-like" consistency, or they may be as thick as honey or thick enough to eat with a spoon. Cooking  To moisten foods, you may add liquids while you are blending, mashing, or grinding your foods to the right consistency. These liquids include gravies, sauces, vegetable or fruit juice, milk, half and half, or water.  Strain extra liquid from foods before eating.  Reheat foods slowly to prevent a tough crust from forming.  Prepare foods in advance. Meal planning  Eat a variety of foods to get all the nutrients you need.  Some foods may  be tolerated better than others. Work with your dietitian to identify which foods are safest for you to eat.  Follow your meal plan as told by your dietitian. What foods are allowed? Grains Moist breads without nuts or seeds. Biscuits, muffins, pancakes, and waffles that are well-moistened with syrup, jelly, margarine, or butter. Cooked cereals. Moist bread stuffing. Moist rice. Well-moistened cold cereal with small chunks. Well-cooked pasta, noodles, rice, and bread dressing in small pieces and thick sauce. Soft dumplings or spaetzle in small pieces and butter or gravy. Vegetables Soft, well-cooked vegetables in small pieces. Soft-cooked, mashed potatoes. Thickened vegetable juice. Fruits Canned or cooked fruits that are soft or moist and do not have skin or seeds. Fresh, soft bananas. Thickened fruit juices. Meat and other protein foods Tender, moist meats or poultry in small pieces. Moist meatballs or meatloaf. Fish without bones. Eggs or egg substitutes in small pieces. Tofu. Tempeh and meat alternatives in small pieces. Well-cooked, tender beans, peas, baked beans, and other legumes. Dairy Thickened milk. Cream cheese. Yogurt. Cottage cheese. Sour cream. Small pieces of soft cheese. Fats and  oils Butter. Oils. Margarine. Mayonnaise. Gravy. Spreads. Sweets and desserts Soft, smooth, moist desserts. Pudding. Custard. Moist cakes. Jam. Jelly. Honey. Preserves. Ask your health care provider whether you can have frozen desserts. Seasoning and other foods All seasonings and sweeteners. All sauces with small chunks. Prepared tuna, egg, or chicken salad without raw fruits or vegetables. Moist casseroles with small, tender pieces of meat. Soups with tender meat. What foods are not allowed? Grains Coarse or dry cereals. Dry breads. Toast. Crackers. Tough, crusty breads, such as [place] bread and baguettes. Dry pancakes, waffles, and muffins. Sticky rice. Dry bread stuffing. Granola. Popcorn.  Chips. Vegetables All raw vegetables. Cooked corn. Rubbery or stiff cooked vegetables. Stringy vegetables, such as celery. Tough, crisp fried potatoes. Potato skins. Fruits Hard, crunchy, stringy, high-pulp, and juicy raw fruits such as apples, pineapple, papaya, and watermelon. Small, round fruits, such as grapes. Dried fruit and fruit leather. Meat and other protein foods Large pieces of meat. Dry, tough meats, such as bacon, sausage, and hot dogs. Chicken, [place], or fish with skin and bones. Crunchy peanut butter. Nuts. Seeds. Nut and seed butters. Dairy Yogurt with nuts, seeds, or large chunks. Large chunks of cheese. Frozen desserts and milk consistency not allowed by your dietitian. Sweets and desserts Dry cakes. Chewy or dry cookies. Any desserts with nuts, seeds, dry fruits, coconut, pineapple, or anything dry, sticky, or hard. Chewy caramel. Licorice. Taffy-type candies. Ask your health care provider whether you can have frozen desserts. Seasoning and other foods Soups with tough or large chunks of meats, poultry, or vegetables. Corn or clam chowder. Smoothies with large chunks of fruit. Summary  Bite size foods can be helpful for people with moderate swallowing problems.  On the dysphagia eating plan, you may eat foods that are soft, moist, and cut into pieces smaller than 42mm by 67mm.  You may be instructed to thicken liquids. Follow your health care provider's instructions about how to do this and to what consistency. This information is not intended to replace advice given to you by your health care provider. Make sure you discuss any questions you have with your health care provider. Document Released: 06/09/2005 Document Revised: 09/19/2016 Document Reviewed: 09/19/2016 Elsevier Interactive Patient Education  2019 [employer].

## 2018-08-11 NOTE — Assessment & Plan Note (Addendum)
Ongoing issues with hips and knees.  Recommend not using Aleeve.  Script sent for Diclofenac gel.  If worsening or continued issues return to clinic.  If continued issues at next visit will obtain imaging and discuss PT.

## 2018-08-11 NOTE — Assessment & Plan Note (Addendum)
Chronic, ongoing. HbA1c 7.4% today. Continue current medication regimen.  Focus on diet changes and exercise.  Recommend daily CBG monitoring. Follow-up in 3 months.  Consider addition of SGLT at next visit if continued elevation in A1C.

## 2018-08-11 NOTE — Assessment & Plan Note (Signed)
Chronic, ongoing. Well controlled. Continue current medication as prescribed. Follow-up in appx 3 months.

## 2018-08-11 NOTE — Assessment & Plan Note (Addendum)
Lipid panel today. Will assess need for medication adjustment based on lab results. Continue current medication regimen. Follow-up in 3 months

## 2018-08-11 NOTE — Assessment & Plan Note (Addendum)
Chronic, not well controlled. Referral to GI. Continue omeprazole. Educated on double swallow, chin tuck, bite size portions, and food diary with symptoms.

## 2018-08-12 LAB — COMPREHENSIVE METABOLIC PANEL
ALBUMIN: 4.5 g/dL (ref 3.7–4.7)
ALT: 14 IU/L (ref 0–32)
AST: 15 IU/L (ref 0–40)
Albumin/Globulin Ratio: 1.8 (ref 1.2–2.2)
Alkaline Phosphatase: 71 IU/L (ref 39–117)
BUN / CREAT RATIO: 17 (ref 12–28)
BUN: 17 mg/dL (ref 8–27)
Bilirubin Total: 0.3 mg/dL (ref 0.0–1.2)
CO2: 25 mmol/L (ref 20–29)
CREATININE: 1.03 mg/dL — AB (ref 0.57–1.00)
Calcium: 9.4 mg/dL (ref 8.7–10.3)
Chloride: 104 mmol/L (ref 96–106)
GFR calc non Af Amer: 53 mL/min/{1.73_m2} — ABNORMAL LOW (ref >59)
GFR, EST AFRICAN AMERICAN: 61 mL/min/{1.73_m2} (ref >59)
GLOBULIN, TOTAL: 2.5 g/dL (ref 1.5–4.5)
Glucose: 104 mg/dL — ABNORMAL HIGH (ref 65–99)
Potassium: 4.8 mmol/L (ref 3.5–5.2)
SODIUM: 144 mmol/L (ref 134–144)
TOTAL PROTEIN: 7 g/dL (ref 6.0–8.5)

## 2018-08-12 LAB — LIPID PANEL W/O CHOL/HDL RATIO
Cholesterol, Total: 194 mg/dL (ref 100–199)
HDL: 43 mg/dL (ref >39)
LDL Calculated: 108 mg/dL — ABNORMAL HIGH (ref 0–99)
TRIGLYCERIDES: 215 mg/dL — AB (ref 0–149)
VLDL Cholesterol Cal: 43 mg/dL — ABNORMAL HIGH (ref 5–40)

## 2018-08-14 ENCOUNTER — Other Ambulatory Visit: Payer: Self-pay | Admitting: Unknown Physician Specialty

## 2018-08-14 ENCOUNTER — Other Ambulatory Visit: Payer: Self-pay | Admitting: Nurse Practitioner

## 2018-08-16 NOTE — Telephone Encounter (Signed)
Requested medication (s) are due for refill today: yes  Requested medication (s) are on the active medication list: yes  Last refill:  Atorvastatin 20 mg LR 05/24/18 and metformin 500 mg LR 05/21/18  Future visit scheduled: yes  Notes to clinic:  antilipid - statins failed, diabetes - biguanides failed  Requested Prescriptions  Pending Prescriptions Disp Refills   atorvastatin (LIPITOR) 20 MG tablet [Pharmacy Med Name: Atorvastatin Calcium 20 MG Oral Tablet] 90 tablet 0    Sig: TAKE 1 TABLET BY MOUTH ONCE DAILY     Cardiovascular:  Antilipid - Statins Failed - 08/14/2018  3:23 PM      Failed - LDL in normal range and within 360 days    LDL Calculated  Date Value Ref Range Status  08/11/2018 108 (H) 0 - 99 mg/dL Final         Failed - Triglycerides in normal range and within 360 days    Triglycerides  Date Value Ref Range Status  08/11/2018 215 (H) 0 - 149 mg/dL Final   Triglycerides Piccolo,Waived  Date Value Ref Range Status  11/20/2015 90 <150 mg/dL Final    Comment:                            Normal                   <150                         Borderline High     150 - 199                         High                200 - 499                         Very High                >499          Passed - Total Cholesterol in normal range and within 360 days    Cholesterol, Total  Date Value Ref Range Status  08/11/2018 194 100 - 199 mg/dL Final   Cholesterol Piccolo, Waived  Date Value Ref Range Status  11/20/2015 194 <200 mg/dL Final    Comment:                            Desirable                <200                         Borderline High      200- 239                         High                     >239          Passed - HDL in normal range and within 360 days    HDL  Date Value Ref Range Status  08/11/2018 43 >39 mg/dL Final         Passed - Patient is not pregnant  Passed - Valid encounter within last 12 months    Recent Outpatient Visits           5 days ago 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[place])   [place], [place] T, NP   4 months ago Anemia, unspecified type   [place], [place] T, NP   9 months ago Mixed hyperlipidemia   [facility] Kathrine Haddock, NP   1 year ago Acute left-sided low back pain without sciatica   [facility] Kathrine Haddock, NP   1 year ago Uncontrolled type 2 diabetes mellitus with stage 3 chronic kidney disease, without long-term current use of insulin ([place])   [facility] Kathrine Haddock, NP      Future Appointments            In 2 months [name], [name], NP [facility]          metFORMIN (GLUCOPHAGE-XR) 500 MG 24 hr tablet [Pharmacy Med Name: metFORMIN HCl ER 500 MG Oral Tablet Extended Release 24 Hour] 360 tablet 0    Sig: TAKE 2 TABLETS BY MOUTH TWICE DAILY     Endocrinology:  Diabetes - Biguanides Failed - 08/14/2018  3:23 PM      Failed - Cr in normal range and within 360 days    Creatinine, Ser  Date Value Ref Range Status  08/11/2018 1.03 (H) 0.57 - 1.00 mg/dL Final         Passed - HBA1C is between 0 and 7.9 and within 180 days    Hemoglobin A1C  Date Value Ref Range Status  02/15/2016 7.4%  Final   HB A1C (BAYER DCA - WAIVED)  Date Value Ref Range Status  08/11/2018 7.4 (H) <7.0 % Final    Comment:                                          Diabetic Adult            <7.0                                       Healthy Adult        4.3 - 5.7                                                           (DCCT/NGSP) American Diabetes Association's Summary of Glycemic Recommendations for Adults with Diabetes: Hemoglobin A1c <7.0%. More stringent glycemic goals (A1c <6.0%) may further reduce complications at the cost of increased risk of hypoglycemia.          Passed - eGFR in normal range and within 360 days    GFR calc Af Amer  Date Value Ref Range  Status  08/11/2018 61 >59 mL/min/1.73 Final   GFR calc non Af Amer  Date Value Ref Range Status  08/11/2018 53 (L) >59 mL/min/1.73 Final         Passed - Valid encounter within last 6 months    Recent Outpatient Visits  5 days ago 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HCC)   [facility] [name], Jolene T, NP   4 months ago Anemia, unspecified type   [facility] [name], Jolene T, NP   9 months ago Mixed hyperlipidemia   [facility] W[place], Cheryl, NP   1 year ago Acute left-sided low back pain without sciatica   [facility] Wicker, Cheryl, NP   1 year ago Uncontrolled type 2 diabetes mellitus with stage 3 chronic kidney disease, without long-term current use of insulin (HCC)   [facility] Wicker, Cheryl, NP      Future Appointments            In 2 months [name], Jolene T, NP [facility]            

## 2018-08-17 ENCOUNTER — Ambulatory Visit: Payer: Self-pay | Admitting: Nurse Practitioner

## 2018-08-18 ENCOUNTER — Ambulatory Visit: Payer: Self-pay | Admitting: Nurse Practitioner

## 2018-08-28 ENCOUNTER — Other Ambulatory Visit: Payer: Self-pay | Admitting: Unknown Physician Specialty

## 2018-09-09 ENCOUNTER — Ambulatory Visit: Payer: Medicare Other | Admitting: Gastroenterology

## 2018-09-16 ENCOUNTER — Ambulatory Visit
Admission: EM | Admit: 2018-09-16 | Discharge: 2018-09-16 | Disposition: A | Discharge status: Home / Self Care | Payer: Medicare Other | Attending: Family Medicine | Admitting: Family Medicine

## 2018-09-16 ENCOUNTER — Encounter: Payer: Self-pay | Admitting: Emergency Medicine

## 2018-09-16 ENCOUNTER — Other Ambulatory Visit: Payer: Self-pay

## 2018-09-16 DIAGNOSIS — M5441 Lumbago with sciatica, right side: Secondary | ICD-10-CM

## 2018-09-16 MED ORDER — HYDROCODONE-ACETAMINOPHEN 5-325 MG PO TABS: 1.0000 | ORAL_TABLET | Freq: Three times a day (TID) | ORAL | 0 refills | Status: DC | PRN

## 2018-09-16 NOTE — Discharge Instructions (Signed)
Continue to use the gel.  Pain medication as needed.  If persists, discuss referral with your PCP.  Take care  Dr. Lacinda Axon

## 2018-09-16 NOTE — ED Triage Notes (Signed)
Pt c/o right leg pain. She states that pain is located in the back of her knee, her calf and the front of her leg down to her ankle. She describes it as a tight feeling, and shooting pain. She has mild swelling. Started about a month ago, maybe more. She states that she has had problems with this leg since having back surgery in 1999. No known injury or wounds.

## 2018-09-16 NOTE — ED Provider Notes (Signed)
[facility]    CSN: [id number] Arrival date & time: 09/16/18  1400  History   Chief Complaint Chief Complaint  Patient presents with  . Leg Pain    right   HPI  78 year old female presents with low back pain and right leg pain.  Patient reports that this is an ongoing issue.  Has been worse over the past month but particularly worse this week.  She states that the pain starts in her low back and radiates downward.  Pain has been severe at times.  Worse with getting up from a seated position, lying down, and walking.  No saddle anesthesia.  She has taken Aleve without resolution.  I have advised her that she should not take Aleve due to chronic kidney disease.  Patient states that she was given a prescription for steroids at her last primary care visit.  She is also been using Voltaren without resolution.  No recent fall, trauma, injury.  She has had prior back surgery.  No other associated symptoms.  No other complaints.  PMH, Surgical Hx, Family Hx, Social History reviewed and updated as below.  Past Medical History:  Diagnosis Date  . Anemia   . Arthritis   . Chronic kidney disease   . Colon polyps   . Diabetes mellitus without complication ([place])   . Diabetic neuropathy ([place])   . GERD (gastroesophageal reflux disease)   . Hyperlipemia   . Hypertension   . Osteoarthritis   . Radiculopathy   . Sleep apnea    does not have her cpap anymore  . Stress incontinence   . Urticaria   . Vitamin B 12 deficiency   . Wears glasses     Patient Active Problem List   Diagnosis Date Noted  . Sleep apnea 02/24/2017  . Urge incontinence 11/24/2016  . Benign neoplasm of sigmoid colon   . CKD (chronic kidney disease) stage 3, GFR 30-59 ml/min (HCC) 02/20/2015  . Anemia 01/16/2015  . Vitamin B12 deficiency 01/16/2015  . Radiculopathy 01/16/2015  . Uncontrolled type 2 diabetes mellitus with chronic kidney disease ([place]) 01/16/2015  . Osteoarthritis 01/16/2015  .  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[place]) 01/16/2015  . Hyperlipidemia associated with type 2 diabetes mellitus ([place]) 01/16/2015  . Diabetic neuropathy ([place]) 01/16/2015  . GERD (gastroesophageal reflux disease) 01/16/2015    Past Surgical History:  Procedure Laterality Date  . ABDOMINAL HYSTERECTOMY    . APPENDECTOMY    . BREAST BIOPSY Right    right-neg  . BREAST BIOPSY Left    us/bx/clip-neg  . CARPAL TUNNEL RELEASE     right  . COLONOSCOPY    . COLONOSCOPY WITH PROPOFOL N/A 12/10/2015   Procedure: COLONOSCOPY WITH PROPOFOL;  Surgeon: Lucilla Lame, MD;  Location: [place];  Service: Endoscopy;  Laterality: N/A;  Diabetic - oral meds Sleep Apnea LATEX allergy  . CYSTOSCOPY    . DILATION AND CURETTAGE OF UTERUS    . EYE SURGERY Bilateral    lens implant  . LUMBAR FUSION  2009  . POLYPECTOMY  12/10/2015   Procedure: POLYPECTOMY;  Surgeon: Lucilla Lame, MD;  Location: [place];  Service: Endoscopy;;  . RESECTION DISTAL CLAVICAL Right 11/25/2013   Procedure: RIGHT SHOULDER OPEN RESECTION DISTAL CLAVICAL EXCISION SOFT TISSUE TUMOR SHOULDER DEEP SUBFASCIAL INTRAMUSCULAR/DEBRIDEMENT/ROTATOR CUFF REPAIR/BICEPS TENODESIS/MANIPULATION;  Surgeon: Renette Butters, MD;  Location: [place];  Service: Orthopedics;  Laterality: Right;  . SHOULDER ARTHROSCOPY WITH ROTATOR CUFF  REPAIR AND SUBACROMIAL DECOMPRESSION Right 11/25/2013   Procedure: SHOULDER ARTHROSCOPY WITH DEBRIDEMENT ROTATOR CUFF REPAIR  A;  Surgeon: Renette Butters, MD;  Location: [place];  Service: Orthopedics;  Laterality: Right;    OB History   No obstetric history on file.      Home Medications    Prior to Admission medications   Medication Sig Start Date End Date Taking? Authorizing Provider  atorvastatin (LIPITOR) 20 MG tablet TAKE 1 TABLET BY MOUTH ONCE DAILY 08/16/18  Yes [name], Jolene T, NP  cloNIDine (CATAPRES) 0.1 MG tablet Take  0.1 mg by mouth 2 (two) times daily.   Yes [provider]  cyanocobalamin (,VITAMIN B-12,) 1000 MCG/ML injection Inject 1 mL (1,000 mcg total) into the muscle once. 03/21/15  Yes Kathrine Haddock, NP  hydrochlorothiazide (HYDRODIURIL) 25 MG tablet TAKE 1 TABLET BY MOUTH ONCE DAILY 08/16/18  Yes [name], Jolene T, NP  metFORMIN (GLUCOPHAGE-XR) 500 MG 24 hr tablet TAKE 2 TABLETS BY MOUTH TWICE DAILY 08/16/18  Yes [name], Jolene T, NP  metoprolol succinate (TOPROL-XL) 50 MG 24 hr tablet Take 1 tablet by mouth once daily 08/30/18  Yes [name], Jolene T, NP  omeprazole (PRILOSEC) 20 MG capsule TAKE 1 CAPSULE BY MOUTH ONCE DAILY 08/16/18  Yes [name], Jolene T, NP  glucose blood test strip Check daily. ICD 10 E11.9 01/16/15   Kathrine Haddock, NP  HYDROcodone-acetaminophen (NORCO/VICODIN) 5-325 MG tablet Take 1 tablet by mouth every 8 (eight) hours as needed for severe pain. 09/16/18   Coral Spikes, DO    Family History Family History  Problem Relation Age of Onset  . Heart disease Mother   . Diabetes Mother   . Hypertension Mother   . Heart disease Father   . Hypertension Father   . Diabetes Sister   . Hypertension Sister   . Heart disease Sister   . Diabetes Sister   . Hypertension Sister   . Heart disease Son 27  . Breast cancer Cousin 34    Social History Social History   Tobacco Use  . Smoking status: Former Smoker    Last attempt to quit: 11/22/1997    Years since quitting: 20.8  . Smokeless tobacco: Never Used  Substance Use Topics  . Alcohol use: No  . Drug use: No     Allergies   Contrast media [iodinated diagnostic agents]; Penicillins; Sulfa antibiotics; Latex; Neosporin [neomycin-bacitracin zn-polymyx]; and Other   Review of Systems Review of Systems  Constitutional: Negative.   Musculoskeletal: Positive for back pain.       Right leg pain.   Physical Exam Triage Vital Signs ED Triage Vitals  Enc Vitals Group     BP 09/16/18 1425 117/61     Pulse Rate  09/16/18 1425 81     Resp 09/16/18 1425 20     Temp 09/16/18 1425 98.5 F (36.9 C)     Temp Source 09/16/18 1425 Oral     SpO2 09/16/18 1425 99 %     Weight 09/16/18 1421 170 lb (77.1 kg)     Height 09/16/18 1421 5\' 2"  (1.575 m)     Head Circumference --      Peak Flow --      Pain Score 09/16/18 1421 5     Pain Loc --      Pain Edu? --      Excl. in [place]? --    Updated Vital Signs BP 117/61 (BP Location: Left Arm)   Pulse 81  Temp 98.5 F (36.9 C) (Oral)   Resp 20   Ht 5\' 2"  (1.575 m)   Wt 77.1 kg   LMP  (LMP Unknown)   SpO2 99%   BMI 31.09 kg/m   Visual Acuity Right Eye Distance:   Left Eye Distance:   Bilateral Distance:    Right Eye Near:   Left Eye Near:    Bilateral Near:     Physical Exam Vitals signs and nursing note reviewed.  Constitutional:      General: She is not in acute distress.    Appearance: Normal appearance.  HENT:     Head: Normocephalic and atraumatic.  Eyes:     General: No scleral icterus.    Conjunctiva/sclera: Conjunctivae normal.  Cardiovascular:     Rate and Rhythm: Normal rate and regular rhythm.  Pulmonary:     Effort: Pulmonary effort is normal.     Breath sounds: Normal breath sounds.  Musculoskeletal:     Comments: Decreased range of motion of the lumbar spine.  Patient has difficulty lying down in the bed.  Paraspinal musculature of the lumbar spine tender to palpation.  Neurological:     Mental Status: She is alert.  Psychiatric:        Mood and Affect: Mood normal.        Behavior: Behavior normal.    UC Treatments / Results  Labs (all labs ordered are listed, but only abnormal results are displayed) Labs Reviewed - No data to display  EKG None  Radiology No results found.  Procedures Procedures (including critical care time)  Medications Ordered in UC Medications - No data to display  Initial Impression / Assessment and Plan / UC Course  I have reviewed the triage vital signs and the nursing notes.   Pertinent labs & imaging results that were available during my care of the patient were reviewed by me and considered in my medical decision making (see chart for details).    78 year old female presents with low back pain with radiculopathy.  Cannot use NSAIDs in the setting of chronic kidney disease.  She has topical Voltaren at home.  Advised continued use as directed.  She has recently been on a course of steroids.  I am prescribing Vicodin for severe pain.  I have advised to follow-up with her primary care physician to discuss referral to orthopedics or spine/neurosurgery.   [place] Controlled substance database reviewed.  No concerns.  Final Clinical Impressions(s) / UC Diagnoses   Final diagnoses:  Acute right-sided low back pain with right-sided sciatica     Discharge Instructions     Continue to use the gel.  Pain medication as needed.  If persists, discuss referral with your PCP.  Take care  Dr. Lacinda Axon    ED Prescriptions    Medication Sig Dispense Auth. Provider   HYDROcodone-acetaminophen (NORCO/VICODIN) 5-325 MG tablet Take 1 tablet by mouth every 8 (eight) hours as needed for severe pain. 10 tablet Coral Spikes, DO     Controlled Substance Prescriptions [place] Controlled Substance Registry consulted? Yes, I have consulted the [place] Controlled Substances Registry for this patient, and feel the risk/benefit ratio today is favorable for proceeding with this prescription for a controlled substance.   Coral Spikes, [place] 09/16/18 [phone]

## 2018-09-17 ENCOUNTER — Telehealth: Payer: Self-pay | Admitting: Nurse Practitioner

## 2018-09-17 NOTE — Telephone Encounter (Signed)
Appointment scheduled.

## 2018-09-17 NOTE — Telephone Encounter (Signed)
Copied from [place] [id number]. Topic: Appointment Scheduling - Scheduling Inquiry for Clinic >> Sep 16, 2018 12:02 PM Reyne Dumas L wrote: Reason for CRM:   Pt called and left voicemail on [place] 09/15/2018.  States she needs to make an appointment to have her right leg checked. Pt can be reached at [phone]

## 2018-09-22 ENCOUNTER — Ambulatory Visit (INDEPENDENT_AMBULATORY_CARE_PROVIDER_SITE_OTHER): Payer: Medicare Other | Admitting: Nurse Practitioner

## 2018-09-22 ENCOUNTER — Other Ambulatory Visit: Payer: Self-pay

## 2018-09-22 ENCOUNTER — Encounter: Payer: Self-pay | Admitting: Nurse Practitioner

## 2018-09-22 VITALS — BP 139/75 | HR 92 | Temp 98.4°F | Ht 62.0 in | Wt 177.0 lb

## 2018-09-22 DIAGNOSIS — M5410 Radiculopathy, site unspecified: Secondary | ICD-10-CM

## 2018-09-22 DIAGNOSIS — E538 Deficiency of other specified B group vitamins: Secondary | ICD-10-CM

## 2018-09-22 MED ORDER — BLOOD GLUCOSE MONITOR KIT: PACK | 0 refills | Status: DC

## 2018-09-22 MED ORDER — GABAPENTIN 100 MG PO CAPS: 300.0000 mg | ORAL_CAPSULE | Freq: Every day | ORAL | 3 refills | Status: DC

## 2018-09-22 NOTE — Assessment & Plan Note (Signed)
Chronic, ongoing.  Recheck anemia panel and CBC today.  Vitamin B12 injection in office today.

## 2018-09-22 NOTE — Patient Instructions (Signed)
Acute Back Pain, Adult  Acute back pain is sudden and usually short-lived. It is often caused by an injury to the muscles and tissues in the back. The injury may result from:   A muscle or ligament getting overstretched or torn (strained). Ligaments are tissues that connect bones to each other. Lifting something improperly can cause a back strain.   Wear and tear (degeneration) of the spinal disks. Spinal disks are circular tissue that provides cushioning between the bones of the spine (vertebrae).   Twisting motions, such as while playing sports or doing yard work.   A hit to the back.   Arthritis.  You may have a physical exam, lab tests, and imaging tests to find the cause of your pain. Acute back pain usually goes away with rest and home care.  Follow these instructions at home:  Managing pain, stiffness, and swelling   Take over-the-counter and prescription medicines only as told by your health care provider.   Your health care provider may recommend applying ice during the first 24-48 hours after your pain starts. To do this:  ? Put ice in a plastic bag.  ? Place a towel between your skin and the bag.  ? Leave the ice on for 20 minutes, 2-3 times a day.   If directed, apply heat to the affected area as often as told by your health care provider. Use the heat source that your health care provider recommends, such as a moist heat pack or a heating pad.  ? Place a towel between your skin and the heat source.  ? Leave the heat on for 20-30 minutes.  ? Remove the heat if your skin turns bright red. This is especially important if you are unable to feel pain, heat, or cold. You have a greater risk of getting burned.  Activity     Do not stay in bed. Staying in bed for more than 1-2 days can delay your recovery.   Sit up and stand up straight. Avoid leaning forward when you sit, or hunching over when you stand.  ? If you work at a desk, sit close to it so you do not need to lean over. Keep your chin tucked  in. Keep your neck drawn back, and keep your elbows bent at a right angle. Your arms should look like the letter "L."  ? Sit high and close to the steering wheel when you drive. Add lower back (lumbar) support to your car seat, if needed.   Take short walks on even surfaces as soon as you are able. Try to increase the length of time you walk each day.   Do not sit, drive, or stand in one place for more than 30 minutes at a time. Sitting or standing for long periods of time can put stress on your back.   Do not drive or use heavy machinery while taking prescription pain medicine.   Use proper lifting techniques. When you bend and lift, use positions that put less stress on your back:  ? Bend your knees.  ? Keep the load close to your body.  ? Avoid twisting.   Exercise regularly as told by your health care provider. Exercising helps your back heal faster and helps prevent back injuries by keeping muscles strong and flexible.   Work with a physical therapist to make a safe exercise program, as recommended by your health care provider. Do any exercises as told by your physical therapist.  Lifestyle   Maintain   a healthy weight. Extra weight puts stress on your back and makes it difficult to have good posture.   Avoid activities or situations that make you feel anxious or stressed. Stress and anxiety increase muscle tension and can make back pain worse. Learn ways to manage anxiety and stress, such as through exercise.  General instructions   Sleep on a firm mattress in a comfortable position. Try lying on your side with your knees slightly bent. If you lie on your back, put a pillow under your knees.   Follow your treatment plan as told by your health care provider. This may include:  ? Cognitive or behavioral therapy.  ? Acupuncture or massage therapy.  ? Meditation or yoga.  Contact a health care provider if:   You have pain that is not relieved with rest or medicine.   You have increasing pain going down  into your legs or buttocks.   Your pain does not improve after 2 weeks.   You have pain at night.   You lose weight without trying.   You have a fever or chills.  Get help right away if:   You develop new bowel or bladder control problems.   You have unusual weakness or numbness in your arms or legs.   You develop nausea or vomiting.   You develop abdominal pain.   You feel faint.  Summary   Acute back pain is sudden and usually short-lived.   Use proper lifting techniques. When you bend and lift, use positions that put less stress on your back.   Take over-the-counter and prescription medicines and apply heat or ice as directed by your health care provider.  This information is not intended to replace advice given to you by your health care provider. Make sure you discuss any questions you have with your health care provider.  Document Released: 06/09/2005 Document Revised: 01/14/2018 Document Reviewed: 01/21/2017  Elsevier Interactive Patient Education  2019 [employer]vier Inc[employer]

## 2018-09-22 NOTE — Assessment & Plan Note (Signed)
Ongoing for several months, focused more to right lumbar and RLE.  XR lumbar spine ordered.  Will trial Gabapentin 300 MG at HS (CrCl 58.89).  Continue Voltaren gel and Tylenol as needed at home.  Avoid Ibuprofen.  Recommend regular stretches at home, discussed various videos available on YouTube, and application of heat as needed.  Consider PT referral and OT dependent on findings on imaging.  Return for worsening or continued symptoms at home.

## 2018-09-22 NOTE — Progress Notes (Signed)
BP 139/75    Pulse 92    Temp 98.4 F (36.9 C) (Oral)    Ht 5\' 2"  (1.575 m)    Wt 177 lb (80.3 kg)    LMP  (LMP Unknown)    SpO2 97%    BMI 32.37 kg/m    Subjective:    Patient ID: Tina Mcdonald, female    DOB: June 26, 1940, 78 y.o.   MRN: [id number]  HPI: Tina Mcdonald is a 78 y.o. female  Chief Complaint  Patient presents with   Knee Pain    right side. pt states she has had the pain for a while but got worse the last 3 weeks.   Leg Pain    right side   Other    pt would like to have her B12 injection   RIGHT KNEE AND LEG PAIN WITH BACK PAIN: Was seen at [facility] on 09/16/2018 for similar issue.  At time reported pain had been worsening over past month.  She was ordered Vicodin to take for severe pain only, but reports she did not take any of this due to concerns.  No NSAID due to kidney function, but uses Voltaren gel.  Recommendation at time per urgent care was for ortho consult.  She has history of back surgery in 1999, reports "this did good".  Then a few years ago started to have some mild back discomfort to right lower back, was told "arthritis would probably set in there".  In approx. October or November 2019 her right leg began to bother her, this has become "progressively worse".  She reports while she is bowling the leg does not bother her, has not been bowling the past 2-3 weeks and the leg pain has become worse.  At worst she reports the pain as 9/10 and at best 4/10.  Intermittent, burning discomfort with throbbing.  She does notice when the back pain is present the leg will start burning behind her upper thigh and radiate down her leg.  States pain is worse at night, does not want bed sheets touching legs because this make discomfort worse, and pain is worse when she is sitting for prolonged periods.  States it is like "my leg is numb and when I touch it feels numb, like something moving in there".  States that Tylenol does not help pain and Voltaren gel helps  back pain, but not the burning pain in her right leg.  Reports she feels like right leg is more swollen than left leg over past month.    B12 DEFICIENCY: Did not obtain her recent B12 injection, has "missed a couple months".  Reports no increase in fatigue.  Denies decreased concentration, SOB, or mouth pain.  Endorses occasional cold intolerance, easy bruising, and neuropathic discomfort to lower extremities, R>L.  No recent level noted in chart.  Agrees with recheck today.     Relevant past medical, surgical, family and social history reviewed and updated as indicated. Interim medical history since our last visit reviewed. Allergies and medications reviewed and updated.  Review of Systems  Constitutional: Negative for activity change, appetite change, diaphoresis, fatigue and fever.  Respiratory: Negative for cough, chest tightness and shortness of breath.   Cardiovascular: Negative for chest pain, palpitations and leg swelling.  Gastrointestinal: Negative for abdominal distention, abdominal pain, constipation, diarrhea, nausea and vomiting.  Endocrine: Negative for cold intolerance, heat intolerance, polydipsia, polyphagia and polyuria.  Musculoskeletal: Positive for back pain (with radiation down right leg).  Neurological: Negative for dizziness, syncope, weakness, light-headedness, numbness and headaches.  Psychiatric/Behavioral: Negative.     Per HPI unless specifically indicated above     Objective:    BP 139/75    Pulse 92    Temp 98.4 F (36.9 C) (Oral)    Ht 5\' 2"  (1.575 m)    Wt 177 lb (80.3 kg)    LMP  (LMP Unknown)    SpO2 97%    BMI 32.37 kg/m   Wt Readings from Last 3 Encounters:  09/22/18 177 lb (80.3 kg)  09/16/18 170 lb (77.1 kg)  08/11/18 178 lb (80.7 kg)    Physical Exam Vitals signs and nursing note reviewed.  Constitutional:      Appearance: She is well-developed.  HENT:     Head: Normocephalic.  Eyes:     General:        Right eye: No discharge.         Left eye: No discharge.     Conjunctiva/sclera: Conjunctivae normal.     Pupils: Pupils are equal, round, and reactive to light.  Neck:     Musculoskeletal: Normal range of motion and neck supple.     Thyroid: No thyromegaly.     Vascular: No carotid bruit or JVD.  Cardiovascular:     Rate and Rhythm: Normal rate and regular rhythm.     Heart sounds: Normal heart sounds. No murmur. No gallop.   Pulmonary:     Effort: Pulmonary effort is normal.     Breath sounds: Normal breath sounds.  Abdominal:     General: Bowel sounds are normal.     Palpations: Abdomen is soft.  Musculoskeletal:     Lumbar back: She exhibits tenderness (to right lateral) and pain. She exhibits no swelling, no edema, no laceration and no spasm.     Right lower leg: She exhibits no tenderness and no laceration. Edema (trace) present.     Left lower leg: She exhibits no tenderness and no laceration. Edema (trace) present.     Comments: Negative Homan's bilaterally.  Calf circumference 15 cm bilaterally.  Lymphadenopathy:     Cervical: No cervical adenopathy.  Skin:    General: Skin is warm and dry.     Findings: Bruising present.     Comments: Scattered pale purple bruises bilateral lower and upper extremities.  Neurological:     Mental Status: She is alert and oriented to person, place, and time.  Psychiatric:        Mood and Affect: Mood normal.        Behavior: Behavior normal.        Thought Content: Thought content normal.        Judgment: Judgment normal.    Back Exam:    Inspection:  Normal spinal curvature.  No deformity, ecchymosis, erythema, or lesions   Curvature: Normal   Deformity: no  Ecchymosis: no none  Erythema:  no none  Lesions: no    Palpation:     Midline spinal tenderness: no none                 Paralumbar tenderness: yes, right lower     Parathoracic tenderness: none     Buttocks tenderness: none     Range of Motion:      Flexion: Fingers to Mid-[place]ibia      Extension:Decreased     Lateral bending:Decreased    Rotation:Normal    Neuro Exam:Abnormal decreased sensation R > L foot  Patellar DTRs: Normal     Ankle dorsiflexion strength:Within Normal Limits     Sensation(medial malleolus):3/5     Great toe dorsiflexion strength: Within Normal Limits     Sensation (mid dorsal foot):3/5     Ankle DTRs: Normal     Ankle plantar flexion strength:Within Normal Limits     Sensation (lateral heel):Diminished    Special Tests:      Straight leg raise:negative     Crossed straight leg raise:negative     Stork test: negative   Diabetic Foot Exam - Simple   Simple Foot Form Visual Inspection No deformities, no ulcerations, no other skin breakdown bilaterally:  Yes Sensation Testing See comments:  Yes Pulse Check See comments:  Yes Comments DP 2 + bilaterally and PT 1 +.  Trace edema bilateral ankle.  Sensation right and left foot diminished, R > L.     Results for orders placed or performed in visit on 08/11/18  Bayer DCA Hb A1c Waived  Result Value Ref Range   HB A1C (BAYER DCA - WAIVED) 7.4 (H) <7.0 %  Lipid Panel w/o Chol/HDL Ratio  Result Value Ref Range   Cholesterol, Total 194 100 - 199 mg/dL   Triglycerides 215 (H) 0 - 149 mg/dL   HDL 43 >39 mg/dL   VLDL Cholesterol Cal 43 (H) 5 - 40 mg/dL   LDL Calculated 108 (H) 0 - 99 mg/dL  Comprehensive metabolic panel  Result Value Ref Range   Glucose 104 (H) 65 - 99 mg/dL   BUN 17 8 - 27 mg/dL   Creatinine, Ser 1.03 (H) 0.57 - 1.00 mg/dL   GFR calc non Af Amer 53 (L) >59 mL/min/1.73   GFR calc Af Amer 61 >59 mL/min/1.73   BUN/Creatinine Ratio 17 12 - 28   Sodium 144 134 - 144 mmol/L   Potassium 4.8 3.5 - 5.2 mmol/L   Chloride 104 96 - 106 mmol/L   CO2 25 20 - 29 mmol/L   Calcium 9.4 8.7 - 10.3 mg/dL   Total Protein 7.0 6.0 - 8.5 g/dL   Albumin 4.5 3.7 - 4.7 g/dL   Globulin, Total 2.5 1.5 - 4.5 g/dL   Albumin/Globulin Ratio 1.8 1.2 - 2.2   Bilirubin Total 0.3 0.0 - 1.2 mg/dL    Alkaline Phosphatase 71 39 - 117 IU/L   AST 15 0 - 40 IU/L   ALT 14 0 - 32 IU/L    CRCL 58.89     Assessment & Plan:   Problem List Items Addressed This Visit      Nervous and Auditory   Radiculopathy - Primary    Ongoing for several months, focused more to right lumbar and RLE.  XR lumbar spine ordered.  Will trial Gabapentin 300 MG at HS (CrCl 58.89).  Continue Voltaren gel and Tylenol as needed at home.  Avoid Ibuprofen.  Recommend regular stretches at home, discussed various videos available on YouTube, and application of heat as needed.  Consider PT referral and OT dependent on findings on imaging.  Return for worsening or continued symptoms at home.        Relevant Medications   gabapentin (NEURONTIN) 100 MG capsule   Other Relevant Orders   DG Lumbar Spine Complete     Other   Vitamin B12 deficiency    Chronic, ongoing.  Recheck anemia panel and CBC today.  Vitamin B12 injection in office today.      Relevant Orders   CBC with Differential/Platelet   Anemia  panel       Follow up plan: Return for as scheduled in May.

## 2018-09-23 ENCOUNTER — Ambulatory Visit
Admission: RE | Admit: 2018-09-23 | Discharge: 2018-09-23 | Disposition: A | Discharge status: Home / Self Care | Payer: Medicare Other | Source: Ambulatory Visit | Attending: Nurse Practitioner | Admitting: Nurse Practitioner

## 2018-09-23 ENCOUNTER — Other Ambulatory Visit: Payer: Self-pay

## 2018-09-23 ENCOUNTER — Ambulatory Visit
Admission: RE | Admit: 2018-09-23 | Discharge: 2018-09-23 | Disposition: A | Discharge status: Home / Self Care | Payer: Medicare Other | Attending: Nurse Practitioner | Admitting: Nurse Practitioner

## 2018-09-23 DIAGNOSIS — M5410 Radiculopathy, site unspecified: Secondary | ICD-10-CM | POA: Insufficient documentation

## 2018-09-23 DIAGNOSIS — M545 Low back pain: Secondary | ICD-10-CM | POA: Diagnosis not present

## 2018-09-23 LAB — CBC WITH DIFFERENTIAL/PLATELET
Basophils Absolute: 0 10*3/uL (ref 0.0–0.2)
Basos: 0 %
EOS (ABSOLUTE): 0.2 10*3/uL (ref 0.0–0.4)
Eos: 2 %
Hemoglobin: 10.4 g/dL — ABNORMAL LOW (ref 11.1–15.9)
Immature Grans (Abs): 0 10*3/uL (ref 0.0–0.1)
Immature Granulocytes: 0 %
Lymphocytes Absolute: 2.6 10*3/uL (ref 0.7–3.1)
Lymphs: 39 %
MCH: 23 pg — ABNORMAL LOW (ref 26.6–33.0)
MCHC: 29.9 g/dL — ABNORMAL LOW (ref 31.5–35.7)
MCV: 77 fL — ABNORMAL LOW (ref 79–97)
Monocytes Absolute: 0.5 10*3/uL (ref 0.1–0.9)
Monocytes: 8 %
Neutrophils Absolute: 3.4 10*3/uL (ref 1.4–7.0)
Neutrophils: 51 %
Platelets: 241 10*3/uL (ref 150–450)
RBC: 4.53 x10E6/uL (ref 3.77–5.28)
RDW: 14.6 % (ref 11.7–15.4)
WBC: 6.6 10*3/uL (ref 3.4–10.8)

## 2018-09-23 LAB — ANEMIA PANEL
Ferritin: 77 ng/mL (ref 15–150)
Folate, Hemolysate: 358 ng/mL
Folate, RBC: 1029 ng/mL (ref >498)
Hematocrit: 34.8 % (ref 34.0–46.6)
Iron Saturation: 12 % — ABNORMAL LOW (ref 15–55)
Iron: 34 ug/dL (ref 27–139)
Retic Ct Pct: 1.9 % (ref 0.6–2.6)
Total Iron Binding Capacity: 277 ug/dL (ref 250–450)
UIBC: 243 ug/dL (ref 118–369)
Vitamin B-12: 298 pg/mL (ref 232–1245)

## 2018-09-27 ENCOUNTER — Telehealth: Payer: Self-pay | Admitting: Nurse Practitioner

## 2018-09-27 NOTE — Telephone Encounter (Signed)
Copied from [place] [phone]. Topic: Quick Communication - See Telephone Encounter >> Sep 27, 2018  9:38 AM Blase Mess A wrote: CRM for notification. See Telephone encounter for: 09/27/18.  Patient is calling for advice from [facility]. Jolene advised the patient to take an iron supplement. Was advised to take 325mg . However, all that the patient can find is 65mg . Please advise. [phone] (H)

## 2018-09-27 NOTE — Telephone Encounter (Signed)
Please alert patient that she can take the 65 MG at this time and will recheck labs at next visit.  If iron improved we can continue this dose and if not improved we can change to taking two tablets a day.  Thank you.

## 2018-09-28 NOTE — Telephone Encounter (Signed)
Patient notified of Jolene's message and verbalized understanding.  ?

## 2018-10-01 ENCOUNTER — Telehealth: Payer: Self-pay | Admitting: Nurse Practitioner

## 2018-10-01 DIAGNOSIS — M5410 Radiculopathy, site unspecified: Secondary | ICD-10-CM

## 2018-10-01 NOTE — Telephone Encounter (Signed)
Spoke to patient via telephone.  She reports Gabapentin is helping her pain at night and she can sleep, but during daytime hours feels like her right hip and lower back are "locking up" when she is moving around and this is interfering with her ADLs.  States she is using Voltaren and Tylenol, which helps some.  Also is using rest, which helps.  Would like ortho referral as discussed previously.  Referral placed to [facility].

## 2018-10-01 NOTE — Telephone Encounter (Signed)
Copied from [place] [phone]. Topic: General - Inquiry >> Sep 30, 2018  4:27 PM Rainey Pines A wrote: Reason for CRM: Patient called and said that her leg is still bothering her and that its getting to the point where she cannot move it. Patient is requesting a callback from her PCP in regards to this. Patient is unsure if she may need the referral that was discussed at her last appointment. Best contact number is [phone]

## 2018-11-03 ENCOUNTER — Ambulatory Visit: Payer: Medicare Other | Admitting: Gastroenterology

## 2018-11-08 ENCOUNTER — Telehealth: Payer: Self-pay | Admitting: Nurse Practitioner

## 2018-11-08 DIAGNOSIS — E538 Deficiency of other specified B group vitamins: Secondary | ICD-10-CM

## 2018-11-08 MED ORDER — CYANOCOBALAMIN 1000 MCG/ML IJ SOLN
1000.0000 ug | Freq: Once | INTRAMUSCULAR | Status: AC
Start: 2018-11-09 — End: 2018-11-11
  Administered 2018-11-11: 1000 ug via INTRAMUSCULAR

## 2018-11-08 NOTE — Telephone Encounter (Signed)
Order is in.

## 2018-11-08 NOTE — Telephone Encounter (Signed)
Can you put in an order for her B12 injection. She has an appt tomorrow but is trying to get there skype downloaded.  Thank you

## 2018-11-09 ENCOUNTER — Other Ambulatory Visit: Payer: Self-pay

## 2018-11-09 ENCOUNTER — Encounter: Payer: Self-pay | Admitting: Nurse Practitioner

## 2018-11-09 ENCOUNTER — Ambulatory Visit (INDEPENDENT_AMBULATORY_CARE_PROVIDER_SITE_OTHER): Payer: Medicare Other | Admitting: Nurse Practitioner

## 2018-11-09 ENCOUNTER — Ambulatory Visit: Payer: Medicare Other

## 2018-11-09 DIAGNOSIS — G4733 Obstructive sleep apnea (adult) (pediatric): Secondary | ICD-10-CM | POA: Diagnosis not present

## 2018-11-09 DIAGNOSIS — D508 Other iron deficiency anemias: Secondary | ICD-10-CM

## 2018-11-09 DIAGNOSIS — N183 Chronic kidney disease, stage 3 unspecified: Secondary | ICD-10-CM

## 2018-11-09 DIAGNOSIS — E1169 Type 2 diabetes mellitus with other specified complication: Secondary | ICD-10-CM | POA: Diagnosis not present

## 2018-11-09 DIAGNOSIS — E1165 Type 2 diabetes mellitus with hyperglycemia: Secondary | ICD-10-CM

## 2018-11-09 DIAGNOSIS — I1310 Hypertensive heart and chronic kidney disease without heart failure, with stage 1 through stage 4 chronic kidney disease, or unspecified chronic kidney disease: Secondary | ICD-10-CM | POA: Diagnosis not present

## 2018-11-09 DIAGNOSIS — E538 Deficiency of other specified B group vitamins: Secondary | ICD-10-CM

## 2018-11-09 DIAGNOSIS — E1122 Type 2 diabetes mellitus with diabetic chronic kidney disease: Secondary | ICD-10-CM | POA: Diagnosis not present

## 2018-11-09 DIAGNOSIS — IMO0002 Reserved for concepts with insufficient information to code with codable children: Secondary | ICD-10-CM

## 2018-11-09 DIAGNOSIS — R059 Cough, unspecified: Secondary | ICD-10-CM

## 2018-11-09 DIAGNOSIS — R05 Cough: Secondary | ICD-10-CM

## 2018-11-09 DIAGNOSIS — E785 Hyperlipidemia, unspecified: Secondary | ICD-10-CM

## 2018-11-09 MED ORDER — FLUTICASONE PROPIONATE 50 MCG/ACT NA SUSP: 2.0000 | Freq: Every day | NASAL | 6 refills | Status: DC

## 2018-11-09 MED ORDER — DOXYCYCLINE HYCLATE 100 MG PO TABS: 100.0000 mg | ORAL_TABLET | Freq: Two times a day (BID) | ORAL | 0 refills | Status: AC

## 2018-11-09 NOTE — Patient Instructions (Signed)
Carbohydrate Counting for Diabetes Mellitus, Adult  Carbohydrate counting is a method of keeping track of how many carbohydrates you eat. Eating carbohydrates naturally increases the amount of sugar (glucose) in the blood. Counting how many carbohydrates you eat helps keep your blood glucose within normal limits, which helps you manage your diabetes (diabetes mellitus). It is important to know how many carbohydrates you can safely have in each meal. This is different for every person. A diet and nutrition specialist (registered dietitian) can help you make a meal plan and calculate how many carbohydrates you should have at each meal and snack. Carbohydrates are found in the following foods:  Grains, such as breads and cereals.  Dried beans and soy products.  Starchy vegetables, such as potatoes, peas, and corn.  Fruit and fruit juices.  Milk and yogurt.  Sweets and snack foods, such as cake, cookies, candy, chips, and soft drinks. How do I count carbohydrates? There are two ways to count carbohydrates in food. You can use either of the methods or a combination of both. Reading "Nutrition Facts" on packaged food The "Nutrition Facts" list is included on the labels of almost all packaged foods and beverages in the [place] It includes:  The serving size.  Information about nutrients in each serving, including the grams (g) of carbohydrate per serving. To use the "Nutrition Facts":  Decide how many servings you will have.  Multiply the number of servings by the number of carbohydrates per serving.  The resulting number is the total amount of carbohydrates that you will be having. Learning standard serving sizes of other foods When you eat carbohydrate foods that are not packaged or do not include "Nutrition Facts" on the label, you need to measure the servings in order to count the amount of carbohydrates:  Measure the foods that you will eat with a food scale or measuring cup, if needed.   Decide how many standard-size servings you will eat.  Multiply the number of servings by 15. Most carbohydrate-rich foods have about 15 g of carbohydrates per serving. ? For example, if you eat 8 oz (170 g) of strawberries, you will have eaten 2 servings and 30 g of carbohydrates (2 servings x 15 g = 30 g).  For foods that have more than one food mixed, such as soups and casseroles, you must count the carbohydrates in each food that is included. The following list contains standard serving sizes of common carbohydrate-rich foods. Each of these servings has about 15 g of carbohydrates:   hamburger bun or  English muffin.   oz (15 mL) syrup.   oz (14 g) jelly.  1 slice of bread.  1 six-inch tortilla.  3 oz (85 g) cooked rice or pasta.  4 oz (113 g) cooked dried beans.  4 oz (113 g) starchy vegetable, such as peas, corn, or potatoes.  4 oz (113 g) hot cereal.  4 oz (113 g) mashed potatoes or  of a large baked potato.  4 oz (113 g) canned or frozen fruit.  4 oz (120 mL) fruit juice.  4-6 crackers.  6 chicken nuggets.  6 oz (170 g) unsweetened dry cereal.  6 oz (170 g) plain fat-free yogurt or yogurt sweetened with artificial sweeteners.  8 oz (240 mL) milk.  8 oz (170 g) fresh fruit or one small piece of fruit.  24 oz (680 g) popped popcorn. Example of carbohydrate counting Sample meal  3 oz (85 g) chicken breast.  6 oz (170 g)   brown rice.  4 oz (113 g) corn.  8 oz (240 mL) milk.  8 oz (170 g) strawberries with sugar-free whipped topping. Carbohydrate calculation 1. Identify the foods that contain carbohydrates: ? Rice. ? Corn. ? Milk. ? Strawberries. 2. Calculate how many servings you have of each food: ? 2 servings rice. ? 1 serving corn. ? 1 serving milk. ? 1 serving strawberries. 3. Multiply each number of servings by 15 g: ? 2 servings rice x 15 g = 30 g. ? 1 serving corn x 15 g = 15 g. ? 1 serving milk x 15 g = 15 g. ? 1 serving  strawberries x 15 g = 15 g. 4. Add together all of the amounts to find the total grams of carbohydrates eaten: ? 30 g + 15 g + 15 g + 15 g = 75 g of carbohydrates total. Summary  Carbohydrate counting is a method of keeping track of how many carbohydrates you eat.  Eating carbohydrates naturally increases the amount of sugar (glucose) in the blood.  Counting how many carbohydrates you eat helps keep your blood glucose within normal limits, which helps you manage your diabetes.  A diet and nutrition specialist (registered dietitian) can help you make a meal plan and calculate how many carbohydrates you should have at each meal and snack. This information is not intended to replace advice given to you by your health care provider. Make sure you discuss any questions you have with your health care provider. Document Released: 06/09/2005 Document Revised: 12/17/2016 Document Reviewed: 11/21/2015 Elsevier Interactive Patient Education  2019 Elsevier Inc.  

## 2018-11-09 NOTE — Progress Notes (Signed)
LMP  (LMP Unknown)    Subjective:    Patient ID: Tina Mcdonald, female    DOB: 07-14-1940, 78 y.o.   MRN: [id number]  HPI: LIZABETH Mcdonald is a 78 y.o. female  Chief Complaint  Patient presents with   Diabetes   Hyperlipidemia   Hypertension   Cough   Diarrhea     This visit was completed via D[employer]ity due to the restrictions of the COVID-19 pandemic. All issues as above were discussed and addressed. Physical exam was done as above through visual confirmation on Doximity. If it was felt that the patient should be evaluated in the office, they were directed there. The patient verbally consented to this visit.  Location of the patient: home  Location of the provider: home  Those involved with this call:   Provider: Marnee Guarneri, DNP  CMA: Tiffany Reel, CMA  Front Desk/Registration: Linard Millers   Time spent on call: 25 minutes with patient face to face via video conference. More than 50% of this time was spent in counseling and coordination of care. 10 minutes total spent in review of patient's record and preparation of their chart. I verified patient identity using two factors (patient name and date of birth). Patient consents verbally to being seen via telemedicine visit today.   DIABETES Last A1C 7.4%.  Continues on Metformin 1000 MG daily.  Gabapentin recently started at 300 MG QHS for neuropathy.  CrCl 58.89 based on recent labs.  Reports "tremendous" difference.  She reports she has stopped "drinking so much tea" and sugars have improved, but she does endorse "craving" sugar.  Had chocolate cake her sister made her in her house right now and has been "giving small slices away so I do not eat it" + has a "bucket full of strawberries I am only eating a few at a time". Hypoglycemic episodes:no Polydipsia/polyuria: no Visual disturbance: no Chest pain: no Paresthesias: no Glucose Monitoring: yes  Accucheck frequency: Daily  Fasting glucose: 128 yesterday  morning, has been running 107 to 128  Post prandial:  Evening:  Before meals: Taking Insulin?: no  Long acting insulin:  Short acting insulin: Blood Pressure Monitoring: not checking Retinal Examination: Not up to Date Foot Exam: Not up to Date Pneumovax: Up to Date Influenza: Up to Date Aspirin: no   HYPERTENSION / HYPERLIPIDEMIA Continues on Metoprolol XL 50 MG daily, Clonidine 0.1 MG BID, and HCTZ 25 MG dailt for HTN + Atorvastatin 20 MG daily for HTN. Satisfied with current treatment? yes Duration of hypertension: chronic BP monitoring frequency: not checking BP range:  BP medication side effects: no Duration of hyperlipidemia: chronic Cholesterol medication side effects: no Cholesterol supplements: none Medication compliance: good compliance Aspirin: no Recent stressors: no Recurrent headaches: no Visual changes: no Palpitations: no Dyspnea: no Chest pain: no Lower extremity edema: no Dizzy/lightheaded: no   COUGH & DIARRHEA Feels diarrhea is from starting iron, as it started at same time she started iron.  She reports noting diarrhea has decreased over past week and is returning more to normal routine.  No abdominal pain or N&V with diarrhea.  States diarrhea is improving.  Discussed switching to Slow Fe daily which may offer less GI affects.  Continues on monthly B12 injections.  Recently had to replace her [facility] and ducts, service man reported to her that mold and mildew was in system due to previous group not putting system in properly.  Has had new system in for 3 weeks and noticed cough  started Saturday.  Last saw Dr. Raul Del in February for cough, possible early COPD and spirometry minimal obstruction per note review.  She smoked when she was young, quit in 1999. Does endorse bad acid reflux Saturday morning, first event she has had since starting Omeprazole.  Ate late that night and laid down afterwards, which she reports "I know I should not do".  Had sleep testing  and had diagnosis of sleep apnea (ordered by pulmonary), but could not afford testing to ensure CPAP was working (would cost about $300 and she had already paid over $600 for testing).  Has not been around a positive COVID 19 patient or travelled overseas recently.  Has been "rarely" going out. Fever: no Cough: yes Shortness of breath: no Wheezing: yes, only a little  Chest pain: no Chest tightness: no Chest congestion: yes with clear and whitish thick sputum Nasal congestion: no Runny nose: yes Post nasal drip: no Sneezing: no Sore throat: no Swollen glands: no Sinus pressure: no Headache: no Face pain: no Toothache: no Ear pain: none Ear pressure: none Eyes red/itching:no Eye drainage/crusting: no  Vomiting: no Rash: no Fatigue: no  Weight loss: no Decreased appetite: no Diarrhea: yes Constipation: no Blood in stool: no Heartburn: x one episode, no further Jaundice: no Recurrent NSAID use: no Sick contacts: no Strep contacts: no  Context: fluctuating Recurrent sinusitis: no Relief with OTC cold/cough medications: no  Treatments attempted: none   Relevant past medical, surgical, family and social history reviewed and updated as indicated. Interim medical history since our last visit reviewed. Allergies and medications reviewed and updated.  Review of Systems  Constitutional: Negative for activity change, appetite change, chills, diaphoresis, fatigue and fever.  HENT: Negative for congestion, ear discharge, ear pain, facial swelling, postnasal drip, rhinorrhea, sinus pressure, sinus pain, sneezing, sore throat and voice change.   Eyes: Negative for pain and visual disturbance.  Respiratory: Positive for cough and wheezing (occasional). Negative for shortness of breath.   Cardiovascular: Negative for chest pain, palpitations and leg swelling.  Gastrointestinal: Negative for abdominal distention, abdominal pain, constipation, diarrhea, nausea and vomiting.  Endocrine:  Negative.  Negative for cold intolerance, heat intolerance, polydipsia, polyphagia and polyuria.  Musculoskeletal: Negative for myalgias.  Neurological: Negative for dizziness, numbness and headaches.  Psychiatric/Behavioral: Negative.     Per HPI unless specifically indicated above     Objective:    LMP  (LMP Unknown)   Wt Readings from Last 3 Encounters:  09/22/18 177 lb (80.3 kg)  09/16/18 170 lb (77.1 kg)  08/11/18 178 lb (80.7 kg)    Physical Exam Vitals signs and nursing note reviewed.  Constitutional:      General: She is awake. She is not in acute distress.    Appearance: She is well-developed. She is obese. She is not ill-appearing.  HENT:     Head: Normocephalic.     Right Ear: Hearing normal.     Left Ear: Hearing normal.     Nose: Nose normal.  Eyes:     General: Lids are normal.        Right eye: No discharge.        Left eye: No discharge.     Conjunctiva/sclera: Conjunctivae normal.  Neck:     Musculoskeletal: Normal range of motion.  Cardiovascular:     Comments: Unable to auscultate due to virtual exam only Pulmonary:     Effort: Pulmonary effort is normal. No accessory muscle usage or respiratory distress.     Comments:  Unable to auscultate due to virtual exam only.  No SOB with talking during visit.  X 1 episode of nonproductive cough note. Neurological:     Mental Status: She is alert and oriented to person, place, and time.  Psychiatric:        Attention and Perception: Attention normal.        Mood and Affect: Mood normal.        Behavior: Behavior normal. Behavior is cooperative.        Thought Content: Thought content normal.        Judgment: Judgment normal.     Results for orders placed or performed in visit on 09/22/18  CBC with Differential/Platelet  Result Value Ref Range   WBC 6.6 3.4 - 10.8 x10E3/uL   RBC 4.53 3.77 - 5.28 x10E6/uL   Hemoglobin 10.4 (L) 11.1 - 15.9 g/dL   MCV 77 (L) 79 - 97 fL   MCH 23.0 (L) 26.6 - 33.0 pg   MCHC  29.9 (L) 31.5 - 35.7 g/dL   RDW 14.6 11.7 - 15.4 %   Platelets 241 150 - 450 x10E3/uL   Neutrophils 51 Not Estab. %   Lymphs 39 Not Estab. %   Monocytes 8 Not Estab. %   Eos 2 Not Estab. %   Basos 0 Not Estab. %   Neutrophils Absolute 3.4 1.4 - 7.0 x10E3/uL   Lymphocytes Absolute 2.6 0.7 - 3.1 x10E3/uL   Monocytes Absolute 0.5 0.1 - 0.9 x10E3/uL   EOS (ABSOLUTE) 0.2 0.0 - 0.4 x10E3/uL   Basophils Absolute 0.0 0.0 - 0.2 x10E3/uL   Immature Granulocytes 0 Not Estab. %   Immature Grans (Abs) 0.0 0.0 - 0.1 x10E3/uL  Anemia panel  Result Value Ref Range   Total Iron Binding Capacity 277 250 - 450 ug/dL   UIBC 243 118 - 369 ug/dL   Iron 34 27 - 139 ug/dL   Iron Saturation 12 (L) 15 - 55 %   Vitamin B-12 298 232 - 1,245 pg/mL   Folate, Hemolysate 358.0 Not Estab. ng/mL   Hematocrit 34.8 34.0 - 46.6 %   Folate, RBC 1,029 >498 ng/mL   Ferritin 77 15 - 150 ng/mL   Retic Ct Pct 1.9 0.6 - 2.6 %      Assessment & Plan:   Problem List Items Addressed This Visit      Cardiovascular and Mediastinum   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HCC)    Chronic, ongoing.  Has been at goal at office visits, recommend obtaining BP cuff for home and checking three mornings a week.  Continue current medication regimen.  Outpatient labs ordered.  Follow-up in 3 months.        Respiratory   Sleep apnea    CCM referral, will determine whether assistance available to obtain equipment and final testing.        Endocrine   Uncontrolled type 2 diabetes mellitus with chronic kidney disease ([place]) - Primary    Chronic, ongoing.  Last A1C 7.4%, outpatient labs ordered.  Continue current medication regimen and adjust as needed based on labs.  Recent GFR 61 and CrCl 58.89.  Continue focus on diet and exercise.  Consider SGLT addition or GLP if continued elevation A1C above goal.  CCM referral placed.  Follow-up in 3 months.      Relevant Orders   Ambulatory referral to  Chronic Care Management Services   [facility] - B Hb A1c Waived   Microalbumin, Urine Waived  Basic Metabolic Panel (BMET)   Hyperlipidemia associated with type 2 diabetes mellitus (HCC)    Chronic, ongoing.  Continue current medication regimen.  Lipid panel next visit.        Other   Iron deficiency anemia    Suspect diarrhea related to recent addition of ferrous sulfate daily, although she reports this is improving.  Have recommend switch to Slow Fe if continued issues.  Recheck CBC and iron level outpatient.  Was on low side of normal last visit.      Relevant Medications   ferrous sulfate 325 (65 FE) MG EC tablet   Vitamin B12 deficiency    Chronic, ongoing.  Continue current medication regimen, recommend change to Slow Fe.  Obtain outpatient labs.      Relevant Orders   CBC with Differential/Platelet   Anemia panel   Cough    Productive at this time, suspect related to recent home repairs and allergic rhinitis.  Doxycycline script sent and Flonase.  Recommend use of daily Claritin + may take Coricidin or Mucinex as needed for cough.  Return to office for worsening or continued symptoms.         I discussed the assessment and treatment plan with the patient. The patient was provided an opportunity to ask questions and all were answered. The patient agreed with the plan and demonstrated an understanding of the instructions.   The patient was advised to call back or seek an in-person evaluation if the symptoms worsen or if the condition fails to improve as anticipated.   I provided 21+ minutes of time during this encounter.  Follow up plan: Return in about 3 months (around 02/09/2019) for T2DM, HTN/HLD, Anemia, CKD, Cough.

## 2018-11-09 NOTE — Assessment & Plan Note (Signed)
Chronic, ongoing.  Continue current medication regimen.  Lipid panel next visit. 

## 2018-11-09 NOTE — Assessment & Plan Note (Signed)
Suspect diarrhea related to recent addition of ferrous sulfate daily, although she reports this is improving.  Have recommend switch to Slow Fe if continued issues.  Recheck CBC and iron level outpatient.  Was on low side of normal last visit.

## 2018-11-09 NOTE — Assessment & Plan Note (Signed)
Chronic, ongoing.  Continue current medication regimen, recommend change to Slow Fe.  Obtain outpatient labs.

## 2018-11-09 NOTE — Assessment & Plan Note (Signed)
CCM referral, will determine whether assistance available to obtain equipment and final testing.

## 2018-11-09 NOTE — Assessment & Plan Note (Addendum)
Productive at this time, suspect related to recent home repairs and allergic rhinitis.  Doxycycline script sent and Flonase.  Recommend use of daily Claritin + may take Coricidin or Mucinex as needed for cough.  Return to office for worsening or continued symptoms.

## 2018-11-09 NOTE — Assessment & Plan Note (Signed)
Chronic, ongoing.  Last A1C 7.4%, outpatient labs ordered.  Continue current medication regimen and adjust as needed based on labs.  Recent GFR 61 and CrCl 58.89.  Continue focus on diet and exercise.  Consider SGLT addition or GLP if continued elevation A1C above goal.  CCM referral placed.  Follow-up in 3 months.

## 2018-11-09 NOTE — Assessment & Plan Note (Signed)
Chronic, ongoing.  Has been at goal at office visits, recommend obtaining BP cuff for home and checking three mornings a week.  Continue current medication regimen.  Outpatient labs ordered.  Follow-up in 3 months.

## 2018-11-10 ENCOUNTER — Ambulatory Visit: Payer: Medicare Other

## 2018-11-10 ENCOUNTER — Ambulatory Visit: Payer: Self-pay | Admitting: Nurse Practitioner

## 2018-11-10 NOTE — Telephone Encounter (Signed)
Pt called in to reschedule her B12 shot and lab draw until Friday due to diarrhea.   [facility] asked that I triage her for the diarrhea.  She started having diarrhea last night.   Been 3 times with watery diarrhea.   No fever.   See triage notes.  I warm transferred the call to [facility] office for further disposition.  Doing virtual visits due to COVID-19 pandemic.  I sent my notes to the office.   Reason for Disposition . [1] MODERATE diarrhea (e.g., 4-6 times / day more than normal) AND [2] age > 70 years    Had diarrhea that started last night.  Been 3 times with watery diarrhea.   Had diarrhea for 2 weeks that went away and now is back.  Answer Assessment - Initial Assessment Questions 1. DIARRHEA SEVERITY: "How bad is the diarrhea?" "How many extra stools have you had in the past 24 hours than normal?"    - NO DIARRHEA (SCALE 0)   - MILD (SCALE 1-3): Few loose or mushy BMs; increase of 1-3 stools over normal daily number of stools; mild increase in ostomy output.   -  MODERATE (SCALE 4-7): Increase of 4-6 stools daily over normal; moderate increase in ostomy output. * SEVERE (SCALE 8-10; OR 'WORST POSSIBLE'): Increase of 7 or more stools daily over normal; moderate increase in ostomy output; incontinence.     For B12 shot today but office wants me to talk with you first.  I've been 3 times during the night. 2. ONSET: "When did the diarrhea begin?"      Last night.   I've had it about 2 weeks a lot then it stopped and now it's started again.   3. BM CONSISTENCY: "How loose or watery is the diarrhea?"      watery 4. VOMITING: "Are you also vomiting?" If so, ask: "How many times in the past 24 hours?"      No 5. ABDOMINAL PAIN: "Are you having any abdominal pain?" If yes: "What does it feel like?" (e.g., crampy, dull, intermittent, constant)      Yes  It's not bad 6. ABDOMINAL PAIN SEVERITY: If present, ask: "How bad is the pain?"  (e.g., Scale 1-10; mild,  moderate, or severe)   - MILD (1-3): doesn't interfere with normal activities, abdomen soft and not tender to touch    - MODERATE (4-7): interferes with normal activities or awakens from sleep, tender to touch    - SEVERE (8-10): excruciating pain, doubled over, unable to do any normal activities       5 on scale.  It's not bad. 7. ORAL INTAKE: If vomiting, "Have you been able to drink liquids?" "How much fluids have you had in the past 24 hours?"     I've had water and tea.  1/2 of a Pepsi 8. HYDRATION: "Any signs of dehydration?" (e.g., dry mouth [not just dry lips], too weak to stand, dizziness, new weight loss) "When did you last urinate?"     No just a headache that's not bad. 9. EXPOSURE: "Have you traveled to a foreign country recently?" "Have you been exposed to anyone with diarrhea?" "Could you have eaten any food that was spoiled?"     No travels or exposures to anyone sick. 10. ANTIBIOTIC USE: "Are you taking antibiotics now or have you taken antibiotics in the past 2 months?"       I take something at night for leg pain gabapentin.  Iron pill. 11. OTHER SYMPTOMS: "Do you have any other symptoms?" (e.g., fever, blood in stool)       No 12. PREGNANCY: "Is there any chance you are pregnant?" "When was your last menstrual period?"       Not asked due to age  Protocols used: DIARRHEA-A-AH

## 2018-11-10 NOTE — Telephone Encounter (Signed)
Spoke to patient on phone, told her to hold her iron tablets for a few days.  Diarrhea started when she started taking iron and we recently discussed she may need to switch to Slow Fe if ongoing diarrhea with iron.  She can reschedule Vitamin B12 shot.  Have recommended if worsening symptoms, such as abdominal pain, N&V, or fever she is to be seen immediately.  She denies any other symptoms at this time.  Was able to verbalize back plan to provider.

## 2018-11-11 ENCOUNTER — Other Ambulatory Visit: Payer: Self-pay

## 2018-11-11 ENCOUNTER — Other Ambulatory Visit: Payer: Medicare Other

## 2018-11-11 ENCOUNTER — Ambulatory Visit (INDEPENDENT_AMBULATORY_CARE_PROVIDER_SITE_OTHER): Payer: Medicare Other

## 2018-11-11 DIAGNOSIS — E538 Deficiency of other specified B group vitamins: Secondary | ICD-10-CM | POA: Diagnosis not present

## 2018-11-11 DIAGNOSIS — IMO0002 Reserved for concepts with insufficient information to code with codable children: Secondary | ICD-10-CM

## 2018-11-11 DIAGNOSIS — E1122 Type 2 diabetes mellitus with diabetic chronic kidney disease: Secondary | ICD-10-CM | POA: Diagnosis not present

## 2018-11-11 DIAGNOSIS — E1165 Type 2 diabetes mellitus with hyperglycemia: Secondary | ICD-10-CM | POA: Diagnosis not present

## 2018-11-11 LAB — MICROALBUMIN, URINE WAIVED
Creatinine, Urine Waived: 200 mg/dL (ref 10–300)
Microalb, Ur Waived: 30 mg/L — ABNORMAL HIGH (ref 0–19)
Microalb/Creat Ratio: <30 mg/g (ref <30)

## 2018-11-11 LAB — BAYER DCA HB A1C WAIVED: HB A1C (BAYER DCA - WAIVED): 6.4 % (ref <7.0)

## 2018-11-12 ENCOUNTER — Ambulatory Visit: Payer: Medicare Other

## 2018-11-12 ENCOUNTER — Telehealth: Payer: Self-pay | Admitting: Nurse Practitioner

## 2018-11-12 LAB — CBC WITH DIFFERENTIAL/PLATELET
Basophils Absolute: 0 10*3/uL (ref 0.0–0.2)
Basos: 1 %
EOS (ABSOLUTE): 0.2 10*3/uL (ref 0.0–0.4)
Eos: 4 %
Hemoglobin: 10.8 g/dL — ABNORMAL LOW (ref 11.1–15.9)
Immature Grans (Abs): 0 10*3/uL (ref 0.0–0.1)
Immature Granulocytes: 0 %
Lymphocytes Absolute: 1.9 10*3/uL (ref 0.7–3.1)
Lymphs: 31 %
MCH: 23.4 pg — ABNORMAL LOW (ref 26.6–33.0)
MCHC: 31.2 g/dL — ABNORMAL LOW (ref 31.5–35.7)
MCV: 75 fL — ABNORMAL LOW (ref 79–97)
Monocytes Absolute: 0.6 10*3/uL (ref 0.1–0.9)
Monocytes: 9 %
Neutrophils Absolute: 3.4 10*3/uL (ref 1.4–7.0)
Neutrophils: 55 %
Platelets: 265 10*3/uL (ref 150–450)
RBC: 4.62 x10E6/uL (ref 3.77–5.28)
RDW: 14.9 % (ref 11.7–15.4)
WBC: 6.1 10*3/uL (ref 3.4–10.8)

## 2018-11-12 LAB — ANEMIA PANEL
Ferritin: 109 ng/mL (ref 15–150)
Folate, Hemolysate: 320 ng/mL
Folate, RBC: 925 ng/mL (ref >498)
Hematocrit: 34.6 % (ref 34.0–46.6)
Iron Saturation: 14 % — ABNORMAL LOW (ref 15–55)
Iron: 38 ug/dL (ref 27–139)
Retic Ct Pct: 1.6 % (ref 0.6–2.6)
Total Iron Binding Capacity: 269 ug/dL (ref 250–450)
UIBC: 231 ug/dL (ref 118–369)
Vitamin B-12: >2000 pg/mL — ABNORMAL HIGH (ref 232–1245)

## 2018-11-12 LAB — BASIC METABOLIC PANEL
BUN/Creatinine Ratio: 16 (ref 12–28)
BUN: 20 mg/dL (ref 8–27)
CO2: 23 mmol/L (ref 20–29)
Calcium: 9.5 mg/dL (ref 8.7–10.3)
Chloride: 101 mmol/L (ref 96–106)
Creatinine, Ser: 1.22 mg/dL — ABNORMAL HIGH (ref 0.57–1.00)
GFR calc Af Amer: 50 mL/min/{1.73_m2} — ABNORMAL LOW (ref >59)
GFR calc non Af Amer: 43 mL/min/{1.73_m2} — ABNORMAL LOW (ref >59)
Glucose: 140 mg/dL — ABNORMAL HIGH (ref 65–99)
Potassium: 4.4 mmol/L (ref 3.5–5.2)
Sodium: 141 mmol/L (ref 134–144)

## 2018-11-12 NOTE — Telephone Encounter (Signed)
Spoke to patient on telephone to review lab results.  H/H is staying at her baseline over past year with a good B12 level, iron continues to be on low side of normal.  She was taking iron supplement recently, but this caused GI upset so this provider advised her to hold for a few days and she is to try Slow Fe which may cause less GI upset.  Kidney function is remaining baseline for her.  Discussed that A1C had major difference, was in 7's, now 6.4.  She endorses major changes in her diet with less sweets and no further sweet tea, is drinking water.  Discussed with her the difference this change has made, she was excited about this.  She does endorse having episodes of dizziness in morning though, as she is this morning.  She has not taken her BS yet this morning.  I advised her to check BS twice a day over next 4 weeks, first thing in morning and then in evening prior to her evening Metformin dose.  Current medication is Metformin XL 1000 MG twice a day.  Discussed with her if BS <70 or 80 she is to hold her Metformin.  Would like to see her in office in 4 weeks and to bring BS log, if tight control will reduce medication.  She stated "I would like that".  Have also recommended if worsening dizziness with other symptoms such as N&V, CP, SOB, she is to be seen right away.

## 2018-11-17 ENCOUNTER — Other Ambulatory Visit: Payer: Self-pay | Admitting: Nurse Practitioner

## 2018-11-17 ENCOUNTER — Other Ambulatory Visit: Payer: Self-pay

## 2018-11-17 ENCOUNTER — Ambulatory Visit (INDEPENDENT_AMBULATORY_CARE_PROVIDER_SITE_OTHER): Payer: Medicare Other | Admitting: Pharmacist

## 2018-11-17 DIAGNOSIS — I1310 Hypertensive heart and chronic kidney disease without heart failure, with stage 1 through stage 4 chronic kidney disease, or unspecified chronic kidney disease: Secondary | ICD-10-CM

## 2018-11-17 DIAGNOSIS — N183 Chronic kidney disease, stage 3 unspecified: Secondary | ICD-10-CM

## 2018-11-17 DIAGNOSIS — E1165 Type 2 diabetes mellitus with hyperglycemia: Secondary | ICD-10-CM | POA: Diagnosis not present

## 2018-11-17 DIAGNOSIS — IMO0002 Reserved for concepts with insufficient information to code with codable children: Secondary | ICD-10-CM

## 2018-11-17 DIAGNOSIS — E1122 Type 2 diabetes mellitus with diabetic chronic kidney disease: Secondary | ICD-10-CM

## 2018-11-17 DIAGNOSIS — G4733 Obstructive sleep apnea (adult) (pediatric): Secondary | ICD-10-CM

## 2018-11-17 MED ORDER — DICLOFENAC SODIUM 1 % TD GEL: 2.0000 g | Freq: Two times a day (BID) | TRANSDERMAL | 2 refills | Status: AC | PRN

## 2018-11-17 NOTE — Telephone Encounter (Signed)
Requested medication (s) are due for refill today: yes  Requested medication (s) are on the active medication list: yes  Last refill:  Metformin ED LR 08/16/18 and   Atorvastatin 20 mg  LR 08/16/2018  Future visit scheduled: yes  Notes to clinic:  Diabetes - biguanides failed  And antilipids statins failed  Requested Prescriptions  Pending Prescriptions Disp Refills   metFORMIN (GLUCOPHAGE-XR) 500 MG 24 hr tablet [Pharmacy Med Name: metFORMIN HCl ER 500 MG Oral Tablet Extended Release 24 Hour] 360 tablet 0    Sig: TAKE 2 TABLETS BY MOUTH TWICE DAILY     Endocrinology:  Diabetes - Biguanides Failed - 11/17/2018  8:15 AM      Failed - Cr in normal range and within 360 days    Creatinine, Ser  Date Value Ref Range Status  11/11/2018 1.22 (H) 0.57 - 1.00 mg/dL Final         Failed - eGFR in normal range and within 360 days    GFR calc Af Amer  Date Value Ref Range Status  11/11/2018 50 (L) >59 mL/min/1.73 Final   GFR calc non Af Amer  Date Value Ref Range Status  11/11/2018 43 (L) >59 mL/min/1.73 Final         Passed - HBA1C is between 0 and 7.9 and within 180 days    Hemoglobin A1C  Date Value Ref Range Status  02/15/2016 7.4%  Final   HB A1C (BAYER DCA - WAIVED)  Date Value Ref Range Status  11/11/2018 6.4 <7.0 % Final    Comment:                                          Diabetic Adult            <7.0                                       Healthy Adult        4.3 - 5.7                                                           (DCCT/NGSP) American Diabetes Association's Summary of Glycemic Recommendations for Adults with Diabetes: Hemoglobin A1c <7.0%. More stringent glycemic goals (A1c <6.0%) may further reduce complications at the cost of increased risk of hypoglycemia.          Passed - Valid encounter within last 6 months    Recent Outpatient Visits          1 week ago Uncontrolled type 2 diabetes mellitus with chronic kidney disease ([place])   [place], Jolene T, NP   1 month ago Radiculopathy, unspecified spinal region   [place], [place] T, NP   3 months ago 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[place])   [place] [name], Jolene T, NP   7 months ago Anemia, unspecified type   [place], Henrine Screws T, NP   1 year ago Mixed hyperlipidemia   [facility] Kathrine Haddock, NP  atorvastatin (LIPITOR) 20 MG tablet [Pharmacy Med Name: Atorvastatin Calcium 20 MG Oral Tablet] 90 tablet 0    Sig: TAKE 1 TABLET BY MOUTH ONCE DAILY     Cardiovascular:  Antilipid - Statins Failed - 11/17/2018  8:15 AM      Failed - LDL in normal range and within 360 days    LDL Calculated  Date Value Ref Range Status  08/11/2018 108 (H) 0 - 99 mg/dL Final         Failed - Triglycerides in normal range and within 360 days    Triglycerides  Date Value Ref Range Status  08/11/2018 215 (H) 0 - 149 mg/dL Final   Triglycerides Piccolo,Waived  Date Value Ref Range Status  11/20/2015 90 <150 mg/dL Final    Comment:                            Normal                   <150                         Borderline High     150 - 199                         High                200 - 499                         Very High                >499          Passed - Total Cholesterol in normal range and within 360 days    Cholesterol, Total  Date Value Ref Range Status  08/11/2018 194 100 - 199 mg/dL Final   Cholesterol Piccolo, Waived  Date Value Ref Range Status  11/20/2015 194 <200 mg/dL Final    Comment:                            Desirable                <200                         Borderline High      200- 239                         High                     >239          Passed - HDL in normal range and within 360 days    HDL  Date Value Ref Range Status  08/11/2018 43 >39 mg/dL Final         Passed - Patient is not pregnant       Passed - Valid encounter within last 12 months    Recent Outpatient Visits          1 week ago Uncontrolled type 2 diabetes mellitus with chronic kidney disease ([place])   [place], Jolene T, NP   1 month ago Radiculopathy, unspecified spinal region   [place], [place] T,  NP   3 months ago 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[place])   [place], [place] T, NP   7 months ago Anemia, unspecified type   [place], [name], NP   1 year ago Mixed hyperlipidemia   [facility] Kathrine Haddock, NP

## 2018-11-17 NOTE — Patient Outreach (Signed)
[facility]) Care Management  11/17/2018  Tina Mcdonald 14-Nov-1940 [id number]   Medication Adherence call to Tina Mcdonald [place] Identifiers Verify spoke with patient she is due on on Atorvastatin 20 mg and Metformin Er 500 mg patient is taking both medication patient has 7 more days on her pill box,she ask if we can call [facility] an order this two medication and also Gabapentin 100 mg,Metoprolol 50 mg and Hctz 25 mg [facility] will fill all of her medication and will call patient once there ready. Tina Mcdonald is showing past due under [place].   [place] Management Direct Dial [phone]  Fax [phone] [email or web address]

## 2018-11-17 NOTE — Chronic Care Management (AMB) (Signed)
**Note De-Identified Tina Obfuscation** Chronic Care Management   Note  11/17/2018 Name: Tina Mcdonald MRN: [id number] DOB: [birth year]   Subjective:  Tina Mcdonald is a 78 y.o. year old female who is a primary care patient of [name], [name], NP. The CCM team was consulted for assistance with chronic disease management and care coordination needs.    Contacted patient to discuss CCM program and review medications.   Tina Mcdonald was given information about Chronic Care Management services today including:  1. CCM service includes personalized support from designated clinical staff supervised by her physician, including individualized plan of care and coordination with other care providers 2. 24/7 contact phone numbers for assistance for urgent and routine care needs. 3. Service will only be billed when office clinical staff spend 20 minutes or more in a month to coordinate care. 4. Only one practitioner may furnish and bill the service in a calendar month. 5. The patient may stop CCM services at any time (effective at the end of the month) by phone call to the office staff. 6. The patient will be responsible for cost sharing (co-pay) of up to 20% of the service fee (after annual deductible is met).  Patient agreed to services and verbal consent obtained.   Review of patient status, including review of consultants reports, laboratory and other test data, was performed as part of comprehensive evaluation and provision of chronic care management services.   Objective:  Lab Results  Component Value Date   CREATININE 1.22 (H) 11/11/2018   CREATININE 1.03 (H) 08/11/2018   CREATININE 0.98 10/28/2017    Lab Results  Component Value Date   HGBA1C 6.4 11/11/2018       Component Value Date/Time   CHOL 194 08/11/2018 1605   CHOL 194 11/20/2015 1036   TRIG 215 (H) 08/11/2018 1605   TRIG 90 11/20/2015 1036   HDL 43 08/11/2018 1605   VLDL 18 11/20/2015 1036   LDLCALC 108 (H) 08/11/2018 1605    Clinical ASCVD: No   The 10-year ASCVD risk score Mikey Bussing DC Jr., et al., 2013) is: 33.6%   Values used to calculate the score:     Age: 54 years     Sex: Female     Is Non-Hispanic African American: Yes     Diabetic: Yes     Tobacco smoker: No     Systolic Blood Pressure: 902 mmHg     Is BP treated: Yes     HDL Cholesterol: 43 mg/dL     Total Cholesterol: 194 mg/dL    BP Readings from Last 3 Encounters:  09/22/18 139/75  09/16/18 117/61  08/11/18 127/77    Allergies  Allergen Reactions  . Contrast Media [Iodinated Diagnostic Agents] Itching  . Penicillins Itching    Has patient had a PCN reaction causing immediate rash, facial/tongue/throat swelling, SOB or lightheadedness with hypotension: Yes Has patient had a PCN reaction causing severe rash involving mucus membranes or skin necrosis: Unknown Has patient had a PCN reaction that required hospitalization: Yes Has patient had a PCN reaction occurring within the last 10 years: Yes If all of the above answers are "NO", then may proceed with Cephalosporin use.  . Sulfa Antibiotics Itching  . Latex Rash  . Neosporin [Neomycin-Bacitracin Zn-Polymyx] Itching and Rash  . Other Rash    Pt has reaction to high acidity foods.     Medications Reviewed Today    Reviewed by De Hollingshead, [facility] (Pharmacist) on 11/17/18 at 1145  Med List Status: <None>  Medication Order Taking? Sig Documenting Provider Last Dose Status Informant  acetaminophen (TYLENOL) 325 MG tablet [id number] Yes Take 650 mg by mouth every 6 (six) hours as needed. [provider] Taking Active   atorvastatin (LIPITOR) 20 MG tablet [id number] Yes TAKE 1 TABLET BY MOUTH ONCE DAILY [name], Jolene T, NP Taking Active   blood glucose meter kit and supplies KIT [id number] Yes Dispense based on patient and insurance preference. Use up to four times daily as directed. (FOR ICD-9 250.00, 250.01). Marnee Guarneri T, NP Taking Active   cloNIDine (CATAPRES) 0.1 MG tablet [id number] Yes Take 0.1  mg by mouth 2 (two) times daily. [provider] Taking Active Self  cyanocobalamin ((VITAMIN B-12)) injection 1,000 mcg [id number]   Kathrine Haddock, NP  Active   diclofenac sodium (VOLTAREN) 1 % GEL [id number] No  [provider] Not Taking Active   ferrous sulfate 325 (65 FE) MG EC tablet [id number] Yes Take 325 mg by mouth daily. [provider] Taking Active   fluticasone (FLONASE) 50 MCG/ACT nasal spray [id number] No Place 2 sprays into both nostrils daily.  Patient not taking:  Reported on 11/17/2018   Marnee Guarneri T, NP Not Taking Active   gabapentin (NEURONTIN) 100 MG capsule [id number] Yes Take 3 capsules (300 mg total) by mouth at bedtime. Marnee Guarneri T, NP Taking Active   glucose blood test strip [id number] Yes Check daily. ICD 10 E11.9 Kathrine Haddock, NP Taking Active Self  hydrochlorothiazide (HYDRODIURIL) 25 MG tablet [id number] Yes TAKE 1 TABLET BY MOUTH ONCE DAILY [name], Jolene T, NP Taking Active   metFORMIN (GLUCOPHAGE-XR) 500 MG 24 hr tablet [id number] Yes TAKE 2 TABLETS BY MOUTH TWICE DAILY [name], Jolene T, NP Taking Active   metoprolol succinate (TOPROL-XL) 50 MG 24 hr tablet [id number] Yes Take 1 tablet by mouth once daily [name], Jolene T, NP Taking Active   omeprazole (PRILOSEC) 20 MG capsule [id number] Yes TAKE 1 CAPSULE BY MOUTH ONCE DAILY [name], Jolene T, NP Taking Active          Assessment:   Goals Addressed            This Visit's Progress     Patient Stated   . "I want to take care of my medications" (pt-stated)       Current Barriers:  . Polypharmacy; patient on multiple medications for chronic disease state management  o Reports hx diarrhea with iron supplementation; Marnee Guarneri, NP recommended Slow Fe formulation; patient was unable to find Slow Fe 65 mg, so has continued ferrous sulfate 65 mg. Notes that diarrhea has improved significantly  o Reports arthritis + "leg pain"- describes as her leg feeling "tight" and  "achy", but denies holding onto fluid. Spoke with PCP about this previously, but jointly decided to hold off on therapy referral until later after coronavirus pandemic. Notes that she manages with acetaminophen 1500 mg daily (3 tabs at once) and diclofenac gel, but needs a refill on gel. Notes hx NSAID use for this, but that she knows PO NSAIDS can be difficult on her kidneys, so she has avoided o Describes continued allergic rhinitis symptoms; is not taking a daily antihistamine or nasal fluticasone as prescribed by [name] at last appointment o T2DM; patient has made significant diet/lifestyle changes over the past few months to result in A1c improvement to 6.4% w/ metformin XR 1000 mg BID. Denies episodes of hypoglycemia; reports fasting BG 104-124  Pharmacist Clinical Goal(s):  [name] Over the next 30 days, patient will work  with PharmD and primary care provider to address needs related to optimized medication management of chronic conditions  Interventions: . Comprehensive medication review performed.  . Encouraged patient that Slow Fe formulation only comes as 45 mg; would be appropriate to either continue current ferrous sulfate 65 mg daily, or switch to Slow Fe 45 mg, depending on patient preference. She notes she just bought more ferrous sulfate 65 mg, so will plan to continue to use this for now as diarrhea has improved  . Recommended to patient to trial acetaminophen 2 tabs (1000 mg) BID, instead of taking 3 tabs once daily. Discussed duration of action of acetaminophen; counseled to avoid acetaminophen doses >4 g daily in total. Also informed patient that diclofenac gel is now OTC; she noted she would pick some up at [facility] this week . Explained benefit of daily antihistamines w/ or w/o nasal steroids; encouraged patient to try a few weeks of Zyrtec/Claritin, and if allergic symptoms persist (productive cough, nasal drainage, runny nose, etc) to consider adding nasal fluticasone daily. Explained  to use these products proactively prior to allergy seasons in the future . Congratulated patient on A1c success. Encouraged to keep up the good work, and to contact me/clinic if BG <90 begins to occur regularly; would consider metformin dose reduction at that time  Patient Self Care Activities:  . Self administers medications as prescribed . Calls provider office for new concerns or questions  Initial goal documentation     . "I'm dizzy and groggy in the mornings" (pt-stated)       Current Barriers:  . Upon review of medication review, patient confirmed that she often feels dizzy, sedated, and groggy in the mornings, and this seems to have happened after initiation of gabapentin therapy. She asks if she could try less gabapentin.  . Clonidine may also be contributing to CNS sedation; followed by [facility] cardiology Dr. Clayborn Bigness . Patient diagnosed with sleep apnea; has undergone testing, but unable to afford CPAP supplies at this time  Pharmacist Clinical Goal(s):  [name] Over the next 30 days, patient will work with CCM team to address needs related to reduction in dizziness, risk of falls  Interventions: . Comprehensive medication review performed.  . Agreed that patient could try gabapentin 200 mg (2 capsules) QHS for a week to see if pain control is maintained with less next-morning side effects. Patient verbalized understanding.  . Will collaborate with CCM CM RN Janci Minor to determine if any help for CPAP supplies. Untreated OSA could contribute to next-morning drowsiness  Patient Self Care Activities:  . Self administers medications as prescribed . Calls provider office for new concerns or questions  Initial goal documentation        Plan - PharmD will collaborate with CM RN for support with CPAP supplies and OSA treatment - PharmD will follow up with patient in 2-3 weeks to evaluate dizziness/grogginess  Catie Darnelle Maffucci, PharmD Clinical Pharmacist [place] [phone]

## 2018-11-17 NOTE — Telephone Encounter (Signed)
Requested medication (s) are due for refill today: yes  Requested medication (s) are on the active medication list: yes  Last refill:  08/16/18  Future visit scheduled: no  Notes to clinic:  Thiazide failed  Requested Prescriptions  Pending Prescriptions Disp Refills   hydrochlorothiazide (HYDRODIURIL) 25 MG tablet [Pharmacy Med Name: hydroCHLOROthiazide 25 MG Oral Tablet] 90 tablet 0    Sig: Take 1 tablet by mouth once daily     Cardiovascular: Diuretics - Thiazide Failed - 11/17/2018  2:19 PM      Failed - Cr in normal range and within 360 days    Creatinine, Ser  Date Value Ref Range Status  11/11/2018 1.22 (H) 0.57 - 1.00 mg/dL Final         Passed - Ca in normal range and within 360 days    Calcium  Date Value Ref Range Status  11/11/2018 9.5 8.7 - 10.3 mg/dL Final         Passed - K in normal range and within 360 days    Potassium  Date Value Ref Range Status  11/11/2018 4.4 3.5 - 5.2 mmol/L Final         Passed - Na in normal range and within 360 days    Sodium  Date Value Ref Range Status  11/11/2018 141 134 - 144 mmol/L Final         Passed - Last BP in normal range    BP Readings from Last 1 Encounters:  09/22/18 139/75         Passed - Valid encounter within last 6 months    Recent Outpatient Visits          1 week ago Uncontrolled type 2 diabetes mellitus with chronic kidney disease ([place])   [place], Jolene T, NP   1 month ago Radiculopathy, unspecified spinal region   [employer], [place] T, NP   3 months ago 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[place])   [place], Jolene T, NP   7 months ago Anemia, unspecified type   [employer], [place] T, NP   1 year ago Mixed hyperlipidemia   [facility] Kathrine Haddock, NP           Signed Prescriptions Disp Refills   metoprolol succinate (TOPROL-XL) 50 MG 24 hr tablet 90  tablet 0    Sig: Take 1 tablet by mouth once daily     Cardiovascular:  Beta Blockers Passed - 11/17/2018  2:19 PM      Passed - Last BP in normal range    BP Readings from Last 1 Encounters:  09/22/18 139/75         Passed - Last Heart Rate in normal range    Pulse Readings from Last 1 Encounters:  09/22/18 92         Passed - Valid encounter within last 6 months    Recent Outpatient Visits          1 week ago Uncontrolled type 2 diabetes mellitus with chronic kidney disease ([place])   [place], Jolene T, NP   1 month ago Radiculopathy, unspecified spinal region   [place], [place] T, NP   3 months ago 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[place])   [place], Jolene T, NP   7 months ago Anemia, unspecified type   [facility]  Marnee Guarneri T, NP   1 year ago Mixed hyperlipidemia   [facility] Kathrine Haddock, NP           Refused Prescriptions Disp Refills   atorvastatin (LIPITOR) 20 MG tablet [Pharmacy Med Name: Atorvastatin Calcium 20 MG Oral Tablet] 90 tablet 0    Sig: Take 1 tablet by mouth once daily     Cardiovascular:  Antilipid - Statins Failed - 11/17/2018  2:19 PM      Failed - LDL in normal range and within 360 days    LDL Calculated  Date Value Ref Range Status  08/11/2018 108 (H) 0 - 99 mg/dL Final         Failed - Triglycerides in normal range and within 360 days    Triglycerides  Date Value Ref Range Status  08/11/2018 215 (H) 0 - 149 mg/dL Final   Triglycerides Piccolo,Waived  Date Value Ref Range Status  11/20/2015 90 <150 mg/dL Final    Comment:                            Normal                   <150                         Borderline High     150 - 199                         High                200 - 499                         Very High                >499          Passed - Total Cholesterol in normal range and  within 360 days    Cholesterol, Total  Date Value Ref Range Status  08/11/2018 194 100 - 199 mg/dL Final   Cholesterol Piccolo, Waived  Date Value Ref Range Status  11/20/2015 194 <200 mg/dL Final    Comment:                            Desirable                <200                         Borderline High      200- 239                         High                     >239          Passed - HDL in normal range and within 360 days    HDL  Date Value Ref Range Status  08/11/2018 43 >39 mg/dL Final         Passed - Patient is not pregnant      Passed - Valid encounter within last 12 months    Recent Outpatient Visits          1 week ago Uncontrolled  type 2 diabetes mellitus with chronic kidney disease ([place])   [place], Henrine Screws T, NP   1 month ago Radiculopathy, unspecified spinal region   [place], [place] T, NP   3 months ago 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

## 2018-11-17 NOTE — Patient Instructions (Signed)
Visit Information  Goals Addressed            This Visit's Progress     Patient Stated   . "I want to take care of my medications" (pt-stated)       Current Barriers:  . Polypharmacy; patient on multiple medications for chronic disease state management  o Reports hx diarrhea with iron supplementation; Marnee Guarneri, NP recommended Slow Fe formulation; patient was unable to find Slow Fe 65 mg, so has continued ferrous sulfate 65 mg. Notes that diarrhea has improved significantly  o Reports arthritis + "leg pain"- describes as her leg feeling "tight" and "achy", but denies holding onto fluid. Spoke with PCP about this previously, but jointly decided to hold off on therapy referral until later after coronavirus pandemic. Notes that she manages with acetaminophen 1500 mg daily (3 tabs at once) and diclofenac gel, but needs a refill on gel. Notes hx NSAID use for this, but that she knows PO NSAIDS can be difficult on her kidneys, so she has avoided o Describes continued allergic rhinitis symptoms; is not taking a daily antihistamine or nasal fluticasone as prescribed by [name] at last appointment o T2DM; patient has made significant diet/lifestyle changes over the past few months to result in A1c improvement to 6.4% w/ metformin XR 1000 mg BID. Denies episodes of hypoglycemia; reports fasting BG 104-124  Pharmacist Clinical Goal(s):  [name] Over the next 30 days, patient will work with PharmD and primary care provider to address needs related to optimized medication management of chronic conditions  Interventions: . Comprehensive medication review performed.  . Encouraged patient that Slow Fe formulation only comes as 45 mg; would be appropriate to either continue current ferrous sulfate 65 mg daily, or switch to Slow Fe 45 mg, depending on patient preference. She notes she just bought more ferrous sulfate 65 mg, so will plan to continue to use this for now as diarrhea has improved  . Recommended to  patient to trial acetaminophen 2 tabs (1000 mg) BID, instead of taking 3 tabs once daily. Discussed duration of action of acetaminophen; counseled to avoid acetaminophen doses >4 g daily in total. Also informed patient that diclofenac gel is now OTC; she noted she would pick some up at [facility] this week . Explained benefit of daily antihistamines w/ or w/o nasal steroids; encouraged patient to try a few weeks of Zyrtec/Claritin, and if allergic symptoms persist (productive cough, nasal drainage, runny nose, etc) to consider adding nasal fluticasone daily. Explained to use these products proactively prior to allergy seasons in the future . Congratulated patient on A1c success. Encouraged to keep up the good work, and to contact me/clinic if BG <90 begins to occur regularly; would consider metformin dose reduction at that time  Patient Self Care Activities:  . Self administers medications as prescribed . Calls provider office for new concerns or questions  Initial goal documentation     . "I'm dizzy and groggy in the mornings" (pt-stated)       Current Barriers:  . Upon review of medication review, patient confirmed that she often feels dizzy, sedated, and groggy in the mornings, and this seems to have happened after initiation of gabapentin therapy. She asks if she could try less gabapentin.  . Clonidine may also be contributing to CNS sedation; followed by [facility] cardiology Dr. Clayborn Bigness . Patient diagnosed with sleep apnea; has undergone testing, but unable to afford CPAP supplies at this time  Pharmacist Clinical Goal(s):  [name] Over the  next 30 days, patient will work with CCM team to address needs related to reduction in dizziness, risk of falls  Interventions: . Comprehensive medication review performed.  . Agreed that patient could try gabapentin 200 mg (2 capsules) QHS for a week to see if pain control is maintained with less next-morning side effects. Patient verbalized  understanding.  . Will collaborate with CCM CM RN Janci Minor to determine if any help for CPAP supplies. Untreated OSA could contribute to next-morning drowsiness  Patient Self Care Activities:  . Self administers medications as prescribed . Calls provider office for new concerns or questions  Initial goal documentation        The patient verbalized understanding of instructions provided today and declined a print copy of patient instruction materials.   Plan - PharmD will collaborate with CM RN for support with CPAP supplies and OSA treatment - PharmD will follow up with patient in 2-3 weeks to evaluate dizziness/grogginess  Catie Darnelle Maffucci, PharmD Clinical Pharmacist [place] [phone]

## 2018-11-30 ENCOUNTER — Encounter: Payer: Self-pay | Admitting: Gastroenterology

## 2018-11-30 ENCOUNTER — Other Ambulatory Visit: Payer: Self-pay

## 2018-11-30 ENCOUNTER — Ambulatory Visit (INDEPENDENT_AMBULATORY_CARE_PROVIDER_SITE_OTHER): Payer: Medicare Other | Admitting: Gastroenterology

## 2018-11-30 VITALS — BP 128/75 | HR 93 | Temp 98.9°F | Ht 62.0 in | Wt 173.2 lb

## 2018-11-30 DIAGNOSIS — K219 Gastro-esophageal reflux disease without esophagitis: Secondary | ICD-10-CM | POA: Diagnosis not present

## 2018-11-30 DIAGNOSIS — D509 Iron deficiency anemia, unspecified: Secondary | ICD-10-CM

## 2018-11-30 DIAGNOSIS — D508 Other iron deficiency anemias: Secondary | ICD-10-CM | POA: Diagnosis not present

## 2018-11-30 NOTE — Progress Notes (Signed)
Tina Mcdonald [address]  [place]  [place], [place] [address]  Main: [phone]  Fax: [phone]   Gastroenterology Consultation  Referring Provider:     [name], NP Primary Care Physician:  [name], NP Reason for Consultation:     GERD        HPI:   CC: Cough  Tina Mcdonald is a 78 y.o. y/o female referred for consultation & management  by Dr. [name], NP.  Patient referred to [place] by primary care provider to control GERD despite omeprazole.  Patient states she used to have heartburn and reflux but this is well controlled with omeprazole.  However, she reports coughing spells, 30 minutes after eating that happens for 2 to 3 months out of the year.  These do not occur when laying down, and only when eating.  Her history is somewhat unreliable.  She had a swallow evaluation by speech pathology which was normal in October 2019 and a modified barium swallow that reported delayed oral transit with thin liquid, otherwise normal exam.  Speech pathology note states no oropharyngeal dysphagia.  When asked if dysphagia was the reason for her swallow evaluation, her answer is somewhat vague.  She states she is not having those symptoms now but may have had it in the past.  Denies any abdominal pain, nausea vomiting, altered bowel habits, blood in stool.  No family history of colon cancer.  Patient also has microcytic anemia, with lowest ferritin at 86, but low iron saturation.  Patient is on iron replacement by primary care provider.  No prior EGD  Last colonoscopy June 2017 for screening, by Dr. Allen Norris, extent of exam cecum, excellent prep, sigmoid polyp removed and showed hyperplastic polyp.  Diverticulosis and nonbleeding internal hemorrhoids noted.  Past Medical History:  Diagnosis Date  . Anemia   . Arthritis   . Chronic kidney disease   . Colon polyps   . Diabetes mellitus without complication ([place])   . Diabetic neuropathy ([place])   .  GERD (gastroesophageal reflux disease)   . Hyperlipemia   . Hypertension   . Osteoarthritis   . Radiculopathy   . Sleep apnea    does not have her cpap anymore  . Stress incontinence   . Urticaria   . Vitamin B 12 deficiency   . Wears glasses     Past Surgical History:  Procedure Laterality Date  . ABDOMINAL HYSTERECTOMY    . APPENDECTOMY    . BREAST BIOPSY Right    right-neg  . BREAST BIOPSY Left    us/bx/clip-neg  . CARPAL TUNNEL RELEASE     right  . COLONOSCOPY    . COLONOSCOPY WITH PROPOFOL N/A 12/10/2015   Procedure: COLONOSCOPY WITH PROPOFOL;  Surgeon: Lucilla Lame, MD;  Location: [place] [facility];  Service: Endoscopy;  Laterality: N/A;  Diabetic - oral meds Sleep Apnea LATEX allergy  . CYSTOSCOPY    . DILATION AND CURETTAGE OF UTERUS    . EYE SURGERY Bilateral    lens implant  . LUMBAR FUSION  2009  . POLYPECTOMY  12/10/2015   Procedure: POLYPECTOMY;  Surgeon: Lucilla Lame, MD;  Location: [place];  Service: Endoscopy;;  . RESECTION DISTAL CLAVICAL Right 11/25/2013   Procedure: RIGHT SHOULDER OPEN RESECTION DISTAL CLAVICAL EXCISION SOFT TISSUE TUMOR SHOULDER DEEP SUBFASCIAL INTRAMUSCULAR/DEBRIDEMENT/ROTATOR CUFF REPAIR/BICEPS TENODESIS/MANIPULATION;  Surgeon: Renette Butters, MD;  Location: [place];  Service: Orthopedics;  Laterality: Right;  . SHOULDER ARTHROSCOPY  WITH ROTATOR CUFF REPAIR AND SUBACROMIAL DECOMPRESSION Right 11/25/2013   Procedure: SHOULDER ARTHROSCOPY WITH DEBRIDEMENT ROTATOR CUFF REPAIR  A;  Surgeon: Renette Butters, MD;  Location: [place];  Service: Orthopedics;  Laterality: Right;    Prior to Admission medications   Medication Sig Start Date End Date Taking? Authorizing Provider  acetaminophen (TYLENOL) 325 MG tablet Take 650 mg by mouth every 6 (six) hours as needed.    [provider]  atorvastatin (LIPITOR) 20 MG tablet Take 1 tablet by mouth once daily 11/17/18   [name], Henrine Screws T, NP   blood glucose meter kit and supplies KIT Dispense based on patient and insurance preference. Use up to four times daily as directed. (FOR ICD-9 250.00, 250.01). 09/22/18   [name], Henrine Screws T, NP  cloNIDine (CATAPRES) 0.1 MG tablet Take 0.1 mg by mouth 2 (two) times daily.    [provider]  diclofenac sodium (VOLTAREN) 1 % GEL Apply 2 g topically 2 (two) times daily as needed. 11/17/18   [name], Henrine Screws T, NP  ferrous sulfate 325 (65 FE) MG EC tablet Take 325 mg by mouth daily.    [provider]  fluticasone (FLONASE) 50 MCG/ACT nasal spray Place 2 sprays into both nostrils daily. Patient not taking: Reported on 11/17/2018 11/09/18   Marnee Guarneri T, NP  gabapentin (NEURONTIN) 100 MG capsule Take 3 capsules (300 mg total) by mouth at bedtime. 09/22/18   [name], Henrine Screws T, NP  glucose blood test strip Check daily. ICD 10 E11.9 01/16/15   Kathrine Haddock, NP  hydrochlorothiazide (HYDRODIURIL) 25 MG tablet Take 1 tablet by mouth once daily 11/17/18   [name], Henrine Screws T, NP  metFORMIN (GLUCOPHAGE-XR) 500 MG 24 hr tablet Take 2 tablets by mouth twice daily 11/17/18   [name], Henrine Screws T, NP  metoprolol succinate (TOPROL-XL) 50 MG 24 hr tablet Take 1 tablet by mouth once daily 11/17/18   [name], Jolene T, NP  omeprazole (PRILOSEC) 20 MG capsule TAKE 1 CAPSULE BY MOUTH ONCE DAILY 08/16/18   Marnee Guarneri T, NP    Family History  Problem Relation Age of Onset  . Heart disease Mother   . Diabetes Mother   . Hypertension Mother   . Heart disease Father   . Hypertension Father   . Diabetes Sister   . Hypertension Sister   . Heart disease Sister   . Diabetes Sister   . Hypertension Sister   . Heart disease Son 75  . Breast cancer Cousin 31     Social History   Tobacco Use  . Smoking status: Former Smoker    Last attempt to quit: 11/22/1997    Years since quitting: 21.0  . Smokeless tobacco: Never Used  Substance Use Topics  . Alcohol use: No  . Drug use: No    Allergies as of  11/30/2018 - Review Complete 11/17/2018  Allergen Reaction Noted  . Contrast media [iodinated diagnostic agents] Itching 11/22/2013  . Penicillins Itching 11/22/2013  . Sulfa antibiotics Itching 11/22/2013  . Latex Rash 11/25/2013  . Neosporin [neomycin-bacitracin zn-polymyx] Itching and Rash 11/22/2013  . Other Rash 02/05/2017    Review of Systems:    All systems reviewed and negative except where noted in HPI.   Physical Exam:  LMP  (LMP Unknown)  No LMP recorded (lmp unknown). Patient has had a hysterectomy.  Vitals:   11/30/18 1327  BP: 128/75  Pulse: 93  Temp: 98.9 F (37.2 C)  TempSrc: Oral  Weight: 173 lb 3.2 oz (  78.6 kg)  Height: '5\' 2"'  (1.575 m)    Psych:  Alert and cooperative. Normal mood and affect. General:   Alert,  Well-developed, well-nourished, pleasant and cooperative in NAD Head:  Normocephalic and atraumatic. Eyes:  Sclera clear, no icterus.   Conjunctiva pink. Ears:  Normal auditory acuity. Nose:  No deformity, discharge, or lesions. Mouth:  No deformity or lesions,oropharynx pink & moist. Neck:  Supple; no masses or thyromegaly. Abdomen:  Normal bowel sounds.  No bruits.  Soft, non-tender and non-distended without masses, hepatosplenomegaly or hernias noted.  No guarding or rebound tenderness.    Msk:  Symmetrical without gross deformities. Good, equal movement & strength bilaterally. Pulses:  Normal pulses noted. Extremities:  No clubbing or edema.  No cyanosis. Neurologic:  Alert and oriented x3;  grossly normal neurologically. Skin:  Intact without significant lesions or rashes. No jaundice. Lymph Nodes:  No significant cervical adenopathy. Psych:  Alert and cooperative. Normal mood and affect.   Labs: CBC    Component Value Date/Time   WBC 6.1 11/11/2018 1540   WBC 6.0 02/05/2017 1719   RBC 4.62 11/11/2018 1540   RBC 4.66 02/05/2017 1719   HGB 10.8 (L) 11/11/2018 1540   HCT 34.6 11/11/2018 1540   PLT 265 11/11/2018 1540   MCV 75 (L)  11/11/2018 1540   MCH 23.4 (L) 11/11/2018 1540   MCH 23.6 (L) 02/05/2017 1719   MCHC 31.2 (L) 11/11/2018 1540   MCHC 32.5 02/05/2017 1719   RDW 14.9 11/11/2018 1540   LYMPHSABS 1.9 11/11/2018 1540   EOSABS 0.2 11/11/2018 1540   BASOSABS 0.0 11/11/2018 1540   CMP     Component Value Date/Time   NA 141 11/11/2018 1540   K 4.4 11/11/2018 1540   CL 101 11/11/2018 1540   CO2 23 11/11/2018 1540   GLUCOSE 140 (H) 11/11/2018 1540   GLUCOSE 132 (H) 02/05/2017 1719   BUN 20 11/11/2018 1540   CREATININE 1.22 (H) 11/11/2018 1540   CALCIUM 9.5 11/11/2018 1540   PROT 7.0 08/11/2018 1605   ALBUMIN 4.5 08/11/2018 1605   AST 15 08/11/2018 1605   ALT 14 08/11/2018 1605   ALKPHOS 71 08/11/2018 1605   BILITOT 0.3 08/11/2018 1605   GFRNONAA 43 (L) 11/11/2018 1540   GFRAA 50 (L) 11/11/2018 1540    Imaging Studies: No results found.  Assessment and Plan:   Tina Mcdonald is a 78 y.o. y/o female has been referred for GERD  Patient coughing spell 30 minutes after eating may or may not be related to GERD  Patient educated extensively on acid reflux lifestyle modification, including buying a bed wedge, not eating 3 hrs before bedtime, diet modifications, and handout given for the same.   However, due to prolonged PPI use and above vague symptoms, EGD would help rule out Barrett's, esophagitis, or any other underlying lesions, especially since there is a question of previous dysphagia  In addition, microcytic anemia was noted. Patient will be referred to hematology for further evaluation of this  In addition, we discussed colonoscopy along with EGD due to her anemia patient is agreeable  I have discussed alternative options, risks & benefits,  which include, but are not limited to, bleeding, infection, perforation,respiratory complication & drug reaction.  The patient agrees with this plan & written consent will be obtained.       Dr Tina Mcdonald  Speech recognition software was  used to dictate the above note.

## 2018-12-03 ENCOUNTER — Ambulatory Visit: Payer: Self-pay | Admitting: *Deleted

## 2018-12-03 DIAGNOSIS — M5410 Radiculopathy, site unspecified: Secondary | ICD-10-CM

## 2018-12-03 DIAGNOSIS — G4733 Obstructive sleep apnea (adult) (pediatric): Secondary | ICD-10-CM

## 2018-12-04 NOTE — Patient Instructions (Signed)
Thank you allowing the Chronic Care Management Team to be a part of your care! It was a pleasure speaking with you today!   CCM (Chronic Care Management) Team   Shamiyah Ngu RN, BSN Nurse Care Coordinator  [phone]  [facility] PharmD  Clinical Pharmacist  [phone]  Eula Fried LCSW Clinical Social Worker [phone]  Goals Addressed            This Visit's Progress   . I can't afford my c-pap supplies (pt-stated)       Current Barriers:  [name] Knowledge Deficits related to on how to obtain cpap and supplies affordably  . Film/video editor.  . Difficulty navigating the Sleep Study process  Nurse Case Manager Clinical Goal(s):  [name] Over the next 90 days, patient will work with [facility] to address needs related to OSA  Interventions:  . Collaborated with [facility] regarding patient's last sleep study in 2018. Spoke with AJ [phone]) he states patient still needs to complete more of the sleep study process and this would be further expense upwards of 250-300$ related to insurance is 80/20 for Cpap and supplies. AJ states if patient would like to use last performed sleep study PCP would need to fill out a form he could provide.  . Discussed plans with patient for ongoing care management follow up and provided patient with direct contact information for care management team . Plan to discuss with patient options related to OSA/Sleep study at next contact  Patient Self Care Activities:  . Currently UNABLE TO independently navigate sleep study process to obtain cpap supplies  Initial goal documentation     . I haven't  been able to make my leg pain appointment (pt-stated)       Current Barriers:  [name] Knowledge Deficits related to obtaining appointment for leg/back pain . Referral sent to [facility] April 2020 patient was unable to remember the name of the office and did not have call back number  Nurse Case Manager Clinical Goal(s):  [name] Over  the next 90 days, patient will work with [facility] to address needs related to f/u with [facility] for leg/back pain.  Interventions:  . Advised patient to to call [facility] to set up appointment . Collaborated with Barbaraann Share with [facility] regarding patient's referral, she stated they had called patient and did not receive a call back yet. . Discussed plans with patient for ongoing care management follow up and provided patient with direct contact information for care management team  Patient Self Care Activities:  . Currently UNABLE TO independently navigate health system to set up appointment for leg/back pain  Initial goal documentation        The patient verbalized understanding of instructions provided today and declined a print copy of patient instruction materials.   The care management team will reach out to the patient again over the next 14 days.  The patient has been provided with contact information for the care management team and has been advised to call with any health related questions or concerns.

## 2018-12-04 NOTE — Chronic Care Management (AMB) (Signed)
  Chronic Care Management   Follow Up Note   12/04/2018 Name: BEV DRENNEN MRN: [id number] DOB: 10-Sep-1940  Referred by: [name], NP Reason for referral : Chronic Care Management (DM, CKD, HTN) and Care Coordination (cpap supplies )   AJWA KIMBERLEY is a 78 y.o. year old female who is a primary care patient of [name], [name], NP. The CCM team was consulted for assistance with chronic disease management and care coordination needs.    Review of patient status, including review of consultants reports, relevant laboratory and other test results, and collaboration with appropriate care team members and the patient's provider was performed as part of comprehensive patient evaluation and provision of chronic care management services.    Goals Addressed            This Visit's Progress   . I can't afford my c-pap supplies (pt-stated)       Current Barriers:  [name] Knowledge Deficits related to on how to obtain cpap and supplies affordably  . Film/video editor.  . Difficulty navigating the Sleep Study process  Nurse Case Manager Clinical Goal(s):  [name] Over the next 90 days, patient will work with [facility] to address needs related to OSA  Interventions:  . Collaborated with [facility] regarding patient's last sleep study in 2018. Spoke with AJ [phone]) he states patient still needs to complete more of the sleep study process and this would be further expense upwards of 250-300$ related to insurance is 80/20 for Cpap and supplies. AJ states if patient would like to use last performed sleep study PCP would need to fill out a form he could provide.  . Discussed plans with patient for ongoing care management follow up and provided patient with direct contact information for care management team . Plan to discuss with patient options related to OSA/Sleep study at next contact  Patient Self Care Activities:  . Currently UNABLE TO independently navigate sleep  study process to obtain cpap supplies  Initial goal documentation     . I haven't  been able to make my leg pain appointment (pt-stated)       Current Barriers:  [name] Knowledge Deficits related to obtaining appointment for leg/back pain . Referral sent to [facility] April 2020 patient was unable to remember the name of the office and did not have call back number  Nurse Case Manager Clinical Goal(s):  [name] Over the next 90 days, patient will work with [facility] to address needs related to f/u with [facility] for leg/back pain.  Interventions:  . Advised patient to to call [facility] to set up appointment . Collaborated with Barbaraann Share with [facility] regarding patient's referral, she stated they had called patient and did not receive a call back yet. . Discussed plans with patient for ongoing care management follow up and provided patient with direct contact information for care management team  Patient Self Care Activities:  . Currently UNABLE TO independently navigate health system to set up appointment for leg/back pain  Initial goal documentation         The care management team will reach out to the patient again over the next 14 days.  The patient has been provided with contact information for the care management team and has been advised to call with any health related questions or concerns.    Merlene Morse Debbera Wolken RN, BSN Nurse Case Editor, commissioning [facility] Care Management  [phone]) Business Mobile

## 2018-12-08 ENCOUNTER — Ambulatory Visit: Payer: Medicare Other | Admitting: Gastroenterology

## 2018-12-09 ENCOUNTER — Telehealth: Payer: Self-pay | Admitting: Nurse Practitioner

## 2018-12-09 ENCOUNTER — Ambulatory Visit: Payer: Self-pay

## 2018-12-09 NOTE — Telephone Encounter (Signed)
Copied from [place] [id number]. Topic: General - Call Back - No Documentation >> Dec 09, 2018  4:22 PM Erick Blinks wrote: Reason for CRM: Pt called inquiring about Metformin XR. Please advise

## 2018-12-09 NOTE — Telephone Encounter (Signed)
Will have patient call pharmacy and inquire, as specific lot numbers involved and pharmacist will be able to direct further.

## 2018-12-09 NOTE — Telephone Encounter (Signed)
Call place to patient. She states she has been notified by pharmacy that the Metformin she takes has been recalled. She was wondering if she should continue to take the medication. Pt was advised to never stop a medication without her doctors advice. Call was placed to office. FC states that Jolene has been made aware and pt should continue medication until advised otherwise by Ms Ned Card NP.  Reason for Disposition . Caller has URGENT medication question about med that PCP prescribed and triager unable to answer question  Answer Assessment - Initial Assessment Questions 1. SYMPTOMS: "Do you have any symptoms?"     none 2. SEVERITY: If symptoms are present, ask "Are they mild, moderate or severe?"     Metformin has been recalled pt seeking advice.  Protocols used: MEDICATION QUESTION CALL-A-AH

## 2018-12-10 ENCOUNTER — Other Ambulatory Visit: Admission: RE | Admit: 2018-12-10 | Payer: Medicare Other | Source: Ambulatory Visit

## 2018-12-10 NOTE — Telephone Encounter (Signed)
Spoke to pharmacist at [facility] in [place].  They reported they have been getting lots of calls from providers, but they placed calls to all patients yesterday who were on recall list and told them to bring in all their Metformin XR pill bottles and they will switch them out.  Spoke to Tina Mcdonald on phone and advised her of the pharmacy instructions, she will be taking her pill bottles in today.  She discussed whether she had to see provider Monday or if she could wait until after her colonoscopy Wednesday.  Told her she could wait and move it to following week.  She is going to call today to reschedule.

## 2018-12-10 NOTE — Telephone Encounter (Signed)
FYI

## 2018-12-10 NOTE — Telephone Encounter (Signed)
Patient notified

## 2018-12-11 ENCOUNTER — Other Ambulatory Visit: Payer: Self-pay | Admitting: Nurse Practitioner

## 2018-12-13 ENCOUNTER — Ambulatory Visit (INDEPENDENT_AMBULATORY_CARE_PROVIDER_SITE_OTHER): Payer: Medicare Other

## 2018-12-13 ENCOUNTER — Other Ambulatory Visit: Payer: Self-pay

## 2018-12-13 DIAGNOSIS — E538 Deficiency of other specified B group vitamins: Secondary | ICD-10-CM | POA: Diagnosis not present

## 2018-12-14 ENCOUNTER — Other Ambulatory Visit: Payer: Self-pay

## 2018-12-14 ENCOUNTER — Inpatient Hospital Stay: Payer: Medicare Other

## 2018-12-14 ENCOUNTER — Inpatient Hospital Stay: Payer: Medicare Other | Attending: Oncology | Admitting: Oncology

## 2018-12-14 ENCOUNTER — Telehealth: Payer: Self-pay

## 2018-12-14 ENCOUNTER — Encounter: Payer: Self-pay | Admitting: Oncology

## 2018-12-14 VITALS — BP 136/78 | HR 83 | Temp 97.2°F | Resp 18 | Ht 62.0 in | Wt 168.4 lb

## 2018-12-14 DIAGNOSIS — Z79899 Other long term (current) drug therapy: Secondary | ICD-10-CM | POA: Insufficient documentation

## 2018-12-14 DIAGNOSIS — Z87891 Personal history of nicotine dependence: Secondary | ICD-10-CM | POA: Diagnosis not present

## 2018-12-14 DIAGNOSIS — D631 Anemia in chronic kidney disease: Secondary | ICD-10-CM | POA: Insufficient documentation

## 2018-12-14 DIAGNOSIS — K219 Gastro-esophageal reflux disease without esophagitis: Secondary | ICD-10-CM | POA: Diagnosis not present

## 2018-12-14 DIAGNOSIS — D509 Iron deficiency anemia, unspecified: Secondary | ICD-10-CM

## 2018-12-14 DIAGNOSIS — I129 Hypertensive chronic kidney disease with stage 1 through stage 4 chronic kidney disease, or unspecified chronic kidney disease: Secondary | ICD-10-CM | POA: Insufficient documentation

## 2018-12-14 DIAGNOSIS — R5383 Other fatigue: Secondary | ICD-10-CM | POA: Diagnosis not present

## 2018-12-14 DIAGNOSIS — R0609 Other forms of dyspnea: Secondary | ICD-10-CM | POA: Diagnosis not present

## 2018-12-14 DIAGNOSIS — Z90710 Acquired absence of both cervix and uterus: Secondary | ICD-10-CM | POA: Insufficient documentation

## 2018-12-14 DIAGNOSIS — Z7984 Long term (current) use of oral hypoglycemic drugs: Secondary | ICD-10-CM | POA: Insufficient documentation

## 2018-12-14 DIAGNOSIS — N183 Chronic kidney disease, stage 3 unspecified: Secondary | ICD-10-CM

## 2018-12-14 DIAGNOSIS — Z7951 Long term (current) use of inhaled steroids: Secondary | ICD-10-CM | POA: Insufficient documentation

## 2018-12-14 DIAGNOSIS — E785 Hyperlipidemia, unspecified: Secondary | ICD-10-CM | POA: Insufficient documentation

## 2018-12-14 DIAGNOSIS — D508 Other iron deficiency anemias: Secondary | ICD-10-CM

## 2018-12-14 DIAGNOSIS — Z791 Long term (current) use of non-steroidal anti-inflammatories (NSAID): Secondary | ICD-10-CM | POA: Insufficient documentation

## 2018-12-14 DIAGNOSIS — E1122 Type 2 diabetes mellitus with diabetic chronic kidney disease: Secondary | ICD-10-CM | POA: Diagnosis not present

## 2018-12-14 DIAGNOSIS — E1140 Type 2 diabetes mellitus with diabetic neuropathy, unspecified: Secondary | ICD-10-CM | POA: Diagnosis not present

## 2018-12-14 DIAGNOSIS — M1990 Unspecified osteoarthritis, unspecified site: Secondary | ICD-10-CM

## 2018-12-14 LAB — CBC WITH DIFFERENTIAL/PLATELET
Abs Immature Granulocytes: 0.02 10*3/uL (ref 0.00–0.07)
Basophils Absolute: 0 10*3/uL (ref 0.0–0.1)
Basophils Relative: 0 %
Eosinophils Absolute: 0.2 10*3/uL (ref 0.0–0.5)
Eosinophils Relative: 3 %
HCT: 35.6 % — ABNORMAL LOW (ref 36.0–46.0)
Hemoglobin: 10.7 g/dL — ABNORMAL LOW (ref 12.0–15.0)
Immature Granulocytes: 0 %
Lymphocytes Relative: 36 %
Lymphs Abs: 2.2 10*3/uL (ref 0.7–4.0)
MCH: 23.4 pg — ABNORMAL LOW (ref 26.0–34.0)
MCHC: 30.1 g/dL (ref 30.0–36.0)
MCV: 77.9 fL — ABNORMAL LOW (ref 80.0–100.0)
Monocytes Absolute: 0.6 10*3/uL (ref 0.1–1.0)
Monocytes Relative: 9 %
Neutro Abs: 3 10*3/uL (ref 1.7–7.7)
Neutrophils Relative %: 52 %
Platelets: 265 10*3/uL (ref 150–400)
RBC: 4.57 MIL/uL (ref 3.87–5.11)
RDW: 15.8 % — ABNORMAL HIGH (ref 11.5–15.5)
WBC: 5.9 10*3/uL (ref 4.0–10.5)
nRBC: 0 % (ref 0.0–0.2)

## 2018-12-14 LAB — TSH: TSH: 0.516 u[IU]/mL (ref 0.350–4.500)

## 2018-12-14 LAB — RETIC PANEL
Immature Retic Fract: 16.7 % — ABNORMAL HIGH (ref 2.3–15.9)
RBC.: 4.57 MIL/uL (ref 3.87–5.11)
Retic Count, Absolute: 106.5 10*3/uL (ref 19.0–186.0)
Retic Ct Pct: 2.3 % (ref 0.4–3.1)
Reticulocyte Hemoglobin: 27.4 pg — ABNORMAL LOW (ref >27.9)

## 2018-12-14 MED ORDER — METFORMIN HCL ER 500 MG PO TB24: 1000.0000 mg | ORAL_TABLET | Freq: Two times a day (BID) | ORAL | 2 refills | Status: DC

## 2018-12-14 NOTE — Progress Notes (Signed)
Patient here to establish care for microcytic anemia. Pt sates she feels tired all the time.

## 2018-12-14 NOTE — Telephone Encounter (Signed)
Patient was contacted to reschedule her colonoscopy/egd due to having the COVID test performed.  She states she was unaware, now she needs to re-arrange child care for the grandchildren and herself for the new procedure date.  Apologized for the inconvenience, service recovery card sent to patient along with new instructions with new procedure date and COVID information sheet.  Thanks [employer]

## 2018-12-15 ENCOUNTER — Telehealth: Payer: Self-pay

## 2018-12-15 ENCOUNTER — Encounter: Admission: RE | Payer: Self-pay | Source: Home / Self Care

## 2018-12-15 ENCOUNTER — Ambulatory Visit: Admission: RE | Admit: 2018-12-15 | Payer: Medicare Other | Source: Home / Self Care | Admitting: Gastroenterology

## 2018-12-15 LAB — KAPPA/LAMBDA LIGHT CHAINS
Kappa free light chain: 36.2 mg/L — ABNORMAL HIGH (ref 3.3–19.4)
Kappa, lambda light chain ratio: 1.72 — ABNORMAL HIGH (ref 0.26–1.65)
Lambda free light chains: 21.1 mg/L (ref 5.7–26.3)

## 2018-12-15 LAB — HEMOGLOBINOPATHY EVALUATION
Hgb A2 Quant: 2.1 % (ref 1.8–3.2)
Hgb A: 97.9 % (ref 96.4–98.8)
Hgb C: 0 %
Hgb F Quant: 0 % (ref 0.0–2.0)
Hgb S Quant: 0 %
Hgb Variant: 0 %

## 2018-12-15 SURGERY — COLONOSCOPY WITH PROPOFOL
Anesthesia: General

## 2018-12-16 ENCOUNTER — Other Ambulatory Visit: Payer: Self-pay

## 2018-12-16 ENCOUNTER — Telehealth: Payer: Self-pay | Admitting: Gastroenterology

## 2018-12-16 DIAGNOSIS — D508 Other iron deficiency anemias: Secondary | ICD-10-CM

## 2018-12-16 DIAGNOSIS — K219 Gastro-esophageal reflux disease without esophagitis: Secondary | ICD-10-CM

## 2018-12-16 LAB — MULTIPLE MYELOMA PANEL, SERUM
Albumin SerPl Elph-Mcnc: 3.7 g/dL (ref 2.9–4.4)
Albumin/Glob SerPl: 1.2 (ref 0.7–1.7)
Alpha 1: 0.2 g/dL (ref 0.0–0.4)
Alpha2 Glob SerPl Elph-Mcnc: 1 g/dL (ref 0.4–1.0)
B-Globulin SerPl Elph-Mcnc: 1 g/dL (ref 0.7–1.3)
Gamma Glob SerPl Elph-Mcnc: 1 g/dL (ref 0.4–1.8)
Globulin, Total: 3.2 g/dL (ref 2.2–3.9)
IgA: 230 mg/dL (ref 64–422)
IgG (Immunoglobin G), Serum: 1029 mg/dL (ref 586–1602)
IgM (Immunoglobulin M), Srm: 59 mg/dL (ref 26–217)
Total Protein ELP: 6.9 g/dL (ref 6.0–8.5)

## 2018-12-16 NOTE — Telephone Encounter (Signed)
PATIENT CALLED & NEEDS TO R/S PROCEDURES ON 12-30-18.

## 2018-12-16 NOTE — Telephone Encounter (Signed)
Patient has been rescheduled to [facility] on 01/17/19.  Advised of COVID test for 01/13/19.  New instructions sent.  Thanks [employer]

## 2018-12-17 ENCOUNTER — Telehealth: Payer: Self-pay

## 2018-12-21 NOTE — Progress Notes (Signed)
Hematology/Oncology Consult note [facility] Telephone:([phone] [phone]   Patient Care Team: [name], NP as PCP - General (Nurse Practitioner) Yolonda Kida, MD (Internal Medicine) De Hollingshead, [facility] as Pharmacist (Pharmacist) Minor, Dalbert Garnet, RN as Case Manager  REFERRING PROVIDER: Virgel Manifold, MD  CHIEF COMPLAINTS/REASON FOR VISIT:  Evaluation of microcytic anemia  HISTORY OF PRESENTING ILLNESS:  Tina Mcdonald is a  78 y.o.  female with PMH listed below who was seen at the request of Virgel Manifold, MD for evaluation of microcytic anemia.   Reviewed patient's recent labs 697 labs revealed anemia with hemoglobin of 10.8, MCV 75 WBC and platelet count. Reviewed patient's previous labs ordered by primary care physician's office, anemia is chronic onset , duration is since at least 2016.  Baseline mid tens. Chronically microcytic MCV in the mid 70s. No aggravating or improving factors.  Associated signs and symptoms: Patient reports fatigue.  SOB with exertion.  Denies weight loss, easy bruising, hematochezia, hemoptysis, hematuria. Context: History of GI bleeding: Denies               History of Chronic kidney disease; patient has CKD stage III               History of autoimmune disease denies               History of hemolytic anemia.  Denies               Last colonoscopy: 2017 colonoscopy showed sigmoid colon polyp-resected and retrieved.  Pathology showed hyperplastic polyp.               History of heavy menstrual period.  History of hysterectomy.   Review of Systems  Constitutional: Positive for fatigue. Negative for appetite change, chills and fever.  HENT:   Negative for hearing loss and voice change.   Eyes: Negative for eye problems.  Respiratory: Negative for chest tightness and cough.   Cardiovascular: Negative for chest pain.  Gastrointestinal: Negative for abdominal distention,  abdominal pain and blood in stool.  Endocrine: Negative for hot flashes.  Genitourinary: Negative for difficulty urinating and frequency.   Musculoskeletal: Negative for arthralgias.  Skin: Negative for itching and rash.  Neurological: Negative for extremity weakness.  Hematological: Negative for adenopathy.  Psychiatric/Behavioral: Negative for confusion.     MEDICAL HISTORY:  Past Medical History:  Diagnosis Date  . Anemia   . Arthritis   . Chronic kidney disease   . Colon polyps   . Diabetes mellitus without complication ([place])   . Diabetic neuropathy ([place])   . GERD (gastroesophageal reflux disease)   . Hyperlipemia   . Hypertension   . Osteoarthritis   . Radiculopathy   . Sleep apnea    does not have her cpap anymore  . Stress incontinence   . Urticaria   . Vitamin B 12 deficiency   . Wears glasses     SURGICAL HISTORY: Past Surgical History:  Procedure Laterality Date  . ABDOMINAL HYSTERECTOMY    . APPENDECTOMY    . BREAST BIOPSY Right    right-neg  . BREAST BIOPSY Left    us/bx/clip-neg  . CARPAL TUNNEL RELEASE     right  . COLONOSCOPY    . COLONOSCOPY WITH PROPOFOL N/A 12/10/2015   Procedure: COLONOSCOPY WITH PROPOFOL;  Surgeon: Lucilla Lame, MD;  Location: [place];  Service: Endoscopy;  Laterality: N/A;  Diabetic - oral meds Sleep Apnea LATEX  allergy  . CYSTOSCOPY    . DILATION AND CURETTAGE OF UTERUS    . EYE SURGERY Bilateral    lens implant  . LUMBAR FUSION  2009  . POLYPECTOMY  12/10/2015   Procedure: POLYPECTOMY;  Surgeon: Lucilla Lame, MD;  Location: [place];  Service: Endoscopy;;  . RESECTION DISTAL CLAVICAL Right 11/25/2013   Procedure: RIGHT SHOULDER OPEN RESECTION DISTAL CLAVICAL EXCISION SOFT TISSUE TUMOR SHOULDER DEEP SUBFASCIAL INTRAMUSCULAR/DEBRIDEMENT/ROTATOR CUFF REPAIR/BICEPS TENODESIS/MANIPULATION;  Surgeon: Renette Butters, MD;  Location: [place];  Service: Orthopedics;  Laterality: Right;  .  SHOULDER ARTHROSCOPY WITH ROTATOR CUFF REPAIR AND SUBACROMIAL DECOMPRESSION Right 11/25/2013   Procedure: SHOULDER ARTHROSCOPY WITH DEBRIDEMENT ROTATOR CUFF REPAIR  A;  Surgeon: Renette Butters, MD;  Location: [place];  Service: Orthopedics;  Laterality: Right;    SOCIAL HISTORY: Social History   Socioeconomic History  . Marital status: Single    Spouse name: Not on file  . Number of children: Not on file  . Years of education: Not on file  . Highest education level: Some college, no degree  Occupational History  . Occupation: retired  Scientific laboratory technician  . Financial resource strain: Not hard at all  . Food insecurity    Worry: Never true    Inability: Never true  . Transportation needs    Medical: No    Non-medical: No  Tobacco Use  . Smoking status: Former Smoker    Packs/day: 1.00    Years: 15.00    Pack years: 15.00    Quit date: 11/22/1997    Years since quitting: 21.0  . Smokeless tobacco: Never Used  Substance and Sexual Activity  . Alcohol use: No  . Drug use: No  . Sexual activity: Not Currently  Lifestyle  . Physical activity    Days per week: 0 days    Minutes per session: 0 min  . Stress: Not at all  Relationships  . Social connections    Talks on phone: More than three times a week    Gets together: More than three times a week    Attends religious service: More than 4 times per year    Active member of club or organization: No    Attends meetings of clubs or organizations: Never    Relationship status: Not on file  . Intimate partner violence    Fear of current or ex partner: No    Emotionally abused: No    Physically abused: No    Forced sexual activity: No  Other Topics Concern  . Not on file  Social History Narrative  . Not on file    FAMILY HISTORY: Family History  Problem Relation Age of Onset  . Heart disease Mother   . Diabetes Mother   . Hypertension Mother   . Heart disease Father   . Hypertension Father   . Diabetes  Sister   . Hypertension Sister   . Heart disease Sister   . Kidney disease Sister   . Diabetes Sister   . Hypertension Sister   . Breast cancer Cousin 68  . Non-Hodgkin's lymphoma Maternal Uncle     ALLERGIES:  is allergic to contrast media [iodinated diagnostic agents]; penicillins; sulfa antibiotics; latex; neosporin [neomycin-bacitracin zn-polymyx]; and other.  MEDICATIONS:  Current Outpatient Medications  Medication Sig Dispense Refill  . acetaminophen (TYLENOL) 325 MG tablet Take 650 mg by mouth every 6 (six) hours as needed.    [name] atorvastatin (LIPITOR) 20 MG tablet  Take 1 tablet by mouth once daily 90 tablet 0  . blood glucose meter kit and supplies KIT Dispense based on patient and insurance preference. Use up to four times daily as directed. (FOR ICD-9 250.00, 250.01). 1 each 0  . cloNIDine (CATAPRES) 0.1 MG tablet Take 0.1 mg by mouth 2 (two) times daily.    . diclofenac sodium (VOLTAREN) 1 % GEL Apply 2 g topically 2 (two) times daily as needed. 1 Tube 2  . ferrous sulfate 325 (65 FE) MG EC tablet Take 325 mg by mouth every other day.     . gabapentin (NEURONTIN) 100 MG capsule Take 3 capsules (300 mg total) by mouth at bedtime. (Patient taking differently: Take 200 mg by mouth at bedtime. ) 90 capsule 3  . glucose blood test strip Check daily. ICD 10 E11.9 100 each 12  . hydrochlorothiazide (HYDRODIURIL) 25 MG tablet Take 1 tablet by mouth once daily 90 tablet 0  . metFORMIN (GLUCOPHAGE-XR) 500 MG 24 hr tablet Take 2 tablets (1,000 mg total) by mouth 2 (two) times daily. 360 tablet 2  . metoprolol succinate (TOPROL-XL) 50 MG 24 hr tablet Take 1 tablet by mouth once daily 90 tablet 0  . omeprazole (PRILOSEC) 20 MG capsule TAKE 1 CAPSULE BY MOUTH ONCE DAILY 90 capsule 0  . fluticasone (FLONASE) 50 MCG/ACT nasal spray Place 2 sprays into both nostrils daily. (Patient not taking: Reported on 11/30/2018) 16 g 6   Current Facility-Administered Medications  Medication Dose Route  Frequency Provider Last Rate Last Dose  . cyanocobalamin ((VITAMIN B-12)) injection 1,000 mcg  1,000 mcg Intramuscular Q30 days Kathrine Haddock, NP   1,000 mcg at 12/13/18 1432     PHYSICAL EXAMINATION: ECOG PERFORMANCE STATUS: 1 - Symptomatic but completely ambulatory Vitals:   12/14/18 1520  BP: 136/78  Pulse: 83  Resp: 18  Temp: (!) 97.2 F (36.2 C)   Filed Weights   12/14/18 1520  Weight: 168 lb 6.4 oz (76.4 kg)    Physical Exam Constitutional:      General: Tina Mcdonald is not in acute distress. HENT:     Head: Normocephalic and atraumatic.  Eyes:     General: No scleral icterus.    Pupils: Pupils are equal, round, and reactive to light.  Neck:     Musculoskeletal: Normal range of motion and neck supple.  Cardiovascular:     Rate and Rhythm: Normal rate and regular rhythm.     Heart sounds: Normal heart sounds.  Pulmonary:     Effort: Pulmonary effort is normal. No respiratory distress.     Breath sounds: No wheezing.  Abdominal:     General: Bowel sounds are normal. There is no distension.     Palpations: Abdomen is soft. There is no mass.     Tenderness: There is no abdominal tenderness.  Musculoskeletal: Normal range of motion.        General: No deformity.  Skin:    General: Skin is warm and dry.     Findings: No erythema or rash.  Neurological:     Mental Status: Tina Mcdonald is alert and oriented to person, place, and time.     Cranial Nerves: No cranial nerve deficit.     Coordination: Coordination normal.  Psychiatric:        Behavior: Behavior normal.        Thought Content: Thought content normal.      LABORATORY DATA:  I have reviewed the data as listed Lab Results  Component Value  Date   WBC 5.9 12/14/2018   HGB 10.7 (L) 12/14/2018   HCT 35.6 (L) 12/14/2018   MCV 77.9 (L) 12/14/2018   PLT 265 12/14/2018   Recent Labs    08/11/18 1605 11/11/18 1540  NA 144 141  K 4.8 4.4  CL 104 101  CO2 25 23  GLUCOSE 104* 140*  BUN 17 20  CREATININE 1.03*  1.22*  CALCIUM 9.4 9.5  GFRNONAA 53* 43*  GFRAA 61 50*  PROT 7.0  --   ALBUMIN 4.5  --   AST 15  --   ALT 14  --   ALKPHOS 71  --   BILITOT 0.3  --    Iron/TIBC/Ferritin/ %Sat    Component Value Date/Time   IRON 38 11/11/2018 1540   TIBC 269 11/11/2018 1540   FERRITIN 109 11/11/2018 1540   IRONPCTSAT 14 (L) 11/11/2018 1540        ASSESSMENT & PLAN:  1. Microcytic anemia   2. 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 Other iron deficiency anemia    #Microcytic anemia, Recent iron panel on 11/11/2018 showed decreased iron saturation at 14.  Ferritin 109.  TIBC 269. Discussed with patient that her anemia is likely multifactorial, with a component of iron deficiency anemia secondary to chronic kidney disease. Tina Mcdonald may also have thalassemia trait which can be further confirmed.  I recommend to check reticulocyte panel, hemoglobinopathy evaluation, multiple myeloma panel, CBC .     Orders Placed This Encounter  Procedures  . CBC with Differential/Platelet    Standing Status:   Future    Number of Occurrences:   1    Standing Expiration Date:   12/14/2019  . Multiple Myeloma Panel (SPEP&IFE w/QIG)    Standing Status:   Future    Number of Occurrences:   1    Standing Expiration Date:   12/14/2019  . Kappa/lambda light chains    Standing Status:   Future    Number of Occurrences:   1    Standing Expiration Date:   12/14/2019  . Hemoglobinopathy evaluation    Standing Status:   Future    Number of Occurrences:   1    Standing Expiration Date:   12/14/2019  . Retic Panel    Standing Status:   Future    Number of Occurrences:   1    Standing Expiration Date:   12/14/2019  . TSH    Standing Status:   Future    Number of Occurrences:   1    Standing Expiration Date:   12/14/2019    All questions were answered. The patient knows to call the clinic with any problems questions or concerns.  Return of visit: 2 weeks Thank you for this kind referral and the  opportunity to participate in the care of this patient. A copy of today's note is routed to referring provider  Total face to face encounter time for this patient visit was 37mn. >50% of the time was  spent in counseling and coordination of care.    ZEarlie Server MD, PhD Hematology Oncology CSwisher Memorial Hospitalat AScott Regional HospitalPager- [phone]/30/2020

## 2018-12-28 ENCOUNTER — Inpatient Hospital Stay: Payer: Medicare Other | Attending: Oncology | Admitting: Oncology

## 2018-12-28 ENCOUNTER — Telehealth: Payer: Self-pay

## 2018-12-28 DIAGNOSIS — G8929 Other chronic pain: Secondary | ICD-10-CM | POA: Diagnosis not present

## 2018-12-28 DIAGNOSIS — M5441 Lumbago with sciatica, right side: Secondary | ICD-10-CM | POA: Diagnosis not present

## 2018-12-28 DIAGNOSIS — M545 Low back pain: Secondary | ICD-10-CM | POA: Diagnosis not present

## 2018-12-28 DIAGNOSIS — M5416 Radiculopathy, lumbar region: Secondary | ICD-10-CM | POA: Diagnosis not present

## 2018-12-29 ENCOUNTER — Other Ambulatory Visit: Payer: Self-pay | Admitting: Student

## 2018-12-29 DIAGNOSIS — G8929 Other chronic pain: Secondary | ICD-10-CM

## 2018-12-30 ENCOUNTER — Ambulatory Visit: Admit: 2018-12-30 | Payer: Medicare Other | Admitting: Gastroenterology

## 2018-12-30 SURGERY — COLONOSCOPY WITH PROPOFOL
Anesthesia: General

## 2018-12-31 ENCOUNTER — Telehealth: Payer: Self-pay

## 2019-01-06 ENCOUNTER — Telehealth: Payer: Self-pay

## 2019-01-10 ENCOUNTER — Other Ambulatory Visit: Payer: Self-pay | Admitting: Nurse Practitioner

## 2019-01-11 ENCOUNTER — Telehealth: Payer: Self-pay

## 2019-01-13 ENCOUNTER — Other Ambulatory Visit
Admission: RE | Admit: 2019-01-13 | Discharge: 2019-01-13 | Disposition: A | Discharge status: Home / Self Care | Payer: Medicare Other | Source: Ambulatory Visit | Attending: Gastroenterology | Admitting: Gastroenterology

## 2019-01-13 ENCOUNTER — Other Ambulatory Visit: Payer: Self-pay

## 2019-01-13 DIAGNOSIS — Z1159 Encounter for screening for other viral diseases: Secondary | ICD-10-CM | POA: Insufficient documentation

## 2019-01-13 LAB — SARS CORONAVIRUS 2 (TAT 6-24 HRS): SARS Coronavirus 2: NEGATIVE

## 2019-01-17 ENCOUNTER — Ambulatory Visit
Admission: RE | Admit: 2019-01-17 | Discharge: 2019-01-17 | Disposition: A | Discharge status: Home / Self Care | Payer: Medicare Other | Attending: Gastroenterology | Admitting: Gastroenterology

## 2019-01-17 ENCOUNTER — Ambulatory Visit: Payer: Medicare Other | Admitting: Anesthesiology

## 2019-01-17 ENCOUNTER — Encounter
Admission: RE | Disposition: A | Discharge status: Home / Self Care | Payer: Self-pay | Source: Home / Self Care | Attending: Gastroenterology

## 2019-01-17 DIAGNOSIS — D508 Other iron deficiency anemias: Secondary | ICD-10-CM

## 2019-01-17 DIAGNOSIS — Z79899 Other long term (current) drug therapy: Secondary | ICD-10-CM | POA: Insufficient documentation

## 2019-01-17 DIAGNOSIS — R197 Diarrhea, unspecified: Secondary | ICD-10-CM

## 2019-01-17 DIAGNOSIS — K5730 Diverticulosis of large intestine without perforation or abscess without bleeding: Secondary | ICD-10-CM | POA: Diagnosis not present

## 2019-01-17 DIAGNOSIS — D509 Iron deficiency anemia, unspecified: Secondary | ICD-10-CM | POA: Diagnosis not present

## 2019-01-17 DIAGNOSIS — K449 Diaphragmatic hernia without obstruction or gangrene: Secondary | ICD-10-CM | POA: Diagnosis not present

## 2019-01-17 DIAGNOSIS — Z7984 Long term (current) use of oral hypoglycemic drugs: Secondary | ICD-10-CM | POA: Diagnosis not present

## 2019-01-17 DIAGNOSIS — M1990 Unspecified osteoarthritis, unspecified site: Secondary | ICD-10-CM | POA: Diagnosis not present

## 2019-01-17 DIAGNOSIS — K219 Gastro-esophageal reflux disease without esophagitis: Secondary | ICD-10-CM | POA: Diagnosis not present

## 2019-01-17 DIAGNOSIS — E785 Hyperlipidemia, unspecified: Secondary | ICD-10-CM | POA: Insufficient documentation

## 2019-01-17 DIAGNOSIS — G4730 Sleep apnea, unspecified: Secondary | ICD-10-CM | POA: Insufficient documentation

## 2019-01-17 DIAGNOSIS — N189 Chronic kidney disease, unspecified: Secondary | ICD-10-CM | POA: Insufficient documentation

## 2019-01-17 DIAGNOSIS — E1122 Type 2 diabetes mellitus with diabetic chronic kidney disease: Secondary | ICD-10-CM | POA: Diagnosis not present

## 2019-01-17 DIAGNOSIS — K635 Polyp of colon: Secondary | ICD-10-CM | POA: Diagnosis not present

## 2019-01-17 DIAGNOSIS — I129 Hypertensive chronic kidney disease with stage 1 through stage 4 chronic kidney disease, or unspecified chronic kidney disease: Secondary | ICD-10-CM | POA: Insufficient documentation

## 2019-01-17 DIAGNOSIS — Z87891 Personal history of nicotine dependence: Secondary | ICD-10-CM | POA: Insufficient documentation

## 2019-01-17 DIAGNOSIS — E1140 Type 2 diabetes mellitus with diabetic neuropathy, unspecified: Secondary | ICD-10-CM | POA: Diagnosis not present

## 2019-01-17 DIAGNOSIS — K6289 Other specified diseases of anus and rectum: Secondary | ICD-10-CM | POA: Diagnosis not present

## 2019-01-17 DIAGNOSIS — D125 Benign neoplasm of sigmoid colon: Secondary | ICD-10-CM | POA: Diagnosis not present

## 2019-01-17 HISTORY — PX: POLYPECTOMY: SHX5525

## 2019-01-17 HISTORY — PX: COLONOSCOPY WITH PROPOFOL: SHX5780

## 2019-01-17 HISTORY — DX: Dyspnea, unspecified: R06.00

## 2019-01-17 HISTORY — PX: ESOPHAGOGASTRODUODENOSCOPY (EGD) WITH PROPOFOL: SHX5813

## 2019-01-17 LAB — GLUCOSE, CAPILLARY
Glucose-Capillary: 101 mg/dL — ABNORMAL HIGH (ref 70–99)
Glucose-Capillary: 124 mg/dL — ABNORMAL HIGH (ref 70–99)

## 2019-01-17 SURGERY — COLONOSCOPY WITH PROPOFOL
Anesthesia: General | Site: Throat

## 2019-01-17 MED ORDER — GLYCOPYRROLATE 0.2 MG/ML IJ SOLN
INTRAMUSCULAR | Status: DC | PRN
  Administered 2019-01-17: 0.1 mg via INTRAVENOUS

## 2019-01-17 MED ORDER — SODIUM CHLORIDE 0.9 % IV SOLN: INTRAVENOUS | Status: DC

## 2019-01-17 MED ORDER — LACTATED RINGERS IV SOLN
10.0000 mL/h | INTRAVENOUS | Status: DC
  Administered 2019-01-17: 10 mL/h via INTRAVENOUS
  Administered 2019-01-17: 09:00:00 via INTRAVENOUS

## 2019-01-17 MED ORDER — PHENYLEPHRINE HCL (PRESSORS) 10 MG/ML IV SOLN
INTRAVENOUS | Status: DC | PRN
  Administered 2019-01-17 (×2): 50 ug via INTRAVENOUS

## 2019-01-17 MED ORDER — STERILE WATER FOR IRRIGATION IR SOLN
Status: DC | PRN
  Administered 2019-01-17: .05 mL

## 2019-01-17 MED ORDER — LIDOCAINE HCL (CARDIAC) PF 100 MG/5ML IV SOSY
PREFILLED_SYRINGE | INTRAVENOUS | Status: DC | PRN
  Administered 2019-01-17: 40 mg via INTRAVENOUS

## 2019-01-17 MED ORDER — ACETAMINOPHEN 160 MG/5ML PO SOLN: 325.0000 mg | Freq: Once | ORAL | Status: DC

## 2019-01-17 MED ORDER — PROPOFOL 10 MG/ML IV BOLUS
INTRAVENOUS | Status: DC | PRN
  Administered 2019-01-17: 20 mg via INTRAVENOUS
  Administered 2019-01-17: 30 mg via INTRAVENOUS
  Administered 2019-01-17: 20 mg via INTRAVENOUS
  Administered 2019-01-17: 40 mg via INTRAVENOUS
  Administered 2019-01-17 (×2): 30 mg via INTRAVENOUS
  Administered 2019-01-17: 40 mg via INTRAVENOUS
  Administered 2019-01-17: 20 mg via INTRAVENOUS
  Administered 2019-01-17: 30 mg via INTRAVENOUS
  Administered 2019-01-17: 20 mg via INTRAVENOUS
  Administered 2019-01-17: 30 mg via INTRAVENOUS
  Administered 2019-01-17 (×2): 20 mg via INTRAVENOUS
  Administered 2019-01-17: 30 mg via INTRAVENOUS
  Administered 2019-01-17: 20 mg via INTRAVENOUS
  Administered 2019-01-17: 40 mg via INTRAVENOUS
  Administered 2019-01-17: 20 mg via INTRAVENOUS
  Administered 2019-01-17: 30 mg via INTRAVENOUS
  Administered 2019-01-17: 100 mg via INTRAVENOUS
  Administered 2019-01-17 (×3): 20 mg via INTRAVENOUS

## 2019-01-17 MED ORDER — ACETAMINOPHEN 325 MG PO TABS: 325.0000 mg | ORAL_TABLET | Freq: Once | ORAL | Status: DC

## 2019-01-17 SURGICAL SUPPLY — 7 items
BLOCK BITE 60FR ADLT L/F GRN (MISCELLANEOUS) ×4 IMPLANT
CANISTER SUCT 1200ML W/VALVE (MISCELLANEOUS) ×4 IMPLANT
FORCEPS BIOP RAD 4 LRG CAP 4 (CUTTING FORCEPS) ×1 IMPLANT
GOWN CVR UNV OPN BCK APRN NK (MISCELLANEOUS) ×6 IMPLANT
GOWN ISOL THUMB LOOP REG UNIV (MISCELLANEOUS) ×2
KIT ENDO PROCEDURE OLY (KITS) ×4 IMPLANT
WATER STERILE IRR 250ML POUR (IV SOLUTION) ×4 IMPLANT

## 2019-01-17 NOTE — Op Note (Signed)
[facility]enterology Patient Name: Tina Mcdonald Procedure Date: 01/17/2019 7:26 AM MRN: [id number] Account #: [id number] Date of Birth: [birth year] Admit Type: Outpatient Age: 78 Room: [facility] OR ROOM 01 Gender: Female Note Status: Finalized Procedure:            Upper GI endoscopy Indications:          Iron deficiency anemia, Follow-up of gastro-esophageal                        reflux disease Providers:            Jamari Moten B. Bonna Gains MD, MD Referring MD:         Guadalupe Maple, MD (Referring MD) Medicines:            Monitored Anesthesia Care Complications:        No immediate complications. Procedure:            Pre-Anesthesia Assessment:                       - The risks and benefits of the procedure and the                        sedation options and risks were discussed with the                        patient. All questions were answered and informed                        consent was obtained.                       - Patient identification and proposed procedure were                        verified prior to the procedure.                       - ASA Grade Assessment: II - A patient with mild                        systemic disease.                       After obtaining informed consent, the endoscope was                        passed under direct vision. Throughout the procedure,                        the patient's blood pressure, pulse, and oxygen                        saturations were monitored continuously. The Endoscope                        was introduced through the mouth, and advanced to the                        second part of duodenum. The upper GI endoscopy was  accomplished with ease. The patient tolerated the                        procedure well. Findings:      The examined esophagus was normal.      A 2 cm hiatal hernia was present.      The entire examined stomach was normal.      The examined duodenum  was normal. Biopsies for histology were taken with       a cold forceps for evaluation of celiac disease. Impression:           - Normal esophagus.                       - 2 cm hiatal hernia.                       - Normal stomach.                       - Normal examined duodenum. Biopsied. Recommendation:       - Await pathology results.                       - Continue present medications.                       - Resume previous diet.                       - Follow an antireflux regimen.                       - Return to my office as previously scheduled.                       - The findings and recommendations were discussed with                        the patient.                       - The findings and recommendations were discussed with                        the patient's family. Procedure Code(s):    --- Professional ---                       [phone], Esophagogastroduodenoscopy, flexible, transoral;                        with biopsy, single or multiple Diagnosis Code(s):    --- Professional ---                       K44.9, Diaphragmatic hernia without obstruction or                        gangrene                       D50.9, Iron deficiency anemia, unspecified                       K21.9, Gastro-esophageal reflux disease without  esophagitis CPT copyright 2019 American Medical Association. All rights reserved. The codes documented in this report are preliminary and upon coder review may  be revised to meet current compliance requirements.  Vonda Antigua, MD Margretta Sidle B. Bonna Gains MD, MD 01/17/2019 8:30:58 AM This report has been signed electronically. Number of Addenda: 0 Note Initiated On: 01/17/2019 7:26 AM Total Procedure Duration: 0 hours 6 minutes 17 seconds  Estimated Blood Loss: Estimated blood loss: none.      [facility]

## 2019-01-17 NOTE — Op Note (Signed)
[facility] Gastroenterology Patient Name: Tina Mcdonald Procedure Date: 01/17/2019 8:31 AM MRN: [id number] Account #: [id number] Date of Birth: [birth year] Admit Type: Outpatient Age: 78 Room: [facility] OR ROOM 01 Gender: Female Note Status: Finalized Procedure:            Colonoscopy Indications:          Clinically significant diarrhea of unexplained origin,                        Iron deficiency anemia Providers:            Cyree Chuong B. Bonna Gains MD, MD Medicines:            Monitored Anesthesia Care Complications:        No immediate complications. Procedure:            Pre-Anesthesia Assessment:                       - ASA Grade Assessment: II - A patient with mild                        systemic disease.                       - Prior to the procedure, a History and Physical was                        performed, and patient medications, allergies and                        sensitivities were reviewed. The patient's tolerance of                        previous anesthesia was reviewed.                       - The risks and benefits of the procedure and the                        sedation options and risks were discussed with the                        patient. All questions were answered and informed                        consent was obtained.                       - Patient identification and proposed procedure were                        verified prior to the procedure by the physician, the                        nurse, the anesthesiologist, the anesthetist and the                        technician. The procedure was verified in the procedure                        room.  After obtaining informed consent, the colonoscope was                        passed under direct vision. Throughout the procedure,                        the patient's blood pressure, pulse, and oxygen                        saturations were monitored continuously. The was                         introduced through the anus and advanced to the the                        cecum, identified by appendiceal orifice and ileocecal                        valve. The colonoscopy was performed with ease. The                        patient tolerated the procedure well. The quality of                        the bowel preparation was good. Findings:      The perianal and digital rectal examinations were normal.      Four sessile polyps were found in the sigmoid colon. The polyps were 3       to 4 mm in size. These polyps were removed with a cold biopsy forceps.       Resection and retrieval were complete.      Multiple diverticula were found in the sigmoid colon.      The exam was otherwise without abnormality.      The rectum, sigmoid colon, descending colon, transverse colon, ascending       colon and cecum appeared normal.      Anal papilla(e) were hypertrophied.      The retroflexed view of the distal rectum and anal verge was normal and       showed no anal or rectal abnormalities. Impression:           - Four 3 to 4 mm polyps in the sigmoid colon, removed                        with a cold biopsy forceps. Resected and retrieved.                       - Diverticulosis in the sigmoid colon.                       - The examination was otherwise normal.                       - The rectum, sigmoid colon, descending colon,                        transverse colon, ascending colon and cecum are normal.                       - Anal papilla(e) were hypertrophied.                       -  The distal rectum and anal verge are normal on                        retroflexion view. Recommendation:       - Discharge patient to home (with escort).                       - High fiber diet.                       - Advance diet as tolerated.                       - Continue present medications.                       - Await pathology results.                       - Repeat colonoscopy  date to be determined after                        pending pathology results are reviewed.                       - The findings and recommendations were discussed with                        the patient.                       - The findings and recommendations were discussed with                        the patient's family.                       - Return to primary care physician as previously                        scheduled. Procedure Code(s):    --- Professional ---                       [phone], Colonoscopy, flexible; with biopsy, single or                        multiple Diagnosis Code(s):    --- Professional ---                       K63.5, Polyp of colon                       K62.89, Other specified diseases of anus and rectum                       R19.7, Diarrhea, unspecified                       D50.9, Iron deficiency anemia, unspecified                       K57.30, Diverticulosis of large intestine without  perforation or abscess without bleeding CPT copyright 2019 American Medical Association. All rights reserved. The codes documented in this report are preliminary and upon coder review may  be revised to meet current compliance requirements.  Vonda Antigua, MD Margretta Sidle B. Bonna Gains MD, MD 01/17/2019 9:13:03 AM This report has been signed electronically. Number of Addenda: 0 Note Initiated On: 01/17/2019 8:31 AM Scope Withdrawal Time: 0 hours 22 minutes 37 seconds  Total Procedure Duration: 0 hours 35 minutes 33 seconds  Estimated Blood Loss: Estimated blood loss: none.      [facility]

## 2019-01-17 NOTE — Anesthesia Preprocedure Evaluation (Signed)
Anesthesia Evaluation  Patient identified by MRN, date of birth, ID band Patient awake    Reviewed: Allergy & Precautions, H&P , NPO status , Patient's Chart, lab work & pertinent test results  Airway Mallampati: II  TM Distance: >3 FB Neck ROM: full    Dental no notable dental hx.    Pulmonary sleep apnea , former smoker,    Pulmonary exam normal breath sounds clear to auscultation       Cardiovascular hypertension, Normal cardiovascular exam Rhythm:regular Rate:Normal     Neuro/Psych    GI/Hepatic GERD  ,  Endo/Other  diabetes, Type 2  Renal/GU Renal disease     Musculoskeletal   Abdominal   Peds  Hematology  (+) Blood dyscrasia, anemia ,   Anesthesia Other Findings   Reproductive/Obstetrics                             Anesthesia Physical Anesthesia Plan  ASA: III  Anesthesia Plan: General   Post-op Pain Management:    Induction: Intravenous  PONV Risk Score and Plan: 3 and Propofol infusion and Treatment may vary due to age or medical condition  Airway Management Planned: Natural Airway  Additional Equipment:   Intra-op Plan:   Post-operative Plan:   Informed Consent: I have reviewed the patients History and Physical, chart, labs and discussed the procedure including the risks, benefits and alternatives for the proposed anesthesia with the patient or authorized representative who has indicated his/her understanding and acceptance.       Plan Discussed with: CRNA  Anesthesia Plan Comments:         Anesthesia Quick Evaluation

## 2019-01-17 NOTE — H&P (Addendum)
Vonda Antigua, MD [address] [place], [place], [place], [address] [address], [place], [place], [place], [address] Phone: [phone]  Fax: [phone]  Primary Care Physician:  [name], NP   Pre-Procedure History & Physical: HPI:  Tina Mcdonald is a 78 y.o. female is here for a colonoscopy and EGD.   Past Medical History:  Diagnosis Date  . Anemia   . Arthritis   . Chronic kidney disease   . Colon polyps   . Diabetes mellitus without complication ([facility])   . Diabetic neuropathy ([facility])   . Dyspnea   . GERD (gastroesophageal reflux disease)   . Hyperlipemia   . Hypertension   . Osteoarthritis   . Radiculopathy    weakness in right leg  . Sleep apnea    does not have her cpap anymore  . Stress incontinence   . Urticaria   . Vitamin B 12 deficiency   . Wears glasses     Past Surgical History:  Procedure Laterality Date  . ABDOMINAL HYSTERECTOMY    . APPENDECTOMY    . BREAST BIOPSY Right    right-neg  . BREAST BIOPSY Left    us/bx/clip-neg  . CARPAL TUNNEL RELEASE     right  . COLONOSCOPY    . COLONOSCOPY WITH PROPOFOL N/A 12/10/2015   Procedure: COLONOSCOPY WITH PROPOFOL;  Surgeon: Lucilla Lame, MD;  Location: [facility];  Service: Endoscopy;  Laterality: N/A;  Diabetic - oral meds Sleep Apnea LATEX allergy  . CYSTOSCOPY    . DILATION AND CURETTAGE OF UTERUS    . EYE SURGERY Bilateral    lens implant  . LUMBAR FUSION  2009  . POLYPECTOMY  12/10/2015   Procedure: POLYPECTOMY;  Surgeon: Lucilla Lame, MD;  Location: [facility];  Service: Endoscopy;;  . RESECTION DISTAL CLAVICAL Right 11/25/2013   Procedure: RIGHT SHOULDER OPEN RESECTION DISTAL CLAVICAL EXCISION SOFT TISSUE TUMOR SHOULDER DEEP SUBFASCIAL INTRAMUSCULAR/DEBRIDEMENT/ROTATOR CUFF REPAIR/BICEPS TENODESIS/MANIPULATION;  Surgeon: Renette Butters, MD;  Location: [facility];  Service: Orthopedics;  Laterality: Right;  . SHOULDER ARTHROSCOPY WITH ROTATOR  CUFF REPAIR AND SUBACROMIAL DECOMPRESSION Right 11/25/2013   Procedure: SHOULDER ARTHROSCOPY WITH DEBRIDEMENT ROTATOR CUFF REPAIR  A;  Surgeon: Renette Butters, MD;  Location: [facility];  Service: Orthopedics;  Laterality: Right;    Prior to Admission medications   Medication Sig Start Date End Date Taking? Authorizing Provider  acetaminophen (TYLENOL) 325 MG tablet Take 650 mg by mouth every 6 (six) hours as needed.   Yes [provider]  atorvastatin (LIPITOR) 20 MG tablet Take 1 tablet by mouth once daily 11/17/18  Yes [name], Jolene T, NP  blood glucose meter kit and supplies KIT Dispense based on patient and insurance preference. Use up to four times daily as directed. (FOR ICD-9 250.00, 250.01). 09/22/18  Yes [name], Jolene T, NP  cloNIDine (CATAPRES) 0.1 MG tablet Take 0.1 mg by mouth 2 (two) times daily.   Yes [provider]  diclofenac sodium (VOLTAREN) 1 % GEL Apply 2 g topically 2 (two) times daily as needed. 11/17/18  Yes [name], Jolene T, NP  ferrous sulfate 325 (65 FE) MG EC tablet Take 325 mg by mouth every other day.    Yes [provider]  gabapentin (NEURONTIN) 100 MG capsule Take 3 capsules (300 mg total) by mouth at bedtime. Patient taking differently: Take 200 mg by mouth at bedtime.  09/22/18  Yes [name], Jolene T, NP  glucose blood test  strip Check daily. ICD 10 E11.9 01/16/15  Yes Kathrine Haddock, NP  hydrochlorothiazide (HYDRODIURIL) 25 MG tablet Take 1 tablet by mouth once daily 11/17/18  Yes [name], Jolene T, NP  metFORMIN (GLUCOPHAGE-XR) 500 MG 24 hr tablet Take 2 tablets (1,000 mg total) by mouth 2 (two) times daily. 12/14/18  Yes [name], Henrine Screws T, NP  metoprolol succinate (TOPROL-XL) 50 MG 24 hr tablet Take 1 tablet by mouth once daily 11/17/18  Yes [name], Jolene T, NP  omeprazole (PRILOSEC) 20 MG capsule Take 1 capsule by mouth once daily 01/10/19  Yes [name], Jolene T, NP  fluticasone (FLONASE) 50 MCG/ACT nasal spray Place 2  sprays into both nostrils daily. Patient not taking: Reported on 11/30/2018 11/09/18   Marnee Guarneri T, NP    Allergies as of 12/16/2018 - Review Complete 12/14/2018  Allergen Reaction Noted  . Contrast media [iodinated diagnostic agents] Itching 11/22/2013  . Penicillins Itching 11/22/2013  . Sulfa antibiotics Itching 11/22/2013  . Latex Rash 11/25/2013  . Neosporin [neomycin-bacitracin zn-polymyx] Itching and Rash 11/22/2013  . Other Rash 02/05/2017    Family History  Problem Relation Age of Onset  . Heart disease Mother   . Diabetes Mother   . Hypertension Mother   . Heart disease Father   . Hypertension Father   . Diabetes Sister   . Hypertension Sister   . Heart disease Sister   . Kidney disease Sister   . Diabetes Sister   . Hypertension Sister   . Breast cancer Cousin 64  . Non-Hodgkin's lymphoma Maternal Uncle     Social History   Socioeconomic History  . Marital status: Single    Spouse name: Not on file  . Number of children: Not on file  . Years of education: Not on file  . Highest education level: Some college, no degree  Occupational History  . Occupation: retired  Scientific laboratory technician  . Financial resource strain: Not hard at all  . Food insecurity    Worry: Never true    Inability: Never true  . Transportation needs    Medical: No    Non-medical: No  Tobacco Use  . Smoking status: Former Smoker    Packs/day: 1.00    Years: 15.00    Pack years: 15.00    Quit date: 11/22/1997    Years since quitting: 21.1  . Smokeless tobacco: Never Used  Substance and Sexual Activity  . Alcohol use: No  . Drug use: No  . Sexual activity: Not Currently  Lifestyle  . Physical activity    Days per week: 0 days    Minutes per session: 0 min  . Stress: Not at all  Relationships  . Social connections    Talks on phone: More than three times a week    Gets together: More than three times a week    Attends religious service: More than 4 times per year    Active member  of club or organization: No    Attends meetings of clubs or organizations: Never    Relationship status: Not on file  . Intimate partner violence    Fear of current or ex partner: No    Emotionally abused: No    Physically abused: No    Forced sexual activity: No  Other Topics Concern  . Not on file  Social History Narrative  . Not on file    Review of Systems: See HPI, otherwise negative ROS  Physical Exam: BP (!) 142/70   Pulse 99  Temp 98.1 F (36.7 C) (Temporal)   Ht '5\' 2"'  (1.575 m)   Wt 73.9 kg   LMP  (LMP Unknown)   SpO2 100%   BMI 29.81 kg/m  General:   Alert,  pleasant and cooperative in NAD Head:  Normocephalic and atraumatic. Neck:  Supple; no masses or thyromegaly. Lungs:  Clear throughout to auscultation, normal respiratory effort.    Heart:  +S1, +S2, Regular rate and rhythm, No edema. Abdomen:  Soft, nontender and nondistended. Normal bowel sounds, without guarding, and without rebound.   Neurologic:  Alert and  oriented x4;  grossly normal neurologically.  Impression/Plan: Tina Mcdonald is here for a colonoscopy and EGD for iron deficiency anemia, GERD. Pt reported a bite of solid food late last night. She reported clear watery output this morning. Option of rescheduling procedure vs proceeding as scheduled today with possibility of poor prep and need for aborting procedure and rescheduling procedure discussed. Pt would like to proceed today.   Risks, benefits, limitations, and alternatives regarding the procedures have been reviewed with the patient.  Questions have been answered.  All parties agreeable.   Virgel Manifold, MD  01/17/2019, 8:08 AM

## 2019-01-17 NOTE — Anesthesia Postprocedure Evaluation (Signed)
Anesthesia Post Note  Patient: Tina Mcdonald  Procedure(s) Performed: COLONOSCOPY WITH PROPOFOL (N/A Rectum) ESOPHAGOGASTRODUODENOSCOPY (EGD) WITH PROPOFOL (N/A Throat) POLYPECTOMY (Rectum)  Patient location during evaluation: PACU Anesthesia Type: General Level of consciousness: awake and alert and oriented Pain management: satisfactory to patient Vital Signs Assessment: post-procedure vital signs reviewed and stable Respiratory status: spontaneous breathing, nonlabored ventilation and respiratory function stable Cardiovascular status: blood pressure returned to baseline and stable Postop Assessment: Adequate PO intake and No signs of nausea or vomiting Anesthetic complications: no    Raliegh Ip

## 2019-01-17 NOTE — Anesthesia Procedure Notes (Signed)
Date/Time: 01/17/2019 8:15 AM Performed by: Cameron Ali, CRNA Pre-anesthesia Checklist: Patient identified, Emergency Drugs available, Suction available, Timeout performed and Patient being monitored Patient Re-evaluated:Patient Re-evaluated prior to induction Oxygen Delivery Method: Nasal cannula Placement Confirmation: positive ETCO2

## 2019-01-17 NOTE — Discharge Instructions (Signed)

## 2019-01-17 NOTE — Transfer of Care (Signed)
Immediate Anesthesia Transfer of Care Note  Patient: Tina Mcdonald  Procedure(s) Performed: COLONOSCOPY WITH PROPOFOL (N/A Rectum) ESOPHAGOGASTRODUODENOSCOPY (EGD) WITH PROPOFOL (N/A Throat) POLYPECTOMY (Rectum)  Patient Location: PACU  Anesthesia Type: General  Level of Consciousness: awake, alert  and patient cooperative  Airway and Oxygen Therapy: Patient Spontanous Breathing and Patient connected to supplemental oxygen  Post-op Assessment: Post-op Vital signs reviewed, Patient's Cardiovascular Status Stable, Respiratory Function Stable, Patent Airway and No signs of Nausea or vomiting  Post-op Vital Signs: Reviewed and stable  Complications: No apparent anesthesia complications

## 2019-01-18 ENCOUNTER — Encounter: Payer: Self-pay | Admitting: Gastroenterology

## 2019-01-19 ENCOUNTER — Other Ambulatory Visit: Payer: Self-pay

## 2019-01-19 ENCOUNTER — Ambulatory Visit (INDEPENDENT_AMBULATORY_CARE_PROVIDER_SITE_OTHER): Payer: Medicare Other

## 2019-01-19 DIAGNOSIS — E538 Deficiency of other specified B group vitamins: Secondary | ICD-10-CM

## 2019-01-20 DIAGNOSIS — M5416 Radiculopathy, lumbar region: Secondary | ICD-10-CM | POA: Diagnosis not present

## 2019-01-24 ENCOUNTER — Other Ambulatory Visit: Payer: Self-pay

## 2019-01-24 ENCOUNTER — Ambulatory Visit
Admission: RE | Admit: 2019-01-24 | Discharge: 2019-01-24 | Disposition: A | Discharge status: Home / Self Care | Payer: Medicare Other | Source: Ambulatory Visit | Attending: Student | Admitting: Student

## 2019-01-24 DIAGNOSIS — G8929 Other chronic pain: Secondary | ICD-10-CM | POA: Insufficient documentation

## 2019-01-24 DIAGNOSIS — M5441 Lumbago with sciatica, right side: Secondary | ICD-10-CM | POA: Insufficient documentation

## 2019-01-24 DIAGNOSIS — M545 Low back pain: Secondary | ICD-10-CM | POA: Diagnosis not present

## 2019-01-25 ENCOUNTER — Ambulatory Visit: Payer: Self-pay | Admitting: Pharmacist

## 2019-01-25 ENCOUNTER — Telehealth: Payer: Self-pay

## 2019-01-25 NOTE — Chronic Care Management (AMB) (Addendum)
  Chronic Care Management   Note  01/25/2019 Name: Tina Mcdonald MRN: [id number] DOB: Sep 21, 1940  [name] is a 78 y.o. year old female who is a primary care patient of [name], [name], NP. The CCM team was consulted for assistance with chronic disease management and care coordination needs.    Attempted to contact patient to follow up on medication management. Left HIPAA compliant message for patient to return my call at her convenience.   Follow up plan: - If I do not hear back, will outreach again in the next 3-5 weeks for continued medication management support  Catie Darnelle Maffucci, PharmD Clinical Pharmacist [place] [phone]

## 2019-01-26 ENCOUNTER — Telehealth: Payer: Self-pay

## 2019-02-01 ENCOUNTER — Telehealth: Payer: Self-pay

## 2019-02-09 ENCOUNTER — Ambulatory Visit (INDEPENDENT_AMBULATORY_CARE_PROVIDER_SITE_OTHER): Payer: Medicare Other | Admitting: Nurse Practitioner

## 2019-02-09 ENCOUNTER — Other Ambulatory Visit: Payer: Self-pay

## 2019-02-09 ENCOUNTER — Encounter: Payer: Self-pay | Admitting: Nurse Practitioner

## 2019-02-09 ENCOUNTER — Ambulatory Visit (INDEPENDENT_AMBULATORY_CARE_PROVIDER_SITE_OTHER): Payer: Medicare Other | Admitting: *Deleted

## 2019-02-09 ENCOUNTER — Ambulatory Visit: Payer: Self-pay | Admitting: Pharmacist

## 2019-02-09 VITALS — BP 122/74 | HR 77 | Temp 98.7°F | Ht 62.0 in | Wt 168.0 lb

## 2019-02-09 DIAGNOSIS — D508 Other iron deficiency anemias: Secondary | ICD-10-CM

## 2019-02-09 DIAGNOSIS — E1122 Type 2 diabetes mellitus with diabetic chronic kidney disease: Secondary | ICD-10-CM

## 2019-02-09 DIAGNOSIS — I1310 Hypertensive heart and chronic kidney disease without heart failure, with stage 1 through stage 4 chronic kidney disease, or unspecified chronic kidney disease: Secondary | ICD-10-CM

## 2019-02-09 DIAGNOSIS — IMO0002 Reserved for concepts with insufficient information to code with codable children: Secondary | ICD-10-CM

## 2019-02-09 DIAGNOSIS — E785 Hyperlipidemia, unspecified: Secondary | ICD-10-CM | POA: Diagnosis not present

## 2019-02-09 DIAGNOSIS — E1165 Type 2 diabetes mellitus with hyperglycemia: Secondary | ICD-10-CM

## 2019-02-09 DIAGNOSIS — E1169 Type 2 diabetes mellitus with other specified complication: Secondary | ICD-10-CM | POA: Diagnosis not present

## 2019-02-09 DIAGNOSIS — N183 Chronic kidney disease, stage 3 unspecified: Secondary | ICD-10-CM

## 2019-02-09 LAB — BAYER DCA HB A1C WAIVED: HB A1C (BAYER DCA - WAIVED): 6.1 % (ref <7.0)

## 2019-02-09 NOTE — Patient Instructions (Signed)
Visit Information  Goals Addressed            This Visit's Progress     Patient Stated   . "I want to take care of my medications" (pt-stated)       Current Barriers:  . Polypharmacy; patient on multiple medications for chronic disease state management  o Reports hx diarrhea with iron supplementation; Marnee Guarneri, NP recommended Slow Fe formulation; patient was unable to find Slow Fe 65 mg, so has continued ferrous sulfate 65 mg; reports diarrhea every morning.  o A1c very well controlled today at 6.1%.   Pharmacist Clinical Goal(s):  [name] Over the next 60 days, patient will work with PharmD and primary care provider to address needs related to optimized medication management of chronic conditions  Interventions: . Reviewed that Slow Fe does not come as 65 mg, just 45 mg; however, given significant GI symptoms, recommend trying this dose of Slow Fe ferrous sulfate. Patient verbalized understanding. Recommended she contact [place] [place] to see if she has OTC benefits; she may be able to purchase Slow Fe through these benefits.  . May be able to receive BP cuff through OTC benefits as well. Recommended she pursue this . Metformin being decreased to 500 mg BID d/t well controlled A1c. Will continue to follow BG  Patient Self Care Activities:  . Self administers medications as prescribed . Calls provider office for new concerns or questions  Please see past updates related to this goal by clicking on the "Past Updates" button in the selected goal         The patient verbalized understanding of instructions provided today and declined a print copy of patient instruction materials.   Plan:  - Will outreach patient in 4-5 weeks for medication management support  Catie Darnelle Maffucci, PharmD Clinical Pharmacist [place] [phone]

## 2019-02-09 NOTE — Patient Instructions (Signed)
Carbohydrate Counting for Diabetes Mellitus, Adult  Carbohydrate counting is a method of keeping track of how many carbohydrates you eat. Eating carbohydrates naturally increases the amount of sugar (glucose) in the blood. Counting how many carbohydrates you eat helps keep your blood glucose within normal limits, which helps you manage your diabetes (diabetes mellitus). It is important to know how many carbohydrates you can safely have in each meal. This is different for every person. A diet and nutrition specialist (registered dietitian) can help you make a meal plan and calculate how many carbohydrates you should have at each meal and snack. Carbohydrates are found in the following foods:  Grains, such as breads and cereals.  Dried beans and soy products.  Starchy vegetables, such as potatoes, peas, and corn.  Fruit and fruit juices.  Milk and yogurt.  Sweets and snack foods, such as cake, cookies, candy, chips, and soft drinks. How do I count carbohydrates? There are two ways to count carbohydrates in food. You can use either of the methods or a combination of both. Reading "Nutrition Facts" on packaged food The "Nutrition Facts" list is included on the labels of almost all packaged foods and beverages in the [place] It includes:  The serving size.  Information about nutrients in each serving, including the grams (g) of carbohydrate per serving. To use the "Nutrition Facts":  Decide how many servings you will have.  Multiply the number of servings by the number of carbohydrates per serving.  The resulting number is the total amount of carbohydrates that you will be having. Learning standard serving sizes of other foods When you eat carbohydrate foods that are not packaged or do not include "Nutrition Facts" on the label, you need to measure the servings in order to count the amount of carbohydrates:  Measure the foods that you will eat with a food scale or measuring cup, if needed.   Decide how many standard-size servings you will eat.  Multiply the number of servings by 15. Most carbohydrate-rich foods have about 15 g of carbohydrates per serving. ? For example, if you eat 8 oz (170 g) of strawberries, you will have eaten 2 servings and 30 g of carbohydrates (2 servings x 15 g = 30 g).  For foods that have more than one food mixed, such as soups and casseroles, you must count the carbohydrates in each food that is included. The following list contains standard serving sizes of common carbohydrate-rich foods. Each of these servings has about 15 g of carbohydrates:   hamburger bun or  English muffin.   oz (15 mL) syrup.   oz (14 g) jelly.  1 slice of bread.  1 six-inch tortilla.  3 oz (85 g) cooked rice or pasta.  4 oz (113 g) cooked dried beans.  4 oz (113 g) starchy vegetable, such as peas, corn, or potatoes.  4 oz (113 g) hot cereal.  4 oz (113 g) mashed potatoes or  of a large baked potato.  4 oz (113 g) canned or frozen fruit.  4 oz (120 mL) fruit juice.  4-6 crackers.  6 chicken nuggets.  6 oz (170 g) unsweetened dry cereal.  6 oz (170 g) plain fat-free yogurt or yogurt sweetened with artificial sweeteners.  8 oz (240 mL) milk.  8 oz (170 g) fresh fruit or one small piece of fruit.  24 oz (680 g) popped popcorn. Example of carbohydrate counting Sample meal  3 oz (85 g) chicken breast.  6 oz (170 g)   brown rice.  4 oz (113 g) corn.  8 oz (240 mL) milk.  8 oz (170 g) strawberries with sugar-free whipped topping. Carbohydrate calculation 1. Identify the foods that contain carbohydrates: ? Rice. ? Corn. ? Milk. ? Strawberries. 2. Calculate how many servings you have of each food: ? 2 servings rice. ? 1 serving corn. ? 1 serving milk. ? 1 serving strawberries. 3. Multiply each number of servings by 15 g: ? 2 servings rice x 15 g = 30 g. ? 1 serving corn x 15 g = 15 g. ? 1 serving milk x 15 g = 15 g. ? 1 serving  strawberries x 15 g = 15 g. 4. Add together all of the amounts to find the total grams of carbohydrates eaten: ? 30 g + 15 g + 15 g + 15 g = 75 g of carbohydrates total. Summary  Carbohydrate counting is a method of keeping track of how many carbohydrates you eat.  Eating carbohydrates naturally increases the amount of sugar (glucose) in the blood.  Counting how many carbohydrates you eat helps keep your blood glucose within normal limits, which helps you manage your diabetes.  A diet and nutrition specialist (registered dietitian) can help you make a meal plan and calculate how many carbohydrates you should have at each meal and snack. This information is not intended to replace advice given to you by your health care provider. Make sure you discuss any questions you have with your health care provider. Document Released: 06/09/2005 Document Revised: 01/01/2017 Document Reviewed: 11/21/2015 Elsevier Patient Education  2020 Elsevier Inc.  

## 2019-02-09 NOTE — Progress Notes (Signed)
BP 122/74 (BP Location: Right Arm, Patient Position: Sitting)   Pulse 77   Temp 98.7 F (37.1 C) (Oral)   Ht 5\' 2"  (1.575 m)   Wt 168 lb (76.2 kg)   LMP  (LMP Unknown)   SpO2 98%   BMI 30.73 kg/m    Subjective:    Patient ID: Tina Mcdonald, female    DOB: Mar 04, 1941, 78 y.o.   MRN: [id number]  HPI: Tina Mcdonald is a 78 y.o. female  Chief Complaint  Patient presents with  . Diabetes  . Hyperlipidemia  . Hypertension   DIABETES Continues on Metformin and Gabapentin.  Last A1C 6.4% in May. Continues to have issues with diarrhea in the morning and at times during daytime, since starting iron pills for anemia.  Discussed Slow Fe use with her.  She has seen GI and colonoscopy and EGD showed benign findings on review.  Does have a hiatal hernia. Hypoglycemic episodes:no Polydipsia/polyuria: no Visual disturbance: no Chest pain: no Paresthesias: no Glucose Monitoring: yes  Accucheck frequency: Daily  Fasting glucose: 99 - 120 ==== this morning was 120  Post prandial:  Evening:  Before meals: Taking Insulin?: no  Long acting insulin:  Short acting insulin: Blood Pressure Monitoring: not checking Retinal Examination: Up to Date Foot Exam: Up to Date Pneumovax: Up to Date Influenza: Up to Date Aspirin: no   HYPERTENSION / HYPERLIPIDEMIA Continues on HCTZ and Clonidine + Lipitor. Satisfied with current treatment? yes Duration of hypertension: chronic BP monitoring frequency: not checking BP range:  BP medication side effects: no Duration of hyperlipidemia: chronic Cholesterol medication side effects: no Cholesterol supplements: none Medication compliance: good compliance Aspirin: no Recent stressors: no Recurrent headaches: no Visual changes: no Palpitations: no Dyspnea: no Chest pain: no Lower extremity edema: no Dizzy/lightheaded: no  Relevant past medical, surgical, family and social history reviewed and updated as indicated. Interim medical history  since our last visit reviewed. Allergies and medications reviewed and updated.  Review of Systems  Constitutional: Negative for activity change, appetite change, diaphoresis, fatigue and fever.  Respiratory: Negative for cough, chest tightness and shortness of breath.   Cardiovascular: Negative for chest pain, palpitations and leg swelling.  Gastrointestinal: Negative for abdominal distention, abdominal pain, constipation, diarrhea, nausea and vomiting.  Endocrine: Negative for cold intolerance, heat intolerance, polydipsia, polyphagia and polyuria.  Neurological: Negative for dizziness, syncope, weakness, light-headedness, numbness and headaches.  Psychiatric/Behavioral: Negative.     Per HPI unless specifically indicated above     Objective:    BP 122/74 (BP Location: Right Arm, Patient Position: Sitting)   Pulse 77   Temp 98.7 F (37.1 C) (Oral)   Ht 5\' 2"  (1.575 m)   Wt 168 lb (76.2 kg)   LMP  (LMP Unknown)   SpO2 98%   BMI 30.73 kg/m   Wt Readings from Last 3 Encounters:  02/09/19 168 lb (76.2 kg)  01/17/19 163 lb (73.9 kg)  12/14/18 168 lb 6.4 oz (76.4 kg)    Physical Exam Vitals signs and nursing note reviewed.  Constitutional:      General: She is awake. She is not in acute distress.    Appearance: She is well-developed. She is not ill-appearing.  HENT:     Head: Normocephalic.     Right Ear: Hearing normal.     Left Ear: Hearing normal.  Eyes:     General: Lids are normal.        Right eye: No discharge.  Left eye: No discharge.     Conjunctiva/sclera: Conjunctivae normal.     Pupils: Pupils are equal, round, and reactive to light.  Neck:     Musculoskeletal: Normal range of motion and neck supple.     Thyroid: No thyromegaly.     Vascular: No carotid bruit.  Cardiovascular:     Rate and Rhythm: Normal rate and regular rhythm.     Heart sounds: Normal heart sounds. No murmur. No gallop.   Pulmonary:     Effort: Pulmonary effort is normal. No  accessory muscle usage or respiratory distress.     Breath sounds: Normal breath sounds.  Abdominal:     General: Bowel sounds are normal.     Palpations: Abdomen is soft. There is no hepatomegaly or splenomegaly.     Tenderness: There is no abdominal tenderness.  Musculoskeletal:     Right lower leg: No edema.     Left lower leg: No edema.  Lymphadenopathy:     Cervical: No cervical adenopathy.  Skin:    General: Skin is warm and dry.  Neurological:     Mental Status: She is alert and oriented to person, place, and time.  Psychiatric:        Attention and Perception: Attention normal.        Mood and Affect: Mood normal.        Speech: Speech normal.        Behavior: Behavior normal. Behavior is cooperative.        Thought Content: Thought content normal.        Cognition and Memory: Cognition normal.        Judgment: Judgment normal.     Results for orders placed or performed during the hospital encounter of 01/17/19  Glucose, capillary  Result Value Ref Range   Glucose-Capillary 101 (H) 70 - 99 mg/dL  Glucose, capillary  Result Value Ref Range   Glucose-Capillary 124 (H) 70 - 99 mg/dL      Assessment & Plan:   Problem List Items Addressed This Visit      Cardiovascular and Mediastinum   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[facility])    Chronic, with initial BP elevated but repeat at goal. Continue to recommend checking BP at home a few mornings a week and documenting.  Continue current medication regimen.  CMP today.        Endocrine   Uncontrolled type 2 diabetes mellitus with chronic kidney disease ([facility]) - Primary    Chronic, ongoing with A1C continuing to decrease at 6.1% today.  Have recommended decreasing Metformin to 500 MG twice a day and continuing to monitor blood sugar daily.  Will see back in 4 weeks and assess how dose reduction is going.       Relevant Orders   [facility] DCA Hb A1c Waived   Hyperlipidemia associated with type  2 diabetes mellitus ([facility])    Chronic, ongoing.  Continue current medication regimen and adjust as needed.  Lipid panel today.      Relevant Orders   [facility] DCA Hb A1c Waived   Comprehensive metabolic panel   Lipid Panel w/o Chol/HDL Ratio       Follow up plan: Return in about 4 weeks (around 03/09/2019) for T2DM with med changes and also schedule a 3 month for T2DM, HTN/HLD.

## 2019-02-09 NOTE — Assessment & Plan Note (Signed)
Chronic, ongoing.  Continue current medication regimen and adjust as needed. Lipid panel today. 

## 2019-02-09 NOTE — Chronic Care Management (AMB) (Signed)
Chronic Care Management   Follow Up Note   02/09/2019 Name: Tina Mcdonald MRN: [id number] DOB: 1941/05/03  Referred by: [name], NP Reason for referral : No chief complaint on file.   Tina Mcdonald is a 78 y.o. year old female who is a primary care patient of [name], [name], NP. The CCM team was consulted for assistance with chronic disease management and care coordination needs.    Review of patient status, including review of consultants reports, relevant laboratory and other test results, and collaboration with appropriate care team members and the patient's provider was performed as part of comprehensive patient evaluation and provision of chronic care management services.    SDOH (Social Determinants of Health) screening performed today: Financial Strain . See Care Plan for related entries.   Wt Readings from Last 3 Encounters:  02/09/19 168 lb (76.2 kg)  01/17/19 163 lb (73.9 kg)  12/14/18 168 lb 6.4 oz (76.4 kg)  03/2019           180lb  Outpatient Encounter Medications as of 02/09/2019  Medication Sig  . acetaminophen (TYLENOL) 325 MG tablet Take 650 mg by mouth every 6 (six) hours as needed.  [name] atorvastatin (LIPITOR) 20 MG tablet Take 1 tablet by mouth once daily  . blood glucose meter kit and supplies KIT Dispense based on patient and insurance preference. Use up to four times daily as directed. (FOR ICD-9 250.00, 250.01).  . cloNIDine (CATAPRES) 0.1 MG tablet Take 0.1 mg by mouth 2 (two) times daily.  . diclofenac sodium (VOLTAREN) 1 % GEL Apply 2 g topically 2 (two) times daily as needed.  . ferrous sulfate 325 (65 FE) MG EC tablet Take 325 mg by mouth every other day.   . gabapentin (NEURONTIN) 100 MG capsule Take 3 capsules (300 mg total) by mouth at bedtime. (Patient taking differently: Take 200 mg by mouth at bedtime. )  . glucose blood test strip Check daily. ICD 10 E11.9  . hydrochlorothiazide (HYDRODIURIL) 25 MG tablet Take 1 tablet by mouth once  daily  . metFORMIN (GLUCOPHAGE-XR) 500 MG 24 hr tablet Take 2 tablets (1,000 mg total) by mouth 2 (two) times daily.  . metoprolol succinate (TOPROL-XL) 50 MG 24 hr tablet Take 1 tablet by mouth once daily  . omeprazole (PRILOSEC) 20 MG capsule Take 1 capsule by mouth once daily   Facility-Administered Encounter Medications as of 02/09/2019  Medication  . cyanocobalamin ((VITAMIN B-12)) injection 1,000 mcg     Goals Addressed            This Visit's Progress   . I am having diarhea every morning (pt-stated)       Current Barriers:  [name] Knowledge Deficits related to why she is having diarrhea consistently in the mornings. . Film/video editor.  . Patient expressed concern over weight loss(12lbs in 1 year)   Nurse Case Manager Clinical Goal(s):  [name] Over the next 90 days, patient will work with [facility]  to address needs related to diarrhea   Interventions:  . Reviewed medications with patient and discussed potential medication causes.Patient reports symptom started at the same time she started iron. Nash Dimmer with PCP and CCM pharmacist regarding potential causes, PCP reduced Metformin dose related to patient's A1C 6 today. Pharmacist and PCP discussed with patient trying Slow Fe to see if this made a difference. Also discussed patient's OTC benefit with her [facility] medicare.  . Discussed plans with patient for ongoing care management follow up and  provided patient with direct contact information for care management team . Informed patient her weight loss was 12lbs over the last year.  . Plan to f/u to see if medication changes improved symptoms.   Patient Self Care Activities:  . Currently UNABLE TO independently control diarhea   Initial goal documentation     . COMPLETED: I can't afford my c-pap supplies (pt-stated)       Current Barriers:  [name] Knowledge Deficits related to on how to obtain cpap and supplies affordably  . Film/video editor.  . Difficulty navigating the Sleep  Study process  Nurse Case Manager Clinical Goal(s):  [name] Over the next 90 days, patient will work with [facility] to address needs related to OSA  Interventions:  . Collaborated with [facility] regarding patient's last sleep study in 2018. Spoke with AJ [phone]) he states patient still needs to complete more of the sleep study process and this would be further expense upwards of 250-300$ related to insurance is 80/20 for Cpap and supplies. AJ states if patient would like to use last performed sleep study PCP would need to fill out a form he could provide.  . Discussed plans with patient for ongoing care management follow up and provided patient with direct contact information for care management team . Plan to discuss with patient options related to OSA/Sleep study at next contact . Spoke to patient about cpap/process and patient stated she did not have the money to spend on this right now.   Patient Self Care Activities:  . Currently UNABLE TO independently navigate sleep study process to obtain cpap supplies  Initial goal documentation     . COMPLETED: I haven't  been able to make my leg pain appointment (pt-stated)       Current Barriers:  [name] Knowledge Deficits related to obtaining appointment for leg/back pain . Referral sent to [facility] April 2020 patient was unable to remember the name of the office and did not have call back number  Nurse Case Manager Clinical Goal(s):  [name] Over the next 90 days, patient will work with [facility] to address needs related to f/u with [facility] for leg/back pain.  Interventions:  . Advised patient to to call [facility] to set up appointment . Collaborated with Barbaraann Share with [facility] regarding patient's referral, she stated they had called patient and did not receive a call back yet. . Discussed plans with patient for ongoing care management follow up and provided patient with direct contact information for care management team .  Patient has seen ortho and is being treated.    Patient Self Care Activities:  . Currently UNABLE TO independently navigate health system to set up appointment for leg/back pain  Please see past updates related to this goal by clicking on the "Past Updates" button in the selected goal          The care management team will reach out to the patient again over the next 30 days.  The patient has been provided with contact information for the care management team and has been advised to call with any health related questions or concerns.    Merlene Morse Dakotah Heiman RN, BSN Nurse Case Editor, [facility]  [phone]) Business Mobile

## 2019-02-09 NOTE — Assessment & Plan Note (Signed)
Chronic, with initial BP elevated but repeat at goal. Continue to recommend checking BP at home a few mornings a week and documenting.  Continue current medication regimen.  CMP today.

## 2019-02-09 NOTE — Assessment & Plan Note (Signed)
Chronic, ongoing with A1C continuing to decrease at 6.1% today.  Have recommended decreasing Metformin to 500 MG twice a day and continuing to monitor blood sugar daily.  Will see back in 4 weeks and assess how dose reduction is going.

## 2019-02-09 NOTE — Patient Instructions (Signed)
Thank you allowing the Chronic Care Management Team to be a part of your care! It was a pleasure speaking with you today!  CCM (Chronic Care Management) Team   Kenidi Elenbaas RN, BSN Nurse Care Coordinator  [phone]  Catie [facility] PharmD  Clinical Pharmacist  [phone]  Eula Fried LCSW Clinical Social Worker [phone]  Goals Addressed            This Visit's Progress   . I am having diarhea every morning (pt-stated)       Current Barriers:  [name] Knowledge Deficits related to why she is having diarrhea consistently in the mornings. . Film/video editor.  . Patient expressed concern over weight loss(12lbs in 1 year)   Nurse Case Manager Clinical Goal(s):  [name] Over the next 90 days, patient will work with [facility]  to address needs related to diarrhea   Interventions:  . Reviewed medications with patient and discussed potential medication causes.Patient reports symptom started at the same time she started iron. Nash Dimmer with PCP and CCM pharmacist regarding potential causes, PCP reduced Metformin dose related to patient's A1C 6 today. Pharmacist and PCP discussed with patient trying Slow Fe to see if this made a difference. Also discussed patient's OTC benefit with her [facility] medicare.  . Discussed plans with patient for ongoing care management follow up and provided patient with direct contact information for care management team . Informed patient her weight loss was 12lbs over the last year.  . Plan to f/u to see if medication changes improved symptoms.   Patient Self Care Activities:  . Currently UNABLE TO independently control diarhea   Initial goal documentation     . COMPLETED: I can't afford my c-pap supplies (pt-stated)       Current Barriers:  [name] Knowledge Deficits related to on how to obtain cpap and supplies affordably  . Film/video editor.  . Difficulty navigating the Sleep Study process  Nurse Case Manager Clinical Goal(s):  [name] Over the next 90  days, patient will work with [facility] to address needs related to OSA  Interventions:  . Collaborated with [facility] regarding patient's last sleep study in 2018. Spoke with AJ [phone] ext [phone]) he states patient still needs to complete more of the sleep study process and this would be further expense upwards of 250-300$ related to insurance is 80/20 for Cpap and supplies. AJ states if patient would like to use last performed sleep study PCP would need to fill out a form he could provide.  . Discussed plans with patient for ongoing care management follow up and provided patient with direct contact information for care management team . Plan to discuss with patient options related to OSA/Sleep study at next contact . Spoke to patient about cpap/process and patient stated she did not have the money to spend on this right now.   Patient Self Care Activities:  . Currently UNABLE TO independently navigate sleep study process to obtain cpap supplies  Initial goal documentation     . COMPLETED: I haven't  been able to make my leg pain appointment (pt-stated)       Current Barriers:  [name] Knowledge Deficits related to obtaining appointment for leg/back pain . Referral sent to [facility] April 2020 patient was unable to remember the name of the office and did not have call back number  Nurse Case Manager Clinical Goal(s):  [name] Over the next 90 days, patient will work with [facility] to address needs related to  f/u with [facility] for leg/back pain.  Interventions:  . Advised patient to to call [facility] to set up appointment . Collaborated with Barbaraann Share with [facility] regarding patient's referral, she stated they had called patient and did not receive a call back yet. . Discussed plans with patient for ongoing care management follow up and provided patient with direct contact information for care management team . Patient has seen ortho and is being treated.    Patient Self Care  Activities:  . Currently UNABLE TO independently navigate health system to set up appointment for leg/back pain  Please see past updates related to this goal by clicking on the "Past Updates" button in the selected goal         The patient verbalized understanding of instructions provided today and declined a print copy of patient instruction materials.   The patient has been provided with contact information for the care management team and has been advised to call with any health related questions or concerns.

## 2019-02-09 NOTE — Chronic Care Management (AMB) (Signed)
Chronic Care Management   Follow Up Note   02/09/2019 Name: Tina Mcdonald MRN: [id number] DOB: 04-15-1941  Referred by: [name], NP Reason for referral : Chronic Care Management (Medication Management)   Tina Mcdonald is a 78 y.o. year old female who is a primary care patient of [name], Tina Faster, NP. The CCM team was consulted for assistance with chronic disease management and care coordination needs.    Met with patient face to face today during primary care provider appointment with RN CM.   Review of patient status, including review of consultants reports, relevant laboratory and other test results, and collaboration with appropriate care team members and the patient's provider was performed as part of comprehensive patient evaluation and provision of chronic care management services.     Outpatient Encounter Medications as of 02/09/2019  Medication Sig   acetaminophen (TYLENOL) 325 MG tablet Take 650 mg by mouth every 6 (six) hours as needed.   atorvastatin (LIPITOR) 20 MG tablet Take 1 tablet by mouth once daily   blood glucose meter kit and supplies KIT Dispense based on patient and insurance preference. Use up to four times daily as directed. (FOR ICD-9 250.00, 250.01).   cloNIDine (CATAPRES) 0.1 MG tablet Take 0.1 mg by mouth 2 (two) times daily.   diclofenac sodium (VOLTAREN) 1 % GEL Apply 2 g topically 2 (two) times daily as needed.   ferrous sulfate 325 (65 FE) MG EC tablet Take 325 mg by mouth every other day.    gabapentin (NEURONTIN) 100 MG capsule Take 3 capsules (300 mg total) by mouth at bedtime. (Patient taking differently: Take 200 mg by mouth at bedtime. )   glucose blood test strip Check daily. ICD 10 E11.9   hydrochlorothiazide (HYDRODIURIL) 25 MG tablet Take 1 tablet by mouth once daily   metFORMIN (GLUCOPHAGE-XR) 500 MG 24 hr tablet Take 2 tablets (1,000 mg total) by mouth 2 (two) times daily.   metoprolol succinate (TOPROL-XL) 50 MG 24  hr tablet Take 1 tablet by mouth once daily   omeprazole (PRILOSEC) 20 MG capsule Take 1 capsule by mouth once daily   Facility-Administered Encounter Medications as of 02/09/2019  Medication   cyanocobalamin ((VITAMIN B-12)) injection 1,000 mcg     Goals Addressed            This Visit's Progress     Patient Stated    "I want to take care of my medications" (pt-stated)       Current Barriers:   Polypharmacy; patient on multiple medications for chronic disease state management  o Reports hx diarrhea with iron supplementation; Marnee Guarneri, NP recommended Slow Fe formulation; patient was unable to find Slow Fe 65 mg, so has continued ferrous sulfate 65 mg; reports diarrhea every morning.  o A1c very well controlled today at 6.1%.   Pharmacist Clinical Goal(s):   Over the next 60 days, patient will work with PharmD and primary care provider to address needs related to optimized medication management of chronic conditions  Interventions:  Reviewed that Slow Fe does not come as 65 mg, just 45 mg; however, given significant GI symptoms, recommend trying this dose of Slow Fe ferrous sulfate. Patient verbalized understanding. Recommended she contact [place] to see if she has OTC benefits; she may be able to purchase Slow Fe through these benefits.   May be able to receive BP cuff through OTC benefits as well. Recommended she pursue this  Metformin being decreased to 500 mg BID d/t  well controlled A1c. Will continue to follow BG  Patient Self Care Activities:   Self administers medications as prescribed  Calls provider office for new concerns or questions  Please see past updates related to this goal by clicking on the "Past Updates" button in the selected goal          Plan:  - Will outreach patient in 4-5 weeks for medication management support  Catie Darnelle Maffucci, PharmD Clinical Pharmacist [place] [phone]

## 2019-02-10 ENCOUNTER — Other Ambulatory Visit: Payer: Self-pay | Admitting: Nurse Practitioner

## 2019-02-10 LAB — COMPREHENSIVE METABOLIC PANEL
ALT: 15 IU/L (ref 0–32)
AST: 14 IU/L (ref 0–40)
Albumin/Globulin Ratio: 1.7 (ref 1.2–2.2)
Albumin: 4.2 g/dL (ref 3.7–4.7)
Alkaline Phosphatase: 67 IU/L (ref 39–117)
BUN/Creatinine Ratio: 21 (ref 12–28)
BUN: 23 mg/dL (ref 8–27)
Bilirubin Total: 0.2 mg/dL (ref 0.0–1.2)
CO2: 23 mmol/L (ref 20–29)
Calcium: 9.5 mg/dL (ref 8.7–10.3)
Chloride: 104 mmol/L (ref 96–106)
Creatinine, Ser: 1.07 mg/dL — ABNORMAL HIGH (ref 0.57–1.00)
GFR calc Af Amer: 58 mL/min/{1.73_m2} — ABNORMAL LOW (ref >59)
GFR calc non Af Amer: 51 mL/min/{1.73_m2} — ABNORMAL LOW (ref >59)
Globulin, Total: 2.5 g/dL (ref 1.5–4.5)
Glucose: 102 mg/dL — ABNORMAL HIGH (ref 65–99)
Potassium: 4.2 mmol/L (ref 3.5–5.2)
Sodium: 143 mmol/L (ref 134–144)
Total Protein: 6.7 g/dL (ref 6.0–8.5)

## 2019-02-10 LAB — LIPID PANEL W/O CHOL/HDL RATIO
Cholesterol, Total: 187 mg/dL (ref 100–199)
HDL: 49 mg/dL (ref >39)
LDL Calculated: 117 mg/dL — ABNORMAL HIGH (ref 0–99)
Triglycerides: 104 mg/dL (ref 0–149)
VLDL Cholesterol Cal: 21 mg/dL (ref 5–40)

## 2019-02-10 MED ORDER — ATORVASTATIN CALCIUM 40 MG PO TABS: 40.0000 mg | ORAL_TABLET | Freq: Every day | ORAL | 3 refills | Status: DC

## 2019-02-10 NOTE — Progress Notes (Signed)
Atorvastatin dose change.

## 2019-02-17 ENCOUNTER — Other Ambulatory Visit: Payer: Self-pay

## 2019-02-17 ENCOUNTER — Ambulatory Visit (INDEPENDENT_AMBULATORY_CARE_PROVIDER_SITE_OTHER): Payer: Medicare Other

## 2019-02-17 DIAGNOSIS — E538 Deficiency of other specified B group vitamins: Secondary | ICD-10-CM | POA: Diagnosis not present

## 2019-02-22 ENCOUNTER — Telehealth: Payer: Self-pay

## 2019-02-24 DIAGNOSIS — M79604 Pain in right leg: Secondary | ICD-10-CM | POA: Diagnosis not present

## 2019-03-01 ENCOUNTER — Telehealth: Payer: Self-pay

## 2019-03-01 NOTE — Telephone Encounter (Signed)
Unable to reach pt by phone. Letter mailed.

## 2019-03-03 ENCOUNTER — Other Ambulatory Visit: Payer: Self-pay | Admitting: Nurse Practitioner

## 2019-03-08 ENCOUNTER — Ambulatory Visit: Payer: Medicare Other | Admitting: Gastroenterology

## 2019-03-14 ENCOUNTER — Other Ambulatory Visit: Payer: Self-pay | Admitting: Nurse Practitioner

## 2019-03-14 ENCOUNTER — Ambulatory Visit (INDEPENDENT_AMBULATORY_CARE_PROVIDER_SITE_OTHER): Payer: Medicare Other

## 2019-03-14 DIAGNOSIS — Z0000 Encounter for general adult medical examination without abnormal findings: Secondary | ICD-10-CM | POA: Diagnosis not present

## 2019-03-14 NOTE — Patient Instructions (Addendum)
Tina Mcdonald , Thank you for taking time to come for your Medicare Wellness Visit. I appreciate your ongoing commitment to your health goals. Please review the following plan we discussed and let me know if I can assist you in the future.   Screening recommendations/referrals: Colonoscopy: no longer required Mammogram: no longer required Bone Density: up to date Recommended yearly ophthalmology/optometry visit for glaucoma screening and checkup Recommended yearly dental visit for hygiene and checkup  Vaccinations: Influenza vaccine: due now- declined Pneumococcal vaccine: up to date Tdap vaccine: due now, check with your insurance for coverage  Shingles vaccine: shingrix eligible  Advanced directives: Please bring a copy of your health care power of attorney and living will to the office at your convenience.  Conditions/risks identified: diabetic, discussed chronic care management team.   Next appointment: follow up in one year for your annual wellness visit.    Preventive Care 76 Years and Older, Female Preventive care refers to lifestyle choices and visits with your health care provider that can promote health and wellness. What does preventive care include?  A yearly physical exam. This is also called an annual well check.  Dental exams once or twice a year.  Routine eye exams. Ask your health care provider how often you should have your eyes checked.  Personal lifestyle choices, including:  Daily care of your teeth and gums.  Regular physical activity.  Eating a healthy diet.  Avoiding tobacco and drug use.  Limiting alcohol use.  Practicing safe sex.  Taking low-dose aspirin every day.  Taking vitamin and mineral supplements as recommended by your health care provider. What happens during an annual well check? The services and screenings done by your health care provider during your annual well check will depend on your age, overall health, lifestyle risk  factors, and family history of disease. Counseling  Your health care provider may ask you questions about your:  Alcohol use.  Tobacco use.  Drug use.  Emotional well-being.  Home and relationship well-being.  Sexual activity.  Eating habits.  History of falls.  Memory and ability to understand (cognition).  Work and work Statistician.  Reproductive health. Screening  You may have the following tests or measurements:  Height, weight, and BMI.  Blood pressure.  Lipid and cholesterol levels. These may be checked every 5 years, or more frequently if you are over 76 years old.  Skin check.  Lung cancer screening. You may have this screening every year starting at age 35 if you have a 30-pack-year history of smoking and currently smoke or have quit within the past 15 years.  Fecal occult blood test (FOBT) of the stool. You may have this test every year starting at age 76.  Flexible sigmoidoscopy or colonoscopy. You may have a sigmoidoscopy every 5 years or a colonoscopy every 10 years starting at age 76.  Hepatitis C blood test.  Hepatitis B blood test.  Sexually transmitted disease (STD) testing.  Diabetes screening. This is done by checking your blood sugar (glucose) after you have not eaten for a while (fasting). You may have this done every 1-3 years.  Bone density scan. This is done to screen for osteoporosis. You may have this done starting at age 76.  Mammogram. This may be done every 1-2 years. Talk to your health care provider about how often you should have regular mammograms. Talk with your health care provider about your test results, treatment options, and if necessary, the need for more tests. Vaccines  Your health care provider may recommend certain vaccines, such as:  Influenza vaccine. This is recommended every year.  Tetanus, diphtheria, and acellular pertussis (Tdap, Td) vaccine. You may need a Td booster every 10 years.  Zoster vaccine. You  may need this after age 26.  Pneumococcal 13-valent conjugate (PCV13) vaccine. One dose is recommended after age 24.  Pneumococcal polysaccharide (PPSV23) vaccine. One dose is recommended after age 3. Talk to your health care provider about which screenings and vaccines you need and how often you need them. This information is not intended to replace advice given to you by your health care provider. Make sure you discuss any questions you have with your health care provider. Document Released: 07/06/2015 Document Revised: 02/27/2016 Document Reviewed: 04/10/2015 Elsevier Interactive Patient Education  2017 [place] Prevention in the Home Falls can cause injuries. They can happen to people of all ages. There are many things you can do to make your home safe and to help prevent falls. What can I do on the outside of my home?  Regularly fix the edges of walkways and driveways and fix any cracks.  Remove anything that might make you trip as you walk through a door, such as a raised step or threshold.  Trim any bushes or trees on the path to your home.  Use bright outdoor lighting.  Clear any walking paths of anything that might make someone trip, such as rocks or tools.  Regularly check to see if handrails are loose or broken. Make sure that both sides of any steps have handrails.  Any raised decks and porches should have guardrails on the edges.  Have any leaves, snow, or ice cleared regularly.  Use sand or salt on walking paths during winter.  Clean up any spills in your garage right away. This includes oil or grease spills. What can I do in the bathroom?  Use night lights.  Install grab bars by the toilet and in the tub and shower. Do not use towel bars as grab bars.  Use non-skid mats or decals in the tub or shower.  If you need to sit down in the shower, use a plastic, non-slip stool.  Keep the floor dry. Clean up any water that spills on the floor as soon as  it happens.  Remove soap buildup in the tub or shower regularly.  Attach bath mats securely with double-sided non-slip rug tape.  Do not have throw rugs and other things on the floor that can make you trip. What can I do in the bedroom?  Use night lights.  Make sure that you have a light by your bed that is easy to reach.  Do not use any sheets or blankets that are too big for your bed. They should not hang down onto the floor.  Have a firm chair that has side arms. You can use this for support while you get dressed.  Do not have throw rugs and other things on the floor that can make you trip. What can I do in the kitchen?  Clean up any spills right away.  Avoid walking on wet floors.  Keep items that you use a lot in easy-to-reach places.  If you need to reach something above you, use a strong step stool that has a grab bar.  Keep electrical cords out of the way.  Do not use floor polish or wax that makes floors slippery. If you must use wax, use non-skid floor wax.  Do  not have throw rugs and other things on the floor that can make you trip. What can I do with my stairs?  Do not leave any items on the stairs.  Make sure that there are handrails on both sides of the stairs and use them. Fix handrails that are broken or loose. Make sure that handrails are as long as the stairways.  Check any carpeting to make sure that it is firmly attached to the stairs. Fix any carpet that is loose or worn.  Avoid having throw rugs at the top or bottom of the stairs. If you do have throw rugs, attach them to the floor with carpet tape.  Make sure that you have a light switch at the top of the stairs and the bottom of the stairs. If you do not have them, ask someone to add them for you. What else can I do to help prevent falls?  Wear shoes that:  Do not have high heels.  Have rubber bottoms.  Are comfortable and fit you well.  Are closed at the toe. Do not wear sandals.  If  you use a stepladder:  Make sure that it is fully opened. Do not climb a closed stepladder.  Make sure that both sides of the stepladder are locked into place.  Ask someone to hold it for you, if possible.  Clearly mark and make sure that you can see:  Any grab bars or handrails.  First and last steps.  Where the edge of each step is.  Use tools that help you move around (mobility aids) if they are needed. These include:  Canes.  Walkers.  Scooters.  Crutches.  Turn on the lights when you go into a dark area. Replace any light bulbs as soon as they burn out.  Set up your furniture so you have a clear path. Avoid moving your furniture around.  If any of your floors are uneven, fix them.  If there are any pets around you, be aware of where they are.  Review your medicines with your doctor. Some medicines can make you feel dizzy. This can increase your chance of falling. Ask your doctor what other things that you can do to help prevent falls. This information is not intended to replace advice given to you by your health care provider. Make sure you discuss any questions you have with your health care provider. Document Released: 04/05/2009 Document Revised: 11/15/2015 Document Reviewed: 07/14/2014 Elsevier Interactive Patient Education  2017 [employer].

## 2019-03-14 NOTE — Progress Notes (Signed)
Subjective:   Tina Mcdonald is a 78 y.o. female who presents for Medicare Annual (Subsequent) preventive examination.  This visit is being conducted via phone call  - after an attmept to do on video chat - due to the COVID-19 pandemic. This patient has given me verbal consent via phone to conduct this visit, patient states they are participating from their home address. Some vital signs may be absent or patient reported.   Patient identification: identified by name, DOB, and current address.    Review of Systems:   Cardiac Risk Factors include: advanced age (>87mn, >>77women);dyslipidemia;hypertension;diabetes mellitus     Objective:     Vitals: LMP  (LMP Unknown)   There is no height or weight on file to calculate BMI.  Advanced Directives 03/14/2019 01/17/2019 12/14/2018 03/04/2018 12/08/2017 02/05/2017 02/05/2017  Does Patient Have a Medical Advance Directive? Yes Yes Yes Yes Yes No No;Yes  Type of Advance Directive Living will;Healthcare Power of [place] will Healthcare Power of Attorney Living will;Healthcare Power of Attorney Living will;Healthcare Power of [place] will  Does patient want to make changes to medical advance directive? - No - Patient declined - - - - -  Copy of [place] Chart? No - copy requested No - copy requested No - copy requested No - copy requested - - No - copy requested    Tobacco Social History   Tobacco Use  Smoking Status Former Smoker  . Packs/day: 1.00  . Years: 15.00  . Pack years: 15.00  . Quit date: 11/22/1997  . Years since quitting: 21.3  Smokeless Tobacco Never Used     Counseling given: Not Answered   Clinical Intake:  Pre-visit preparation completed: Yes  Pain : 0-10 Pain Score: 5  Pain Type: Chronic pain Pain Location: Back Pain Descriptors / Indicators: Aching Pain Onset: More than a month  ago Pain Frequency: Constant     Nutritional Risks: None Diabetes: No  How often do you need to have someone help you when you read instructions, pamphlets, or other written materials from your doctor or pharmacy?: 1 - Never  Nutrition Risk Assessment:  Has the patient had any N/V/D within the last 2 months?  No  Does the patient have any non-healing wounds?  No  Has the patient had any unintentional weight loss or weight gain?  No   Diabetes:  Is the patient diabetic?  Yes  If diabetic, was a CBG obtained today?  No  Did the patient bring in their glucometer from home?  No  How often do you monitor your CBG's? Daily   Financial Strains and Diabetes Management:  Are you having any financial strains with the device, your supplies or your medication? No .  Does the patient want to be seen by Chronic Care Management for management of their diabetes?  Yes  Would the patient like to be referred to a Nutritionist or for Diabetic Management?  Yes   In contact with ccm team already   Diabetic Exams:  Diabetic Eye Exam: Overdue for diabetic eye exam. Pt has been advised about the importance in completing this exam. Patient will schedule with Dr.Bell  Diabetic Foot Exam: Completed 08/11/2018.    Interpreter Needed?: No  Information entered by :: Tiffany Hill,LPN  Past Medical History:  Diagnosis Date  . Anemia   . Arthritis   . Chronic kidney disease   . Colon  polyps   . Diabetes mellitus without complication ([place])   . Diabetic neuropathy ([place])   . Dyspnea   . GERD (gastroesophageal reflux disease)   . Hyperlipemia   . Hypertension   . Osteoarthritis   . Radiculopathy    weakness in right leg  . Sleep apnea    does not have her cpap anymore  . Stress incontinence   . Urticaria   . Vitamin B 12 deficiency   . Wears glasses    Past Surgical History:  Procedure Laterality Date  . ABDOMINAL HYSTERECTOMY    . APPENDECTOMY    . BREAST BIOPSY Right    right-neg  .  BREAST BIOPSY Left    us/bx/clip-neg  . CARPAL TUNNEL RELEASE     right  . COLONOSCOPY    . COLONOSCOPY WITH PROPOFOL N/A 12/10/2015   Procedure: COLONOSCOPY WITH PROPOFOL;  Surgeon: Lucilla Lame, MD;  Location: [place];  Service: Endoscopy;  Laterality: N/A;  Diabetic - oral meds Sleep Apnea LATEX allergy  . COLONOSCOPY WITH PROPOFOL N/A 01/17/2019   Procedure: COLONOSCOPY WITH PROPOFOL;  Surgeon: Virgel Manifold, MD;  Location: [place];  Service: Endoscopy;  Laterality: N/A;  . CYSTOSCOPY    . DILATION AND CURETTAGE OF UTERUS    . ESOPHAGOGASTRODUODENOSCOPY (EGD) WITH PROPOFOL N/A 01/17/2019   Procedure: ESOPHAGOGASTRODUODENOSCOPY (EGD) WITH PROPOFOL;  Surgeon: Virgel Manifold, MD;  Location: [place];  Service: Endoscopy;  Laterality: N/A;  . EYE SURGERY Bilateral    lens implant  . LUMBAR FUSION  2009  . POLYPECTOMY  12/10/2015   Procedure: POLYPECTOMY;  Surgeon: Lucilla Lame, MD;  Location: [facility];  Service: Endoscopy;;  . POLYPECTOMY  01/17/2019   Procedure: POLYPECTOMY;  Surgeon: Virgel Manifold, MD;  Location: [place];  Service: Endoscopy;;  . RESECTION DISTAL CLAVICAL Right 11/25/2013   Procedure: RIGHT SHOULDER OPEN RESECTION DISTAL CLAVICAL EXCISION SOFT TISSUE TUMOR SHOULDER DEEP SUBFASCIAL INTRAMUSCULAR/DEBRIDEMENT/ROTATOR CUFF REPAIR/BICEPS TENODESIS/MANIPULATION;  Surgeon: Renette Butters, MD;  Location: [place];  Service: Orthopedics;  Laterality: Right;  . SHOULDER ARTHROSCOPY WITH ROTATOR CUFF REPAIR AND SUBACROMIAL DECOMPRESSION Right 11/25/2013   Procedure: SHOULDER ARTHROSCOPY WITH DEBRIDEMENT ROTATOR CUFF REPAIR  A;  Surgeon: Renette Butters, MD;  Location: [place];  Service: Orthopedics;  Laterality: Right;   Family History  Problem Relation Age of Onset  . Heart disease Mother   . Diabetes Mother   . Hypertension Mother   . Heart disease Father   .  Hypertension Father   . Diabetes Sister   . Hypertension Sister   . Heart disease Sister   . Kidney disease Sister   . Diabetes Sister   . Hypertension Sister   . Breast cancer Cousin 29  . Non-Hodgkin's lymphoma Maternal Uncle    Social History   Socioeconomic History  . Marital status: Single    Spouse name: Not on file  . Number of children: Not on file  . Years of education: Not on file  . Highest education level: Some college, no degree  Occupational History  . Occupation: retired  Scientific laboratory technician  . Financial resource strain: Not hard at all  . Food insecurity    Worry: Never true    Inability: Never true  . Transportation needs    Medical: No    Non-medical: No  Tobacco Use  . Smoking status: Former Smoker    Packs/day: 1.00    Years: 15.00  Pack years: 15.00    Quit date: 11/22/1997    Years since quitting: 21.3  . Smokeless tobacco: Never Used  Substance and Sexual Activity  . Alcohol use: No  . Drug use: No  . Sexual activity: Not Currently  Lifestyle  . Physical activity    Days per week: 0 days    Minutes per session: 0 min  . Stress: Not at all  Relationships  . Social connections    Talks on phone: More than three times a week    Gets together: More than three times a week    Attends religious service: More than 4 times per year    Active member of club or organization: No    Attends meetings of clubs or organizations: Never    Relationship status: Not on file  Other Topics Concern  . Not on file  Social History Narrative  . Not on file    Outpatient Encounter Medications as of 03/14/2019  Medication Sig  . acetaminophen (TYLENOL) 325 MG tablet Take 650 mg by mouth every 6 (six) hours as needed.  [name] atorvastatin (LIPITOR) 40 MG tablet Take 1 tablet (40 mg total) by mouth daily.  . blood glucose meter kit and supplies KIT Dispense based on patient and insurance preference. Use up to four times daily as directed. (FOR ICD-9 250.00, 250.01).  .  cloNIDine (CATAPRES) 0.1 MG tablet Take 0.1 mg by mouth 2 (two) times daily.  . diclofenac sodium (VOLTAREN) 1 % GEL Apply 2 g topically 2 (two) times daily as needed.  . ferrous sulfate 325 (65 FE) MG EC tablet Take 325 mg by mouth every other day.   . gabapentin (NEURONTIN) 100 MG capsule Take 3 capsules (300 mg total) by mouth at bedtime. (Patient taking differently: Take 200 mg by mouth at bedtime. )  . glucose blood test strip Check daily. ICD 10 E11.9  . hydrochlorothiazide (HYDRODIURIL) 25 MG tablet Take 1 tablet by mouth once daily  . metFORMIN (GLUCOPHAGE-XR) 500 MG 24 hr tablet Take 2 tablets (1,000 mg total) by mouth 2 (two) times daily.  . metoprolol succinate (TOPROL-XL) 50 MG 24 hr tablet Take 1 tablet by mouth once daily  . omeprazole (PRILOSEC) 20 MG capsule Take 1 capsule by mouth once daily   Facility-Administered Encounter Medications as of 03/14/2019  Medication  . cyanocobalamin ((VITAMIN B-12)) injection 1,000 mcg    Activities of Daily Living In your present state of health, do you have any difficulty performing the following activities: 03/14/2019 01/17/2019  Hearing? N N  Comment no hearing aids -  Vision? Y N  Comment eyeglasses, sees dr.bell -  Difficulty concentrating or making decisions? N N  Walking or climbing stairs? Y N  Dressing or bathing? N N  Doing errands, shopping? N -  Preparing Food and eating ? N -  Using the Toilet? N -  In the past six months, have you accidently leaked urine? Y -  Comment wears pads -  Do you have problems with loss of bowel control? N -  Managing your Medications? N -  Managing your Finances? N -  Housekeeping or managing your Housekeeping? N -  Some recent data might be hidden    Patient Care Team: [name], NP as PCP - General (Nurse Practitioner) Yolonda Kida, MD (Internal Medicine) De Hollingshead, [facility] as Pharmacist (Pharmacist) Minor, Dalbert Garnet, RN as Case Manager    Assessment:   This is a  routine wellness examination  for [employer].  Exercise Activities and Dietary recommendations Current Exercise Habits: The patient does not participate in regular exercise at present, Exercise limited by: None identified  Goals    . "I want to take care of my medications" (pt-stated)     Current Barriers:  . Polypharmacy; patient on multiple medications for chronic disease state management  o Reports hx diarrhea with iron supplementation; Marnee Guarneri, NP recommended Slow Fe formulation; patient was unable to find Slow Fe 65 mg, so has continued ferrous sulfate 65 mg; reports diarrhea every morning.  o A1c very well controlled today at 6.1%.   Pharmacist Clinical Goal(s):  [name] Over the next 60 days, patient will work with PharmD and primary care provider to address needs related to optimized medication management of chronic conditions  Interventions: . Reviewed that Slow Fe does not come as 65 mg, just 45 mg; however, given significant GI symptoms, recommend trying this dose of Slow Fe ferrous sulfate. Patient verbalized understanding. Recommended she contact [place] to see if she has OTC benefits; she may be able to purchase Slow Fe through these benefits.  . May be able to receive BP cuff through OTC benefits as well. Recommended she pursue this . Metformin being decreased to 500 mg BID d/t well controlled A1c. Will continue to follow BG  Patient Self Care Activities:  . Self administers medications as prescribed . Calls provider office for new concerns or questions  Please see past updates related to this goal by clicking on the "Past Updates" button in the selected goal      . "I'm dizzy and groggy in the mornings" (pt-stated)     Current Barriers:  . Upon review of medication review, patient confirmed that she often feels dizzy, sedated, and groggy in the mornings, and this seems to have happened after initiation of gabapentin therapy. She asks if she could try less gabapentin.  .  Clonidine may also be contributing to CNS sedation; followed by [facility] cardiology Dr. Clayborn Bigness . Patient diagnosed with sleep apnea; has undergone testing, but unable to afford CPAP supplies at this time  Pharmacist Clinical Goal(s):  [name] Over the next 30 days, patient will work with CCM team to address needs related to reduction in dizziness, risk of falls  Interventions: . Comprehensive medication review performed.  . Agreed that patient could try gabapentin 200 mg (2 capsules) QHS for a week to see if pain control is maintained with less next-morning side effects. Patient verbalized understanding.  . Will collaborate with CCM CM RN Janci Minor to determine if any help for CPAP supplies. Untreated OSA could contribute to next-morning drowsiness  Patient Self Care Activities:  . Self administers medications as prescribed . Calls provider office for new concerns or questions  Initial goal documentation     . I am having diarhea every morning (pt-stated)     Current Barriers:  [name] Knowledge Deficits related to why she is having diarrhea consistently in the mornings. . Film/video editor.  . Patient expressed concern over weight loss(12lbs in 1 year)   Nurse Case Manager Clinical Goal(s):  [name] Over the next 90 days, patient will work with [facility]  to address needs related to diarrhea   Interventions:  . Reviewed medications with patient and discussed potential medication causes.Patient reports symptom started at the same time she started iron. Nash Dimmer with PCP and CCM pharmacist regarding potential causes, PCP reduced Metformin dose related to patient's A1C 6 today. Pharmacist and PCP discussed with patient  trying Slow Fe to see if this made a difference. Also discussed patient's OTC benefit with her [facility] medicare.  . Discussed plans with patient for ongoing care management follow up and provided patient with direct contact information for care management team . Informed patient  her weight loss was 12lbs over the last year.  . Plan to f/u to see if medication changes improved symptoms.   Patient Self Care Activities:  . Currently UNABLE TO independently control diarhea   Initial goal documentation     . Increase water intake     Recommend drinking at least 6-7 glasses of water a day        Fall Risk: Fall Risk  03/04/2018 12/08/2017 02/05/2017 11/24/2016 11/20/2015  Falls in the past year? No No Yes No No  Number falls in past yr: - - 1 - -  Injury with Fall? - - No - -  Follow up - - Falls prevention discussed - -    FALL RISK PREVENTION PERTAINING TO THE HOME:  Any stairs in or around the home? No , 3 steps with handrails  If so, are there any without handrails? No   Home free of loose throw rugs in walkways, pet beds, electrical cords, etc? Yes  Adequate lighting in your home to reduce risk of falls? Yes   ASSISTIVE DEVICES UTILIZED TO PREVENT FALLS:  Life alert? No  Use of a cane, walker or w/c? No  Grab bars in the bathroom? No  Shower chair or bench in shower? No  Elevated toilet seat or a handicapped toilet? Yes   TIMED UP AND GO:  Unable to perform    Depression Screen PHQ 2/9 Scores 03/04/2018 12/08/2017 02/05/2017 11/24/2016  PHQ - 2 Score 0 0 0 0  PHQ- 9 Score - - - 4     Cognitive Function     6CIT Screen 03/04/2018 02/05/2017  What Year? 0 points 0 points  What month? 0 points 0 points  What time? 0 points 0 points  Count back from 20 0 points 0 points  Months in reverse 0 points 0 points  Repeat phrase 0 points 0 points  Total Score 0 0    Immunization History  Administered Date(s) Administered  . Pneumococcal Conjugate-13 10/12/2013  . Pneumococcal Polysaccharide-23 05/28/2010  . Td 10/02/2006    Qualifies for Shingles Vaccine? Yes  Zostavax completed n/a. Due for Shingrix. Education has been provided regarding the importance of this vaccine. Pt has been advised to call insurance company to determine out of pocket  expense. Advised may also receive vaccine at local pharmacy or Health Dept. Verbalized acceptance and understanding.  Tdap: Discussed need for TD/TDAP vaccine, patient verbalized understanding that this is not covered as a preventative with there insurance and to call the office if she develops any new skin injuries, ie: cuts, scrapes, bug bites, or open wounds.  Flu Vaccine: Due for Flu vaccine. declines  Pneumococcal Vaccine: up to date     Screening Tests Health Maintenance  Topic Date Due  . OPHTHALMOLOGY EXAM  09/03/2016  . INFLUENZA VACCINE  09/21/2019 (Originally 01/22/2019)  . TETANUS/TDAP  03/13/2020 (Originally 10/01/2016)  . FOOT EXAM  08/12/2019  . HEMOGLOBIN A1C  08/12/2019  . DEXA SCAN  Completed  . PNA vac Low Risk Adult  Completed    Cancer Screenings:  Colorectal Screening: Completed 01/17/2019. Repeat every 3 years.   Mammogram: Completed 2017, patient states she completed this year a well.  Bone Density: Completed 02/20/2016  Lung Cancer Screening: (Low Dose CT Chest recommended if Age 110-80 years, 30 pack-year currently smoking OR have quit w/in 15years.) does not qualify.   Additional Screening:  Hepatitis C Screening: n/a  Dental Screening: Recommended annual dental exams for proper oral hygiene   Community Resource Referral:  CRR required this visit?  No       Plan:  I have personally reviewed and addressed the Medicare Annual Wellness questionnaire and have noted the following in the patient's chart:  A. Medical and social history B. Use of alcohol, tobacco or illicit drugs  C. Current medications and supplements D. Functional ability and status E.  Nutritional status F.  Physical activity G. Advance directives H. List of other physicians I.  Hospitalizations, surgeries, and ER visits in previous 12 months J.  [place] such as hearing and vision if needed, cognitive and depression L. Referrals and appointments   In addition, I  have reviewed and discussed with patient certain preventive protocols, quality metrics, and best practice recommendations. A written personalized care plan for preventive services as well as general preventive health recommendations were provided to patient.   Signed,    Bevelyn Ngo, LPN  5/67/0141 Nurse Health Advisor   Nurse Notes: none

## 2019-03-15 ENCOUNTER — Ambulatory Visit: Payer: Self-pay | Admitting: *Deleted

## 2019-03-15 ENCOUNTER — Ambulatory Visit (INDEPENDENT_AMBULATORY_CARE_PROVIDER_SITE_OTHER): Payer: Medicare Other | Admitting: Pharmacist

## 2019-03-15 DIAGNOSIS — E1169 Type 2 diabetes mellitus with other specified complication: Secondary | ICD-10-CM | POA: Diagnosis not present

## 2019-03-15 DIAGNOSIS — D508 Other iron deficiency anemias: Secondary | ICD-10-CM

## 2019-03-15 DIAGNOSIS — E785 Hyperlipidemia, unspecified: Secondary | ICD-10-CM | POA: Diagnosis not present

## 2019-03-15 NOTE — Chronic Care Management (AMB) (Signed)
Chronic Care Management   Follow Up Note   03/15/2019 Name: Tina Mcdonald MRN: [id number] DOB: 12-19-1940  Referred by: [name], NP Reason for referral : Chronic Care Management (weight loss)   Tina Mcdonald is a 78 y.o. year old female who is a primary care patient of [name], [name], NP. The CCM team was consulted for assistance with chronic disease management and care coordination needs.    Review of patient status, including review of consultants reports, relevant laboratory and other test results, and collaboration with appropriate care team members and the patient's provider was performed as part of comprehensive patient evaluation and provision of chronic care management services.    SDOH (Social Determinants of Health) screening performed today: None. See Care Plan for related entries.   Advanced Directives Status: N See Care Plan and [place] application for related entries.  Outpatient Encounter Medications as of 03/15/2019  Medication Sig  . acetaminophen (TYLENOL) 325 MG tablet Take 650 mg by mouth every 6 (six) hours as needed.  Tina Mcdonald atorvastatin (LIPITOR) 40 MG tablet Take 1 tablet (40 mg total) by mouth daily.  . blood glucose meter kit and supplies KIT Dispense based on patient and insurance preference. Use up to four times daily as directed. (FOR ICD-9 250.00, 250.01).  . cloNIDine (CATAPRES) 0.1 MG tablet Take 0.1 mg by mouth 2 (two) times daily.  . diclofenac sodium (VOLTAREN) 1 % GEL Apply 2 g topically 2 (two) times daily as needed.  . ferrous sulfate 325 (65 FE) MG EC tablet Take 325 mg by mouth every other day.   . gabapentin (NEURONTIN) 100 MG capsule TAKE 3 CAPSULES BY MOUTH AT BEDTIME  . glucose blood test strip Check daily. ICD 10 E11.9  . hydrochlorothiazide (HYDRODIURIL) 25 MG tablet Take 1 tablet by mouth once daily  . metFORMIN (GLUCOPHAGE-XR) 500 MG 24 hr tablet Take 2 tablets (1,000 mg total) by mouth 2 (two) times daily.  . metoprolol  succinate (TOPROL-XL) 50 MG 24 hr tablet Take 1 tablet by mouth once daily  . omeprazole (PRILOSEC) 20 MG capsule Take 1 capsule by mouth once daily   Facility-Administered Encounter Medications as of 03/15/2019  Medication  . cyanocobalamin ((VITAMIN B-12)) injection 1,000 mcg     Goals Addressed            This Visit's Progress   . RN-I am having diarhea every morning (pt-stated)       Current Barriers:  Tina Mcdonald Knowledge Deficits related to why she is having diarrhea consistently in the mornings. . 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 Discussed plans with patient for ongoing care management follow up and provided patient with direct contact information for care management team . Patient stating she has had a decrease in episodes of diarrhea since starting the slow FE.  . Will plan to see patient at upcoming PCP visit 9/29 @ 10:15.  . Reviewed upcoming appointments: Cardiology and PCP on 9/29  Patient Self Care Activities:  . Currently UNABLE TO independently control diarhea   Please see past updates related to this goal by clicking on the "Past Updates" button in the selected goal          The care management team will reach out to the patient again over  the next 30 days.  The patient has been provided with contact information for the care management team and has been advised to call with any health related questions or concerns.   Tina Mcdonald Tina Larose RN, BSN Nurse Case Editor, [facility]  [phone]) Business Mobile

## 2019-03-15 NOTE — Patient Instructions (Signed)
Visit Information  Goals Addressed            This Visit's Progress     Patient Stated   . PharmD "I want to take care of my medications" (pt-stated)       Current Barriers:  . Polypharmacy; patient on multiple medications for chronic disease state management  o Reports improvement in diarrhea since our last phone call; picked up Slo Fe from [place], but wonders if there are generic versions that are more cost effective o Notes that she is having a hard time getting blood from her finger when she pricks it; A1c at goal <7%, currently treated with metformin 1000 mg BID. Checking QAM  o She has questions about indicated vaccines. She notes that a NP from [employer] came to her house and talked about influenza, Tdap, and Shingrix   Pharmacist Clinical Goal(s):  [name] Over the next 60 days, patient will work with PharmD and primary care provider to address needs related to optimized medication management of chronic conditions  Interventions: . Appears a generic slow release ferrous sulfate is available at [employer] per their website. Informed patient of this, she noted that she would send her son to look for this . Discussed warming hands before pricking, using a higher gauge prick and pricking on side of her finger, using a new lancet each time, "milking" her fingers/hand to some degree. However, d/t controlled T2DM and metformin monotherapy, patient may only need to be checking ~2-3 times per week, or less. Will discuss w/ Marnee Guarneri, NP at appointment next week.  . Discussed recommended vaccines. Patient is wary of vaccinations. Discussed recommendation for yearly influenza, Tdap, and Shingrix series. Patient noted she would think about this.   Patient Self Care Activities:  . Self administers medications as prescribed . Calls provider office for new concerns or questions  Please see past updates related to this goal by clicking on the "Past Updates" button in the selected goal          The patient verbalized understanding of instructions provided today and declined a print copy of patient instruction materials.   Plan: - CCM team will outreach patient in the next 5-6 weeks for continued medication management support  Catie Darnelle Maffucci, PharmD Clinical Pharmacist [place] [phone]

## 2019-03-15 NOTE — Chronic Care Management (AMB) (Signed)
Chronic Care Management   Follow Up Note   03/15/2019 Name: Tina Mcdonald MRN: [id number] DOB: 07/28/1940  Referred by: [name], NP Reason for referral : Chronic Care Management (Medication Management)   Tina Mcdonald is a 78 y.o. year old female who is a primary care patient of [name], [name], NP. The CCM team was consulted for assistance with chronic disease management and care coordination needs.    Contacted patient telephonically with Janci Minor, RN.   Review of patient status, including review of consultants reports, relevant laboratory and other test results, and collaboration with appropriate care team members and the patient's provider was performed as part of comprehensive patient evaluation and provision of chronic care management services.    SDOH (Social Determinants of Health) screening performed today: None. See Care Plan for related entries.   Advanced Directives Status: N See Care Plan and Vynca application for related entries.  Outpatient Encounter Medications as of 03/15/2019  Medication Sig  . acetaminophen (TYLENOL) 325 MG tablet Take 650 mg by mouth every 6 (six) hours as needed.  [name] atorvastatin (LIPITOR) 40 MG tablet Take 1 tablet (40 mg total) by mouth daily.  . blood glucose meter kit and supplies KIT Dispense based on patient and insurance preference. Use up to four times daily as directed. (FOR ICD-9 250.00, 250.01).  . cloNIDine (CATAPRES) 0.1 MG tablet Take 0.1 mg by mouth 2 (two) times daily.  . diclofenac sodium (VOLTAREN) 1 % GEL Apply 2 g topically 2 (two) times daily as needed.  . ferrous sulfate 325 (65 FE) MG EC tablet Take 325 mg by mouth every other day.   . gabapentin (NEURONTIN) 100 MG capsule TAKE 3 CAPSULES BY MOUTH AT BEDTIME  . glucose blood test strip Check daily. ICD 10 E11.9  . hydrochlorothiazide (HYDRODIURIL) 25 MG tablet Take 1 tablet by mouth once daily  . metFORMIN (GLUCOPHAGE-XR) 500 MG 24 hr tablet Take 2 tablets  (1,000 mg total) by mouth 2 (two) times daily.  . metoprolol succinate (TOPROL-XL) 50 MG 24 hr tablet Take 1 tablet by mouth once daily  . omeprazole (PRILOSEC) 20 MG capsule Take 1 capsule by mouth once daily   Facility-Administered Encounter Medications as of 03/15/2019  Medication  . cyanocobalamin ((VITAMIN B-12)) injection 1,000 mcg     Goals Addressed            This Visit's Progress     Patient Stated   . PharmD "I want to take care of my medications" (pt-stated)       Current Barriers:  . Polypharmacy; patient on multiple medications for chronic disease state management  o Reports improvement in diarrhea since our last phone call; picked up [place] from [place], but wonders if there are generic versions that are more cost effective o Notes that she is having a hard time getting blood from her finger when she pricks it; A1c at goal <7%, currently treated with metformin 1000 mg BID. Checking QAM  o She has questions about indicated vaccines. She notes that a NP from [employer] came to her house and talked about influenza, Tdap, and Shingrix   Pharmacist Clinical Goal(s):  [name] Over the next 60 days, patient will work with PharmD and primary care provider to address needs related to optimized medication management of chronic conditions  Interventions: . Appears a generic slow release ferrous sulfate is available at [employer] per their website. Informed patient of this, she noted that she would send her  son to look for this . Discussed warming hands before pricking, using a higher gauge prick and pricking on side of her finger, using a new lancet each time, "milking" her fingers/hand to some degree. However, d/t controlled T2DM and metformin monotherapy, patient may only need to be checking ~2-3 times per week, or less. Will discuss w/ Marnee Guarneri, NP at appointment next week.  . Discussed recommended vaccines. Patient is wary of vaccinations. Discussed recommendation for yearly  influenza, Tdap, and Shingrix series. Patient noted she would think about this.   Patient Self Care Activities:  . Self administers medications as prescribed . Calls provider office for new concerns or questions  Please see past updates related to this goal by clicking on the "Past Updates" button in the selected goal          Plan: - CCM team will outreach patient in the next 5-6 weeks for continued medication management support  Catie Darnelle Maffucci, PharmD Clinical Pharmacist [place] [phone]

## 2019-03-15 NOTE — Patient Instructions (Signed)
Thank you allowing the Chronic Care Management Team to be a part of your care! It was a pleasure speaking with you today!   CCM (Chronic Care Management) Team   Kalilah Barua RN, BSN Nurse Care Coordinator  [phone]  Catie [facility] PharmD  Clinical Pharmacist  [phone]  Eula Fried LCSW Clinical Social Worker [phone]  Goals Addressed            This Visit's Progress   . RN-I am having diarhea every morning (pt-stated)       Current Barriers:  [name] Knowledge Deficits related to why she is having diarrhea consistently in the mornings. . Film/video editor.  . Patient expressed concern over weight loss(12lbs in 1 year)   Nurse Case Manager Clinical Goal(s):  [name] Over the next 90 days, patient will work with [facility]  to address needs related to diarrhea   Interventions:  . Discussed plans with patient for ongoing care management follow up and provided patient with direct contact information for care management team . Patient stating she has had a decrease in episodes of diarrhea since starting the slow FE.  . Will plan to see patient at upcoming PCP visit 9/29 @ 10:15.  . Reviewed upcoming appointments: Cardiology and PCP on 9/29  Patient Self Care Activities:  . Currently UNABLE TO independently control diarhea   Please see past updates related to this goal by clicking on the "Past Updates" button in the selected goal         The patient verbalized understanding of instructions provided today and declined a print copy of patient instruction materials.   The patient has been provided with contact information for the care management team and has been advised to call with any health related questions or concerns.

## 2019-03-22 ENCOUNTER — Ambulatory Visit: Payer: Medicare Other | Admitting: Nurse Practitioner

## 2019-03-22 DIAGNOSIS — R200 Anesthesia of skin: Secondary | ICD-10-CM | POA: Diagnosis not present

## 2019-03-22 DIAGNOSIS — R0602 Shortness of breath: Secondary | ICD-10-CM | POA: Diagnosis not present

## 2019-03-22 DIAGNOSIS — I209 Angina pectoris, unspecified: Secondary | ICD-10-CM | POA: Diagnosis not present

## 2019-03-22 DIAGNOSIS — R05 Cough: Secondary | ICD-10-CM | POA: Diagnosis not present

## 2019-03-22 DIAGNOSIS — I10 Essential (primary) hypertension: Secondary | ICD-10-CM | POA: Diagnosis not present

## 2019-03-30 ENCOUNTER — Other Ambulatory Visit: Payer: Self-pay

## 2019-03-30 ENCOUNTER — Ambulatory Visit (INDEPENDENT_AMBULATORY_CARE_PROVIDER_SITE_OTHER): Payer: Medicare Other | Admitting: Nurse Practitioner

## 2019-03-30 ENCOUNTER — Encounter: Payer: Self-pay | Admitting: Nurse Practitioner

## 2019-03-30 VITALS — BP 118/71 | HR 78 | Temp 98.5°F | Ht 62.0 in | Wt 169.0 lb

## 2019-03-30 DIAGNOSIS — IMO0002 Reserved for concepts with insufficient information to code with codable children: Secondary | ICD-10-CM

## 2019-03-30 DIAGNOSIS — E1165 Type 2 diabetes mellitus with hyperglycemia: Secondary | ICD-10-CM

## 2019-03-30 DIAGNOSIS — E538 Deficiency of other specified B group vitamins: Secondary | ICD-10-CM | POA: Diagnosis not present

## 2019-03-30 DIAGNOSIS — E1122 Type 2 diabetes mellitus with diabetic chronic kidney disease: Secondary | ICD-10-CM

## 2019-03-30 MED ORDER — CYANOCOBALAMIN 1000 MCG/ML IJ SOLN
1000.0000 ug | Freq: Once | INTRAMUSCULAR | Status: AC
  Administered 2019-03-30: 1000 ug via INTRAMUSCULAR

## 2019-03-30 MED ORDER — METFORMIN HCL ER 500 MG PO TB24: 500.0000 mg | ORAL_TABLET | Freq: Two times a day (BID) | ORAL | 2 refills | Status: DC

## 2019-03-30 NOTE — Assessment & Plan Note (Signed)
Chronic, ongoing with A1C recent 6.1%.  BS continue to be well-controlled with reduction in Metformin.  Will continue current medication regimen and adjust as needed.  Plan on return to office in 6 weeks for A1C.

## 2019-03-30 NOTE — Patient Instructions (Signed)
Carbohydrate Counting for Diabetes Mellitus, Adult  Carbohydrate counting is a method of keeping track of how many carbohydrates you eat. Eating carbohydrates naturally increases the amount of sugar (glucose) in the blood. Counting how many carbohydrates you eat helps keep your blood glucose within normal limits, which helps you manage your diabetes (diabetes mellitus). It is important to know how many carbohydrates you can safely have in each meal. This is different for every person. A diet and nutrition specialist (registered dietitian) can help you make a meal plan and calculate how many carbohydrates you should have at each meal and snack. Carbohydrates are found in the following foods:  Grains, such as breads and cereals.  Dried beans and soy products.  Starchy vegetables, such as potatoes, peas, and corn.  Fruit and fruit juices.  Milk and yogurt.  Sweets and snack foods, such as cake, cookies, candy, chips, and soft drinks. How do I count carbohydrates? There are two ways to count carbohydrates in food. You can use either of the methods or a combination of both. Reading "Nutrition Facts" on packaged food The "Nutrition Facts" list is included on the labels of almost all packaged foods and beverages in the [place] It includes:  The serving size.  Information about nutrients in each serving, including the grams (g) of carbohydrate per serving. To use the "Nutrition Facts":  Decide how many servings you will have.  Multiply the number of servings by the number of carbohydrates per serving.  The resulting number is the total amount of carbohydrates that you will be having. Learning standard serving sizes of other foods When you eat carbohydrate foods that are not packaged or do not include "Nutrition Facts" on the label, you need to measure the servings in order to count the amount of carbohydrates:  Measure the foods that you will eat with a food scale or measuring cup, if needed.   Decide how many standard-size servings you will eat.  Multiply the number of servings by 15. Most carbohydrate-rich foods have about 15 g of carbohydrates per serving. ? For example, if you eat 8 oz (170 g) of strawberries, you will have eaten 2 servings and 30 g of carbohydrates (2 servings x 15 g = 30 g).  For foods that have more than one food mixed, such as soups and casseroles, you must count the carbohydrates in each food that is included. The following list contains standard serving sizes of common carbohydrate-rich foods. Each of these servings has about 15 g of carbohydrates:   hamburger bun or  English muffin.   oz (15 mL) syrup.   oz (14 g) jelly.  1 slice of bread.  1 six-inch tortilla.  3 oz (85 g) cooked rice or pasta.  4 oz (113 g) cooked dried beans.  4 oz (113 g) starchy vegetable, such as peas, corn, or potatoes.  4 oz (113 g) hot cereal.  4 oz (113 g) mashed potatoes or  of a large baked potato.  4 oz (113 g) canned or frozen fruit.  4 oz (120 mL) fruit juice.  4-6 crackers.  6 chicken nuggets.  6 oz (170 g) unsweetened dry cereal.  6 oz (170 g) plain fat-free yogurt or yogurt sweetened with artificial sweeteners.  8 oz (240 mL) milk.  8 oz (170 g) fresh fruit or one small piece of fruit.  24 oz (680 g) popped popcorn. Example of carbohydrate counting Sample meal  3 oz (85 g) chicken breast.  6 oz (170 g)   brown rice.  4 oz (113 g) corn.  8 oz (240 mL) milk.  8 oz (170 g) strawberries with sugar-free whipped topping. Carbohydrate calculation 1. Identify the foods that contain carbohydrates: ? Rice. ? Corn. ? Milk. ? Strawberries. 2. Calculate how many servings you have of each food: ? 2 servings rice. ? 1 serving corn. ? 1 serving milk. ? 1 serving strawberries. 3. Multiply each number of servings by 15 g: ? 2 servings rice x 15 g = 30 g. ? 1 serving corn x 15 g = 15 g. ? 1 serving milk x 15 g = 15 g. ? 1 serving  strawberries x 15 g = 15 g. 4. Add together all of the amounts to find the total grams of carbohydrates eaten: ? 30 g + 15 g + 15 g + 15 g = 75 g of carbohydrates total. Summary  Carbohydrate counting is a method of keeping track of how many carbohydrates you eat.  Eating carbohydrates naturally increases the amount of sugar (glucose) in the blood.  Counting how many carbohydrates you eat helps keep your blood glucose within normal limits, which helps you manage your diabetes.  A diet and nutrition specialist (registered dietitian) can help you make a meal plan and calculate how many carbohydrates you should have at each meal and snack. This information is not intended to replace advice given to you by your health care provider. Make sure you discuss any questions you have with your health care provider. Document Released: 06/09/2005 Document Revised: 01/01/2017 Document Reviewed: 11/21/2015 Elsevier Patient Education  2020 Elsevier Inc.  

## 2019-03-30 NOTE — Progress Notes (Signed)
BP 118/71   Pulse 78   Temp 98.5 F (36.9 C) (Oral)   Ht 5\' 2"  (1.575 m)   Wt 169 lb (76.7 kg)   LMP  (LMP Unknown)   SpO2 95%   BMI 30.91 kg/m    Subjective:    Patient ID: Tina Mcdonald, female    DOB: 03/22/1941, 78 y.o.   MRN: [id number]  HPI: Tina Mcdonald is a 78 y.o. female  Chief Complaint  Patient presents with  . Diabetes  . B12 Injection   DIABETES A1C August was 6.1%.  She was recommended to decrease Metformin to 500 MG twice daily and continue to monitor blood sugar.  Has underlying B12 deficiency and receives injections with last level increased from 298 to >2000.  Has occasionally taken extra Metformin during day if blood sugar greater than 130.   Hypoglycemic episodes:no Polydipsia/polyuria: no Visual disturbance: no Chest pain: no Paresthesias: no Glucose Monitoring: yes  Accucheck frequency: Daily  Fasting glucose: 90 to 120, had one episode of BS 136  Post prandial:  Evening:  Before meals: Taking Insulin?: no  Long acting insulin:  Short acting insulin: Blood Pressure Monitoring: not checking Retinal Examination: Not up to Date, is scheduled for Monday Foot Exam: Up to Date Pneumovax: Up to Date Influenza: refused Aspirin: no  Relevant past medical, surgical, family and social history reviewed and updated as indicated. Interim medical history since our last visit reviewed. Allergies and medications reviewed and updated.  Review of Systems  Constitutional: Negative for activity change, appetite change, diaphoresis, fatigue and fever.  Respiratory: Negative for cough, chest tightness and shortness of breath.   Cardiovascular: Negative for chest pain, palpitations and leg swelling.  Gastrointestinal: Negative for abdominal distention, abdominal pain, constipation, diarrhea, nausea and vomiting.  Endocrine: Negative for cold intolerance, heat intolerance, polydipsia, polyphagia and polyuria.  Neurological: Negative for dizziness, syncope,  weakness, light-headedness, numbness and headaches.  Psychiatric/Behavioral: Negative.     Per HPI unless specifically indicated above     Objective:    BP 118/71   Pulse 78   Temp 98.5 F (36.9 C) (Oral)   Ht 5\' 2"  (1.575 m)   Wt 169 lb (76.7 kg)   LMP  (LMP Unknown)   SpO2 95%   BMI 30.91 kg/m   Wt Readings from Last 3 Encounters:  03/30/19 169 lb (76.7 kg)  02/09/19 168 lb (76.2 kg)  01/17/19 163 lb (73.9 kg)    Physical Exam Vitals signs and nursing note reviewed.  Constitutional:      General: She is awake. She is not in acute distress.    Appearance: She is well-developed and overweight. She is not ill-appearing.  HENT:     Head: Normocephalic.     Right Ear: Hearing normal.     Left Ear: Hearing normal.  Eyes:     General: Lids are normal.        Right eye: No discharge.        Left eye: No discharge.     Conjunctiva/sclera: Conjunctivae normal.     Pupils: Pupils are equal, round, and reactive to light.  Neck:     Musculoskeletal: Normal range of motion and neck supple.     Vascular: No carotid bruit.  Cardiovascular:     Rate and Rhythm: Normal rate and regular rhythm.     Heart sounds: Normal heart sounds. No murmur. No gallop.   Pulmonary:     Effort: Pulmonary effort is normal. No accessory  muscle usage or respiratory distress.     Breath sounds: Normal breath sounds.  Abdominal:     General: Bowel sounds are normal.     Palpations: Abdomen is soft.  Musculoskeletal:     Right lower leg: No edema.     Left lower leg: No edema.  Skin:    General: Skin is warm and dry.  Neurological:     Mental Status: She is alert and oriented to person, place, and time.  Psychiatric:        Attention and Perception: Attention normal.        Mood and Affect: Mood normal.        Behavior: Behavior normal. Behavior is cooperative.        Thought Content: Thought content normal.        Judgment: Judgment normal.     Results for orders placed or performed in  visit on 02/09/19  Bayer DCA Hb A1c Waived  Result Value Ref Range   HB A1C (BAYER DCA - WAIVED) 6.1 <7.0 %  Comprehensive metabolic panel  Result Value Ref Range   Glucose 102 (H) 65 - 99 mg/dL   BUN 23 8 - 27 mg/dL   Creatinine, Ser 1.07 (H) 0.57 - 1.00 mg/dL   GFR calc non Af Amer 51 (L) >59 mL/min/1.73   GFR calc Af Amer 58 (L) >59 mL/min/1.73   BUN/Creatinine Ratio 21 12 - 28   Sodium 143 134 - 144 mmol/L   Potassium 4.2 3.5 - 5.2 mmol/L   Chloride 104 96 - 106 mmol/L   CO2 23 20 - 29 mmol/L   Calcium 9.5 8.7 - 10.3 mg/dL   Total Protein 6.7 6.0 - 8.5 g/dL   Albumin 4.2 3.7 - 4.7 g/dL   Globulin, Total 2.5 1.5 - 4.5 g/dL   Albumin/Globulin Ratio 1.7 1.2 - 2.2   Bilirubin Total 0.2 0.0 - 1.2 mg/dL   Alkaline Phosphatase 67 39 - 117 IU/L   AST 14 0 - 40 IU/L   ALT 15 0 - 32 IU/L  Lipid Panel w/o Chol/HDL Ratio  Result Value Ref Range   Cholesterol, Total 187 100 - 199 mg/dL   Triglycerides 104 0 - 149 mg/dL   HDL 49 >39 mg/dL   VLDL Cholesterol Cal 21 5 - 40 mg/dL   LDL Calculated 117 (H) 0 - 99 mg/dL      Assessment & Plan:   Problem List Items Addressed This Visit      Endocrine   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  BS continue to be well-controlled with reduction in Metformin.  Will continue current medication regimen and adjust as needed.  Plan on return to office in 6 weeks for A1C.      Relevant Medications   metFORMIN (GLUCOPHAGE-XR) 500 MG 24 hr tablet     Other   B12 deficiency - Primary    Continue supplement and obtain B12 level next visit.          Follow up plan: Return in about 6 weeks (around 05/11/2019) for T2DM, HTN/HLD.

## 2019-03-30 NOTE — Assessment & Plan Note (Signed)
Continue supplement and obtain B12 level next visit.

## 2019-04-11 ENCOUNTER — Ambulatory Visit: Payer: Self-pay | Admitting: Nurse Practitioner

## 2019-04-11 NOTE — Telephone Encounter (Signed)
Pt reports went to manicurist today and had nail polish removed. States noted "Nails seem thick underneath." Reports left hand, thumb, index and middle fingernails; right hand small and middle finger.  States left thumb "The worse, looks like nail is trying to separate and dark underneath nail" Denies any numbness, tingling, not cold, warm to touch. Denies redness. States left thumb may be a little swollen.Call transferred to practice, Christan,  for consideration of appt. CB# [phone] [id number]  Reason for Disposition . [1] Fingernail comes off or is almost off AND [2] follows old injury AND [3] wants doctor to remove it  Answer Assessment - Initial Assessment Questions 1. MECHANISM: "How did the injury happen?"      No injury 2. ONSET: "When did the injury happen?" (Minutes or hours ago)      *No Answer* 3. LOCATION: "What part of the finger is injured?" "Is the nail damaged?"      *No Answer* 4. APPEARANCE of the INJURY: "What does the injury look like?"      *No Answer* 5. SEVERITY: "Can you use the hand normally?"  "Can you bend your fingers into a ball and then fully open them?"     yes 6. SIZE: For cuts, bruises, or swelling, ask: "How large is it?" (e.g., inches or centimeters;  entire finger)      *No Answer* 7. PAIN: "Is there pain?" If so, ask: "How bad is the pain?"    (e.g., Scale 1-10; or mild, moderate, severe)    no 8. TETANUS: For any breaks in the skin, ask: "When was the last tetanus booster?"     *No Answer* 9. OTHER SYMPTOMS: "Do you have any other symptoms?"     *No Answer*  Protocols used: FINGER INJURY-A-AH

## 2019-04-12 ENCOUNTER — Ambulatory Visit: Payer: Medicare Other | Admitting: Gastroenterology

## 2019-04-12 ENCOUNTER — Encounter: Payer: Self-pay | Admitting: Nurse Practitioner

## 2019-04-12 ENCOUNTER — Other Ambulatory Visit: Payer: Self-pay

## 2019-04-12 ENCOUNTER — Ambulatory Visit (INDEPENDENT_AMBULATORY_CARE_PROVIDER_SITE_OTHER): Payer: Medicare Other | Admitting: Nurse Practitioner

## 2019-04-12 ENCOUNTER — Telehealth: Payer: Self-pay

## 2019-04-12 VITALS — BP 133/80 | HR 77 | Temp 99.0°F

## 2019-04-12 DIAGNOSIS — B351 Tinea unguium: Secondary | ICD-10-CM | POA: Diagnosis not present

## 2019-04-12 MED ORDER — CICLOPIROX 8 % EX SOLN: Freq: Every day | CUTANEOUS | 1 refills | Status: DC

## 2019-04-12 NOTE — Patient Instructions (Signed)
Fungal Nail Infection A fungal nail infection is a common infection of the toenails or fingernails. This condition affects toenails more often than fingernails. It often affects the great, or big, toes. More than one nail may be infected. The condition can be passed from person to person (is contagious). What are the causes? This condition is caused by a fungus. Several types of fungi can cause the infection. These fungi are common in moist and warm areas. If your hands or feet come into contact with the fungus, it may get into a crack in your fingernail or toenail and cause the infection. What increases the risk? The following factors may make you more likely to develop this condition:  Being female.  Being of older age.  Living with someone who has the fungus.  Walking barefoot in areas where the fungus thrives, such as showers or locker rooms.  Wearing shoes and socks that cause your feet to sweat.  Having a nail injury or a recent nail surgery.  Having certain medical conditions, such as: ? Athlete's foot. ? Diabetes. ? Psoriasis. ? Poor circulation. ? A weak body defense system (immune system). What are the signs or symptoms? Symptoms of this condition include:  A pale spot on the nail.  Thickening of the nail.  A nail that becomes yellow or brown.  A brittle or ragged nail edge.  A crumbling nail.  A nail that has lifted away from the nail bed. How is this diagnosed? This condition is diagnosed with a physical exam. Your health care provider may take a scraping or clipping from your nail to test for the fungus. How is this treated? Treatment is not needed for mild infections. If you have significant nail changes, treatment may include:  Antifungal medicines taken by mouth (orally). You may need to take the medicine for several weeks or several months, and you may not see the results for a long time. These medicines can cause side effects. Ask your health care provider  what problems to watch for.  Antifungal nail polish or nail cream. These may be used along with oral antifungal medicines.  Laser treatment of the nail.  Surgery to remove the nail. This may be needed for the most severe infections. It can take a long time, usually up to a year, for the infection to go away. The infection may also come back. Follow these instructions at home: Medicines  Take or apply over-the-counter and prescription medicines only as told by your health care provider.  Ask your health care provider about using over-the-counter mentholated ointment on your nails. Nail care  Trim your nails often.  Wash and dry your hands and feet every day.  Keep your feet dry: ? Wear absorbent socks, and change your socks frequently. ? Wear shoes that allow air to circulate, such as sandals or canvas tennis shoes. Throw out old shoes.  Do not use artificial nails.  If you go to a nail salon, make sure you choose one that uses clean instruments.  Use antifungal foot powder on your feet and in your shoes. General instructions  Do not share personal items, such as towels or nail clippers.  Do not walk barefoot in shower rooms or locker rooms.  Wear rubber gloves if you are working with your hands in wet areas.  Keep all follow-up visits as told by your health care provider. This is important. Contact a health care provider if: Your infection is not getting better or it is getting worse   after several months. Summary  A fungal nail infection is a common infection of the toenails or fingernails.  Treatment is not needed for mild infections. If you have significant nail changes, treatment may include taking medicine orally and applying medicine to your nails.  It can take a long time, usually up to a year, for the infection to go away. The infection may also come back.  Take or apply over-the-counter and prescription medicines only as told by your health care provider.   Follow instructions for taking care of your nails to help prevent infection from coming back or spreading. This information is not intended to replace advice given to you by your health care provider. Make sure you discuss any questions you have with your health care provider. Document Released: 06/06/2000 Document Revised: 09/30/2018 Document Reviewed: 11/13/2017 Elsevier Patient Education  2020 Elsevier Inc.  

## 2019-04-12 NOTE — Progress Notes (Signed)
BP 133/80   Pulse 77   Temp 99 F (37.2 C) (Oral)   LMP  (LMP Unknown)   SpO2 98%    Subjective:    Patient ID: Tina Mcdonald, female    DOB: 26-Dec-1940, 78 y.o.   MRN: [id number]  HPI: RAYNIAH BENDIXEN is a 78 y.o. female  Chief Complaint  Patient presents with  . Nail Problem    left hand, states she thinks her nails might be infected or trying to come off. States she noticed this about 2 weeks ago, gotten worse since    NAIL PROBLEM: Had nail polish taken off nails yesterday.  First noticed current issue 2 weeks ago with discoloration of finger nails and left thumb nail slightly "coming off".  Left hand worse than right.  Has recently started iron every day, which has improved anemia and can no correlate this to her nails.  Denies pain to nail beds and no drainage.  Has never had this issue before.  Does endorse getting nails done every 2-3 weeks at a local salon.  On further discussion she does report some discoloration being present years ago when she once got acrylic nails, which she no longer does.  Has not tried any treatments at home.  Relevant past medical, surgical, family and social history reviewed and updated as indicated. Interim medical history since our last visit reviewed. Allergies and medications reviewed and updated.  Review of Systems  Constitutional: Negative for activity change, appetite change, diaphoresis, fatigue and fever.  Respiratory: Negative for cough, chest tightness and shortness of breath.   Cardiovascular: Negative for chest pain, palpitations and leg swelling.  Gastrointestinal: Negative for abdominal distention, abdominal pain, constipation, diarrhea, nausea and vomiting.  Skin: Negative for rash and wound.       Nail changes  Neurological: Negative for weakness.    Per HPI unless specifically indicated above     Objective:    BP 133/80   Pulse 77   Temp 99 F (37.2 C) (Oral)   LMP  (LMP Unknown)   SpO2 98%   Wt Readings from  Last 3 Encounters:  03/30/19 169 lb (76.7 kg)  02/09/19 168 lb (76.2 kg)  01/17/19 163 lb (73.9 kg)    Physical Exam Vitals signs and nursing note reviewed.  Constitutional:      General: She is awake. She is not in acute distress.    Appearance: She is well-developed. She is not ill-appearing.  HENT:     Head: Normocephalic.     Right Ear: Hearing normal.     Left Ear: Hearing normal.  Eyes:     General: Lids are normal.        Right eye: No discharge.        Left eye: No discharge.     Conjunctiva/sclera: Conjunctivae normal.     Pupils: Pupils are equal, round, and reactive to light.  Neck:     Musculoskeletal: Normal range of motion and neck supple.     Vascular: No carotid bruit.  Cardiovascular:     Rate and Rhythm: Normal rate and regular rhythm.     Heart sounds: Normal heart sounds. No murmur. No gallop.   Pulmonary:     Effort: Pulmonary effort is normal. No accessory muscle usage or respiratory distress.     Breath sounds: Normal breath sounds.  Abdominal:     General: Bowel sounds are normal.     Palpations: Abdomen is soft.  Musculoskeletal:  Right lower leg: No edema.     Left lower leg: No edema.  Skin:    General: Skin is warm and dry.     Nails: There is no clubbing.      Comments: Nails bilateral hands with discoloration, yellowish, and thick nails.  Nail beds with dry skin.  L>R hand.  No drainage or open wounds.  Left thumb nail appears to be slightly loose from nail bed.  Neurological:     Mental Status: She is alert and oriented to person, place, and time.  Psychiatric:        Attention and Perception: Attention normal.        Mood and Affect: Mood normal.        Behavior: Behavior normal. Behavior is cooperative.        Thought Content: Thought content normal.        Judgment: Judgment normal.     Results for orders placed or performed in visit on 02/09/19  Bayer DCA Hb A1c Waived  Result Value Ref Range   HB A1C (BAYER DCA - WAIVED) 6.1  <7.0 %  Comprehensive metabolic panel  Result Value Ref Range   Glucose 102 (H) 65 - 99 mg/dL   BUN 23 8 - 27 mg/dL   Creatinine, Ser 1.07 (H) 0.57 - 1.00 mg/dL   GFR calc non Af Amer 51 (L) >59 mL/min/1.73   GFR calc Af Amer 58 (L) >59 mL/min/1.73   BUN/Creatinine Ratio 21 12 - 28   Sodium 143 134 - 144 mmol/L   Potassium 4.2 3.5 - 5.2 mmol/L   Chloride 104 96 - 106 mmol/L   CO2 23 20 - 29 mmol/L   Calcium 9.5 8.7 - 10.3 mg/dL   Total Protein 6.7 6.0 - 8.5 g/dL   Albumin 4.2 3.7 - 4.7 g/dL   Globulin, Total 2.5 1.5 - 4.5 g/dL   Albumin/Globulin Ratio 1.7 1.2 - 2.2   Bilirubin Total 0.2 0.0 - 1.2 mg/dL   Alkaline Phosphatase 67 39 - 117 IU/L   AST 14 0 - 40 IU/L   ALT 15 0 - 32 IU/L  Lipid Panel w/o Chol/HDL Ratio  Result Value Ref Range   Cholesterol, Total 187 100 - 199 mg/dL   Triglycerides 104 0 - 149 mg/dL   HDL 49 >39 mg/dL   VLDL Cholesterol Cal 21 5 - 40 mg/dL   LDL Calculated 117 (H) 0 - 99 mg/dL      Assessment & Plan:   Problem List Items Addressed This Visit      Musculoskeletal and Integument   Nail fungus - Primary    Bilateral hands with L > R.  Script for Penlac sent and educated patient on use of this.  Recommended not to go to nail salon until current issue resolved or while using Penlac.  She was able to verbalize plan back.  Return as scheduled or sooner if worsening.      Relevant Medications   ciclopirox (PENLAC) 8 % solution       Follow up plan: Return if symptoms worsen or fail to improve, for has follow-up in one month scheduled.

## 2019-04-12 NOTE — Assessment & Plan Note (Signed)
Bilateral hands with L > R.  Script for Penlac sent and educated patient on use of this.  Recommended not to go to nail salon until current issue resolved or while using Penlac.  She was able to verbalize plan back.  Return as scheduled or sooner if worsening.

## 2019-04-14 DIAGNOSIS — E113393 Type 2 diabetes mellitus with moderate nonproliferative diabetic retinopathy without macular edema, bilateral: Secondary | ICD-10-CM | POA: Diagnosis not present

## 2019-04-18 ENCOUNTER — Other Ambulatory Visit: Payer: Self-pay | Admitting: Nurse Practitioner

## 2019-04-19 ENCOUNTER — Encounter: Payer: Self-pay | Admitting: Emergency Medicine

## 2019-04-19 ENCOUNTER — Other Ambulatory Visit: Payer: Self-pay

## 2019-04-19 ENCOUNTER — Ambulatory Visit
Admission: EM | Admit: 2019-04-19 | Discharge: 2019-04-19 | Disposition: A | Discharge status: Home / Self Care | Payer: Medicare Other | Attending: Family Medicine | Admitting: Family Medicine

## 2019-04-19 ENCOUNTER — Ambulatory Visit (INDEPENDENT_AMBULATORY_CARE_PROVIDER_SITE_OTHER): Payer: Medicare Other | Admitting: Gastroenterology

## 2019-04-19 ENCOUNTER — Encounter: Payer: Self-pay | Admitting: Gastroenterology

## 2019-04-19 VITALS — BP 162/76 | HR 101 | Temp 98.5°F | Wt 167.5 lb

## 2019-04-19 DIAGNOSIS — R002 Palpitations: Secondary | ICD-10-CM | POA: Diagnosis not present

## 2019-04-19 DIAGNOSIS — D509 Iron deficiency anemia, unspecified: Secondary | ICD-10-CM

## 2019-04-19 DIAGNOSIS — K219 Gastro-esophageal reflux disease without esophagitis: Secondary | ICD-10-CM | POA: Diagnosis not present

## 2019-04-19 DIAGNOSIS — R0602 Shortness of breath: Secondary | ICD-10-CM | POA: Diagnosis not present

## 2019-04-19 NOTE — Discharge Instructions (Signed)
Everything looks good.  Follow up with Cardiology/pulmonology.  Take care  Dr. Lacinda Axon

## 2019-04-19 NOTE — ED Triage Notes (Addendum)
Patient states [facility] sent her here to be evaluated for elevated BP and HR. In their office her BP was 162/76 and her HR was 101. Patient does state she had some SOB this morning on her way to her doctors appointment. Denies chest pain.

## 2019-04-19 NOTE — Progress Notes (Signed)
Vonda Antigua, MD [address]  [place]  [place], [place] [address]  Main: [phone]  Fax: [phone]   Primary Care Physician: [name], NP   Chief Complaint  Patient presents with  . Gastroesophageal Reflux  . Anemia    HPI: Tina Mcdonald is a 78 y.o. female here for follow-up of GERD and microcytic anemia.  Is on omeprazole 20 mg once daily and this is controlling her symptoms well.  Denies any heartburn, dysphagia, weight loss, nausea or vomiting.  No coughing.  Due to her microcytic anemia, she also underwent EGD and colonoscopy in July 2020  EGD showed a 2 cm hiatal hernia.  Otherwise normal  Colonoscopy showed 4, 3 to 4 mm polyps in the sigmoid colon, removed.  Diverticulosis also reported and hypertrophied anal papillae noted.  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  Small bowel mucosa with no pathology changes  Today, patient is also reporting palpitations that started 1 or 2 hours ago and shortness of breath.  Current Outpatient Medications  Medication Sig Dispense Refill  . acetaminophen (TYLENOL) 325 MG tablet Take 650 mg by mouth every 6 (six) hours as needed.    [name] atorvastatin (LIPITOR) 40 MG tablet Take 1 tablet (40 mg total) by mouth daily. 90 tablet 3  . blood glucose meter kit and supplies KIT Dispense based on patient and insurance preference. Use up to four times daily as directed. (FOR ICD-9 250.00, 250.01). 1 each 0  . ciclopirox (PENLAC) 8 % solution Apply topically at bedtime. Apply over nail and surrounding skin. Apply daily over previous coat. After seven (7) days, may remove with alcohol and continue cycle. 6.6 mL 1  . cloNIDine (CATAPRES) 0.1 MG tablet Take 0.1 mg by mouth 2 (two) times daily.    . diclofenac sodium (VOLTAREN) 1 % GEL Apply 2 g topically 2 (two) times daily as needed. 1 Tube 2  . ferrous sulfate 325 (65 FE) MG EC tablet Take 325 mg by mouth every other day.     . gabapentin (NEURONTIN)  100 MG capsule TAKE 3 CAPSULES BY MOUTH AT BEDTIME 90 capsule 0  . glucose blood test strip Check daily. ICD 10 E11.9 100 each 12  . hydrochlorothiazide (HYDRODIURIL) 25 MG tablet Take 1 tablet by mouth once daily 90 tablet 0  . metFORMIN (GLUCOPHAGE-XR) 500 MG 24 hr tablet Take 1 tablet (500 mg total) by mouth 2 (two) times daily. 360 tablet 2  . metoprolol succinate (TOPROL-XL) 50 MG 24 hr tablet Take 1 tablet by mouth once daily 90 tablet 0  . omeprazole (PRILOSEC) 20 MG capsule Take 1 capsule by mouth once daily 90 capsule 0   Current Facility-Administered Medications  Medication Dose Route Frequency Provider Last Rate Last Dose  . cyanocobalamin ((VITAMIN B-12)) injection 1,000 mcg  1,000 mcg Intramuscular Q30 days Kathrine Haddock, NP   1,000 mcg at 02/17/19 1446    Allergies as of 04/19/2019 - Review Complete 04/19/2019  Allergen Reaction Noted  . Contrast media [iodinated diagnostic agents] Itching 11/22/2013  . Penicillins Itching 11/22/2013  . Sulfa antibiotics Itching 11/22/2013  . Latex Rash 11/25/2013  . Neosporin [neomycin-bacitracin zn-polymyx] Itching and Rash 11/22/2013  . Other Rash 02/05/2017    ROS:  General: Negative for anorexia, weight loss, fever, chills, fatigue, weakness. ENT: Negative for hoarseness, difficulty swallowing , nasal congestion. CV: Negative for chest pain, angina, palpitations, dyspnea on exertion, peripheral edema.  Respiratory: Negative for dyspnea at rest, dyspnea  on exertion, cough, sputum, wheezing.  GI: See history of present illness. GU:  Negative for dysuria, hematuria, urinary incontinence, urinary frequency, nocturnal urination.  Endo: Negative for unusual weight change.    Physical Examination:   BP (!) 162/76 (BP Location: Left Arm, Patient Position: Sitting, Cuff Size: Normal)   Pulse (!) 101   Temp 98.5 F (36.9 C) (Oral)   Wt 167 lb 8 oz (76 kg)   LMP  (LMP Unknown)   BMI 30.64 kg/m   General: Well-nourished,  well-developed in no acute distress.  Eyes: No icterus. Conjunctivae pink. Mouth: Oropharyngeal mucosa moist and pink , no lesions erythema or exudate. Neck: Supple, Trachea midline Abdomen: Bowel sounds are normal, nontender, nondistended, no hepatosplenomegaly or masses, no abdominal bruits or hernia , no rebound or guarding.   Extremities: No lower extremity edema. No clubbing or deformities. Neuro: Alert and oriented x 3.  Grossly intact. Skin: Warm and dry, no jaundice.   Psych: Alert and cooperative, normal mood and affect.   Labs: CMP     Component Value Date/Time   NA 143 02/09/2019 1136   K 4.2 02/09/2019 1136   CL 104 02/09/2019 1136   CO2 23 02/09/2019 1136   GLUCOSE 102 (H) 02/09/2019 1136   GLUCOSE 132 (H) 02/05/2017 1719   BUN 23 02/09/2019 1136   CREATININE 1.07 (H) 02/09/2019 1136   CALCIUM 9.5 02/09/2019 1136   PROT 6.7 02/09/2019 1136   ALBUMIN 4.2 02/09/2019 1136   AST 14 02/09/2019 1136   ALT 15 02/09/2019 1136   ALKPHOS 67 02/09/2019 1136   BILITOT 0.2 02/09/2019 1136   GFRNONAA 51 (L) 02/09/2019 1136   GFRAA 58 (L) 02/09/2019 1136   Lab Results  Component Value Date   WBC 5.9 12/14/2018   HGB 10.7 (L) 12/14/2018   HCT 35.6 (L) 12/14/2018   MCV 77.9 (L) 12/14/2018   PLT 265 12/14/2018    Imaging Studies: No results found.  Assessment and Plan:   Tina Mcdonald is a 78 y.o. y/o female here for follow-up of GERD and microcytic anemia  GERD well controlled with omeprazole 1 daily Patient educated extensively on acid reflux lifestyle modification, including buying a bed wedge, not eating 3 hrs before bedtime, diet modifications, and handout given for the same.   (Risks of PPI use were discussed with patient including bone loss, C. Diff diarrhea, pneumonia, infections, CKD, electrolyte abnormalities.  Pt. Verbalizes understanding and chooses to continue the medication.)  Small bowel capsule study recommended due to microcytic anemia negative EGD  and colonoscopy.  However we will schedule this at a later time since patient is reporting palpitations and shortness of breath today needs to be evaluated in urgent care  Patient is agreeable to going to urgent care today and she will go downstairs to the [facility] urgent care center for further evaluation     Dr Vonda Antigua

## 2019-04-19 NOTE — ED Provider Notes (Signed)
[facility]    CSN: [id number] Arrival date & time: 04/19/19  1458  History   Chief Complaint Chief Complaint  Patient presents with  . Hypertension   HPI  78 year old female presents with HTN and palpitations.  Patient states that prior to going to her doctor's appointment today she was experiencing palpitations.  She informed the doctor of this during her visit.  Her blood pressure was mildly elevated at 162/76.  Heart rate was 101.  Patient also informed the physician that she has had some ongoing shortness of breath.  This appears to be a chronic issue.  She has a pulmonologist as well as a cardiologist.  No fever.  She states that her shortness of breath is primarily with exertion.  Patient states that she feels much better now.  She does not feel like her heart is racing.  No other reported symptoms.  No other complaints or concerns at this time.  PMH, Surgical Hx, Family Hx, Social History reviewed and updated as below.  Past Medical History:  Diagnosis Date  . Anemia   . Arthritis   . Chronic kidney disease   . Colon polyps   . Diabetes mellitus without complication ([place])   . Diabetic neuropathy ([place])   . Dyspnea   . GERD (gastroesophageal reflux disease)   . Hyperlipemia   . Hypertension   . Osteoarthritis   . Radiculopathy    weakness in right leg  . Sleep apnea    does not have her cpap anymore  . Stress incontinence   . Urticaria   . Vitamin B 12 deficiency   . Wears glasses    Patient Active Problem List   Diagnosis Date Noted  . Nail fungus 04/12/2019  . Diarrhea   . Diverticula, colon   . Hiatal hernia   . Sleep apnea 02/24/2017  . Urge incontinence 11/24/2016  . Benign neoplasm of sigmoid colon   . CKD (chronic kidney disease) stage 3, GFR 30-59 ml/min (HCC) 02/20/2015  . Polyp of sigmoid colon 01/16/2015  . Iron deficiency anemia 01/16/2015  . B12 deficiency 01/16/2015  . Radiculopathy 01/16/2015  . Uncontrolled type 2 diabetes  mellitus with chronic kidney disease ([place]) 01/16/2015  . Osteoarthritis 01/16/2015  . Hypertensive heart/kidney disease without HF and with CKD stage III 01/16/2015  . Hyperlipidemia associated with type 2 diabetes mellitus ([place]) 01/16/2015  . Diabetic neuropathy ([place]) 01/16/2015  . GERD (gastroesophageal reflux disease) 01/16/2015  . Pure hypercholesterolemia 01/16/2015  . Essential hypertension 01/16/2015   Past Surgical History:  Procedure Laterality Date  . ABDOMINAL HYSTERECTOMY    . APPENDECTOMY    . BREAST BIOPSY Right    right-neg  . BREAST BIOPSY Left    us/bx/clip-neg  . CARPAL TUNNEL RELEASE     right  . COLONOSCOPY    . COLONOSCOPY WITH PROPOFOL N/A 12/10/2015   Procedure: COLONOSCOPY WITH PROPOFOL;  Surgeon: Lucilla Lame, MD;  Location: [place];  Service: Endoscopy;  Laterality: N/A;  Diabetic - oral meds Sleep Apnea LATEX allergy  . COLONOSCOPY WITH PROPOFOL N/A 01/17/2019   Procedure: COLONOSCOPY WITH PROPOFOL;  Surgeon: Virgel Manifold, MD;  Location: [facility];  Service: Endoscopy;  Laterality: N/A;  . CYSTOSCOPY    . DILATION AND CURETTAGE OF UTERUS    . ESOPHAGOGASTRODUODENOSCOPY (EGD) WITH PROPOFOL N/A 01/17/2019   Procedure: ESOPHAGOGASTRODUODENOSCOPY (EGD) WITH PROPOFOL;  Surgeon: Virgel Manifold, MD;  Location: [facility];  Service: Endoscopy;  Laterality: N/A;  .  EYE SURGERY Bilateral    lens implant  . LUMBAR FUSION  2009  . POLYPECTOMY  12/10/2015   Procedure: POLYPECTOMY;  Surgeon: Lucilla Lame, MD;  Location: [place];  Service: Endoscopy;;  . POLYPECTOMY  01/17/2019   Procedure: POLYPECTOMY;  Surgeon: Virgel Manifold, MD;  Location: [place];  Service: Endoscopy;;  . RESECTION DISTAL CLAVICAL Right 11/25/2013   Procedure: RIGHT SHOULDER OPEN RESECTION DISTAL CLAVICAL EXCISION SOFT TISSUE TUMOR SHOULDER DEEP SUBFASCIAL INTRAMUSCULAR/DEBRIDEMENT/ROTATOR CUFF REPAIR/BICEPS  TENODESIS/MANIPULATION;  Surgeon: Renette Butters, MD;  Location: [place];  Service: Orthopedics;  Laterality: Right;  . SHOULDER ARTHROSCOPY WITH ROTATOR CUFF REPAIR AND SUBACROMIAL DECOMPRESSION Right 11/25/2013   Procedure: SHOULDER ARTHROSCOPY WITH DEBRIDEMENT ROTATOR CUFF REPAIR  A;  Surgeon: Renette Butters, MD;  Location: [place];  Service: Orthopedics;  Laterality: Right;   OB History   No obstetric history on file.    Home Medications    Prior to Admission medications   Medication Sig Start Date End Date Taking? Authorizing Provider  acetaminophen (TYLENOL) 325 MG tablet Take 650 mg by mouth every 6 (six) hours as needed.   Yes [provider]  atorvastatin (LIPITOR) 40 MG tablet Take 1 tablet (40 mg total) by mouth daily. 02/10/19  Yes [name], Jolene T, NP  blood glucose meter kit and supplies KIT Dispense based on patient and insurance preference. Use up to four times daily as directed. (FOR ICD-9 250.00, 250.01). 09/22/18  Yes [name], Jolene T, NP  ciclopirox (PENLAC) 8 % solution Apply topically at bedtime. Apply over nail and surrounding skin. Apply daily over previous coat. After seven (7) days, may remove with alcohol and continue cycle. 04/12/19  Yes [name], Jolene T, NP  cloNIDine (CATAPRES) 0.1 MG tablet Take 0.1 mg by mouth 2 (two) times daily.   Yes [provider]  ferrous sulfate 325 (65 FE) MG EC tablet Take 325 mg by mouth every other day.    Yes [provider]  gabapentin (NEURONTIN) 100 MG capsule TAKE 3 CAPSULES BY MOUTH AT BEDTIME 03/15/19  Yes [name], Jolene T, NP  glucose blood test strip Check daily. ICD 10 E11.9 01/16/15  Yes Kathrine Haddock, NP  hydrochlorothiazide (HYDRODIURIL) 25 MG tablet Take 1 tablet by mouth once daily 03/03/19  Yes [name], Jolene T, NP  metFORMIN (GLUCOPHAGE-XR) 500 MG 24 hr tablet Take 1 tablet (500 mg total) by mouth 2 (two) times daily. 03/30/19  Yes [name], Henrine Screws T, NP   metoprolol succinate (TOPROL-XL) 50 MG 24 hr tablet Take 1 tablet by mouth once daily 03/03/19  Yes [name], Jolene T, NP  omeprazole (PRILOSEC) 20 MG capsule Take 1 capsule by mouth once daily 04/18/19  Yes [name], Jolene T, NP  diclofenac sodium (VOLTAREN) 1 % GEL Apply 2 g topically 2 (two) times daily as needed. 11/17/18   [name], NP    Family History Family History  Problem Relation Age of Onset  . Heart disease Mother   . Diabetes Mother   . Hypertension Mother   . Heart disease Father   . Hypertension Father   . Diabetes Sister   . Hypertension Sister   . Heart disease Sister   . Kidney disease Sister   . Diabetes Sister   . Hypertension Sister   . Breast cancer Cousin 70  . Non-Hodgkin's lymphoma Maternal Uncle     Social History Social History   Tobacco Use  . Smoking status: Former Smoker  Packs/day: 1.00    Years: 15.00    Pack years: 15.00    Quit date: 11/22/1997    Years since quitting: 21.4  . Smokeless tobacco: Never Used  Substance Use Topics  . Alcohol use: No  . Drug use: No     Allergies   Contrast media [iodinated diagnostic agents], Penicillins, Sulfa antibiotics, Latex, Neosporin [neomycin-bacitracin zn-polymyx], and Other   Review of Systems Review of Systems  Respiratory: Positive for shortness of breath.   Cardiovascular: Positive for palpitations.   Physical Exam Triage Vital Signs ED Triage Vitals  Enc Vitals Group     BP 04/19/19 1512 (!) 155/90     Pulse Rate 04/19/19 1512 92     Resp 04/19/19 1512 18     Temp 04/19/19 1512 98.7 F (37.1 C)     Temp Source 04/19/19 1512 Oral     SpO2 04/19/19 1512 100 %     Weight 04/19/19 1513 167 lb (75.8 kg)     Height 04/19/19 1513 5' 1" (1.549 m)     Head Circumference --      Peak Flow --      Pain Score 04/19/19 1511 0     Pain Loc --      Pain Edu? --      Excl. in [place]? --    Updated Vital Signs BP (!) 155/90 (BP Location: Right Arm)   Pulse 92   Temp 98.7 F (37.1  C) (Oral)   Resp 18   Ht 5' 1" (1.549 m)   Wt 75.8 kg   LMP  (LMP Unknown)   SpO2 100%   BMI 31.55 kg/m   Visual Acuity Right Eye Distance:   Left Eye Distance:   Bilateral Distance:    Right Eye Near:   Left Eye Near:    Bilateral Near:     Physical Exam Vitals signs and nursing note reviewed.  Constitutional:      General: She is not in acute distress.    Appearance: Normal appearance. She is not ill-appearing.  HENT:     Head: Normocephalic and atraumatic.  Eyes:     General:        Right eye: No discharge.        Left eye: No discharge.     Conjunctiva/sclera: Conjunctivae normal.  Cardiovascular:     Rate and Rhythm: Normal rate.     Comments: Ectopy noted. Pulmonary:     Effort: Pulmonary effort is normal.     Breath sounds: Normal breath sounds. No wheezing, rhonchi or rales.  Neurological:     Mental Status: She is alert.  Psychiatric:        Mood and Affect: Mood normal.        Behavior: Behavior normal.    UC Treatments / Results  Labs (all labs ordered are listed, but only abnormal results are displayed) Labs Reviewed - No data to display  EKG Interpretation: Normal sinus rhythm at the rate of 85.  Occasional PVC.  No ST or T wave changes.  Radiology No results found.  Procedures Procedures (including critical care time)  Medications Ordered in UC Medications - No data to display  Initial Impression / Assessment and Plan / UC Course  I have reviewed the triage vital signs and the nursing notes.  Pertinent labs & imaging results that were available during my care of the patient were reviewed by me and considered in my medical decision making (see chart for details).  78 year old female presents for evaluation of elevated blood pressure, palpitations, and shortness of breath.  Patient is well-appearing at this time.  EKG with occasional PVC.  Shortness of breath appears to be a chronic issue.  BP mildly elevated.  Patient stable and is  okay to return home.  Advise follow-up with pulmonology and cardiology.  Final Clinical Impressions(s) / UC Diagnoses   Final diagnoses:  Palpitations  SOB (shortness of breath)     Discharge Instructions     Everything looks good.  Follow up with Cardiology/pulmonology.  Take care  Dr. Lacinda Axon    ED Prescriptions    None     PDMP not reviewed this encounter.   Coral Spikes, [place] 04/19/19 1657

## 2019-04-20 ENCOUNTER — Other Ambulatory Visit: Payer: Self-pay

## 2019-04-20 ENCOUNTER — Telehealth: Payer: Self-pay

## 2019-04-20 DIAGNOSIS — D509 Iron deficiency anemia, unspecified: Secondary | ICD-10-CM

## 2019-04-20 DIAGNOSIS — K219 Gastro-esophageal reflux disease without esophagitis: Secondary | ICD-10-CM

## 2019-04-20 NOTE — Telephone Encounter (Signed)
-----   Message from Shelby Mattocks, [place] sent at 04/19/2019  2:59 PM EDT ----- Scheduled capsule study

## 2019-04-20 NOTE — Telephone Encounter (Signed)
Trish said 05/04/19 would work for patient to have this. Called patient and she states 05/04/19 was fine. Went over instructions with patient and mailed instructions

## 2019-04-28 ENCOUNTER — Telehealth: Payer: Self-pay | Admitting: Gastroenterology

## 2019-04-28 NOTE — Telephone Encounter (Signed)
Pt left vm regarding not having received her instructions for her procedure next week

## 2019-04-28 NOTE — Telephone Encounter (Signed)
Informed patient that I would print off instructions. She is going to pick this up on 04/29/2019

## 2019-04-29 ENCOUNTER — Telehealth: Payer: Self-pay

## 2019-04-29 ENCOUNTER — Ambulatory Visit: Payer: Self-pay | Admitting: Pharmacist

## 2019-04-29 ENCOUNTER — Other Ambulatory Visit: Payer: Medicare Other | Attending: Gastroenterology

## 2019-04-29 NOTE — Chronic Care Management (AMB) (Signed)
  Chronic Care Management   Note  04/29/2019 Name: SAKHIA STIDD MRN: [id number] DOB: 1940-11-19  [name] is a 78 y.o. year old female who is a primary care patient of [name], [name], NP. The CCM team was consulted for assistance with chronic disease management and care coordination needs.    Attempted to contact patient to discuss medication management support needs; left HIPAA compliant message for her to return my call at her convenience.   Follow up plan: - If I do not hear back, will outreach patient again in the next 4-6 weeks for continued medication management support  Catie Darnelle Maffucci, PharmD Clinical Pharmacist [place] [phone]

## 2019-05-03 ENCOUNTER — Telehealth: Payer: Self-pay | Admitting: Gastroenterology

## 2019-05-03 NOTE — Telephone Encounter (Signed)
Pt is calling she states she just received a call that she will need to pay $295 for her procedure tomorrow and she does not have that money she states she may need to r/s please call pt

## 2019-05-03 NOTE — Telephone Encounter (Signed)
Informed patient that she can request them to bill her for this and they will bill her. Patient verbalized understanding

## 2019-05-04 ENCOUNTER — Encounter: Payer: Self-pay | Admitting: Certified Registered"

## 2019-05-04 ENCOUNTER — Ambulatory Visit
Admission: RE | Admit: 2019-05-04 | Discharge: 2019-05-04 | Disposition: A | Discharge status: Home / Self Care | Payer: Medicare Other | Attending: Gastroenterology | Admitting: Gastroenterology

## 2019-05-04 ENCOUNTER — Other Ambulatory Visit: Payer: Self-pay

## 2019-05-04 ENCOUNTER — Encounter
Admission: RE | Disposition: A | Discharge status: Home / Self Care | Payer: Self-pay | Source: Home / Self Care | Attending: Gastroenterology

## 2019-05-04 DIAGNOSIS — D508 Other iron deficiency anemias: Secondary | ICD-10-CM | POA: Diagnosis not present

## 2019-05-04 DIAGNOSIS — D509 Iron deficiency anemia, unspecified: Secondary | ICD-10-CM | POA: Insufficient documentation

## 2019-05-04 HISTORY — PX: GIVENS CAPSULE STUDY: SHX5432

## 2019-05-04 SURGERY — IMAGING PROCEDURE, GI TRACT, INTRALUMINAL, VIA CAPSULE

## 2019-05-05 ENCOUNTER — Encounter: Payer: Self-pay | Admitting: Gastroenterology

## 2019-05-05 ENCOUNTER — Telehealth: Payer: Self-pay

## 2019-05-05 NOTE — Telephone Encounter (Signed)
Called and spoke with patient, spoke about current meds. While on the phone patient wanted to know about the nail fungus that she has, she states that she has been using the medication that was given for 3 weeks and it is not better.    Copied from [place] [id number]. Topic: General - Other >> May 04, 2019  2:47 PM [place] A wrote: Patient returning [place] call and is requesting a callback. Please advise

## 2019-05-05 NOTE — Telephone Encounter (Signed)
Please alert her I will take at look at her nails again on 05/13/19 visit and determine next steps.  Also please ask her if she has scheduled to follow-up with cardiology and pulmonary as was advised at her recent urgent care visit.  Please tell her I also recommend she schedule to see them.  Thank you.

## 2019-05-05 NOTE — Telephone Encounter (Signed)
Patient notified, she states that she will contact them to get scheduled.

## 2019-05-09 ENCOUNTER — Other Ambulatory Visit: Payer: Self-pay | Admitting: Nurse Practitioner

## 2019-05-13 ENCOUNTER — Encounter: Payer: Self-pay | Admitting: Nurse Practitioner

## 2019-05-13 ENCOUNTER — Ambulatory Visit (INDEPENDENT_AMBULATORY_CARE_PROVIDER_SITE_OTHER): Payer: Medicare Other | Admitting: Nurse Practitioner

## 2019-05-13 ENCOUNTER — Telehealth: Payer: Self-pay

## 2019-05-13 ENCOUNTER — Other Ambulatory Visit: Payer: Self-pay

## 2019-05-13 DIAGNOSIS — D508 Other iron deficiency anemias: Secondary | ICD-10-CM

## 2019-05-13 DIAGNOSIS — E538 Deficiency of other specified B group vitamins: Secondary | ICD-10-CM | POA: Diagnosis not present

## 2019-05-13 DIAGNOSIS — E785 Hyperlipidemia, unspecified: Secondary | ICD-10-CM

## 2019-05-13 DIAGNOSIS — E1121 Type 2 diabetes mellitus with diabetic nephropathy: Secondary | ICD-10-CM | POA: Diagnosis not present

## 2019-05-13 DIAGNOSIS — E1169 Type 2 diabetes mellitus with other specified complication: Secondary | ICD-10-CM | POA: Diagnosis not present

## 2019-05-13 DIAGNOSIS — I1310 Hypertensive heart and chronic kidney disease without heart failure, with stage 1 through stage 4 chronic kidney disease, or unspecified chronic kidney disease: Secondary | ICD-10-CM | POA: Diagnosis not present

## 2019-05-13 DIAGNOSIS — N1831 Chronic kidney disease, stage 3a: Secondary | ICD-10-CM

## 2019-05-13 DIAGNOSIS — B351 Tinea unguium: Secondary | ICD-10-CM

## 2019-05-13 NOTE — Assessment & Plan Note (Signed)
Chronic, ongoing with recent A1C 6.1%.  Continue Metformin 500 MG twice a day and continuing to monitor blood sugar daily.  Plan to obtain outpatient labs and will adjust medication dose as needed.

## 2019-05-13 NOTE — Assessment & Plan Note (Signed)
Chronic, ongoing.  Continue current medication regimen and adjust as needed.  Lipid panel next visit.    

## 2019-05-13 NOTE — Assessment & Plan Note (Signed)
Continue monthly injections and obtain outpatient B12 level. 

## 2019-05-13 NOTE — Assessment & Plan Note (Signed)
Referral to dermatology due to poor response with Penlac and ongoing issues.

## 2019-05-13 NOTE — Patient Instructions (Signed)
Carbohydrate Counting for Diabetes Mellitus, Adult  Carbohydrate counting is a method of keeping track of how many carbohydrates you eat. Eating carbohydrates naturally increases the amount of sugar (glucose) in the blood. Counting how many carbohydrates you eat helps keep your blood glucose within normal limits, which helps you manage your diabetes (diabetes mellitus). It is important to know how many carbohydrates you can safely have in each meal. This is different for every person. A diet and nutrition specialist (registered dietitian) can help you make a meal plan and calculate how many carbohydrates you should have at each meal and snack. Carbohydrates are found in the following foods:  Grains, such as breads and cereals.  Dried beans and soy products.  Starchy vegetables, such as potatoes, peas, and corn.  Fruit and fruit juices.  Milk and yogurt.  Sweets and snack foods, such as cake, cookies, candy, chips, and soft drinks. How do I count carbohydrates? There are two ways to count carbohydrates in food. You can use either of the methods or a combination of both. Reading "Nutrition Facts" on packaged food The "Nutrition Facts" list is included on the labels of almost all packaged foods and beverages in the [place] It includes:  The serving size.  Information about nutrients in each serving, including the grams (g) of carbohydrate per serving. To use the "Nutrition Facts":  Decide how many servings you will have.  Multiply the number of servings by the number of carbohydrates per serving.  The resulting number is the total amount of carbohydrates that you will be having. Learning standard serving sizes of other foods When you eat carbohydrate foods that are not packaged or do not include "Nutrition Facts" on the label, you need to measure the servings in order to count the amount of carbohydrates:  Measure the foods that you will eat with a food scale or measuring cup, if needed.   Decide how many standard-size servings you will eat.  Multiply the number of servings by 15. Most carbohydrate-rich foods have about 15 g of carbohydrates per serving. ? For example, if you eat 8 oz (170 g) of strawberries, you will have eaten 2 servings and 30 g of carbohydrates (2 servings x 15 g = 30 g).  For foods that have more than one food mixed, such as soups and casseroles, you must count the carbohydrates in each food that is included. The following list contains standard serving sizes of common carbohydrate-rich foods. Each of these servings has about 15 g of carbohydrates:   hamburger bun or  English muffin.   oz (15 mL) syrup.   oz (14 g) jelly.  1 slice of bread.  1 six-inch tortilla.  3 oz (85 g) cooked rice or pasta.  4 oz (113 g) cooked dried beans.  4 oz (113 g) starchy vegetable, such as peas, corn, or potatoes.  4 oz (113 g) hot cereal.  4 oz (113 g) mashed potatoes or  of a large baked potato.  4 oz (113 g) canned or frozen fruit.  4 oz (120 mL) fruit juice.  4-6 crackers.  6 chicken nuggets.  6 oz (170 g) unsweetened dry cereal.  6 oz (170 g) plain fat-free yogurt or yogurt sweetened with artificial sweeteners.  8 oz (240 mL) milk.  8 oz (170 g) fresh fruit or one small piece of fruit.  24 oz (680 g) popped popcorn. Example of carbohydrate counting Sample meal  3 oz (85 g) chicken breast.  6 oz (170 g)   brown rice.  4 oz (113 g) corn.  8 oz (240 mL) milk.  8 oz (170 g) strawberries with sugar-free whipped topping. Carbohydrate calculation 1. Identify the foods that contain carbohydrates: ? Rice. ? Corn. ? Milk. ? Strawberries. 2. Calculate how many servings you have of each food: ? 2 servings rice. ? 1 serving corn. ? 1 serving milk. ? 1 serving strawberries. 3. Multiply each number of servings by 15 g: ? 2 servings rice x 15 g = 30 g. ? 1 serving corn x 15 g = 15 g. ? 1 serving milk x 15 g = 15 g. ? 1 serving  strawberries x 15 g = 15 g. 4. Add together all of the amounts to find the total grams of carbohydrates eaten: ? 30 g + 15 g + 15 g + 15 g = 75 g of carbohydrates total. Summary  Carbohydrate counting is a method of keeping track of how many carbohydrates you eat.  Eating carbohydrates naturally increases the amount of sugar (glucose) in the blood.  Counting how many carbohydrates you eat helps keep your blood glucose within normal limits, which helps you manage your diabetes.  A diet and nutrition specialist (registered dietitian) can help you make a meal plan and calculate how many carbohydrates you should have at each meal and snack. This information is not intended to replace advice given to you by your health care provider. Make sure you discuss any questions you have with your health care provider. Document Released: 06/09/2005 Document Revised: 01/01/2017 Document Reviewed: 11/21/2015 Elsevier Patient Education  2020 Elsevier Inc.  

## 2019-05-13 NOTE — Assessment & Plan Note (Signed)
Chronic, ongoing, not checking BP at home as recommended. Continue to recommend checking BP at home a few mornings a week and documenting.  Continue current medication regimen and adjust as needed.  CMP today. Would consider addition of ACE or ARB with d/c of HCTZ at next visit, will discuss with patient as unsure she has tried these before.

## 2019-05-13 NOTE — Progress Notes (Signed)
LMP  (LMP Unknown)    Subjective:    Patient ID: Tina Mcdonald, female    DOB: 1941-05-17, 78 y.o.   MRN: [id number]  HPI: Tina Mcdonald is a 78 y.o. female  Chief Complaint  Patient presents with   Diabetes   Hyperlipidemia   Hypertension   Nail Problem    pt states her nails still havent cleared up   Results    pt states she never got the results from capsule study      This visit was completed via D[employer]imity due to the restrictions of the COVID-19 pandemic. All issues as above were discussed and addressed. Physical exam was done as above through visual confirmation on Doximity. If it was felt that the patient should be evaluated in the office, they were directed there. The patient verbally consented to this visit.  Location of the patient: home  Location of the provider: home  Those involved with this call:   Provider: Marnee Guarneri, DNP  CMA: Yvonna Alanis, [place] Desk/Registration: Jill Side   Time spent on call: 15 minutes with patient face to face via video conference. More than 50% of this time was spent in counseling and coordination of care. 10 minutes total spent in review of patient's record and preparation of their chart.   I verified patient identity using two factors (patient name and date of birth). Patient consents verbally to being seen via telemedicine visit today.    DIABETES Continues on Metformin and Gabapentin.  Last A1C 6.1% August.  Continues on B12 and Slow Fe for anemia, last saw Dr. Tasia Catchings in June 2020.  She has seen GI and colonoscopy and EGD showed benign findings on review.  Does have a hiatal hernia.  Had capsule study recently, but no results noted in chart at this time.  Hypoglycemic episodes:no Polydipsia/polyuria: no Visual disturbance: no Chest pain: no Paresthesias: no Glucose Monitoring: yes             Accucheck frequency: Daily             Fasting glucose: had a couple days of 130 range, but on average 108  to 120             Post prandial:             Evening:             Before meals: Taking Insulin?: no             Long acting insulin:             Short acting insulin: Blood Pressure Monitoring: not checking Retinal Examination: Up To Date -- had done two weeks ago (Dr. Gloriann Loan, [facility]) Foot Exam: Up to Date Pneumovax: Up to Date Influenza: Up to Date Aspirin: no   HYPERTENSION / HYPERLIPIDEMIA Continues on HCTZ and Clonidine + Lipitor.  Did go to UC on 04/19/2019 for palpitations with EKG NSR at 85 and occasional PVC noted.  She was instructed to follow-up with her cardiologist and pulmonary.  Last saw Dr. Clayborn Bigness 03/22/2019. She reports that she is not scheduled for follow-up, she is not concerned.  Feels that they "jumped the gun on it" at [facility], as she had some SOB with walking longer distance that day and when she "settled down I was fine" and reports being fine since this time with no further issues.   Satisfied with current treatment? yes Duration of hypertension: chronic BP monitoring frequency: not  checking BP range:  BP medication side effects: no Duration of hyperlipidemia: chronic Cholesterol medication side effects: no Cholesterol supplements: none Medication compliance: good compliance Aspirin: no Recent stressors: no Recurrent headaches: no Visual changes: no Palpitations: no Dyspnea: no Chest pain: no Lower extremity edema: no Dizzy/lightheaded: no  NAIL PROBLEM: Had nail polish taken off nails 04/11/2019.  First noticed current issue 2 prior with discoloration of finger nails and left thumb nail slightly coming off.  Left hand worse than right.  Has recently started iron every day, which has improved anemia and can not correlate this to her nails.  Denies pain to nail beds and no drainage.  Does endorse getting nails done every 2-3 weeks at a local salon.  Reports some discoloration being present years ago when she once got acrylic nails, which she no longer  does.  Was given Penlac on 04/12/2019 with no improvement, states initially looking better but since then not improving.  Has been using gel nails now.   Relevant past medical, surgical, family and social history reviewed and updated as indicated. Interim medical history since our last visit reviewed. Allergies and medications reviewed and updated.  Review of Systems  Constitutional: Negative for activity change, appetite change, diaphoresis, fatigue and fever.  Respiratory: Negative for cough, chest tightness and shortness of breath.   Cardiovascular: Negative for chest pain, palpitations and leg swelling.  Gastrointestinal: Negative for abdominal distention, abdominal pain, constipation, diarrhea, nausea and vomiting.  Endocrine: Negative for cold intolerance, heat intolerance, polydipsia, polyphagia and polyuria.  Neurological: Negative for dizziness, syncope, weakness, light-headedness, numbness and headaches.  Psychiatric/Behavioral: Negative.     Per HPI unless specifically indicated above     Objective:    LMP  (LMP Unknown)   Wt Readings from Last 3 Encounters:  04/19/19 167 lb (75.8 kg)  04/19/19 167 lb 8 oz (76 kg)  03/30/19 169 lb (76.7 kg)    Physical Exam Vitals signs and nursing note reviewed.  Constitutional:      General: She is awake. She is not in acute distress.    Appearance: She is well-developed. She is not ill-appearing.  HENT:     Head: Normocephalic.     Right Ear: Hearing normal.     Left Ear: Hearing normal.  Eyes:     General: Lids are normal.        Right eye: No discharge.        Left eye: No discharge.     Conjunctiva/sclera: Conjunctivae normal.  Neck:     Musculoskeletal: Normal range of motion.  Pulmonary:     Effort: Pulmonary effort is normal. No accessory muscle usage or respiratory distress.  Skin:    Comments: Able to briefly view via virtual, poor connection, discolored, thick nails noted bilateral hands.  Neurological:      Mental Status: She is alert and oriented to person, place, and time.  Psychiatric:        Attention and Perception: Attention normal.        Mood and Affect: Mood normal.        Behavior: Behavior normal. Behavior is cooperative.        Thought Content: Thought content normal.        Judgment: Judgment normal.     Results for orders placed or performed in visit on 02/09/19  Bayer DCA Hb A1c Waived  Result Value Ref Range   HB A1C (BAYER DCA - WAIVED) 6.1 <7.0 %  Comprehensive metabolic panel  Result Value  Ref Range   Glucose 102 (H) 65 - 99 mg/dL   BUN 23 8 - 27 mg/dL   Creatinine, Ser 1.07 (H) 0.57 - 1.00 mg/dL   GFR calc non Af Amer 51 (L) >59 mL/min/1.73   GFR calc Af Amer 58 (L) >59 mL/min/1.73   BUN/Creatinine Ratio 21 12 - 28   Sodium 143 134 - 144 mmol/L   Potassium 4.2 3.5 - 5.2 mmol/L   Chloride 104 96 - 106 mmol/L   CO2 23 20 - 29 mmol/L   Calcium 9.5 8.7 - 10.3 mg/dL   Total Protein 6.7 6.0 - 8.5 g/dL   Albumin 4.2 3.7 - 4.7 g/dL   Globulin, Total 2.5 1.5 - 4.5 g/dL   Albumin/Globulin Ratio 1.7 1.2 - 2.2   Bilirubin Total 0.2 0.0 - 1.2 mg/dL   Alkaline Phosphatase 67 39 - 117 IU/L   AST 14 0 - 40 IU/L   ALT 15 0 - 32 IU/L  Lipid Panel w/o Chol/HDL Ratio  Result Value Ref Range   Cholesterol, Total 187 100 - 199 mg/dL   Triglycerides 104 0 - 149 mg/dL   HDL 49 >39 mg/dL   VLDL Cholesterol Cal 21 5 - 40 mg/dL   LDL Calculated 117 (H) 0 - 99 mg/dL      Assessment & Plan:   Problem List Items Addressed This Visit      Cardiovascular and Mediastinum   Hypertensive heart/kidney disease without HF and with CKD stage III    Chronic, ongoing, not checking BP at home as recommended. Continue to recommend checking BP at home a few mornings a week and documenting.  Continue current medication regimen and adjust as needed.  CMP today. Would consider addition of ACE or ARB with d/c of HCTZ at next visit, will discuss with patient as unsure she has tried these before.          Endocrine   Type 2 diabetes mellitus with chronic kidney disease (HCC) - Primary    Chronic, ongoing with recent A1C 6.1%.  Continue Metformin 500 MG twice a day and continuing to monitor blood sugar daily.  Plan to obtain outpatient labs and will adjust medication dose as needed.      Relevant Orders   Comprehensive metabolic panel   Bayer DCA Hb A1c Waived   Hyperlipidemia associated with type 2 diabetes mellitus (HCC)    Chronic, ongoing.  Continue current medication regimen and adjust as needed.  Lipid panel next visit.      Relevant Orders   Comprehensive metabolic panel   15m Lipid Panel     Musculoskeletal and Integument   Nail fungus    Referral to dermatology due to poor response with Penlac and ongoing issues.      Relevant Orders   Ambulatory referral to Dermatology     Other   Iron deficiency anemia    Chronic, ongoing.  Continue current medication regimen and collaboration with hematology. Obtain outpatient labs.      Relevant Orders   CBC with Differential/Platelet out   Anemia panel   B12 deficiency    Continue monthly injections and obtain outpatient B12 level.      Relevant Orders   CBC with Differential/Platelet out   Anemia panel      I discussed the assessment and treatment plan with the patient. The patient was provided an opportunity to ask questions and all were answered. The patient agreed with the plan and demonstrated an understanding of the instructions.  The patient was advised to call back or seek an in-person evaluation if the symptoms worsen or if the condition fails to improve as anticipated.   I provided 15 minutes of time during this encounter.  Follow up plan: Return in about 3 months (around 08/13/2019) for T2DM, HTN/HLD, Anemia (plus outpatient labs).

## 2019-05-13 NOTE — Assessment & Plan Note (Signed)
Chronic, ongoing.  Continue current medication regimen and collaboration with hematology. Obtain outpatient labs.

## 2019-05-16 ENCOUNTER — Telehealth: Payer: Self-pay | Admitting: Nurse Practitioner

## 2019-05-16 NOTE — Telephone Encounter (Signed)
Copied from [place] [id number]. Topic: Appointment Scheduling - Scheduling Inquiry for Clinic >> May 16, 2019  2:40 PM Rayann Heman wrote: Reason for CRM: pt called and stated that she would like to schedule an appointment for labs but has a cough. Please advise >> May 16, 2019  4:38 PM Stark Klein wrote: Is it ok that the pt come in?

## 2019-05-16 NOTE — Telephone Encounter (Signed)
Patient notified

## 2019-05-18 ENCOUNTER — Telehealth: Payer: Self-pay

## 2019-05-18 DIAGNOSIS — D509 Iron deficiency anemia, unspecified: Secondary | ICD-10-CM

## 2019-05-18 NOTE — Telephone Encounter (Signed)
Dr. Bonna Gains go Video capsule back and it showed 1 small non bleeding AVM vs red spot. She recommends a referral to hematologist and follow up with her. Put in referral and patient verbalized understanding

## 2019-05-23 ENCOUNTER — Other Ambulatory Visit: Payer: Self-pay | Admitting: Oncology

## 2019-05-23 ENCOUNTER — Other Ambulatory Visit: Payer: Self-pay

## 2019-05-23 ENCOUNTER — Other Ambulatory Visit: Payer: Medicare Other

## 2019-05-23 ENCOUNTER — Ambulatory Visit (INDEPENDENT_AMBULATORY_CARE_PROVIDER_SITE_OTHER): Payer: Medicare Other

## 2019-05-23 DIAGNOSIS — D509 Iron deficiency anemia, unspecified: Secondary | ICD-10-CM

## 2019-05-23 DIAGNOSIS — E538 Deficiency of other specified B group vitamins: Secondary | ICD-10-CM

## 2019-05-23 DIAGNOSIS — N1831 Chronic kidney disease, stage 3a: Secondary | ICD-10-CM

## 2019-05-23 DIAGNOSIS — E1169 Type 2 diabetes mellitus with other specified complication: Secondary | ICD-10-CM | POA: Diagnosis not present

## 2019-05-23 DIAGNOSIS — D508 Other iron deficiency anemias: Secondary | ICD-10-CM | POA: Diagnosis not present

## 2019-05-23 DIAGNOSIS — E1121 Type 2 diabetes mellitus with diabetic nephropathy: Secondary | ICD-10-CM | POA: Diagnosis not present

## 2019-05-23 DIAGNOSIS — E785 Hyperlipidemia, unspecified: Secondary | ICD-10-CM | POA: Diagnosis not present

## 2019-05-23 LAB — LIPID PANEL PICCOLO, WAIVED
Chol/HDL Ratio Piccolo,Waive: 3.1 mg/dL
Cholesterol Piccolo, Waived: 185 mg/dL (ref <200)
HDL Chol Piccolo, Waived: 59 mg/dL (ref >59)
LDL Chol Calc Piccolo Waived: 91 mg/dL (ref <100)
Triglycerides Piccolo,Waived: 175 mg/dL — ABNORMAL HIGH (ref <150)
VLDL Chol Calc Piccolo,Waive: 35 mg/dL — ABNORMAL HIGH (ref <30)

## 2019-05-23 LAB — BAYER DCA HB A1C WAIVED: HB A1C (BAYER DCA - WAIVED): 6.8 % (ref <7.0)

## 2019-05-23 NOTE — Progress Notes (Signed)
cbc

## 2019-05-24 ENCOUNTER — Encounter: Payer: Self-pay | Admitting: Gastroenterology

## 2019-05-25 ENCOUNTER — Encounter: Payer: Self-pay | Admitting: Gastroenterology

## 2019-05-25 LAB — CBC WITH DIFFERENTIAL/PLATELET
Basophils Absolute: 0 10*3/uL (ref 0.0–0.2)
Basos: 0 %
EOS (ABSOLUTE): 0.1 10*3/uL (ref 0.0–0.4)
Eos: 3 %
Hemoglobin: 10.7 g/dL — ABNORMAL LOW (ref 11.1–15.9)
Immature Grans (Abs): 0 10*3/uL (ref 0.0–0.1)
Immature Granulocytes: 0 %
Lymphocytes Absolute: 2 10*3/uL (ref 0.7–3.1)
Lymphs: 39 %
MCH: 24.2 pg — ABNORMAL LOW (ref 26.6–33.0)
MCHC: 31.5 g/dL (ref 31.5–35.7)
MCV: 77 fL — ABNORMAL LOW (ref 79–97)
Monocytes Absolute: 0.4 10*3/uL (ref 0.1–0.9)
Monocytes: 7 %
Neutrophils Absolute: 2.7 10*3/uL (ref 1.4–7.0)
Neutrophils: 51 %
Platelets: 240 10*3/uL (ref 150–450)
RBC: 4.42 x10E6/uL (ref 3.77–5.28)
RDW: 14.9 % (ref 11.7–15.4)
WBC: 5.3 10*3/uL (ref 3.4–10.8)

## 2019-05-25 LAB — COMPREHENSIVE METABOLIC PANEL
ALT: 16 IU/L (ref 0–32)
AST: 18 IU/L (ref 0–40)
Albumin/Globulin Ratio: 1.8 (ref 1.2–2.2)
Albumin: 4.2 g/dL (ref 3.7–4.7)
Alkaline Phosphatase: 70 IU/L (ref 39–117)
BUN/Creatinine Ratio: 21 (ref 12–28)
BUN: 21 mg/dL (ref 8–27)
Bilirubin Total: 0.3 mg/dL (ref 0.0–1.2)
CO2: 22 mmol/L (ref 20–29)
Calcium: 9.6 mg/dL (ref 8.7–10.3)
Chloride: 102 mmol/L (ref 96–106)
Creatinine, Ser: 1 mg/dL (ref 0.57–1.00)
GFR calc Af Amer: 63 mL/min/{1.73_m2} (ref >59)
GFR calc non Af Amer: 55 mL/min/{1.73_m2} — ABNORMAL LOW (ref >59)
Globulin, Total: 2.3 g/dL (ref 1.5–4.5)
Glucose: 116 mg/dL — ABNORMAL HIGH (ref 65–99)
Potassium: 3.6 mmol/L (ref 3.5–5.2)
Sodium: 141 mmol/L (ref 134–144)
Total Protein: 6.5 g/dL (ref 6.0–8.5)

## 2019-05-25 LAB — ANEMIA PANEL
Ferritin: 102 ng/mL (ref 15–150)
Folate, Hemolysate: 316 ng/mL
Folate, RBC: 929 ng/mL (ref >498)
Hematocrit: 34 % (ref 34.0–46.6)
Iron Saturation: 16 % (ref 15–55)
Iron: 45 ug/dL (ref 27–139)
Retic Ct Pct: 1.9 % (ref 0.6–2.6)
Total Iron Binding Capacity: 287 ug/dL (ref 250–450)
UIBC: 242 ug/dL (ref 118–369)
Vitamin B-12: >2000 pg/mL — ABNORMAL HIGH (ref 232–1245)

## 2019-06-03 ENCOUNTER — Telehealth: Payer: Self-pay | Admitting: Pharmacist

## 2019-06-03 ENCOUNTER — Ambulatory Visit (INDEPENDENT_AMBULATORY_CARE_PROVIDER_SITE_OTHER): Payer: Medicare Other | Admitting: Pharmacist

## 2019-06-03 DIAGNOSIS — I1310 Hypertensive heart and chronic kidney disease without heart failure, with stage 1 through stage 4 chronic kidney disease, or unspecified chronic kidney disease: Secondary | ICD-10-CM | POA: Diagnosis not present

## 2019-06-03 DIAGNOSIS — N1831 Chronic kidney disease, stage 3a: Secondary | ICD-10-CM

## 2019-06-03 DIAGNOSIS — E1121 Type 2 diabetes mellitus with diabetic nephropathy: Secondary | ICD-10-CM

## 2019-06-03 DIAGNOSIS — M8949 Other hypertrophic osteoarthropathy, multiple sites: Secondary | ICD-10-CM | POA: Diagnosis not present

## 2019-06-03 DIAGNOSIS — E1122 Type 2 diabetes mellitus with diabetic chronic kidney disease: Secondary | ICD-10-CM

## 2019-06-03 DIAGNOSIS — M159 Polyosteoarthritis, unspecified: Secondary | ICD-10-CM

## 2019-06-03 NOTE — Patient Instructions (Signed)
Visit Information  Goals Addressed            This Visit's Progress     Patient Stated   . PharmD "I want to take care of my medications" (pt-stated)       Current Barriers:  . Polypharmacy; complex patient with multiple comorbidities including T2DM, HTN, anemia, CKD, OA, GERD o Today, reports concerns about increased swelling in her feet, ankles toes. Bilateral, but worse on R than L. Did note one day her R leg was "tight", but improved since then. Worsens throughout the day. Denies warmth or pain. Has not been elevating feet much recently . Self-manages medications. Denies financial concerns o Anemia; currently taking Slow Fe daily, monthly B12 injections. On last anemia panel, ferritin, B12, and folate levels WNL; GI did not find any significant bleeds with endo/colonoscopy; patient referred to hematology. Lab work 12/15, appointment w/ Dr. Tasia Catchings on 12/22. Patient not aware of these appointments.  o T2DM; controlled, last A1c 6.8%; metformin 500 mg BID; patient asked what the A1c measures o ASCVD risk reduction:  - HTN: HCTZ 25 mg daily, clonidine 0.1 mg BID, metoprolol succinate 50 mg daily; patient does NOT have a BP cuff at home  - HLD: atorvastatin 40 mg daily; last LDL not at goal <70 o GERD: omeprazole 20 mg daily  o OA: diclofenac gel PRN to knees. Acetaminophen 650 mg; she notes she often takes up to 6 tablets daily   Pharmacist Clinical Goal(s):  [name] Over the next 90 days, patient will work with PharmD and provider towards optimized medication management  Interventions: . Comprehensive medication review performed; medication list updated in electronic medical record . Recommended she elevate her feet as often as possible, and to contact the office if swelling worsens. Will collaborate w/ PCP to determine if other suggestions or other work up needed. . Reviewed upcoming hematology lab work and appointment. Patient noted appreciation of the reminder. Educated on iron deficiency vs  B12/folate deficiency anemia vs normocytic anemia; reviewed that iron, B12, folate levels WNL, no areas of blood loss, so Dr. Tasia Catchings will help determine next steps for investigating cause of anemia. Patient verbalized understanding . Educated on hemoglobin A1c (vs hemoglobin), and goal <7% . Educated on Over the Counter benefits to purchase BP cuff. Recommended arm cuff vs wrist cuff. Patient notes she will call her insurance to investigate this, or purchase a cuff from her local pharmacy.  . Educated on max daily dose of APAP of 4 g daily. Recommended to NOT exceed 6 tabs of 650 mg daily (3900 mg).  Patient Self Care Activities:  . Patient will take medications as prescribed  Please see past updates related to this goal by clicking on the "Past Updates" button in the selected goal         The patient verbalized understanding of instructions provided today and declined a print copy of patient instruction materials.   Plan: - Will collaborate w/ PCP as above - CCM team will outreach patient over the next 6-8 weeks for continued chronic disease support  Catie Darnelle Maffucci, PharmD, [place] [phone]

## 2019-06-03 NOTE — Telephone Encounter (Signed)
Patient notified, appt scheduled.

## 2019-06-03 NOTE — Telephone Encounter (Signed)
Patient complained of leg swelling on our call earlier today. I recommended she elevate her legs more often.   Per Jolene, please ask patient to start wearing TED hose, and requested that patient be seen by her or another provider in office next week. Can someone help with that please? Thanks!

## 2019-06-03 NOTE — Chronic Care Management (AMB) (Signed)
Chronic Care Management   Follow Up Note   06/03/2019 Name: Tina Mcdonald MRN: [id number] DOB: 05/10/1941  Referred by: [name], NP Reason for referral : Chronic Care Management (Medication Management)   Tina Mcdonald is a 78 y.o. year old female who is a primary care patient of [name], [name], NP. The CCM team was consulted for assistance with chronic disease management and care coordination needs.    Contacted patient for medication management review.  Review of patient status, including review of consultants reports, relevant laboratory and other test results, and collaboration with appropriate care team members and the patient's provider was performed as part of comprehensive patient evaluation and provision of chronic care management services.    SDOH (Social Determinants of Health) screening performed today: None. See Care Plan for related entries.   Outpatient Encounter Medications as of 06/03/2019  Medication Sig Note  . acetaminophen (TYLENOL) 325 MG tablet Take 650 mg by mouth every 6 (six) hours as needed. 06/03/2019: Taking 6 650 mg daily = 3900 mg daily   . atorvastatin (LIPITOR) 40 MG tablet Take 1 tablet (40 mg total) by mouth daily.   . blood glucose meter kit and supplies KIT Dispense based on patient and insurance preference. Use up to four times daily as directed. (FOR ICD-9 250.00, 250.01).   . cloNIDine (CATAPRES) 0.1 MG tablet Take 0.1 mg by mouth 2 (two) times daily.   . diclofenac sodium (VOLTAREN) 1 % GEL Apply 2 g topically 2 (two) times daily as needed.   . Ferrous Sulfate (SLOW FE PO) Take 1 tablet by mouth daily.   . hydrochlorothiazide (HYDRODIURIL) 25 MG tablet Take 1 tablet by mouth once daily   . metFORMIN (GLUCOPHAGE-XR) 500 MG 24 hr tablet Take 1 tablet (500 mg total) by mouth 2 (two) times daily.   . metoprolol succinate (TOPROL-XL) 50 MG 24 hr tablet Take 1 tablet by mouth once daily 06/03/2019: QAM  . [DISCONTINUED] ferrous  sulfate 325 (65 FE) MG EC tablet Take 325 mg by mouth every other day.  06/03/2019: Slow Fe  . ciclopirox (PENLAC) 8 % solution Apply topically at bedtime. Apply over nail and surrounding skin. Apply daily over previous coat. After seven (7) days, may remove with alcohol and continue cycle.   . gabapentin (NEURONTIN) 100 MG capsule TAKE 3 CAPSULES BY MOUTH AT BEDTIME   . glucose blood test strip Check daily. ICD 10 E11.9   . omeprazole (PRILOSEC) 20 MG capsule Take 1 capsule by mouth once daily    Facility-Administered Encounter Medications as of 06/03/2019  Medication  . cyanocobalamin ((VITAMIN B-12)) injection 1,000 mcg     Goals Addressed            This Visit's Progress     Patient Stated   . PharmD "I want to take care of my medications" (pt-stated)       Current Barriers:  . Polypharmacy; complex patient with multiple comorbidities including T2DM, HTN, anemia, CKD, OA, GERD o Today, reports concerns about increased swelling in her feet, ankles toes. Bilateral, but worse on R than L. Did note one day her R leg was "tight", but improved since then. Worsens throughout the day. Denies warmth or pain. Has not been elevating feet much recently . Self-manages medications. Denies financial concerns o Anemia; currently taking Slow Fe daily, monthly B12 injections. On last anemia panel, ferritin, B12, and folate levels WNL; GI did not find any significant bleeds with endo/colonoscopy; patient referred to  hematology. Lab work 12/15, appointment w/ Dr. Tasia Catchings on 12/22. Patient not aware of these appointments.  o T2DM; controlled, last A1c 6.8%; metformin 500 mg BID; patient asked what the A1c measures o ASCVD risk reduction:  - HTN: HCTZ 25 mg daily, clonidine 0.1 mg BID, metoprolol succinate 50 mg daily; patient does NOT have a BP cuff at home  - HLD: atorvastatin 40 mg daily; last LDL not at goal <70 o GERD: omeprazole 20 mg daily  o OA: diclofenac gel PRN to knees. Acetaminophen 650 mg; she  notes she often takes up to 6 tablets daily   Pharmacist Clinical Goal(s):  [name] Over the next 90 days, patient will work with PharmD and provider towards optimized medication management  Interventions: . Comprehensive medication review performed; medication list updated in electronic medical record . Recommended she elevate her feet as often as possible, and to contact the office if swelling worsens. Will collaborate w/ PCP to determine if other suggestions or other work up needed. . Reviewed upcoming hematology lab work and appointment. Patient noted appreciation of the reminder. Educated on iron deficiency vs B12/folate deficiency anemia vs normocytic anemia; reviewed that iron, B12, folate levels WNL, no areas of blood loss, so Dr. Tasia Catchings will help determine next steps for investigating cause of anemia. Patient verbalized understanding . Educated on hemoglobin A1c (vs hemoglobin), and goal <7% . Educated on Over the Counter benefits to purchase BP cuff. Recommended arm cuff vs wrist cuff. Patient notes she will call her insurance to investigate this, or purchase a cuff from her local pharmacy.  . Educated on max daily dose of APAP of 4 g daily. Recommended to NOT exceed 6 tabs of 650 mg daily (3900 mg).  Patient Self Care Activities:  . Patient will take medications as prescribed  Please see past updates related to this goal by clicking on the "Past Updates" button in the selected goal          Plan: - Will collaborate w/ PCP as above - CCM team will outreach patient over the next 6-8 weeks for continued chronic disease support  Catie Darnelle Maffucci, PharmD, [place] [phone]

## 2019-06-06 ENCOUNTER — Other Ambulatory Visit: Payer: Self-pay | Admitting: Nurse Practitioner

## 2019-06-06 ENCOUNTER — Ambulatory Visit: Payer: Self-pay | Admitting: Nurse Practitioner

## 2019-06-07 ENCOUNTER — Inpatient Hospital Stay: Payer: Medicare Other | Attending: Oncology

## 2019-06-07 ENCOUNTER — Other Ambulatory Visit: Payer: Self-pay

## 2019-06-07 DIAGNOSIS — M2550 Pain in unspecified joint: Secondary | ICD-10-CM | POA: Insufficient documentation

## 2019-06-07 DIAGNOSIS — R718 Other abnormality of red blood cells: Secondary | ICD-10-CM | POA: Insufficient documentation

## 2019-06-07 DIAGNOSIS — D631 Anemia in chronic kidney disease: Secondary | ICD-10-CM | POA: Diagnosis not present

## 2019-06-07 DIAGNOSIS — G8929 Other chronic pain: Secondary | ICD-10-CM | POA: Insufficient documentation

## 2019-06-07 DIAGNOSIS — N183 Chronic kidney disease, stage 3 unspecified: Secondary | ICD-10-CM | POA: Insufficient documentation

## 2019-06-07 DIAGNOSIS — Z87891 Personal history of nicotine dependence: Secondary | ICD-10-CM | POA: Diagnosis not present

## 2019-06-07 DIAGNOSIS — R5383 Other fatigue: Secondary | ICD-10-CM | POA: Diagnosis not present

## 2019-06-07 DIAGNOSIS — D509 Iron deficiency anemia, unspecified: Secondary | ICD-10-CM

## 2019-06-07 LAB — CBC WITH DIFFERENTIAL/PLATELET
Abs Immature Granulocytes: 0.01 10*3/uL (ref 0.00–0.07)
Basophils Absolute: 0 10*3/uL (ref 0.0–0.1)
Basophils Relative: 0 %
Eosinophils Absolute: 0.2 10*3/uL (ref 0.0–0.5)
Eosinophils Relative: 4 %
HCT: 34.8 % — ABNORMAL LOW (ref 36.0–46.0)
Hemoglobin: 10.2 g/dL — ABNORMAL LOW (ref 12.0–15.0)
Immature Granulocytes: 0 %
Lymphocytes Relative: 31 %
Lymphs Abs: 1.5 10*3/uL (ref 0.7–4.0)
MCH: 23.5 pg — ABNORMAL LOW (ref 26.0–34.0)
MCHC: 29.3 g/dL — ABNORMAL LOW (ref 30.0–36.0)
MCV: 80.2 fL (ref 80.0–100.0)
Monocytes Absolute: 0.5 10*3/uL (ref 0.1–1.0)
Monocytes Relative: 10 %
Neutro Abs: 2.7 10*3/uL (ref 1.7–7.7)
Neutrophils Relative %: 55 %
Platelets: 248 10*3/uL (ref 150–400)
RBC: 4.34 MIL/uL (ref 3.87–5.11)
RDW: 15.7 % — ABNORMAL HIGH (ref 11.5–15.5)
WBC: 4.8 10*3/uL (ref 4.0–10.5)
nRBC: 0 % (ref 0.0–0.2)

## 2019-06-07 LAB — IRON AND TIBC
Iron: 55 ug/dL (ref 28–170)
Saturation Ratios: 17 % (ref 10.4–31.8)
TIBC: 328 ug/dL (ref 250–450)
UIBC: 273 ug/dL

## 2019-06-07 LAB — FERRITIN: Ferritin: 53 ng/mL (ref 11–307)

## 2019-06-14 ENCOUNTER — Encounter: Payer: Self-pay | Admitting: Oncology

## 2019-06-14 ENCOUNTER — Inpatient Hospital Stay (HOSPITAL_BASED_OUTPATIENT_CLINIC_OR_DEPARTMENT_OTHER): Payer: Medicare Other | Admitting: Oncology

## 2019-06-14 DIAGNOSIS — D631 Anemia in chronic kidney disease: Secondary | ICD-10-CM

## 2019-06-14 DIAGNOSIS — N183 Chronic kidney disease, stage 3 unspecified: Secondary | ICD-10-CM | POA: Diagnosis not present

## 2019-06-14 DIAGNOSIS — R718 Other abnormality of red blood cells: Secondary | ICD-10-CM

## 2019-06-14 MED ORDER — VITAMIN C 250 MG PO TABS: 500.0000 mg | ORAL_TABLET | Freq: Every day | ORAL | 2 refills | Status: DC

## 2019-06-14 NOTE — Progress Notes (Signed)
Chronic fatigue stable.

## 2019-06-14 NOTE — Progress Notes (Signed)
HEMATOLOGY-ONCOLOGY TeleHEALTH VISIT PROGRESS NOTE  I connected with Tina Mcdonald on 06/14/19 at  2:15 PM EST by video enabled telemedicine visit and verified that I am speaking with the correct person using two identifiers. I discussed the limitations, risks, security and privacy concerns of performing an evaluation and management service by telemedicine and the availability of in-person appointments. I also discussed with the patient that there may be a patient responsible charge related to this service. The patient expressed understanding and agreed to proceed.   Other persons participating in the visit and their role in the encounter:  None  Patient's location: Home  Provider's location: office Chief Complaint: Follow-up for anemia   INTERVAL HISTORY Tina Mcdonald is a 78 y.o. female who has above history reviewed by me today presents for follow up visit for management of anemia Problems and complaints are listed below:  Patient reports feeling well at baseline.  Chronic fatigue.  Not worsened better. Denies any blood in the stool or black tarry stool. Chronic joint pain takes Tylenol as needed.  She took NSAIDs in the past and to she was advised to stop by primary care provider.  Review of Systems  Constitutional: Positive for fatigue. Negative for appetite change, chills and fever.  HENT:   Negative for hearing loss and voice change.   Eyes: Negative for eye problems.  Respiratory: Negative for chest tightness and cough.   Cardiovascular: Negative for chest pain.  Gastrointestinal: Negative for abdominal distention, abdominal pain and blood in stool.  Endocrine: Negative for hot flashes.  Genitourinary: Negative for difficulty urinating and frequency.   Musculoskeletal: Negative for arthralgias.  Skin: Negative for itching and rash.  Neurological: Negative for extremity weakness.  Hematological: Negative for adenopathy.  Psychiatric/Behavioral: Negative for confusion.     Past Medical History:  Diagnosis Date  . Anemia   . Arthritis   . Chronic kidney disease   . Colon polyps   . Diabetes mellitus without complication ([place])   . Diabetic neuropathy ([place])   . Dyspnea   . GERD (gastroesophageal reflux disease)   . Hyperlipemia   . Hypertension   . Osteoarthritis   . Radiculopathy    weakness in right leg  . Sleep apnea    does not have her cpap anymore  . Stress incontinence   . Urticaria   . Vitamin B 12 deficiency   . Wears glasses    Past Surgical History:  Procedure Laterality Date  . ABDOMINAL HYSTERECTOMY    . APPENDECTOMY    . BREAST BIOPSY Right    right-neg  . BREAST BIOPSY Left    us/bx/clip-neg  . CARPAL TUNNEL RELEASE     right  . COLONOSCOPY    . COLONOSCOPY WITH PROPOFOL N/A 12/10/2015   Procedure: COLONOSCOPY WITH PROPOFOL;  Surgeon: Lucilla Lame, MD;  Location: [facility];  Service: Endoscopy;  Laterality: N/A;  Diabetic - oral meds Sleep Apnea LATEX allergy  . COLONOSCOPY WITH PROPOFOL N/A 01/17/2019   Procedure: COLONOSCOPY WITH PROPOFOL;  Surgeon: Virgel Manifold, MD;  Location: [facility];  Service: Endoscopy;  Laterality: N/A;  . CYSTOSCOPY    . DILATION AND CURETTAGE OF UTERUS    . ESOPHAGOGASTRODUODENOSCOPY (EGD) WITH PROPOFOL N/A 01/17/2019   Procedure: ESOPHAGOGASTRODUODENOSCOPY (EGD) WITH PROPOFOL;  Surgeon: Virgel Manifold, MD;  Location: [facility];  Service: Endoscopy;  Laterality: N/A;  . EYE SURGERY Bilateral    lens implant  . GIVENS CAPSULE STUDY N/A 05/04/2019  Procedure: GIVENS CAPSULE STUDY;  Surgeon: Virgel Manifold, MD;  Location: [facility];  Service: Endoscopy;  Laterality: N/A;  . LUMBAR FUSION  2009  . POLYPECTOMY  12/10/2015   Procedure: POLYPECTOMY;  Surgeon: Lucilla Lame, MD;  Location: [facility];  Service: Endoscopy;;  . POLYPECTOMY  01/17/2019   Procedure: POLYPECTOMY;  Surgeon: Virgel Manifold, MD;  Location: [facility];   Service: Endoscopy;;  . RESECTION DISTAL CLAVICAL Right 11/25/2013   Procedure: RIGHT SHOULDER OPEN RESECTION DISTAL CLAVICAL EXCISION SOFT TISSUE TUMOR SHOULDER DEEP SUBFASCIAL INTRAMUSCULAR/DEBRIDEMENT/ROTATOR CUFF REPAIR/BICEPS TENODESIS/MANIPULATION;  Surgeon: Renette Butters, MD;  Location: [facility];  Service: Orthopedics;  Laterality: Right;  . SHOULDER ARTHROSCOPY WITH ROTATOR CUFF REPAIR AND SUBACROMIAL DECOMPRESSION Right 11/25/2013   Procedure: SHOULDER ARTHROSCOPY WITH DEBRIDEMENT ROTATOR CUFF REPAIR  A;  Surgeon: Renette Butters, MD;  Location: [facility];  Service: Orthopedics;  Laterality: Right;    Family History  Problem Relation Age of Onset  . Heart disease Mother   . Diabetes Mother   . Hypertension Mother   . Heart disease Father   . Hypertension Father   . Diabetes Sister   . Hypertension Sister   . Heart disease Sister   . Kidney disease Sister   . Diabetes Sister   . Hypertension Sister   . Breast cancer Cousin 38  . Non-Hodgkin's lymphoma Maternal Uncle     Social History   Socioeconomic History  . Marital status: Single    Spouse name: Not on file  . Number of children: Not on file  . Years of education: Not on file  . Highest education level: Some college, no degree  Occupational History  . Occupation: retired  Tobacco Use  . Smoking status: Former Smoker    Packs/day: 1.00    Years: 15.00    Pack years: 15.00    Quit date: 11/22/1997    Years since quitting: 21.5  . Smokeless tobacco: Never Used  Substance and Sexual Activity  . Alcohol use: No  . Drug use: No  . Sexual activity: Not Currently  Other Topics Concern  . Not on file  Social History Narrative  . Not on file   Social Determinants of Health   Financial Resource Strain:   . Difficulty of Paying Living Expenses: Not on file  Food Insecurity:   . Worried About Charity fundraiser in the Last Year: Not on file  . Ran Out of Food in the Last Year: Not  on file  Transportation Needs:   . Lack of Transportation (Medical): Not on file  . Lack of Transportation (Non-Medical): Not on file  Physical Activity:   . Days of Exercise per Week: Not on file  . Minutes of Exercise per Session: Not on file  Stress:   . Feeling of Stress : Not on file  Social Connections:   . Frequency of Communication with Friends and Family: Not on file  . Frequency of Social Gatherings with Friends and Family: Not on file  . Attends Religious Services: Not on file  . Active Member of Clubs or Organizations: Not on file  . Attends Archivist Meetings: Not on file  . Marital Status: Not on file  Intimate Partner Violence:   . Fear of Current or Ex-Partner: Not on file  . Emotionally Abused: Not on file  . Physically Abused: Not on file  . Sexually Abused: Not on file    Current Outpatient Medications  on File Prior to Visit  Medication Sig Dispense Refill  . acetaminophen (TYLENOL) 325 MG tablet Take 650 mg by mouth every 6 (six) hours as needed.    [name] atorvastatin (LIPITOR) 40 MG tablet Take 1 tablet (40 mg total) by mouth daily. 90 tablet 3  . blood glucose meter kit and supplies KIT Dispense based on patient and insurance preference. Use up to four times daily as directed. (FOR ICD-9 250.00, 250.01). 1 each 0  . ciclopirox (PENLAC) 8 % solution Apply topically at bedtime. Apply over nail and surrounding skin. Apply daily over previous coat. After seven (7) days, may remove with alcohol and continue cycle. 6.6 mL 1  . cloNIDine (CATAPRES) 0.1 MG tablet Take 0.1 mg by mouth 2 (two) times daily.    . diclofenac sodium (VOLTAREN) 1 % GEL Apply 2 g topically 2 (two) times daily as needed. 1 Tube 2  . Ferrous Sulfate (SLOW FE PO) Take 1 tablet by mouth daily.    [name] gabapentin (NEURONTIN) 100 MG capsule TAKE 3 CAPSULES BY MOUTH AT BEDTIME 90 capsule 0  . glucose blood test strip Check daily. ICD 10 E11.9 100 each 12  . hydrochlorothiazide (HYDRODIURIL) 25 MG  tablet Take 1 tablet by mouth once daily 90 tablet 0  . metFORMIN (GLUCOPHAGE-XR) 500 MG 24 hr tablet Take 1 tablet (500 mg total) by mouth 2 (two) times daily. 360 tablet 2  . metoprolol succinate (TOPROL-XL) 50 MG 24 hr tablet Take 1 tablet by mouth once daily 90 tablet 0  . omeprazole (PRILOSEC) 20 MG capsule Take 1 capsule by mouth once daily 90 capsule 0   Current Facility-Administered Medications on File Prior to Visit  Medication Dose Route Frequency Provider Last Rate Last Admin  . cyanocobalamin ((VITAMIN B-12)) injection 1,000 mcg  1,000 mcg Intramuscular Q30 days Kathrine Haddock, NP   1,000 mcg at 05/23/19 1455    Allergies  Allergen Reactions  . Contrast Media [Iodinated Diagnostic Agents] Itching  . Penicillins Itching    Has patient had a PCN reaction causing immediate rash, facial/tongue/throat swelling, SOB or lightheadedness with hypotension: Yes Has patient had a PCN reaction causing severe rash involving mucus membranes or skin necrosis: Unknown Has patient had a PCN reaction that required hospitalization: Yes Has patient had a PCN reaction occurring within the last 10 years: Yes If all of the above answers are "NO", then may proceed with Cephalosporin use.  . Sulfa Antibiotics Itching  . Latex Rash  . Neosporin [Neomycin-Bacitracin Zn-Polymyx] Itching and Rash  . Other Rash    Pt has reaction to high acidity foods.        Observations/Objective: Today's Vitals   06/14/19 1333  PainSc: 0-No pain   There is no height or weight on file to calculate BMI.  Physical Exam  Constitutional: She is oriented to person, place, and time. No distress.  Neurological: She is alert and oriented to person, place, and time.  Psychiatric: Mood normal.    CBC    Component Value Date/Time   WBC 4.8 06/07/2019 1439   RBC 4.34 06/07/2019 1439   HGB 10.2 (L) 06/07/2019 1439   HGB 10.7 (L) 05/23/2019 1539   HCT 34.8 (L) 06/07/2019 1439   HCT 34.0 05/23/2019 1539   PLT 248  06/07/2019 1439   PLT 240 05/23/2019 1539   MCV 80.2 06/07/2019 1439   MCV 77 (L) 05/23/2019 1539   MCH 23.5 (L) 06/07/2019 1439   MCHC 29.3 (L) 06/07/2019 1439  RDW 15.7 (H) 06/07/2019 1439   RDW 14.9 05/23/2019 1539   LYMPHSABS 1.5 06/07/2019 1439   LYMPHSABS 2.0 05/23/2019 1539   MONOABS 0.5 06/07/2019 1439   EOSABS 0.2 06/07/2019 1439   EOSABS 0.1 05/23/2019 1539   BASOSABS 0.0 06/07/2019 1439   BASOSABS 0.0 05/23/2019 1539    CMP     Component Value Date/Time   NA 141 05/23/2019 1539   K 3.6 05/23/2019 1539   CL 102 05/23/2019 1539   CO2 22 05/23/2019 1539   GLUCOSE 116 (H) 05/23/2019 1539   GLUCOSE 132 (H) 02/05/2017 1719   BUN 21 05/23/2019 1539   CREATININE 1.00 05/23/2019 1539   CALCIUM 9.6 05/23/2019 1539   PROT 6.5 05/23/2019 1539   ALBUMIN 4.2 05/23/2019 1539   AST 18 05/23/2019 1539   ALT 16 05/23/2019 1539   ALKPHOS 70 05/23/2019 1539   BILITOT 0.3 05/23/2019 1539   GFRNONAA 55 (L) 05/23/2019 1539   GFRAA 63 05/23/2019 1539     Assessment and Plan: 1. Anemia due to stage 3 chronic kidney disease, unspecified whether stage 3a or 3b CKD   2. Microcytosis     Labs reviewed and discussed with patient. Anemia, likely secondary to CKD. Recommend patient to continue oral iron supplementation Slow Fe once daily, add vitamin C 500 mg daily to help with the absorption. Currently hemoglobin is above 10, no need for erythropoietin therapy. If hemoglobin drop below 10, will need to discuss about IV iron infusion before starting erythropoietin therapy.  Chronic microcytosis, inappropriately to iron level. Rule out thalassemia.  Result is pending.    Follow Up Instructions: 6 months   I discussed the assessment and treatment plan with the patient. The patient was provided an opportunity to ask questions and all were answered. The patient agreed with the plan and demonstrated an understanding of the instructions.  The patient was advised to call back or seek  an in-person evaluation if the symptoms worsen or if the condition fails to improve as anticipated.    Earlie Server, MD 06/14/2019 8:21 PM

## 2019-06-15 LAB — ALPHA-THALASSEMIA GENOTYPR

## 2019-06-20 DIAGNOSIS — B351 Tinea unguium: Secondary | ICD-10-CM | POA: Diagnosis not present

## 2019-06-23 ENCOUNTER — Ambulatory Visit: Payer: Self-pay

## 2019-06-23 ENCOUNTER — Other Ambulatory Visit: Payer: Self-pay | Admitting: Nurse Practitioner

## 2019-06-23 ENCOUNTER — Telehealth: Payer: Self-pay | Admitting: Nurse Practitioner

## 2019-06-23 MED ORDER — CICLOPIROX 8 % EX SOLN: Freq: Every day | CUTANEOUS | 1 refills | Status: DC

## 2019-06-23 NOTE — Telephone Encounter (Signed)
Pt. Reports her dermatologist prescribed Fluconazole for finger nail fungus. Concerned about all of the side effects and wants to know if it is safe for her to take with her health history. Please advise.   Answer Assessment - Initial Assessment Questions 1.   NAME of MEDICATION: "What medicine are you calling about?"     Fluconazole 2.   QUESTION: "What is your question?"     Pt. Concerned about taking the medication because of side effects 3.   PRESCRIBING HCP: "Who prescribed it?" Reason: if prescribed by specialist, call should be referred to that group.     Dermotology 4. SYMPTOMS: "Do you have any symptoms?"     No 5. SEVERITY: If symptoms are present, ask "Are they mild, moderate or severe?"     n/a 6.  PREGNANCY:  "Is there any chance that you are pregnant?" "When was your last menstrual period?"     No  Protocols used: MEDICATION QUESTION CALL-A-AH

## 2019-06-23 NOTE — Telephone Encounter (Signed)
Patient states that she has to take 1 a week for 4 weeks.

## 2019-06-23 NOTE — Telephone Encounter (Signed)
Thank you.  I spoke to her just now further about it.

## 2019-06-23 NOTE — Telephone Encounter (Signed)
Please alert patient that at this time it is okay for her to take.  Last liver function was good.  I would recommend she not take Lipitor during the period she is taking Fluconazole, as this could cause interaction.  She can restart Lipitor after she has completed Fluconazole doses.  How long does she have to take this for?

## 2019-07-04 ENCOUNTER — Other Ambulatory Visit: Payer: Self-pay | Admitting: Nurse Practitioner

## 2019-07-04 NOTE — Telephone Encounter (Signed)
Requested Prescriptions  Pending Prescriptions Disp Refills  . gabapentin (NEURONTIN) 100 MG capsule [Pharmacy Med Name: GABAPENTIN 100MG     CAP] 90 capsule 0    Sig: TAKE 3 CAPSULES BY MOUTH AT BEDTIME     Neurology: Anticonvulsants - gabapentin Passed - 07/04/2019  3:42 PM      Passed - Valid encounter within last 12 months    Recent Outpatient Visits          1 month ago Type 2 diabetes mellitus with stage 3a chronic kidney disease, without long-term current use of insulin ([place])   [place], [place] T, NP   2 months ago Nail fungus   [place], [place] T, NP   3 months ago B12 deficiency   [place], [place] T, NP   4 months ago Uncontrolled type 2 diabetes mellitus with chronic kidney disease ([place])   [place], Jolene T, NP   7 months ago Uncontrolled type 2 diabetes mellitus with chronic kidney disease ([place])   [place], [name], NP      Future Appointments            In 1 month [name], [name], NP [employer]

## 2019-07-13 ENCOUNTER — Telehealth: Payer: Self-pay

## 2019-07-18 ENCOUNTER — Other Ambulatory Visit: Payer: Self-pay | Admitting: Nurse Practitioner

## 2019-07-29 ENCOUNTER — Telehealth: Payer: Self-pay | Admitting: Nurse Practitioner

## 2019-07-29 NOTE — Telephone Encounter (Signed)
Copied from [place] [id number]. Topic: General - Inquiry >> Jul 29, 2019  3:13 PM Scherrie Gerlach wrote: Reason for CRM: pt is having her covid vaccine on Monday, and has her b12 scheduled for wed.  Same week.  Is that ok? >> Jul 29, 2019  3:28 PM Georgina Peer, CMA wrote: Routing to provider. Is that ok?

## 2019-07-29 NOTE — Telephone Encounter (Signed)
Patient notified

## 2019-08-02 ENCOUNTER — Ambulatory Visit (INDEPENDENT_AMBULATORY_CARE_PROVIDER_SITE_OTHER): Payer: Medicare Other | Admitting: Pharmacist

## 2019-08-02 DIAGNOSIS — E1169 Type 2 diabetes mellitus with other specified complication: Secondary | ICD-10-CM | POA: Diagnosis not present

## 2019-08-02 DIAGNOSIS — E785 Hyperlipidemia, unspecified: Secondary | ICD-10-CM | POA: Diagnosis not present

## 2019-08-02 DIAGNOSIS — D508 Other iron deficiency anemias: Secondary | ICD-10-CM

## 2019-08-02 NOTE — Patient Instructions (Signed)
Visit Information  Goals Addressed            This Visit's Progress     Patient Stated   . PharmD "I want to take care of my medications" (pt-stated)       Current Barriers:  . Polypharmacy; complex patient with multiple comorbidities including T2DM, HTN, anemia, CKD, OA, GERD o Notes she got her first COVID shot yesterday. Arm and legs are bit sore today, but wonders if she lifted something heavy to irritate her leg muscles . Self-manages medications.  o Anemia; currently taking Slow Fe daily, started Vitamin C 500 mg daily per hematology. Wonders if she can take 250 mg BID, instead of 500 mg QAM; monthly B12 injections; has injection scheduled tomorrow o T2DM; controlled, last A1c 6.8%; metformin 500 mg BID; o ASCVD risk reduction:  - HTN: HCTZ 25 mg daily, clonidine 0.1 mg BID, metoprolol succinate 50 mg daily;  - HLD: atorvastatin 40 mg daily; last LDL at goal <100, but could consider a more stringent goal of <70  o GERD: omeprazole 20 mg daily  o OA: diclofenac gel PRN to knees. Acetaminophen 650 mg up to 6 tabs daily PRN o Nail fungus: Notes she saw dermatology and they prescribed Jublia, but was too expensive. Then used topical terbinafine. Now is on weekly fluconazole 200 mg. Wonders if she needs to request a refill from dermatology   Pharmacist Clinical Goal(s):  [name] Over the next 90 days, patient will work with PharmD and provider towards optimized medication management  Interventions: . Comprehensive medication review performed; medication list updated in electronic medical record . Encouraged to contact dermatology [place] to determine intended duration of fluconazole therapy. She verbalized understanding . Recommended she take both Vitamin C together in the morning w/ iron supplement to enhance iron absorption. She verbalized understanding.  . Reviewed common sx of COVID vaccination . Moving forward, could consider increasing atorvastatin to 80 mg daily.  However, patient is holding atorvastatin during fluconazole therapy, so next LDL reading may not be truly accurate. Continue to monitor.   Patient Self Care Activities:  . Patient will take medications as prescribed  Please see past updates related to this goal by clicking on the "Past Updates" button in the selected goal         The patient verbalized understanding of instructions provided today and declined a print copy of patient instruction materials.   Plan:  - Scheduled f/u call 09/07/19   Catie Darnelle Maffucci, PharmD, [place] [phone]

## 2019-08-02 NOTE — Chronic Care Management (AMB) (Signed)
Chronic Care Management   Follow Up Note   08/02/2019 Name: Tina Mcdonald MRN: [id number] DOB: [birth year]  Referred by: [name], NP Reason for referral : Chronic Care Management (Medication Management)   Tina Mcdonald is a 79 y.o. year old female who is a primary care patient of [name], [name], NP. The CCM team was consulted for assistance with chronic disease management and care coordination needs.    Contacted patient for medication management review.   Review of patient status, including review of consultants reports, relevant laboratory and other test results, and collaboration with appropriate care team members and the patient's provider was performed as part of comprehensive patient evaluation and provision of chronic care management services.    SDOH (Social Determinants of Health) screening performed today: None. See Care Plan for related entries.   Outpatient Encounter Medications as of 08/02/2019  Medication Sig Note  . acetaminophen (TYLENOL) 325 MG tablet Take 650 mg by mouth every 6 (six) hours as needed. 06/03/2019: Taking 6 650 mg daily = 3900 mg daily   . atorvastatin (LIPITOR) 40 MG tablet Take 1 tablet (40 mg total) by mouth daily.   . blood glucose meter kit and supplies KIT Dispense based on patient and insurance preference. Use up to four times daily as directed. (FOR ICD-9 250.00, 250.01).   . ciclopirox (PENLAC) 8 % solution Apply topically at bedtime. Apply over nail and surrounding skin. Apply daily over previous coat. After seven (7) days, may remove with alcohol and continue cycle.   . cloNIDine (CATAPRES) 0.1 MG tablet Take 0.1 mg by mouth 2 (two) times daily.   . Ferrous Sulfate (SLOW FE PO) Take 1 tablet by mouth daily.   . fluconazole (DIFLUCAN) 200 MG tablet Take 200 mg by mouth once a week.   . gabapentin (NEURONTIN) 100 MG capsule TAKE 3 CAPSULES BY MOUTH AT BEDTIME   . hydrochlorothiazide (HYDRODIURIL) 25 MG tablet Take 1 tablet by mouth  once daily   . metFORMIN (GLUCOPHAGE-XR) 500 MG 24 hr tablet Take 1 tablet (500 mg total) by mouth 2 (two) times daily.   . metoprolol succinate (TOPROL-XL) 50 MG 24 hr tablet Take 1 tablet by mouth once daily   . omeprazole (PRILOSEC) 20 MG capsule Take 1 capsule by mouth once daily   . vitamin C (ASCORBIC ACID) 250 MG tablet Take 2 tablets (500 mg total) by mouth daily.   . diclofenac sodium (VOLTAREN) 1 % GEL Apply 2 g topically 2 (two) times daily as needed.   [name] glucose blood test strip Check daily. ICD 10 E11.9    Facility-Administered Encounter Medications as of 08/02/2019  Medication  . cyanocobalamin ((VITAMIN B-12)) injection 1,000 mcg     Objective:   Goals Addressed            This Visit's Progress     Patient Stated   . PharmD "I want to take care of my medications" (pt-stated)       Current Barriers:  . Polypharmacy; complex patient with multiple comorbidities including T2DM, HTN, anemia, CKD, OA, GERD o Notes she got her first COVID shot yesterday. Arm and legs are bit sore today, but wonders if she lifted something heavy to irritate her leg muscles . Self-manages medications.  o Anemia; currently taking Slow Fe daily, started Vitamin C 500 mg daily per hematology. Wonders if she can take 250 mg BID, instead of 500 mg QAM; monthly B12 injections; has injection scheduled tomorrow o T2DM; controlled,  last A1c 6.8%; metformin 500 mg BID; o ASCVD risk reduction:  - HTN: HCTZ 25 mg daily, clonidine 0.1 mg BID, metoprolol succinate 50 mg daily;  - HLD: atorvastatin 40 mg daily; last LDL at goal <100, but could consider a more stringent goal of <70  o GERD: omeprazole 20 mg daily  o OA: diclofenac gel PRN to knees. Acetaminophen 650 mg up to 6 tabs daily PRN o Nail fungus: Notes she saw dermatology and they prescribed Jublia, but was too expensive. Then used topical terbinafine. Now is on weekly fluconazole 200 mg. Wonders if she needs to request a refill from dermatology    Pharmacist Clinical Goal(s):  [name] Over the next 90 days, patient will work with PharmD and provider towards optimized medication management  Interventions: . Comprehensive medication review performed; medication list updated in electronic medical record . Encouraged to contact dermatology [place] to determine intended duration of fluconazole therapy. She verbalized understanding . Recommended she take both Vitamin C together in the morning w/ iron supplement to enhance iron absorption. She verbalized understanding.  . Reviewed common sx of COVID vaccination . Moving forward, could consider increasing atorvastatin to 80 mg daily. However, patient is holding atorvastatin during fluconazole therapy, so next LDL reading may not be truly accurate. Continue to monitor.   Patient Self Care Activities:  . Patient will take medications as prescribed  Please see past updates related to this goal by clicking on the "Past Updates" button in the selected goal          Plan:  - Scheduled f/u call 09/07/19   Catie Darnelle Maffucci, PharmD, [place] [phone]

## 2019-08-03 ENCOUNTER — Other Ambulatory Visit: Payer: Self-pay

## 2019-08-03 ENCOUNTER — Ambulatory Visit (INDEPENDENT_AMBULATORY_CARE_PROVIDER_SITE_OTHER): Payer: Medicare Other

## 2019-08-03 DIAGNOSIS — E538 Deficiency of other specified B group vitamins: Secondary | ICD-10-CM

## 2019-08-15 ENCOUNTER — Other Ambulatory Visit: Payer: Self-pay | Admitting: Nurse Practitioner

## 2019-08-17 ENCOUNTER — Ambulatory Visit: Payer: Medicare Other | Admitting: Nurse Practitioner

## 2019-08-19 DIAGNOSIS — B372 Candidiasis of skin and nail: Secondary | ICD-10-CM | POA: Diagnosis not present

## 2019-08-20 ENCOUNTER — Encounter: Payer: Self-pay | Admitting: Nurse Practitioner

## 2019-08-21 IMAGING — MR MRI LUMBAR SPINE WITHOUT CONTRAST
5 series · 31 of 48 positions shown · non-contrast
Comparison: Lumbar radiographs 09/23/2018

CLINICAL DATA: Chronic midline low back pain. Prior back surgery
2998

EXAM:
MRI LUMBAR SPINE WITHOUT CONTRAST
TECHNIQUE: Multiplanar, multisequence MR imaging of the lumbar spine was
performed. No intravenous contrast was administered.

[Series 5: T2 · sagittal · 4.0mm · 0.81mm/px · 6 of 17 slices shown (1 of 2)]
[im 1/17]
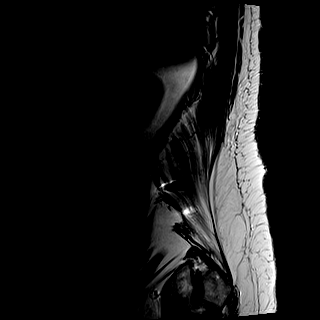
[im 4/17]
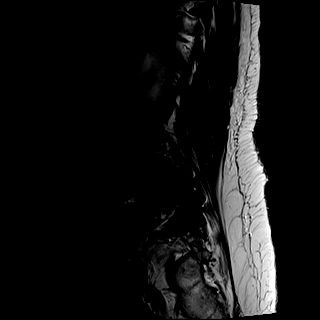
[im 7/17]
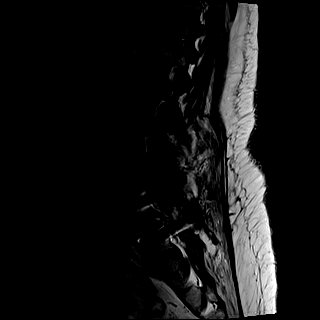
[im 10/17]
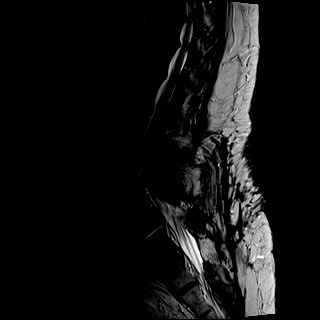
[im 13/17]
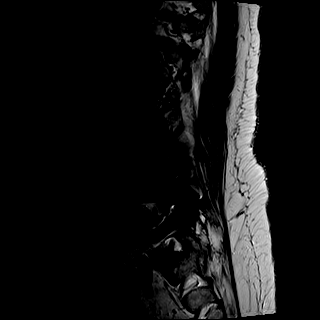
[im 17/17]
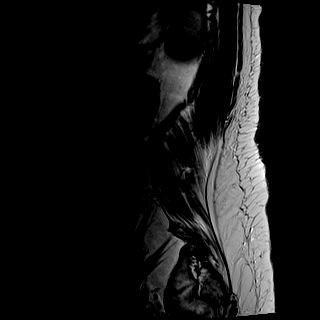

[Series 6: T1 · sagittal · 4.0mm · 0.81mm/px · 7 of 17 slices shown (1 of 2)]
[im 1/17]
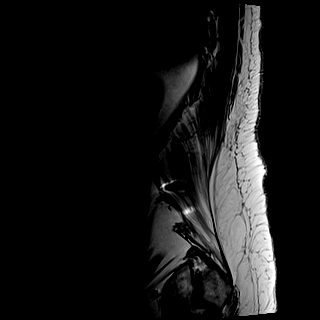
[im 3/17]
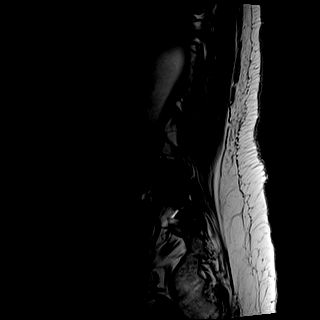
[im 6/17]
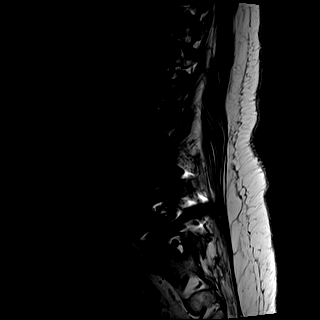
[im 9/17]
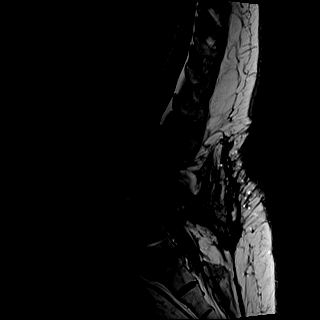
[im 11/17]
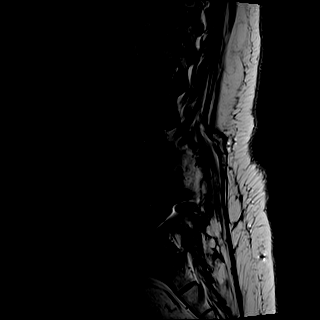
[im 14/17]
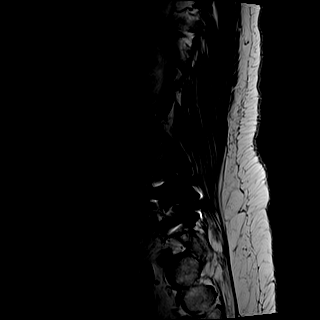
[im 17/17]
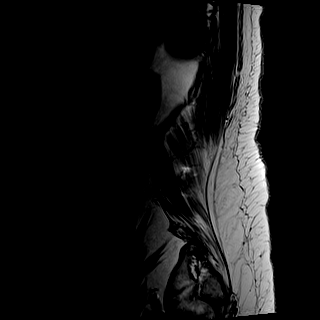

[Series 7: STIR · sagittal · 4.0mm · 0.41mm/px · 2 of 17 slices shown]
[im 1/17]
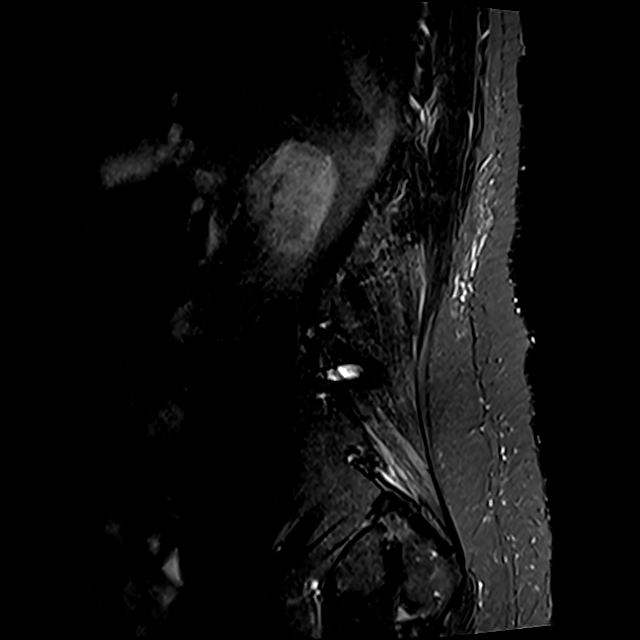
[im 3/17]
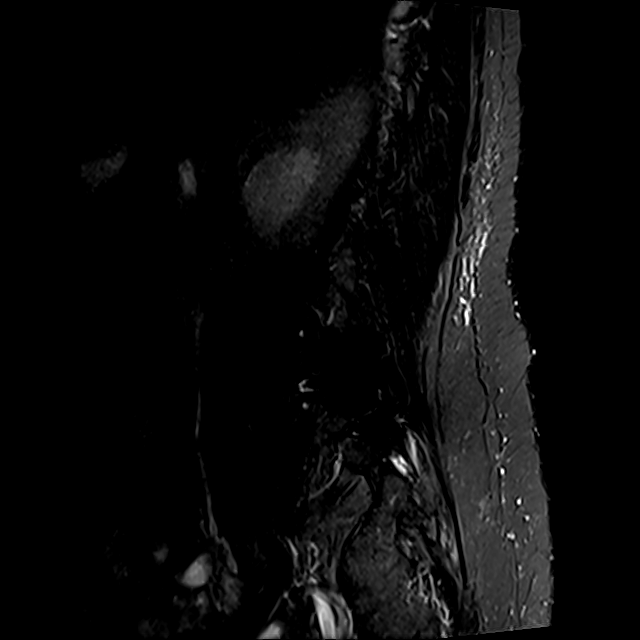

[Series 8: T2 · axial · 4.0mm · 0.78mm/px · z∈[-152,+68]mm · 8 of 34 slices shown (2 of 2)]
[im 1/34]
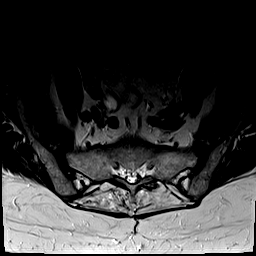
[im 6/34]
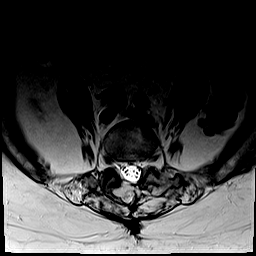
[im 11/34]
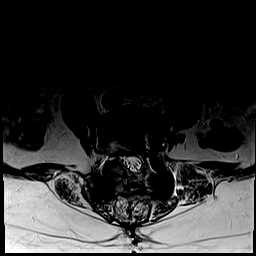
[im 16/34]
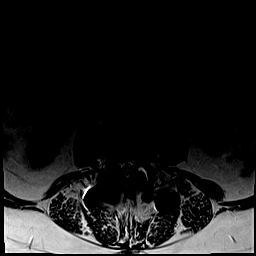
[im 18/34]
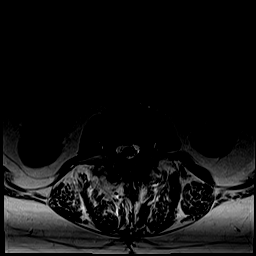
[im 23/34]
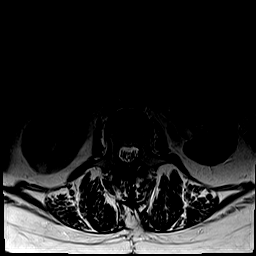
[im 28/34]
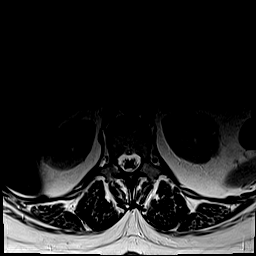
[im 34/34]
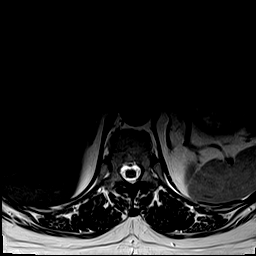

[Series 9: T1 · axial · 4.0mm · 0.39mm/px · z∈[-152,+68]mm · 8 of 34 slices shown (2 of 2)]
[im 1/34]
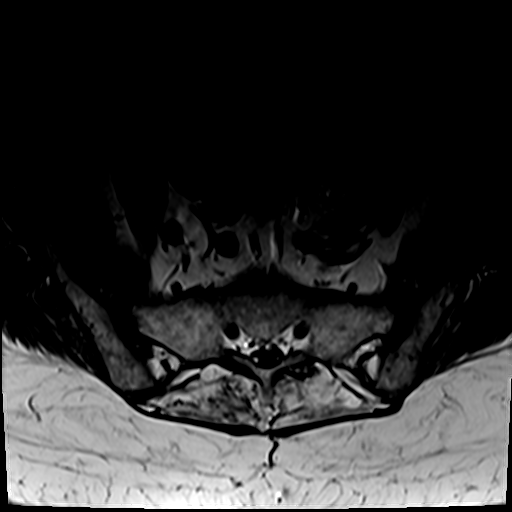
[im 6/34]
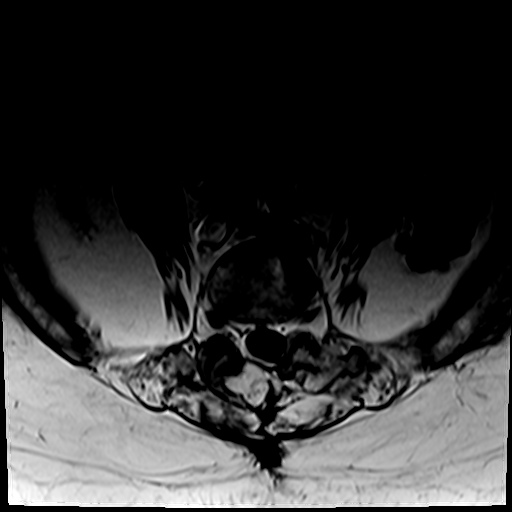
[im 11/34]
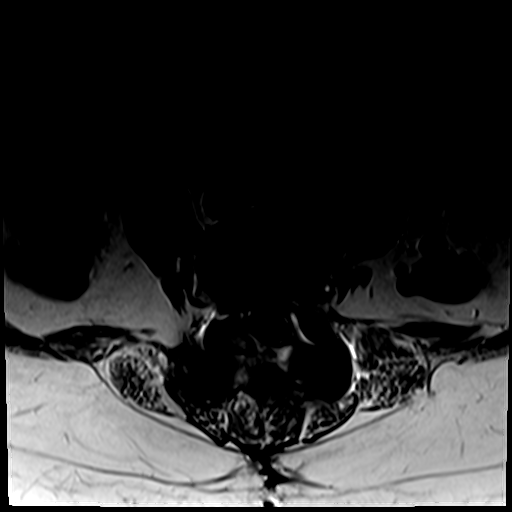
[im 16/34]
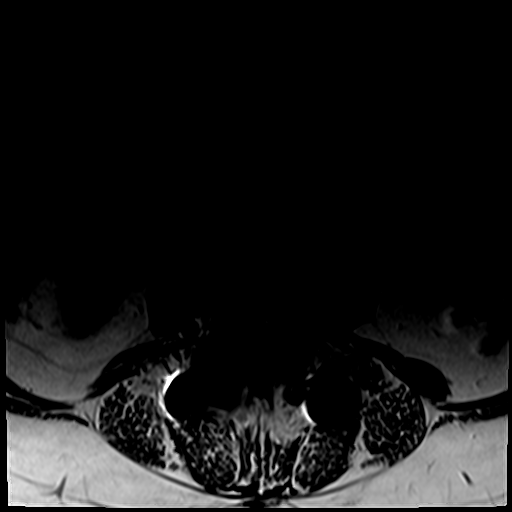
[im 18/34]
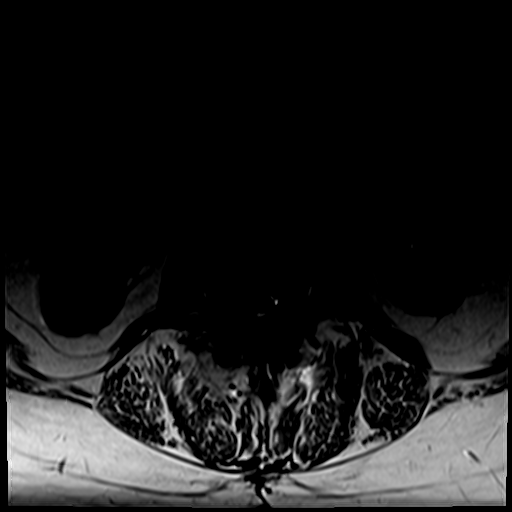
[im 23/34]
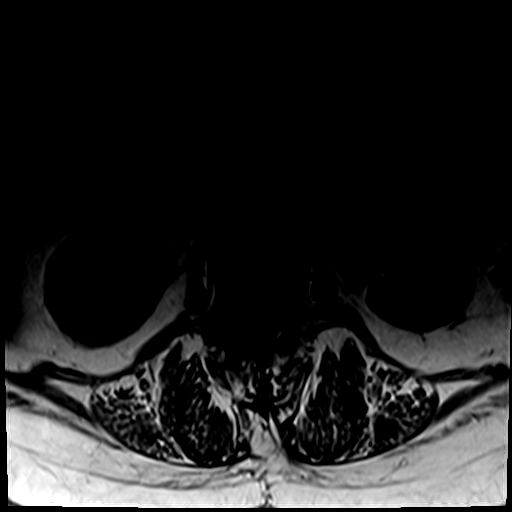
[im 28/34]
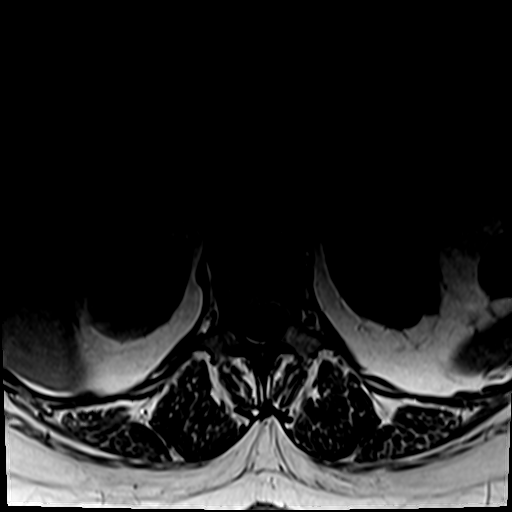
[im 34/34]
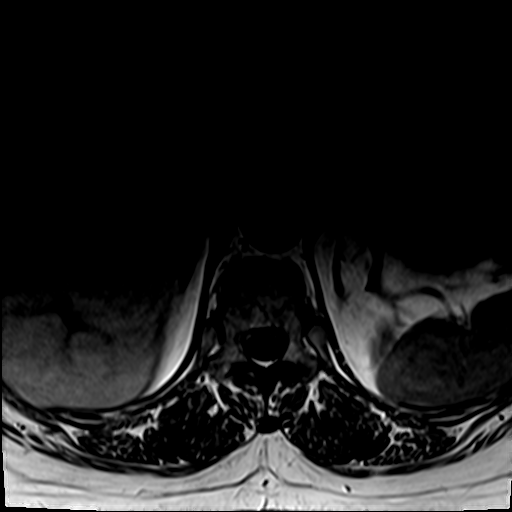

[31 of 48 positions shown; findings below may reference images not displayed]

FINDINGS: Segmentation:  Normal

Alignment: Mild retrolisthesis L1-2 and L2-3. Mild anterolisthesis
L4-5 and L5-S1.

Vertebrae:  PLIF L4-5.  Negative for fracture or mass.

Conus medullaris and cauda equina: Conus extends to the L1-2 level.
Conus and cauda equina appear normal.

Paraspinal and other soft tissues: Negative for paraspinous mass or
adenopathy. No soft tissue edema.

Disc levels:

T10-11: Advanced disc degeneration and spurring causing extensive
foraminal encroachment bilaterally

T11-12: Moderate disc degeneration and spurring. Moderate foraminal
narrowing bilaterally.

T12-L1: Bilateral facet degeneration.  Negative for stenosis

L1-2: Mild disc bulging and mild facet degeneration. Negative for
stenosis

L2-3: Disc bulging. Moderate to advanced facet and ligamentum flavum
hypertrophy with moderate spinal stenosis. Neural foramina patent
bilaterally

L3-4: Severe facet degeneration bilaterally. Diffuse bulging of the
disc. Posterior decompression. Moderate spinal stenosis. Severe
subarticular stenosis bilaterally.

L4-5: PLIF. Posterior decompression. No significant spinal stenosis.
Mild left foraminal narrowing due to spurring

L5-S1: Posterior decompression.  Negative for stenosis.
IMPRESSION: Advanced disc degeneration and spurring T10-11 with severe foraminal
encroachment bilaterally. Moderate foraminal encroachment
bilaterally T11-12 due to spurring

Moderate to severe spinal stenosis at L2-3 due to advanced facet
degeneration

Moderate spinal stenosis L3-4 with severe subarticular stenosis
bilaterally.

Posterior decompression and fusion at L4-5. Mild left foraminal
narrowing due to spurring

Posterior decompression L5-S1 without significant stenosis.

## 2019-08-23 ENCOUNTER — Encounter: Payer: Self-pay | Admitting: Nurse Practitioner

## 2019-08-23 ENCOUNTER — Telehealth (INDEPENDENT_AMBULATORY_CARE_PROVIDER_SITE_OTHER): Payer: Medicare Other | Admitting: Nurse Practitioner

## 2019-08-23 ENCOUNTER — Ambulatory Visit: Payer: Medicare Other | Admitting: Nurse Practitioner

## 2019-08-23 DIAGNOSIS — N183 Chronic kidney disease, stage 3 unspecified: Secondary | ICD-10-CM

## 2019-08-23 DIAGNOSIS — G4733 Obstructive sleep apnea (adult) (pediatric): Secondary | ICD-10-CM

## 2019-08-23 DIAGNOSIS — D508 Other iron deficiency anemias: Secondary | ICD-10-CM | POA: Diagnosis not present

## 2019-08-23 DIAGNOSIS — E1169 Type 2 diabetes mellitus with other specified complication: Secondary | ICD-10-CM

## 2019-08-23 DIAGNOSIS — E1149 Type 2 diabetes mellitus with other diabetic neurological complication: Secondary | ICD-10-CM

## 2019-08-23 DIAGNOSIS — I10 Essential (primary) hypertension: Secondary | ICD-10-CM

## 2019-08-23 DIAGNOSIS — E1159 Type 2 diabetes mellitus with other circulatory complications: Secondary | ICD-10-CM

## 2019-08-23 DIAGNOSIS — E538 Deficiency of other specified B group vitamins: Secondary | ICD-10-CM

## 2019-08-23 DIAGNOSIS — E1122 Type 2 diabetes mellitus with diabetic chronic kidney disease: Secondary | ICD-10-CM

## 2019-08-23 DIAGNOSIS — E785 Hyperlipidemia, unspecified: Secondary | ICD-10-CM

## 2019-08-23 DIAGNOSIS — I152 Hypertension secondary to endocrine disorders: Secondary | ICD-10-CM

## 2019-08-23 NOTE — Assessment & Plan Note (Signed)
Chronic, ongoing.  Continue current medication regimen and adjust as needed.  Lipid panel outpatient. ?

## 2019-08-23 NOTE — Assessment & Plan Note (Signed)
Chronic, ongoing, not checking BP at home as recommended. Continue to recommend checking BP at home a few mornings a week and documenting.  Continue current medication regimen and adjust as needed.  CMP outpatient. Would consider addition of ACE or ARB with d/c of HCTZ at next visit, will discuss with patient as unsure she has tried these before.

## 2019-08-23 NOTE — Assessment & Plan Note (Signed)
Chronic, ongoing with recent A1C 6.8%.  Continue Metformin 500 MG twice a day and Gabapentin for neuropathy + continuing to monitor blood sugar daily.  Plan to obtain outpatient labs and will adjust medication dose as needed. Return in 3 months.

## 2019-08-23 NOTE — Assessment & Plan Note (Signed)
Sleep study performed, could not afford to return to clinic or obtain equipment.  Will continue to work with CCM team on this. 

## 2019-08-23 NOTE — Assessment & Plan Note (Signed)
Chronic, ongoing.  Continue current medication regimen and collaboration with hematology. Obtain outpatient labs, recently ordered by Dr. Tasia Catchings.

## 2019-08-23 NOTE — Patient Instructions (Signed)
Carbohydrate Counting for Diabetes Mellitus, Adult  Carbohydrate counting is a method of keeping track of how many carbohydrates you eat. Eating carbohydrates naturally increases the amount of sugar (glucose) in the blood. Counting how many carbohydrates you eat helps keep your blood glucose within normal limits, which helps you manage your diabetes (diabetes mellitus). It is important to know how many carbohydrates you can safely have in each meal. This is different for every person. A diet and nutrition specialist (registered dietitian) can help you make a meal plan and calculate how many carbohydrates you should have at each meal and snack. Carbohydrates are found in the following foods:  Grains, such as breads and cereals.  Dried beans and soy products.  Starchy vegetables, such as potatoes, peas, and corn.  Fruit and fruit juices.  Milk and yogurt.  Sweets and snack foods, such as cake, cookies, candy, chips, and soft drinks. How do I count carbohydrates? There are two ways to count carbohydrates in food. You can use either of the methods or a combination of both. Reading "Nutrition Facts" on packaged food The "Nutrition Facts" list is included on the labels of almost all packaged foods and beverages in the [place] It includes:  The serving size.  Information about nutrients in each serving, including the grams (g) of carbohydrate per serving. To use the "Nutrition Facts":  Decide how many servings you will have.  Multiply the number of servings by the number of carbohydrates per serving.  The resulting number is the total amount of carbohydrates that you will be having. Learning standard serving sizes of other foods When you eat carbohydrate foods that are not packaged or do not include "Nutrition Facts" on the label, you need to measure the servings in order to count the amount of carbohydrates:  Measure the foods that you will eat with a food scale or measuring cup, if  needed.  Decide how many standard-size servings you will eat.  Multiply the number of servings by 15. Most carbohydrate-rich foods have about 15 g of carbohydrates per serving. ? For example, if you eat 8 oz (170 g) of strawberries, you will have eaten 2 servings and 30 g of carbohydrates (2 servings x 15 g = 30 g).  For foods that have more than one food mixed, such as soups and casseroles, you must count the carbohydrates in each food that is included. The following list contains standard serving sizes of common carbohydrate-rich foods. Each of these servings has about 15 g of carbohydrates:   hamburger bun or  English muffin.   oz (15 mL) syrup.   oz (14 g) jelly.  1 slice of bread.  1 six-inch tortilla.  3 oz (85 g) cooked rice or pasta.  4 oz (113 g) cooked dried beans.  4 oz (113 g) starchy vegetable, such as peas, corn, or potatoes.  4 oz (113 g) hot cereal.  4 oz (113 g) mashed potatoes or  of a large baked potato.  4 oz (113 g) canned or frozen fruit.  4 oz (120 mL) fruit juice.  4-6 crackers.  6 chicken nuggets.  6 oz (170 g) unsweetened dry cereal.  6 oz (170 g) plain fat-free yogurt or yogurt sweetened with artificial sweeteners.  8 oz (240 mL) milk.  8 oz (170 g) fresh fruit or one small piece of fruit.  24 oz (680 g) popped popcorn. Example of carbohydrate counting Sample meal  3 oz (85 g) chicken breast.  6 oz (170 g)   brown rice.  4 oz (113 g) corn.  8 oz (240 mL) milk.  8 oz (170 g) strawberries with sugar-free whipped topping. Carbohydrate calculation 1. Identify the foods that contain carbohydrates: ? Rice. ? Corn. ? Milk. ? Strawberries. 2. Calculate how many servings you have of each food: ? 2 servings rice. ? 1 serving corn. ? 1 serving milk. ? 1 serving strawberries. 3. Multiply each number of servings by 15 g: ? 2 servings rice x 15 g = 30 g. ? 1 serving corn x 15 g = 15 g. ? 1 serving milk x 15 g = 15 g. ? 1  serving strawberries x 15 g = 15 g. 4. Add together all of the amounts to find the total grams of carbohydrates eaten: ? 30 g + 15 g + 15 g + 15 g = 75 g of carbohydrates total. Summary  Carbohydrate counting is a method of keeping track of how many carbohydrates you eat.  Eating carbohydrates naturally increases the amount of sugar (glucose) in the blood.  Counting how many carbohydrates you eat helps keep your blood glucose within normal limits, which helps you manage your diabetes.  A diet and nutrition specialist (registered dietitian) can help you make a meal plan and calculate how many carbohydrates you should have at each meal and snack. This information is not intended to replace advice given to you by your health care provider. Make sure you discuss any questions you have with your health care provider. Document Revised: 01/01/2017 Document Reviewed: 11/21/2015 Elsevier Patient Education  2020 Elsevier Inc.  

## 2019-08-23 NOTE — Assessment & Plan Note (Signed)
Continue monthly injections and obtain outpatient B12 level. 

## 2019-08-23 NOTE — Assessment & Plan Note (Signed)
Chronic, ongoing with last A1C 6.8% and GFR 55.  Continue current medication regimen and renal dose Metformin in future if decline noted.  Recommend continue to check BS at home consistently and document.  Obtain outpatient labs and return in 3 months.

## 2019-08-23 NOTE — Progress Notes (Signed)
LMP  (LMP Unknown)    Subjective:    Patient ID: Tina Mcdonald, female    DOB: Dec 25, 1940, 79 y.o.   MRN: [id number]  HPI: Tina Mcdonald is a 79 y.o. female  Chief Complaint  Patient presents with  . Diabetes    Dr. Gloriann Loan for eye exam   . Hyperlipidemia  . Hypertension    . This visit was completed via MyChart due to the restrictions of the COVID-19 pandemic. All issues as above were discussed and addressed. Physical exam was done as above through visual confirmation on MyChart. If it was felt that the patient should be evaluated in the office, they were directed there. The patient verbally consented to this visit. . Location of the patient: home . Location of the provider: work . Those involved with this call:  . Provider: Marnee Guarneri, DNP . CMA: Yvonna Alanis, CMA . Front Desk/Registration: Don Perking  . Time spent on call: 15 minutes with patient face to face via video conference. More than 50% of this time was spent in counseling and coordination of care. 10 minutes total spent in review of patient's record and preparation of their chart.  . I verified patient identity using two factors (patient name and date of birth). Patient consents verbally to being seen via telemedicine visit today.    DIABETES Continues on Metformin and Gabapentin. Last A1C 6.8%.  Continues on B12 and Slow Fe for anemia, last saw Dr. Tasia Catchings in June 14, 2019. She has seen GI and colonoscopy and EGD showed benign findings on review. Does have a hiatal hernia.  Has underling OSA -- but could not afford cost of return to sleep study location or equipment.    Hypoglycemic episodes:no Polydipsia/polyuria:no Visual disturbance:no Chest pain:no Paresthesias:no Glucose Monitoring:yes Accucheck frequency: every other day Fasting glucose: on average 108 to 120, highest 140 Post prandial: Evening: Before meals: Taking  Insulin?:no Long acting insulin: Short acting insulin: Blood Pressure Monitoring:not checking Retinal Examination:Up To Date -- end of 2020 (Dr. Bell, [place]) Foot Exam:Up to Date Pneumovax:Up to Date Influenza:Up to Date Aspirin:no  HYPERTENSION / HYPERLIPIDEMIA Continues on HCTZ and Clonidine + Lipitor.  Last saw Dr. Clayborn Bigness 03/22/2019.  Has been holding her Lipitor due to currently taking fungal medication, Diflucan for nails. Satisfied with current treatment?yes Duration of hypertension:chronic BP monitoring frequency:not checking BP range:  BP medication side effects:no Duration of hyperlipidemia:chronic Cholesterol medication side effects:no Cholesterol supplements: none Medication compliance:good compliance Aspirin:no Recent stressors:no Recurrent headaches:no Visual changes:no Palpitations:no Dyspnea:no Chest pain:no Lower extremity edema:no Dizzy/lightheaded:no  Relevant past medical, surgical, family and social history reviewed and updated as indicated. Interim medical history since our last visit reviewed. Allergies and medications reviewed and updated.  Review of Systems  Constitutional: Negative for activity change, appetite change, diaphoresis, fatigue and fever.  Respiratory: Negative for cough, chest tightness and shortness of breath.   Cardiovascular: Negative for chest pain, palpitations and leg swelling.  Gastrointestinal: Negative.   Endocrine: Negative for cold intolerance, heat intolerance, polydipsia, polyphagia and polyuria.  Neurological: Negative.   Psychiatric/Behavioral: Negative.     Per HPI unless specifically indicated above     Objective:    LMP  (LMP Unknown)   Wt Readings from Last 3 Encounters:  04/19/19 167 lb (75.8 kg)  04/19/19 167 lb 8 oz (76 kg)  03/30/19 169 lb (76.7 kg)    Physical Exam Vitals and nursing note reviewed.  Constitutional:      General: She is  awake. She is not in acute distress.    Appearance: She is well-developed. She is not ill-appearing.  HENT:     Head: Normocephalic.     Right Ear: Hearing normal.     Left Ear: Hearing normal.  Eyes:     General: Lids are normal.        Right eye: No discharge.        Left eye: No discharge.     Conjunctiva/sclera: Conjunctivae normal.  Pulmonary:     Effort: Pulmonary effort is normal. No accessory muscle usage or respiratory distress.  Musculoskeletal:     Cervical back: Normal range of motion.  Neurological:     Mental Status: She is alert and oriented to person, place, and time.  Psychiatric:        Attention and Perception: Attention normal.        Mood and Affect: Mood normal.        Behavior: Behavior normal. Behavior is cooperative.        Thought Content: Thought content normal.        Judgment: Judgment normal.     Results for orders placed or performed in visit on 06/07/19  Ferritin  Result Value Ref Range   Ferritin 53 11 - 307 ng/mL  Iron and TIBC  Result Value Ref Range   Iron 55 28 - 170 ug/dL   TIBC 328 250 - 450 ug/dL   Saturation Ratios 17 10.4 - 31.8 %   UIBC 273 ug/dL  CBC with Differential/Platelet  Result Value Ref Range   WBC 4.8 4.0 - 10.5 K/uL   RBC 4.34 3.87 - 5.11 MIL/uL   Hemoglobin 10.2 (L) 12.0 - 15.0 g/dL   HCT 34.8 (L) 36.0 - 46.0 %   MCV 80.2 80.0 - 100.0 fL   MCH 23.5 (L) 26.0 - 34.0 pg   MCHC 29.3 (L) 30.0 - 36.0 g/dL   RDW 15.7 (H) 11.5 - 15.5 %   Platelets 248 150 - 400 K/uL   nRBC 0.0 0.0 - 0.2 %   Neutrophils Relative % 55 %   Neutro Abs 2.7 1.7 - 7.7 K/uL   Lymphocytes Relative 31 %   Lymphs Abs 1.5 0.7 - 4.0 K/uL   Monocytes Relative 10 %   Monocytes Absolute 0.5 0.1 - 1.0 K/uL   Eosinophils Relative 4 %   Eosinophils Absolute 0.2 0.0 - 0.5 K/uL   Basophils Relative 0 %   Basophils Absolute 0.0 0.0 - 0.1 K/uL   Immature Granulocytes 0 %   Abs Immature Granulocytes 0.01 0.00 - 0.07 K/uL  Alpha-Thalassemia GenotypR   Result Value Ref Range   Alpha-Thalassemia Comment:       Assessment & Plan:   Problem List Items Addressed This Visit      Cardiovascular and Mediastinum   Hypertension associated with diabetes ([place])    Chronic, ongoing, not checking BP at home as recommended. Continue to recommend checking BP at home a few mornings a week and documenting.  Continue current medication regimen and adjust as needed.  CMP outpatient. Would consider addition of ACE or ARB with d/c of HCTZ at next visit, will discuss with patient as unsure she has tried these before.      Relevant Orders   Comprehensive metabolic panel   Bayer DCA Hb A1c Waived   Microalbumin, Urine Waived     Respiratory   Sleep apnea    Sleep study performed, could not afford to return to clinic or obtain  equipment.  Will continue to work with CCM team on this.        Endocrine   Hyperlipidemia associated with type 2 diabetes mellitus (HCC)    Chronic, ongoing.  Continue current medication regimen and adjust as needed.  Lipid panel outpatient.      Relevant Orders   Bayer DCA Hb A1c Waived   Lipid Panel [place], Waived   Diabetic neuropathy ([place]) - Primary    Chronic, ongoing with recent A1C 6.8%.  Continue Metformin 500 MG twice a day and Gabapentin for neuropathy + continuing to monitor blood sugar daily.  Plan to obtain outpatient labs and will adjust medication dose as needed. Return in 3 months.      Relevant Orders   Bayer DCA Hb A1c Waived   CKD stage 3 due to type 2 diabetes mellitus (HCC)    Chronic, ongoing with last A1C 6.8% and GFR 55.  Continue current medication regimen and renal dose Metformin in future if decline noted.  Recommend continue to check BS at home consistently and document.  Obtain outpatient labs and return in 3 months.        Other   Iron deficiency anemia    Chronic, ongoing.  Continue current medication regimen and collaboration with hematology. Obtain outpatient labs, recently ordered by  Dr. Tasia Catchings.      B12 deficiency    Continue monthly injections and obtain outpatient B12 level.         I discussed the assessment and treatment plan with the patient. The patient was provided an opportunity to ask questions and all were answered. The patient agreed with the plan and demonstrated an understanding of the instructions.   The patient was advised to call back or seek an in-person evaluation if the symptoms worsen or if the condition fails to improve as anticipated.   I provided 15+ minutes of time during this encounter.  Follow up plan: Return in about 3 months (around 11/23/2019) for T2DM, HTN/HLD.

## 2019-08-29 ENCOUNTER — Other Ambulatory Visit: Payer: Self-pay | Admitting: Nurse Practitioner

## 2019-08-29 NOTE — Telephone Encounter (Signed)
Requested medication (s) are due for refill today: yes  Requested medication (s) are on the active medication list: yes  Last refill:  06/23/19  Future visit scheduled:yes  Notes to clinic: Off protocol    Requested Prescriptions  Pending Prescriptions Disp Refills   ciclopirox (PENLAC) 8 % solution 6.6 mL 1    Sig: Apply topically at bedtime. Apply over nail and surrounding skin. Apply daily over previous coat. After seven (7) days, may remove with alcohol and continue cycle.      Off-Protocol Failed - 08/29/2019  4:33 PM      Failed - Medication not assigned to a protocol, review manually.      Passed - Valid encounter within last 12 months    Recent Outpatient Visits           6 days ago Other diabetic neurological complication associated with type 2 diabetes mellitus ([place])   [place], Jolene T, NP   3 months ago Type 2 diabetes mellitus with stage 3a chronic kidney disease, without long-term current use of insulin ([place])   [place], Olivia T, NP   4 months ago Nail fungus   [place], [place] T, NP   5 months ago B12 deficiency   [facility] [place], [place] T, NP   6 months ago Uncontrolled type 2 diabetes mellitus with chronic kidney disease ([place])   [employer], [name], NP       Future Appointments             In 3 months [name], [name], NP [facility]

## 2019-08-29 NOTE — Telephone Encounter (Signed)
Pt called in to request a refill for ciclopirox [facility]) 8 % solution   Pharmacy:  [facility], [place] Phone:  [phone]  Fax:  [phone]      Please assist.

## 2019-08-31 NOTE — Telephone Encounter (Addendum)
Pt called back in to follow up on request for Rx. Pt says that she would like to pick up from pharmacy.   Please assist.

## 2019-08-31 NOTE — Telephone Encounter (Signed)
Routing to provider  

## 2019-09-01 ENCOUNTER — Telehealth: Payer: Self-pay | Admitting: Nurse Practitioner

## 2019-09-01 ENCOUNTER — Ambulatory Visit (INDEPENDENT_AMBULATORY_CARE_PROVIDER_SITE_OTHER): Payer: Medicare Other

## 2019-09-01 ENCOUNTER — Other Ambulatory Visit: Payer: Self-pay

## 2019-09-01 ENCOUNTER — Other Ambulatory Visit: Payer: Medicare Other

## 2019-09-01 DIAGNOSIS — E1159 Type 2 diabetes mellitus with other circulatory complications: Secondary | ICD-10-CM | POA: Diagnosis not present

## 2019-09-01 DIAGNOSIS — E785 Hyperlipidemia, unspecified: Secondary | ICD-10-CM | POA: Diagnosis not present

## 2019-09-01 DIAGNOSIS — E1169 Type 2 diabetes mellitus with other specified complication: Secondary | ICD-10-CM

## 2019-09-01 DIAGNOSIS — E538 Deficiency of other specified B group vitamins: Secondary | ICD-10-CM | POA: Diagnosis not present

## 2019-09-01 DIAGNOSIS — I10 Essential (primary) hypertension: Secondary | ICD-10-CM | POA: Diagnosis not present

## 2019-09-01 DIAGNOSIS — I152 Hypertension secondary to endocrine disorders: Secondary | ICD-10-CM

## 2019-09-01 DIAGNOSIS — E1149 Type 2 diabetes mellitus with other diabetic neurological complication: Secondary | ICD-10-CM

## 2019-09-01 LAB — LIPID PANEL PICCOLO, WAIVED
Chol/HDL Ratio Piccolo,Waive: 4.3 mg/dL
Cholesterol Piccolo, Waived: 223 mg/dL — ABNORMAL HIGH (ref <200)
HDL Chol Piccolo, Waived: 52 mg/dL — ABNORMAL LOW (ref >59)
LDL Chol Calc Piccolo Waived: 138 mg/dL — ABNORMAL HIGH (ref <100)
Triglycerides Piccolo,Waived: 161 mg/dL — ABNORMAL HIGH (ref <150)
VLDL Chol Calc Piccolo,Waive: 32 mg/dL — ABNORMAL HIGH (ref <30)

## 2019-09-01 LAB — MICROALBUMIN, URINE WAIVED
Creatinine, Urine Waived: 200 mg/dL (ref 10–300)
Microalb, Ur Waived: 30 mg/L — ABNORMAL HIGH (ref 0–19)
Microalb/Creat Ratio: <30 mg/g (ref <30)

## 2019-09-01 LAB — BAYER DCA HB A1C WAIVED: HB A1C (BAYER DCA - WAIVED): 6.5 % (ref <7.0)

## 2019-09-01 MED ORDER — CICLOPIROX 8 % EX SOLN: Freq: Every day | CUTANEOUS | 1 refills | Status: DC

## 2019-09-01 NOTE — Telephone Encounter (Signed)
Refill sent.

## 2019-09-01 NOTE — Telephone Encounter (Signed)
Patient states that she needs a refill on Ciclopirox 8%,she states that a bottle is not lasting a month. She is currently out of medication.

## 2019-09-02 ENCOUNTER — Other Ambulatory Visit: Payer: Self-pay | Admitting: Nurse Practitioner

## 2019-09-02 LAB — COMPREHENSIVE METABOLIC PANEL
ALT: 15 IU/L (ref 0–32)
AST: 16 IU/L (ref 0–40)
Albumin/Globulin Ratio: 2 (ref 1.2–2.2)
Albumin: 4.3 g/dL (ref 3.7–4.7)
Alkaline Phosphatase: 71 IU/L (ref 39–117)
BUN/Creatinine Ratio: 16 (ref 12–28)
BUN: 17 mg/dL (ref 8–27)
Bilirubin Total: <0.2 mg/dL (ref 0.0–1.2)
CO2: 22 mmol/L (ref 20–29)
Calcium: 9.5 mg/dL (ref 8.7–10.3)
Chloride: 106 mmol/L (ref 96–106)
Creatinine, Ser: 1.06 mg/dL — ABNORMAL HIGH (ref 0.57–1.00)
GFR calc Af Amer: 59 mL/min/{1.73_m2} — ABNORMAL LOW (ref >59)
GFR calc non Af Amer: 51 mL/min/{1.73_m2} — ABNORMAL LOW (ref >59)
Globulin, Total: 2.2 g/dL (ref 1.5–4.5)
Glucose: 137 mg/dL — ABNORMAL HIGH (ref 65–99)
Potassium: 4 mmol/L (ref 3.5–5.2)
Sodium: 145 mmol/L — ABNORMAL HIGH (ref 134–144)
Total Protein: 6.5 g/dL (ref 6.0–8.5)

## 2019-09-02 NOTE — Telephone Encounter (Signed)
Requested Prescriptions  Pending Prescriptions Disp Refills  . hydrochlorothiazide (HYDRODIURIL) 25 MG tablet [Pharmacy Med Name: hydroCHLOROthiazide 25 MG Oral Tablet] 90 tablet 0    Sig: Take 1 tablet by mouth once daily     Cardiovascular: Diuretics - Thiazide Failed - 09/02/2019 11:31 AM      Failed - Cr in normal range and within 360 days    Creatinine, Ser  Date Value Ref Range Status  09/01/2019 1.06 (H) 0.57 - 1.00 mg/dL Final         Failed - Na in normal range and within 360 days    Sodium  Date Value Ref Range Status  09/01/2019 145 (H) 134 - 144 mmol/L Final         Failed - Last BP in normal range    BP Readings from Last 1 Encounters:  04/19/19 (!) 155/90         Passed - Ca in normal range and within 360 days    Calcium  Date Value Ref Range Status  09/01/2019 9.5 8.7 - 10.3 mg/dL Final   Calcium, Ion  Date Value Ref Range Status  11/25/2013 1.24 1.13 - 1.30 mmol/L Final         Passed - K in normal range and within 360 days    Potassium  Date Value Ref Range Status  09/01/2019 4.0 3.5 - 5.2 mmol/L Final         Passed - Valid encounter within last 6 months    Recent Outpatient Visits          1 week ago Other diabetic neurological complication associated with type 2 diabetes mellitus ([place])   [place], Jolene T, NP   3 months ago Type 2 diabetes mellitus with stage 3a chronic kidney disease, without long-term current use of insulin ([place])   [place], Severy T, NP   4 months ago Nail fungus   [place], [place] T, NP   5 months ago B12 deficiency   [place], [place] T, NP   6 months ago Uncontrolled type 2 diabetes mellitus with chronic kidney disease ([place])   [place], [name], NP      Future Appointments            In 3 months [name], [name], NP [facility]

## 2019-09-05 ENCOUNTER — Telehealth: Payer: Self-pay | Admitting: Nurse Practitioner

## 2019-09-05 DIAGNOSIS — E785 Hyperlipidemia, unspecified: Secondary | ICD-10-CM

## 2019-09-05 DIAGNOSIS — E1169 Type 2 diabetes mellitus with other specified complication: Secondary | ICD-10-CM

## 2019-09-05 DIAGNOSIS — E870 Hyperosmolality and hypernatremia: Secondary | ICD-10-CM

## 2019-09-05 NOTE — Telephone Encounter (Signed)
Reviewed labs with patient.  NA+ 145, increase water intake.  A1C remains in goal range.  Cholesterol levels elevated, even with increase in statin dose. She was not fasting, will repeat with fasting.  Is not on ACE or ARB, has taken in past per her report and is not sure why discontinued.  May benefit from restart of this in future.

## 2019-09-06 NOTE — Telephone Encounter (Signed)
Scheduled pt for 09/19/19

## 2019-09-07 ENCOUNTER — Ambulatory Visit (INDEPENDENT_AMBULATORY_CARE_PROVIDER_SITE_OTHER): Payer: Medicare Other | Admitting: Pharmacist

## 2019-09-07 ENCOUNTER — Other Ambulatory Visit: Payer: Self-pay | Admitting: Nurse Practitioner

## 2019-09-07 DIAGNOSIS — E1122 Type 2 diabetes mellitus with diabetic chronic kidney disease: Secondary | ICD-10-CM

## 2019-09-07 DIAGNOSIS — E1159 Type 2 diabetes mellitus with other circulatory complications: Secondary | ICD-10-CM

## 2019-09-07 DIAGNOSIS — N1831 Chronic kidney disease, stage 3a: Secondary | ICD-10-CM | POA: Diagnosis not present

## 2019-09-07 DIAGNOSIS — E1121 Type 2 diabetes mellitus with diabetic nephropathy: Secondary | ICD-10-CM

## 2019-09-07 DIAGNOSIS — I10 Essential (primary) hypertension: Secondary | ICD-10-CM

## 2019-09-07 NOTE — Chronic Care Management (AMB) (Signed)
Chronic Care Management   Follow Up Note   09/07/2019 Name: Tina Mcdonald MRN: [id number] DOB: 1941/06/15  Referred by: [name], NP Reason for referral : Chronic Care Management (Medication Management)   Tina Mcdonald is a 79 y.o. year old female who is a primary care patient of [name], [name], NP. The CCM team was consulted for assistance with chronic disease management and care coordination needs.    Contacted patient for medication management review.   Review of patient status, including review of consultants reports, relevant laboratory and other test results, and collaboration with appropriate care team members and the patient's provider was performed as part of comprehensive patient evaluation and provision of chronic care management services.    SDOH (Social Determinants of Health) assessments performed: Yes See Care Plan activities for detailed interventions related to [facility])     Outpatient Encounter Medications as of 09/07/2019  Medication Sig Note  . atorvastatin (LIPITOR) 40 MG tablet Take 1 tablet (40 mg total) by mouth daily.   . blood glucose meter kit and supplies KIT Dispense based on patient and insurance preference. Use up to four times daily as directed. (FOR ICD-9 250.00, 250.01).   . cloNIDine (CATAPRES) 0.1 MG tablet Take 0.1 mg by mouth 2 (two) times daily.   . diclofenac sodium (VOLTAREN) 1 % GEL Apply 2 g topically 2 (two) times daily as needed.   . Ferrous Sulfate (SLOW FE PO) Take 1 tablet by mouth daily.   [name] gabapentin (NEURONTIN) 100 MG capsule TAKE 3 CAPSULES BY MOUTH AT BEDTIME (Patient taking differently: Take 2 tablets daily at bedtime)   . hydrochlorothiazide (HYDRODIURIL) 25 MG tablet Take 1 tablet by mouth once daily   . metFORMIN (GLUCOPHAGE-XR) 500 MG 24 hr tablet Take 1 tablet (500 mg total) by mouth 2 (two) times daily.   . metoprolol succinate (TOPROL-XL) 50 MG 24 hr tablet Take 1 tablet by mouth once daily   . omeprazole  (PRILOSEC) 20 MG capsule Take 1 capsule by mouth once daily   . vitamin C (ASCORBIC ACID) 250 MG tablet Take 2 tablets (500 mg total) by mouth daily.   [name] acetaminophen (TYLENOL) 325 MG tablet Take 650 mg by mouth every 6 (six) hours as needed. 06/03/2019: Taking 6 650 mg daily = 3900 mg daily   . ciclopirox (PENLAC) 8 % solution Apply topically at bedtime. Apply over nail and surrounding skin. Apply daily over previous coat. After seven (7) days, may remove with alcohol and continue cycle.   . fluconazole (DIFLUCAN) 200 MG tablet Take 200 mg by mouth once a week.   [name] glucose blood test strip Check daily. ICD 10 E11.9    Facility-Administered Encounter Medications as of 09/07/2019  Medication  . cyanocobalamin ((VITAMIN B-12)) injection 1,000 mcg     Objective:   Goals Addressed            This Visit's Progress     Patient Stated   . PharmD "I want to take care of my medications" (pt-stated)       CARE PLAN ENTRY (see longtitudinal plan of care for additional care plan information)  Current Barriers:  . Polypharmacy; complex patient with multiple comorbidities including T2DM, HTN, anemia, CKD, OA, GERD . Self-manages medications.  o Anemia: slow FE + Vitamin C daily o T2DM; controlled, last A1c 6.5%; metformin 500 mg BID; o ASCVD risk reduction:  - HTN: HCTZ 25 mg daily, clonidine 0.1 mg BID, metoprolol succinate 50 mg daily;  -  HLD: atorvastatin 40 mg daily; last LDL not at goal <100, however, she had been holding atorvastatin during concurrent fluconazole therapy o GERD: omeprazole 20 mg daily  o OA: diclofenac gel PRN to knees. Acetaminophen 650 mg up to 6 tabs daily PRN o Nail fungus: Notes this is continuing to improve o OSA: notes that she was unable to afford repeat sleep study testing and CPAP supplies. Did the initial testing, but wasn't able to afford confirmatory testing or supplies. Notes that she has a new [employer] this year and thinks they may cover  more.   Pharmacist Clinical Goal(s):  [name] Over the next 90 days, patient will work with PharmD and provider towards optimized medication management  Interventions: . Comprehensive medication review performed; medication list updated in electronic medical record . Placed C3 Care Guide referral and will collaborate w/ RN CM for support regarding sleep study cost and CPAP supply cost.  . Encouraged to continue to communicate w/ dermatology about duration of fluconazole therapy.   Patient Self Care Activities:  . Patient will take medications as prescribed  Please see past updates related to this goal by clicking on the "Past Updates" button in the selected goal          Plan:  - Scheduled f/u call 11/02/19  Catie Darnelle Maffucci, PharmD, [place] [phone]

## 2019-09-07 NOTE — Patient Instructions (Signed)
Visit Information  Goals Addressed            This Visit's Progress     Patient Stated   . PharmD "I want to take care of my medications" (pt-stated)       CARE PLAN ENTRY (see longtitudinal plan of care for additional care plan information)  Current Barriers:  . Polypharmacy; complex patient with multiple comorbidities including T2DM, HTN, anemia, CKD, OA, GERD . Self-manages medications.  o Anemia: slow FE + Vitamin C daily o T2DM; controlled, last A1c 6.5%; metformin 500 mg BID; o ASCVD risk reduction:  - HTN: HCTZ 25 mg daily, clonidine 0.1 mg BID, metoprolol succinate 50 mg daily;  - HLD: atorvastatin 40 mg daily; last LDL not at goal <100, however, she had been holding atorvastatin during concurrent fluconazole therapy o GERD: omeprazole 20 mg daily  o OA: diclofenac gel PRN to knees. Acetaminophen 650 mg up to 6 tabs daily PRN o Nail fungus: Notes this is continuing to improve o OSA: notes that she was unable to afford repeat sleep study testing and CPAP supplies. Did the initial testing, but wasn't able to afford confirmatory testing or supplies. Notes that she has a new [employer] this year and thinks they may cover more.   Pharmacist Clinical Goal(s):  [name] Over the next 90 days, patient will work with PharmD and provider towards optimized medication management  Interventions: . Comprehensive medication review performed; medication list updated in electronic medical record . Placed C3 Care Guide referral and will collaborate w/ RN CM for support regarding sleep study cost and CPAP supply cost.  . Encouraged to continue to communicate w/ dermatology about duration of fluconazole therapy.   Patient Self Care Activities:  . Patient will take medications as prescribed  Please see past updates related to this goal by clicking on the "Past Updates" button in the selected goal         Patient verbalizes understanding of instructions provided today.   Plan:   - Scheduled f/u call 11/02/19  Catie Darnelle Maffucci, PharmD, [place] [phone]

## 2019-09-07 NOTE — Telephone Encounter (Signed)
Requested medication (s) are due for refill today: yes  Requested medication (s) are on the active medication list: yes  Last refill:  06/06/2019  Future visit scheduled: yes  Notes to clinic:  Cardiovascular: Beta Blockers failed   Requested Prescriptions  Pending Prescriptions Disp Refills   metoprolol succinate (TOPROL-XL) 50 MG 24 hr tablet [Pharmacy Med Name: Metoprolol Succinate ER 50 MG Oral Tablet Extended Release 24 Hour] 90 tablet 0    Sig: Take 1 tablet by mouth once daily      Cardiovascular:  Beta Blockers Failed - 09/07/2019  3:25 PM      Failed - Last BP in normal range    BP Readings from Last 1 Encounters:  04/19/19 (!) 155/90          Passed - Last Heart Rate in normal range    Pulse Readings from Last 1 Encounters:  04/19/19 92          Passed - Valid encounter within last 6 months    Recent Outpatient Visits           2 weeks ago Other diabetic neurological complication associated with type 2 diabetes mellitus ([place])   [place], Jolene T, NP   3 months ago Type 2 diabetes mellitus with stage 3a chronic kidney disease, without long-term current use of insulin ([place])   [place], [place] T, NP   4 months ago Nail fungus   [place], [place] T, NP   5 months ago B12 deficiency   [place], Isabel T, NP   7 months ago Uncontrolled type 2 diabetes mellitus with chronic kidney disease ([place])   [place], [name], NP       Future Appointments             In 2 months [name], [name], NP [employer]

## 2019-09-07 NOTE — Telephone Encounter (Signed)
Last visit was 08/23/19 and next is 12/02/19

## 2019-09-19 ENCOUNTER — Other Ambulatory Visit: Payer: Self-pay

## 2019-09-19 ENCOUNTER — Other Ambulatory Visit: Payer: Medicare Other

## 2019-09-19 ENCOUNTER — Telehealth: Payer: Self-pay | Admitting: Nurse Practitioner

## 2019-09-19 DIAGNOSIS — E1169 Type 2 diabetes mellitus with other specified complication: Secondary | ICD-10-CM

## 2019-09-19 DIAGNOSIS — E870 Hyperosmolality and hypernatremia: Secondary | ICD-10-CM

## 2019-09-19 DIAGNOSIS — E785 Hyperlipidemia, unspecified: Secondary | ICD-10-CM | POA: Diagnosis not present

## 2019-09-19 NOTE — Telephone Encounter (Signed)
Pt brought in disability parking place paper and would like a phone call when it is signed. I put the paper in the back basket to be signed.

## 2019-09-19 NOTE — Telephone Encounter (Signed)
Form has been signed and placed in basket.

## 2019-09-19 NOTE — Telephone Encounter (Signed)
I have form.  Form filled out and placed in provider's folder to sign.

## 2019-09-19 NOTE — Telephone Encounter (Signed)
Spoke with patient and she is coming to pick up form. Form placed upfront.

## 2019-09-20 ENCOUNTER — Other Ambulatory Visit: Payer: Self-pay | Admitting: Nurse Practitioner

## 2019-09-20 LAB — LIPID PANEL W/O CHOL/HDL RATIO
Cholesterol, Total: 211 mg/dL — ABNORMAL HIGH (ref 100–199)
HDL: 47 mg/dL (ref >39)
LDL Chol Calc (NIH): 147 mg/dL — ABNORMAL HIGH (ref 0–99)
Triglycerides: 97 mg/dL (ref 0–149)
VLDL Cholesterol Cal: 17 mg/dL (ref 5–40)

## 2019-09-20 LAB — SODIUM: Sodium: 142 mmol/L (ref 134–144)

## 2019-09-20 MED ORDER — ATORVASTATIN CALCIUM 80 MG PO TABS: 80.0000 mg | ORAL_TABLET | Freq: Every day | ORAL | 3 refills | Status: DC

## 2019-09-20 NOTE — Progress Notes (Signed)
Please let Tina Mcdonald know her labs have returned.  Sodium level has returned to normal.  Cholesterol levels are still elevated, even with increase in Atorvastatin to 40 MG.  Would like to further increase this to max dose of 80 MG for tighter stroke prevention control with her diabetes.  Is she agreeable with this change?  If so I will send this in and then we will recheck these levels fasting next visit.  If any questions let me know.  Have a good day.

## 2019-09-24 ENCOUNTER — Ambulatory Visit
Admission: EM | Admit: 2019-09-24 | Discharge: 2019-09-24 | Disposition: A | Discharge status: Home / Self Care | Payer: Medicare Other | Attending: Family Medicine | Admitting: Family Medicine

## 2019-09-24 ENCOUNTER — Other Ambulatory Visit: Payer: Self-pay

## 2019-09-24 ENCOUNTER — Encounter: Payer: Self-pay | Admitting: Emergency Medicine

## 2019-09-24 DIAGNOSIS — M542 Cervicalgia: Secondary | ICD-10-CM | POA: Diagnosis not present

## 2019-09-24 DIAGNOSIS — M1990 Unspecified osteoarthritis, unspecified site: Secondary | ICD-10-CM | POA: Diagnosis not present

## 2019-09-24 DIAGNOSIS — S29012A Strain of muscle and tendon of back wall of thorax, initial encounter: Secondary | ICD-10-CM

## 2019-09-24 MED ORDER — HYDROCODONE-ACETAMINOPHEN 5-325 MG PO TABS: ORAL_TABLET | ORAL | 0 refills | Status: DC

## 2019-09-24 NOTE — ED Provider Notes (Signed)
[facility]    CSN: [id number] Arrival date & time: 09/24/19  1000      History   Chief Complaint Chief Complaint  Patient presents with  . Headache  . Neck Pain    HPI Tina Mcdonald is a 79 y.o. female.   79 yo female with a c/o headache, neck pains, and upper back/shoulder pain on both sides for the past 3 days.  Denies any falls or other traumatic injury. Denies any rash, fevers, chills, numbness/tingling. States she took a muscle relaxer that she had at home (name unknown) without any relief. Patient also states she has a history of arthritis. Has voltaren gel at home but has not tried this.    Headache Associated symptoms: neck pain   Neck Pain Associated symptoms: headaches     Past Medical History:  Diagnosis Date  . Anemia   . Arthritis   . Chronic kidney disease   . Colon polyps   . Diabetes mellitus without complication ([place])   . Diabetic neuropathy ([place])   . Dyspnea   . GERD (gastroesophageal reflux disease)   . Hyperlipemia   . Hypertension   . Osteoarthritis   . Radiculopathy    weakness in right leg  . Sleep apnea    does not have her cpap anymore  . Stress incontinence   . Urticaria   . Vitamin B 12 deficiency   . Wears glasses     Patient Active Problem List   Diagnosis Date Noted  . Nail fungus 04/12/2019  . Diverticula, colon   . Hiatal hernia   . Sleep apnea 02/24/2017  . Urge incontinence 11/24/2016  . Benign neoplasm of sigmoid colon   . CKD stage 3 due to type 2 diabetes mellitus ([place]) 02/20/2015  . Polyp of sigmoid colon 01/16/2015  . Iron deficiency anemia 01/16/2015  . B12 deficiency 01/16/2015  . Radiculopathy 01/16/2015  . Osteoarthritis 01/16/2015  . Hypertension associated with diabetes ([place]) 01/16/2015  . Hyperlipidemia associated with type 2 diabetes mellitus ([place]) 01/16/2015  . Diabetic neuropathy ([place]) 01/16/2015  . GERD (gastroesophageal reflux disease) 01/16/2015    Past Surgical History:    Procedure Laterality Date  . ABDOMINAL HYSTERECTOMY    . APPENDECTOMY    . BREAST BIOPSY Right    right-neg  . BREAST BIOPSY Left    us/bx/clip-neg  . CARPAL TUNNEL RELEASE     right  . COLONOSCOPY    . COLONOSCOPY WITH PROPOFOL N/A 12/10/2015   Procedure: COLONOSCOPY WITH PROPOFOL;  Surgeon: Lucilla Lame, MD;  Location: [place];  Service: Endoscopy;  Laterality: N/A;  Diabetic - oral meds Sleep Apnea LATEX allergy  . COLONOSCOPY WITH PROPOFOL N/A 01/17/2019   Procedure: COLONOSCOPY WITH PROPOFOL;  Surgeon: Virgel Manifold, MD;  Location: [place];  Service: Endoscopy;  Laterality: N/A;  . CYSTOSCOPY    . DILATION AND CURETTAGE OF UTERUS    . ESOPHAGOGASTRODUODENOSCOPY (EGD) WITH PROPOFOL N/A 01/17/2019   Procedure: ESOPHAGOGASTRODUODENOSCOPY (EGD) WITH PROPOFOL;  Surgeon: Virgel Manifold, MD;  Location: [place];  Service: Endoscopy;  Laterality: N/A;  . EYE SURGERY Bilateral    lens implant  . GIVENS CAPSULE STUDY N/A 05/04/2019   Procedure: GIVENS CAPSULE STUDY;  Surgeon: Virgel Manifold, MD;  Location: [facility];  Service: Endoscopy;  Laterality: N/A;  . LUMBAR FUSION  2009  . POLYPECTOMY  12/10/2015   Procedure: POLYPECTOMY;  Surgeon: Lucilla Lame, MD;  Location: [place];  Service: Endoscopy;;  .  POLYPECTOMY  01/17/2019   Procedure: POLYPECTOMY;  Surgeon: Virgel Manifold, MD;  Location: [place];  Service: Endoscopy;;  . RESECTION DISTAL CLAVICAL Right 11/25/2013   Procedure: RIGHT SHOULDER OPEN RESECTION DISTAL CLAVICAL EXCISION SOFT TISSUE TUMOR SHOULDER DEEP SUBFASCIAL INTRAMUSCULAR/DEBRIDEMENT/ROTATOR CUFF REPAIR/BICEPS TENODESIS/MANIPULATION;  Surgeon: Renette Butters, MD;  Location: [place];  Service: Orthopedics;  Laterality: Right;  . SHOULDER ARTHROSCOPY WITH ROTATOR CUFF REPAIR AND SUBACROMIAL DECOMPRESSION Right 11/25/2013   Procedure: SHOULDER ARTHROSCOPY WITH DEBRIDEMENT  ROTATOR CUFF REPAIR  A;  Surgeon: Renette Butters, MD;  Location: [place];  Service: Orthopedics;  Laterality: Right;    OB History   No obstetric history on file.      Home Medications    Prior to Admission medications   Medication Sig Start Date End Date Taking? Authorizing Provider  acetaminophen (TYLENOL) 325 MG tablet Take 650 mg by mouth every 6 (six) hours as needed.   Yes [provider]  atorvastatin (LIPITOR) 80 MG tablet Take 1 tablet (80 mg total) by mouth daily. 09/20/19  Yes [name], Jolene T, NP  ciclopirox (PENLAC) 8 % solution Apply topically at bedtime. Apply over nail and surrounding skin. Apply daily over previous coat. After seven (7) days, may remove with alcohol and continue cycle. 09/01/19  Yes [name], Jolene T, NP  cloNIDine (CATAPRES) 0.1 MG tablet Take 0.1 mg by mouth 2 (two) times daily.   Yes [provider]  diclofenac sodium (VOLTAREN) 1 % GEL Apply 2 g topically 2 (two) times daily as needed. 11/17/18  Yes [name], Jolene T, NP  Ferrous Sulfate (SLOW FE PO) Take 1 tablet by mouth daily.   Yes [provider]  fluconazole (DIFLUCAN) 200 MG tablet Take 200 mg by mouth once a week.   Yes [provider]  gabapentin (NEURONTIN) 100 MG capsule TAKE 3 CAPSULES BY MOUTH AT BEDTIME Patient taking differently: Take 2 tablets daily at bedtime 08/15/19  Yes [name], Jolene T, NP  hydrochlorothiazide (HYDRODIURIL) 25 MG tablet Take 1 tablet by mouth once daily 09/02/19  Yes [name], Jolene T, NP  metFORMIN (GLUCOPHAGE-XR) 500 MG 24 hr tablet Take 1 tablet (500 mg total) by mouth 2 (two) times daily. 03/30/19  Yes [name], Henrine Screws T, NP  metoprolol succinate (TOPROL-XL) 50 MG 24 hr tablet Take 1 tablet by mouth once daily 09/07/19  Yes [name], Jolene T, NP  omeprazole (PRILOSEC) 20 MG capsule Take 1 capsule by mouth once daily 07/18/19  Yes [name], Jolene T, NP  vitamin C (ASCORBIC ACID) 250 MG tablet Take 2 tablets (500 mg  total) by mouth daily. 06/14/19  Yes Earlie Server, MD  blood glucose meter kit and supplies KIT Dispense based on patient and insurance preference. Use up to four times daily as directed. (FOR ICD-9 250.00, 250.01). 09/22/18   [name], Jolene T, NP  glucose blood test strip Check daily. ICD 10 E11.9 01/16/15   Kathrine Haddock, NP  HYDROcodone-acetaminophen (NORCO/VICODIN) 5-325 MG tablet 1-2 tabs po bid prn 09/24/19   Norval Gable, MD    Family History Family History  Problem Relation Age of Onset  . Heart disease Mother   . Diabetes Mother   . Hypertension Mother   . Heart disease Father   . Hypertension Father   . Diabetes Sister   . Hypertension Sister   . Heart disease Sister   . Kidney disease Sister   . Diabetes Sister   . Hypertension Sister   . Breast cancer  Cousin 74  . Non-Hodgkin's lymphoma Maternal Uncle     Social History Social History   Tobacco Use  . Smoking status: Former Smoker    Packs/day: 1.00    Years: 15.00    Pack years: 15.00    Quit date: 11/22/1997    Years since quitting: 21.8  . Smokeless tobacco: Never Used  Substance Use Topics  . Alcohol use: No  . Drug use: No     Allergies   Contrast media [iodinated diagnostic agents], Penicillins, Sulfa antibiotics, Latex, Neosporin [neomycin-bacitracin zn-polymyx], and Other   Review of Systems Review of Systems  Musculoskeletal: Positive for neck pain.  Neurological: Positive for headaches.     Physical Exam Triage Vital Signs ED Triage Vitals  Enc Vitals Group     BP 09/24/19 1010 (!) 198/82     Pulse Rate 09/24/19 1010 92     Resp 09/24/19 1010 16     Temp 09/24/19 1010 99 F (37.2 C)     Temp Source 09/24/19 1010 Oral     SpO2 09/24/19 1010 99 %     Weight 09/24/19 1009 163 lb (73.9 kg)     Height 09/24/19 1009 '5\' 2"'  (1.575 m)     Head Circumference --      Peak Flow --      Pain Score 09/24/19 1009 9     Pain Loc --      Pain Edu? --      Excl. in [place]? --    No data  found.  Updated Vital Signs BP (!) 198/82 (BP Location: Left Arm) Comment: Patient states that she has not taken her BP medicines this morning.  Pulse 92   Temp 99 F (37.2 C) (Oral)   Resp 16   Ht '5\' 2"'  (1.575 m)   Wt 73.9 kg   LMP  (LMP Unknown)   SpO2 99%   BMI 29.81 kg/m   Visual Acuity Right Eye Distance:   Left Eye Distance:   Bilateral Distance:    Right Eye Near:   Left Eye Near:    Bilateral Near:     Physical Exam Vitals and nursing note reviewed.  Constitutional:      General: She is not in acute distress.    Appearance: She is not toxic-appearing or diaphoretic.  Musculoskeletal:     Cervical back: Spasms (over the paraspinous muscles) present.     Thoracic back: Spasms (over the trapezius muscles bilaterally) present.  Neurological:     Mental Status: She is alert.      UC Treatments / Results  Labs (all labs ordered are listed, but only abnormal results are displayed) Labs Reviewed - No data to display  EKG   Radiology No results found.  Procedures Procedures (including critical care time)  Medications Ordered in UC Medications - No data to display  Initial Impression / Assessment and Plan / UC Course  I have reviewed the triage vital signs and the nursing notes.  Pertinent labs & imaging results that were available during my care of the patient were reviewed by me and considered in my medical decision making (see chart for details).      Final Clinical Impressions(s) / UC Diagnoses   Final diagnoses:  Upper back strain, initial encounter  Neck pain  Arthritis    ED Prescriptions    Medication Sig Dispense Auth. Provider   HYDROcodone-acetaminophen (NORCO/VICODIN) 5-325 MG tablet 1-2 tabs po bid prn 8 tablet Norval Gable, MD  1. diagnosis reviewed with patient 2. rx as per orders above; reviewed possible side effects, interactions, risks and benefits  3. Recommend supportive treatment with tylenol, range of motion  exercises, heat  4. Follow-up prn if symptoms worsen or don't improve   I have reviewed the PDMP during this encounter.   Norval Gable, MD 09/24/19 1227

## 2019-09-24 NOTE — ED Triage Notes (Signed)
Patient c/o headache, neck pain and bilateral shoulder that started on Friday.  Patient denies injury or fall.  Patient states that she took a muscle relaxer on Friday with no relief.  Patient also states that she has tried Tylenol but has not helped.

## 2019-09-26 ENCOUNTER — Telehealth: Payer: Self-pay | Admitting: Nurse Practitioner

## 2019-09-26 NOTE — Telephone Encounter (Signed)
Email to Pt From: Jill Alexanders [facility])  Sent: Monday, September 26, 2019 1:22 PM To: [email or web address] Subject: Secure: CPAP Resources  Ms. Collinsworth, Thank you for taking the time to speak with me this morning regarding resources for paying for your CPAP Machine. The SleepApnea.org organization does offer financial assistance with paying for supplies but they no longer have a program to pay for the CPAP machine. [employer]  As we discussed on the phone, your [employer] program should have a benefit toward Durable Medical Equipment to help pay for the cost of the machine. I would encourage you to also reach back out to the [facility] [phone] and discuss the payment program through [employer] or [employer] that could give you an affordable monthly payment.  Please let me know if you need anything further,  [place] . [email or web address]  [phone]

## 2019-09-26 NOTE — Telephone Encounter (Signed)
   [facility] 09/26/2019 1st Attempt  Name: Tina Mcdonald   MRN: [id number]   DOB: 1941-04-20   AGE: 79 y.o.   GENDER: female   PCP [name], NP.   Called pt regarding Community Resource Referral for CPAP supplies. Pt stated that she had her sleep study at [facility] in 2020 and that the price of the CPAP machine plus study was expensive. She stated that she just started on [facility] tier 1 where she will have additional coverage for vision and dental. We discussed that she may be able to get her monthly payment down to something that would be affordable for her to start paying toward CPAP. she stated that her son may be able to help her pay for a payment plan through [facility] or [place] but that she is not in a position to be able to afford any more bills. She asked that I email her resources for [email or web address] and include my contact information.   Closing referral pending any other needs of patient.     [place] . [email or web address]  [phone]

## 2019-10-15 ENCOUNTER — Other Ambulatory Visit: Payer: Self-pay | Admitting: Nurse Practitioner

## 2019-10-15 NOTE — Telephone Encounter (Signed)
Requested Prescriptions  Pending Prescriptions Disp Refills  . gabapentin (NEURONTIN) 100 MG capsule [Pharmacy Med Name: Gabapentin 100 MG Oral Capsule] 90 capsule 1    Sig: TAKE 3 CAPSULES BY MOUTH AT BEDTIME     Neurology: Anticonvulsants - gabapentin Passed - 10/15/2019 12:57 PM      Passed - Valid encounter within last 12 months    Recent Outpatient Visits          1 month ago Other diabetic neurological complication associated with type 2 diabetes mellitus ([place])   [place] [name], Jolene T, NP   5 months ago Type 2 diabetes mellitus with stage 3a chronic kidney disease, without long-term current use of insulin ([place])   [place] [name], [place] T, NP   6 months ago Nail fungus   [place], [place] T, NP   6 months ago B12 deficiency   [facility] [place], [place] T, NP   8 months ago Uncontrolled type 2 diabetes mellitus with chronic kidney disease ([place])   [place], Jolene T, NP      Future Appointments            In 3 days Brendolyn Patty, MD [place]   In 1 month [name], [name], NP [employer] [facility]

## 2019-10-18 ENCOUNTER — Ambulatory Visit: Payer: Medicare Other | Admitting: Dermatology

## 2019-10-18 ENCOUNTER — Other Ambulatory Visit: Payer: Self-pay

## 2019-10-18 DIAGNOSIS — B351 Tinea unguium: Secondary | ICD-10-CM

## 2019-10-18 MED ORDER — FLUCONAZOLE 200 MG PO TABS: 200.0000 mg | ORAL_TABLET | ORAL | 0 refills | Status: DC

## 2019-10-18 NOTE — Progress Notes (Signed)
   Follow-Up Visit   Subjective  Tina Mcdonald is a 79 y.o. female who presents for the following: Follow-up.  Patient here today for 2 month follow up for onycholysis of fingernails. Patient took Diflucan 200mg  1 QWK for 12 weeks and has been using ciclopirox nightly (Jublia not covered). She has noticed some improvement. No side effects noted from Diflucan. She had culture proven Candida Parapsilosis from nails.  The following portions of the chart were reviewed this encounter and updated as appropriate:      Review of Systems:  No other skin or systemic complaints except as noted in HPI or Assessment and Plan.  Objective  Well appearing patient in no apparent distress; mood and affect are within normal limits.  A focused examination was performed including hands, fingernails. Relevant physical exam findings are noted in the Assessment and Plan.  Objective  fingernails: Onycholysis with mild subungual debris thumbnails bilateral Mild onycholysis bilateral 5th fingernails Much improved when compared to photos  Assessment & Plan  Onychomycosis fingernails  Improving Cont ciclopirox lacquer qhs to nails.   Continue fluconazole 200mg  1 PO QWK x 2 more months #8 0RF Do not take statin on same day taking the fluconazole.  fluconazole (DIFLUCAN) 200 MG tablet - fingernails  Return in about 3 months (around 01/17/2020) for tinea unguium.  Graciella Belton, RMA, am acting as scribe for Brendolyn Patty, MD .  Documentation: I have reviewed the above documentation for accuracy and completeness, and I agree with the above.  Brendolyn Patty, MD

## 2019-10-21 ENCOUNTER — Telehealth: Payer: Self-pay

## 2019-10-28 ENCOUNTER — Telehealth: Payer: Self-pay

## 2019-10-28 ENCOUNTER — Ambulatory Visit: Payer: Self-pay | Admitting: General Practice

## 2019-10-28 NOTE — Chronic Care Management (AMB) (Signed)
  Chronic Care Management   Outreach Note  10/28/2019 Name: ABBIEGAIL HAMPEL MRN: [id number] DOB: 1940/08/14  Referred by: [name], NP Reason for referral : Chronic Care Management (Follow up on DME needs and chronic conditions)   An unsuccessful telephone outreach was attempted today. The patient was referred to the case management team for assistance with care management and care coordination.   Follow Up Plan: A HIPPA compliant phone message was left for the patient providing contact information and requesting a return call.   Noreene Larsson RN, MSN, [facility] Mobile: [phone]

## 2019-11-01 DIAGNOSIS — E113393 Type 2 diabetes mellitus with moderate nonproliferative diabetic retinopathy without macular edema, bilateral: Secondary | ICD-10-CM | POA: Diagnosis not present

## 2019-11-02 ENCOUNTER — Other Ambulatory Visit: Payer: Self-pay

## 2019-11-02 ENCOUNTER — Encounter (INDEPENDENT_AMBULATORY_CARE_PROVIDER_SITE_OTHER): Payer: Medicare Other | Admitting: Ophthalmology

## 2019-11-02 ENCOUNTER — Telehealth: Payer: Self-pay

## 2019-11-02 DIAGNOSIS — H43823 Vitreomacular adhesion, bilateral: Secondary | ICD-10-CM | POA: Diagnosis not present

## 2019-11-02 DIAGNOSIS — I10 Essential (primary) hypertension: Secondary | ICD-10-CM | POA: Diagnosis not present

## 2019-11-02 DIAGNOSIS — E113293 Type 2 diabetes mellitus with mild nonproliferative diabetic retinopathy without macular edema, bilateral: Secondary | ICD-10-CM

## 2019-11-02 DIAGNOSIS — H35033 Hypertensive retinopathy, bilateral: Secondary | ICD-10-CM | POA: Diagnosis not present

## 2019-11-02 DIAGNOSIS — E11319 Type 2 diabetes mellitus with unspecified diabetic retinopathy without macular edema: Secondary | ICD-10-CM

## 2019-11-02 DIAGNOSIS — H43813 Vitreous degeneration, bilateral: Secondary | ICD-10-CM

## 2019-11-11 ENCOUNTER — Other Ambulatory Visit: Payer: Self-pay

## 2019-11-11 DIAGNOSIS — B351 Tinea unguium: Secondary | ICD-10-CM

## 2019-11-11 MED ORDER — FLUCONAZOLE 200 MG PO TABS: 200.0000 mg | ORAL_TABLET | ORAL | 0 refills | Status: DC

## 2019-11-11 MED ORDER — METFORMIN HCL ER 500 MG PO TB24: 500.0000 mg | ORAL_TABLET | Freq: Two times a day (BID) | ORAL | 4 refills | Status: DC

## 2019-11-11 MED ORDER — METOPROLOL SUCCINATE ER 50 MG PO TB24: 50.0000 mg | ORAL_TABLET | Freq: Every day | ORAL | 4 refills | Status: DC

## 2019-11-11 MED ORDER — GABAPENTIN 100 MG PO CAPS: 300.0000 mg | ORAL_CAPSULE | Freq: Every day | ORAL | 4 refills | Status: DC

## 2019-11-11 MED ORDER — OMEPRAZOLE 20 MG PO CPDR: 20.0000 mg | DELAYED_RELEASE_CAPSULE | Freq: Every day | ORAL | 4 refills | Status: DC

## 2019-11-11 MED ORDER — HYDROCHLOROTHIAZIDE 25 MG PO TABS: 25.0000 mg | ORAL_TABLET | Freq: Every day | ORAL | 4 refills | Status: DC

## 2019-11-11 MED ORDER — ATORVASTATIN CALCIUM 80 MG PO TABS: 80.0000 mg | ORAL_TABLET | Freq: Every day | ORAL | 4 refills | Status: DC

## 2019-11-11 NOTE — Telephone Encounter (Signed)
Now using mail order pharmacy and needs new prescriptions sent to them.

## 2019-12-02 ENCOUNTER — Ambulatory Visit (INDEPENDENT_AMBULATORY_CARE_PROVIDER_SITE_OTHER): Payer: Medicare Other | Admitting: Nurse Practitioner

## 2019-12-02 ENCOUNTER — Ambulatory Visit: Payer: Medicare Other | Admitting: Nurse Practitioner

## 2019-12-02 ENCOUNTER — Other Ambulatory Visit: Payer: Self-pay

## 2019-12-02 ENCOUNTER — Encounter: Payer: Self-pay | Admitting: Nurse Practitioner

## 2019-12-02 VITALS — BP 123/80 | HR 69 | Temp 98.4°F | Wt 165.0 lb

## 2019-12-02 DIAGNOSIS — E1169 Type 2 diabetes mellitus with other specified complication: Secondary | ICD-10-CM | POA: Diagnosis not present

## 2019-12-02 DIAGNOSIS — E1149 Type 2 diabetes mellitus with other diabetic neurological complication: Secondary | ICD-10-CM | POA: Diagnosis not present

## 2019-12-02 DIAGNOSIS — E1122 Type 2 diabetes mellitus with diabetic chronic kidney disease: Secondary | ICD-10-CM | POA: Diagnosis not present

## 2019-12-02 DIAGNOSIS — E785 Hyperlipidemia, unspecified: Secondary | ICD-10-CM | POA: Diagnosis not present

## 2019-12-02 DIAGNOSIS — E1159 Type 2 diabetes mellitus with other circulatory complications: Secondary | ICD-10-CM | POA: Diagnosis not present

## 2019-12-02 DIAGNOSIS — Z1159 Encounter for screening for other viral diseases: Secondary | ICD-10-CM | POA: Diagnosis not present

## 2019-12-02 DIAGNOSIS — G4733 Obstructive sleep apnea (adult) (pediatric): Secondary | ICD-10-CM

## 2019-12-02 DIAGNOSIS — N183 Chronic kidney disease, stage 3 unspecified: Secondary | ICD-10-CM | POA: Diagnosis not present

## 2019-12-02 DIAGNOSIS — D510 Vitamin B12 deficiency anemia due to intrinsic factor deficiency: Secondary | ICD-10-CM | POA: Diagnosis not present

## 2019-12-02 DIAGNOSIS — E538 Deficiency of other specified B group vitamins: Secondary | ICD-10-CM | POA: Diagnosis not present

## 2019-12-02 DIAGNOSIS — D508 Other iron deficiency anemias: Secondary | ICD-10-CM

## 2019-12-02 DIAGNOSIS — I10 Essential (primary) hypertension: Secondary | ICD-10-CM

## 2019-12-02 LAB — BAYER DCA HB A1C WAIVED: HB A1C (BAYER DCA - WAIVED): 6.2 % (ref <7.0)

## 2019-12-02 MED ORDER — CYANOCOBALAMIN 1000 MCG/ML IJ SOLN
1000.0000 ug | Freq: Once | INTRAMUSCULAR | Status: AC
Start: 2019-12-02 — End: 2019-12-02
  Administered 2019-12-02: 1000 ug via INTRAMUSCULAR

## 2019-12-02 NOTE — Assessment & Plan Note (Signed)
Chronic, ongoing with BP at goal today. Continue to recommend checking BP at home a few mornings a week and documenting.  Continue current medication regimen and adjust as needed. BMP outpatient. Would consider addition of ACE or ARB with d/c of HCTZ at next visit, will discuss with patient as unsure she has tried these before.  Return in 3 months.

## 2019-12-02 NOTE — Assessment & Plan Note (Signed)
Chronic, ongoing.  Continue current medication regimen and adjust as needed. Lipid panel today. 

## 2019-12-02 NOTE — Assessment & Plan Note (Signed)
Sleep study performed, could not afford to return to clinic or obtain equipment.  Will continue to work with CCM team on this. 

## 2019-12-02 NOTE — Progress Notes (Addendum)
BP 123/80   Pulse 69   Temp 98.4 F (36.9 C) (Oral)   Wt 165 lb (74.8 kg)   LMP  (LMP Unknown)   SpO2 98%   BMI 30.18 kg/m    Subjective:    Patient ID: Tina Mcdonald, female    DOB: 03/14/1941, 79 y.o.   MRN: [id number]  HPI: Tina Mcdonald is a 79 y.o. female  Chief Complaint  Patient presents with  . Diabetes  . Hypertension  . Hyperlipidemia  . B12 Injection   DIABETES Continues on Metformin and Gabapentin. Last A1C 6.5% in March.Continues on B12 and Slow Fe for anemia, last saw Dr. Tasia Catchings in June 14, 2019 -- is due for B12 injection.She has seen GI and colonoscopy and EGD showed benign findings on review. Does have a hiatal hernia. Has underling OSA -- but could not afford cost of return to sleep study location or equipment.   Hypoglycemic episodes:no Polydipsia/polyuria:no Visual disturbance:no Chest pain:no Paresthesias:no Glucose Monitoring:yes Accucheck frequency: every other day Fasting glucose:112 to 125 Post prandial: Evening: Before meals: Taking Insulin?:no Long acting insulin: Short acting insulin: Blood Pressure Monitoring:not checking Retinal Examination:Up To Date -- end of 2020 (Dr. Bell, [place]) Foot Exam:Up to Date Pneumovax:Up to Date Influenza:Up to Date Aspirin:no  HYPERTENSION / HYPERLIPIDEMIA Continues on HCTZ and Clonidine + Lipitor.Last saw Dr. Clay[place] Bigness 03/22/2019.  Has been holding her Lipitor due to currently taking fungal medication, Diflucan for nails. Satisfied with current treatment?yes Duration of hypertension:chronic BP monitoring frequency:not checking BP range:  BP medication side effects:no Duration of hyperlipidemia:chronic Cholesterol medication side effects:no Cholesterol supplements: none Medication compliance:good compliance Aspirin:no Recent stressors:no Recurrent  headaches:no Visual changes:no Palpitations:no Dyspnea:no Chest pain:no Lower extremity edema:no Dizzy/lightheaded:no   Relevant past medical, surgical, family and social history reviewed and updated as indicated. Interim medical history since our last visit reviewed. Allergies and medications reviewed and updated.  Review of Systems  Constitutional: Negative for activity change, appetite change, diaphoresis, fatigue and fever.  Respiratory: Negative for cough, chest tightness, shortness of breath and wheezing.   Cardiovascular: Negative for chest pain, palpitations and leg swelling.  Gastrointestinal: Negative.   Endocrine: Negative for cold intolerance, heat intolerance, polydipsia, polyphagia and polyuria.  Neurological: Negative.   Psychiatric/Behavioral: Negative.     Per HPI unless specifically indicated above     Objective:    BP 123/80   Pulse 69   Temp 98.4 F (36.9 C) (Oral)   Wt 165 lb (74.8 kg)   LMP  (LMP Unknown)   SpO2 98%   BMI 30.18 kg/m   Wt Readings from Last 3 Encounters:  12/02/19 165 lb (74.8 kg)  09/24/19 163 lb (73.9 kg)  04/19/19 167 lb (75.8 kg)    Physical Exam Vitals and nursing note reviewed.  Constitutional:      General: She is awake. She is not in acute distress.    Appearance: She is well-developed, well-groomed and overweight. She is not ill-appearing.  HENT:     Head: Normocephalic.     Right Ear: Hearing normal.     Left Ear: Hearing normal.  Eyes:     General: Lids are normal.        Right eye: No discharge.        Left eye: No discharge.     Conjunctiva/sclera: Conjunctivae normal.     Pupils: Pupils are equal, round, and reactive to light.  Neck:     Thyroid: No thyromegaly.     Vascular:  No carotid bruit.  Cardiovascular:     Rate and Rhythm: Normal rate and regular rhythm.     Heart sounds: Normal heart sounds. No murmur heard.  No gallop.   Pulmonary:     Effort: Pulmonary effort is normal. No accessory  muscle usage or respiratory distress.     Breath sounds: Normal breath sounds.  Abdominal:     General: Bowel sounds are normal.     Palpations: Abdomen is soft. There is no hepatomegaly or splenomegaly.  Musculoskeletal:     Cervical back: Normal range of motion and neck supple.     Right lower leg: No edema.     Left lower leg: No edema.  Lymphadenopathy:     Cervical: No cervical adenopathy.  Skin:    General: Skin is warm and dry.  Neurological:     Mental Status: She is alert and oriented to person, place, and time.  Psychiatric:        Attention and Perception: Attention normal.        Mood and Affect: Mood normal.        Speech: Speech normal.        Behavior: Behavior normal. Behavior is cooperative.        Thought Content: Thought content normal.    Diabetic Foot Exam - Simple   Simple Foot Form Visual Inspection See comments: Yes Sensation Testing Intact to touch and monofilament testing bilaterally: Yes Pulse Check Posterior Tibialis and Dorsalis pulse intact bilaterally: Yes Comments Onychomycosis bilateral toenails.     Results for orders placed or performed in visit on 09/19/19  Sodium  Result Value Ref Range   Sodium 142 134 - 144 mmol/L  Lipid Panel w/o Chol/HDL Ratio  Result Value Ref Range   Cholesterol, Total 211 (H) 100 - 199 mg/dL   Triglycerides 97 0 - 149 mg/dL   HDL 47 >39 mg/dL   VLDL Cholesterol Cal 17 5 - 40 mg/dL   LDL Chol Calc (NIH) 147 (H) 0 - 99 mg/dL      Assessment & Plan:   Problem List Items Addressed This Visit      Cardiovascular and Mediastinum   Hypertension associated with diabetes ([employer])    Chronic, ongoing with BP at goal today. Continue to recommend checking BP at home a few mornings a week and documenting.  Continue current medication regimen and adjust as needed. BMP outpatient. Would consider addition of ACE or ARB with d/c of HCTZ at next visit, will discuss with patient as unsure she has tried these before.  Return  in 3 months.      Relevant Orders   Bayer DCA Hb A1c Waived   Basic metabolic panel     Respiratory   Sleep apnea    Sleep study performed, could not afford to return to clinic or obtain equipment.  Will continue to work with CCM team on this.        Endocrine   Hyperlipidemia associated with type 2 diabetes mellitus (HCC)    Chronic, ongoing.  Continue current medication regimen and adjust as needed.  Lipid panel today.      Relevant Orders   Lipid Panel w/o Chol/HDL Ratio   Bayer DCA Hb A1c Waived   Diabetic neuropathy (HCC) - Primary    Chronic, ongoing with A1C today 6.3%.  Continue Metformin 500 MG twice a day and Gabapentin for neuropathy + continuing to monitor blood sugar daily.  Return in 3 months.  CKD stage 3 due to type 2 diabetes mellitus (HCC)    Chronic, ongoing with today A1C 6.3% and last GFR 59.  Continue current medication regimen and renal dose Metformin in future if decline noted.  Recommend continue to check BS at home consistently and document.  Consider addition of ACE or ARB in future, will discuss with her next visit as unsure she has tried these before.  Return in 3 months.      Relevant Orders   Bayer DCA Hb A1c Waived   Basic metabolic panel     Other   Iron deficiency anemia    Chronic, ongoing.  Continue current medication regimen and collaboration with hematology. Obtain iron level today.      Relevant Medications   cyanocobalamin ((VITAMIN B-12)) injection 1,000 mcg   B12 deficiency    Continue monthly injections and obtain outpatient B12 level.      Relevant Medications   cyanocobalamin ((VITAMIN B-12)) injection 1,000 mcg   Other Relevant Orders   Iron, TIBC and Ferritin Panel   B12    Other Visit Diagnoses    Need for hepatitis C screening test       Relevant Orders   Hepatitis C antibody       Follow up plan: Return in about 3 months (around 03/03/2020) for T2DM, HTN/HLD.

## 2019-12-02 NOTE — Assessment & Plan Note (Signed)
Chronic, ongoing with today A1C 6.3% and last GFR 59.  Continue current medication regimen and renal dose Metformin in future if decline noted.  Recommend continue to check BS at home consistently and document.  Consider addition of ACE or ARB in future, will discuss with her next visit as unsure she has tried these before.  Return in 3 months.

## 2019-12-02 NOTE — Assessment & Plan Note (Signed)
Chronic, ongoing with A1C today 6.3%.  Continue Metformin 500 MG twice a day and Gabapentin for neuropathy + continuing to monitor blood sugar daily.  Return in 3 months.

## 2019-12-02 NOTE — Patient Instructions (Signed)

## 2019-12-02 NOTE — Assessment & Plan Note (Signed)
Continue monthly injections and obtain outpatient B12 level.

## 2019-12-02 NOTE — Assessment & Plan Note (Signed)
Chronic, ongoing.  Continue current medication regimen and collaboration with hematology. Obtain iron level today.

## 2019-12-03 LAB — BASIC METABOLIC PANEL
BUN/Creatinine Ratio: 16 (ref 12–28)
BUN: 13 mg/dL (ref 8–27)
CO2: 27 mmol/L (ref 20–29)
Calcium: 10 mg/dL (ref 8.7–10.3)
Chloride: 103 mmol/L (ref 96–106)
Creatinine, Ser: 0.83 mg/dL (ref 0.57–1.00)
GFR calc Af Amer: 79 mL/min/{1.73_m2} (ref >59)
GFR calc non Af Amer: 68 mL/min/{1.73_m2} (ref >59)
Glucose: 112 mg/dL — ABNORMAL HIGH (ref 65–99)
Potassium: 4.5 mmol/L (ref 3.5–5.2)
Sodium: 142 mmol/L (ref 134–144)

## 2019-12-03 LAB — IRON,TIBC AND FERRITIN PANEL
Ferritin: 134 ng/mL (ref 15–150)
Iron Saturation: 25 % (ref 15–55)
Iron: 62 ug/dL (ref 27–139)
Total Iron Binding Capacity: 253 ug/dL (ref 250–450)
UIBC: 191 ug/dL (ref 118–369)

## 2019-12-03 LAB — LIPID PANEL W/O CHOL/HDL RATIO
Cholesterol, Total: 185 mg/dL (ref 100–199)
HDL: 51 mg/dL (ref >39)
LDL Chol Calc (NIH): 121 mg/dL — ABNORMAL HIGH (ref 0–99)
Triglycerides: 67 mg/dL (ref 0–149)
VLDL Cholesterol Cal: 13 mg/dL (ref 5–40)

## 2019-12-03 LAB — VITAMIN B12: Vitamin B-12: 366 pg/mL (ref 232–1245)

## 2019-12-03 LAB — HEPATITIS C ANTIBODY: Hep C Virus Ab: <0.1 s/co ratio (ref 0.0–0.9)

## 2019-12-04 NOTE — Progress Notes (Signed)
Good morning, please let Ms. Heng know her labs have returned: - Total cholesterol has improved, but LDL (bad cholesterol) remains elevated above goal.  Was she fasting?  Is she taking her Atorvastatin 80 MG every day? If she was not fasting I would like to check this next visit with her fasting and want her to focus on diet at home too. - Kidney function is good and Hep C was negative. - Iron level looks good, but B12 level on lower side, probably due to one missed dose.  Keep taking B12.  Any questions?  Have a great day!!

## 2019-12-09 ENCOUNTER — Other Ambulatory Visit: Payer: Self-pay

## 2019-12-09 ENCOUNTER — Inpatient Hospital Stay: Payer: Medicare Other | Attending: Oncology

## 2019-12-09 DIAGNOSIS — N183 Chronic kidney disease, stage 3 unspecified: Secondary | ICD-10-CM

## 2019-12-09 DIAGNOSIS — D631 Anemia in chronic kidney disease: Secondary | ICD-10-CM | POA: Insufficient documentation

## 2019-12-09 DIAGNOSIS — N189 Chronic kidney disease, unspecified: Secondary | ICD-10-CM | POA: Diagnosis not present

## 2019-12-09 LAB — CBC WITH DIFFERENTIAL/PLATELET
Abs Immature Granulocytes: 0.02 10*3/uL (ref 0.00–0.07)
Basophils Absolute: 0 10*3/uL (ref 0.0–0.1)
Basophils Relative: 0 %
Eosinophils Absolute: 0.2 10*3/uL (ref 0.0–0.5)
Eosinophils Relative: 3 %
HCT: 35.5 % — ABNORMAL LOW (ref 36.0–46.0)
Hemoglobin: 11 g/dL — ABNORMAL LOW (ref 12.0–15.0)
Immature Granulocytes: 0 %
Lymphocytes Relative: 30 %
Lymphs Abs: 1.8 10*3/uL (ref 0.7–4.0)
MCH: 24.3 pg — ABNORMAL LOW (ref 26.0–34.0)
MCHC: 31 g/dL (ref 30.0–36.0)
MCV: 78.4 fL — ABNORMAL LOW (ref 80.0–100.0)
Monocytes Absolute: 0.5 10*3/uL (ref 0.1–1.0)
Monocytes Relative: 8 %
Neutro Abs: 3.5 10*3/uL (ref 1.7–7.7)
Neutrophils Relative %: 59 %
Platelets: 216 10*3/uL (ref 150–400)
RBC: 4.53 MIL/uL (ref 3.87–5.11)
RDW: 15.7 % — ABNORMAL HIGH (ref 11.5–15.5)
WBC: 6.1 10*3/uL (ref 4.0–10.5)
nRBC: 0 % (ref 0.0–0.2)

## 2019-12-09 LAB — COMPREHENSIVE METABOLIC PANEL
ALT: 21 U/L (ref 0–44)
AST: 21 U/L (ref 15–41)
Albumin: 3.9 g/dL (ref 3.5–5.0)
Alkaline Phosphatase: 56 U/L (ref 38–126)
Anion gap: 10 (ref 5–15)
BUN: 16 mg/dL (ref 8–23)
CO2: 29 mmol/L (ref 22–32)
Calcium: 9.3 mg/dL (ref 8.9–10.3)
Chloride: 101 mmol/L (ref 98–111)
Creatinine, Ser: 0.84 mg/dL (ref 0.44–1.00)
GFR calc Af Amer: >60 mL/min (ref >60)
GFR calc non Af Amer: >60 mL/min (ref >60)
Glucose, Bld: 98 mg/dL (ref 70–99)
Potassium: 4.1 mmol/L (ref 3.5–5.1)
Sodium: 140 mmol/L (ref 135–145)
Total Bilirubin: 0.6 mg/dL (ref 0.3–1.2)
Total Protein: 7.3 g/dL (ref 6.5–8.1)

## 2019-12-09 LAB — IRON AND TIBC
Iron: 40 ug/dL (ref 28–170)
Saturation Ratios: 14 % (ref 10.4–31.8)
TIBC: 290 ug/dL (ref 250–450)
UIBC: 250 ug/dL

## 2019-12-09 LAB — FERRITIN: Ferritin: 87 ng/mL (ref 11–307)

## 2019-12-12 ENCOUNTER — Inpatient Hospital Stay (HOSPITAL_BASED_OUTPATIENT_CLINIC_OR_DEPARTMENT_OTHER): Payer: Medicare Other | Admitting: Oncology

## 2019-12-12 ENCOUNTER — Encounter: Payer: Self-pay | Admitting: Oncology

## 2019-12-12 DIAGNOSIS — D563 Thalassemia minor: Secondary | ICD-10-CM

## 2019-12-12 DIAGNOSIS — D649 Anemia, unspecified: Secondary | ICD-10-CM | POA: Diagnosis not present

## 2019-12-12 NOTE — Progress Notes (Signed)
Patient contacted for Mychart visit. Pt reports legs and joints feel tight.

## 2019-12-13 NOTE — Progress Notes (Signed)
HEMATOLOGY-ONCOLOGY TeleHEALTH VISIT PROGRESS NOTE  I connected with Tina Mcdonald on 12/13/19 at  2:15 PM EDT by video enabled telemedicine visit and verified that I am speaking with the correct person using two identifiers. I discussed the limitations, risks, security and privacy concerns of performing an evaluation and management service by telemedicine and the availability of in-person appointments. I also discussed with the patient that there may be a patient responsible charge related to this service. The patient expressed understanding and agreed to proceed.   Other persons participating in the visit and their role in the encounter:  None  Patient's location: Home  Provider's location: office Chief Complaint: Follow-up for anemia   INTERVAL HISTORY Tina Mcdonald is a 79 y.o. female who has above history reviewed by me today presents for follow up visit for management of anemia Problems and complaints are listed below:  Patient reports that her joints feel tight.  Otherwise no new complaints. Fatigue has improved. Review of Systems  Constitutional: Negative for appetite change, chills, fatigue and fever.  HENT:   Negative for hearing loss and voice change.   Eyes: Negative for eye problems.  Respiratory: Negative for chest tightness and cough.   Cardiovascular: Negative for chest pain.  Gastrointestinal: Negative for abdominal distention, abdominal pain and blood in stool.  Endocrine: Negative for hot flashes.  Genitourinary: Negative for difficulty urinating and frequency.   Musculoskeletal: Positive for arthralgias.  Skin: Negative for itching and rash.  Neurological: Negative for extremity weakness.  Hematological: Negative for adenopathy.  Psychiatric/Behavioral: Negative for confusion.    Past Medical History:  Diagnosis Date  . Anemia   . Arthritis   . Chronic kidney disease   . Colon polyps   . Diabetes mellitus without complication ([place])   . Diabetic  neuropathy ([place])   . Dyspnea   . GERD (gastroesophageal reflux disease)   . Hyperlipemia   . Hypertension   . Osteoarthritis   . Radiculopathy    weakness in right leg  . Sleep apnea    does not have her cpap anymore  . Stress incontinence   . Urticaria   . Vitamin B 12 deficiency   . Wears glasses    Past Surgical History:  Procedure Laterality Date  . ABDOMINAL HYSTERECTOMY    . APPENDECTOMY    . BREAST BIOPSY Right    right-neg  . BREAST BIOPSY Left    us/bx/clip-neg  . CARPAL TUNNEL RELEASE     right  . COLONOSCOPY    . COLONOSCOPY WITH PROPOFOL N/A 12/10/2015   Procedure: COLONOSCOPY WITH PROPOFOL;  Surgeon: Lucilla Lame, MD;  Location: [facility];  Service: Endoscopy;  Laterality: N/A;  Diabetic - oral meds Sleep Apnea LATEX allergy  . COLONOSCOPY WITH PROPOFOL N/A 01/17/2019   Procedure: COLONOSCOPY WITH PROPOFOL;  Surgeon: Virgel Manifold, MD;  Location: [facility];  Service: Endoscopy;  Laterality: N/A;  . CYSTOSCOPY    . DILATION AND CURETTAGE OF UTERUS    . ESOPHAGOGASTRODUODENOSCOPY (EGD) WITH PROPOFOL N/A 01/17/2019   Procedure: ESOPHAGOGASTRODUODENOSCOPY (EGD) WITH PROPOFOL;  Surgeon: Virgel Manifold, MD;  Location: [facility];  Service: Endoscopy;  Laterality: N/A;  . EYE SURGERY Bilateral    lens implant  . GIVENS CAPSULE STUDY N/A 05/04/2019   Procedure: GIVENS CAPSULE STUDY;  Surgeon: Virgel Manifold, MD;  Location: [facility];  Service: Endoscopy;  Laterality: N/A;  . LUMBAR FUSION  2009  . POLYPECTOMY  12/10/2015   Procedure: POLYPECTOMY;  Surgeon: Lucilla Lame, MD;  Location: [place];  Service: Endoscopy;;  . POLYPECTOMY  01/17/2019   Procedure: POLYPECTOMY;  Surgeon: Virgel Manifold, MD;  Location: [facility];  Service: Endoscopy;;  . RESECTION DISTAL CLAVICAL Right 11/25/2013   Procedure: RIGHT SHOULDER OPEN RESECTION DISTAL CLAVICAL EXCISION SOFT TISSUE TUMOR SHOULDER DEEP SUBFASCIAL  INTRAMUSCULAR/DEBRIDEMENT/ROTATOR CUFF REPAIR/BICEPS TENODESIS/MANIPULATION;  Surgeon: Renette Butters, MD;  Location: [place];  Service: Orthopedics;  Laterality: Right;  . SHOULDER ARTHROSCOPY WITH ROTATOR CUFF REPAIR AND SUBACROMIAL DECOMPRESSION Right 11/25/2013   Procedure: SHOULDER ARTHROSCOPY WITH DEBRIDEMENT ROTATOR CUFF REPAIR  A;  Surgeon: Renette Butters, MD;  Location: [place];  Service: Orthopedics;  Laterality: Right;    Family History  Problem Relation Age of Onset  . Heart disease Mother   . Diabetes Mother   . Hypertension Mother   . Heart disease Father   . Hypertension Father   . Diabetes Sister   . Hypertension Sister   . Heart disease Sister   . Kidney disease Sister   . Diabetes Sister   . Hypertension Sister   . Breast cancer Cousin 3  . Non-Hodgkin's lymphoma Maternal Uncle     Social History   Socioeconomic History  . Marital status: Single    Spouse name: Not on file  . Number of children: Not on file  . Years of education: Not on file  . Highest education level: Some college, no degree  Occupational History  . Occupation: retired  Tobacco Use  . Smoking status: Former Smoker    Packs/day: 1.00    Years: 15.00    Pack years: 15.00    Quit date: 11/22/1997    Years since quitting: 22.0  . Smokeless tobacco: Never Used  Vaping Use  . Vaping Use: Never used  Substance and Sexual Activity  . Alcohol use: No  . Drug use: No  . Sexual activity: Not Currently  Other Topics Concern  . Not on file  Social History Narrative  . Not on file   Social Determinants of Health   Financial Resource Strain:   . Difficulty of Paying Living Expenses:   Food Insecurity:   . Worried About Charity fundraiser in the Last Year:   . Arboriculturist in the Last Year:   Transportation Needs:   . Film/video editor (Medical):   [name] Lack of Transportation (Non-Medical):   Physical Activity:   . Days of Exercise per Week:    . Minutes of Exercise per Session:   Stress:   . Feeling of Stress :   Social Connections:   . Frequency of Communication with Friends and Family:   . Frequency of Social Gatherings with Friends and Family:   . Attends Religious Services:   . Active Member of Clubs or Organizations:   . Attends Archivist Meetings:   [name] Marital Status:   Intimate Partner Violence:   . Fear of Current or Ex-Partner:   . Emotionally Abused:   [name] Physically Abused:   . Sexually Abused:     Current Outpatient Medications on File Prior to Visit  Medication Sig Dispense Refill  . acetaminophen (TYLENOL) 325 MG tablet Take 650 mg by mouth every 6 (six) hours as needed.    [name] atorvastatin (LIPITOR) 80 MG tablet Take 1 tablet (80 mg total) by mouth daily. 90 tablet 4  . blood glucose meter kit and supplies KIT Dispense based on patient and  insurance preference. Use up to four times daily as directed. (FOR ICD-9 250.00, 250.01). 1 each 0  . ciclopirox (PENLAC) 8 % solution Apply topically at bedtime. Apply over nail and surrounding skin. Apply daily over previous coat. After seven (7) days, may remove with alcohol and continue cycle. 6.6 mL 1  . cloNIDine (CATAPRES) 0.1 MG tablet Take 0.1 mg by mouth 2 (two) times daily.    . diclofenac sodium (VOLTAREN) 1 % GEL Apply 2 g topically 2 (two) times daily as needed. 1 Tube 2  . Ferrous Sulfate (SLOW FE PO) Take 1 tablet by mouth daily.    . fluconazole (DIFLUCAN) 200 MG tablet Take 1 tablet (200 mg total) by mouth once a week. 8 tablet 0  . gabapentin (NEURONTIN) 100 MG capsule Take 3 capsules (300 mg total) by mouth at bedtime. (Patient taking differently: Take 200 mg by mouth at bedtime. ) 270 capsule 4  . glucose blood test strip Check daily. ICD 10 E11.9 100 each 12  . hydrochlorothiazide (HYDRODIURIL) 25 MG tablet Take 1 tablet (25 mg total) by mouth daily. 90 tablet 4  . metFORMIN (GLUCOPHAGE-XR) 500 MG 24 hr tablet Take 1 tablet (500 mg total) by mouth  2 (two) times daily. 180 tablet 4  . metoprolol succinate (TOPROL-XL) 50 MG 24 hr tablet Take 1 tablet (50 mg total) by mouth daily. Take with or immediately following a meal. 90 tablet 4  . omeprazole (PRILOSEC) 20 MG capsule Take 1 capsule (20 mg total) by mouth daily. 90 capsule 4  . vitamin C (ASCORBIC ACID) 250 MG tablet Take 2 tablets (500 mg total) by mouth daily. 60 tablet 2   Current Facility-Administered Medications on File Prior to Visit  Medication Dose Route Frequency Provider Last Rate Last Admin  . cyanocobalamin ((VITAMIN B-12)) injection 1,000 mcg  1,000 mcg Intramuscular Q30 days Kathrine Haddock, NP   1,000 mcg at 09/01/19 1529    Allergies  Allergen Reactions  . Contrast Media [Iodinated Diagnostic Agents] Itching  . Penicillins Itching    Has patient had a PCN reaction causing immediate rash, facial/tongue/throat swelling, SOB or lightheadedness with hypotension: Yes Has patient had a PCN reaction causing severe rash involving mucus membranes or skin necrosis: Unknown Has patient had a PCN reaction that required hospitalization: Yes Has patient had a PCN reaction occurring within the last 10 years: Yes If all of the above answers are "NO", then may proceed with Cephalosporin use.  . Sulfa Antibiotics Itching  . Latex Rash  . Neosporin [Neomycin-Bacitracin Zn-Polymyx] Itching and Rash  . Other Rash    Pt has reaction to high acidity foods.        Observations/Objective: There were no vitals filed for this visit. There is no height or weight on file to calculate BMI.  Physical Exam Constitutional:      General: She is not in acute distress. Neurological:     Mental Status: She is alert.     CBC    Component Value Date/Time   WBC 6.1 12/09/2019 1324   RBC 4.53 12/09/2019 1324   HGB 11.0 (L) 12/09/2019 1324   HGB 10.7 (L) 05/23/2019 1539   HCT 35.5 (L) 12/09/2019 1324   HCT 34.0 05/23/2019 1539   PLT 216 12/09/2019 1324   PLT 240 05/23/2019 1539   MCV  78.4 (L) 12/09/2019 1324   MCV 77 (L) 05/23/2019 1539   MCH 24.3 (L) 12/09/2019 1324   MCHC 31.0 12/09/2019 1324   RDW 15.7 (  H) 12/09/2019 1324   RDW 14.9 05/23/2019 1539   LYMPHSABS 1.8 12/09/2019 1324   LYMPHSABS 2.0 05/23/2019 1539   MONOABS 0.5 12/09/2019 1324   EOSABS 0.2 12/09/2019 1324   EOSABS 0.1 05/23/2019 1539   BASOSABS 0.0 12/09/2019 1324   BASOSABS 0.0 05/23/2019 1539    CMP     Component Value Date/Time   NA 140 12/09/2019 1324   NA 142 12/02/2019 1110   K 4.1 12/09/2019 1324   CL 101 12/09/2019 1324   CO2 29 12/09/2019 1324   GLUCOSE 98 12/09/2019 1324   BUN 16 12/09/2019 1324   BUN 13 12/02/2019 1110   CREATININE 0.84 12/09/2019 1324   CALCIUM 9.3 12/09/2019 1324   PROT 7.3 12/09/2019 1324   PROT 6.5 09/01/2019 1528   ALBUMIN 3.9 12/09/2019 1324   ALBUMIN 4.3 09/01/2019 1528   AST 21 12/09/2019 1324   ALT 21 12/09/2019 1324   ALKPHOS 56 12/09/2019 1324   BILITOT 0.6 12/09/2019 1324   BILITOT <0.2 09/01/2019 1528   GFRNONAA >60 12/09/2019 1324   GFRAA >60 12/09/2019 1324     Assessment and Plan: 1. Normocytic anemia   2. Thalassemia alpha carrier     #Anemia secondary to chronic kidney disease Her kidney function appears to be improving. Hemoglobin is improved at 11.  I recommend patient to continue take oral iron supplementation with Slow Fe once daily and vitamin C 500 mg daily to help with absorption. No need for IV iron at this point.  Chronic microcytosis secondary to alpha thalassemia trait.  No need for intervention at this point.  Follow Up Instructions: 6 months   I discussed the assessment and treatment plan with the patient. The patient was provided an opportunity to ask questions and all were answered. The patient agreed with the plan and demonstrated an understanding of the instructions.  The patient was advised to call back or seek an in-person evaluation if the symptoms worsen or if the condition fails to improve as anticipated.     Earlie Server, MD 12/13/2019 10:32 AM

## 2019-12-16 ENCOUNTER — Telehealth: Payer: Self-pay

## 2019-12-16 ENCOUNTER — Telehealth: Payer: Self-pay | Admitting: Nurse Practitioner

## 2019-12-16 NOTE — Chronic Care Management (AMB) (Signed)
  Care Management   Note  12/16/2019 Name: Tina Mcdonald MRN: [id number] DOB: Sep 11, 1940  Tina Mcdonald is a 79 y.o. year old female who is a primary care patient of [name], NP and is actively engaged with the care management team. I reached out to Tina Mcdonald by phone today to assist with re-scheduling a follow up visit with the RN Case Manager  Follow up plan: Telephone appointment with care management team member scheduled for:01/25/2020  Noreene Larsson, [place], [place], [place] [address] Direct Dial: [phone] [email or web address]

## 2020-01-09 ENCOUNTER — Telehealth (INDEPENDENT_AMBULATORY_CARE_PROVIDER_SITE_OTHER): Payer: Medicare Other | Admitting: Nurse Practitioner

## 2020-01-09 ENCOUNTER — Encounter: Payer: Self-pay | Admitting: Nurse Practitioner

## 2020-01-09 DIAGNOSIS — R197 Diarrhea, unspecified: Secondary | ICD-10-CM

## 2020-01-09 MED ORDER — METRONIDAZOLE 500 MG PO TABS
500.0000 mg | ORAL_TABLET | Freq: Three times a day (TID) | ORAL | 0 refills | Status: DC
Start: 2020-01-09 — End: 2020-02-07

## 2020-01-09 MED ORDER — CIPROFLOXACIN HCL 500 MG PO TABS
500.0000 mg | ORAL_TABLET | Freq: Two times a day (BID) | ORAL | 0 refills | Status: AC
Start: 2020-01-09 — End: 2020-01-16

## 2020-01-09 NOTE — Assessment & Plan Note (Addendum)
Acute with some improvement over past 3 days.  Suspect some diverticulitis based on symptoms and diet prior to episodes beginning.  Will obtain outpatient CBC, CMP, and stool sample.  Allergic to Penicillin, scripts for Cipro and Flagyl sent to treat x 7 days.  Discussed with patient and educated her on diet, foods to avoid.  Recommend she ensure good hydration and rest.  Monitor weight.  She is unable to attend a follow-up next week due to caring for her sister, but made her aware that if worsening or ongoing symptoms she will need to be seen ASAP.  At this time will plan on follow-up in 17 days.  She agrees with this plan of care and is able to verbalize back to provider.  If ongoing obtain imaging and send to GI.

## 2020-01-09 NOTE — Progress Notes (Signed)
LMP  (LMP Unknown)    Subjective:    Patient ID: Tina Mcdonald, female    DOB: 1941-03-26, 79 y.o.   MRN: [id number]  HPI: Tina Mcdonald is a 79 y.o. female  Chief Complaint  Patient presents with  . Diarrhea    pt states she has been having diarrhea for the past 3 weeks. States it has been better the last couple of days but states she has not eaten or drank anything.   . Emesis    pt states she has also been vomitting off and on since she has had the diarrhea, states she saw blood in her vomit this past Thursday    . This visit was completed via MyChart due to the restrictions of the COVID-19 pandemic. All issues as above were discussed and addressed. Physical exam was done as above through visual confirmation on MyChart. If it was felt that the patient should be evaluated in the office, they were directed there. The patient verbally consented to this visit. . Location of the patient: home . Location of the provider: work . Those involved with this call:  . Provider: Marnee Guarneri, DNP . CMA: Yvonna Alanis, CMA . Front Desk/Registration: Don Perking  . Time spent on call: 25 minutes with patient face to face via video conference. More than 50% of this time was spent in counseling and coordination of care. 15 minutes total spent in review of patient's record and preparation of their chart.  . I verified patient identity using two factors (patient name and date of birth). Patient consents verbally to being seen via telemedicine visit today.    DIARRHEA Presents today for complaint of diarrhea x 3 weeks, which has been improving over past couple days but has not been eating or drinking much nor is she taking her medication.  Reports the first 2 weeks it was every day bouts of diarrhea -- was having BM several times a day -- would go to bathroom and then an hour later have to go back.  Over past week has not had as many BM, mainly early in the morning and then 2-3 times  a day.  Over past few days has improved, had one BM Saturday and none yet yesterday and today -- yesterday she did eat one meal.  On Saturday her BM was diarrhea, not formed.  Does not take any stool softeners.  The first week of diarrhea she does endorse having chills and fever.  Prior to these episodes she had a pineapple and nut sundae -- then diarrhea started + had another pineapple nut sundae.    Denies any abdominal pain, does endorses some mild cramping.  Did have two episodes of emesis 1st week and then 2nd week she had one episode of vomiting -- she saw blood when she vomited that day, a pinkish "sliver of blood".  Has not seen any blood in her stool.    Last saw GI in November 2020 -- did have full work-up last year by GI due to anemia.  Did note diverticulosis of sigmoid colon in July 2020 on colonoscopy.  Does have her Covid vaccinations, received in February.  She wonders if it is because a different drug company is supplying medications now via [employer], which she reports is when symptoms started -- states two are red in color and if she took anything other then a white pill in past she would have GI issues. Status: slightly better Treatments attempted: none Fever: during 1st  week Nausea:  a couple bouts initially, none now Vomiting: a couple bouts initially, none now Weight loss: 3 pounds Decreased appetite: no Diarrhea: yes Constipation: no Blood in stool: no Heartburn: no Jaundice: no Rash: no Dysuria/urinary frequency: no Hematuria: no History of sexually transmitted disease: no Recurrent NSAID use: no  Relevant past medical, surgical, family and social history reviewed and updated as indicated. Interim medical history since our last visit reviewed. Allergies and medications reviewed and updated.  Review of Systems  Constitutional: Negative for activity change, appetite change, diaphoresis, fatigue and fever.  Respiratory: Negative for cough, chest tightness and shortness  of breath.   Cardiovascular: Negative for chest pain, palpitations and leg swelling.  Gastrointestinal: Positive for diarrhea, nausea and vomiting. Negative for abdominal distention, abdominal pain, anal bleeding, blood in stool, constipation and rectal pain.  Endocrine: Negative for cold intolerance and heat intolerance.  Neurological: Negative for dizziness, syncope, weakness, light-headedness, numbness and headaches.  Psychiatric/Behavioral: Negative.     Per HPI unless specifically indicated above     Objective:    LMP  (LMP Unknown)   Wt Readings from Last 3 Encounters:  12/02/19 165 lb (74.8 kg)  09/24/19 163 lb (73.9 kg)  04/19/19 167 lb (75.8 kg)    Physical Exam Vitals and nursing note reviewed.  Constitutional:      General: She is awake. She is not in acute distress.    Appearance: She is well-developed and well-groomed. She is not ill-appearing.     Comments: Very talkative and very good spirits  HENT:     Head: Normocephalic.     Right Ear: Hearing normal.     Left Ear: Hearing normal.  Eyes:     General: Lids are normal.        Right eye: No discharge.        Left eye: No discharge.     Conjunctiva/sclera: Conjunctivae normal.  Pulmonary:     Effort: Pulmonary effort is normal. No accessory muscle usage or respiratory distress.  Abdominal:     General: There is no distension.     Tenderness: There is no abdominal tenderness.     Comments: Unable to auscultate due to virtual exam only.  She denies pain with palpation on self.   Musculoskeletal:     Cervical back: Normal range of motion.  Neurological:     Mental Status: She is alert and oriented to person, place, and time.  Psychiatric:        Attention and Perception: Attention normal.        Mood and Affect: Mood normal.        Behavior: Behavior normal. Behavior is cooperative.        Thought Content: Thought content normal.        Judgment: Judgment normal.     Results for orders placed or  performed in visit on 12/09/19  Iron and TIBC  Result Value Ref Range   Iron 40 28 - 170 ug/dL   TIBC 290 250 - 450 ug/dL   Saturation Ratios 14 10.4 - 31.8 %   UIBC 250 ug/dL  Ferritin  Result Value Ref Range   Ferritin 87 11 - 307 ng/mL  Comprehensive metabolic panel  Result Value Ref Range   Sodium 140 135 - 145 mmol/L   Potassium 4.1 3.5 - 5.1 mmol/L   Chloride 101 98 - 111 mmol/L   CO2 29 22 - 32 mmol/L   Glucose, Bld 98 70 - 99 mg/dL  BUN 16 8 - 23 mg/dL   Creatinine, Ser 0.84 0.44 - 1.00 mg/dL   Calcium 9.3 8.9 - 10.3 mg/dL   Total Protein 7.3 6.5 - 8.1 g/dL   Albumin 3.9 3.5 - 5.0 g/dL   AST 21 15 - 41 U/L   ALT 21 0 - 44 U/L   Alkaline Phosphatase 56 38 - 126 U/L   Total Bilirubin 0.6 0.3 - 1.2 mg/dL   GFR calc non Af Amer >60 >60 mL/min   GFR calc Af Amer >60 >60 mL/min   Anion gap 10 5 - 15  CBC with Differential  Result Value Ref Range   WBC 6.1 4.0 - 10.5 K/uL   RBC 4.53 3.87 - 5.11 MIL/uL   Hemoglobin 11.0 (L) 12.0 - 15.0 g/dL   HCT 35.5 (L) 36 - 46 %   MCV 78.4 (L) 80.0 - 100.0 fL   MCH 24.3 (L) 26.0 - 34.0 pg   MCHC 31.0 30.0 - 36.0 g/dL   RDW 15.7 (H) 11.5 - 15.5 %   Platelets 216 150 - 400 K/uL   nRBC 0.0 0.0 - 0.2 %   Neutrophils Relative % 59 %   Neutro Abs 3.5 1.7 - 7.7 K/uL   Lymphocytes Relative 30 %   Lymphs Abs 1.8 0.7 - 4.0 K/uL   Monocytes Relative 8 %   Monocytes Absolute 0.5 0 - 1 K/uL   Eosinophils Relative 3 %   Eosinophils Absolute 0.2 0 - 0 K/uL   Basophils Relative 0 %   Basophils Absolute 0.0 0 - 0 K/uL   Immature Granulocytes 0 %   Abs Immature Granulocytes 0.02 0.00 - 0.07 K/uL      Assessment & Plan:   Problem List Items Addressed This Visit      Other   Diarrhea - Primary    Acute with some improvement over past 3 days.  Suspect some diverticulitis based on symptoms and diet prior to episodes beginning.  Will obtain outpatient CBC, CMP, and stool sample.  Allergic to Penicillin, scripts for Cipro and Flagyl sent to  treat x 7 days.  Discussed with patient and educated her on diet, foods to avoid.  Recommend she ensure good hydration and rest.  Monitor weight.  She is unable to attend a follow-up next week due to caring for her sister, but made her aware that if worsening or ongoing symptoms she will need to be seen ASAP.  At this time will plan on follow-up in 17 days.  She agrees with this plan of care and is able to verbalize back to provider.  If ongoing obtain imaging and send to GI.      Relevant Orders   CBC with Differential/Platelet   Cdiff NAA+O+P+Stool Culture   Comprehensive metabolic panel      I discussed the assessment and treatment plan with the patient. The patient was provided an opportunity to ask questions and all were answered. The patient agreed with the plan and demonstrated an understanding of the instructions.   The patient was advised to call back or seek an in-person evaluation if the symptoms worsen or if the condition fails to improve as anticipated.   I provided 21+ minutes of time during this encounter.  Follow up plan: Return in about 17 days (around 01/26/2020) for Diarrhea.

## 2020-01-09 NOTE — Patient Instructions (Signed)

## 2020-01-11 ENCOUNTER — Ambulatory Visit (INDEPENDENT_AMBULATORY_CARE_PROVIDER_SITE_OTHER): Payer: Medicare Other | Admitting: Pharmacist

## 2020-01-11 DIAGNOSIS — R197 Diarrhea, unspecified: Secondary | ICD-10-CM

## 2020-01-11 DIAGNOSIS — E785 Hyperlipidemia, unspecified: Secondary | ICD-10-CM | POA: Diagnosis not present

## 2020-01-11 DIAGNOSIS — D508 Other iron deficiency anemias: Secondary | ICD-10-CM | POA: Diagnosis not present

## 2020-01-11 DIAGNOSIS — M159 Polyosteoarthritis, unspecified: Secondary | ICD-10-CM

## 2020-01-11 DIAGNOSIS — E1169 Type 2 diabetes mellitus with other specified complication: Secondary | ICD-10-CM

## 2020-01-11 DIAGNOSIS — N183 Chronic kidney disease, stage 3 unspecified: Secondary | ICD-10-CM

## 2020-01-11 DIAGNOSIS — E1122 Type 2 diabetes mellitus with diabetic chronic kidney disease: Secondary | ICD-10-CM

## 2020-01-11 DIAGNOSIS — M8949 Other hypertrophic osteoarthropathy, multiple sites: Secondary | ICD-10-CM

## 2020-01-11 NOTE — Patient Instructions (Signed)
Ms. Keeven,   It was great talking with you today! Keep up the fantastic work with your health!  See your appointment list on the next page regarding your phone call from our nurse case manager, Jeannene Patella, on August 4th. I have asked her to call from the clinic phone, but in case she can't, her number is [phone]- it's the same number the previous nurse, Janci, called you from.   See about purchasing an arm blood pressure cuff from the [facility]. When you check your blood pressure, make sure you are sitting still with your feet flat on the floor for at least 5 minutes before. In general, we'd like to see the top number (systolic) be less than 025, and less than 130 is even better.   Please call us with any questions or concerns! Take care,   Catie Darnelle Maffucci, PharmD [phone]   Visit Information  Goals Addressed              This Visit's Progress     Patient Stated   .  PharmD "I want to take care of my medications" (pt-stated)        CARE PLAN ENTRY (see longtitudinal plan of care for additional care plan information)  Current Barriers:  . Polypharmacy; complex patient with multiple comorbidities including T2DM, HTN, anemia, CKD, OA, GERD o S/p visit on Monday for diarrhea. Notes it has improved somewhat, but she has not had a chance to go by the pharmacy yet to pick up metronidazole/cipro, as she been taking of her sister after having surgery on both eyes last week. Notes that PCP ordered labwork for her, but she has not been contacted to schedule this or f/u with PCP . Self-manages medications.  o Anemia: slow FE + Vitamin C 500 mg daily; reports that she is taking 2 250 mg tabs, wonders if she can take 1 500 mg tab o T2DM; controlled, last A1c 6.2%; metformin 500 mg BID; o ASCVD risk reduction:  - HTN: HCTZ 25 mg daily, clonidine 0.1 mg BID, metoprolol succinate 50 mg daily; is not checking BP at home as she does not have a cuff; wonders if this  is something she can purchase from [employer] - HLD: atorvastatin 80 mg daily, except Saturdays when she takes fluconazole as per Dr. Nicole Kindred, dermatology; last LDL not at goal <100, however, she had been holding atorvastatin during concurrent fluconazole therapy. Now sh o GERD: omeprazole 20 mg daily  o OA: gabapentin 200 mg QPM, diclofenac gel PRN to knees. Acetaminophen 650 mg PRN. Wonders how often she can combine APAP and gabapentin o Nail fungus: fluconazole 200 mg every Saturday, ciclopirox lacquer. Has f/u with Dr. Nicole Kindred in mid August, but only has 1 more pill of fluconazole. Wonders if she is to continue therapy  o Vitamin B 12 deficiency. Notes she needs to schedule her injection for this month  Pharmacist Clinical Goal(s):  [name] Over the next 90 days, patient will work with PharmD and provider towards optimized medication management  Interventions: . Comprehensive medication review performed, medication list updated in electronic medical record . Inter-disciplinary care team collaboration (see longitudinal plan of care) . Praised for maintenance of goal A1c. Encouraged continued focus on adherence to medications and a low carbohydrate diet.  . Discussed goal LDL <70 given DM and ASCVD risk factors. Atorvastatin dosing has not been regular over the past few months d/t concurrent fluconazole therapy. Recommend recheck in a few months, now  that patient is taking atorvastaitn 80 mg 6 days per week.  . Reviewed proper BP checking technique. Discussed Over the Counter benefits through [facility], encouraged patient to pursue purchasing an arm BP cuff with these benefits. Patient verbalized understanding . Discussed max daily APAP dose of 4000 mg daily. Discussed that she can combine w/ gabapentin, or she can take APAP during the day and gabapentin at night to avoid daytime sedation. Recommended she avoid APAP PM.  Patient verbalized understanding.  . Will collaborate w/ PCP and clinical  staff regarding plan for patient to schedule lab work, PCP f/u, and Vitamin B12 injection . Will message Dr. Nicole Kindred to see if patient is to continue fluconazole until she sees her for f/u on 02/07/20, and if so, to request a refill be sent to [facility]. . Reviewed role of RN CM on team and discussed upcoming outreach appointment .   Patient Self Care Activities:  . Patient will take medications as prescribed  Please see past updates related to this goal by clicking on the "Past Updates" button in the selected goal         The patient verbalized understanding of instructions provided today and agreed to receive a mailed copy of patient instruction and/or educational materials.   Plan:  - Will defer scheduling PharmD f/u at this time, as patient has CCM f/u scheduled w/ RN CM next month.   Catie Darnelle Maffucci, PharmD, [place] [phone]

## 2020-01-11 NOTE — Chronic Care Management (AMB) (Addendum)
Chronic Care Management   Follow Up Note   01/11/2020 Name: Tina Mcdonald MRN: [id number] DOB: 1940-09-06  Referred by: [name], NP Reason for referral : Chronic Care Management (Medication Management)   Tina Mcdonald is a 79 y.o. year old female who is a primary care patient of [name], [name], NP. The CCM team was consulted for assistance with chronic disease management and care coordination needs.    Contacted patient for medication management review.   Review of patient status, including review of consultants reports, relevant laboratory and other test results, and collaboration with appropriate care team members and the patient's provider was performed as part of comprehensive patient evaluation and provision of chronic care management services.    SDOH (Social Determinants of Health) assessments performed: No See Care Plan activities for detailed interventions related to [facility])     Outpatient Encounter Medications as of 01/11/2020  Medication Sig Note   acetaminophen (TYLENOL) 325 MG tablet Take 650 mg by mouth every 6 (six) hours as needed.    atorvastatin (LIPITOR) 80 MG tablet Take 1 tablet (80 mg total) by mouth daily. 01/11/2020: Holding on day that she takes fluconazole   blood glucose meter kit and supplies KIT Dispense based on patient and insurance preference. Use up to four times daily as directed. (FOR ICD-9 250.00, 250.01).    ciclopirox (PENLAC) 8 % solution Apply topically at bedtime. Apply over nail and surrounding skin. Apply daily over previous coat. After seven (7) days, may remove with alcohol and continue cycle.    cloNIDine (CATAPRES) 0.1 MG tablet Take 0.1 mg by mouth 2 (two) times daily.    diclofenac sodium (VOLTAREN) 1 % GEL Apply 2 g topically 2 (two) times daily as needed.    Ferrous Sulfate (SLOW FE PO) Take 1 tablet by mouth daily.    fluconazole (DIFLUCAN) 200 MG tablet Take 1 tablet (200 mg total) by mouth once a week.     gabapentin (NEURONTIN) 100 MG capsule Take 3 capsules (300 mg total) by mouth at bedtime. (Patient taking differently: Take 200 mg by mouth at bedtime. )    glucose blood test strip Check daily. ICD 10 E11.9    hydrochlorothiazide (HYDRODIURIL) 25 MG tablet Take 1 tablet (25 mg total) by mouth daily.    metFORMIN (GLUCOPHAGE-XR) 500 MG 24 hr tablet Take 1 tablet (500 mg total) by mouth 2 (two) times daily.    metoprolol succinate (TOPROL-XL) 50 MG 24 hr tablet Take 1 tablet (50 mg total) by mouth daily. Take with or immediately following a meal.    omeprazole (PRILOSEC) 20 MG capsule Take 1 capsule (20 mg total) by mouth daily.    vitamin C (ASCORBIC ACID) 250 MG tablet Take 2 tablets (500 mg total) by mouth daily.    ciprofloxacin (CIPRO) 500 MG tablet Take 1 tablet (500 mg total) by mouth 2 (two) times daily for 7 days. (Patient not taking: Reported on 01/11/2020)    metroNIDAZOLE (FLAGYL) 500 MG tablet Take 1 tablet (500 mg total) by mouth 3 (three) times daily. (Patient not taking: Reported on 01/11/2020)    Facility-Administered Encounter Medications as of 01/11/2020  Medication   cyanocobalamin ((VITAMIN B-12)) injection 1,000 mcg     Objective:   Goals Addressed              This Visit's Progress     Patient Stated     PharmD "I want to take care of my medications" (pt-stated)  CARE PLAN ENTRY (see longtitudinal plan of care for additional care plan information)  Current Barriers:   Polypharmacy; complex patient with multiple comorbidities including T2DM, HTN, anemia, CKD, OA, GERD o S/p visit on Monday for diarrhea. Notes it has improved somewhat, but she has not had a chance to go by the pharmacy yet to pick up metronidazole/cipro, as she been taking of her sister after having surgery on both eyes last week. Notes that PCP ordered labwork for her, but she has not been contacted to schedule this or f/u with PCP. She is unsure if she is supposed to have labwork  now, or when she next sees PCP.  Self-manages medications.  o Anemia: slow FE + Vitamin C 500 mg daily; reports that she is taking 2 250 mg tabs, wonders if she can take 1 500 mg tab o T2DM; controlled, last A1c 6.2%; metformin 500 mg BID; o ASCVD risk reduction:  - HTN: HCTZ 25 mg daily, clonidine 0.1 mg BID, metoprolol succinate 50 mg daily; is not checking BP at home as she does not have a cuff; wonders if this is something she can purchase from [employer] - HLD: atorvastatin 80 mg daily, except Saturdays when she takes fluconazole as per Dr. Nicole Kindred, dermatology; last LDL not at goal <100, however, she had been holding atorvastatin during concurrent fluconazole therapy. Now sh o GERD: omeprazole 20 mg daily  o OA: gabapentin 200 mg QPM, diclofenac gel PRN to knees. Acetaminophen 650 mg PRN. Wonders how often she can combine APAP and gabapentin o Nail fungus: fluconazole 200 mg every Saturday, ciclopirox lacquer. Has f/u with Dr. Nicole Kindred in mid August, but only has 1 more pill of fluconazole. Wonders if she is to continue therapy  o Vitamin B 12 deficiency. Notes she needs to schedule her injection for this month  Pharmacist Clinical Goal(s):   Over the next 90 days, patient will work with PharmD and provider towards optimized medication management  Interventions:  Comprehensive medication review performed, medication list updated in electronic medical record  Inter-disciplinary care team collaboration (see longitudinal plan of care)  Praised for maintenance of goal A1c. Encouraged continued focus on adherence to medications and a low carbohydrate diet.   Discussed goal LDL <70 given DM and ASCVD risk factors. Atorvastatin dosing has not been regular over the past few months d/t concurrent fluconazole therapy. Recommend recheck in a few months, now that patient is taking atorvastaitn 80 mg 6 days per week.   Reviewed proper BP checking technique. Discussed Over the  Counter benefits through [facility], encouraged patient to pursue purchasing an arm BP cuff with these benefits. Patient verbalized understanding  Discussed max daily APAP dose of 4000 mg daily. Discussed that she can combine w/ gabapentin, or she can take APAP during the day and gabapentin at night to avoid daytime sedation. Recommended she avoid APAP PM.  Patient verbalized understanding.   Will collaborate w/ PCP and clinical staff regarding plan for patient to schedule lab work, PCP f/u, and Vitamin B12 injection  Will message Dr. Nicole Kindred to see if patient is to continue fluconazole until she sees her for f/u on 02/07/20, and if so, to request a refill be sent to [facility]  Reviewed role of RN CM on team and discussed upcoming outreach appointment  Patient Self Care Activities:   Patient will take medications as prescribed  Please see past updates related to this goal by clicking on the "Past Updates" button in the selected goal  Plan:  - Will defer scheduling PharmD f/u at this time, as patient has CCM f/u scheduled w/ RN CM next month.   Catie Darnelle Maffucci, PharmD, [place] [phone]

## 2020-01-12 ENCOUNTER — Ambulatory Visit: Payer: Medicare Other

## 2020-01-12 ENCOUNTER — Telehealth: Payer: Self-pay

## 2020-01-12 ENCOUNTER — Other Ambulatory Visit: Payer: Self-pay

## 2020-01-12 ENCOUNTER — Other Ambulatory Visit: Payer: Medicare Other

## 2020-01-12 DIAGNOSIS — R197 Diarrhea, unspecified: Secondary | ICD-10-CM

## 2020-01-12 MED ORDER — FLUCONAZOLE 200 MG PO TABS
ORAL_TABLET | ORAL | 0 refills | Status: DC
Start: 2020-01-12 — End: 2020-02-07

## 2020-01-12 NOTE — Progress Notes (Signed)
Okay erx'd Diflucan 200 mg 1 tablet a week #4  0 RF to [facility], pt informed

## 2020-01-12 NOTE — Telephone Encounter (Signed)
Fluconazole 200 mg #4 erx'd to [facility]

## 2020-01-13 LAB — COMPREHENSIVE METABOLIC PANEL
ALT: 14 IU/L (ref 0–32)
AST: 16 IU/L (ref 0–40)
Albumin/Globulin Ratio: 1.8 (ref 1.2–2.2)
Albumin: 4.1 g/dL (ref 3.7–4.7)
Alkaline Phosphatase: 68 IU/L (ref 48–121)
BUN/Creatinine Ratio: 20 (ref 12–28)
BUN: 19 mg/dL (ref 8–27)
Bilirubin Total: 0.2 mg/dL (ref 0.0–1.2)
CO2: 24 mmol/L (ref 20–29)
Calcium: 9.7 mg/dL (ref 8.7–10.3)
Chloride: 107 mmol/L — ABNORMAL HIGH (ref 96–106)
Creatinine, Ser: 0.93 mg/dL (ref 0.57–1.00)
GFR calc Af Amer: 69 mL/min/{1.73_m2} (ref >59)
GFR calc non Af Amer: 59 mL/min/{1.73_m2} — ABNORMAL LOW (ref >59)
Globulin, Total: 2.3 g/dL (ref 1.5–4.5)
Glucose: 93 mg/dL (ref 65–99)
Potassium: 4.3 mmol/L (ref 3.5–5.2)
Sodium: 144 mmol/L (ref 134–144)
Total Protein: 6.4 g/dL (ref 6.0–8.5)

## 2020-01-13 LAB — CBC WITH DIFFERENTIAL/PLATELET
Basophils Absolute: 0 10*3/uL (ref 0.0–0.2)
Basos: 0 %
EOS (ABSOLUTE): 0.2 10*3/uL (ref 0.0–0.4)
Eos: 3 %
Hematocrit: 34.6 % (ref 34.0–46.6)
Hemoglobin: 10.8 g/dL — ABNORMAL LOW (ref 11.1–15.9)
Immature Grans (Abs): 0 10*3/uL (ref 0.0–0.1)
Immature Granulocytes: 0 %
Lymphocytes Absolute: 2 10*3/uL (ref 0.7–3.1)
Lymphs: 37 %
MCH: 24.2 pg — ABNORMAL LOW (ref 26.6–33.0)
MCHC: 31.2 g/dL — ABNORMAL LOW (ref 31.5–35.7)
MCV: 78 fL — ABNORMAL LOW (ref 79–97)
Monocytes Absolute: 0.4 10*3/uL (ref 0.1–0.9)
Monocytes: 8 %
Neutrophils Absolute: 2.8 10*3/uL (ref 1.4–7.0)
Neutrophils: 52 %
Platelets: 220 10*3/uL (ref 150–450)
RBC: 4.46 x10E6/uL (ref 3.77–5.28)
RDW: 15.4 % (ref 11.7–15.4)
WBC: 5.5 10*3/uL (ref 3.4–10.8)

## 2020-01-13 NOTE — Progress Notes (Signed)
Please let Tina Mcdonald know kidney and liver function look good.  She continues to show some mild anemia, continue to follow with her hematology provider as scheduled.  No signs of infection on labs.  Waiting on stool sample and will alert her when this returns.  Have a great day!!

## 2020-01-20 ENCOUNTER — Telehealth: Payer: Self-pay | Admitting: Nurse Practitioner

## 2020-01-20 NOTE — Telephone Encounter (Signed)
Copied from [place] [id number]. Topic: General - Other >> Jan 20, 2020  2:13 PM Leward Quan A wrote: Reason for CRM: Patient called to inquire about her metFORMIN (GLUCOPHAGE-XR) 500 MG 24 hr tablet stating that as far back as she can remember she have been taking 2 tabs twice a day but her new Rx have her taking one tab twice a day. She just wanted clarification from a nurse Please advise Ph# [phone]

## 2020-01-20 NOTE — Telephone Encounter (Signed)
Routing to provider  

## 2020-01-23 ENCOUNTER — Ambulatory Visit (INDEPENDENT_AMBULATORY_CARE_PROVIDER_SITE_OTHER): Payer: Medicare Other | Admitting: General Practice

## 2020-01-23 DIAGNOSIS — R059 Cough, unspecified: Secondary | ICD-10-CM

## 2020-01-23 DIAGNOSIS — I152 Hypertension secondary to endocrine disorders: Secondary | ICD-10-CM

## 2020-01-23 DIAGNOSIS — E785 Hyperlipidemia, unspecified: Secondary | ICD-10-CM

## 2020-01-23 DIAGNOSIS — E1159 Type 2 diabetes mellitus with other circulatory complications: Secondary | ICD-10-CM

## 2020-01-23 DIAGNOSIS — R05 Cough: Secondary | ICD-10-CM

## 2020-01-23 DIAGNOSIS — E1169 Type 2 diabetes mellitus with other specified complication: Secondary | ICD-10-CM

## 2020-01-23 DIAGNOSIS — R0602 Shortness of breath: Secondary | ICD-10-CM

## 2020-01-23 DIAGNOSIS — E1122 Type 2 diabetes mellitus with diabetic chronic kidney disease: Secondary | ICD-10-CM

## 2020-01-23 NOTE — Patient Instructions (Signed)
Visit Information  Goals Addressed              This Visit's Progress   .  RNCM: Chronic Disease Management and Care Coordination        CARE PLAN ENTRY (see longtitudinal plan of care for additional care plan information)  Current Barriers:  . Chronic Disease Management support, education, and care coordination needs related to HTN, HLD, DMII, and CKD Stage 3  Clinical Goal(s) related to HTN, HLD, DMII, and CKD Stage 3 :  Over the next 120 days, patient will:  . Work with the care management team to address educational, disease management, and care coordination needs  . Begin or continue self health monitoring activities as directed today Measure and record cbg (blood glucose) 1 times daily, Measure and record blood pressure 2/3 times per week, and adhere to a heart healthy/ADA diet  . Call provider office for new or worsened signs and symptoms Blood glucose findings outside established parameters, Blood pressure findings outside established parameters, Shortness of breath, and New or worsened symptom related to HLD/CKD and other chronic conditions . Call care management team with questions or concerns . Verbalize basic understanding of patient centered plan of care established today  Interventions related to HTN, HLD, DMII, and CKD Stage 3 :  . Evaluation of current treatment plans and patient's adherence to plan as established by provider . Assessed patient understanding of disease states . Assessed patient's education and care coordination needs . Provided disease specific education to patient  . Collaborated with appropriate clinical care team members regarding patient needs  Patient Self Care Activities related to HTN, HLD, DMII, and CKD Stage 3 :  . Patient is unable to independently self-manage chronic health conditions  Initial goal documentation     .  RNCM: Pt-"I don't know if I have COVID or not" (pt-stated)        CARE PLAN ENTRY (see longitudinal plan of care for  additional care plan information)  Current Barriers:  [name] Knowledge Deficits related to productive cough and being sick all weekend . Care Coordination needs related to not knowing what to do for sx/sx in a patient with possibly an acute respiratory infection vs COVID 19 per the patient  (disease states)  Nurse Case Manager Clinical Goal(s):  [name] Over the next 30 days, patient will verbalize understanding of plan for finding etiology and treatment of current acute respiratory issue . Over the next 30 days, patient will demonstrate a decrease in respiratory exacerbations as evidenced by resolved sx/sx of respiratory infection/?COVID/or other illness as expressed by the patient.   Interventions:  . Inter-disciplinary care team collaboration (see longitudinal plan of care) . Evaluation of current treatment plan related to current treatment at home for sx/sx of respiratory issues and patient's adherence to plan as established by provider. . Advised patient to seek care at the urgent care due to issues at the office with Internet connections and her pcp being out of town.  . Provided education to patient re: being evaluated to determine the source of her sx/sx: The patient is reporting productive cough and being sick over the weekend. Is having a harder time at night when she lays down to go to sleep.  . Discussed plans with patient for ongoing care management follow up and provided patient with direct contact information for care management team  Patient Self Care Activities:  . Patient verbalizes understanding of plan to seek care at an urgent care for acute sx/sx of  upper respiratory infection vs ?COVID19 . Calls provider office for new concerns or questions . Unable to independently determine what is causing her sx/sx and change in respiratory status and questioning if she possibly has COVID 19 and should be tested.   Initial goal documentation        Patient verbalizes understanding of  instructions provided today.   Telephone follow up appointment with care management team member scheduled for: 01-25-2020 at 39 am  Noreene Larsson RN, MSN, [facility] Mobile: [phone]

## 2020-01-23 NOTE — Chronic Care Management (AMB) (Signed)
Chronic Care Management   Follow Up Note   01/23/2020 Name: Tina Mcdonald MRN: [id number] DOB: 04/30/1941  Referred by: [name], NP Reason for referral : Chronic Care Management (RNCM returned call after 2 VM- Chronic Disease Management and Care Coordination Needs)   Tina Mcdonald is a 79 y.o. year old female who is a primary care patient of [name], [name], NP. The CCM team was consulted for assistance with chronic disease management and care coordination needs.    Review of patient status, including review of consultants reports, relevant laboratory and other test results, and collaboration with appropriate care team members and the patient's provider was performed as part of comprehensive patient evaluation and provision of chronic care management services.    SDOH (Social Determinants of Health) assessments performed: Yes See Care Plan activities for detailed interventions related to [facility])     Outpatient Encounter Medications as of 01/23/2020  Medication Sig Note  . acetaminophen (TYLENOL) 325 MG tablet Take 650 mg by mouth every 6 (six) hours as needed.   [name] atorvastatin (LIPITOR) 80 MG tablet Take 1 tablet (80 mg total) by mouth daily. 01/11/2020: Holding on day that she takes fluconazole  . blood glucose meter kit and supplies KIT Dispense based on patient and insurance preference. Use up to four times daily as directed. (FOR ICD-9 250.00, 250.01).   . ciclopirox (PENLAC) 8 % solution Apply topically at bedtime. Apply over nail and surrounding skin. Apply daily over previous coat. After seven (7) days, may remove with alcohol and continue cycle.   . cloNIDine (CATAPRES) 0.1 MG tablet Take 0.1 mg by mouth 2 (two) times daily.   . diclofenac sodium (VOLTAREN) 1 % GEL Apply 2 g topically 2 (two) times daily as needed.   . Ferrous Sulfate (SLOW FE PO) Take 1 tablet by mouth daily.   . fluconazole (DIFLUCAN) 200 MG tablet Take 1 tablet (200 mg total) by mouth once a week.     . fluconazole (DIFLUCAN) 200 MG tablet Take 1 tablet once a week   . gabapentin (NEURONTIN) 100 MG capsule Take 3 capsules (300 mg total) by mouth at bedtime. (Patient taking differently: Take 200 mg by mouth at bedtime. )   . glucose blood test strip Check daily. ICD 10 E11.9   . hydrochlorothiazide (HYDRODIURIL) 25 MG tablet Take 1 tablet (25 mg total) by mouth daily.   . metFORMIN (GLUCOPHAGE-XR) 500 MG 24 hr tablet Take 1 tablet (500 mg total) by mouth 2 (two) times daily.   . metoprolol succinate (TOPROL-XL) 50 MG 24 hr tablet Take 1 tablet (50 mg total) by mouth daily. Take with or immediately following a meal.   . metroNIDAZOLE (FLAGYL) 500 MG tablet Take 1 tablet (500 mg total) by mouth 3 (three) times daily. (Patient not taking: Reported on 01/11/2020)   . omeprazole (PRILOSEC) 20 MG capsule Take 1 capsule (20 mg total) by mouth daily.   . vitamin C (ASCORBIC ACID) 250 MG tablet Take 2 tablets (500 mg total) by mouth daily.    Facility-Administered Encounter Medications as of 01/23/2020  Medication  . cyanocobalamin ((VITAMIN B-12)) injection 1,000 mcg     Objective: BP Readings from Last 3 Encounters:  12/02/19 123/80  09/24/19 (!) 198/82  04/19/19 (!) 155/90   Lab Results  Component Value Date   HGBA1C 6.2 12/02/2019    Goals Addressed              This Visit's Progress   .  RNCM: Chronic Disease Management and Care Coordination        CARE PLAN ENTRY (see longtitudinal plan of care for additional care plan information)  Current Barriers:  . Chronic Disease Management support, education, and care coordination needs related to HTN, HLD, DMII, and CKD Stage 3  Clinical Goal(s) related to HTN, HLD, DMII, and CKD Stage 3 :  Over the next 120 days, patient will:  . Work with the care management team to address educational, disease management, and care coordination needs  . Begin or continue self health monitoring activities as directed today Measure and record cbg  (blood glucose) 1 times daily, Measure and record blood pressure 2/3 times per week, and adhere to a heart healthy/ADA diet  . Call provider office for new or worsened signs and symptoms Blood glucose findings outside established parameters, Blood pressure findings outside established parameters, Shortness of breath, and New or worsened symptom related to HLD/CKD and other chronic conditions . Call care management team with questions or concerns . Verbalize basic understanding of patient centered plan of care established today  Interventions related to HTN, HLD, DMII, and CKD Stage 3 :  . Evaluation of current treatment plans and patient's adherence to plan as established by provider . Assessed patient understanding of disease states . Assessed patient's education and care coordination needs . Provided disease specific education to patient  . Collaborated with appropriate clinical care team members regarding patient needs  Patient Self Care Activities related to HTN, HLD, DMII, and CKD Stage 3 :  . Patient is unable to independently self-manage chronic health conditions  Initial goal documentation     .  RNCM: Pt-"I don't know if I have COVID or not" (pt-stated)        CARE PLAN ENTRY (see longitudinal plan of care for additional care plan information)  Current Barriers:  [name] Knowledge Deficits related to productive cough and being sick all weekend . Care Coordination needs related to not knowing what to do for sx/sx in a patient with possibly an acute respiratory infection vs COVID 19 per the patient  (disease states)  Nurse Case Manager Clinical Goal(s):  [name] Over the next 30 days, patient will verbalize understanding of plan for finding etiology and treatment of current acute respiratory issue . Over the next 30 days, patient will demonstrate a decrease in respiratory exacerbations as evidenced by resolved sx/sx of respiratory infection/?COVID/or other illness as expressed by the patient.     Interventions:  . Inter-disciplinary care team collaboration (see longitudinal plan of care) . Evaluation of current treatment plan related to current treatment at home for sx/sx of respiratory issues and patient's adherence to plan as established by provider. . Advised patient to seek care at the urgent care due to issues at the office with Internet connections and her pcp being out of town.  . Provided education to patient re: being evaluated to determine the source of her sx/sx: The patient is reporting productive cough and being sick over the weekend. Is having a harder time at night when she lays down to go to sleep.  . Discussed plans with patient for ongoing care management follow up and provided patient with direct contact information for care management team  Patient Self Care Activities:  . Patient verbalizes understanding of plan to seek care at an urgent care for acute sx/sx of upper respiratory infection vs ?COVID19 . Calls provider office for new concerns or questions . Unable to independently determine what is causing her  sx/sx and change in respiratory status and questioning if she possibly has COVID 19 and should be tested.   Initial goal documentation         Plan:   Telephone follow up appointment with care management team member scheduled for: 01-25-2020 at [place], MSN, [facility] Mobile: [phone]

## 2020-01-24 NOTE — Telephone Encounter (Signed)
Called patient and a lady answer the phone. She stated pt will return call tomorrow morning.

## 2020-01-25 ENCOUNTER — Ambulatory Visit: Payer: Self-pay | Admitting: General Practice

## 2020-01-25 ENCOUNTER — Telehealth: Payer: Self-pay | Admitting: General Practice

## 2020-01-25 DIAGNOSIS — E1169 Type 2 diabetes mellitus with other specified complication: Secondary | ICD-10-CM | POA: Diagnosis not present

## 2020-01-25 DIAGNOSIS — I10 Essential (primary) hypertension: Secondary | ICD-10-CM | POA: Diagnosis not present

## 2020-01-25 DIAGNOSIS — E1121 Type 2 diabetes mellitus with diabetic nephropathy: Secondary | ICD-10-CM

## 2020-01-25 DIAGNOSIS — N1831 Chronic kidney disease, stage 3a: Secondary | ICD-10-CM

## 2020-01-25 DIAGNOSIS — E785 Hyperlipidemia, unspecified: Secondary | ICD-10-CM

## 2020-01-25 DIAGNOSIS — R059 Cough, unspecified: Secondary | ICD-10-CM

## 2020-01-25 DIAGNOSIS — E1159 Type 2 diabetes mellitus with other circulatory complications: Secondary | ICD-10-CM

## 2020-01-25 DIAGNOSIS — R0602 Shortness of breath: Secondary | ICD-10-CM

## 2020-01-25 DIAGNOSIS — R197 Diarrhea, unspecified: Secondary | ICD-10-CM

## 2020-01-25 DIAGNOSIS — N183 Chronic kidney disease, stage 3 unspecified: Secondary | ICD-10-CM

## 2020-01-25 DIAGNOSIS — E1122 Type 2 diabetes mellitus with diabetic chronic kidney disease: Secondary | ICD-10-CM

## 2020-01-25 DIAGNOSIS — I152 Hypertension secondary to endocrine disorders: Secondary | ICD-10-CM

## 2020-01-25 NOTE — Telephone Encounter (Signed)
Patient called back and notified of Cheryl's massage. Patient verbalized understanding

## 2020-01-25 NOTE — Patient Instructions (Signed)
Visit Information  Goals Addressed              This Visit's Progress   .  RN-I am having diarhea every morning (pt-stated)        Current Barriers:  [name] Knowledge Deficits related to why she is having diarrhea consistently in the mornings. 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  . Patient expressed concern over weight loss(12lbs in 1 year).  01-25-2020: The patient states she continues to lose weight- does not have a working Psychologist, occupational Case Manager Clinical Goal(s):  [name] Over the next 120 days, patient will work with [facility]  to address needs related to diarrhea   Interventions:  . Discussed plans with patient for ongoing care management follow up and provided patient with direct contact information for care management team . Evaluation of diarrhea episodes:  The patient states that she had a diarrhea stool on Sunday 01-22-2020 but has not had a bowel movement since then. The patient denies constipation.  . Will plan to see patient at upcoming PCP visit 02/07/2020 @ 4:20.  . Recommendations on writing questions down for the pcp to ask at her next appointment. She wants to address the weight loss and bowel habits with the pcp at next appointment on 02-07-2020.  Patient Self Care Activities:  . Currently UNABLE TO independently control diarhea  or changes in bowel patterns  Please see past updates related to this goal by clicking on the "Past Updates" button in the selected goal      .  RNCM: Chronic Disease Management and Care Coordination        CARE PLAN ENTRY (see longtitudinal plan of care for additional care plan information)  Current Barriers:  . Chronic Disease Management support, education, and care coordination needs related to HTN, HLD, DMII, and CKD Stage 3 . Does not have a blood pressure cuff . Does not have a working scale: verbalized weight loss  Clinical Goal(s) related to HTN, HLD, DMII, and CKD Stage 3 :  Over the next 120  days, patient will:  . Work with the care management team to address educational, disease management, and care coordination needs  . Begin or continue self health monitoring activities as directed today Measure and record cbg (blood glucose) 1 times daily, Measure and record blood pressure 2/3 times per week, and adhere to a heart healthy/ADA diet  . Call provider office for new or worsened signs and symptoms Blood glucose findings outside established parameters, Blood pressure findings outside established parameters, Shortness of breath, and New or worsened symptom related to HLD/CKD and other chronic conditions . Call care management team with questions or concerns . Verbalize basic understanding of patient centered plan of care established today  Interventions related to HTN, HLD, DMII, and CKD Stage 3 :  . Evaluation of current treatment plans and patient's adherence to plan as established by provider.  The patient states she does not take her blood sugars daily but when she does check them they are good. Last couple of readings were 108 and 112.  The patient is compliant with recommendations by the provider.  . Assessed patient understanding of disease states.  Has a good understanding of chronic conditions and how to effectively manage.  . Assessed patient's education and care coordination needs.  Evaluation of ability to take blood pressure. The patient does not have a bp cuff. Education on the OTC benefit through [facility] and the ability to get a  blood pressure cuff through the benefit. The patient will check into this.  . Provided disease specific education to patient.  Education on following heart healthy/ADA diet, having sugar snacks on hand in the event of hypoglycemic event. Also reviewed the s/s of hyperglycemia and hypoglycemia with the patient . Collaborated with appropriate clinical care team members regarding patient needs.  The patient knows about resources and has support from the [facility] team  pharmacist . Evaluation of next appointment with pcp: 02/07/2020 at 420 pm.  Education on writing questions down to ask the pcp at her appointment. RNCM will anticipate seeing the patient in the office that day. Will have on hand a calendar and log book for blood sugar recordings and also a place to record blood pressure readings when she starts taking blood pressures in home environment.   Patient Self Care Activities related to HTN, HLD, DMII, and CKD Stage 3 :  . Patient is unable to independently self-manage chronic health conditions  Please see past updates related to this goal by clicking on the "Past Updates" button in the selected goal      .  RNCM: Pt-"I don't know if I have COVID or not" (pt-stated)        CARE PLAN ENTRY (see longitudinal plan of care for additional care plan information)  Current Barriers:  [name] Knowledge Deficits related to productive cough and being sick all weekend . Care Coordination needs related to not knowing what to do for sx/sx in a patient with possibly an acute respiratory infection vs COVID 19 per the patient  (disease states)  Nurse Case Manager Clinical Goal(s):  [name] Over the next 30 days, patient will verbalize understanding of plan for finding etiology and treatment of current acute respiratory issue . Over the next 30 days, patient will demonstrate a decrease in respiratory exacerbations as evidenced by resolved sx/sx of respiratory infection/?COVID/or other illness as expressed by the patient.   Interventions:  . Inter-disciplinary care team collaboration (see longitudinal plan of care) . Evaluation of current treatment plan related to current treatment at home for sx/sx of respiratory issues and patient's adherence to plan as established by provider. . Advised patient to seek care at the urgent care due to issues at the office with Internet connections and her pcp being out of town. 01-25-2020: The patient states she could not even get into the urgent  care.  The patient states that she is feeling much better.  She has a productive cough with yellow sputum.  Advised the patient if she notices change in color of green or brown to call the office for a sooner appointment than 02-07-2020 with the pcp. The patient states that she is not running a fever and she feels much better. She denies any acute distress.  Verbalized understanding.  . Provided education to patient re: being evaluated to determine the source of her sx/sx: The patient is reporting productive cough and being sick over the weekend. Is having a harder time at night when she lays down to go to sleep. 01-25-2020: The patient is still having a productive cough but not having issues with shortness of breath. States her family is concerned that she may have COVID but the patient does not feel like she does. She states that she feels like she was just having some sinus drainage. Will call the office if she has a change in her condition.  . Discussed plans with patient for ongoing care management follow up and provided patient with  direct contact information for care management team  Patient Self Care Activities:  . Patient verbalizes understanding of plan to seek care at an urgent care for acute sx/sx of upper respiratory infection vs ?COVID19 . Calls provider office for new concerns or questions . Unable to independently determine what is causing her sx/sx and change in respiratory status and questioning if she possibly has COVID 19 and should be tested.   Please see past updates related to this goal by clicking on the "Past Updates" button in the selected goal         Patient verbalizes understanding of instructions provided today.   Telephone follow up appointment with care management team member scheduled for: 03-13-2020 at 0930. Anticipate seeing the patient face to face with provider on 02-07-2020 at 420 pm.  Noreene Larsson RN, MSN, [facility] Mobile: [phone]

## 2020-01-25 NOTE — Chronic Care Management (AMB) (Signed)
Chronic Care Management   Follow Up Note   01/25/2020 Name: Tina Mcdonald MRN: [id number] DOB: 17-Dec-1940  Referred by: [name], NP Reason for referral : Chronic Care Management (RNCM Follow up Call: Chronic Disease Managment and Care Coordination Needs)   Tina Mcdonald is a 79 y.o. year old female who is a primary care patient of [name], [name], NP. The CCM team was consulted for assistance with chronic disease management and care coordination needs.    Review of patient status, including review of consultants reports, relevant laboratory and other test results, and collaboration with appropriate care team members and the patient's provider was performed as part of comprehensive patient evaluation and provision of chronic care management services.    SDOH (Social Determinants of Health) assessments performed: Yes See Care Plan activities for detailed interventions related to SDOH)  SDOH Interventions     Most Recent Value  SDOH Interventions  Physical Activity Interventions Other (Comments)  [the patient states since COVID she has not been active- wants to increase activity level]       Outpatient Encounter Medications as of 01/25/2020  Medication Sig Note  . acetaminophen (TYLENOL) 325 MG tablet Take 650 mg by mouth every 6 (six) hours as needed.   [name] atorvastatin (LIPITOR) 80 MG tablet Take 1 tablet (80 mg total) by mouth daily. 01/11/2020: Holding on day that she takes fluconazole  . blood glucose meter kit and supplies KIT Dispense based on patient and insurance preference. Use up to four times daily as directed. (FOR ICD-9 250.00, 250.01).   . ciclopirox (PENLAC) 8 % solution Apply topically at bedtime. Apply over nail and surrounding skin. Apply daily over previous coat. After seven (7) days, may remove with alcohol and continue cycle.   . cloNIDine (CATAPRES) 0.1 MG tablet Take 0.1 mg by mouth 2 (two) times daily.   . diclofenac sodium (VOLTAREN) 1 % GEL Apply 2 g  topically 2 (two) times daily as needed.   . Ferrous Sulfate (SLOW FE PO) Take 1 tablet by mouth daily.   . fluconazole (DIFLUCAN) 200 MG tablet Take 1 tablet (200 mg total) by mouth once a week.   . fluconazole (DIFLUCAN) 200 MG tablet Take 1 tablet once a week   . gabapentin (NEURONTIN) 100 MG capsule Take 3 capsules (300 mg total) by mouth at bedtime. (Patient taking differently: Take 200 mg by mouth at bedtime. )   . glucose blood test strip Check daily. ICD 10 E11.9   . hydrochlorothiazide (HYDRODIURIL) 25 MG tablet Take 1 tablet (25 mg total) by mouth daily.   . metFORMIN (GLUCOPHAGE-XR) 500 MG 24 hr tablet Take 1 tablet (500 mg total) by mouth 2 (two) times daily.   . metoprolol succinate (TOPROL-XL) 50 MG 24 hr tablet Take 1 tablet (50 mg total) by mouth daily. Take with or immediately following a meal.   . metroNIDAZOLE (FLAGYL) 500 MG tablet Take 1 tablet (500 mg total) by mouth 3 (three) times daily. (Patient not taking: Reported on 01/11/2020)   . omeprazole (PRILOSEC) 20 MG capsule Take 1 capsule (20 mg total) by mouth daily.   . vitamin C (ASCORBIC ACID) 250 MG tablet Take 2 tablets (500 mg total) by mouth daily.    Facility-Administered Encounter Medications as of 01/25/2020  Medication  . cyanocobalamin ((VITAMIN B-12)) injection 1,000 mcg     Objective:  BP Readings from Last 3 Encounters:  12/02/19 123/80  09/24/19 (!) 198/82  04/19/19 (!) 155/90  Lab Results  Component Value Date   HGBA1C 6.2 12/02/2019    Goals Addressed              This Visit's Progress   .  RN-I am having diarhea every morning (pt-stated)        Current Barriers:  [name] Knowledge Deficits related to why she is having diarrhea consistently in the mornings. 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  . Patient expressed concern over weight loss(12lbs in 1 year).  01-25-2020: The patient states she continues to lose weight- does not have a working  Psychologist, occupational Case Manager Clinical Goal(s):  [name] Over the next 120 days, patient will work with [facility]  to address needs related to diarrhea   Interventions:  . Discussed plans with patient for ongoing care management follow up and provided patient with direct contact information for care management team . Evaluation of diarrhea episodes:  The patient states that she had a diarrhea stool on Sunday 01-22-2020 but has not had a bowel movement since then. The patient denies constipation.  . Will plan to see patient at upcoming PCP visit 02/07/2020 @ 4:20.  . Recommendations on writing questions down for the pcp to ask at her next appointment. She wants to address the weight loss and bowel habits with the pcp at next appointment on 02-07-2020.  Patient Self Care Activities:  . Currently UNABLE TO independently control diarhea  or changes in bowel patterns  Please see past updates related to this goal by clicking on the "Past Updates" button in the selected goal      .  RNCM: Chronic Disease Management and Care Coordination        CARE PLAN ENTRY (see longtitudinal plan of care for additional care plan information)  Current Barriers:  . Chronic Disease Management support, education, and care coordination needs related to HTN, HLD, DMII, and CKD Stage 3 . Does not have a blood pressure cuff . Does not have a working scale: verbalized weight loss  Clinical Goal(s) related to HTN, HLD, DMII, and CKD Stage 3 :  Over the next 120 days, patient will:  . Work with the care management team to address educational, disease management, and care coordination needs  . Begin or continue self health monitoring activities as directed today Measure and record cbg (blood glucose) 1 times daily, Measure and record blood pressure 2/3 times per week, and adhere to a heart healthy/ADA diet  . Call provider office for new or worsened signs and symptoms Blood glucose findings outside established parameters, Blood pressure  findings outside established parameters, Shortness of breath, and New or worsened symptom related to HLD/CKD and other chronic conditions . Call care management team with questions or concerns . Verbalize basic understanding of patient centered plan of care established today  Interventions related to HTN, HLD, DMII, and CKD Stage 3 :  . Evaluation of current treatment plans and patient's adherence to plan as established by provider.  The patient states she does not take her blood sugars daily but when she does check them they are good. Last couple of readings were 108 and 112.  The patient is compliant with recommendations by the provider.  . Assessed patient understanding of disease states.  Has a good understanding of chronic conditions and how to effectively manage.  . Assessed patient's education and care coordination needs.  Evaluation of ability to take blood pressure. The patient does not have a bp cuff. Education  on the OTC benefit through [facility] and the ability to get a blood pressure cuff through the benefit. The patient will check into this.  . Provided disease specific education to patient.  Education on following heart healthy/ADA diet, having sugar snacks on hand in the event of hypoglycemic event. Also reviewed the s/s of hyperglycemia and hypoglycemia with the patient . Collaborated with appropriate clinical care team members regarding patient needs.  The patient knows about resources and has support from the [facility] team pharmacist . Evaluation of next appointment with pcp: 02/07/2020 at 420 pm.  Education on writing questions down to ask the pcp at her appointment. RNCM will anticipate seeing the patient in the office that day. Will have on hand a calendar and log book for blood sugar recordings and also a place to record blood pressure readings when she starts taking blood pressures in home environment.   Patient Self Care Activities related to HTN, HLD, DMII, and CKD Stage 3 :  . Patient is  unable to independently self-manage chronic health conditions  Please see past updates related to this goal by clicking on the "Past Updates" button in the selected goal      .  RNCM: Pt-"I don't know if I have COVID or not" (pt-stated)        CARE PLAN ENTRY (see longitudinal plan of care for additional care plan information)  Current Barriers:  [name] Knowledge Deficits related to productive cough and being sick all weekend . Care Coordination needs related to not knowing what to do for sx/sx in a patient with possibly an acute respiratory infection vs COVID 19 per the patient  (disease states)  Nurse Case Manager Clinical Goal(s):  [name] Over the next 30 days, patient will verbalize understanding of plan for finding etiology and treatment of current acute respiratory issue . Over the next 30 days, patient will demonstrate a decrease in respiratory exacerbations as evidenced by resolved sx/sx of respiratory infection/?COVID/or other illness as expressed by the patient.   Interventions:  . Inter-disciplinary care team collaboration (see longitudinal plan of care) . Evaluation of current treatment plan related to current treatment at home for sx/sx of respiratory issues and patient's adherence to plan as established by provider. . Advised patient to seek care at the urgent care due to issues at the office with Internet connections and her pcp being out of town. 01-25-2020: The patient states she could not even get into the urgent care.  The patient states that she is feeling much better.  She has a productive cough with yellow sputum.  Advised the patient if she notices change in color of green or brown to call the office for a sooner appointment than 02-07-2020 with the pcp. The patient states that she is not running a fever and she feels much better. She denies any acute distress.  Verbalized understanding.  . Provided education to patient re: being evaluated to determine the source of her sx/sx: The  patient is reporting productive cough and being sick over the weekend. Is having a harder time at night when she lays down to go to sleep. 01-25-2020: The patient is still having a productive cough but not having issues with shortness of breath. States her family is concerned that she may have COVID but the patient does not feel like she does. She states that she feels like she was just having some sinus drainage. Will call the office if she has a change in her condition.  . Discussed plans  with patient for ongoing care management follow up and provided patient with direct contact information for care management team  Patient Self Care Activities:  . Patient verbalizes understanding of plan to seek care at an urgent care for acute sx/sx of upper respiratory infection vs ?COVID19 . Calls provider office for new concerns or questions . Unable to independently determine what is causing her sx/sx and change in respiratory status and questioning if she possibly has COVID 19 and should be tested.   Please see past updates related to this goal by clicking on the "Past Updates" button in the selected goal          Plan:   Telephone follow up appointment with care management team member scheduled for: 03-13-2020 at 0930. Anticipate a face to face brief visit with the patient at appointment on 02-07-2020 at 0420 pm   [place], MSN, [facility] Mobile: [phone]

## 2020-02-02 ENCOUNTER — Encounter: Payer: Self-pay | Admitting: Nurse Practitioner

## 2020-02-02 DIAGNOSIS — I700 Atherosclerosis of aorta: Secondary | ICD-10-CM | POA: Insufficient documentation

## 2020-02-07 ENCOUNTER — Ambulatory Visit (INDEPENDENT_AMBULATORY_CARE_PROVIDER_SITE_OTHER): Payer: Medicare Other | Admitting: Nurse Practitioner

## 2020-02-07 ENCOUNTER — Other Ambulatory Visit: Payer: Self-pay

## 2020-02-07 ENCOUNTER — Encounter: Payer: Self-pay | Admitting: Nurse Practitioner

## 2020-02-07 ENCOUNTER — Ambulatory Visit: Payer: Medicare Other | Admitting: Dermatology

## 2020-02-07 VITALS — BP 119/67 | HR 82 | Temp 98.5°F | Wt 160.0 lb

## 2020-02-07 DIAGNOSIS — R197 Diarrhea, unspecified: Secondary | ICD-10-CM

## 2020-02-07 DIAGNOSIS — B351 Tinea unguium: Secondary | ICD-10-CM

## 2020-02-07 DIAGNOSIS — M79674 Pain in right toe(s): Secondary | ICD-10-CM | POA: Diagnosis not present

## 2020-02-07 DIAGNOSIS — E538 Deficiency of other specified B group vitamins: Secondary | ICD-10-CM | POA: Diagnosis not present

## 2020-02-07 MED ORDER — CYANOCOBALAMIN 1000 MCG/ML IJ SOLN
1000.0000 ug | Freq: Once | INTRAMUSCULAR | Status: AC
  Administered 2020-02-07: 1000 ug via INTRAMUSCULAR

## 2020-02-07 MED ORDER — FLUCONAZOLE 200 MG PO TABS: 200.0000 mg | ORAL_TABLET | ORAL | 0 refills | Status: DC

## 2020-02-07 MED ORDER — TAVABOROLE 5 % EX SOLN: CUTANEOUS | 1 refills | Status: DC

## 2020-02-07 NOTE — Progress Notes (Signed)
   Follow-Up Visit   Subjective  Tina Mcdonald is a 79 y.o. female who presents for the following: onychomychosis (3 month follow-up. Fingernails all improving except thumbnails. She is taking fluconazole 200mg  once a week (5 months total) and using ciclopirox solution.). No side effects from fluconazole. 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  R thumbnail still is dark and thickened, but has improved a little.   The following portions of the chart were reviewed this encounter and updated as appropriate:      Review of Systems:  No other skin or systemic complaints except as noted in HPI or Assessment and Plan.  Objective  Well appearing patient in no apparent distress; mood and affect are within normal limits.  A focused examination was performed including face, neck, chest and back. Relevant physical exam findings are noted in the Assessment and Plan.  Objective  Fingernails: R thumbnail with ~70% hyperpigmentation with thickening and subungual debris. Other nails much improved. L thumbnail with linear hyperpigmented vertical streak on central nail.  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 Start Kerydin solution Apply to thumbnails/fingernails QHS. If not covered by insurance, will continue ciclopirox lacquer qd.  Tavaborole (KERYDIN) 5 % SOLN - Fingernails  Reordered Medications fluconazole (DIFLUCAN) 200 MG tablet  Return in about 3 months (around 05/09/2020) for tinea unguium.   IJamesetta Orleans, CMA, am acting as scribe for Brendolyn Patty, MD .  Documentation: I have reviewed the above documentation for accuracy and completeness, and I agree with the above.  Brendolyn Patty MD

## 2020-02-07 NOTE — Progress Notes (Signed)
BP 119/67   Pulse 82   Temp 98.5 F (36.9 C) (Oral)   Wt 160 lb (72.6 kg)   LMP  (LMP Unknown)   SpO2 98%   BMI 29.26 kg/m    Subjective:    Patient ID: Tina Mcdonald, female    DOB: 11-29-1940, 79 y.o.   MRN: [id number]  HPI: Tina Mcdonald is a 79 y.o. female  Chief Complaint  Patient presents with  . Diarrhea    pt states the diarrhea cleared up, has only had runny stools like 2 or 3 times since them    DIARRHEA Follow-up for visit on 01/09/20.  Had acute episode of diarrhea recently which is improving, no nausea or vomiting.  When initially started had some emesis episodes, but these have improved.  Was prescribed Cipro and Flagyl, which improved symptoms she reports.   Status: better Treatments attempted: none Fever: none+ Nausea:  none Vomiting: none Weight loss: none Decreased appetite: no Diarrhea: yes Constipation: no Blood in stool: no Heartburn: no Jaundice: no Rash: no Dysuria/urinary frequency: no Hematuria: no History of sexually transmitted disease: no Recurrent NSAID use: no  REFERRAL: Would like referral to podiatry.  Reports seeing them in past and currently having issues with toenail on her right great toe she would like looked at.  She is concerned it may be becoming ingrown and reports having to have this worked on in past.  Relevant past medical, surgical, family and social history reviewed and updated as indicated. Interim medical history since our last visit reviewed. Allergies and medications reviewed and updated.  Review of Systems  Constitutional: Negative for activity change, appetite change, diaphoresis, fatigue and fever.  Respiratory: Negative for cough, chest tightness and shortness of breath.   Cardiovascular: Negative for chest pain, palpitations and leg swelling.  Gastrointestinal: Negative for abdominal distention, abdominal pain, anal bleeding, blood in stool, constipation, diarrhea, nausea, rectal pain and vomiting.    Endocrine: Negative.   Neurological: Negative.   Psychiatric/Behavioral: Negative.     Per HPI unless specifically indicated above     Objective:    BP 119/67   Pulse 82   Temp 98.5 F (36.9 C) (Oral)   Wt 160 lb (72.6 kg)   LMP  (LMP Unknown)   SpO2 98%   BMI 29.26 kg/m   Wt Readings from Last 3 Encounters:  02/07/20 160 lb (72.6 kg)  12/02/19 165 lb (74.8 kg)  09/24/19 163 lb (73.9 kg)    Physical Exam Vitals and nursing note reviewed.  Constitutional:      General: She is awake. She is not in acute distress.    Appearance: She is well-developed, well-groomed and overweight. She is not ill-appearing.  HENT:     Head: Normocephalic.     Right Ear: Hearing normal.     Left Ear: Hearing normal.  Eyes:     General: Lids are normal.        Right eye: No discharge.        Left eye: No discharge.     Conjunctiva/sclera: Conjunctivae normal.     Pupils: Pupils are equal, round, and reactive to light.  Neck:     Thyroid: No thyromegaly.     Vascular: No carotid bruit.  Cardiovascular:     Rate and Rhythm: Normal rate and regular rhythm.     Heart sounds: Normal heart sounds. No murmur heard.  No gallop.   Pulmonary:     Effort: Pulmonary effort is normal. No  accessory muscle usage or respiratory distress.     Breath sounds: Normal breath sounds.  Abdominal:     General: Bowel sounds are normal. There is no distension.     Palpations: Abdomen is soft. There is no hepatomegaly or splenomegaly.     Tenderness: There is no abdominal tenderness.  Musculoskeletal:     Cervical back: Normal range of motion and neck supple.     Right lower leg: No edema.     Left lower leg: No edema.  Feet:     Right foot:     Toenail Condition: Right toenails are abnormally thick.     Left foot:     Toenail Condition: Left toenails are abnormally thick.     Comments: Nail beginning to embed into medial aspect of right great toe.  Nail polish on all toenail, with thick toenails  present.  Right great toe with no erythema, warmth, or edema.  Mildly tender to touch. Lymphadenopathy:     Cervical: No cervical adenopathy.  Skin:    General: Skin is warm and dry.  Neurological:     Mental Status: She is alert and oriented to person, place, and time.  Psychiatric:        Attention and Perception: Attention normal.        Mood and Affect: Mood normal.        Speech: Speech normal.        Behavior: Behavior normal. Behavior is cooperative.        Thought Content: Thought content normal.     Results for orders placed or performed in visit on 01/12/20  Comprehensive metabolic panel  Result Value Ref Range   Glucose 93 65 - 99 mg/dL   BUN 19 8 - 27 mg/dL   Creatinine, Ser 0.93 0.57 - 1.00 mg/dL   GFR calc non Af Amer 59 (L) >59 mL/min/1.73   GFR calc Af Amer 69 >59 mL/min/1.73   BUN/Creatinine Ratio 20 12 - 28   Sodium 144 134 - 144 mmol/L   Potassium 4.3 3.5 - 5.2 mmol/L   Chloride 107 (H) 96 - 106 mmol/L   CO2 24 20 - 29 mmol/L   Calcium 9.7 8.7 - 10.3 mg/dL   Total Protein 6.4 6.0 - 8.5 g/dL   Albumin 4.1 3.7 - 4.7 g/dL   Globulin, Total 2.3 1.5 - 4.5 g/dL   Albumin/Globulin Ratio 1.8 1.2 - 2.2   Bilirubin Total 0.2 0.0 - 1.2 mg/dL   Alkaline Phosphatase 68 48 - 121 IU/L   AST 16 0 - 40 IU/L   ALT 14 0 - 32 IU/L  CBC with Differential/Platelet  Result Value Ref Range   WBC 5.5 3.4 - 10.8 x10E3/uL   RBC 4.46 3.77 - 5.28 x10E6/uL   Hemoglobin 10.8 (L) 11.1 - 15.9 g/dL   Hematocrit 34.6 34.0 - 46.6 %   MCV 78 (L) 79 - 97 fL   MCH 24.2 (L) 26.6 - 33.0 pg   MCHC 31.2 (L) 31 - 35 g/dL   RDW 15.4 11.7 - 15.4 %   Platelets 220 150 - 450 x10E3/uL   Neutrophils 52 Not Estab. %   Lymphs 37 Not Estab. %   Monocytes 8 Not Estab. %   Eos 3 Not Estab. %   Basos 0 Not Estab. %   Neutrophils Absolute 2.8 1 - 7 x10E3/uL   Lymphocytes Absolute 2.0 0 - 3 x10E3/uL   Monocytes Absolute 0.4 0 - 0 x10E3/uL   EOS (ABSOLUTE)  0.2 0.0 - 0.4 x10E3/uL   Basophils  Absolute 0.0 0 - 0 x10E3/uL   Immature Granulocytes 0 Not Estab. %   Immature Grans (Abs) 0.0 0.0 - 0.1 x10E3/uL      Assessment & Plan:   Problem List Items Addressed This Visit      Other   B12 deficiency   Diarrhea - Primary    Acute and improved at this time after treatment.  Recommend monitoring diet closely to prevent return of symptoms.  Return for worsening symptoms and as scheduled.       Other Visit Diagnoses    Great toe pain, right       Referral to podiatry.   Relevant Orders   Ambulatory referral to Podiatry       Follow up plan: Return in about 6 weeks (around 03/20/2020) for T2DM, HTN/HLD -- please see if has visit.

## 2020-02-07 NOTE — Patient Instructions (Signed)

## 2020-02-07 NOTE — Assessment & Plan Note (Signed)
Acute and improved at this time after treatment.  Recommend monitoring diet closely to prevent return of symptoms.  Return for worsening symptoms and as scheduled.

## 2020-02-13 ENCOUNTER — Other Ambulatory Visit: Payer: Self-pay

## 2020-02-13 MED ORDER — CICLOPIROX 8 % EX SOLN: Freq: Every day | CUTANEOUS | 1 refills | Status: DC

## 2020-02-17 ENCOUNTER — Ambulatory Visit: Payer: Medicare Other | Admitting: Podiatry

## 2020-02-23 ENCOUNTER — Encounter (INDEPENDENT_AMBULATORY_CARE_PROVIDER_SITE_OTHER): Payer: Self-pay

## 2020-02-23 ENCOUNTER — Ambulatory Visit: Payer: Medicare Other | Admitting: Podiatry

## 2020-02-23 ENCOUNTER — Other Ambulatory Visit: Payer: Self-pay

## 2020-02-23 ENCOUNTER — Encounter: Payer: Self-pay | Admitting: Podiatry

## 2020-02-23 DIAGNOSIS — B351 Tinea unguium: Secondary | ICD-10-CM

## 2020-02-23 DIAGNOSIS — M79674 Pain in right toe(s): Secondary | ICD-10-CM | POA: Diagnosis not present

## 2020-02-23 DIAGNOSIS — E1149 Type 2 diabetes mellitus with other diabetic neurological complication: Secondary | ICD-10-CM

## 2020-02-23 DIAGNOSIS — M79675 Pain in left toe(s): Secondary | ICD-10-CM | POA: Diagnosis not present

## 2020-02-23 MED ORDER — CICLOPIROX 8 % EX SOLN: Freq: Every day | CUTANEOUS | 0 refills | Status: DC

## 2020-02-28 NOTE — Progress Notes (Signed)
  Subjective:  Patient ID: Tina Mcdonald, female    DOB: 1941/03/21,  MRN: [id number]  Chief Complaint  Patient presents with  . Nail Problem    Patient presents tody for ingrown toenails bilat hallux and nail fungus   79 y.o. female returns for the above complaint.  Patient presents with thickened elongated dystrophic toenails x10.  Patient would like to know if there is any kind treatment options for it.  She would like to have them debrided down as she is not able to do it herself.  She denies any other acute complaints.  She would like to discuss topical options.  Objective:  There were no vitals filed for this visit. Podiatric Exam: Vascular: dorsalis pedis and posterior tibial pulses are palpable bilateral. Capillary return is immediate. Temperature gradient is WNL. Skin turgor WNL  Sensorium: Normal Semmes Weinstein monofilament test. Normal tactile sensation bilaterally. Nail Exam: Pt has thick disfigured discolored nails with subungual debris noted bilateral entire nail hallux through fifth toenails.  Pain on palpation to the nails. Ulcer Exam: There is no evidence of ulcer or pre-ulcerative changes or infection. Orthopedic Exam: Muscle tone and strength are WNL. No limitations in general ROM. No crepitus or effusions noted. HAV  B/L.  Hammer toes 2-5  B/L. Skin: No Porokeratosis. No infection or ulcers    Assessment & Plan:   1. Pain due to onychomycosis of toenails of both feet   2. Nail fungus     Patient was evaluated and treated and all questions answered.  Onychomycosis with pain  -Nails palliatively debrided as below. -Educated on self-care -Penlac was dispensed and the use was directed s for twice a day for next 6 to 8 months.   Procedure: Nail Debridement Rationale: pain  Type of Debridement: manual, sharp debridement. Instrumentation: Nail nipper, rotary burr. Number of Nails: 10  Procedures and Treatment: Consent by patient was obtained for treatment  procedures. The patient understood the discussion of treatment and procedures well. All questions were answered thoroughly reviewed. Debridement of mycotic and hypertrophic toenails, 1 through 5 bilateral and clearing of subungual debris. No ulceration, no infection noted.  Return Visit-Office Procedure: Patient instructed to return to the office for a follow up visit 3 months for continued evaluation and treatment.  Boneta Lucks, DPM    No follow-ups on file.

## 2020-03-13 ENCOUNTER — Telehealth: Payer: Self-pay | Admitting: General Practice

## 2020-03-13 ENCOUNTER — Ambulatory Visit (INDEPENDENT_AMBULATORY_CARE_PROVIDER_SITE_OTHER): Payer: Medicare Other | Admitting: General Practice

## 2020-03-13 DIAGNOSIS — E785 Hyperlipidemia, unspecified: Secondary | ICD-10-CM

## 2020-03-13 DIAGNOSIS — I10 Essential (primary) hypertension: Secondary | ICD-10-CM | POA: Diagnosis not present

## 2020-03-13 DIAGNOSIS — E1122 Type 2 diabetes mellitus with diabetic chronic kidney disease: Secondary | ICD-10-CM

## 2020-03-13 DIAGNOSIS — E1165 Type 2 diabetes mellitus with hyperglycemia: Secondary | ICD-10-CM | POA: Diagnosis not present

## 2020-03-13 DIAGNOSIS — E1169 Type 2 diabetes mellitus with other specified complication: Secondary | ICD-10-CM

## 2020-03-13 DIAGNOSIS — M79674 Pain in right toe(s): Secondary | ICD-10-CM

## 2020-03-13 DIAGNOSIS — E1159 Type 2 diabetes mellitus with other circulatory complications: Secondary | ICD-10-CM

## 2020-03-13 DIAGNOSIS — IMO0002 Reserved for concepts with insufficient information to code with codable children: Secondary | ICD-10-CM

## 2020-03-13 DIAGNOSIS — N183 Chronic kidney disease, stage 3 unspecified: Secondary | ICD-10-CM | POA: Diagnosis not present

## 2020-03-13 DIAGNOSIS — R197 Diarrhea, unspecified: Secondary | ICD-10-CM

## 2020-03-13 DIAGNOSIS — I152 Hypertension secondary to endocrine disorders: Secondary | ICD-10-CM

## 2020-03-13 NOTE — Patient Instructions (Signed)
Visit Information  Goals Addressed              This Visit's Progress     COMPLETED: RN-I am having diarhea every morning (pt-stated)        Current Barriers: 03-13-2020: The goal is being completed. The patient is doing well and has no new concerns related to diarrhea. The patient will let notify the pcp or RNCM for any changes.   Knowledge Deficits related to why she is having diarrhea consistently in the mornings. 01-25-2020: The patient is having sporadic diarrhea episodes  Financial Constraints.   Patient expressed concern over weight loss(12lbs in 1 year).  01-25-2020: The patient states she continues to lose weight- does not have a working Psychologist, occupational Case Manager Clinical Goal(s):   Over the next 120 days, patient will work with [facility]  to address needs related to diarrhea   Interventions:   Discussed plans with patient for ongoing care management follow up and provided patient with direct contact information for care management team  Evaluation of diarrhea episodes:  The patient states that she had a diarrhea stool on Sunday 01-22-2020 but has not had a bowel movement since then. The patient denies constipation.   Will plan to see patient at upcoming PCP visit 02/07/2020 @ 4:20.   Recommendations on writing questions down for the pcp to ask at her next appointment. She wants to address the weight loss and bowel habits with the pcp at next appointment on 02-07-2020. 03-13-2020: The patient denies any more episodes of diarrhea. The patient is doing well. Denies any new concerns with bowel habits. Knows to call for changes.   Patient Self Care Activities:   Currently UNABLE TO independently control diarhea  or changes in bowel patterns  Please see past updates related to this goal by clicking on the "Past Updates" button in the selected goal        RNCM: Chronic Disease Management and Care Coordination        CARE PLAN ENTRY (see longtitudinal plan of care for additional care  plan information)  Current Barriers:   Chronic Disease Management support, education, and care coordination needs related to HTN, HLD, DMII, and CKD Stage 3  Does not have a blood pressure cuff  Does not have a working scale: verbalized weight loss  Clinical Goal(s) related to HTN, HLD, DMII, and CKD Stage 3 :  Over the next 120 days, patient will:   Work with the care management team to address educational, disease management, and care coordination needs   Begin or continue self health monitoring activities as directed today Measure and record cbg (blood glucose) 1 times daily, Measure and record blood pressure 2/3 times per week, and adhere to a heart healthy/ADA diet   Call provider office for new or worsened signs and symptoms Blood glucose findings outside established parameters, Blood pressure findings outside established parameters, Shortness of breath, and New or worsened symptom related to HLD/CKD and other chronic conditions  Call care management team with questions or concerns  Verbalize basic understanding of patient centered plan of care established today  Interventions related to HTN, HLD, DMII, and CKD Stage 3 :   Evaluation of current treatment plans and patient's adherence to plan as established by provider.  The patient states she does not take her blood sugars daily but when she does check them they are good. Last couple of readings were 108 and 112.  The patient is compliant with recommendations by the provider.  03-13-2020: The patient is doing well. Is maintaining healthy diabetic health. She states that her eating has been a little off last week as her friends husband passed away and she has had different things to eat than she normally does. She is back on track this week.  Empathetic listening and support given. Denies any new concerns related to DM/HTN/HLD/CKD3  Assessed patient understanding of disease states.  Has a good understanding of chronic conditions and how  to effectively manage. 03-13-2020: The patient ask good questions. The patient denies any new concerns at this time with chronic conditions. Is having an issue with nail fungus. Has fungus on her big thumb. She also went to a podiatrist recently and she was not pleased with her care there. She states the podiatrist did not have bedside manner and he did a poor job of cutting her nails. She says she will not go back to him.  Education on writing questions down to ask pcp at next visit on 03-20-2020.  The patient has follow up pcp visit for that day.   Assessed patient's education and care coordination needs.  Evaluation of ability to take blood pressure. The patient does not have a bp cuff. Education on the OTC benefit through [facility] and the ability to get a blood pressure cuff through the benefit. The patient will check into this.   Provided disease specific education to patient.  Education on following heart healthy/ADA diet, having sugar snacks on hand in the event of hypoglycemic event. Also reviewed the s/s of hyperglycemia and hypoglycemia with the patient  Collaborated with appropriate clinical care team members regarding patient needs.  The patient knows about resources and has support from the [facility] team pharmacist  Evaluation of next appointment with pcp: 03-20-2020 at 120 pm.  Education on writing questions down to ask the pcp at her appointment. Has AWV call for 03-19-2020.    Patient Self Care Activities related to HTN, HLD, DMII, and CKD Stage 3 :   Patient is unable to independently self-manage chronic health conditions  Please see past updates related to this goal by clicking on the "Past Updates" button in the selected goal        COMPLETED: RNCM: Pt-"I don't know if I have COVID or not" (pt-stated)        CARE PLAN ENTRY (see longitudinal plan of care for additional care plan information)  Current Barriers: 03-13-2020: Goal completed. The patient is not experiencing any Sx/SX of COVID or  URI. Denies any respiratory related issues at this time.   Knowledge Deficits related to productive cough and being sick all weekend  Care Coordination needs related to not knowing what to do for sx/sx in a patient with possibly an acute respiratory infection vs COVID 19 per the patient  (disease states)  Nurse Case Manager Clinical Goal(s):   Over the next 30 days, patient will verbalize understanding of plan for finding etiology and treatment of current acute respiratory issue  Over the next 30 days, patient will demonstrate a decrease in respiratory exacerbations as evidenced by resolved sx/sx of respiratory infection/?COVID/or other illness as expressed by the patient.   Interventions:   Inter-disciplinary care team collaboration (see longitudinal plan of care)  Evaluation of current treatment plan related to current treatment at home for sx/sx of respiratory issues and patient's adherence to plan as established by provider.  Advised patient to seek care at the urgent care due to issues at the office with Internet connections and her pcp  being out of town. 01-25-2020: The patient states she could not even get into the urgent care.  The patient states that she is feeling much better.  She has a productive cough with yellow sputum.  Advised the patient if she notices change in color of green or brown to call the office for a sooner appointment than 02-07-2020 with the pcp. The patient states that she is not running a fever and she feels much better. She denies any acute distress.  Verbalized understanding. 03-13-2020: The patient has not further issues with URI or COVID symptoms. The patient states she is feeling great. Goal being completed.   Provided education to patient re: being evaluated to determine the source of her sx/sx: The patient is reporting productive cough and being sick over the weekend. Is having a harder time at night when she lays down to go to sleep. 01-25-2020: The patient is  still having a productive cough but not having issues with shortness of breath. States her family is concerned that she may have COVID but the patient does not feel like she does. She states that she feels like she was just having some sinus drainage. Will call the office if she has a change in her condition.   Discussed plans with patient for ongoing care management follow up and provided patient with direct contact information for care management team  Patient Self Care Activities:   Patient verbalizes understanding of plan to seek care at an urgent care for acute sx/sx of upper respiratory infection vs ?[place] provider office for new concerns or questions  Unable to independently determine what is causing her sx/sx and change in respiratory status and questioning if she possibly has COVID 19 and should be tested.   Please see past updates related to this goal by clicking on the "Past Updates" button in the selected goal         Patient verbalizes understanding of instructions provided today.   Telephone follow up appointment with care management team member scheduled for: 05-22-2020 at [place] am  Noreene Larsson RN, MSN, [facility] Mobile: [phone]

## 2020-03-13 NOTE — Chronic Care Management (AMB) (Signed)
Chronic Care Management   Follow Up Note   03/13/2020 Name: Tina Mcdonald MRN: [id number] DOB: October 14, 1940  Referred by: [name], NP Reason for referral : Chronic Care Management (RNCM Follow up for Chronic Disease Management and Care Coordination Needs)   Tina Mcdonald is a 79 y.o. year old female who is a primary care patient of [name], [name], NP. The CCM team was consulted for assistance with chronic disease management and care coordination needs.    Review of patient status, including review of consultants reports, relevant laboratory and other test results, and collaboration with appropriate care team members and the patient's provider was performed as part of comprehensive patient evaluation and provision of chronic care management services.    SDOH (Social Determinants of Health) assessments performed: Yes See Care Plan activities for detailed interventions related to [facility])     Outpatient Encounter Medications as of 03/13/2020  Medication Sig Note  . acetaminophen (TYLENOL) 325 MG tablet Take 650 mg by mouth every 6 (six) hours as needed.   [name] atorvastatin (LIPITOR) 80 MG tablet Take 1 tablet (80 mg total) by mouth daily. 01/11/2020: Holding on day that she takes fluconazole  . blood glucose meter kit and supplies KIT Dispense based on patient and insurance preference. Use up to four times daily as directed. (FOR ICD-9 250.00, 250.01).   . ciclopirox (PENLAC) 8 % solution Apply topically at bedtime. Apply over nail and surrounding skin. Apply daily over previous coat. After seven (7) days, may remove with alcohol and continue cycle.   . ciclopirox (PENLAC) 8 % solution Apply topically at bedtime. Apply over nail and surrounding skin. Apply daily over previous coat. After seven (7) days, may remove with alcohol and continue cycle.   . cloNIDine (CATAPRES) 0.1 MG tablet Take 0.1 mg by mouth 2 (two) times daily.   . diclofenac sodium (VOLTAREN) 1 % GEL Apply 2 g  topically 2 (two) times daily as needed.   . Ferrous Sulfate (SLOW FE PO) Take 1 tablet by mouth daily.   . fluconazole (DIFLUCAN) 200 MG tablet Take 1 tablet (200 mg total) by mouth once a week.   . gabapentin (NEURONTIN) 100 MG capsule Take 3 capsules (300 mg total) by mouth at bedtime. (Patient taking differently: Take 200 mg by mouth at bedtime. )   . glucose blood test strip Check daily. ICD 10 E11.9   . hydrochlorothiazide (HYDRODIURIL) 25 MG tablet Take 1 tablet (25 mg total) by mouth daily.   . metFORMIN (GLUCOPHAGE-XR) 500 MG 24 hr tablet Take 1 tablet (500 mg total) by mouth 2 (two) times daily.   . metoprolol succinate (TOPROL-XL) 50 MG 24 hr tablet Take 1 tablet (50 mg total) by mouth daily. Take with or immediately following a meal.   . omeprazole (PRILOSEC) 20 MG capsule Take 1 capsule (20 mg total) by mouth daily.   Corrie Dandy (KERYDIN) 5 % SOLN Apply to fingernails every night.   . vitamin C (ASCORBIC ACID) 250 MG tablet Take 2 tablets (500 mg total) by mouth daily.    Facility-Administered Encounter Medications as of 03/13/2020  Medication  . cyanocobalamin ((VITAMIN B-12)) injection 1,000 mcg     Objective:  BP Readings from Last 3 Encounters:  02/07/20 119/67  12/02/19 123/80  09/24/19 (!) 198/82    Goals Addressed              This Visit's Progress   .  COMPLETED: RN-I am having diarhea every morning (pt-stated)  Current Barriers: 03-13-2020: The goal is being completed. The patient is doing well and has no new concerns related to diarrhea. The patient will let notify the pcp or RNCM for any changes.  . Knowledge Deficits related to why she is having diarrhea consistently in the mornings. 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  . Patient expressed concern over weight loss(12lbs in 1 year).  01-25-2020: The patient states she continues to lose weight- does not have a working Psychologist, occupational Case Manager Clinical  Goal(s):  [name] Over the next 120 days, patient will work with [facility]  to address needs related to diarrhea   Interventions:  . Discussed plans with patient for ongoing care management follow up and provided patient with direct contact information for care management team . Evaluation of diarrhea episodes:  The patient states that she had a diarrhea stool on Sunday 01-22-2020 but has not had a bowel movement since then. The patient denies constipation.  . Will plan to see patient at upcoming PCP visit 02/07/2020 @ 4:20.  . Recommendations on writing questions down for the pcp to ask at her next appointment. She wants to address the weight loss and bowel habits with the pcp at next appointment on 02-07-2020. 03-13-2020: The patient denies any more episodes of diarrhea. The patient is doing well. Denies any new concerns with bowel habits. Knows to call for changes.   Patient Self Care Activities:  . Currently UNABLE TO independently control diarhea  or changes in bowel patterns  Please see past updates related to this goal by clicking on the "Past Updates" button in the selected goal      .  RNCM: Chronic Disease Management and Care Coordination        CARE PLAN ENTRY (see longtitudinal plan of care for additional care plan information)  Current Barriers:  . Chronic Disease Management support, education, and care coordination needs related to HTN, HLD, DMII, and CKD Stage 3 . Does not have a blood pressure cuff . Does not have a working scale: verbalized weight loss  Clinical Goal(s) related to HTN, HLD, DMII, and CKD Stage 3 :  Over the next 120 days, patient will:  . Work with the care management team to address educational, disease management, and care coordination needs  . Begin or continue self health monitoring activities as directed today Measure and record cbg (blood glucose) 1 times daily, Measure and record blood pressure 2/3 times per week, and adhere to a heart healthy/ADA diet  . Call  provider office for new or worsened signs and symptoms Blood glucose findings outside established parameters, Blood pressure findings outside established parameters, Shortness of breath, and New or worsened symptom related to HLD/CKD and other chronic conditions . Call care management team with questions or concerns . Verbalize basic understanding of patient centered plan of care established today  Interventions related to HTN, HLD, DMII, and CKD Stage 3 :  . Evaluation of current treatment plans and patient's adherence to plan as established by provider.  The patient states she does not take her blood sugars daily but when she does check them they are good. Last couple of readings were 108 and 112.  The patient is compliant with recommendations by the provider. 03-13-2020: The patient is doing well. Is maintaining healthy diabetic health. She states that her eating has been a little off last week as her friends husband passed away and she has had different things to eat than she  normally does. She is back on track this week.  Empathetic listening and support given. Denies any new concerns related to DM/HTN/HLD/CKD3 . Assessed patient understanding of disease states.  Has a good understanding of chronic conditions and how to effectively manage. 03-13-2020: The patient ask good questions. The patient denies any new concerns at this time with chronic conditions. Is having an issue with nail fungus. Has fungus on her big thumb. She also went to a podiatrist recently and she was not pleased with her care there. She states the podiatrist did not have bedside manner and he did a poor job of cutting her nails. She says she will not go back to him.  Education on writing questions down to ask pcp at next visit on 03-20-2020.  The patient has follow up pcp visit for that day.  . Assessed patient's education and care coordination needs.  Evaluation of ability to take blood pressure. The patient does not have a bp cuff.  Education on the OTC benefit through [facility] and the ability to get a blood pressure cuff through the benefit. The patient will check into this.  . Provided disease specific education to patient.  Education on following heart healthy/ADA diet, having sugar snacks on hand in the event of hypoglycemic event. Also reviewed the s/s of hyperglycemia and hypoglycemia with the patient . Collaborated with appropriate clinical care team members regarding patient needs.  The patient knows about resources and has support from the [facility] team pharmacist . Evaluation of next appointment with pcp: 03-20-2020 at 120 pm.  Education on writing questions down to ask the pcp at her appointment. Has AWV call for 03-19-2020.    Patient Self Care Activities related to HTN, HLD, DMII, and CKD Stage 3 :  . Patient is unable to independently self-manage chronic health conditions  Please see past updates related to this goal by clicking on the "Past Updates" button in the selected goal      .  COMPLETED: RNCM: Pt-"I don't know if I have COVID or not" (pt-stated)        CARE PLAN ENTRY (see longitudinal plan of care for additional care plan information)  Current Barriers: 03-13-2020: Goal completed. The patient is not experiencing any Sx/SX of COVID or URI. Denies any respiratory related issues at this time.  . Knowledge Deficits related to productive cough and being sick all weekend . Care Coordination needs related to not knowing what to do for sx/sx in a patient with possibly an acute respiratory infection vs COVID 19 per the patient  (disease states)  Nurse Case Manager Clinical Goal(s):  [name] Over the next 30 days, patient will verbalize understanding of plan for finding etiology and treatment of current acute respiratory issue . Over the next 30 days, patient will demonstrate a decrease in respiratory exacerbations as evidenced by resolved sx/sx of respiratory infection/?COVID/or other illness as expressed by the patient.    Interventions:  . Inter-disciplinary care team collaboration (see longitudinal plan of care) . Evaluation of current treatment plan related to current treatment at home for sx/sx of respiratory issues and patient's adherence to plan as established by provider. . Advised patient to seek care at the urgent care due to issues at the office with Internet connections and her pcp being out of town. 01-25-2020: The patient states she could not even get into the urgent care.  The patient states that she is feeling much better.  She has a productive cough with yellow sputum.  Advised the  patient if she notices change in color of green or brown to call the office for a sooner appointment than 02-07-2020 with the pcp. The patient states that she is not running a fever and she feels much better. She denies any acute distress.  Verbalized understanding. 03-13-2020: The patient has not further issues with URI or COVID symptoms. The patient states she is feeling great. Goal being completed.  . Provided education to patient re: being evaluated to determine the source of her sx/sx: The patient is reporting productive cough and being sick over the weekend. Is having a harder time at night when she lays down to go to sleep. 01-25-2020: The patient is still having a productive cough but not having issues with shortness of breath. States her family is concerned that she may have COVID but the patient does not feel like she does. She states that she feels like she was just having some sinus drainage. Will call the office if she has a change in her condition.  . Discussed plans with patient for ongoing care management follow up and provided patient with direct contact information for care management team  Patient Self Care Activities:  . Patient verbalizes understanding of plan to seek care at an urgent care for acute sx/sx of upper respiratory infection vs ?COVID19 . Calls provider office for new concerns or questions . Unable to  independently determine what is causing her sx/sx and change in respiratory status and questioning if she possibly has COVID 19 and should be tested.   Please see past updates related to this goal by clicking on the "Past Updates" button in the selected goal          Plan:   Telephone follow up appointment with care management team member scheduled for: 05-22-2020 at 0945 am.    Noreene Larsson RN, MSN, [facility] Mobile: [phone]

## 2020-03-19 ENCOUNTER — Other Ambulatory Visit: Payer: Self-pay

## 2020-03-19 ENCOUNTER — Ambulatory Visit: Payer: Medicare Other

## 2020-03-19 ENCOUNTER — Ambulatory Visit (INDEPENDENT_AMBULATORY_CARE_PROVIDER_SITE_OTHER): Payer: Medicare Other

## 2020-03-19 DIAGNOSIS — E538 Deficiency of other specified B group vitamins: Secondary | ICD-10-CM

## 2020-03-20 ENCOUNTER — Ambulatory Visit: Payer: Medicare Other | Admitting: Nurse Practitioner

## 2020-03-26 ENCOUNTER — Ambulatory Visit (INDEPENDENT_AMBULATORY_CARE_PROVIDER_SITE_OTHER): Payer: Medicare Other

## 2020-03-26 VITALS — Ht 62.0 in | Wt 156.0 lb

## 2020-03-26 DIAGNOSIS — Z0000 Encounter for general adult medical examination without abnormal findings: Secondary | ICD-10-CM | POA: Diagnosis not present

## 2020-03-26 NOTE — Patient Instructions (Signed)
Tina Mcdonald , Thank you for taking time to come for your Medicare Wellness Visit. I appreciate your ongoing commitment to your health goals. Please review the following plan we discussed and let me know if I can assist you in the future.   Screening recommendations/referrals: Colonoscopy: completed 01/17/2019 Mammogram: due Bone Density: completed 02/20/2016 Recommended yearly ophthalmology/optometry visit for glaucoma screening and checkup Recommended yearly dental visit for hygiene and checkup  Vaccinations: Influenza vaccine: decline Pneumococcal vaccine: completed 10/12/2013 Tdap vaccine: due Shingles vaccine:  discussed   Covid-19: 08/22/2019, 08/01/2019  Advanced directives: Please bring a copy of your POA (Power of Attorney) and/or Living Will to your next appointment.   Conditions/risks identified: none  Next appointment: Follow up in one year for your annual wellness visit    Preventive Care 65 Years and Older, Female Preventive care refers to lifestyle choices and visits with your health care provider that can promote health and wellness. What does preventive care include?  A yearly physical exam. This is also called an annual well check.  Dental exams once or twice a year.  Routine eye exams. Ask your health care provider how often you should have your eyes checked.  Personal lifestyle choices, including:  Daily care of your teeth and gums.  Regular physical activity.  Eating a healthy diet.  Avoiding tobacco and drug use.  Limiting alcohol use.  Practicing safe sex.  Taking low-dose aspirin every day.  Taking vitamin and mineral supplements as recommended by your health care provider. What happens during an annual well check? The services and screenings done by your health care provider during your annual well check will depend on your age, overall health, lifestyle risk factors, and family history of disease. Counseling  Your health care provider may ask you  questions about your:  Alcohol use.  Tobacco use.  Drug use.  Emotional well-being.  Home and relationship well-being.  Sexual activity.  Eating habits.  History of falls.  Memory and ability to understand (cognition).  Work and work Statistician.  Reproductive health. Screening  You may have the following tests or measurements:  Height, weight, and BMI.  Blood pressure.  Lipid and cholesterol levels. These may be checked every 5 years, or more frequently if you are over 61 years old.  Skin check.  Lung cancer screening. You may have this screening every year starting at age 57 if you have a 30-pack-year history of smoking and currently smoke or have quit within the past 15 years.  Fecal occult blood test (FOBT) of the stool. You may have this test every year starting at age 107.  Flexible sigmoidoscopy or colonoscopy. You may have a sigmoidoscopy every 5 years or a colonoscopy every 10 years starting at age 77.  Hepatitis C blood test.  Hepatitis B blood test.  Sexually transmitted disease (STD) testing.  Diabetes screening. This is done by checking your blood sugar (glucose) after you have not eaten for a while (fasting). You may have this done every 1-3 years.  Bone density scan. This is done to screen for osteoporosis. You may have this done starting at age 81.  Mammogram. This may be done every 1-2 years. Talk to your health care provider about how often you should have regular mammograms. Talk with your health care provider about your test results, treatment options, and if necessary, the need for more tests. Vaccines  Your health care provider may recommend certain vaccines, such as:  Influenza vaccine. This is recommended every year.  Tetanus, diphtheria, and acellular pertussis (Tdap, Td) vaccine. You may need a Td booster every 10 years.  Zoster vaccine. You may need this after age 14.  Pneumococcal 13-valent conjugate (PCV13) vaccine. One dose is  recommended after age 73.  Pneumococcal polysaccharide (PPSV23) vaccine. One dose is recommended after age 26. Talk to your health care provider about which screenings and vaccines you need and how often you need them. This information is not intended to replace advice given to you by your health care provider. Make sure you discuss any questions you have with your health care provider. Document Released: 07/06/2015 Document Revised: 02/27/2016 Document Reviewed: 04/10/2015 Elsevier Interactive Patient Education  2017 [place] Prevention in the Home Falls can cause injuries. They can happen to people of all ages. There are many things you can do to make your home safe and to help prevent falls. What can I do on the outside of my home?  Regularly fix the edges of walkways and driveways and fix any cracks.  Remove anything that might make you trip as you walk through a door, such as a raised step or threshold.  Trim any bushes or trees on the path to your home.  Use bright outdoor lighting.  Clear any walking paths of anything that might make someone trip, such as rocks or tools.  Regularly check to see if handrails are loose or broken. Make sure that both sides of any steps have handrails.  Any raised decks and porches should have guardrails on the edges.  Have any leaves, snow, or ice cleared regularly.  Use sand or salt on walking paths during winter.  Clean up any spills in your garage right away. This includes oil or grease spills. What can I do in the bathroom?  Use night lights.  Install grab bars by the toilet and in the tub and shower. Do not use towel bars as grab bars.  Use non-skid mats or decals in the tub or shower.  If you need to sit down in the shower, use a plastic, non-slip stool.  Keep the floor dry. Clean up any water that spills on the floor as soon as it happens.  Remove soap buildup in the tub or shower regularly.  Attach bath mats  securely with double-sided non-slip rug tape.  Do not have throw rugs and other things on the floor that can make you trip. What can I do in the bedroom?  Use night lights.  Make sure that you have a light by your bed that is easy to reach.  Do not use any sheets or blankets that are too big for your bed. They should not hang down onto the floor.  Have a firm chair that has side arms. You can use this for support while you get dressed.  Do not have throw rugs and other things on the floor that can make you trip. What can I do in the kitchen?  Clean up any spills right away.  Avoid walking on wet floors.  Keep items that you use a lot in easy-to-reach places.  If you need to reach something above you, use a strong step stool that has a grab bar.  Keep electrical cords out of the way.  Do not use floor polish or wax that makes floors slippery. If you must use wax, use non-skid floor wax.  Do not have throw rugs and other things on the floor that can make you trip. What can I do  with my stairs?  Do not leave any items on the stairs.  Make sure that there are handrails on both sides of the stairs and use them. Fix handrails that are broken or loose. Make sure that handrails are as long as the stairways.  Check any carpeting to make sure that it is firmly attached to the stairs. Fix any carpet that is loose or worn.  Avoid having throw rugs at the top or bottom of the stairs. If you do have throw rugs, attach them to the floor with carpet tape.  Make sure that you have a light switch at the top of the stairs and the bottom of the stairs. If you do not have them, ask someone to add them for you. What else can I do to help prevent falls?  Wear shoes that:  Do not have high heels.  Have rubber bottoms.  Are comfortable and fit you well.  Are closed at the toe. Do not wear sandals.  If you use a stepladder:  Make sure that it is fully opened. Do not climb a closed  stepladder.  Make sure that both sides of the stepladder are locked into place.  Ask someone to hold it for you, if possible.  Clearly mark and make sure that you can see:  Any grab bars or handrails.  First and last steps.  Where the edge of each step is.  Use tools that help you move around (mobility aids) if they are needed. These include:  Canes.  Walkers.  Scooters.  Crutches.  Turn on the lights when you go into a dark area. Replace any light bulbs as soon as they burn out.  Set up your furniture so you have a clear path. Avoid moving your furniture around.  If any of your floors are uneven, fix them.  If there are any pets around you, be aware of where they are.  Review your medicines with your doctor. Some medicines can make you feel dizzy. This can increase your chance of falling. Ask your doctor what other things that you can do to help prevent falls. This information is not intended to replace advice given to you by your health care provider. Make sure you discuss any questions you have with your health care provider. Document Released: 04/05/2009 Document Revised: 11/15/2015 Document Reviewed: 07/14/2014 Elsevier Interactive Patient Education  2017 [employer].

## 2020-03-26 NOTE — Progress Notes (Signed)
I connected with [name] today by telephone and verified that I am speaking with the correct person using two identifiers. Location patient: home Location provider: work Persons participating in the virtual visit: [name], Glenna Durand LPN.   I discussed the limitations, risks, security and privacy concerns of performing an evaluation and management service by telephone and the availability of in person appointments. I also discussed with the patient that there may be a patient responsible charge related to this service. The patient expressed understanding and verbally consented to this telephonic visit.    Interactive audio and video telecommunications were attempted between this provider and patient, however failed, due to patient having technical difficulties OR patient did not have access to video capability.  We continued and completed visit with audio only.     Vital signs may be patient reported or missing.  Subjective:   NIHAL DOAN is a 79 y.o. female who presents for Medicare Annual (Subsequent) preventive examination.  Review of Systems     Cardiac Risk Factors include: advanced age (>29mn, >>40women);dyslipidemia;hypertension;obesity (BMI >30kg/m2);sedentary lifestyle     Objective:    Today's Vitals   03/26/20 1427  Weight: 156 lb (70.8 kg)  Height: '5\' 2"'  (1.575 m)   Body mass index is 28.53 kg/m.  Advanced Directives 03/26/2020 12/12/2019 09/24/2019 04/19/2019 03/14/2019 01/17/2019 12/14/2018  Does Patient Have a Medical Advance Directive? Yes Yes Yes No Yes Yes Yes  Type of [place]amed[place]of [place] will Living will;Healthcare Power of [place] - Living will;Healthcare Power of [place] Does patient want to make changes to medical advance directive? - - - - - No - Patient declined -  Copy of [place] Chart? - - - - No - copy requested No - copy requested No - copy requested    Current Medications (verified) Outpatient Encounter Medications as of 03/26/2020  Medication Sig  . acetaminophen (TYLENOL) 325 MG tablet Take 650 mg by mouth every 6 (six) hours as needed.  .Marland Kitchenatorvastatin (LIPITOR) 80 MG tablet Take 1 tablet (80 mg total) by mouth daily.  . blood glucose meter kit and supplies KIT Dispense based on patient and insurance preference. Use up to four times daily as directed. (FOR ICD-9 250.00, 250.01).  . ciclopirox (PENLAC) 8 % solution Apply topically at bedtime. Apply over nail and surrounding skin. Apply daily over previous coat. After seven (7) days, may remove with alcohol and continue cycle.  . ciclopirox (PENLAC) 8 % solution Apply topically at bedtime. Apply over nail and surrounding skin. Apply daily over previous coat. After seven (7) days, may remove with alcohol and continue cycle.  . cloNIDine (CATAPRES) 0.1 MG tablet Take 0.1 mg by mouth 2 (two) times daily.  . diclofenac sodium (VOLTAREN) 1 % GEL Apply 2 g topically 2 (two) times daily as needed.  . Ferrous Sulfate (SLOW FE PO) Take 1 tablet by mouth daily.  . fluconazole (DIFLUCAN) 200 MG tablet Take 1 tablet (200 mg total) by mouth once a week.  . gabapentin (NEURONTIN) 100 MG capsule Take 3 capsules (300 mg total) by mouth at bedtime. (Patient taking differently: Take 200 mg by mouth at bedtime. )  . glucose blood test strip Check daily. ICD 10 E11.9  . hydrochlorothiazide (HYDRODIURIL) 25 MG tablet Take 1 tablet (25 mg total) by mouth daily.  . metFORMIN (GLUCOPHAGE-XR) 500 MG 24 hr tablet  Take 1 tablet (500 mg total) by mouth 2 (two) times daily.  . metoprolol succinate (TOPROL-XL) 50 MG 24 hr tablet Take 1 tablet (50 mg total) by mouth daily. Take with or immediately following a meal.  . omeprazole (PRILOSEC) 20 MG capsule Take 1 capsule (20 mg total) by mouth daily.  . vitamin C (ASCORBIC ACID) 250 MG  tablet Take 2 tablets (500 mg total) by mouth daily.  Corrie Dandy (KERYDIN) 5 % SOLN Apply to fingernails every night. (Patient not taking: Reported on 03/26/2020)   Facility-Administered Encounter Medications as of 03/26/2020  Medication  . cyanocobalamin ((VITAMIN B-12)) injection 1,000 mcg    Allergies (verified) Contrast media [iodinated diagnostic agents], Penicillins, Sulfa antibiotics, Latex, Neosporin [neomycin-bacitracin zn-polymyx], and Other   History: Past Medical History:  Diagnosis Date  . Anemia   . Arthritis   . Chronic kidney disease   . Colon polyps   . Diabetes mellitus without complication ([place])   . Diabetic neuropathy ([place])   . Dyspnea   . GERD (gastroesophageal reflux disease)   . Hyperlipemia   . Hypertension   . Osteoarthritis   . Radiculopathy    weakness in right leg  . Sleep apnea    does not have her cpap anymore  . Stress incontinence   . Urticaria   . Vitamin B 12 deficiency   . Wears glasses    Past Surgical History:  Procedure Laterality Date  . ABDOMINAL HYSTERECTOMY    . APPENDECTOMY    . BREAST BIOPSY Right    right-neg  . BREAST BIOPSY Left    us/bx/clip-neg  . CARPAL TUNNEL RELEASE     right  . COLONOSCOPY    . COLONOSCOPY WITH PROPOFOL N/A 12/10/2015   Procedure: COLONOSCOPY WITH PROPOFOL;  Surgeon: Lucilla Lame, MD;  Location: [place];  Service: Endoscopy;  Laterality: N/A;  Diabetic - oral meds Sleep Apnea LATEX allergy  . COLONOSCOPY WITH PROPOFOL N/A 01/17/2019   Procedure: COLONOSCOPY WITH PROPOFOL;  Surgeon: Virgel Manifold, MD;  Location: [place];  Service: Endoscopy;  Laterality: N/A;  . CYSTOSCOPY    . DILATION AND CURETTAGE OF UTERUS    . ESOPHAGOGASTRODUODENOSCOPY (EGD) WITH PROPOFOL N/A 01/17/2019   Procedure: ESOPHAGOGASTRODUODENOSCOPY (EGD) WITH PROPOFOL;  Surgeon: Virgel Manifold, MD;  Location: [place];  Service: Endoscopy;  Laterality: N/A;  . EYE SURGERY Bilateral     lens implant  . GIVENS CAPSULE STUDY N/A 05/04/2019   Procedure: GIVENS CAPSULE STUDY;  Surgeon: Virgel Manifold, MD;  Location: [facility];  Service: Endoscopy;  Laterality: N/A;  . LUMBAR FUSION  2009  . POLYPECTOMY  12/10/2015   Procedure: POLYPECTOMY;  Surgeon: Lucilla Lame, MD;  Location: [place];  Service: Endoscopy;;  . POLYPECTOMY  01/17/2019   Procedure: POLYPECTOMY;  Surgeon: Virgel Manifold, MD;  Location: [place];  Service: Endoscopy;;  . RESECTION DISTAL CLAVICAL Right 11/25/2013   Procedure: RIGHT SHOULDER OPEN RESECTION DISTAL CLAVICAL EXCISION SOFT TISSUE TUMOR SHOULDER DEEP SUBFASCIAL INTRAMUSCULAR/DEBRIDEMENT/ROTATOR CUFF REPAIR/BICEPS TENODESIS/MANIPULATION;  Surgeon: Renette Butters, MD;  Location: [place];  Service: Orthopedics;  Laterality: Right;  . SHOULDER ARTHROSCOPY WITH ROTATOR CUFF REPAIR AND SUBACROMIAL DECOMPRESSION Right 11/25/2013   Procedure: SHOULDER ARTHROSCOPY WITH DEBRIDEMENT ROTATOR CUFF REPAIR  A;  Surgeon: Renette Butters, MD;  Location: [place];  Service: Orthopedics;  Laterality: Right;   Family History  Problem Relation Age of Onset  . Heart disease Mother   .  Diabetes Mother   . Hypertension Mother   . Heart disease Father   . Hypertension Father   . Diabetes Sister   . Hypertension Sister   . Heart disease Sister   . Kidney disease Sister   . Diabetes Sister   . Hypertension Sister   . Breast cancer Cousin 82  . Non-Hodgkin's lymphoma Maternal Uncle    Social History   Socioeconomic History  . Marital status: Single    Spouse name: Not on file  . Number of children: Not on file  . Years of education: Not on file  . Highest education level: Some college, no degree  Occupational History  . Occupation: retired  Tobacco Use  . Smoking status: Former Smoker    Packs/day: 1.00    Years: 15.00    Pack years: 15.00    Quit date: 11/22/1997    Years since quitting:  22.3  . Smokeless tobacco: Never Used  Vaping Use  . Vaping Use: Never used  Substance and Sexual Activity  . Alcohol use: No  . Drug use: No  . Sexual activity: Not Currently  Other Topics Concern  . Not on file  Social History Narrative  . Not on file   Social Determinants of Health   Financial Resource Strain: Low Risk   . Difficulty of Paying Living Expenses: Not hard at all  Food Insecurity: No Food Insecurity  . Worried About Charity fundraiser in the Last Year: Never true  . Ran Out of Food in the Last Year: Never true  Transportation Needs: No Transportation Needs  . Lack of Transportation (Medical): No  . Lack of Transportation (Non-Medical): No  Physical Activity: Inactive  . Days of Exercise per Week: 0 days  . Minutes of Exercise per Session: 0 min  Stress: No Stress Concern Present  . Feeling of Stress : Not at all  Social Connections: Unknown  . Frequency of Communication with Friends and Family: More than three times a week  . Frequency of Social Gatherings with Friends and Family: More than three times a week  . Attends Religious Services: More than 4 times per year  . Active Member of Clubs or Organizations: Yes  . Attends Archivist Meetings: More than 4 times per year  . Marital Status: Not on file    Tobacco Counseling Counseling given: Not Answered   Clinical Intake:  Pre-visit preparation completed: Yes  Pain : No/denies pain     Nutritional Status: BMI 25 -29 Overweight Nutritional Risks: None Diabetes: Yes  How often do you need to have someone help you when you read instructions, pamphlets, or other written materials from your doctor or pharmacy?: 1 - Never What is the last grade level you completed in school?: 12th grade  Diabetic? Yes Nutrition Risk Assessment:  Has the patient had any N/V/D within the last 2 months?  No  Does the patient have any non-healing wounds?  No  Has the patient had any unintentional weight  loss or weight gain?  Yes   Diabetes:  Is the patient diabetic?  Yes  If diabetic, was a CBG obtained today?  No  Did the patient bring in their glucometer from home?  No  How often do you monitor your CBG's? weekly.   Financial Strains and Diabetes Management:  Are you having any financial strains with the device, your supplies or your medication? No .  Does the patient want to be seen by Chronic Care Management for  management of their diabetes?  No  Would the patient like to be referred to a Nutritionist or for Diabetic Management?  No   Diabetic Exams:  Diabetic Eye Exam: Completed 04/14/2019 Diabetic Foot Exam: Completed 12/02/2019  Interpreter Needed?: No  Information entered by :: NAllen LPN   Activities of Daily Living In your present state of health, do you have any difficulty performing the following activities: 03/26/2020  Hearing? N  Vision? Y  Comment night vision, cataracts  Difficulty concentrating or making decisions? N  Walking or climbing stairs? Y  Dressing or bathing? N  Doing errands, shopping? N  Preparing Food and eating ? N  Using the Toilet? N  In the past six months, have you accidently leaked urine? Y  Comment wears a pad  Do you have problems with loss of bowel control? N  Managing your Medications? N  Managing your Finances? N  Housekeeping or managing your Housekeeping? N  Some recent data might be hidden    Patient Care Team: [name], NP as PCP - General (Nurse Practitioner) Yolonda Kida, MD (Internal Medicine) Vanita Ingles, RN as Case Manager (General Practice)  Indicate any recent Medical Services you may have received from other than [facility] providers in the past year (date may be approximate).     Assessment:   This is a routine wellness examination for Teandra.  Hearing/Vision screen  Hearing Screening   '125Hz'  '250Hz'  '500Hz'  '1000Hz'  '2000Hz'  '3000Hz'  '4000Hz'  '6000Hz'  '8000Hz'   Right ear:           Left ear:            Vision Screening Comments: Regular eye exams, Dr. Gloriann Loan  Dietary issues and exercise activities discussed: Current Exercise Habits: The patient does not participate in regular exercise at present  Goals    .  Increase water intake      Recommend drinking at least 6-7 glasses of water a day     .  Patient Stated      03/26/2020, no goals    .  PharmD "I want to take care of my medications" (pt-stated)      CARE PLAN ENTRY (see longtitudinal plan of care for additional care plan information)  Current Barriers:  . Polypharmacy; complex patient with multiple comorbidities including T2DM, HTN, anemia, CKD, OA, GERD o S/p visit on Monday for diarrhea. Notes it has improved somewhat, but she has not had a chance to go by the pharmacy yet to pick up metronidazole/cipro, as she been taking of her sister after having surgery on both eyes last week. Notes that PCP ordered labwork for her, but she has not been contacted to schedule this or f/u with PCP. She is unsure if she is supposed to have labwork now, or when she next sees PCP. [name] Self-manages medications.  o Anemia: slow FE + Vitamin C 500 mg daily; reports that she is taking 2 250 mg tabs, wonders if she can take 1 500 mg tab o T2DM; controlled, last A1c 6.2%; metformin 500 mg BID; o ASCVD risk reduction:  - HTN: HCTZ 25 mg daily, clonidine 0.1 mg BID, metoprolol succinate 50 mg daily; is not checking BP at home as she does not have a cuff; wonders if this is something she can purchase from [employer] - HLD: atorvastatin 80 mg daily, except Saturdays when she takes fluconazole as per Dr. Nicole Kindred, dermatology; last LDL not at goal <100, however, she had been holding atorvastatin during concurrent  fluconazole therapy. Now sh o GERD: omeprazole 20 mg daily  o OA: gabapentin 200 mg QPM, diclofenac gel PRN to knees. Acetaminophen 650 mg PRN. Wonders how often she can combine APAP and gabapentin o Nail fungus: fluconazole 200 mg every  Saturday, ciclopirox lacquer. Has f/u with Dr. Nicole Kindred in mid August, but only has 1 more pill of fluconazole. Wonders if she is to continue therapy  o Vitamin B 12 deficiency. Notes she needs to schedule her injection for this month  Pharmacist Clinical Goal(s):  [name] Over the next 90 days, patient will work with PharmD and provider towards optimized medication management  Interventions: . Comprehensive medication review performed, medication list updated in electronic medical record . Inter-disciplinary care team collaboration (see longitudinal plan of care) . Praised for maintenance of goal A1c. Encouraged continued focus on adherence to medications and a low carbohydrate diet.  . Discussed goal LDL <70 given DM and ASCVD risk factors. Atorvastatin dosing has not been regular over the past few months d/t concurrent fluconazole therapy. Recommend recheck in a few months, now that patient is taking atorvastaitn 80 mg 6 days per week.  . Reviewed proper BP checking technique. Discussed Over the Counter benefits through [facility], encouraged patient to pursue purchasing an arm BP cuff with these benefits. Patient verbalized understanding . Discussed max daily APAP dose of 4000 mg daily. Discussed that she can combine w/ gabapentin, or she can take APAP during the day and gabapentin at night to avoid daytime sedation. Recommended she avoid APAP PM.  Patient verbalized understanding.  . Will collaborate w/ PCP and clinical staff regarding plan for patient to schedule lab work, PCP f/u, and Vitamin B12 injection . Will message Dr. Nicole Kindred to see if patient is to continue fluconazole until she sees her for f/u on 02/07/20, and if so, to request a refill be sent to [facility]. . Reviewed role of RN CM on team and discussed upcoming outreach appointment  Patient Self Care Activities:  . Patient will take medications as prescribed  Please see past updates related to this goal by clicking on the "Past Updates" button  in the selected goal      .  RNCM: Chronic Disease Management and Care Coordination      CARE PLAN ENTRY (see longtitudinal plan of care for additional care plan information)  Current Barriers:  . Chronic Disease Management support, education, and care coordination needs related to HTN, HLD, DMII, and CKD Stage 3 . Does not have a blood pressure cuff . Does not have a working scale: verbalized weight loss  Clinical Goal(s) related to HTN, HLD, DMII, and CKD Stage 3 :  Over the next 120 days, patient will:  . Work with the care management team to address educational, disease management, and care coordination needs  . Begin or continue self health monitoring activities as directed today Measure and record cbg (blood glucose) 1 times daily, Measure and record blood pressure 2/3 times per week, and adhere to a heart healthy/ADA diet  . Call provider office for new or worsened signs and symptoms Blood glucose findings outside established parameters, Blood pressure findings outside established parameters, Shortness of breath, and New or worsened symptom related to HLD/CKD and other chronic conditions . Call care management team with questions or concerns . Verbalize basic understanding of patient centered plan of care established today  Interventions related to HTN, HLD, DMII, and CKD Stage 3 :  . Evaluation of current treatment plans and patient's adherence to plan  as established by provider.  The patient states she does not take her blood sugars daily but when she does check them they are good. Last couple of readings were 108 and 112.  The patient is compliant with recommendations by the provider. 03-13-2020: The patient is doing well. Is maintaining healthy diabetic health. She states that her eating has been a little off last week as her friends husband passed away and she has had different things to eat than she normally does. She is back on track this week.  Empathetic listening and support  given. Denies any new concerns related to DM/HTN/HLD/CKD3 . Assessed patient understanding of disease states.  Has a good understanding of chronic conditions and how to effectively manage. 03-13-2020: The patient ask good questions. The patient denies any new concerns at this time with chronic conditions. Is having an issue with nail fungus. Has fungus on her big thumb. She also went to a podiatrist recently and she was not pleased with her care there. She states the podiatrist did not have bedside manner and he did a poor job of cutting her nails. She says she will not go back to him.  Education on writing questions down to ask pcp at next visit on 03-20-2020.  The patient has follow up pcp visit for that day.  . Assessed patient's education and care coordination needs.  Evaluation of ability to take blood pressure. The patient does not have a bp cuff. Education on the OTC benefit through [facility] and the ability to get a blood pressure cuff through the benefit. The patient will check into this.  . Provided disease specific education to patient.  Education on following heart healthy/ADA diet, having sugar snacks on hand in the event of hypoglycemic event. Also reviewed the s/s of hyperglycemia and hypoglycemia with the patient . Collaborated with appropriate clinical care team members regarding patient needs.  The patient knows about resources and has support from the [facility] team pharmacist . Evaluation of next appointment with pcp: 03-20-2020 at 120 pm.  Education on writing questions down to ask the pcp at her appointment. Has AWV call for 03-19-2020.    Patient Self Care Activities related to HTN, HLD, DMII, and CKD Stage 3 :  . Patient is unable to independently self-manage chronic health conditions  Please see past updates related to this goal by clicking on the "Past Updates" button in the selected goal        Depression Screen PHQ 2/9 Scores 03/26/2020 01/25/2020 08/23/2019 03/04/2018 12/08/2017 02/05/2017  11/24/2016  PHQ - 2 Score 0 0 0 0 0 0 0  PHQ- 9 Score - - - - - - 4    Fall Risk Fall Risk  03/26/2020 02/07/2020 03/04/2018 12/08/2017 02/05/2017  Falls in the past year? 0 0 No No Yes  Number falls in past yr: - 0 - - 1  Injury with Fall? - 0 - - No  Risk for fall due to : Medication side effect - - - -  Follow up Falls evaluation completed;Education provided;Falls prevention discussed Falls evaluation completed - - Falls prevention discussed    Any stairs in or around the home? No  If so, are there any without handrails? n/a Home free of loose throw rugs in walkways, pet beds, electrical cords, etc? Yes  Adequate lighting in your home to reduce risk of falls? Yes   ASSISTIVE DEVICES UTILIZED TO PREVENT FALLS:  Life alert? No  Use of a cane, walker or w/c? No  Grab bars in the bathroom? No  Shower chair or bench in shower? No  Elevated toilet seat or a handicapped toilet? Yes   TIMED UP AND GO:  Was the test performed? No .     Cognitive Function:     6CIT Screen 03/26/2020 03/04/2018 02/05/2017  What Year? 0 points 0 points 0 points  What month? 0 points 0 points 0 points  What time? 0 points 0 points 0 points  Count back from 20 0 points 0 points 0 points  Months in reverse 0 points 0 points 0 points  Repeat phrase 0 points 0 points 0 points  Total Score 0 0 0    Immunizations Immunization History  Administered Date(s) Administered  . PFIZER SARS-COV-2 Vaccination 08/01/2019, 08/22/2019  . Pneumococcal Conjugate-13 10/12/2013  . Pneumococcal Polysaccharide-23 05/28/2010  . Td 10/02/2006    TDAP status: Due, Education has been provided regarding the importance of this vaccine. Advised may receive this vaccine at local pharmacy or Health Dept. Aware to provide a copy of the vaccination record if obtained from local pharmacy or Health Dept. Verbalized acceptance and understanding. Flu Vaccine status: Declined, Education has been provided regarding the importance of this  vaccine but patient still declined. Advised may receive this vaccine at local pharmacy or Health Dept. Aware to provide a copy of the vaccination record if obtained from local pharmacy or Health Dept. Verbalized acceptance and understanding. Pneumococcal vaccine status: Up to date Covid-19 vaccine status: Completed vaccines  Qualifies for Shingles Vaccine? Yes   Zostavax completed No   Shingrix Completed?: No.    Education has been provided regarding the importance of this vaccine. Patient has been advised to call insurance company to determine out of pocket expense if they have not yet received this vaccine. Advised may also receive vaccine at local pharmacy or Health Dept. Verbalized acceptance and understanding.  Screening Tests Health Maintenance  Topic Date Due  . TETANUS/TDAP  10/01/2016  . INFLUENZA VACCINE  09/20/2020 (Originally 01/22/2020)  . OPHTHALMOLOGY EXAM  04/13/2020  . HEMOGLOBIN A1C  06/02/2020  . FOOT EXAM  12/01/2020  . DEXA SCAN  Completed  . COVID-19 Vaccine  Completed  . Hepatitis C Screening  Completed  . PNA vac Low Risk Adult  Completed    Health Maintenance  Health Maintenance Due  Topic Date Due  . TETANUS/TDAP  10/01/2016    Colorectal cancer screening: Completed 01/17/2019. Repeat every 5 years Mammogram status: No longer required.  Bone Density status: Completed 02/20/2016.   Lung Cancer Screening: (Low Dose CT Chest recommended if Age 64-80 years, 30 pack-year currently smoking OR have quit w/in 15years.) does not qualify.   Lung Cancer Screening Referral: no  Additional Screening:  Hepatitis C Screening: does qualify; Completed 12/02/2019  Vision Screening: Recommended annual ophthalmology exams for early detection of glaucoma and other disorders of the eye. Is the patient up to date with their annual eye exam?  Yes  Who is the provider or what is the name of the office in which the patient attends annual eye exams? Dr. Gloriann Loan If pt is not  established with a provider, would they like to be referred to a provider to establish care? No .   Dental Screening: Recommended annual dental exams for proper oral hygiene  Community Resource Referral / Chronic Care Management: CRR required this visit?  No   CCM required this visit?  No      Plan:     I have personally reviewed and  noted the following in the patient's chart:   . Medical and social history . Use of alcohol, tobacco or illicit drugs  . Current medications and supplements . Functional ability and status . Nutritional status . Physical activity . Advanced directives . List of other physicians . Hospitalizations, surgeries, and ER visits in previous 12 months . Vitals . Screenings to include cognitive, depression, and falls . Referrals and appointments  In addition, I have reviewed and discussed with patient certain preventive protocols, quality metrics, and best practice recommendations. A written personalized care plan for preventive services as well as general preventive health recommendations were provided to patient.     Kellie Simmering, LPN   00/02/4178   Nurse Notes:

## 2020-04-13 ENCOUNTER — Encounter: Payer: Self-pay | Admitting: Nurse Practitioner

## 2020-04-13 ENCOUNTER — Other Ambulatory Visit: Payer: Self-pay

## 2020-04-13 ENCOUNTER — Ambulatory Visit (INDEPENDENT_AMBULATORY_CARE_PROVIDER_SITE_OTHER): Payer: Medicare Other | Admitting: Nurse Practitioner

## 2020-04-13 VITALS — BP 128/72 | HR 81 | Temp 98.8°F | Resp 16 | Ht 61.0 in | Wt 158.2 lb

## 2020-04-13 DIAGNOSIS — E785 Hyperlipidemia, unspecified: Secondary | ICD-10-CM

## 2020-04-13 DIAGNOSIS — I152 Hypertension secondary to endocrine disorders: Secondary | ICD-10-CM

## 2020-04-13 DIAGNOSIS — E538 Deficiency of other specified B group vitamins: Secondary | ICD-10-CM

## 2020-04-13 DIAGNOSIS — E1122 Type 2 diabetes mellitus with diabetic chronic kidney disease: Secondary | ICD-10-CM | POA: Diagnosis not present

## 2020-04-13 DIAGNOSIS — E1169 Type 2 diabetes mellitus with other specified complication: Secondary | ICD-10-CM

## 2020-04-13 DIAGNOSIS — I700 Atherosclerosis of aorta: Secondary | ICD-10-CM | POA: Diagnosis not present

## 2020-04-13 DIAGNOSIS — N183 Chronic kidney disease, stage 3 unspecified: Secondary | ICD-10-CM | POA: Diagnosis not present

## 2020-04-13 DIAGNOSIS — E1159 Type 2 diabetes mellitus with other circulatory complications: Secondary | ICD-10-CM | POA: Diagnosis not present

## 2020-04-13 DIAGNOSIS — G4733 Obstructive sleep apnea (adult) (pediatric): Secondary | ICD-10-CM

## 2020-04-13 DIAGNOSIS — D508 Other iron deficiency anemias: Secondary | ICD-10-CM

## 2020-04-13 LAB — BAYER DCA HB A1C WAIVED: HB A1C (BAYER DCA - WAIVED): 6.3 % (ref <7.0)

## 2020-04-13 MED ORDER — CICLOPIROX 8 % EX SOLN: Freq: Every day | CUTANEOUS | 1 refills | Status: DC

## 2020-04-13 MED ORDER — CYANOCOBALAMIN 1000 MCG/ML IJ SOLN
1000.0000 ug | Freq: Once | INTRAMUSCULAR | Status: AC
  Administered 2020-04-13: 1000 ug via INTRAMUSCULAR

## 2020-04-13 NOTE — Assessment & Plan Note (Signed)
Chronic, ongoing with today A1C 6.3% and last GFR 69.  Continue current medication regimen and renal dose Metformin in future if decline noted.  Recommend continue to check BS at home consistently and document.  Consider addition of ACE or ARB in future, will discuss with her next visit as unsure she has tried these before.  Return in 3 months.

## 2020-04-13 NOTE — Assessment & Plan Note (Addendum)
Continue monthly injections and obtain B12 level next visit.

## 2020-04-13 NOTE — Assessment & Plan Note (Signed)
Chronic, ongoing.  Continue current medication regimen and collaboration with hematology.  

## 2020-04-13 NOTE — Assessment & Plan Note (Signed)
Chronic, ongoing with BP at goal today. Continue to recommend checking BP at home a few mornings a week and documenting.  Continue current medication regimen and adjust as needed. BMP today. Would consider addition of ACE or ARB with d/c of HCTZ at next visit, will discuss with patient as unsure she has tried these before.  Return in 3 months.

## 2020-04-13 NOTE — Progress Notes (Signed)
BP 128/72 (BP Location: Left Arm, Patient Position: Sitting, Cuff Size: Normal)   Pulse 81   Temp 98.8 F (37.1 C) (Oral)   Resp 16   Ht 5\' 1"  (1.549 m)   Wt 158 lb 3.2 oz (71.8 kg)   LMP  (LMP Unknown)   SpO2 98%   BMI 29.89 kg/m    Subjective:    Patient ID: Tina Mcdonald, female    DOB: 06/24/1940, 79 y.o.   MRN: [id number]  HPI: Tina Mcdonald is a 79 y.o. female  Chief Complaint  Patient presents with  . Diabetes  . Hypertension  . Hyperlipidemia   DIABETES Continues on Metformin and Gabapentin. Last A1C 6.2% in June.Continues on B12 and Slow Fe for anemia, last saw Dr. 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 Has underlying OSA -- but could not afford cost of return to sleep study location or equipment.    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 Hypoglycemic episodes:no Polydipsia/polyuria:no Visual disturbance:no Chest pain:no Paresthesias:no Glucose Monitoring:yes Accucheck frequency: every other day Fasting glucose:110 to 120 Post prandial: Evening: Before meals: Taking Insulin?:no Long acting insulin: Short acting insulin: Blood Pressure Monitoring:not checking Retinal Examination:Up To Date -- Dr. Bell, [place] to Date Pneumovax:Up to Date Influenza:Up to Date Aspirin:no  HYPERTENSION / HYPERLIPIDEMIA Continues on HCTZ and Clonidine + Lipitor.Last saw Dr. Clayborn Bigness 03/22/2019.  Has history of aortic atherosclerosis noted on past CT 01/26/17. Satisfied with current treatment?yes Duration of hypertension:chronic BP monitoring frequency:once a week BP range: 120/70 range BP medication side effects:no Duration of hyperlipidemia:chronic Cholesterol medication side effects:no Cholesterol supplements: none Medication compliance:good  compliance Aspirin:no Recent stressors:no Recurrent headaches:no Visual changes:no Palpitations:no Dyspnea:no Chest pain:no Lower extremity edema:no Dizzy/lightheaded:no  Relevant past medical, surgical, family and social history reviewed and updated as indicated. Interim medical history since our last visit reviewed. Allergies and medications reviewed and updated.  Review of Systems  Constitutional: Negative for activity change, appetite change, diaphoresis, fatigue and fever.  Respiratory: Negative for cough, chest tightness, shortness of breath and wheezing.   Cardiovascular: Negative for chest pain, palpitations and leg swelling.  Gastrointestinal: Negative.   Endocrine: Negative for cold intolerance, heat intolerance, polydipsia, polyphagia and polyuria.  Neurological: Negative.   Psychiatric/Behavioral: Negative.     Per HPI unless specifically indicated above     Objective:    BP 128/72 (BP Location: Left Arm, Patient Position: Sitting, Cuff Size: Normal)   Pulse 81   Temp 98.8 F (37.1 C) (Oral)   Resp 16   Ht 5\' 1"  (1.549 m)   Wt 158 lb 3.2 oz (71.8 kg)   LMP  (LMP Unknown)   SpO2 98%   BMI 29.89 kg/m   Wt Readings from Last 3 Encounters:  04/13/20 158 lb 3.2 oz (71.8 kg)  03/26/20 156 lb (70.8 kg)  02/07/20 160 lb (72.6 kg)    Physical Exam Vitals and nursing note reviewed.  Constitutional:      General: She is awake. She is not in acute distress.    Appearance: She is well-developed, well-groomed and overweight. She is not ill-appearing.  HENT:     Head: Normocephalic.     Right Ear: Hearing normal.     Left Ear: Hearing normal.  Eyes:     General: Lids are normal.        Right eye: No discharge.        Left eye: No discharge.  Conjunctiva/sclera: Conjunctivae normal.     Pupils: Pupils are equal, round, and reactive to light.  Neck:     Thyroid: No thyromegaly.     Vascular: No carotid bruit.  Cardiovascular:     Rate and  Rhythm: Normal rate and regular rhythm.     Heart sounds: Normal heart sounds. No murmur heard.  No gallop.   Pulmonary:     Effort: Pulmonary effort is normal. No accessory muscle usage or respiratory distress.     Breath sounds: Normal breath sounds.  Abdominal:     General: Bowel sounds are normal.     Palpations: Abdomen is soft. There is no hepatomegaly or splenomegaly.  Musculoskeletal:     Cervical back: Normal range of motion and neck supple.     Right lower leg: No edema.     Left lower leg: No edema.  Lymphadenopathy:     Cervical: No cervical adenopathy.  Skin:    General: Skin is warm and dry.  Neurological:     Mental Status: She is alert and oriented to person, place, and time.  Psychiatric:        Attention and Perception: Attention normal.        Mood and Affect: Mood normal.        Speech: Speech normal.        Behavior: Behavior normal. Behavior is cooperative.        Thought Content: Thought content normal.    Diabetic Foot Exam - Simple   Simple Foot Form Visual Inspection No deformities, no ulcerations, no other skin breakdown bilaterally: Yes Sensation Testing Intact to touch and monofilament testing bilaterally: Yes Pulse Check Posterior Tibialis and Dorsalis pulse intact bilaterally: Yes Comments    Results for orders placed or performed in visit on 01/12/20  Comprehensive metabolic panel  Result Value Ref Range   Glucose 93 65 - 99 mg/dL   BUN 19 8 - 27 mg/dL   Creatinine, Ser 0.93 0.57 - 1.00 mg/dL   GFR calc non Af Amer 59 (L) >59 mL/min/1.73   GFR calc Af Amer 69 >59 mL/min/1.73   BUN/Creatinine Ratio 20 12 - 28   Sodium 144 134 - 144 mmol/L   Potassium 4.3 3.5 - 5.2 mmol/L   Chloride 107 (H) 96 - 106 mmol/L   CO2 24 20 - 29 mmol/L   Calcium 9.7 8.7 - 10.3 mg/dL   Total Protein 6.4 6.0 - 8.5 g/dL   Albumin 4.1 3.7 - 4.7 g/dL   Globulin, Total 2.3 1.5 - 4.5 g/dL   Albumin/Globulin Ratio 1.8 1.2 - 2.2   Bilirubin Total 0.2 0.0 - 1.2  mg/dL   Alkaline Phosphatase 68 48 - 121 IU/L   AST 16 0 - 40 IU/L   ALT 14 0 - 32 IU/L  CBC with Differential/Platelet  Result Value Ref Range   WBC 5.5 3.4 - 10.8 x10E3/uL   RBC 4.46 3.77 - 5.28 x10E6/uL   Hemoglobin 10.8 (L) 11.1 - 15.9 g/dL   Hematocrit 34.6 34.0 - 46.6 %   MCV 78 (L) 79 - 97 fL   MCH 24.2 (L) 26.6 - 33.0 pg   MCHC 31.2 (L) 31 - 35 g/dL   RDW 15.4 11.7 - 15.4 %   Platelets 220 150 - 450 x10E3/uL   Neutrophils 52 Not Estab. %   Lymphs 37 Not Estab. %   Monocytes 8 Not Estab. %   Eos 3 Not Estab. %   Basos 0 Not Estab. %  Neutrophils Absolute 2.8 1.40 - 7.00 x10E3/uL   Lymphocytes Absolute 2.0 0 - 3 x10E3/uL   Monocytes Absolute 0.4 0 - 0 x10E3/uL   EOS (ABSOLUTE) 0.2 0.0 - 0.4 x10E3/uL   Basophils Absolute 0.0 0 - 0 x10E3/uL   Immature Granulocytes 0 Not Estab. %   Immature Grans (Abs) 0.0 0.0 - 0.1 x10E3/uL      Assessment & Plan:   Problem List Items Addressed This Visit      Cardiovascular and Mediastinum   Hypertension associated with diabetes ([place])    Chronic, ongoing with BP at goal today. Continue to recommend checking BP at home a few mornings a week and documenting.  Continue current medication regimen and adjust as needed. BMP today. Would consider addition of ACE or ARB with d/c of HCTZ at next visit, will discuss with patient as unsure she has tried these before.  Return in 3 months.      Relevant Orders   Bayer DCA Hb A1c Waived   TSH   Aortic atherosclerosis (HCC)    Chronic, ongoing noted on imaging CT 01/26/17.  Continue daily statin and ASA for prevention and monitor closely.        Respiratory   Sleep apnea    Sleep study performed, could not afford to return to clinic or obtain equipment.  Will continue to work with CCM team on this.        Endocrine   Hyperlipidemia associated with type 2 diabetes mellitus (HCC)    Chronic, ongoing.  Continue current medication regimen and adjust as needed.  Lipid panel today.       Relevant Orders   Bayer DCA Hb A1c Waived   Lipid Panel w/o Chol/HDL Ratio   CKD stage 3 due to type 2 diabetes mellitus ([place]) - Primary    Chronic, ongoing with today A1C 6.3% and last GFR 69.  Continue current medication regimen and renal dose Metformin in future if decline noted.  Recommend continue to check BS at home consistently and document.  Consider addition of ACE or ARB in future, will discuss with her next visit as unsure she has tried these before.  Return in 3 months.      Relevant Orders   Basic metabolic panel   Bayer DCA Hb A1c Waived     Other   Iron deficiency anemia    Chronic, ongoing.  Continue current medication regimen and collaboration with hematology.       B12 deficiency    Continue monthly injections and obtain B12 level next visit.          Follow up plan: Return in about 3 months (around 07/14/2020) for Annual physical.

## 2020-04-13 NOTE — Patient Instructions (Signed)

## 2020-04-13 NOTE — Assessment & Plan Note (Signed)
Chronic, ongoing noted on imaging CT 01/26/17.  Continue daily statin and ASA for prevention and monitor closely. 

## 2020-04-13 NOTE — Assessment & Plan Note (Signed)
Sleep study performed, could not afford to return to clinic or obtain equipment.  Will continue to work with CCM team on this. 

## 2020-04-13 NOTE — Assessment & Plan Note (Signed)
Chronic, ongoing.  Continue current medication regimen and adjust as needed. Lipid panel today. 

## 2020-04-14 LAB — LIPID PANEL W/O CHOL/HDL RATIO
Cholesterol, Total: 183 mg/dL (ref 100–199)
HDL: 46 mg/dL (ref >39)
LDL Chol Calc (NIH): 115 mg/dL — ABNORMAL HIGH (ref 0–99)
Triglycerides: 125 mg/dL (ref 0–149)
VLDL Cholesterol Cal: 22 mg/dL (ref 5–40)

## 2020-04-14 LAB — BASIC METABOLIC PANEL
BUN/Creatinine Ratio: 19 (ref 12–28)
BUN: 18 mg/dL (ref 8–27)
CO2: 25 mmol/L (ref 20–29)
Calcium: 9.7 mg/dL (ref 8.7–10.3)
Chloride: 101 mmol/L (ref 96–106)
Creatinine, Ser: 0.97 mg/dL (ref 0.57–1.00)
GFR calc Af Amer: 65 mL/min/{1.73_m2} (ref >59)
GFR calc non Af Amer: 57 mL/min/{1.73_m2} — ABNORMAL LOW (ref >59)
Glucose: 118 mg/dL — ABNORMAL HIGH (ref 65–99)
Potassium: 4.3 mmol/L (ref 3.5–5.2)
Sodium: 141 mmol/L (ref 134–144)

## 2020-04-14 LAB — TSH: TSH: 0.709 u[IU]/mL (ref 0.450–4.500)

## 2020-04-15 NOTE — Progress Notes (Signed)
Please let Tina Mcdonald know her labs have returned and they continue at baseline.  We do not need to make changes to medications at this time.  I would like to check her cholesterol with her fasting at next visit please.  If any questions let me know.  Have a great day!! Keep being awesome!!  Thank you for allowing me to participate in your care. Kindest regards, Jaiyah Beining

## 2020-05-22 ENCOUNTER — Ambulatory Visit (INDEPENDENT_AMBULATORY_CARE_PROVIDER_SITE_OTHER): Payer: Medicare Other

## 2020-05-22 ENCOUNTER — Other Ambulatory Visit: Payer: Self-pay

## 2020-05-22 ENCOUNTER — Telehealth: Payer: Self-pay

## 2020-05-22 ENCOUNTER — Telehealth: Payer: Self-pay | Admitting: General Practice

## 2020-05-22 DIAGNOSIS — E538 Deficiency of other specified B group vitamins: Secondary | ICD-10-CM

## 2020-05-22 NOTE — Telephone Encounter (Signed)
  Chronic Care Management   Outreach Note  05/22/2020 Name: Tina Mcdonald MRN: [id number] DOB: [birth year]  Referred by: [name], NP Reason for referral : Chronic Care Management (RNCM Chronic Disease Management and Care Coordination Needs)   An unsuccessful telephone outreach was attempted today. The patient was referred to the case management team for assistance with care management and care coordination.   Follow Up Plan: A HIPAA compliant phone message was left for the patient providing contact information and requesting a return call.   Noreene Larsson RN, MSN, [facility] Mobile: [phone]

## 2020-05-28 ENCOUNTER — Other Ambulatory Visit: Payer: Self-pay

## 2020-05-28 ENCOUNTER — Ambulatory Visit (INDEPENDENT_AMBULATORY_CARE_PROVIDER_SITE_OTHER): Payer: Medicare Other | Admitting: Dermatology

## 2020-05-28 DIAGNOSIS — B351 Tinea unguium: Secondary | ICD-10-CM | POA: Diagnosis not present

## 2020-05-28 NOTE — Progress Notes (Addendum)
   Follow-Up Visit   Subjective  Tina Mcdonald is a 79 y.o. female who presents for the following: onychomycosis culture proven Candida Parapsilosis (fingernails, Fluconazole 200mg  1 po q wk, 7 months total, Ciclopirox qohs).   The following portions of the chart were reviewed this encounter and updated as appropriate:      Review of Systems:  No other skin or systemic complaints except as noted in HPI or Assessment and Plan.  Objective  Well appearing patient in no apparent distress; mood and affect are within normal limits.  A focused examination was performed including fingenails. Relevant physical exam findings are noted in the Assessment and Plan.  Objective  Fingernails, thumbnails: Distal 25% hyperpigmentation with thickening R thumbnail, other nails clear.  Improved when compared to baseline photo   Assessment & Plan  Onychomycosis Fingernails, thumbnails  Culture proven Candida Parapsilosis  Much Improved with 101m of weekly Fluconazole treatment, D/C at this time  Cont Ciclopirox solution qhs to R thumb nail until nail completely grown out.  Other Related Medications fluconazole (DIFLUCAN) 200 MG tablet  Return if symptoms worsen or fail to improve.   I, Othelia Pulling, RMA, am acting as scribe for Brendolyn Patty, MD . Documentation: I have reviewed the above documentation for accuracy and completeness, and I agree with the above.  Brendolyn Patty MD

## 2020-05-30 ENCOUNTER — Telehealth: Payer: Self-pay

## 2020-05-30 NOTE — Chronic Care Management (AMB) (Signed)
  Care Management   Note  05/30/2020 Name: Tina Mcdonald MRN: [id number] DOB: 31-Aug-1940  Tina Mcdonald is a 79 y.o. year old female who is a primary care patient of [name], NP and is actively engaged with the care management team. I reached out to Tina Mcdonald by phone today to assist with re-scheduling a follow up visit with the RN Case Manager  Follow up plan: Unsuccessful telephone outreach attempt made. A HIPAA compliant phone message was left for the patient providing contact information and requesting a return call.  The care management team will reach out to the patient again over the next  5 days.  If patient returns call to provider office, please advise to call [facility], [place], [place] [address] Direct Dial: [phone] [email or web address] Website: [email or web address]

## 2020-06-06 ENCOUNTER — Inpatient Hospital Stay: Payer: Medicare Other | Attending: Oncology

## 2020-06-06 DIAGNOSIS — D563 Thalassemia minor: Secondary | ICD-10-CM | POA: Insufficient documentation

## 2020-06-06 DIAGNOSIS — N183 Chronic kidney disease, stage 3 unspecified: Secondary | ICD-10-CM | POA: Diagnosis not present

## 2020-06-06 DIAGNOSIS — D631 Anemia in chronic kidney disease: Secondary | ICD-10-CM | POA: Insufficient documentation

## 2020-06-06 DIAGNOSIS — D649 Anemia, unspecified: Secondary | ICD-10-CM

## 2020-06-06 LAB — COMPREHENSIVE METABOLIC PANEL
ALT: 18 U/L (ref 0–44)
AST: 21 U/L (ref 15–41)
Albumin: 4 g/dL (ref 3.5–5.0)
Alkaline Phosphatase: 65 U/L (ref 38–126)
Anion gap: 11 (ref 5–15)
BUN: 17 mg/dL (ref 8–23)
CO2: 29 mmol/L (ref 22–32)
Calcium: 9.2 mg/dL (ref 8.9–10.3)
Chloride: 103 mmol/L (ref 98–111)
Creatinine, Ser: 0.99 mg/dL (ref 0.44–1.00)
GFR, Estimated: 59 mL/min — ABNORMAL LOW (ref >60)
Glucose, Bld: 110 mg/dL — ABNORMAL HIGH (ref 70–99)
Potassium: 3.7 mmol/L (ref 3.5–5.1)
Sodium: 143 mmol/L (ref 135–145)
Total Bilirubin: 0.7 mg/dL (ref 0.3–1.2)
Total Protein: 7.2 g/dL (ref 6.5–8.1)

## 2020-06-06 LAB — CBC WITH DIFFERENTIAL/PLATELET
Abs Immature Granulocytes: 0.01 10*3/uL (ref 0.00–0.07)
Basophils Absolute: 0 10*3/uL (ref 0.0–0.1)
Basophils Relative: 0 %
Eosinophils Absolute: 0.2 10*3/uL (ref 0.0–0.5)
Eosinophils Relative: 3 %
HCT: 36.6 % (ref 36.0–46.0)
Hemoglobin: 11.2 g/dL — ABNORMAL LOW (ref 12.0–15.0)
Immature Granulocytes: 0 %
Lymphocytes Relative: 34 %
Lymphs Abs: 2.8 10*3/uL (ref 0.7–4.0)
MCH: 24.6 pg — ABNORMAL LOW (ref 26.0–34.0)
MCHC: 30.6 g/dL (ref 30.0–36.0)
MCV: 80.3 fL (ref 80.0–100.0)
Monocytes Absolute: 0.8 10*3/uL (ref 0.1–1.0)
Monocytes Relative: 9 %
Neutro Abs: 4.6 10*3/uL (ref 1.7–7.7)
Neutrophils Relative %: 54 %
Platelets: 247 10*3/uL (ref 150–400)
RBC: 4.56 MIL/uL (ref 3.87–5.11)
RDW: 14.7 % (ref 11.5–15.5)
WBC: 8.4 10*3/uL (ref 4.0–10.5)
nRBC: 0 % (ref 0.0–0.2)

## 2020-06-06 LAB — IRON AND TIBC
Iron: 40 ug/dL (ref 28–170)
Saturation Ratios: 14 % (ref 10.4–31.8)
TIBC: 295 ug/dL (ref 250–450)
UIBC: 255 ug/dL

## 2020-06-06 LAB — FERRITIN: Ferritin: 80 ng/mL (ref 11–307)

## 2020-06-08 ENCOUNTER — Inpatient Hospital Stay: Payer: Medicare Other | Admitting: Oncology

## 2020-06-08 ENCOUNTER — Encounter: Payer: Self-pay | Admitting: Oncology

## 2020-06-08 ENCOUNTER — Other Ambulatory Visit: Payer: Self-pay

## 2020-06-08 VITALS — BP 150/68 | HR 79 | Temp 98.6°F | Resp 16 | Wt 162.3 lb

## 2020-06-08 DIAGNOSIS — D563 Thalassemia minor: Secondary | ICD-10-CM | POA: Diagnosis not present

## 2020-06-08 DIAGNOSIS — D649 Anemia, unspecified: Secondary | ICD-10-CM

## 2020-06-08 DIAGNOSIS — D631 Anemia in chronic kidney disease: Secondary | ICD-10-CM

## 2020-06-08 DIAGNOSIS — D508 Other iron deficiency anemias: Secondary | ICD-10-CM

## 2020-06-08 DIAGNOSIS — N183 Chronic kidney disease, stage 3 unspecified: Secondary | ICD-10-CM | POA: Diagnosis not present

## 2020-06-08 NOTE — Progress Notes (Signed)
Hematology/Oncology Consult note Tina Mcdonald Telephone:([phone] [phone]   Patient Care Team: [name], NP as PCP - General (Nurse Practitioner) Yolonda Kida, MD (Internal Medicine) Vanita Ingles, RN as Case Manager (General Practice)  REFERRING PROVIDER: [name], NP  CHIEF COMPLAINTS/REASON FOR VISIT:  Evaluation of microcytic anemia  HISTORY OF PRESENTING ILLNESS:  Tina Mcdonald is a  79 y.o.  female with PMH listed below who was seen at the request of [name], NP for evaluation of microcytic anemia.   Reviewed patient's recent labs 829 labs revealed anemia with hemoglobin of 10.8, MCV 75 WBC and platelet count. Reviewed patient's previous labs ordered by primary care physician's office, anemia is chronic onset , duration is since at least 2016.  Baseline mid tens. Chronically microcytic MCV in the mid 70s. No aggravating or improving factors.  Associated signs and symptoms: Patient reports fatigue.  SOB with exertion.  Denies weight loss, easy bruising, hematochezia, hemoptysis, hematuria. Context: History of GI bleeding: Denies               History of Chronic kidney disease; patient has CKD stage III               History of autoimmune disease denies               History of hemolytic anemia.  Denies               Last colonoscopy: 2017 colonoscopy showed sigmoid colon polyp-resected and retrieved.  Pathology showed hyperplastic polyp.               History of heavy menstrual period.  History of hysterectomy.   INTERVAL HISTORY Tina HEMING is a 79 y.o. female who has above history reviewed by me today presents for follow up visit for management of anemia Problems and complaints are listed below:  Patient reports feeling well at baseline.  She takes oral iron supplementation once daily.  No new complaints.   Review of Systems  Constitutional: Negative for appetite change, chills, fatigue and  fever.  HENT:   Negative for hearing loss and voice change.   Eyes: Negative for eye problems.  Respiratory: Negative for chest tightness and cough.   Cardiovascular: Negative for chest pain.  Gastrointestinal: Negative for abdominal distention, abdominal pain and blood in stool.  Endocrine: Negative for hot flashes.  Genitourinary: Negative for difficulty urinating and frequency.   Musculoskeletal: Negative for arthralgias.  Skin: Negative for itching and rash.  Neurological: Negative for extremity weakness.  Hematological: Negative for adenopathy.  Psychiatric/Behavioral: Negative for confusion.     MEDICAL HISTORY:  Past Medical History:  Diagnosis Date  . Anemia   . Arthritis   . Chronic kidney disease   . Colon polyps   . Diabetes mellitus without complication ([place])   . Diabetic neuropathy ([place])   . Dyspnea   . GERD (gastroesophageal reflux disease)   . Hyperlipemia   . Hypertension   . Osteoarthritis   . Radiculopathy    weakness in right leg  . Sleep apnea    does not have her cpap anymore  . Stress incontinence   . Urticaria   . Vitamin B 12 deficiency   . Wears glasses     SURGICAL HISTORY: Past Surgical History:  Procedure Laterality Date  . ABDOMINAL HYSTERECTOMY    . APPENDECTOMY    . BREAST BIOPSY Right    right-neg  . BREAST BIOPSY  Left    us/bx/clip-neg  . CARPAL TUNNEL RELEASE     right  . COLONOSCOPY    . COLONOSCOPY WITH PROPOFOL N/A 12/10/2015   Procedure: COLONOSCOPY WITH PROPOFOL;  Surgeon: Lucilla Lame, MD;  Location: [place];  Service: Endoscopy;  Laterality: N/A;  Diabetic - oral meds Sleep Apnea LATEX allergy  . COLONOSCOPY WITH PROPOFOL N/A 01/17/2019   Procedure: COLONOSCOPY WITH PROPOFOL;  Surgeon: Virgel Manifold, MD;  Location: [place];  Service: Endoscopy;  Laterality: N/A;  . CYSTOSCOPY    . DILATION AND CURETTAGE OF UTERUS    . ESOPHAGOGASTRODUODENOSCOPY (EGD) WITH PROPOFOL N/A 01/17/2019    Procedure: ESOPHAGOGASTRODUODENOSCOPY (EGD) WITH PROPOFOL;  Surgeon: Virgel Manifold, MD;  Location: [place];  Service: Endoscopy;  Laterality: N/A;  . EYE SURGERY Bilateral    lens implant  . GIVENS CAPSULE STUDY N/A 05/04/2019   Procedure: GIVENS CAPSULE STUDY;  Surgeon: Virgel Manifold, MD;  Location: [facility];  Service: Endoscopy;  Laterality: N/A;  . LUMBAR FUSION  2009  . POLYPECTOMY  12/10/2015   Procedure: POLYPECTOMY;  Surgeon: Lucilla Lame, MD;  Location: [place];  Service: Endoscopy;;  . POLYPECTOMY  01/17/2019   Procedure: POLYPECTOMY;  Surgeon: Virgel Manifold, MD;  Location: [facility];  Service: Endoscopy;;  . RESECTION DISTAL CLAVICAL Right 11/25/2013   Procedure: RIGHT SHOULDER OPEN RESECTION DISTAL CLAVICAL EXCISION SOFT TISSUE TUMOR SHOULDER DEEP SUBFASCIAL INTRAMUSCULAR/DEBRIDEMENT/ROTATOR CUFF REPAIR/BICEPS TENODESIS/MANIPULATION;  Surgeon: Renette Butters, MD;  Location: [place];  Service: Orthopedics;  Laterality: Right;  . SHOULDER ARTHROSCOPY WITH ROTATOR CUFF REPAIR AND SUBACROMIAL DECOMPRESSION Right 11/25/2013   Procedure: SHOULDER ARTHROSCOPY WITH DEBRIDEMENT ROTATOR CUFF REPAIR  A;  Surgeon: Renette Butters, MD;  Location: [place];  Service: Orthopedics;  Laterality: Right;    SOCIAL HISTORY: Social History   Socioeconomic History  . Marital status: Single    Spouse name: Not on file  . Number of children: Not on file  . Years of education: Not on file  . Highest education level: Some college, no degree  Occupational History  . Occupation: retired  Tobacco Use  . Smoking status: Former Smoker    Packs/day: 1.00    Years: 15.00    Pack years: 15.00    Quit date: 11/22/1997    Years since quitting: 22.5  . Smokeless tobacco: Never Used  Vaping Use  . Vaping Use: Never used  Substance and Sexual Activity  . Alcohol use: No  . Drug use: No  . Sexual activity: Not  Currently  Other Topics Concern  . Not on file  Social History Narrative  . Not on file   Social Determinants of Health   Financial Resource Strain: Low Risk   . Difficulty of Paying Living Expenses: Not hard at all  Food Insecurity: No Food Insecurity  . Worried About Charity fundraiser in the Last Year: Never true  . Ran Out of Food in the Last Year: Never true  Transportation Needs: No Transportation Needs  . Lack of Transportation (Medical): No  . Lack of Transportation (Non-Medical): No  Physical Activity: Inactive  . Days of Exercise per Week: 0 days  . Minutes of Exercise per Session: 0 min  Stress: No Stress Concern Present  . Feeling of Stress : Not at all  Social Connections: Unknown  . Frequency of Communication with Friends and Family: More than three times a week  . Frequency of Social  Gatherings with Friends and Family: More than three times a week  . Attends Religious Services: More than 4 times per year  . Active Member of Clubs or Organizations: Yes  . Attends Archivist Meetings: More than 4 times per year  . Marital Status: Not on file  Intimate Partner Violence: Not At Risk  . Fear of Current or Ex-Partner: No  . Emotionally Abused: No  . Physically Abused: No  . Sexually Abused: No    FAMILY HISTORY: Family History  Problem Relation Age of Onset  . Heart disease Mother   . Diabetes Mother   . Hypertension Mother   . Heart disease Father   . Hypertension Father   . Diabetes Sister   . Hypertension Sister   . Heart disease Sister   . Kidney disease Sister   . Diabetes Sister   . Hypertension Sister   . Breast cancer Cousin 48  . Non-Hodgkin's lymphoma Maternal Uncle     ALLERGIES:  is allergic to contrast media [iodinated diagnostic agents], penicillins, sulfa antibiotics, latex, neosporin [neomycin-bacitracin zn-polymyx], and other.  MEDICATIONS:  Current Outpatient Medications  Medication Sig Dispense Refill  . acetaminophen  (TYLENOL) 325 MG tablet Take 650 mg by mouth every 6 (six) hours as needed.    [name] atorvastatin (LIPITOR) 80 MG tablet Take 1 tablet (80 mg total) by mouth daily. 90 tablet 4  . blood glucose meter kit and supplies KIT Dispense based on patient and insurance preference. Use up to four times daily as directed. (FOR ICD-9 250.00, 250.01). 1 each 0  . ciclopirox (PENLAC) 8 % solution Apply topically at bedtime. Apply over nail and surrounding skin. Apply daily over previous coat. After seven (7) days, may remove with alcohol and continue cycle. 6.6 mL 1  . cloNIDine (CATAPRES) 0.1 MG tablet Take 0.1 mg by mouth 2 (two) times daily.    . diclofenac sodium (VOLTAREN) 1 % GEL Apply 2 g topically 2 (two) times daily as needed. 1 Tube 2  . Ferrous Sulfate (SLOW FE PO) Take 1 tablet by mouth daily.    [name] gabapentin (NEURONTIN) 100 MG capsule Take 3 capsules (300 mg total) by mouth at bedtime. (Patient taking differently: Take 200 mg by mouth at bedtime.) 270 capsule 4  . glucose blood test strip Check daily. ICD 10 E11.9 100 each 12  . hydrochlorothiazide (HYDRODIURIL) 25 MG tablet Take 1 tablet (25 mg total) by mouth daily. 90 tablet 4  . metFORMIN (GLUCOPHAGE-XR) 500 MG 24 hr tablet Take 1 tablet (500 mg total) by mouth 2 (two) times daily. 180 tablet 4  . metoprolol succinate (TOPROL-XL) 50 MG 24 hr tablet Take 1 tablet (50 mg total) by mouth daily. Take with or immediately following a meal. 90 tablet 4  . omeprazole (PRILOSEC) 20 MG capsule Take 1 capsule (20 mg total) by mouth daily. 90 capsule 4  . vitamin C (ASCORBIC ACID) 250 MG tablet Take 2 tablets (500 mg total) by mouth daily. 60 tablet 2   Current Facility-Administered Medications  Medication Dose Route Frequency Provider Last Rate Last Admin  . cyanocobalamin ((VITAMIN B-12)) injection 1,000 mcg  1,000 mcg Intramuscular Q30 days Kathrine Haddock, NP   1,000 mcg at 05/22/20 1401     PHYSICAL EXAMINATION: ECOG PERFORMANCE STATUS: 1 - Symptomatic  but completely ambulatory Vitals:   06/08/20 1315  BP: (!) 150/68  Pulse: 79  Resp: 16  Temp: 98.6 F (37 C)  SpO2: 99%   Filed Weights  06/08/20 1315  Weight: 162 lb 4.8 oz (73.6 kg)    Physical Exam Constitutional:      General: She is not in acute distress. HENT:     Head: Normocephalic and atraumatic.  Eyes:     General: No scleral icterus.    Pupils: Pupils are equal, round, and reactive to light.  Cardiovascular:     Rate and Rhythm: Normal rate and regular rhythm.     Heart sounds: Normal heart sounds.  Pulmonary:     Effort: Pulmonary effort is normal. No respiratory distress.     Breath sounds: No wheezing.  Abdominal:     General: Bowel sounds are normal. There is no distension.     Palpations: Abdomen is soft. There is no mass.     Tenderness: There is no abdominal tenderness.  Musculoskeletal:        General: No deformity. Normal range of motion.     Cervical back: Normal range of motion and neck supple.  Skin:    General: Skin is warm and dry.     Findings: No erythema or rash.  Neurological:     Mental Status: She is alert and oriented to person, place, and time.     Cranial Nerves: No cranial nerve deficit.     Coordination: Coordination normal.  Psychiatric:        Behavior: Behavior normal.        Thought Content: Thought content normal.      LABORATORY DATA:  I have reviewed the data as listed Lab Results  Component Value Date   WBC 8.4 06/06/2020   HGB 11.2 (L) 06/06/2020   HCT 36.6 06/06/2020   MCV 80.3 06/06/2020   PLT 247 06/06/2020   Recent Labs    12/09/19 1324 01/12/20 0928 04/13/20 1329 06/06/20 1421  NA 140 144 141 143  K 4.1 4.3 4.3 3.7  CL 101 107* 101 103  CO2 _0 GLUCOSE 98 93 118* 110*  BUN _1 CREATININE 0.84 0.93 0.97 0.99  CALCIUM 9.3 9.7 9.7 9.2  GFRNONAA >60 59* 57* 59*  GFRAA >60 69 65  --   PROT 7.3 6.4  --  7.2  ALBUMIN 3.9 4.1  --  4.0  AST 21 16  --  21  ALT 21 14  --  18   ALKPHOS 56 68  --  65  BILITOT 0.6 0.2  --  0.7   Iron/TIBC/Ferritin/ %Sat    Component Value Date/Time   IRON 40 06/06/2020 1421   IRON 62 12/02/2019 1110   TIBC 295 06/06/2020 1421   TIBC 253 12/02/2019 1110   FERRITIN 80 06/06/2020 1421   FERRITIN 134 12/02/2019 1110   IRONPCTSAT 14 06/06/2020 1421   IRONPCTSAT 25 12/02/2019 1110        ASSESSMENT & PLAN:  1. Normocytic anemia   2. Thalassemia alpha carrier   3. Anemia due to stage 3 chronic kidney disease, unspecified whether stage 3a or 3b CKD (HCC)   4. Other iron deficiency anemia    #Iron deficiency anemia, hemoglobin has improved to 11.2. Labs reviewed and discussed with patient.  Patient has stable iron panel with ferritin level 80, saturation 14, TIBC 295 .  Recommend patient to take Slow Fe 3 to 4 days/week.  Anemia due to chronic kidney disease, GFR 59.  Hemoglobin is above 10.  No need for intervention at this point.  Thalassemia carrier, no intervention at this point. [name]  Orders Placed This Encounter  Procedures  . CBC with Differential/Platelet    Standing Status:   Future    Standing Expiration Date:   06/08/2021  . Comprehensive metabolic panel    Standing Status:   Future    Standing Expiration Date:   06/08/2021  . Ferritin    Standing Status:   Future    Standing Expiration Date:   06/08/2021  . Iron and TIBC    Standing Status:   Future    Standing Expiration Date:   06/08/2021    All questions were answered. The patient knows to call the clinic with any problems questions or concerns.  Follow-up in 1 year Earlie Server, MD, PhD Hematology Oncology [facility] at [facility] Pager- [phone] 06/08/2020

## 2020-06-08 NOTE — Progress Notes (Signed)
Patient here for follow up. No new concerns voiced.  °

## 2020-06-13 NOTE — Chronic Care Management (AMB) (Signed)
  Care Management   Note  06/13/2020 Name: VERNE COVE MRN: [id number] DOB: 16-Feb-1941  [name] is a 79 y.o. year old female who is a primary care patient of [name], NP and is actively engaged with the care management team. I reached out to [name] by phone today to assist with re-scheduling a follow up visit with the RN Case Manager  Follow up plan: Telephone appointment with care management team member scheduled for:08/07/2020  Noreene Larsson, [place], [place] [facility]  [place], [place] [address] Direct Dial: [phone] [email or web address] Website: [email or web address]

## 2020-06-13 NOTE — Telephone Encounter (Signed)
Pt has been r/s  

## 2020-06-20 ENCOUNTER — Other Ambulatory Visit: Payer: Self-pay

## 2020-06-20 ENCOUNTER — Ambulatory Visit (INDEPENDENT_AMBULATORY_CARE_PROVIDER_SITE_OTHER): Payer: Medicare Other

## 2020-06-20 DIAGNOSIS — E538 Deficiency of other specified B group vitamins: Secondary | ICD-10-CM

## 2020-07-20 ENCOUNTER — Encounter: Payer: Medicare Other | Admitting: Nurse Practitioner

## 2020-08-07 ENCOUNTER — Telehealth: Payer: Medicare Other | Admitting: General Practice

## 2020-08-07 ENCOUNTER — Ambulatory Visit (INDEPENDENT_AMBULATORY_CARE_PROVIDER_SITE_OTHER): Payer: Medicare Other | Admitting: General Practice

## 2020-08-07 DIAGNOSIS — E1159 Type 2 diabetes mellitus with other circulatory complications: Secondary | ICD-10-CM

## 2020-08-07 DIAGNOSIS — E1169 Type 2 diabetes mellitus with other specified complication: Secondary | ICD-10-CM | POA: Diagnosis not present

## 2020-08-07 DIAGNOSIS — E785 Hyperlipidemia, unspecified: Secondary | ICD-10-CM | POA: Diagnosis not present

## 2020-08-07 DIAGNOSIS — I152 Hypertension secondary to endocrine disorders: Secondary | ICD-10-CM

## 2020-08-07 NOTE — Chronic Care Management (AMB) (Signed)
Chronic Care Management   CCM RN Visit Note  08/07/2020 Name: Tina Mcdonald MRN: [id number] DOB: 1941-04-11  Subjective: Tina Mcdonald is a 80 y.o. year old female who is a primary care patient of [name], [name], NP. The care management team was consulted for assistance with disease management and care coordination needs.    Engaged with patient by telephone for follow up visit in response to provider referral for case management and/or care coordination services.   Consent to Services:  The patient was given information about Chronic Care Management services, agreed to services, and gave verbal consent prior to initiation of services.  Please see initial visit note for detailed documentation.   Patient agreed to services and verbal consent obtained.   Assessment: Review of patient past medical history, allergies, medications, health status, including review of consultants reports, laboratory and other Mcdonald data, was performed as part of comprehensive evaluation and provision of chronic care management services.   SDOH (Social Determinants of Health) assessments and interventions performed:    CCM Care Plan  Allergies  Allergen Reactions   Contrast Media [Iodinated Diagnostic Agents] Itching   Penicillins Itching    Has patient had a PCN reaction causing immediate rash, facial/tongue/throat swelling, SOB or lightheadedness with hypotension: Yes Has patient had a PCN reaction causing severe rash involving mucus membranes or skin necrosis: Unknown Has patient had a PCN reaction that required hospitalization: Yes Has patient had a PCN reaction occurring within the last 10 years: Yes If all of the above answers are "NO", then may proceed with Cephalosporin use.   Sulfa Antibiotics Itching   Latex Rash   Neosporin [Neomycin-Bacitracin Zn-Polymyx] Itching and Rash   Other Rash    Pt has reaction to high acidity foods.     Outpatient Encounter Medications as of 08/07/2020   Medication Sig Note   acetaminophen (TYLENOL) 325 MG tablet Take 650 mg by mouth every 6 (six) hours as needed.    atorvastatin (LIPITOR) 80 MG tablet Take 1 tablet (80 mg total) by mouth daily. 01/11/2020: Holding on day that she takes fluconazole   blood glucose meter kit and supplies KIT Dispense based on patient and insurance preference. Use up to four times daily as directed. (FOR ICD-9 250.00, 250.01).    ciclopirox (PENLAC) 8 % solution Apply topically at bedtime. Apply over nail and surrounding skin. Apply daily over previous coat. After seven (7) days, may remove with alcohol and continue cycle.    cloNIDine (CATAPRES) 0.1 MG tablet Take 0.1 mg by mouth 2 (two) times daily.    diclofenac sodium (VOLTAREN) 1 % GEL Apply 2 g topically 2 (two) times daily as needed.    Ferrous Sulfate (SLOW FE PO) Take 1 tablet by mouth daily.    gabapentin (NEURONTIN) 100 MG capsule Take 3 capsules (300 mg total) by mouth at bedtime. (Patient taking differently: Take 200 mg by mouth at bedtime.)    glucose blood Mcdonald strip Check daily. ICD 10 E11.9    hydrochlorothiazide (HYDRODIURIL) 25 MG tablet Take 1 tablet (25 mg total) by mouth daily.    metFORMIN (GLUCOPHAGE-XR) 500 MG 24 hr tablet Take 1 tablet (500 mg total) by mouth 2 (two) times daily.    metoprolol succinate (TOPROL-XL) 50 MG 24 hr tablet Take 1 tablet (50 mg total) by mouth daily. Take with or immediately following a meal.    omeprazole (PRILOSEC) 20 MG capsule Take 1 capsule (20 mg total) by mouth daily.    vitamin  C (ASCORBIC ACID) 250 MG tablet Take 2 tablets (500 mg total) by mouth daily.    Facility-Administered Encounter Medications as of 08/07/2020  Medication   cyanocobalamin ((VITAMIN B-12)) injection 1,000 mcg    Patient Active Problem List   Diagnosis Date Noted   Aortic atherosclerosis ([place]) 02/02/2020   Nail fungus 04/12/2019   Diverticula, colon    Hiatal hernia    Sleep apnea 02/24/2017   Urge  incontinence 11/24/2016   Benign neoplasm of sigmoid colon    CKD stage 3 due to type 2 diabetes mellitus ([place]) 02/20/2015   Polyp of sigmoid colon 01/16/2015   Iron deficiency anemia 01/16/2015   B12 deficiency 01/16/2015   Radiculopathy 01/16/2015   Osteoarthritis 01/16/2015   Hypertension associated with diabetes ([place]) 01/16/2015   Hyperlipidemia associated with type 2 diabetes mellitus ([place]) 01/16/2015   Diabetic neuropathy ([place]) 01/16/2015   GERD (gastroesophageal reflux disease) 01/16/2015    Conditions to be addressed/monitored:HTN and HLD  Care Plan : RNCM: HLD  Updates made by Vanita Ingles since 08/07/2020 12:00 AM    Problem: RNCM: HLD   Priority: Medium    Long-Range Goal: RNCM: HLD   Priority: Medium  Note:   Current Barriers:   Poorly controlled hyperlipidemia, complicated by HTN  Current antihyperlipidemic regimen: atorvastatin 80 mg  Most recent lipid panel:     Component Value Date/Time   CHOL 183 04/13/2020 1329   CHOL 223 (H) 09/01/2019 1526   TRIG 125 04/13/2020 1329   TRIG 161 (H) 09/01/2019 1526   HDL 46 04/13/2020 1329   VLDL 32 (H) 09/01/2019 1526   LDLCALC 115 (H) 04/13/2020 1329     ASCVD risk enhancing conditions: age >84, DM, HTN, CKD, former smoker  Unable to independently manage HLD  Unable to self administer medications as prescribed  Lacks social connections  Does not contact provider office for questions/concerns  RN Care Manager Clinical Goal(s):   Over the next 120 days, patient will work with Consulting civil engineer, providers, and care team towards execution of optimized self-health management plan  Over the next 120 days, patient will verbalize understanding of plan for effective management of HLD   Over the next 120 days, patient will work with [facility] and pcp to address needs related to HLD   Over the next 120 days, patient will attend all scheduled medical appointments: 08-24-2020, but needs to be soon. The patient  needs to be seen due to her ear being stopped up.   Interventions:  Collaboration with [name], NP regarding development and update of comprehensive plan of care as evidenced by provider attestation and co-signature  Inter-disciplinary care team collaboration (see longitudinal plan of care)  Medication review performed; medication list updated in electronic medical record.   Inter-disciplinary care team collaboration (see longitudinal plan of care)  Referred to pharmacy team for assistance with HLD medication management  Evaluation of current treatment plan related to HLD and patient's adherence to plan as established by provider.  Advised patient to call the office for changes in condition or questions  Provided education to patient re: heart healthy diet, medication compliance, and pacing activity   Reviewed scheduled/upcoming provider appointments including: Has an appointment on 08-24-2020 but feels she needs to be seen sooner due to her ears being stopped up. Will call the office for new appointment   Discussed plans with patient for ongoing care management follow up and provided patient with direct contact information for care management team  Patient Goals/Self-Care  Activities:  Over the next 120 days, patient will:   - call for medicine refill 2 or 3 days before it runs out - call if I am sick and can't take my medicine - keep a list of all the medicines I take; vitamins and herbals too - learn to read medicine labels - use a pillbox to sort medicine - use an alarm clock or phone to remind me to take my medicine - change to whole grain breads, cereal, pasta - drink 6 to 8 glasses of water each day - eat 3 to 5 servings of fruits and vegetables each day - eat 5 or 6 small meals each day - fill half the plate with nonstarchy vegetables - limit fast food meals to no more than 1 per week - manage portion size - prepare main meal at home 3 to 5 days each week -  read food labels for fat, fiber, carbohydrates and portion size - be open to making changes - I can manage, know and watch for signs of a heart attack - if I have chest pain, call for help - learn about small changes that will make a big difference - learn my personal risk factors - education plan reviewed and/or amended - empathy and reassurance conveyed - health literacy screen reviewed - patient's preferred learning methods utilized - privacy ensured - questions encouraged - readiness to learn monitored  Follow Up Plan: Telephone follow up appointment with care management team member scheduled for: 09-19-2020 at 0830 am     Task: RNCM: HLD Management   Note:   Care Management Activities:    - education plan reviewed and/or amended - empathy and reassurance conveyed - health literacy screen reviewed - patient's preferred learning methods utilized - privacy ensured - questions encouraged - readiness to learn monitored       Care Plan : RNCM: Hypertension (Adult)  Updates made by Vanita Ingles since 08/07/2020 12:00 AM    Problem: RNCM: Hypertension (Hypertension)   Priority: Medium    Long-Range Goal: RNCM: Hypertension Monitored   Priority: Medium  Note:   Objective:   Last practice recorded BP readings:  BP Readings from Last 3 Encounters:  06/08/20 (!) 150/68  04/13/20 128/72  02/07/20 119/67     Most recent eGFR/CrCl: No results found for: EGFR  No components found for: CRCL Current Barriers:   Knowledge Deficits related to basic understanding of hypertension pathophysiology and self care management  Knowledge Deficits related to understanding of medications prescribed for management of hypertension  Limited Social Support  Does not attend all scheduled provider appointments  Lacks social connections  Does not contact provider office for questions/concerns Case Manager Clinical Goal(s):   Over the next 120 days, patient will verbalize  understanding of plan for hypertension management  Over the next 120 days, patient will attend all scheduled medical appointments: 08-24-2020  Over the next 120 days, patient will demonstrate improved adherence to prescribed treatment plan for hypertension as evidenced by taking all medications as prescribed, monitoring and recording blood pressure as directed, adhering to low sodium/DASH diet  Over the next 120 days, patient will demonstrate improved health management independence as evidenced by checking blood pressure as directed and notifying PCP if SBP>160 or DBP > 90, taking all medications as prescribe, and adhering to a low sodium diet as discussed.  Over the next 120 days, patient will verbalize basic understanding of hypertension disease process and self health management plan as evidenced by compliance with  medications, heart healthy diet, and working with CCM team to manage health and well being Interventions:   Collaboration with [name], NP regarding development and update of comprehensive plan of care as evidenced by provider attestation and co-signature  Inter-disciplinary care team collaboration (see longitudinal plan of care)  Evaluation of current treatment plan related to hypertension self management and patient's adherence to plan as established by provider.  Provided education to patient re: stroke prevention, s/s of heart attack and stroke, DASH diet, complications of uncontrolled blood pressure  Reviewed medications with patient and discussed importance of compliance  Discussed plans with patient for ongoing care management follow up and provided patient with direct contact information for care management team  Advised patient, providing education and rationale, to monitor blood pressure daily and record, calling PCP for findings outside established parameters.   Reviewed scheduled/upcoming provider appointments including: 08-24-2020 at 0900 Patient  Goals/Self-Care Activities  Over the next 120 days, patient will:  - Self administers medications as prescribed Attends all scheduled provider appointments Calls provider office for new concerns, questions, or BP outside discussed parameters Checks BP and records as discussed Follows a low sodium diet/DASH diet - depression screen reviewed - home or ambulatory blood pressure monitoring encouraged Follow Up Plan: Telephone follow up appointment with care management team member scheduled for: 09-19-2020   Task: RNCM: Identify and Monitor Blood Pressure Elevation   Note:   Care Management Activities:    - depression screen reviewed - home or ambulatory blood pressure monitoring encouraged         Plan:Telephone follow up appointment with care management team member scheduled for:  09-19-2020 at 0900 am   [place], MSN, [facility] Mobile: [phone]

## 2020-08-07 NOTE — Patient Instructions (Signed)
Visit Information  PATIENT GOALS: Patient Care Plan: RNCM: HLD    Problem Identified: RNCM: HLD   Priority: Medium    Long-Range Goal: RNCM: HLD   Priority: Medium  Note:   Current Barriers:  . Poorly controlled hyperlipidemia, complicated by HTN . Current antihyperlipidemic regimen: atorvastatin 80 mg . Most recent lipid panel:     Component Value Date/Time   CHOL 183 04/13/2020 1329   CHOL 223 (H) 09/01/2019 1526   TRIG 125 04/13/2020 1329   TRIG 161 (H) 09/01/2019 1526   HDL 46 04/13/2020 1329   VLDL 32 (H) 09/01/2019 1526   LDLCALC 115 (H) 04/13/2020 1329 .   [name] ASCVD risk enhancing conditions: age >83, DM, HTN, CKD, former smoker . Unable to independently manage HLD . Unable to self administer medications as prescribed . Lacks social connections . Does not contact provider office for questions/concerns  RN Care Manager Clinical Goal(s):  [name] Over the next 120 days, patient will work with Consulting civil engineer, providers, and care team towards execution of optimized self-health management plan . Over the next 120 days, patient will verbalize understanding of plan for effective management of HLD  . Over the next 120 days, patient will work with [facility] and pcp to address needs related to HLD  . Over the next 120 days, patient will attend all scheduled medical appointments: 08-24-2020, but needs to be soon. The patient needs to be seen due to her ear being stopped up.   Interventions: . Collaboration with [name], NP regarding development and update of comprehensive plan of care as evidenced by provider attestation and co-signature . Inter-disciplinary care team collaboration (see longitudinal plan of care) . Medication review performed; medication list updated in electronic medical record.  Bertram Savin care team collaboration (see longitudinal plan of care) . Referred to pharmacy team for assistance with HLD medication management . Evaluation of current treatment  plan related to HLD and patient's adherence to plan as established by provider. . Advised patient to call the office for changes in condition or questions . Provided education to patient re: heart healthy diet, medication compliance, and pacing activity  . Reviewed scheduled/upcoming provider appointments including: Has an appointment on 08-24-2020 but feels she needs to be seen sooner due to her ears being stopped up. Will call the office for new appointment  . Discussed plans with patient for ongoing care management follow up and provided patient with direct contact information for care management team  Patient Goals/Self-Care Activities: . Over the next 120 days, patient will:   - call for medicine refill 2 or 3 days before it runs out - call if I am sick and can't take my medicine - keep a list of all the medicines I take; vitamins and herbals too - learn to read medicine labels - use a pillbox to sort medicine - use an alarm clock or phone to remind me to take my medicine - change to whole grain breads, cereal, pasta - drink 6 to 8 glasses of water each day - eat 3 to 5 servings of fruits and vegetables each day - eat 5 or 6 small meals each day - fill half the plate with nonstarchy vegetables - limit fast food meals to no more than 1 per week - manage portion size - prepare main meal at home 3 to 5 days each week - read food labels for fat, fiber, carbohydrates and portion size - be open to making changes - I can manage,  know and watch for signs of a heart attack - if I have chest pain, call for help - learn about small changes that will make a big difference - learn my personal risk factors - education plan reviewed and/or amended - empathy and reassurance conveyed - health literacy screen reviewed - patient's preferred learning methods utilized - privacy ensured - questions encouraged - readiness to learn monitored  Follow Up Plan: Telephone follow up appointment with care  management team member scheduled for: 09-19-2020 at 0830 am     Task: RNCM: HLD Management   Note:   Care Management Activities:    - education plan reviewed and/or amended - empathy and reassurance conveyed - health literacy screen reviewed - patient's preferred learning methods utilized - privacy ensured - questions encouraged - readiness to learn monitored       Patient Care Plan: RNCM: Hypertension (Adult)    Problem Identified: RNCM: Hypertension (Hypertension)   Priority: Medium    Long-Range Goal: RNCM: Hypertension Monitored   Priority: Medium  Note:   Objective:  . Last practice recorded BP readings:  BP Readings from Last 3 Encounters:  06/08/20 (!) 150/68  04/13/20 128/72  02/07/20 119/67 .   [name] Most recent eGFR/CrCl: No results found for: EGFR  No components found for: CRCL Current Barriers:  [name] Knowledge Deficits related to basic understanding of hypertension pathophysiology and self care management . Knowledge Deficits related to understanding of medications prescribed for management of hypertension . Limited Social Support . Does not attend all scheduled provider appointments . Lacks social connections . Does not contact provider office for questions/concerns Case Manager Clinical Goal(s):  [name] Over the next 120 days, patient will verbalize understanding of plan for hypertension management . Over the next 120 days, patient will attend all scheduled medical appointments: 08-24-2020 . Over the next 120 days, patient will demonstrate improved adherence to prescribed treatment plan for hypertension as evidenced by taking all medications as prescribed, monitoring and recording blood pressure as directed, adhering to low sodium/DASH diet . Over the next 120 days, patient will demonstrate improved health management independence as evidenced by checking blood pressure as directed and notifying PCP if SBP>160 or DBP > 90, taking all medications as prescribe, and adhering to  a low sodium diet as discussed. . Over the next 120 days, patient will verbalize basic understanding of hypertension disease process and self health management plan as evidenced by compliance with medications, heart healthy diet, and working with CCM team to manage health and well being Interventions:  . Collaboration with [name], NP regarding development and update of comprehensive plan of care as evidenced by provider attestation and co-signature . Inter-disciplinary care team collaboration (see longitudinal plan of care) . Evaluation of current treatment plan related to hypertension self management and patient's adherence to plan as established by provider. . Provided education to patient re: stroke prevention, s/s of heart attack and stroke, DASH diet, complications of uncontrolled blood pressure . Reviewed medications with patient and discussed importance of compliance . Discussed plans with patient for ongoing care management follow up and provided patient with direct contact information for care management team . Advised patient, providing education and rationale, to monitor blood pressure daily and record, calling PCP for findings outside established parameters.  . Reviewed scheduled/upcoming provider appointments including: 08-24-2020 at 0900 Patient Goals/Self-Care Activities . Over the next 120 days, patient will:  - Self administers medications as prescribed Attends all scheduled provider appointments Calls provider office for new  concerns, questions, or BP outside discussed parameters Checks BP and records as discussed Follows a low sodium diet/DASH diet - depression screen reviewed - home or ambulatory blood pressure monitoring encouraged Follow Up Plan: Telephone follow up appointment with care management team member scheduled for: 09-19-2020   Task: RNCM: Identify and Monitor Blood Pressure Elevation   Note:   Care Management Activities:    - depression screen  reviewed - home or ambulatory blood pressure monitoring encouraged         The patient verbalized understanding of instructions, educational materials, and care plan provided today and declined offer to receive copy of patient instructions, educational materials, and care plan.   Telephone follow up appointment with care management team member scheduled for: 09-19-2020 at 0900 am  Noreene Larsson RN, MSN, [facility] Mobile: [phone]

## 2020-08-17 ENCOUNTER — Ambulatory Visit (INDEPENDENT_AMBULATORY_CARE_PROVIDER_SITE_OTHER): Payer: Medicare Other

## 2020-08-17 ENCOUNTER — Other Ambulatory Visit: Payer: Self-pay

## 2020-08-17 DIAGNOSIS — E538 Deficiency of other specified B group vitamins: Secondary | ICD-10-CM | POA: Diagnosis not present

## 2020-08-24 ENCOUNTER — Other Ambulatory Visit: Payer: Self-pay

## 2020-08-24 ENCOUNTER — Encounter: Payer: Self-pay | Admitting: Nurse Practitioner

## 2020-08-24 ENCOUNTER — Ambulatory Visit (INDEPENDENT_AMBULATORY_CARE_PROVIDER_SITE_OTHER): Payer: Medicare Other | Admitting: Nurse Practitioner

## 2020-08-24 VITALS — BP 134/65 | HR 80 | Temp 98.9°F | Ht 60.5 in | Wt 155.2 lb

## 2020-08-24 DIAGNOSIS — E1159 Type 2 diabetes mellitus with other circulatory complications: Secondary | ICD-10-CM | POA: Diagnosis not present

## 2020-08-24 DIAGNOSIS — K219 Gastro-esophageal reflux disease without esophagitis: Secondary | ICD-10-CM

## 2020-08-24 DIAGNOSIS — Z0000 Encounter for general adult medical examination without abnormal findings: Secondary | ICD-10-CM | POA: Diagnosis not present

## 2020-08-24 DIAGNOSIS — I700 Atherosclerosis of aorta: Secondary | ICD-10-CM | POA: Diagnosis not present

## 2020-08-24 DIAGNOSIS — Z23 Encounter for immunization: Secondary | ICD-10-CM

## 2020-08-24 DIAGNOSIS — I152 Hypertension secondary to endocrine disorders: Secondary | ICD-10-CM | POA: Diagnosis not present

## 2020-08-24 DIAGNOSIS — N183 Chronic kidney disease, stage 3 unspecified: Secondary | ICD-10-CM | POA: Diagnosis not present

## 2020-08-24 DIAGNOSIS — S60410A Abrasion of right index finger, initial encounter: Secondary | ICD-10-CM

## 2020-08-24 DIAGNOSIS — E1169 Type 2 diabetes mellitus with other specified complication: Secondary | ICD-10-CM

## 2020-08-24 DIAGNOSIS — N3941 Urge incontinence: Secondary | ICD-10-CM

## 2020-08-24 DIAGNOSIS — D508 Other iron deficiency anemias: Secondary | ICD-10-CM | POA: Diagnosis not present

## 2020-08-24 DIAGNOSIS — E785 Hyperlipidemia, unspecified: Secondary | ICD-10-CM

## 2020-08-24 DIAGNOSIS — E1149 Type 2 diabetes mellitus with other diabetic neurological complication: Secondary | ICD-10-CM | POA: Diagnosis not present

## 2020-08-24 DIAGNOSIS — E1122 Type 2 diabetes mellitus with diabetic chronic kidney disease: Secondary | ICD-10-CM

## 2020-08-24 DIAGNOSIS — R3915 Urgency of urination: Secondary | ICD-10-CM

## 2020-08-24 DIAGNOSIS — E538 Deficiency of other specified B group vitamins: Secondary | ICD-10-CM | POA: Diagnosis not present

## 2020-08-24 DIAGNOSIS — G4733 Obstructive sleep apnea (adult) (pediatric): Secondary | ICD-10-CM

## 2020-08-24 LAB — BAYER DCA HB A1C WAIVED: HB A1C (BAYER DCA - WAIVED): 6.5 % (ref <7.0)

## 2020-08-24 LAB — MICROALBUMIN, URINE WAIVED
Creatinine, Urine Waived: 300 mg/dL (ref 10–300)
Microalb, Ur Waived: 30 mg/L — ABNORMAL HIGH (ref 0–19)
Microalb/Creat Ratio: <30 mg/g (ref <30)

## 2020-08-24 LAB — URINALYSIS, ROUTINE W REFLEX MICROSCOPIC
Bilirubin, UA: NEGATIVE
Glucose, UA: NEGATIVE
Ketones, UA: NEGATIVE
Leukocytes,UA: NEGATIVE
Nitrite, UA: NEGATIVE
Protein,UA: NEGATIVE
RBC, UA: NEGATIVE
Specific Gravity, UA: 1.025 (ref 1.005–1.030)
Urobilinogen, Ur: 1 mg/dL (ref 0.2–1.0)
pH, UA: 6 (ref 5.0–7.5)

## 2020-08-24 MED ORDER — MIRABEGRON ER 25 MG PO TB24: 25.0000 mg | ORAL_TABLET | Freq: Every day | ORAL | 0 refills | Status: DC

## 2020-08-24 MED ORDER — PREDNISONE 20 MG PO TABS: 40.0000 mg | ORAL_TABLET | Freq: Every day | ORAL | 0 refills | Status: AC

## 2020-08-24 NOTE — Assessment & Plan Note (Signed)
Chronic, ongoing noted on imaging CT 01/26/17.  Continue daily statin and ASA for prevention and monitor closely.

## 2020-08-24 NOTE — Assessment & Plan Note (Addendum)
Chronic, ongoing with today A1C 6.5% and last GFR 59 -- urine ALB 30 today.  Continue current medication regimen and renal dose Metformin in future if decline noted.  Recommend continue to check BS at home consistently and document.  Consider addition of ACE or ARB in future, will discuss with her next visit as unsure she has tried these before.  Return in 3 months.

## 2020-08-24 NOTE — Assessment & Plan Note (Signed)
Chronic, ongoing.  Continue current medication regimen and adjust as needed. Lipid panel today. 

## 2020-08-24 NOTE — Assessment & Plan Note (Signed)
Chronic, stable. Continue omeprazole. Educated on double swallow, chin tuck, bite size portions, and food diary with symptoms.  Mag level today.

## 2020-08-24 NOTE — Assessment & Plan Note (Signed)
Chronic, ongoing with worsening.  Has tried Kegel exercises for years, which offered benefit, but no longer helping.  Will trial Myrbetriq 25 MG daily, educated her on this medication and use.  This is preferred medication for patients >65 and safest for this age group, will avoid Ditropan.  Return in 3 months.

## 2020-08-24 NOTE — Assessment & Plan Note (Signed)
Sleep study performed, could not afford to return to clinic or obtain equipment.  Will continue to work with CCM team on this.

## 2020-08-24 NOTE — Assessment & Plan Note (Signed)
Chronic, ongoing.  Continue current medication regimen and collaboration with hematology.

## 2020-08-24 NOTE — Patient Instructions (Signed)
Diabetes Mellitus and Nutrition, Adult When you have diabetes, or diabetes mellitus, it is very important to have healthy eating habits because your blood sugar (glucose) levels are greatly affected by what you eat and drink. Eating healthy foods in the right amounts, at about the same times every day, can help you:  Control your blood glucose.  Lower your risk of heart disease.  Improve your blood pressure.  Reach or maintain a healthy weight. What can affect my meal plan? Every person with diabetes is different, and each person has different needs for a meal plan. Your health care provider may recommend that you work with a dietitian to make a meal plan that is best for you. Your meal plan may vary depending on factors such as:  The calories you need.  The medicines you take.  Your weight.  Your blood glucose, blood pressure, and cholesterol levels.  Your activity level.  Other health conditions you have, such as heart or kidney disease. How do carbohydrates affect me? Carbohydrates, also called carbs, affect your blood glucose level more than any other type of food. Eating carbs naturally raises the amount of glucose in your blood. Carb counting is a method for keeping track of how many carbs you eat. Counting carbs is important to keep your blood glucose at a healthy level, especially if you use insulin or take certain oral diabetes medicines. It is important to know how many carbs you can safely have in each meal. This is different for every person. Your dietitian can help you calculate how many carbs you should have at each meal and for each snack. How does alcohol affect me? Alcohol can cause a sudden decrease in blood glucose (hypoglycemia), especially if you use insulin or take certain oral diabetes medicines. Hypoglycemia can be a life-threatening condition. Symptoms of hypoglycemia, such as sleepiness, dizziness, and confusion, are similar to symptoms of having too much  alcohol.  Do not drink alcohol if: ? Your health care provider tells you not to drink. ? You are pregnant, may be pregnant, or are planning to become pregnant.  If you drink alcohol: ? Do not drink on an empty stomach. ? Limit how much you use to:  0-1 drink a day for women.  0-2 drinks a day for men. ? Be aware of how much alcohol is in your drink. In the [place] one drink equals one 12 oz bottle of beer (355 mL), one 5 oz glass of wine (148 mL), or one 1 oz glass of hard liquor (44 mL). ? Keep yourself hydrated with water, diet soda, or unsweetened iced tea.  Keep in mind that regular soda, juice, and other mixers may contain a lot of sugar and must be counted as carbs. What are tips for following this plan? Reading food labels  Start by checking the serving size on the "Nutrition Facts" label of packaged foods and drinks. The amount of calories, carbs, fats, and other nutrients listed on the label is based on one serving of the item. Many items contain more than one serving per package.  Check the total grams (g) of carbs in one serving. You can calculate the number of servings of carbs in one serving by dividing the total carbs by 15. For example, if a food has 30 g of total carbs per serving, it would be equal to 2 servings of carbs.  Check the number of grams (g) of saturated fats and trans fats in one serving. Choose foods that have   a low amount or none of these fats.  Check the number of milligrams (mg) of salt (sodium) in one serving. Most people should limit total sodium intake to less than 2,300 mg per day.  Always check the nutrition information of foods labeled as "low-fat" or "nonfat." These foods may be higher in added sugar or refined carbs and should be avoided.  Talk to your dietitian to identify your daily goals for nutrients listed on the label. Shopping  Avoid buying canned, pre-made, or processed foods. These foods tend to be high in fat, sodium, and added  sugar.  Shop around the outside edge of the grocery store. This is where you will most often find fresh fruits and vegetables, bulk grains, fresh meats, and fresh dairy. Cooking  Use low-heat cooking methods, such as baking, instead of high-heat cooking methods like deep frying.  Cook using healthy oils, such as olive, canola, or sunflower oil.  Avoid cooking with butter, cream, or high-fat meats. Meal planning  Eat meals and snacks regularly, preferably at the same times every day. Avoid going long periods of time without eating.  Eat foods that are high in fiber, such as fresh fruits, vegetables, beans, and whole grains. Talk with your dietitian about how many servings of carbs you can eat at each meal.  Eat 4-6 oz (112-168 g) of lean protein each day, such as lean meat, chicken, fish, eggs, or tofu. One ounce (oz) of lean protein is equal to: ? 1 oz (28 g) of meat, chicken, or fish. ? 1 egg. ?  cup (62 g) of tofu.  Eat some foods each day that contain healthy fats, such as avocado, nuts, seeds, and fish.   What foods should I eat? Fruits Berries. Apples. Oranges. Peaches. Apricots. Plums. Grapes. Mango. Papaya. Pomegranate. Kiwi. Cherries. Vegetables Lettuce. Spinach. Leafy greens, including kale, chard, collard greens, and mustard greens. Beets. Cauliflower. Cabbage. Broccoli. Carrots. Green beans. Tomatoes. Peppers. Onions. Cucumbers. Brussels sprouts. Grains Whole grains, such as whole-wheat or whole-grain bread, crackers, tortillas, cereal, and pasta. Unsweetened oatmeal. Quinoa. Brown or wild rice. Meats and other proteins Seafood. Poultry without skin. Lean cuts of poultry and beef. Tofu. Nuts. Seeds. Dairy Low-fat or fat-free dairy products such as milk, yogurt, and cheese. The items listed above may not be a complete list of foods and beverages you can eat. Contact a dietitian for more information. What foods should I avoid? Fruits Fruits canned with  syrup. Vegetables Canned vegetables. Frozen vegetables with butter or cream sauce. Grains Refined white flour and flour products such as bread, pasta, snack foods, and cereals. Avoid all processed foods. Meats and other proteins Fatty cuts of meat. Poultry with skin. Breaded or fried meats. Processed meat. Avoid saturated fats. Dairy Full-fat yogurt, cheese, or milk. Beverages Sweetened drinks, such as soda or iced tea. The items listed above may not be a complete list of foods and beverages you should avoid. Contact a dietitian for more information. Questions to ask a health care provider  Do I need to meet with a diabetes educator?  Do I need to meet with a dietitian?  What number can I call if I have questions?  When are the best times to check my blood glucose? Where to find more information:  American Diabetes Association: diabetes.org  Academy of Nutrition and Dietetics: www.eatright.org  National Institute of Diabetes and Digestive and Kidney Diseases: www.niddk.nih.gov  Association of Diabetes Care and Education Specialists: www.diabeteseducator.org Summary  It is important to have healthy eating   habits because your blood sugar (glucose) levels are greatly affected by what you eat and drink.  A healthy meal plan will help you control your blood glucose and maintain a healthy lifestyle.  Your health care provider may recommend that you work with a dietitian to make a meal plan that is best for you.  Keep in mind that carbohydrates (carbs) and alcohol have immediate effects on your blood glucose levels. It is important to count carbs and to use alcohol carefully. This information is not intended to replace advice given to you by your health care provider. Make sure you discuss any questions you have with your health care provider. Document Revised: 05/17/2019 Document Reviewed: 05/17/2019 [employer] Patient Education  2021 [employer]  

## 2020-08-24 NOTE — Progress Notes (Addendum)
BP 134/65 (BP Location: Left Arm, Cuff Size: Normal)   Pulse 80   Temp 98.9 F (37.2 C) (Oral)   Ht 5' 0.5" (1.537 m)   Wt 155 lb 3.2 oz (70.4 kg)   LMP  (LMP Unknown)   SpO2 98%   BMI 29.81 kg/m    Subjective:    Patient ID: Tina Mcdonald, female    DOB: 03-03-1941, 80 y.o.   MRN: [id number]  HPI: Tina Mcdonald is a 80 y.o. female presenting on 08/24/2020 for comprehensive medical examination. Current medical complaints include:none  She currently lives with: self Menopausal Symptoms: no   DIABETES Continues on Metformin and Gabapentin. Last A1C 6.3% in October.Continues on B12 and Slow Fe for anemia, last saw Dr. 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 Hypoglycemic episodes:no Polydipsia/polyuria:no Visual disturbance:no Chest pain:no Paresthesias:no Glucose Monitoring:yes Accucheck frequency:every other day Fasting glucose:110 to 125 range Post prandial: Evening: Before meals: Taking Insulin?:no Long acting insulin: Short acting insulin: Blood Pressure Monitoring:not checking Retinal Examination:Not Up To Date --Dr. Bell, [place] last 11/01/2019 Foot Exam:Up to Date Pneumovax:Up to Date Influenza:Not Up to Date Aspirin:no  HYPERTENSION / HYPERLIPIDEMIA Continues on HCTZ, Metoprolol XL, and Clonidine + Lipitor.Last saw Dr. Clayborn Bigness 03/22/2019. Has history of aortic atherosclerosis noted on past CT 01/26/17. Satisfied with current treatment?yes Duration of hypertension:chronic BP monitoring frequency:once a week BP range: 120/70 range BP medication side effects:no Duration of hyperlipidemia:chronic Cholesterol medication side  effects:no Cholesterol supplements: none Medication compliance:good compliance Aspirin:no Recent stressors:no Recurrent headaches:no Visual changes:no Palpitations:no Dyspnea:no Chest pain:no Lower extremity edema:no Dizzy/lightheaded:no  EAR ECHO For weeks like and echo to right ear at times. Duration: weeks Involved ear(s): right Fever: no Otorrhea: no Upper respiratory infection symptoms: no Pruritus: no Hearing loss: no Water immersion no Using Q-tips: yes Recurrent otitis media: no Status: stable Treatments attempted: none  URINARY URGENCY Started having urinary urgency worsening over past 4 months. Has been ongoing issue and at one time could do Kegel exercises and would benefit, but this does not help anymore. Dysuria: no Urinary frequency: occasional Urgency: yes Small volume voids: no Symptom severity: yes Urinary incontinence: occasional dribbling on pad Foul odor: no Hematuria: no Abdominal pain: no Back pain: no Suprapubic pain/pressure: no Flank pain: no Fever:  no Vomiting: no Relief with cranberry juice: not used Relief with pyridium: not used Status: better/worse/stable Previous urinary tract infection: yes Recurrent urinary tract infection: no  GERD Continues on Prilosec 20 MG daily. GERD control status: stable  Satisfied with current treatment? yes Heartburn frequency: none Medication side effects: no  Medication compliance: stable Previous GERD medications: Prilosec Dysphagia: no Odynophagia:  no Hematemesis: no Blood in stool: no EGD: no   Depression Screen done today and results listed below:  Depression screen [facility] 2/9 08/24/2020 03/26/2020 01/25/2020 08/23/2019 03/04/2018  Decreased Interest 0 0 0 0 0  Down, Depressed, Hopeless 0 0 0 0 0  PHQ - 2 Score 0 0 0 0 0  Altered sleeping 0 - - - -  Tired, decreased energy 0 - - - -  Change in appetite 0 - - - -  Feeling bad or failure about yourself  0 - - - -  Trouble  concentrating 0 - - - -  Moving slowly or fidgety/restless 0 - - - -  Suicidal thoughts 0 - - - -  PHQ-9 Score 0 - - - -  Some recent data might be hidden    The patient does not have a history of falls. I did not complete a risk assessment for falls. A plan of care for falls was not documented.   Past Medical History:  Past Medical History:  Diagnosis Date  . Anemia   . Arthritis   . Chronic kidney disease   . Colon polyps   . Diabetes mellitus without complication ([place])   . Diabetic neuropathy ([place])   . Dyspnea   . GERD (gastroesophageal reflux disease)   . Hyperlipemia   . Hypertension   . Osteoarthritis   . Radiculopathy    weakness in right leg  . Sleep apnea    does not have her cpap anymore  . Stress incontinence   . Urticaria   . Vitamin B 12 deficiency   . Wears glasses     Surgical History:  Past Surgical History:  Procedure Laterality Date  . ABDOMINAL HYSTERECTOMY    . APPENDECTOMY    . BREAST BIOPSY Right    right-neg  . BREAST BIOPSY Left    us/bx/clip-neg  . CARPAL TUNNEL RELEASE     right  . COLONOSCOPY    . COLONOSCOPY WITH PROPOFOL N/A 12/10/2015   Procedure: COLONOSCOPY WITH PROPOFOL;  Surgeon: Lucilla Lame, MD;  Location: [place];  Service: Endoscopy;  Laterality: N/A;  Diabetic - oral meds Sleep Apnea LATEX allergy  . COLONOSCOPY WITH PROPOFOL N/A 01/17/2019   Procedure: COLONOSCOPY WITH PROPOFOL;  Surgeon: Virgel Manifold, MD;  Location: [place];  Service: Endoscopy;  Laterality: N/A;  . CYSTOSCOPY    . DILATION AND CURETTAGE OF UTERUS    . ESOPHAGOGASTRODUODENOSCOPY (EGD) WITH PROPOFOL N/A 01/17/2019   Procedure: ESOPHAGOGASTRODUODENOSCOPY (EGD) WITH PROPOFOL;  Surgeon: Virgel Manifold, MD;  Location: [place];  Service: Endoscopy;  Laterality: N/A;  . EYE SURGERY Bilateral    lens implant  . GIVENS CAPSULE STUDY N/A 05/04/2019   Procedure: GIVENS CAPSULE STUDY;  Surgeon: Virgel Manifold,  MD;  Location: [facility];  Service: Endoscopy;  Laterality: N/A;  . LUMBAR FUSION  2009  . POLYPECTOMY  12/10/2015   Procedure: POLYPECTOMY;  Surgeon: Lucilla Lame, MD;  Location: [place];  Service: Endoscopy;;  . POLYPECTOMY  01/17/2019   Procedure: POLYPECTOMY;  Surgeon: Virgel Manifold, MD;  Location: [place];  Service: Endoscopy;;  . RESECTION DISTAL CLAVICAL Right 11/25/2013   Procedure: RIGHT SHOULDER OPEN RESECTION DISTAL CLAVICAL EXCISION SOFT TISSUE TUMOR SHOULDER DEEP SUBFASCIAL INTRAMUSCULAR/DEBRIDEMENT/ROTATOR CUFF REPAIR/BICEPS TENODESIS/MANIPULATION;  Surgeon: Renette Butters, MD;  Location: [place];  Service: Orthopedics;  Laterality: Right;  . SHOULDER ARTHROSCOPY WITH ROTATOR CUFF REPAIR AND SUBACROMIAL DECOMPRESSION Right 11/25/2013   Procedure: SHOULDER ARTHROSCOPY WITH DEBRIDEMENT ROTATOR CUFF REPAIR  A;  Surgeon: Renette Butters, MD;  Location: [place];  Service: Orthopedics;  Laterality: Right;    Medications:  Current Outpatient Medications on File Prior to Visit  Medication Sig  . acetaminophen (TYLENOL) 325 MG tablet Take 650 mg by mouth every 6 (six) hours as needed.  [name] atorvastatin (LIPITOR) 80 MG tablet Take 1 tablet (80 mg total) by mouth daily.  . blood glucose meter kit and supplies KIT Dispense based on patient and insurance preference. Use up to four times daily as directed. (FOR ICD-9 250.00, 250.01).  . ciclopirox (PENLAC) 8 % solution Apply  topically at bedtime. Apply over nail and surrounding skin. Apply daily over previous coat. After seven (7) days, may remove with alcohol and continue cycle.  . cloNIDine (CATAPRES) 0.1 MG tablet Take 0.1 mg by mouth 2 (two) times daily.  . Ferrous Sulfate (SLOW FE PO) Take 1 tablet by mouth daily.  [name] gabapentin (NEURONTIN) 100 MG capsule Take 3 capsules (300 mg total) by mouth at bedtime. (Patient taking differently: Take 200 mg by mouth at bedtime.)  .  glucose blood test strip Check daily. ICD 10 E11.9  . hydrochlorothiazide (HYDRODIURIL) 25 MG tablet Take 1 tablet (25 mg total) by mouth daily.  . metFORMIN (GLUCOPHAGE-XR) 500 MG 24 hr tablet Take 1 tablet (500 mg total) by mouth 2 (two) times daily.  . metoprolol succinate (TOPROL-XL) 50 MG 24 hr tablet Take 1 tablet (50 mg total) by mouth daily. Take with or immediately following a meal.  . omeprazole (PRILOSEC) 20 MG capsule Take 1 capsule (20 mg total) by mouth daily.  . vitamin C (ASCORBIC ACID) 250 MG tablet Take 2 tablets (500 mg total) by mouth daily.  . diclofenac sodium (VOLTAREN) 1 % GEL Apply 2 g topically 2 (two) times daily as needed. (Patient not taking: Reported on 08/24/2020)   Current Facility-Administered Medications on File Prior to Visit  Medication  . cyanocobalamin ((VITAMIN B-12)) injection 1,000 mcg    Allergies:  Allergies  Allergen Reactions  . Contrast Media [Iodinated Diagnostic Agents] Itching  . Penicillins Itching    Has patient had a PCN reaction causing immediate rash, facial/tongue/throat swelling, SOB or lightheadedness with hypotension: Yes Has patient had a PCN reaction causing severe rash involving mucus membranes or skin necrosis: Unknown Has patient had a PCN reaction that required hospitalization: Yes Has patient had a PCN reaction occurring within the last 10 years: Yes If all of the above answers are "NO", then may proceed with Cephalosporin use.  . Sulfa Antibiotics Itching  . Latex Rash  . Neosporin [Neomycin-Bacitracin Zn-Polymyx] Itching and Rash  . Other Rash    Pt has reaction to high acidity foods.     Social History:  Social History   Socioeconomic History  . Marital status: Single    Spouse name: Not on file  . Number of children: Not on file  . Years of education: Not on file  . Highest education level: Some college, no degree  Occupational History  . Occupation: retired  Tobacco Use  . Smoking status: Former Smoker     Packs/day: 1.00    Years: 15.00    Pack years: 15.00    Quit date: 11/22/1997    Years since quitting: 22.7  . Smokeless tobacco: Never Used  Vaping Use  . Vaping Use: Never used  Substance and Sexual Activity  . Alcohol use: No  . Drug use: No  . Sexual activity: Not Currently  Other Topics Concern  . Not on file  Social History Narrative  . Not on file   Social Determinants of Health   Financial Resource Strain: Low Risk   . Difficulty of Paying Living Expenses: Not hard at all  Food Insecurity: No Food Insecurity  . Worried About Charity fundraiser in the Last Year: Never true  . Ran Out of Food in the Last Year: Never true  Transportation Needs: No Transportation Needs  . Lack of Transportation (Medical): No  . Lack of Transportation (Non-Medical): No  Physical Activity: Inactive  . Days of Exercise per Week: 0 days  .  Minutes of Exercise per Session: 0 min  Stress: No Stress Concern Present  . Feeling of Stress : Not at all  Social Connections: Unknown  . Frequency of Communication with Friends and Family: More than three times a week  . Frequency of Social Gatherings with Friends and Family: More than three times a week  . Attends Religious Services: More than 4 times per year  . Active Member of Clubs or Organizations: Yes  . Attends Archivist Meetings: More than 4 times per year  . Marital Status: Not on file  Intimate Partner Violence: Not At Risk  . Fear of Current or Ex-Partner: No  . Emotionally Abused: No  . Physically Abused: No  . Sexually Abused: No   Social History   Tobacco Use  Smoking Status Former Smoker  . Packs/day: 1.00  . Years: 15.00  . Pack years: 15.00  . Quit date: 11/22/1997  . Years since quitting: 22.7  Smokeless Tobacco Never Used   Social History   Substance and Sexual Activity  Alcohol Use No    Family History:  Family History  Problem Relation Age of Onset  . Heart disease Mother   . Diabetes Mother   .  Hypertension Mother   . Heart disease Father   . Hypertension Father   . Diabetes Sister   . Hypertension Sister   . Heart disease Sister   . Kidney disease Sister   . Diabetes Sister   . Hypertension Sister   . Breast cancer Cousin 49  . Non-Hodgkin's lymphoma Maternal Uncle     Past medical history, surgical history, medications, allergies, family history and social history reviewed with patient today and changes made to appropriate areas of the chart.   ROS All other ROS negative except what is listed above and in the HPI.      Objective:    BP 134/65 (BP Location: Left Arm, Cuff Size: Normal)   Pulse 80   Temp 98.9 F (37.2 C) (Oral)   Ht 5' 0.5" (1.537 m)   Wt 155 lb 3.2 oz (70.4 kg)   LMP  (LMP Unknown)   SpO2 98%   BMI 29.81 kg/m   Wt Readings from Last 3 Encounters:  08/24/20 155 lb 3.2 oz (70.4 kg)  06/08/20 162 lb 4.8 oz (73.6 kg)  04/13/20 158 lb 3.2 oz (71.8 kg)    Physical Exam Vitals and nursing note reviewed.  Constitutional:      General: She is awake. She is not in acute distress.    Appearance: She is well-developed, well-groomed and overweight. She is not ill-appearing.  HENT:     Head: Normocephalic and atraumatic.     Right Ear: Hearing, ear canal and external ear normal. No drainage. A middle ear effusion is present.     Left Ear: Hearing, ear canal and external ear normal. No drainage. A middle ear effusion is present.     Nose: Nose normal.     Right Sinus: No maxillary sinus tenderness or frontal sinus tenderness.     Left Sinus: No maxillary sinus tenderness or frontal sinus tenderness.     Mouth/Throat:     Mouth: Mucous membranes are moist.     Pharynx: Oropharynx is clear. Uvula midline. No pharyngeal swelling, oropharyngeal exudate or posterior oropharyngeal erythema.  Eyes:     General: Lids are normal.        Right eye: No discharge.        Left eye: No discharge.  Extraocular Movements: Extraocular movements intact.      Conjunctiva/sclera: Conjunctivae normal.     Pupils: Pupils are equal, round, and reactive to light.     Visual Fields: Right eye visual fields normal and left eye visual fields normal.  Neck:     Thyroid: No thyromegaly.     Vascular: No carotid bruit.     Trachea: Trachea normal.  Cardiovascular:     Rate and Rhythm: Normal rate and regular rhythm.     Heart sounds: Normal heart sounds. No murmur heard. No gallop.   Pulmonary:     Effort: Pulmonary effort is normal. No accessory muscle usage or respiratory distress.     Breath sounds: Normal breath sounds.  Abdominal:     General: Bowel sounds are normal.     Palpations: Abdomen is soft. There is no hepatomegaly or splenomegaly.     Tenderness: There is no abdominal tenderness. There is no right CVA tenderness or left CVA tenderness.  Musculoskeletal:        General: Normal range of motion.     Cervical back: Normal range of motion and neck supple.     Right lower leg: No edema.     Left lower leg: No edema.  Lymphadenopathy:     Head:     Right side of head: No submental, submandibular, tonsillar, preauricular or posterior auricular adenopathy.     Left side of head: No submental, submandibular, tonsillar, preauricular or posterior auricular adenopathy.     Cervical: No cervical adenopathy.  Skin:    General: Skin is warm and dry.     Capillary Refill: Capillary refill takes less than 2 seconds.     Findings: Abrasion present. No rash.     Comments: To right finger with crusting.  Neurological:     Mental Status: She is alert and oriented to person, place, and time.     Cranial Nerves: Cranial nerves are intact.     Gait: Gait is intact.     Deep Tendon Reflexes: Reflexes are normal and symmetric.     Reflex Scores:      Brachioradialis reflexes are 2+ on the right side and 2+ on the left side.      Patellar reflexes are 2+ on the right side and 2+ on the left side. Psychiatric:        Attention and Perception: Attention  normal.        Mood and Affect: Mood normal.        Speech: Speech normal.        Behavior: Behavior normal. Behavior is cooperative.        Thought Content: Thought content normal.        Judgment: Judgment normal.    Results for orders placed or performed in visit on 06/06/20  Ferritin  Result Value Ref Range   Ferritin 80 11 - 307 ng/mL  Iron and TIBC  Result Value Ref Range   Iron 40 28 - 170 ug/dL   TIBC 295 250 - 450 ug/dL   Saturation Ratios 14 10.4 - 31.8 %   UIBC 255 ug/dL  Comprehensive metabolic panel  Result Value Ref Range   Sodium 143 135 - 145 mmol/L   Potassium 3.7 3.5 - 5.1 mmol/L   Chloride 103 98 - 111 mmol/L   CO2 29 22 - 32 mmol/L   Glucose, Bld 110 (H) 70 - 99 mg/dL   BUN 17 8 - 23 mg/dL   Creatinine, Ser 0.99 0.44 -  1.00 mg/dL   Calcium 9.2 8.9 - 10.3 mg/dL   Total Protein 7.2 6.5 - 8.1 g/dL   Albumin 4.0 3.5 - 5.0 g/dL   AST 21 15 - 41 U/L   ALT 18 0 - 44 U/L   Alkaline Phosphatase 65 38 - 126 U/L   Total Bilirubin 0.7 0.3 - 1.2 mg/dL   GFR, Estimated 59 (L) >60 mL/min   Anion gap 11 5 - 15  CBC with Differential/Platelet  Result Value Ref Range   WBC 8.4 4.0 - 10.5 K/uL   RBC 4.56 3.87 - 5.11 MIL/uL   Hemoglobin 11.2 (L) 12.0 - 15.0 g/dL   HCT 36.6 36.0 - 46.0 %   MCV 80.3 80.0 - 100.0 fL   MCH 24.6 (L) 26.0 - 34.0 pg   MCHC 30.6 30.0 - 36.0 g/dL   RDW 14.7 11.5 - 15.5 %   Platelets 247 150 - 400 K/uL   nRBC 0.0 0.0 - 0.2 %   Neutrophils Relative % 54 %   Neutro Abs 4.6 1.7 - 7.7 K/uL   Lymphocytes Relative 34 %   Lymphs Abs 2.8 0.7 - 4.0 K/uL   Monocytes Relative 9 %   Monocytes Absolute 0.8 0.1 - 1.0 K/uL   Eosinophils Relative 3 %   Eosinophils Absolute 0.2 0.0 - 0.5 K/uL   Basophils Relative 0 %   Basophils Absolute 0.0 0.0 - 0.1 K/uL   Immature Granulocytes 0 %   Abs Immature Granulocytes 0.01 0.00 - 0.07 K/uL      Assessment & Plan:   Problem List Items Addressed This Visit      Cardiovascular and Mediastinum    Hypertension associated with diabetes ([place])    Chronic, ongoing with BP at goal today. Continue to recommend checking BP at home a few mornings a week and documenting.  Continue current medication regimen and adjust as needed. CMP, TSH, CBC today. Would consider addition of ACE or ARB with d/c of HCTZ at next visit, will discuss with patient as unsure she has tried these before.  Return in 3 months.      Relevant Orders   CBC with Differential/Platelet   Comprehensive metabolic panel   TSH   Bayer DCA Hb A1c Waived   Aortic atherosclerosis ([facility])    Chronic, ongoing noted on imaging CT 01/26/17.  Continue daily statin and ASA for prevention and monitor closely.        Respiratory   Sleep apnea    Sleep study performed, could not afford to return to clinic or obtain equipment.  Will continue to work with CCM team on this.        Digestive   GERD (gastroesophageal reflux disease)    Chronic, stable. Continue omeprazole. Educated on double swallow, chin tuck, bite size portions, and food diary with symptoms.  Mag level today.      Relevant Orders   Magnesium     Endocrine   Hyperlipidemia associated with type 2 diabetes mellitus ([facility])    Chronic, ongoing.  Continue current medication regimen and adjust as needed.  Lipid panel today.      Relevant Orders   Lipid Panel w/o Chol/HDL Ratio   Bayer DCA Hb A1c Waived   Diabetic neuropathy ([facility]) - Primary    Chronic, ongoing with recent A1C 6.5%.  Continue Metformin 500 MG twice a day and Gabapentin for neuropathy + continuing to monitor blood sugar daily.  Plan to obtain CMP today to ensure no renal dosing needed  and will adjust medication dose as needed. Return in 3 months.      CKD stage 3 due to type 2 diabetes mellitus ([facility])    Chronic, ongoing with today A1C 6.5% and last GFR 59 -- urine ALB 30 today.  Continue current medication regimen and renal dose Metformin in future if decline noted.  Recommend continue to check BS at home  consistently and document.  Consider addition of ACE or ARB in future, will discuss with her next visit as unsure she has tried these before.  Return in 3 months.      Relevant Orders   Comprehensive metabolic panel   Bayer DCA Hb A1c Waived   Microalbumin, Urine Waived     Other   Iron deficiency anemia    Chronic, ongoing.  Continue current medication regimen and collaboration with hematology.       B12 deficiency    Continue monthly injections and obtain B12 level next visit.      Urge incontinence    Chronic, ongoing with worsening.  Has tried Kegel exercises for years, which offered benefit, but no longer helping.  Will trial Myrbetriq 25 MG daily, educated her on this medication and use.  This is preferred medication for patients >65 and safest for this age group, will avoid Ditropan.  Return in 3 months.      Relevant Medications   mirabegron ER (MYRBETRIQ) 25 MG TB24 tablet    Other Visit Diagnoses    Urgency of urination       Urine showing UA negative today.   Relevant Orders   Urinalysis, Routine w reflex microscopic   Abrasion of right index finger, initial encounter       Update tetanus today.   Relevant Orders   Td vaccine greater than or equal to 7yo preservative free IM (Completed)   Annual physical exam       Annual labs today to include CBC, CMP, TSH, lipid.  Health maintenance reviewed -- will provider tetanus and B12 injections today.       Follow up plan: Return in about 3 months (around 11/24/2020) for T2DM, HTN/HLD, GERD, ANEMIA.   LABORATORY TESTING:  - Pap smear: not applicable  IMMUNIZATIONS:   - Tdap: Tetanus vaccination status reviewed: Td vaccination indicated and given today. - Influenza: Refused - Pneumovax: Up to date - Prevnar: Up to date - HPV: Not applicable - Zostavax vaccine: Refused  SCREENING: -Mammogram: Not applicable age 11 and wishes not to continue - Colonoscopy: Up to date  - Bone Density: Up to date  -Hearing Test:  Not applicable  -Spirometry: Not applicable   PATIENT COUNSELING:   Advised to take 1 mg of folate supplement per day if capable of pregnancy.   Sexuality: Discussed sexually transmitted diseases, partner selection, use of condoms, avoidance of unintended pregnancy  and contraceptive alternatives.   Advised to avoid cigarette smoking.  I discussed with the patient that most people either abstain from alcohol or drink within safe limits (<=14/week and <=4 drinks/occasion for males, <=7/weeks and <= 3 drinks/occasion for females) and that the risk for alcohol disorders and other health effects rises proportionally with the number of drinks per week and how often a drinker exceeds daily limits.  Discussed cessation/primary prevention of drug use and availability of treatment for abuse.   Diet: Encouraged to adjust caloric intake to maintain  or achieve ideal body weight, to reduce intake of dietary saturated fat and total fat, to limit sodium intake by avoiding  high sodium foods and not adding table salt, and to maintain adequate dietary potassium and calcium preferably from fresh fruits, vegetables, and low-fat dairy products.    Stressed the importance of regular exercise  Injury prevention: Discussed safety belts, safety helmets, smoke detector, smoking near bedding or upholstery.   Dental health: Discussed importance of regular tooth brushing, flossing, and dental visits.    NEXT PREVENTATIVE PHYSICAL DUE IN 1 YEAR. Return in about 3 months (around 11/24/2020) for T2DM, HTN/HLD, GERD, ANEMIA.

## 2020-08-24 NOTE — Assessment & Plan Note (Signed)
Chronic, ongoing with BP at goal today. Continue to recommend checking BP at home a few mornings a week and documenting.  Continue current medication regimen and adjust as needed. CMP, TSH, CBC today. Would consider addition of ACE or ARB with d/c of HCTZ at next visit, will discuss with patient as unsure she has tried these before.  Return in 3 months.

## 2020-08-24 NOTE — Assessment & Plan Note (Signed)
Continue monthly injections and obtain B12 level next visit.

## 2020-08-24 NOTE — Assessment & Plan Note (Signed)
Chronic, ongoing with recent A1C 6.5%.  Continue Metformin 500 MG twice a day and Gabapentin for neuropathy + continuing to monitor blood sugar daily.  Plan to obtain CMP today to ensure no renal dosing needed and will adjust medication dose as needed. Return in 3 months.

## 2020-08-25 LAB — LIPID PANEL W/O CHOL/HDL RATIO
Cholesterol, Total: 193 mg/dL (ref 100–199)
HDL: 50 mg/dL (ref >39)
LDL Chol Calc (NIH): 126 mg/dL — ABNORMAL HIGH (ref 0–99)
Triglycerides: 94 mg/dL (ref 0–149)
VLDL Cholesterol Cal: 17 mg/dL (ref 5–40)

## 2020-08-25 LAB — CBC WITH DIFFERENTIAL/PLATELET
Basophils Absolute: 0 10*3/uL (ref 0.0–0.2)
Basos: 0 %
EOS (ABSOLUTE): 0.3 10*3/uL (ref 0.0–0.4)
Eos: 6 %
Hematocrit: 35.1 % (ref 34.0–46.6)
Hemoglobin: 10.8 g/dL — ABNORMAL LOW (ref 11.1–15.9)
Immature Grans (Abs): 0 10*3/uL (ref 0.0–0.1)
Immature Granulocytes: 0 %
Lymphocytes Absolute: 1.9 10*3/uL (ref 0.7–3.1)
Lymphs: 31 %
MCH: 24 pg — ABNORMAL LOW (ref 26.6–33.0)
MCHC: 30.8 g/dL — ABNORMAL LOW (ref 31.5–35.7)
MCV: 78 fL — ABNORMAL LOW (ref 79–97)
Monocytes Absolute: 0.6 10*3/uL (ref 0.1–0.9)
Monocytes: 10 %
Neutrophils Absolute: 3.2 10*3/uL (ref 1.4–7.0)
Neutrophils: 53 %
Platelets: 257 10*3/uL (ref 150–450)
RBC: 4.5 x10E6/uL (ref 3.77–5.28)
RDW: 14.1 % (ref 11.7–15.4)
WBC: 6 10*3/uL (ref 3.4–10.8)

## 2020-08-25 LAB — COMPREHENSIVE METABOLIC PANEL
ALT: 17 IU/L (ref 0–32)
AST: 17 IU/L (ref 0–40)
Albumin/Globulin Ratio: 1.6 (ref 1.2–2.2)
Albumin: 4.1 g/dL (ref 3.7–4.7)
Alkaline Phosphatase: 70 IU/L (ref 44–121)
BUN/Creatinine Ratio: 19 (ref 12–28)
BUN: 17 mg/dL (ref 8–27)
Bilirubin Total: 0.3 mg/dL (ref 0.0–1.2)
CO2: 23 mmol/L (ref 20–29)
Calcium: 9.9 mg/dL (ref 8.7–10.3)
Chloride: 103 mmol/L (ref 96–106)
Creatinine, Ser: 0.91 mg/dL (ref 0.57–1.00)
Globulin, Total: 2.6 g/dL (ref 1.5–4.5)
Glucose: 91 mg/dL (ref 65–99)
Potassium: 4.4 mmol/L (ref 3.5–5.2)
Sodium: 142 mmol/L (ref 134–144)
Total Protein: 6.7 g/dL (ref 6.0–8.5)
eGFR: 65 mL/min/{1.73_m2} (ref >59)

## 2020-08-25 LAB — TSH: TSH: 0.666 u[IU]/mL (ref 0.450–4.500)

## 2020-08-25 LAB — MAGNESIUM: Magnesium: 1.2 mg/dL — ABNORMAL LOW (ref 1.6–2.3)

## 2020-08-26 ENCOUNTER — Other Ambulatory Visit: Payer: Self-pay | Admitting: Nurse Practitioner

## 2020-08-26 MED ORDER — MAGNESIUM CHLORIDE 64 MG PO TBEC: 1.0000 | DELAYED_RELEASE_TABLET | Freq: Two times a day (BID) | ORAL | 4 refills | Status: DC

## 2020-08-26 MED ORDER — ROSUVASTATIN CALCIUM 40 MG PO TABS: 40.0000 mg | ORAL_TABLET | Freq: Every day | ORAL | 4 refills | Status: DC

## 2020-08-26 NOTE — Progress Notes (Signed)
Good morning, please let Tina Mcdonald know her labs have returned: - Continue to show some mildly low hemoglobin (anemia), continue to follow with hematology (Dr. Tasia Catchings) as scheduled. - Kidney, liver, electrolytes, and thyroid all normal - Cholesterol levels continue to show elevation above goal of LDL, are you taking Atorvastatin 80 MG daily?  If so I would like to change this to Rosuvastatin max dose to see if this offers you better control and stop Atorvastatin.  Then recheck fasting labs next visit, if this does not get to goal we may also add Zetia to help get that LDL lowered.  Are you okay with this change, if so I will send in. - Magnesium level is on low side, this can happen with daily use of a PPI like Omeprazole.  I would like you to start taking SlowMag one tablet twice a day, then we will recheck next visit.  You may benefit from reduction in Omeprazole in future, even if to every other day, to help prevent low magnesium.  Any questions? Keep being awesome!!  Thank you for allowing me to participate in your care. Kindest regards, Farris Blash

## 2020-08-27 ENCOUNTER — Telehealth: Payer: Self-pay

## 2020-08-27 NOTE — Telephone Encounter (Signed)
Please reach out to patient insurance and see what I need to do to get this covered as it is the recommended one of her age group, other medications in this category are not recommended due to increased risks with patients over 50.  If we need PA I will work on letter for this.  Than you.

## 2020-08-27 NOTE — Telephone Encounter (Signed)
Spoke with Marzetta Board from [place] and was informed that patient may try Oxybutynin but will put patient at higher risk. Spoke with patient insurance and was informed patient may try Vesicare at her local pharmacy and it would be a $90 co-pay. Per Dr.Jolene advised me to reach out to patient to see if she would be willing to pay the co-pay. Spoke with patient to notify her of Dr.Jolene's recommendations and also informed patient about GoodRx to help bring the price of the co-pay down to about $40. Patient verbalized understanding and was provided with the appropriate information for the lower price.

## 2020-08-27 NOTE — Telephone Encounter (Signed)
Received a fax from [place] [facility] patient states the prescription for Myrbetriq 25MG  was too expensive for patient and she is requesting a prescription that is less expensive. Please advise?

## 2020-09-19 ENCOUNTER — Telehealth: Payer: Self-pay

## 2020-09-19 ENCOUNTER — Telehealth: Payer: Self-pay | Admitting: General Practice

## 2020-09-19 NOTE — Telephone Encounter (Signed)
  Chronic Care Management   Outreach Note  09/19/2020 Name: Tina Mcdonald MRN: [id number] DOB: 06-20-1941  Referred by: [name], NP Reason for referral : Appointment (RNCM: Follow up call for Chronic Disease Management and Care Coordination Needs)   An unsuccessful telephone outreach was attempted today. The patient was referred to the case management team for assistance with care management and care coordination.   Follow Up Plan: A HIPAA compliant phone message was left for the patient providing contact information and requesting a return call.   Noreene Larsson RN, MSN, [facility] Mobile: [phone]

## 2020-09-28 ENCOUNTER — Telehealth: Payer: Self-pay | Admitting: *Deleted

## 2020-09-28 NOTE — Telephone Encounter (Signed)
Please reschedule RN CM f/u at [facility]

## 2020-09-28 NOTE — Chronic Care Management (AMB) (Signed)
  Care Management   Note  09/28/2020 Name: Tina Mcdonald MRN: [id number] DOB: [birth year]  Tina Mcdonald is a 80 y.o. year old female who is a primary care patient of [name], NP and is actively engaged with the care management team. I reached out to [name] by phone today to assist with re-scheduling a follow up visit with the RN Case Manager  Follow up plan: Telephone appointment with care management team member scheduled for: 11/13/2020  [facility]

## 2020-09-28 NOTE — Telephone Encounter (Signed)
Patient rescheduled has been  for 11/13/2020   [facility]

## 2020-10-04 ENCOUNTER — Telehealth: Payer: Self-pay

## 2020-10-04 DIAGNOSIS — N3281 Overactive bladder: Secondary | ICD-10-CM

## 2020-10-04 DIAGNOSIS — N3941 Urge incontinence: Secondary | ICD-10-CM

## 2020-10-04 MED ORDER — MIRABEGRON ER 50 MG PO TB24: 50.0000 mg | ORAL_TABLET | Freq: Every day | ORAL | 0 refills | Status: DC

## 2020-10-04 NOTE — Telephone Encounter (Signed)
Called pt to get more details no answer vm full  Copied from [place] [id number]. Topic: General - Call Back - No Documentation >> Oct 04, 2020  3:24 PM Erick Blinks wrote: Pt declined appt, says she has female problems that she does not wish to disclose. Says she wants to speak to PCP or Nurse regarding her situation. Says she that she is not in any pain but is not sure how to explain her situation. Says PCP is aware.   Best contact: [phone]

## 2020-10-04 NOTE — Telephone Encounter (Signed)
Spoke to patient on telephone, she continues to have issues with urge incontinence and OAB -- started Myrbetriq 25 MG on 08/24/20, not offering much benefit as of yet.  Discussed options over phone with her as office is close tomorrow and unable to schedule.  Will increase her Mybetriq to 50 MG daily and place referral to urology to further assess and discuss treatments.  Educated her on this and with aging is something we at times see in females due to the pelvic muscles becoming weaker.  Encourage her to work on Kegel still and toileting schedule + avoid caffeine drinks.  Follow-up as scheduled.

## 2020-10-15 ENCOUNTER — Ambulatory Visit (INDEPENDENT_AMBULATORY_CARE_PROVIDER_SITE_OTHER): Payer: Medicare Other

## 2020-10-15 ENCOUNTER — Other Ambulatory Visit: Payer: Self-pay

## 2020-10-15 ENCOUNTER — Telehealth: Payer: Self-pay | Admitting: Nurse Practitioner

## 2020-10-15 DIAGNOSIS — E538 Deficiency of other specified B group vitamins: Secondary | ICD-10-CM | POA: Diagnosis not present

## 2020-10-15 MED ORDER — CYANOCOBALAMIN 1000 MCG/ML IJ SOLN
1000.0000 ug | Freq: Once | INTRAMUSCULAR | Status: AC
  Administered 2020-10-15: 1000 ug via INTRAMUSCULAR

## 2020-10-15 NOTE — Telephone Encounter (Signed)
Patient unsure if she should be taking magnesium chloride (SLOW-MAG) 64 MG TBEC SR tablet and mirabegron ER (MYRBETRIQ) 50 MG TB24 tablet together. Patient states she's not sure which medication is making her feel tired, seeking clinical advice

## 2020-10-15 NOTE — Telephone Encounter (Signed)
Called patient concerning feeling tired and how she is feeling weak and sleepy after starting medications , magnesium and myrbertiq. Patient reports she feels the myrbertiq has helped with urgency and incontinence. Patient reports she has been feeling weak and a "little" dizzy and lightheaded but not all of the time. Patient reports she feels like she "staggers" at times but does not feel unsafe walking. noticed poor appetite but is drinking plenty of fluids. Patient is coming in to clinic today for B12 injection. Instructed patient is she has any further symptoms or worsening symptoms to call back. Please advise .

## 2020-10-15 NOTE — Telephone Encounter (Signed)
Lvm to schedule per [facility]

## 2020-10-15 NOTE — Progress Notes (Signed)
Patient presents today for a B12 injection, received in Left deltoid, patient tolerated well

## 2020-10-15 NOTE — Telephone Encounter (Signed)
Please see if can get on schedule for myself or another provider in office over next 24 to 48 hours so can further assess please.

## 2020-10-17 NOTE — Telephone Encounter (Signed)
Called pt scheduled for tomorrow with Ander Purpura

## 2020-10-17 NOTE — Telephone Encounter (Signed)
Noted  

## 2020-10-18 ENCOUNTER — Encounter: Payer: Self-pay | Admitting: Nurse Practitioner

## 2020-10-18 ENCOUNTER — Other Ambulatory Visit: Payer: Self-pay

## 2020-10-18 ENCOUNTER — Ambulatory Visit (INDEPENDENT_AMBULATORY_CARE_PROVIDER_SITE_OTHER): Payer: Medicare Other | Admitting: Nurse Practitioner

## 2020-10-18 VITALS — BP 100/50 | HR 76 | Temp 98.6°F | Wt 156.8 lb

## 2020-10-18 DIAGNOSIS — R5383 Other fatigue: Secondary | ICD-10-CM | POA: Diagnosis not present

## 2020-10-18 NOTE — Patient Instructions (Addendum)
It was great to see you!  Stop your hydrochlorothiazide (HCTZ) fluid pill. Check your blood pressure twice a day and call us if your blood pressure goes below 100 or above 140. Flonase and zyrtec or claritin can help with allergies and fluid on your ear.   Let's follow-up in 1 week, sooner if you have concerns.  Take care,  Vance Peper, NP

## 2020-10-18 NOTE — Progress Notes (Signed)
Acute Office Visit  Subjective:    Patient ID: Tina Mcdonald, female    DOB: Oct 31, 1940, 80 y.o.   MRN: [id number]  Chief Complaint  Patient presents with  . Fatigue    Pt states she has been feeling tired and weak for the last 2 to 3 weeks. States she started feeling this way after starting SlowMag and Myrbetriq. States she stopped the [facility] a week ago but is still feeling fatigued.     HPI Patient is in today for tiredness and weakness for the last 2 weeks.   FATIGUE  Duration:  weeks 2-3 weeks Severity: severe  Onset: gradual Context when symptoms started:  Recently started slomag and myrbetriq around 10/04/20 Symptoms improve with rest: no  Depressive symptoms: no Stress/anxiety: no Insomnia: no  Snoring: unsure Daytime hypersomnolence:yes Wakes feeling refreshed: no Dysnea on exertion:  no Orthopnea/PND: no Chest pain: no Chronic cough: no Lower extremity edema: no Arthralgias:no Myalgias: no Weakness: yes Rash: no   Past Medical History:  Diagnosis Date  . Anemia   . Arthritis   . Chronic kidney disease   . Colon polyps   . Diabetes mellitus without complication ([place])   . Diabetic neuropathy ([place])   . Dyspnea   . GERD (gastroesophageal reflux disease)   . Hyperlipemia   . Hypertension   . Osteoarthritis   . Radiculopathy    weakness in right leg  . Sleep apnea    does not have her cpap anymore  . Stress incontinence   . Urticaria   . Vitamin B 12 deficiency   . Wears glasses     Past Surgical History:  Procedure Laterality Date  . ABDOMINAL HYSTERECTOMY    . APPENDECTOMY    . BREAST BIOPSY Right    right-neg  . BREAST BIOPSY Left    us/bx/clip-neg  . CARPAL TUNNEL RELEASE     right  . COLONOSCOPY    . COLONOSCOPY WITH PROPOFOL N/A 12/10/2015   Procedure: COLONOSCOPY WITH PROPOFOL;  Surgeon: Lucilla Lame, MD;  Location: [place];  Service: Endoscopy;  Laterality: N/A;  Diabetic - oral meds Sleep Apnea LATEX allergy  .  COLONOSCOPY WITH PROPOFOL N/A 01/17/2019   Procedure: COLONOSCOPY WITH PROPOFOL;  Surgeon: Virgel Manifold, MD;  Location: [place];  Service: Endoscopy;  Laterality: N/A;  . CYSTOSCOPY    . DILATION AND CURETTAGE OF UTERUS    . ESOPHAGOGASTRODUODENOSCOPY (EGD) WITH PROPOFOL N/A 01/17/2019   Procedure: ESOPHAGOGASTRODUODENOSCOPY (EGD) WITH PROPOFOL;  Surgeon: Virgel Manifold, MD;  Location: [place];  Service: Endoscopy;  Laterality: N/A;  . EYE SURGERY Bilateral    lens implant  . GIVENS CAPSULE STUDY N/A 05/04/2019   Procedure: GIVENS CAPSULE STUDY;  Surgeon: Virgel Manifold, MD;  Location: [facility];  Service: Endoscopy;  Laterality: N/A;  . LUMBAR FUSION  2009  . POLYPECTOMY  12/10/2015   Procedure: POLYPECTOMY;  Surgeon: Lucilla Lame, MD;  Location: [place];  Service: Endoscopy;;  . POLYPECTOMY  01/17/2019   Procedure: POLYPECTOMY;  Surgeon: Virgel Manifold, MD;  Location: [place];  Service: Endoscopy;;  . RESECTION DISTAL CLAVICAL Right 11/25/2013   Procedure: RIGHT SHOULDER OPEN RESECTION DISTAL CLAVICAL EXCISION SOFT TISSUE TUMOR SHOULDER DEEP SUBFASCIAL INTRAMUSCULAR/DEBRIDEMENT/ROTATOR CUFF REPAIR/BICEPS TENODESIS/MANIPULATION;  Surgeon: Renette Butters, MD;  Location: [facility];  Service: Orthopedics;  Laterality: Right;  . SHOULDER ARTHROSCOPY WITH ROTATOR CUFF REPAIR AND SUBACROMIAL DECOMPRESSION Right 11/25/2013   Procedure: SHOULDER ARTHROSCOPY WITH  DEBRIDEMENT ROTATOR CUFF REPAIR  A;  Surgeon: Renette Butters, MD;  Location: [place];  Service: Orthopedics;  Laterality: Right;    Family History  Problem Relation Age of Onset  . Heart disease Mother   . Diabetes Mother   . Hypertension Mother   . Heart disease Father   . Hypertension Father   . Diabetes Sister   . Hypertension Sister   . Heart disease Sister   . Kidney disease Sister   . Diabetes Sister   . Hypertension  Sister   . Breast cancer Cousin 15  . Non-Hodgkin's lymphoma Maternal Uncle     Social History   Socioeconomic History  . Marital status: Single    Spouse name: Not on file  . Number of children: Not on file  . Years of education: Not on file  . Highest education level: Some college, no degree  Occupational History  . Occupation: retired  Tobacco Use  . Smoking status: Former Smoker    Packs/day: 1.00    Years: 15.00    Pack years: 15.00    Quit date: 11/22/1997    Years since quitting: 22.9  . Smokeless tobacco: Never Used  Vaping Use  . Vaping Use: Never used  Substance and Sexual Activity  . Alcohol use: No  . Drug use: No  . Sexual activity: Not Currently  Other Topics Concern  . Not on file  Social History Narrative  . Not on file   Social Determinants of Health   Financial Resource Strain: Low Risk   . Difficulty of Paying Living Expenses: Not hard at all  Food Insecurity: No Food Insecurity  . Worried About Charity fundraiser in the Last Year: Never true  . Ran Out of Food in the Last Year: Never true  Transportation Needs: No Transportation Needs  . Lack of Transportation (Medical): No  . Lack of Transportation (Non-Medical): No  Physical Activity: Inactive  . Days of Exercise per Week: 0 days  . Minutes of Exercise per Session: 0 min  Stress: No Stress Concern Present  . Feeling of Stress : Not at all  Social Connections: Unknown  . Frequency of Communication with Friends and Family: More than three times a week  . Frequency of Social Gatherings with Friends and Family: More than three times a week  . Attends Religious Services: More than 4 times per year  . Active Member of Clubs or Organizations: Yes  . Attends Archivist Meetings: More than 4 times per year  . Marital Status: Not on file  Intimate Partner Violence: Not At Risk  . Fear of Current or Ex-Partner: No  . Emotionally Abused: No  . Physically Abused: No  . Sexually Abused: No     Outpatient Medications Prior to Visit  Medication Sig Dispense Refill  . acetaminophen (TYLENOL) 325 MG tablet Take 650 mg by mouth every 6 (six) hours as needed.    . blood glucose meter kit and supplies KIT Dispense based on patient and insurance preference. Use up to four times daily as directed. (FOR ICD-9 250.00, 250.01). 1 each 0  . ciclopirox (PENLAC) 8 % solution Apply topically at bedtime. Apply over nail and surrounding skin. Apply daily over previous coat. After seven (7) days, may remove with alcohol and continue cycle. 6.6 mL 1  . cloNIDine (CATAPRES) 0.1 MG tablet Take 0.1 mg by mouth 2 (two) times daily.    . diclofenac sodium (VOLTAREN) 1 % GEL  Apply 2 g topically 2 (two) times daily as needed. 1 Tube 2  . Ferrous Sulfate (SLOW FE PO) Take 1 tablet by mouth daily.    [name] gabapentin (NEURONTIN) 100 MG capsule Take 3 capsules (300 mg total) by mouth at bedtime. (Patient taking differently: Take 200 mg by mouth at bedtime.) 270 capsule 4  . glucose blood test strip Check daily. ICD 10 E11.9 100 each 12  . hydrochlorothiazide (HYDRODIURIL) 25 MG tablet Take 1 tablet (25 mg total) by mouth daily. 90 tablet 4  . magnesium chloride (SLOW-MAG) 64 MG TBEC SR tablet Take 1 tablet (64 mg total) by mouth 2 (two) times daily. 60 tablet 4  . metFORMIN (GLUCOPHAGE-XR) 500 MG 24 hr tablet Take 1 tablet (500 mg total) by mouth 2 (two) times daily. 180 tablet 4  . metoprolol succinate (TOPROL-XL) 50 MG 24 hr tablet Take 1 tablet (50 mg total) by mouth daily. Take with or immediately following a meal. 90 tablet 4  . mirabegron ER (MYRBETRIQ) 50 MG TB24 tablet Take 1 tablet (50 mg total) by mouth daily. 30 tablet 0  . omeprazole (PRILOSEC) 20 MG capsule Take 1 capsule (20 mg total) by mouth daily. 90 capsule 4  . rosuvastatin (CRESTOR) 40 MG tablet Take 1 tablet (40 mg total) by mouth daily. Stop Atorvastatin daily. 90 tablet 4  . vitamin C (ASCORBIC ACID) 250 MG tablet Take 2 tablets (500 mg total)  by mouth daily. (Patient taking differently: Take 250 mg by mouth daily.) 60 tablet 2   Facility-Administered Medications Prior to Visit  Medication Dose Route Frequency Provider Last Rate Last Admin  . cyanocobalamin ((VITAMIN B-12)) injection 1,000 mcg  1,000 mcg Intramuscular Q30 days Kathrine Haddock, NP   1,000 mcg at 08/17/20 1022    Allergies  Allergen Reactions  . Contrast Media [Iodinated Diagnostic Agents] Itching  . Penicillins Itching    Has patient had a PCN reaction causing immediate rash, facial/tongue/throat swelling, SOB or lightheadedness with hypotension: Yes Has patient had a PCN reaction causing severe rash involving mucus membranes or skin necrosis: Unknown Has patient had a PCN reaction that required hospitalization: Yes Has patient had a PCN reaction occurring within the last 10 years: Yes If all of the above answers are "NO", then may proceed with Cephalosporin use.  . Sulfa Antibiotics Itching  . Latex Rash  . Neosporin [Neomycin-Bacitracin Zn-Polymyx] Itching and Rash  . Other Rash    Pt has reaction to high acidity foods.     Review of Systems  Constitutional: Positive for fatigue.  HENT: Negative.   Respiratory: Negative.   Cardiovascular: Negative.   Genitourinary: Positive for urgency.  Musculoskeletal: Negative.   Neurological: Positive for dizziness. Negative for headaches.        Objective:    Physical Exam Vitals and nursing note reviewed.  Constitutional:      General: She is not in acute distress.    Appearance: Normal appearance.  HENT:     Head: Normocephalic.     Right Ear: Tympanic membrane, ear canal and external ear normal. No middle ear effusion.     Left Ear: A middle ear effusion is present.  Eyes:     Conjunctiva/sclera: Conjunctivae normal.  Cardiovascular:     Rate and Rhythm: Normal rate and regular rhythm.     Pulses: Normal pulses.     Heart sounds: Normal heart sounds.  Pulmonary:     Effort: Pulmonary effort is  normal.     Breath sounds:  Normal breath sounds.  Musculoskeletal:     Cervical back: Normal range of motion.  Skin:    General: Skin is warm.  Neurological:     General: No focal deficit present.     Mental Status: She is alert and oriented to person, place, and time.  Psychiatric:        Mood and Affect: Mood normal.        Behavior: Behavior normal.        Thought Content: Thought content normal.        Judgment: Judgment normal.     BP (!) 100/50 (BP Location: Left Arm, Patient Position: Sitting)   Pulse 76   Temp 98.6 F (37 C) (Oral)   Wt 156 lb 12.8 oz (71.1 kg)   LMP  (LMP Unknown)   SpO2 98%   BMI 30.12 kg/m  Wt Readings from Last 3 Encounters:  10/18/20 156 lb 12.8 oz (71.1 kg)  08/24/20 155 lb 3.2 oz (70.4 kg)  06/08/20 162 lb 4.8 oz (73.6 kg)    Health Maintenance Due  Topic Date Due  . OPHTHALMOLOGY EXAM  04/13/2020    There are no preventive care reminders to display for this patient.   Lab Results  Component Value Date   TSH 0.666 08/24/2020   Lab Results  Component Value Date   WBC 6.0 08/24/2020   HGB 10.8 (L) 08/24/2020   HCT 35.1 08/24/2020   MCV 78 (L) 08/24/2020   PLT 257 08/24/2020   Lab Results  Component Value Date   NA 142 08/24/2020   K 4.4 08/24/2020   CO2 23 08/24/2020   GLUCOSE 91 08/24/2020   BUN 17 08/24/2020   CREATININE 0.91 08/24/2020   BILITOT 0.3 08/24/2020   ALKPHOS 70 08/24/2020   AST 17 08/24/2020   ALT 17 08/24/2020   PROT 6.7 08/24/2020   ALBUMIN 4.1 08/24/2020   CALCIUM 9.9 08/24/2020   ANIONGAP 11 06/06/2020   EGFR 65 08/24/2020   Lab Results  Component Value Date   CHOL 193 08/24/2020   Lab Results  Component Value Date   HDL 50 08/24/2020   Lab Results  Component Value Date   LDLCALC 126 (H) 08/24/2020   Lab Results  Component Value Date   TRIG 94 08/24/2020   No results found for: CHOLHDL Lab Results  Component Value Date   HGBA1C 6.5 08/24/2020       Assessment & Plan:    Problem List Items Addressed This Visit      Other   Fatigue - Primary    New onset fatigue x 3 weeks. Blood pressure today is 100/50 manually. Symptoms most likely from low blood pressure. Stop HCTZ and monitor blood pressure twice a day at home. Call if her systolic blood pressure is less than 100 or over 140. Will check CMP, CBC today. If symptoms continue and blood pressure comes back up, will stop myrbetriq. Follow-up in 1 week. ER precautions discussed.       Relevant Orders   Comp Met (CMET)   CBC with Differential    Other Visit Diagnoses    Hypomagnesemia       check magnesium level today   Relevant Orders   Magnesium       No orders of the defined types were placed in this encounter.    Charyl Dancer, NP

## 2020-10-18 NOTE — Assessment & Plan Note (Signed)
New onset fatigue x 3 weeks. Blood pressure today is 100/50 manually. Symptoms most likely from low blood pressure. Stop HCTZ and monitor blood pressure twice a day at home. Call if her systolic blood pressure is less than 100 or over 140. Will check CMP, CBC today. If symptoms continue and blood pressure comes back up, will stop myrbetriq. Follow-up in 1 week. ER precautions discussed.

## 2020-10-19 ENCOUNTER — Telehealth: Payer: Self-pay | Admitting: Nurse Practitioner

## 2020-10-19 DIAGNOSIS — N289 Disorder of kidney and ureter, unspecified: Secondary | ICD-10-CM | POA: Diagnosis not present

## 2020-10-19 DIAGNOSIS — Z5321 Procedure and treatment not carried out due to patient leaving prior to being seen by health care provider: Secondary | ICD-10-CM | POA: Diagnosis not present

## 2020-10-19 LAB — COMPREHENSIVE METABOLIC PANEL
ALT: 28 IU/L (ref 0–32)
AST: 22 IU/L (ref 0–40)
Albumin/Globulin Ratio: 1.5 (ref 1.2–2.2)
Albumin: 4.1 g/dL (ref 3.7–4.7)
Alkaline Phosphatase: 76 IU/L (ref 44–121)
BUN/Creatinine Ratio: 13 (ref 12–28)
BUN: 38 mg/dL — ABNORMAL HIGH (ref 8–27)
Bilirubin Total: <0.2 mg/dL (ref 0.0–1.2)
CO2: 24 mmol/L (ref 20–29)
Calcium: 9.2 mg/dL (ref 8.7–10.3)
Chloride: 100 mmol/L (ref 96–106)
Creatinine, Ser: 3.03 mg/dL — ABNORMAL HIGH (ref 0.57–1.00)
Globulin, Total: 2.7 g/dL (ref 1.5–4.5)
Glucose: 200 mg/dL — ABNORMAL HIGH (ref 65–99)
Potassium: 3.3 mmol/L — ABNORMAL LOW (ref 3.5–5.2)
Sodium: 143 mmol/L (ref 134–144)
Total Protein: 6.8 g/dL (ref 6.0–8.5)
eGFR: 15 mL/min/{1.73_m2} — ABNORMAL LOW (ref >59)

## 2020-10-19 LAB — CBC WITH DIFFERENTIAL/PLATELET
Basophils Absolute: 0 10*3/uL (ref 0.0–0.2)
Basos: 1 %
EOS (ABSOLUTE): 0.2 10*3/uL (ref 0.0–0.4)
Eos: 3 %
Hematocrit: 31.9 % — ABNORMAL LOW (ref 34.0–46.6)
Hemoglobin: 9.9 g/dL — ABNORMAL LOW (ref 11.1–15.9)
Immature Grans (Abs): 0 10*3/uL (ref 0.0–0.1)
Immature Granulocytes: 0 %
Lymphocytes Absolute: 1.7 10*3/uL (ref 0.7–3.1)
Lymphs: 28 %
MCH: 24 pg — ABNORMAL LOW (ref 26.6–33.0)
MCHC: 31 g/dL — ABNORMAL LOW (ref 31.5–35.7)
MCV: 77 fL — ABNORMAL LOW (ref 79–97)
Monocytes Absolute: 0.6 10*3/uL (ref 0.1–0.9)
Monocytes: 10 %
Neutrophils Absolute: 3.4 10*3/uL (ref 1.4–7.0)
Neutrophils: 58 %
Platelets: 238 10*3/uL (ref 150–450)
RBC: 4.12 x10E6/uL (ref 3.77–5.28)
RDW: 14.1 % (ref 11.7–15.4)
WBC: 5.9 10*3/uL (ref 3.4–10.8)

## 2020-10-19 LAB — MAGNESIUM: Magnesium: 1.6 mg/dL (ref 1.6–2.3)

## 2020-10-19 NOTE — Progress Notes (Signed)
She has a large jump in her creatinine from normal levels to 3.03. I called Tina Mcdonald and let her know she needs to go to the Emergency Room. She is currently out of town and says that she will find one nearby.

## 2020-10-19 NOTE — Telephone Encounter (Signed)
Opened in error

## 2020-10-22 ENCOUNTER — Telehealth: Payer: Self-pay | Admitting: Nurse Practitioner

## 2020-10-22 ENCOUNTER — Telehealth: Payer: Self-pay

## 2020-10-22 ENCOUNTER — Other Ambulatory Visit: Payer: Medicare Other

## 2020-10-22 ENCOUNTER — Other Ambulatory Visit: Payer: Self-pay | Admitting: Nurse Practitioner

## 2020-10-22 ENCOUNTER — Other Ambulatory Visit: Payer: Self-pay

## 2020-10-22 DIAGNOSIS — N179 Acute kidney failure, unspecified: Secondary | ICD-10-CM | POA: Diagnosis not present

## 2020-10-22 DIAGNOSIS — R3129 Other microscopic hematuria: Secondary | ICD-10-CM

## 2020-10-22 NOTE — Telephone Encounter (Signed)
Noted  

## 2020-10-22 NOTE — Telephone Encounter (Signed)
Ordered labs for her today -- she is coming in around 2 to 2:30 pm to obtain these.  Spoke to her on phone, since she did not stay in ER and get assessed.  Reports she still feels "kind of achy" and back hurts some.  Educated her on current kidney concerns with CKD 4 (AKI present). Will repeat labs today and if abnormal obtain stat imaging CT pelvis or ultrasound tomorrow to assess kidneys + get into nephrology ASAP as she would prefer not to go to ER due to long wait times.

## 2020-10-22 NOTE — Telephone Encounter (Signed)
Patient recently seen in office and was noted to have significant decline in kidney function from her normal baseline, suspect AKI, she was to go to ER on Friday.  Ended up going and did not stay after having to wait 2 hours.  Brought patient into office today for repeat labs and urine.  Urine in office noting 2+ blood, 2 + protein, few bacteria, amorphous sediment and crystals + granular casts.  ?Kidney stones as patient did report back pain today on phone call with provider + had recent dizziness and decline in kidney function.  Spoke to patient on phone about urine, blood work from today is pending -- will be resulted tomorrow.  At this time will order CT scan pelvis/abdomen to further work-up outpatient as she prefers not to return to ER setting.

## 2020-10-22 NOTE — Telephone Encounter (Signed)
Copied from [place] [id number]. Topic: General - Call Back - No Documentation >> Oct 22, 2020  9:00 AM Loma [place] wrote: Copied from [facility]:POLIDC, Apolonio Schneiders 10/19/2020 06:19 PM Pt was upset because she said Jolene told her to go to the nearest ED, pt went to the ED and called asking why she needed to be there. Pt then stated she has been sitting there for hours and wants a call back from [place] (the office is closed). Pt was overall upset with the situation. FYI Copied from [place]: [name] (Patient) [name] (Patient) General - Other Summary: fyi Reason for CRM: Pt is currently in the ED for her acute Kidney injury, please call pt if she needs any further information to provide to the ED.  Please call pt back asap as she was not seen in ER this weekend as waited several hrs, was a group tour, back at home now but has not been seen wanting to know what to do, is really wanting a call from [facility].[phone] She is going to take a shower, if called within nxt 20 min... time now 9:10. Please call her back  again if still in the shower. Anxious.

## 2020-10-23 ENCOUNTER — Emergency Department: Payer: Medicare Other

## 2020-10-23 ENCOUNTER — Telehealth: Payer: Self-pay | Admitting: Nurse Practitioner

## 2020-10-23 ENCOUNTER — Other Ambulatory Visit: Payer: Self-pay

## 2020-10-23 ENCOUNTER — Inpatient Hospital Stay
Admission: EM | Admit: 2020-10-23 | Discharge: 2020-10-27 | DRG: 641 | Disposition: A | Discharge status: Home / Self Care | Payer: Medicare Other | Attending: Hospitalist | Admitting: Hospitalist

## 2020-10-23 DIAGNOSIS — E860 Dehydration: Principal | ICD-10-CM | POA: Diagnosis present

## 2020-10-23 DIAGNOSIS — Z79899 Other long term (current) drug therapy: Secondary | ICD-10-CM | POA: Diagnosis not present

## 2020-10-23 DIAGNOSIS — I700 Atherosclerosis of aorta: Secondary | ICD-10-CM | POA: Diagnosis not present

## 2020-10-23 DIAGNOSIS — E1140 Type 2 diabetes mellitus with diabetic neuropathy, unspecified: Secondary | ICD-10-CM | POA: Diagnosis present

## 2020-10-23 DIAGNOSIS — Z8249 Family history of ischemic heart disease and other diseases of the circulatory system: Secondary | ICD-10-CM

## 2020-10-23 DIAGNOSIS — Z9109 Other allergy status, other than to drugs and biological substances: Secondary | ICD-10-CM

## 2020-10-23 DIAGNOSIS — Z841 Family history of disorders of kidney and ureter: Secondary | ICD-10-CM

## 2020-10-23 DIAGNOSIS — Z87891 Personal history of nicotine dependence: Secondary | ICD-10-CM

## 2020-10-23 DIAGNOSIS — I152 Hypertension secondary to endocrine disorders: Secondary | ICD-10-CM | POA: Diagnosis not present

## 2020-10-23 DIAGNOSIS — Z833 Family history of diabetes mellitus: Secondary | ICD-10-CM | POA: Diagnosis not present

## 2020-10-23 DIAGNOSIS — R809 Proteinuria, unspecified: Secondary | ICD-10-CM | POA: Diagnosis not present

## 2020-10-23 DIAGNOSIS — E119 Type 2 diabetes mellitus without complications: Secondary | ICD-10-CM | POA: Diagnosis not present

## 2020-10-23 DIAGNOSIS — K219 Gastro-esophageal reflux disease without esophagitis: Secondary | ICD-10-CM | POA: Diagnosis present

## 2020-10-23 DIAGNOSIS — Z981 Arthrodesis status: Secondary | ICD-10-CM | POA: Diagnosis not present

## 2020-10-23 DIAGNOSIS — D631 Anemia in chronic kidney disease: Secondary | ICD-10-CM | POA: Diagnosis present

## 2020-10-23 DIAGNOSIS — Z20822 Contact with and (suspected) exposure to covid-19: Secondary | ICD-10-CM | POA: Diagnosis not present

## 2020-10-23 DIAGNOSIS — E1122 Type 2 diabetes mellitus with diabetic chronic kidney disease: Secondary | ICD-10-CM | POA: Diagnosis present

## 2020-10-23 DIAGNOSIS — E876 Hypokalemia: Secondary | ICD-10-CM | POA: Diagnosis present

## 2020-10-23 DIAGNOSIS — Z882 Allergy status to sulfonamides status: Secondary | ICD-10-CM

## 2020-10-23 DIAGNOSIS — N1831 Chronic kidney disease, stage 3a: Secondary | ICD-10-CM | POA: Diagnosis present

## 2020-10-23 DIAGNOSIS — G4730 Sleep apnea, unspecified: Secondary | ICD-10-CM | POA: Diagnosis present

## 2020-10-23 DIAGNOSIS — E1169 Type 2 diabetes mellitus with other specified complication: Secondary | ICD-10-CM | POA: Diagnosis not present

## 2020-10-23 DIAGNOSIS — Z7984 Long term (current) use of oral hypoglycemic drugs: Secondary | ICD-10-CM

## 2020-10-23 DIAGNOSIS — E785 Hyperlipidemia, unspecified: Secondary | ICD-10-CM | POA: Diagnosis present

## 2020-10-23 DIAGNOSIS — Z880 Allergy status to penicillin: Secondary | ICD-10-CM

## 2020-10-23 DIAGNOSIS — E118 Type 2 diabetes mellitus with unspecified complications: Secondary | ICD-10-CM | POA: Diagnosis not present

## 2020-10-23 DIAGNOSIS — E1159 Type 2 diabetes mellitus with other circulatory complications: Secondary | ICD-10-CM | POA: Diagnosis present

## 2020-10-23 DIAGNOSIS — N393 Stress incontinence (female) (male): Secondary | ICD-10-CM | POA: Diagnosis present

## 2020-10-23 DIAGNOSIS — I10 Essential (primary) hypertension: Secondary | ICD-10-CM | POA: Diagnosis not present

## 2020-10-23 DIAGNOSIS — M1990 Unspecified osteoarthritis, unspecified site: Secondary | ICD-10-CM | POA: Diagnosis not present

## 2020-10-23 DIAGNOSIS — I129 Hypertensive chronic kidney disease with stage 1 through stage 4 chronic kidney disease, or unspecified chronic kidney disease: Secondary | ICD-10-CM | POA: Diagnosis present

## 2020-10-23 DIAGNOSIS — Z90710 Acquired absence of both cervix and uterus: Secondary | ICD-10-CM | POA: Diagnosis not present

## 2020-10-23 DIAGNOSIS — Z91041 Radiographic dye allergy status: Secondary | ICD-10-CM

## 2020-10-23 DIAGNOSIS — N179 Acute kidney failure, unspecified: Secondary | ICD-10-CM | POA: Diagnosis not present

## 2020-10-23 DIAGNOSIS — R7989 Other specified abnormal findings of blood chemistry: Secondary | ICD-10-CM

## 2020-10-23 DIAGNOSIS — E1129 Type 2 diabetes mellitus with other diabetic kidney complication: Secondary | ICD-10-CM | POA: Diagnosis not present

## 2020-10-23 DIAGNOSIS — Z883 Allergy status to other anti-infective agents status: Secondary | ICD-10-CM

## 2020-10-23 LAB — COMPREHENSIVE METABOLIC PANEL
ALT: 31 IU/L (ref 0–32)
ALT: 28 U/L (ref 0–44)
AST: 30 IU/L (ref 0–40)
AST: 31 U/L (ref 15–41)
Albumin/Globulin Ratio: 1.6 (ref 1.2–2.2)
Albumin: 4 g/dL (ref 3.7–4.7)
Albumin: 3.6 g/dL (ref 3.5–5.0)
Alkaline Phosphatase: 75 IU/L (ref 44–121)
Alkaline Phosphatase: 58 U/L (ref 38–126)
Anion gap: 11 (ref 5–15)
BUN/Creatinine Ratio: 13 (ref 12–28)
BUN: 44 mg/dL — ABNORMAL HIGH (ref 8–27)
BUN: 43 mg/dL — ABNORMAL HIGH (ref 8–23)
Bilirubin Total: <0.2 mg/dL (ref 0.0–1.2)
CO2: 22 mmol/L (ref 20–29)
CO2: 25 mmol/L (ref 22–32)
Calcium: 9.2 mg/dL (ref 8.7–10.3)
Calcium: 9 mg/dL (ref 8.9–10.3)
Chloride: 101 mmol/L (ref 96–106)
Chloride: 107 mmol/L (ref 98–111)
Creatinine, Ser: 3.45 mg/dL — ABNORMAL HIGH (ref 0.57–1.00)
Creatinine, Ser: 3.56 mg/dL — ABNORMAL HIGH (ref 0.44–1.00)
GFR, Estimated: 13 mL/min — ABNORMAL LOW (ref >60)
Globulin, Total: 2.5 g/dL (ref 1.5–4.5)
Glucose, Bld: 147 mg/dL — ABNORMAL HIGH (ref 70–99)
Glucose: 219 mg/dL — ABNORMAL HIGH (ref 65–99)
Potassium: 3.5 mmol/L (ref 3.5–5.2)
Potassium: 3.4 mmol/L — ABNORMAL LOW (ref 3.5–5.1)
Sodium: 141 mmol/L (ref 134–144)
Sodium: 143 mmol/L (ref 135–145)
Total Bilirubin: 0.7 mg/dL (ref 0.3–1.2)
Total Protein: 6.5 g/dL (ref 6.0–8.5)
Total Protein: 7.1 g/dL (ref 6.5–8.1)
eGFR: 13 mL/min/{1.73_m2} — ABNORMAL LOW (ref >59)

## 2020-10-23 LAB — URINALYSIS, COMPLETE (UACMP) WITH MICROSCOPIC
Bilirubin Urine: NEGATIVE
Glucose, UA: NEGATIVE mg/dL
Ketones, ur: NEGATIVE mg/dL
Leukocytes,Ua: NEGATIVE
Nitrite: NEGATIVE
Protein, ur: 30 mg/dL — AB
Specific Gravity, Urine: 1.011 (ref 1.005–1.030)
pH: 5 (ref 5.0–8.0)

## 2020-10-23 LAB — CBC WITH DIFFERENTIAL/PLATELET
Basophils Absolute: 0 10*3/uL (ref 0.0–0.2)
Basos: 0 %
EOS (ABSOLUTE): 0.3 10*3/uL (ref 0.0–0.4)
Eos: 5 %
Hematocrit: 30.9 % — ABNORMAL LOW (ref 34.0–46.6)
Hemoglobin: 9.8 g/dL — ABNORMAL LOW (ref 11.1–15.9)
Immature Grans (Abs): 0 10*3/uL (ref 0.0–0.1)
Immature Granulocytes: 0 %
Lymphocytes Absolute: 1.6 10*3/uL (ref 0.7–3.1)
Lymphs: 25 %
MCH: 24.4 pg — ABNORMAL LOW (ref 26.6–33.0)
MCHC: 31.7 g/dL (ref 31.5–35.7)
MCV: 77 fL — ABNORMAL LOW (ref 79–97)
Monocytes Absolute: 0.6 10*3/uL (ref 0.1–0.9)
Monocytes: 9 %
Neutrophils Absolute: 4 10*3/uL (ref 1.4–7.0)
Neutrophils: 61 %
Platelets: 233 10*3/uL (ref 150–450)
RBC: 4.02 x10E6/uL (ref 3.77–5.28)
RDW: 14.4 % (ref 11.7–15.4)
WBC: 6.5 10*3/uL (ref 3.4–10.8)

## 2020-10-23 LAB — MICROSCOPIC EXAMINATION: WBC, UA: NONE SEEN /hpf (ref 0–5)

## 2020-10-23 LAB — CBC
HCT: 32.1 % — ABNORMAL LOW (ref 36.0–46.0)
Hemoglobin: 9.9 g/dL — ABNORMAL LOW (ref 12.0–15.0)
MCH: 24.3 pg — ABNORMAL LOW (ref 26.0–34.0)
MCHC: 30.8 g/dL (ref 30.0–36.0)
MCV: 78.9 fL — ABNORMAL LOW (ref 80.0–100.0)
Platelets: 218 10*3/uL (ref 150–400)
RBC: 4.07 MIL/uL (ref 3.87–5.11)
RDW: 15.1 % (ref 11.5–15.5)
WBC: 5.6 10*3/uL (ref 4.0–10.5)
nRBC: 0 % (ref 0.0–0.2)

## 2020-10-23 LAB — URINALYSIS, ROUTINE W REFLEX MICROSCOPIC
Bilirubin, UA: NEGATIVE
Glucose, UA: NEGATIVE
Ketones, UA: NEGATIVE
Leukocytes,UA: NEGATIVE
Nitrite, UA: NEGATIVE
Specific Gravity, UA: 1.02 (ref 1.005–1.030)
Urobilinogen, Ur: 0.2 mg/dL (ref 0.2–1.0)
pH, UA: 5.5 (ref 5.0–7.5)

## 2020-10-23 LAB — URIC ACID: Uric Acid: 5.6 mg/dL (ref 3.1–7.9)

## 2020-10-23 LAB — PTH, INTACT AND CALCIUM: PTH: 94 pg/mL — ABNORMAL HIGH (ref 15–65)

## 2020-10-23 LAB — CK
Total CK: 770 U/L (ref 32–182)
Total CK: 652 U/L — ABNORMAL HIGH (ref 38–234)

## 2020-10-23 MED ORDER — SODIUM CHLORIDE 0.9 % IV BOLUS
500.0000 mL | Freq: Once | INTRAVENOUS | Status: AC
  Administered 2020-10-23: 500 mL via INTRAVENOUS

## 2020-10-23 MED ORDER — SODIUM CHLORIDE 0.9 % IV SOLN: Freq: Once | INTRAVENOUS | Status: AC

## 2020-10-23 MED ORDER — SODIUM CHLORIDE 0.45 % IV SOLN: INTRAVENOUS | Status: AC

## 2020-10-23 MED ORDER — ACETAMINOPHEN 650 MG RE SUPP: 650.0000 mg | Freq: Four times a day (QID) | RECTAL | Status: DC | PRN

## 2020-10-23 MED ORDER — HEPARIN SODIUM (PORCINE) 5000 UNIT/ML IJ SOLN
5000.0000 [IU] | Freq: Three times a day (TID) | INTRAMUSCULAR | Status: DC
  Administered 2020-10-23 – 2020-10-27 (×11): 5000 [IU] via SUBCUTANEOUS
  Filled 2020-10-23 (×11): qty 1

## 2020-10-23 MED ORDER — INSULIN ASPART 100 UNIT/ML IJ SOLN
0.0000 [IU] | Freq: Three times a day (TID) | INTRAMUSCULAR | Status: DC
Start: 2020-10-24 — End: 2020-10-27
  Administered 2020-10-25 – 2020-10-26 (×2): 1 [IU] via SUBCUTANEOUS
  Filled 2020-10-23 (×2): qty 1

## 2020-10-23 MED ORDER — ACETAMINOPHEN 325 MG PO TABS: 650.0000 mg | ORAL_TABLET | Freq: Four times a day (QID) | ORAL | Status: DC | PRN

## 2020-10-23 NOTE — ED Triage Notes (Signed)
Pt instructed to come here due to abnormal labs. Pt creatine has elevated. Pt stable in triage.

## 2020-10-23 NOTE — Progress Notes (Signed)
Refer to telephone note 10/23/20 -- to ER

## 2020-10-23 NOTE — H&P (Signed)
History and Physical:    Tina Mcdonald   BZJ:[id number] DOB: 1940-12-07 DOA: 10/23/2020  Referring MD/provider: Dr. Quentin Cornwall PCP: [name], NP   Patient coming from: Home  Chief Complaint: Patient was told to come in for acute kidney injury over the past week.  History of Present Illness:   Tina Mcdonald is an 80 y.o. 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  PCP attempted to contact patient to ask her to go the ED however she was out of town.  She did go to the ED but left before she could be seen last week.  Patient notes the only medication changes she has had has been per her PCP which have included addition of oxybutynin, holding of metformin and HCTZ and gabapentin and changing of her statins due to intermittent fatigue.  Patient states she generally feels quite well.  She has no new concerns.  She has her chronic concerns of urge incontinence, BPV and intermittent back pain.  These have not changed recently.  Patient denies any fevers or chills.  No new abdominal pain.  She did have an episode of diarrhea for 1 day 2 weeks ago.  No hematochezia.  No nausea or vomiting.  No anorexia or weight loss.  Patient states she is still urinating as much as usual and denies any new lower extremity edema.  Patient has baseline DOE which is without change.  ED Course:  The patient was noted to have a creatinine of 3.56 today.  She was not fluid overloaded.  She was discussed with nephrology who recommended admission for further work-up.  Patient underwent a renal ultrasound which showed echogenic kidneys consistent with medical renal disease.  No hydronephrosis.  ROS:   ROS   Review of Systems: As per HPI  Past Medical History:   Past Medical  History:  Diagnosis Date  . Anemia   . Arthritis   . Chronic kidney disease   . Colon polyps   . Diabetes mellitus without complication ([place])   . Diabetic neuropathy ([place])   . Dyspnea   . GERD (gastroesophageal reflux disease)   . Hyperlipemia   . Hypertension   . Osteoarthritis   . Radiculopathy    weakness in right leg  . Sleep apnea    does not have her cpap anymore  . Stress incontinence   . Urticaria   . Vitamin B 12 deficiency   . Wears glasses     Past Surgical History:   Past Surgical History:  Procedure Laterality Date  . ABDOMINAL HYSTERECTOMY    . APPENDECTOMY    . BREAST BIOPSY Right    right-neg  . BREAST BIOPSY Left    us/bx/clip-neg  . CARPAL TUNNEL RELEASE     right  . COLONOSCOPY    . COLONOSCOPY WITH PROPOFOL N/A 12/10/2015   Procedure: COLONOSCOPY WITH PROPOFOL;  Surgeon: Lucilla Lame, MD;  Location: [facility];  Service: Endoscopy;  Laterality: N/A;  Diabetic - oral meds Sleep Apnea LATEX allergy  . COLONOSCOPY WITH PROPOFOL N/A 01/17/2019   Procedure: COLONOSCOPY WITH PROPOFOL;  Surgeon: Virgel Manifold, MD;  Location: [facility];  Service: Endoscopy;  Laterality: N/A;  . CYSTOSCOPY    . DILATION AND CURETTAGE OF UTERUS    .  ESOPHAGOGASTRODUODENOSCOPY (EGD) WITH PROPOFOL N/A 01/17/2019   Procedure: ESOPHAGOGASTRODUODENOSCOPY (EGD) WITH PROPOFOL;  Surgeon: Virgel Manifold, MD;  Location: [facility];  Service: Endoscopy;  Laterality: N/A;  . EYE SURGERY Bilateral    lens implant  . GIVENS CAPSULE STUDY N/A 05/04/2019   Procedure: GIVENS CAPSULE STUDY;  Surgeon: Virgel Manifold, MD;  Location: [facility];  Service: Endoscopy;  Laterality: N/A;  . LUMBAR FUSION  2009  . POLYPECTOMY  12/10/2015   Procedure: POLYPECTOMY;  Surgeon: Lucilla Lame, MD;  Location: [facility];  Service: Endoscopy;;  . POLYPECTOMY  01/17/2019   Procedure: POLYPECTOMY;  Surgeon: Virgel Manifold, MD;  Location: [facility];  Service: Endoscopy;;  . RESECTION DISTAL CLAVICAL Right 11/25/2013   Procedure: RIGHT SHOULDER OPEN RESECTION DISTAL CLAVICAL EXCISION SOFT TISSUE TUMOR SHOULDER DEEP SUBFASCIAL INTRAMUSCULAR/DEBRIDEMENT/ROTATOR CUFF REPAIR/BICEPS TENODESIS/MANIPULATION;  Surgeon: Renette Butters, MD;  Location: [facility];  Service: Orthopedics;  Laterality: Right;  . SHOULDER ARTHROSCOPY WITH ROTATOR CUFF REPAIR AND SUBACROMIAL DECOMPRESSION Right 11/25/2013   Procedure: SHOULDER ARTHROSCOPY WITH DEBRIDEMENT ROTATOR CUFF REPAIR  A;  Surgeon: Renette Butters, MD;  Location: [facility];  Service: Orthopedics;  Laterality: Right;    Social History:   Social History   Socioeconomic History  . Marital status: Single    Spouse name: Not on file  . Number of children: Not on file  . Years of education: Not on file  . Highest education level: Some college, no degree  Occupational History  . Occupation: retired  Tobacco Use  . Smoking status: Former Smoker    Packs/day: 1.00    Years: 15.00    Pack years: 15.00    Quit date: 11/22/1997    Years since quitting: 22.9  . Smokeless tobacco: Never Used  Vaping Use  . Vaping Use: Never used  Substance and Sexual Activity  . Alcohol use: No  . Drug use: No  . Sexual activity: Not Currently  Other Topics Concern  . Not on file  Social History Narrative  . Not on file   Social Determinants of Health   Financial Resource Strain: Low Risk   . Difficulty of Paying Living Expenses: Not hard at all  Food Insecurity: No Food Insecurity  . Worried About Charity fundraiser in the Last Year: Never true  . Ran Out of Food in the Last Year: Never true  Transportation Needs: No Transportation Needs  . Lack of Transportation (Medical): No  . Lack of Transportation (Non-Medical): No  Physical Activity: Inactive  . Days of Exercise per Week: 0 days  . Minutes of Exercise per Session: 0 min  Stress: No Stress Concern  Present  . Feeling of Stress : Not at all  Social Connections: Unknown  . Frequency of Communication with Friends and Family: More than three times a week  . Frequency of Social Gatherings with Friends and Family: More than three times a week  . Attends Religious Services: More than 4 times per year  . Active Member of Clubs or Organizations: Yes  . Attends Archivist Meetings: More than 4 times per year  . Marital Status: Not on file  Intimate Partner Violence: Not At Risk  . Fear of Current or Ex-Partner: No  . Emotionally Abused: No  . Physically Abused: No  . Sexually Abused: No    Allergies   Contrast media [iodinated diagnostic agents], Penicillins, Sulfa antibiotics, Latex, Neosporin [neomycin-bacitracin zn-polymyx], and Other  Family history:   Family History  Problem Relation Age of Onset  . Heart disease Mother   . Diabetes Mother   . Hypertension Mother   . Heart disease Father   . Hypertension Father   . Diabetes Sister   . Hypertension Sister   . Heart disease Sister   . Kidney disease Sister   . Diabetes Sister   . Hypertension Sister   . Breast cancer Cousin 74  . Non-Hodgkin's lymphoma Maternal Uncle     Current Medications:   Prior to Admission medications   Medication Sig Start Date End Date Taking? Authorizing Provider  acetaminophen (TYLENOL) 325 MG tablet Take 650 mg by mouth every 6 (six) hours as needed.    [provider]  blood glucose meter kit and supplies KIT Dispense based on patient and insurance preference. Use up to four times daily as directed. (FOR ICD-9 250.00, 250.01). 09/22/18   [name], Henrine Screws T, NP  ciclopirox (PENLAC) 8 % solution Apply topically at bedtime. Apply over nail and surrounding skin. Apply daily over previous coat. After seven (7) days, may remove with alcohol and continue cycle. 04/13/20   [name], Henrine Screws T, NP  cloNIDine (CATAPRES) 0.1 MG tablet Take 0.1 mg by mouth 2 (two) times daily.    [provider]  diclofenac sodium (VOLTAREN) 1 % GEL Apply 2 g topically 2 (two) times daily as needed. 11/17/18   [name], Henrine Screws T, NP  Ferrous Sulfate (SLOW FE PO) Take 1 tablet by mouth daily.    [provider]  gabapentin (NEURONTIN) 100 MG capsule Take 3 capsules (300 mg total) by mouth at bedtime. Patient taking differently: Take 200 mg by mouth at bedtime. 11/11/19   [name], Henrine Screws T, NP  glucose blood test strip Check daily. ICD 10 E11.9 01/16/15   Kathrine Haddock, NP  magnesium chloride (SLOW-MAG) 64 MG TBEC SR tablet Take 1 tablet (64 mg total) by mouth 2 (two) times daily. 08/26/20   [name], Henrine Screws T, NP  metFORMIN (GLUCOPHAGE-XR) 500 MG 24 hr tablet Take 1 tablet (500 mg total) by mouth 2 (two) times daily. 11/11/19   [name], Henrine Screws T, NP  metoprolol succinate (TOPROL-XL) 50 MG 24 hr tablet Take 1 tablet (50 mg total) by mouth daily. Take with or immediately following a meal. 11/11/19   [name], Henrine Screws T, NP  omeprazole (PRILOSEC) 20 MG capsule Take 1 capsule (20 mg total) by mouth daily. 11/11/19   [name], Henrine Screws T, NP  rosuvastatin (CRESTOR) 40 MG tablet Take 1 tablet (40 mg total) by mouth daily. Stop Atorvastatin daily. 08/26/20   [name], Henrine Screws T, NP  vitamin C (ASCORBIC ACID) 250 MG tablet Take 2 tablets (500 mg total) by mouth daily. Patient taking differently: Take 250 mg by mouth daily. 06/14/19   Earlie Server, MD    Physical Exam:   Vitals:   10/23/20 1352 10/23/20 1449 10/23/20 1630 10/23/20 1700  BP:  140/65 (!) 142/61 131/75  Pulse:  69  72  Resp:  17  18  Temp:      TempSrc:      SpO2:  100%  95%  Weight: 70.3 kg     Height: _0  (1.575 m)        Physical Exam: Blood pressure 131/75, pulse 72, temperature 98.6 F (37 C), temperature source Oral, resp. rate 18, height _1  (1.575 m), weight 70.3 kg, SpO2 95 %. Gen: Patient lying in bed watching TV in no acute distress. Eyes: sclera anicteric, conjuctiva  mildly injected bilaterally CVS: S1-S2, regulary,  no gallops Respiratory:  decreased air entry likely secondary to decreased inspiratory effort, no adventitious sounds. GI: NABS, abdomen is firm but not hard.  No tenderness to light or deep palpation. LE: No edema. No cyanosis Neuro: A/O x 3, Moving all extremities equally with normal strength, CN 3-12 intact, grossly nonfocal.  Psych: patient is logical and coherent, judgement and insight appear normal, mood and affect appropriate to situation. Skin: no rashes or lesions or ulcers,    Data Review:    Labs: Basic Metabolic Panel: Recent Labs  Lab 10/18/20 1427 10/22/20 1452 10/23/20 1449  NA 143 141 143  K 3.3* 3.5 3.4*  CL 100 101 107  CO2 _0 GLUCOSE 200* 219* 147*  BUN 38* 44* 43*  CREATININE 3.03* 3.45* 3.56*  CALCIUM 9.2 9.2 9.0  MG 1.6  --   --    Liver Function Tests: Recent Labs  Lab 10/18/20 1427 10/22/20 1452 10/23/20 1449  AST _1 ALT _2 ALKPHOS 76 75 58  BILITOT <0.2 <0.2 0.7  PROT 6.8 6.5 7.1  ALBUMIN 4.1 4.0 3.6   No results for input(s): LIPASE, AMYLASE in the last 168 hours. No results for input(s): AMMONIA in the last 168 hours. CBC: Recent Labs  Lab 10/18/20 1427 10/22/20 1452 10/23/20 1449  WBC 5.9 6.5 5.6  NEUTROABS 3.4 4.0  --   HGB 9.9* 9.8* 9.9*  HCT 31.9* 30.9* 32.1*  MCV 77* 77* 78.9*  PLT 238 233 218   Cardiac Enzymes: Recent Labs  Lab 10/22/20 1452 10/23/20 1449  CKTOTAL 770* 652*    BNP (last 3 results) No results for input(s): PROBNP in the last 8760 hours. CBG: No results for input(s): GLUCAP in the last 168 hours.  Urinalysis    Component Value Date/Time   COLORURINE YELLOW (A) 10/23/2020 1621   APPEARANCEUR CLOUDY (A) 10/23/2020 1621   APPEARANCEUR Cloudy (A) 10/22/2020 1447   LABSPEC 1.011 10/23/2020 1621   PHURINE 5.0 10/23/2020 1621   GLUCOSEU NEGATIVE 10/23/2020 1621   HGBUR SMALL (A) 10/23/2020 1621   BILIRUBINUR NEGATIVE 10/23/2020 1621   BILIRUBINUR Negative 10/22/2020 1447    KETONESUR NEGATIVE 10/23/2020 1621   PROTEINUR 30 (A) 10/23/2020 1621   NITRITE NEGATIVE 10/23/2020 1621   LEUKOCYTESUR NEGATIVE 10/23/2020 1621      Radiographic Studies: US Renal  Result Date: 10/23/2020 CLINICAL DATA:  Acute kidney injury EXAM: RENAL / URINARY TRACT ULTRASOUND COMPLETE COMPARISON:  03/23/2015, CT 02/05/2017 FINDINGS: Right Kidney: Renal measurements: 10.7 x 4 x 5.1 cm = volume: 112 mL. Cortex is slightly echogenic. No hydronephrosis or mass. Left Kidney: Renal measurements: 10.7 x 5 x 4.5 cm = volume: 125 mL. Cortex is slightly echogenic. No hydronephrosis or mass. Bladder: Appears normal for degree of bladder distention. Other: None. IMPRESSION: 1. Echogenic kidneys bilaterally consistent with medical renal disease. 2. Negative for hydronephrosis Electronically Signed   By: Donavan Foil M.D.   On: 10/23/2020 17:04    EKG: Ordered and pending   Assessment/Plan:   Principal Problem:   AKI (acute kidney injury) ([place]) Active Problems:   Type 2 diabetes mellitus with chronic kidney disease ([facility])   Hypertension associated with diabetes ([place])   Hyperlipidemia associated with type 2 diabetes mellitus ([place])   Diabetic neuropathy ([place])  80 year old female with known chronic kidney disease secondary to hypertension and diabetes is admitted with subacute worsening of her kidney function with creatinine increased from 1-3  over the past couple of months.  Acute on chronic kidney disease Very possibly progression of kidney disease from hypertension and diabetes Patient notes her sister was on dialysis prior to her death Patient is on no new medications that are renal toxic, metformin and HCTZ have been stopped. Will order UA with microscopy. Half-normal saline at 100 cc an hour for the next 16 hours. Secure chat sent to Dr. Zollie Scale to please see in the morning.  HTN Await medicine reconciliation to restart medications, patient is presently normotensive  DM2 Patient  placed on very low dose SSI AC at bedtime  Dyslipidemia Statin can be restarted once her meds have been reconciled    Other information:   DVT prophylaxis: Subcu heparin ordered. Code Status: Full Family Communication: Patient's sister was present all day Disposition Plan: Home Consults called: Nephrology Admission status: Inpatient  [facility]  If 7PM-7AM, please contact night-coverage [email or web address]

## 2020-10-23 NOTE — Telephone Encounter (Signed)
Spoke to [place] on telephone and reviewed lab results.  Worsening kidney function noted.  She is scheduled for CT imaging tomorrow to assess for blockage.  [place] but [place] after waiting 2 hours.  Have HIGHLY recommended she return to ER today due to worsening function and elevation in CK and PTH.  She agrees with this plan of care.  Hold Metformin.  HCTZ was discontinued on weekend.  Hold Gabapentin.  Will have further assessed in ER setting today due to urgency and current AKI.  Follow-up with patient after ER visit.  Will call triage nurse in ER to alert.

## 2020-10-23 NOTE — ED Provider Notes (Signed)
[facility] Emergency Department Provider Note    Event Date/Time   First MD Initiated Contact with Patient 10/23/20 1406     (approximate)  I have reviewed the triage vital signs and the nursing notes.   HISTORY  Chief Complaint Abnormal Lab    HPI [name] is a 80 y.o. female below listed past medical history presents to the ER for worsening creatinine levels.  Has been describing generalized myalgias.  Was on statin as well as HCTZ and recently stopped this and had follow-up in outpatient clinic for repeat blood work but it was continued to worsen.  States she is having polyuria.  Denies any flank pain.  No history of kidney stones.  Does have family members that are on dialysis.    Past Medical History:  Diagnosis Date  . Anemia   . Arthritis   . Chronic kidney disease   . Colon polyps   . Diabetes mellitus without complication ([place])   . Diabetic neuropathy ([place] [facility])   . Dyspnea   . GERD (gastroesophageal reflux disease)   . Hyperlipemia   . Hypertension   . Osteoarthritis   . Radiculopathy    weakness in right leg  . Sleep apnea    does not have her cpap anymore  . Stress incontinence   . Urticaria   . Vitamin B 12 deficiency   . Wears glasses    Family History  Problem Relation Age of Onset  . Heart disease Mother   . Diabetes Mother   . Hypertension Mother   . Heart disease Father   . Hypertension Father   . Diabetes Sister   . Hypertension Sister   . Heart disease Sister   . Kidney disease Sister   . Diabetes Sister   . Hypertension Sister   . Breast cancer Cousin 68  . Non-Hodgkin's lymphoma Maternal Uncle    Past Surgical History:  Procedure Laterality Date  . ABDOMINAL HYSTERECTOMY    . APPENDECTOMY    . BREAST BIOPSY Right    right-neg  . BREAST BIOPSY Left    us/bx/clip-neg  . CARPAL TUNNEL RELEASE     right  . COLONOSCOPY    . COLONOSCOPY WITH PROPOFOL N/A 12/10/2015   Procedure: COLONOSCOPY WITH  PROPOFOL;  Surgeon: Lucilla Lame, MD;  Location: [place];  Service: [facility];  Laterality: N/A;  Diabetic - oral meds Sleep Apnea LATEX allergy  . COLONOSCOPY WITH PROPOFOL N/A 01/17/2019   Procedure: COLONOSCOPY WITH PROPOFOL;  Surgeon: Virgel Manifold, MD;  Location: [place];  Service: [facility];  Laterality: N/A;  . CYSTOSCOPY    . DILATION AND CURETTAGE OF UTERUS    . ESOPHAGOGASTRODUODENOSCOPY (EGD) WITH PROPOFOL N/A 01/17/2019   Procedure: ESOPHAGOGASTRODUODENOSCOPY (EGD) WITH PROPOFOL;  Surgeon: Virgel Manifold, MD;  Location: [facility];  Service: [facility];  Laterality: N/A;  . EYE SURGERY Bilateral    lens implant  . GIVENS CAPSULE STUDY N/A 05/04/2019   Procedure: GIVENS CAPSULE STUDY;  Surgeon: Virgel Manifold, MD;  Location: [facility];  Service: [facility];  Laterality: N/A;  . LUMBAR FUSION  2009  . POLYPECTOMY  12/10/2015   Procedure: POLYPECTOMY;  Surgeon: Lucilla Lame, MD;  Location: [facility];  Service: [facility];;  . POLYPECTOMY  01/17/2019   Procedure: POLYPECTOMY;  Surgeon: Virgel Manifold, MD;  Location: [facility];  Service: [facility];;  . RESECTION DISTAL CLAVICAL Right 11/25/2013   Procedure: RIGHT SHOULDER OPEN RESECTION DISTAL CLAVICAL EXCISION  SOFT TISSUE TUMOR SHOULDER DEEP SUBFASCIAL INTRAMUSCULAR/DEBRIDEMENT/ROTATOR CUFF REPAIR/BICEPS TENODESIS/MANIPULATION;  Surgeon: Renette Butters, MD;  Location: [facility];  Service: Orthopedics;  Laterality: Right;  . SHOULDER ARTHROSCOPY WITH ROTATOR CUFF REPAIR AND SUBACROMIAL DECOMPRESSION Right 11/25/2013   Procedure: SHOULDER ARTHROSCOPY WITH DEBRIDEMENT ROTATOR CUFF REPAIR  A;  Surgeon: Renette Butters, MD;  Location: [facility];  Service: Orthopedics;  Laterality: Right;   Patient Active Problem List   Diagnosis Date Noted  . Fatigue 10/18/2020  . Aortic atherosclerosis ([place]) 02/02/2020  . Nail fungus 04/12/2019  .  Diverticula, colon   . Hiatal hernia   . Sleep apnea 02/24/2017  . Urge incontinence 11/24/2016  . Benign neoplasm of sigmoid colon   . CKD stage 3 due to type 2 diabetes mellitus ([place]) 02/20/2015  . Polyp of sigmoid colon 01/16/2015  . Iron deficiency anemia 01/16/2015  . B12 deficiency 01/16/2015  . Radiculopathy 01/16/2015  . Osteoarthritis 01/16/2015  . Hypertension associated with diabetes ([place]) 01/16/2015  . Hyperlipidemia associated with type 2 diabetes mellitus ([place] [facility]) 01/16/2015  . Diabetic neuropathy ([place] [facility]) 01/16/2015  . GERD (gastroesophageal reflux disease) 01/16/2015      Prior to Admission medications   Medication Sig Start Date End Date Taking? Authorizing Provider  acetaminophen (TYLENOL) 325 MG tablet Take 650 mg by mouth every 6 (six) hours as needed.    [provider]  blood glucose meter kit and supplies KIT Dispense based on patient and insurance preference. Use up to four times daily as directed. (FOR ICD-9 250.00, 250.01). 09/22/18   [name], Henrine Screws T, NP  ciclopirox (PENLAC) 8 % solution Apply topically at bedtime. Apply over nail and surrounding skin. Apply daily over previous coat. After seven (7) days, may remove with alcohol and continue cycle. 04/13/20   [name], Henrine Screws T, NP  cloNIDine (CATAPRES) 0.1 MG tablet Take 0.1 mg by mouth 2 (two) times daily.    [provider]  diclofenac sodium (VOLTAREN) 1 % GEL Apply 2 g topically 2 (two) times daily as needed. 11/17/18   [name], Henrine Screws T, NP  Ferrous Sulfate (SLOW FE PO) Take 1 tablet by mouth daily.    [provider]  gabapentin (NEURONTIN) 100 MG capsule Take 3 capsules (300 mg total) by mouth at bedtime. Patient taking differently: Take 200 mg by mouth at bedtime. 11/11/19   [name], Henrine Screws T, NP  glucose blood test strip Check daily. ICD 10 E11.9 01/16/15   Kathrine Haddock, NP  magnesium chloride (SLOW-MAG) 64 MG TBEC SR tablet Take 1 tablet (64 mg total) by mouth 2 (two) times daily.  08/26/20   [name], Henrine Screws T, NP  metFORMIN (GLUCOPHAGE-XR) 500 MG 24 hr tablet Take 1 tablet (500 mg total) by mouth 2 (two) times daily. 11/11/19   [name], Henrine Screws T, NP  metoprolol succinate (TOPROL-XL) 50 MG 24 hr tablet Take 1 tablet (50 mg total) by mouth daily. Take with or immediately following a meal. 11/11/19   [name], Henrine Screws T, NP  omeprazole (PRILOSEC) 20 MG capsule Take 1 capsule (20 mg total) by mouth daily. 11/11/19   [name], Henrine Screws T, NP  rosuvastatin (CRESTOR) 40 MG tablet Take 1 tablet (40 mg total) by mouth daily. Stop Atorvastatin daily. 08/26/20   [name], Henrine Screws T, NP  vitamin C (ASCORBIC ACID) 250 MG tablet Take 2 tablets (500 mg total) by mouth daily. Patient taking differently: Take 250 mg by mouth daily. 06/14/19   Earlie Server, MD    Allergies Contrast media [iodinated diagnostic  agents], Penicillins, Sulfa antibiotics, Latex, Neosporin [neomycin-bacitracin zn-polymyx], and Other    Social History Social History   Tobacco Use  . Smoking status: Former Smoker    Packs/day: 1.00    Years: 15.00    Pack years: 15.00    Quit date: 11/22/1997    Years since quitting: 22.9  . Smokeless tobacco: Never Used  Vaping Use  . Vaping Use: Never used  Substance Use Topics  . Alcohol use: No  . Drug use: No    Review of Systems Patient denies headaches, rhinorrhea, blurry vision, numbness, shortness of breath, chest pain, edema, cough, abdominal pain, nausea, vomiting, diarrhea, dysuria, fevers, rashes or hallucinations unless otherwise stated above in HPI. ____________________________________________   PHYSICAL EXAM:  VITAL SIGNS: Vitals:   10/23/20 1346 10/23/20 1449  BP: (!) 144/68 140/65  Pulse: 74 69  Resp: 17 17  Temp: 98.6 F (37 C)   SpO2: 99% 100%    Constitutional: Alert and oriented.  Eyes: Conjunctivae are normal.  Head: Atraumatic. Nose: No congestion/rhinnorhea. Mouth/Throat: Mucous membranes are moist.   Neck: No stridor. Painless ROM.   Cardiovascular: Normal rate, regular rhythm. Grossly normal heart sounds.  Good peripheral circulation. Respiratory: Normal respiratory effort.  No retractions. Lungs CTAB. Gastrointestinal: Soft and nontender. No distention. No abdominal bruits. No CVA tenderness. Genitourinary:  Musculoskeletal: No lower extremity tenderness nor edema.  No joint effusions. Neurologic:  Normal speech and language. No gross focal neurologic deficits are appreciated. No facial droop Skin:  Skin is warm, dry and intact. No rash noted. Psychiatric: Mood and affect are normal. Speech and behavior are normal.  ____________________________________________   LABS (all labs ordered are listed, but only abnormal results are displayed)  Results for orders placed or performed during the hospital encounter of 10/23/20 (from the past 24 hour(s))  CBC     Status: Abnormal   Collection Time: 10/23/20  2:49 PM  Result Value Ref Range   WBC 5.6 4.0 - 10.5 K/uL   RBC 4.07 3.87 - 5.11 MIL/uL   Hemoglobin 9.9 (L) 12.0 - 15.0 g/dL   HCT 32.1 (L) 36.0 - 46.0 %   MCV 78.9 (L) 80.0 - 100.0 fL   MCH 24.3 (L) 26.0 - 34.0 pg   MCHC 30.8 30.0 - 36.0 g/dL   RDW 15.1 11.5 - 15.5 %   Platelets 218 150 - 400 K/uL   nRBC 0.0 0.0 - 0.2 %  Comprehensive metabolic panel     Status: Abnormal   Collection Time: 10/23/20  2:49 PM  Result Value Ref Range   Sodium 143 135 - 145 mmol/L   Potassium 3.4 (L) 3.5 - 5.1 mmol/L   Chloride 107 98 - 111 mmol/L   CO2 25 22 - 32 mmol/L   Glucose, Bld 147 (H) 70 - 99 mg/dL   BUN 43 (H) 8 - 23 mg/dL   Creatinine, Ser 3.56 (H) 0.44 - 1.00 mg/dL   Calcium 9.0 8.9 - 10.3 mg/dL   Total Protein 7.1 6.5 - 8.1 g/dL   Albumin 3.6 3.5 - 5.0 g/dL   AST 31 15 - 41 U/L   ALT 28 0 - 44 U/L   Alkaline Phosphatase 58 38 - 126 U/L   Total Bilirubin 0.7 0.3 - 1.2 mg/dL   GFR, Estimated 13 (L) >60 mL/min   Anion gap 11 5 - 15  CK     Status: Abnormal   Collection Time: 10/23/20  2:49 PM  Result Value  Ref Range  Total CK 652 (H) 38 - 234 U/L   ____________________________________________ ____________________________________________  RADIOLOGY  I personally reviewed all radiographic images ordered to evaluate for the above acute complaints and reviewed radiology reports and findings.  These findings were personally discussed with the patient.  Please see medical record for radiology report.  ____________________________________________   PROCEDURES  Procedure(s) performed:  Procedures    Critical Care performed: no ____________________________________________   INITIAL IMPRESSION / ASSESSMENT AND PLAN / ED COURSE  Pertinent labs & imaging results that were available during my care of the patient were reviewed by me and considered in my medical decision making (see chart for details).   DDX: Electrolyte abnormality, AKI, renal failure, obstructive uropathy, rhabdo  [facility] is a 80 y.o. who presents to the ED with presentation as described above.  Patient clinically well-appearing but with worsening renal dysfunction with repeat blood work today showing GFR of 13 concerning for kidney failure.  Electrolytes are normal.  Will give IV bolus as well as IV infusion.  Ultrasound ordered.  Repeat CK downtrending but still elevated.  Based on her age and sudden worsening of renal function will discuss with hospitalist for admission for IV hydration and reassessment.     The patient was evaluated in Emergency Department today for the symptoms described in the history of present illness. He/she was evaluated in the context of the global COVID-19 pandemic, which necessitated consideration that the patient might be at risk for infection with the SARS-CoV-2 virus that causes COVID-19. Institutional protocols and algorithms that pertain to the evaluation of patients at risk for COVID-19 are in a state of rapid change based on information released by regulatory bodies including the CDC  and federal and state organizations. These policies and algorithms were followed during the patient's care in the ED.  As part of my medical decision making, I reviewed the following data within the [facility] notes reviewed and incorporated, Labs reviewed, notes from prior ED visits and [place] Controlled Substance Database   ____________________________________________   FINAL CLINICAL IMPRESSION(S) / ED DIAGNOSES  Final diagnoses:  AKI (acute kidney injury) ([place])      NEW MEDICATIONS STARTED DURING THIS VISIT:  New Prescriptions   No medications on file     Note:  This document was prepared using Dragon voice recognition software and may include unintentional dictation errors.    Merlyn Lot, MD 10/23/20 [id number]

## 2020-10-23 NOTE — ED Notes (Signed)
New urine specimen sent to lab. First one was dirty. Educated pt on how to get clean catch.  Resent.

## 2020-10-24 ENCOUNTER — Ambulatory Visit: Admission: RE | Admit: 2020-10-24 | Payer: Medicare Other | Source: Ambulatory Visit

## 2020-10-24 ENCOUNTER — Other Ambulatory Visit: Payer: Self-pay | Admitting: Nurse Practitioner

## 2020-10-24 ENCOUNTER — Encounter: Payer: Self-pay | Admitting: Internal Medicine

## 2020-10-24 DIAGNOSIS — N179 Acute kidney failure, unspecified: Secondary | ICD-10-CM | POA: Diagnosis not present

## 2020-10-24 LAB — HEMOGLOBIN A1C
Hgb A1c MFr Bld: 7.8 % — ABNORMAL HIGH (ref 4.8–5.6)
Mean Plasma Glucose: 177.16 mg/dL

## 2020-10-24 LAB — GLUCOSE, CAPILLARY
Glucose-Capillary: 179 mg/dL — ABNORMAL HIGH (ref 70–99)
Glucose-Capillary: 95 mg/dL (ref 70–99)
Glucose-Capillary: 126 mg/dL — ABNORMAL HIGH (ref 70–99)

## 2020-10-24 LAB — COMPREHENSIVE METABOLIC PANEL
ALT: 22 U/L (ref 0–44)
AST: 23 U/L (ref 15–41)
Albumin: 3.2 g/dL — ABNORMAL LOW (ref 3.5–5.0)
Alkaline Phosphatase: 54 U/L (ref 38–126)
Anion gap: 8 (ref 5–15)
BUN: 38 mg/dL — ABNORMAL HIGH (ref 8–23)
CO2: 25 mmol/L (ref 22–32)
Calcium: 8.3 mg/dL — ABNORMAL LOW (ref 8.9–10.3)
Chloride: 109 mmol/L (ref 98–111)
Creatinine, Ser: 2.98 mg/dL — ABNORMAL HIGH (ref 0.44–1.00)
GFR, Estimated: 16 mL/min — ABNORMAL LOW (ref >60)
Glucose, Bld: 93 mg/dL (ref 70–99)
Potassium: 3.1 mmol/L — ABNORMAL LOW (ref 3.5–5.1)
Sodium: 142 mmol/L (ref 135–145)
Total Bilirubin: 0.5 mg/dL (ref 0.3–1.2)
Total Protein: 6 g/dL — ABNORMAL LOW (ref 6.5–8.1)

## 2020-10-24 LAB — CBC
HCT: 28.7 % — ABNORMAL LOW (ref 36.0–46.0)
Hemoglobin: 8.9 g/dL — ABNORMAL LOW (ref 12.0–15.0)
MCH: 24.2 pg — ABNORMAL LOW (ref 26.0–34.0)
MCHC: 31 g/dL (ref 30.0–36.0)
MCV: 78 fL — ABNORMAL LOW (ref 80.0–100.0)
Platelets: 206 10*3/uL (ref 150–400)
RBC: 3.68 MIL/uL — ABNORMAL LOW (ref 3.87–5.11)
RDW: 15 % (ref 11.5–15.5)
WBC: 5.6 10*3/uL (ref 4.0–10.5)
nRBC: 0 % (ref 0.0–0.2)

## 2020-10-24 LAB — MAGNESIUM: Magnesium: 1.5 mg/dL — ABNORMAL LOW (ref 1.7–2.4)

## 2020-10-24 LAB — PROTEIN / CREATININE RATIO, URINE
Creatinine, Urine: 28 mg/dL
Protein Creatinine Ratio: 0.93 mg/mg{Cre} — ABNORMAL HIGH (ref 0.00–0.15)
Total Protein, Urine: 26 mg/dL

## 2020-10-24 LAB — SARS CORONAVIRUS 2 (TAT 6-24 HRS): SARS Coronavirus 2: NEGATIVE

## 2020-10-24 MED ORDER — CLONIDINE HCL 0.1 MG PO TABS
0.1000 mg | ORAL_TABLET | Freq: Two times a day (BID) | ORAL | Status: DC
  Administered 2020-10-24 – 2020-10-27 (×6): 0.1 mg via ORAL
  Filled 2020-10-24 (×6): qty 1

## 2020-10-24 MED ORDER — POTASSIUM CHLORIDE CRYS ER 20 MEQ PO TBCR
40.0000 meq | EXTENDED_RELEASE_TABLET | Freq: Once | ORAL | Status: AC
  Administered 2020-10-24: 40 meq via ORAL
  Filled 2020-10-24: qty 2

## 2020-10-24 MED ORDER — MAGNESIUM SULFATE 2 GM/50ML IV SOLN
2.0000 g | Freq: Once | INTRAVENOUS | Status: AC
  Administered 2020-10-24: 2 g via INTRAVENOUS
  Filled 2020-10-24: qty 50

## 2020-10-24 MED ORDER — PANTOPRAZOLE SODIUM 40 MG PO TBEC
40.0000 mg | DELAYED_RELEASE_TABLET | Freq: Every day | ORAL | Status: DC
  Administered 2020-10-25 – 2020-10-27 (×3): 40 mg via ORAL
  Filled 2020-10-24 (×3): qty 1

## 2020-10-24 MED ORDER — MAGNESIUM CHLORIDE 64 MG PO TBEC
1.0000 | DELAYED_RELEASE_TABLET | Freq: Two times a day (BID) | ORAL | Status: DC
  Administered 2020-10-24 – 2020-10-27 (×6): 64 mg via ORAL
  Filled 2020-10-24 (×7): qty 1

## 2020-10-24 MED ORDER — LACTATED RINGERS IV SOLN: INTRAVENOUS | Status: AC

## 2020-10-24 MED ORDER — FERROUS SULFATE 325 (65 FE) MG PO TABS
325.0000 mg | ORAL_TABLET | Freq: Two times a day (BID) | ORAL | Status: DC
  Administered 2020-10-25 – 2020-10-27 (×5): 325 mg via ORAL
  Filled 2020-10-24 (×5): qty 1

## 2020-10-24 NOTE — Consult Note (Addendum)
[facility]  CONSULT NOTE    Date: 10/24/2020                  Patient Name:  Tina Mcdonald  MRN: [id number]  DOB: 1941/05/12  Age / Sex: 80 y.o., female         PCP: [name], NP                 Service Requesting Consult: TRh                 Reason for Consult: Acute Kidney Injury            History of Present Illness: Tina Mcdonald is a 80 y.o.  female with , who was admitted to [facility] on 10/23/2020 for acute kidney injury. 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 Patient received a call from primary care physician about abnormal labs and was told to come to ED.   Patient states she has felt fine. Denies nausea and vomiting, States she has occasional diarrhea. Denies fatigue, weakness and loss of appetite. Says she has joint pains usual for her age. Newest medication is for overactive bladder. Patient states she takes Aleve periodically for back pain, but not often. Less than weekly. Was recently taken off HCTZ. Minimal family history of kidney disease. Denies shortness of breath. Says she's had back pain for years. States she gets rashes periodically. Family history of Lupus.    Medications: Outpatient medications: Facility-Administered Medications Prior to Admission  Medication Dose Route Frequency Provider Last Rate Last Admin  . cyanocobalamin ((VITAMIN B-12)) injection 1,000 mcg  1,000 mcg Intramuscular Q30 days Kathrine Haddock, NP   1,000 mcg at 08/17/20 1022   Medications Prior to Admission  Medication Sig Dispense Refill Last Dose  . acetaminophen (TYLENOL) 325 MG tablet Take 650 mg by mouth every 6 (six) hours as needed.   prn at prn  . cloNIDine (CATAPRES) 0.1 MG tablet Take 0.1 mg by mouth 2 (two) times daily.   10/23/2020 at Unknown time  . diclofenac sodium (VOLTAREN) 1 % GEL Apply 2 g topically 2 (two) times daily as needed. 1 Tube 2 prn at prn  .  Ferrous Sulfate (SLOW FE PO) Take 1 tablet by mouth daily.   10/23/2020 at Unknown time  . gabapentin (NEURONTIN) 100 MG capsule Take 3 capsules (300 mg total) by mouth at bedtime. (Patient taking differently: Take 200 mg by mouth at bedtime.) 270 capsule 4 10/22/2020 at Unknown time  . magnesium chloride (SLOW-MAG) 64 MG TBEC SR tablet Take 1 tablet (64 mg total) by mouth 2 (two) times daily. 60 tablet 4 10/23/2020 at Unknown time  . metFORMIN (GLUCOPHAGE-XR) 500 MG 24 hr tablet Take 1 tablet (500 mg total) by mouth 2 (two) times daily. 180 tablet 4 10/23/2020 at Unknown time  . metoprolol succinate (TOPROL-XL) 50 MG 24 hr tablet Take 1 tablet (50 mg total) by mouth daily. Take with or immediately following a meal. 90 tablet 4 10/23/2020 at Unknown time  . mirabegron ER (MYRBETRIQ) 50 MG TB24 tablet Take 50 mg by mouth daily.   10/23/2020 at Unknown time  . omeprazole (PRILOSEC) 20 MG capsule Take 1 capsule (20 mg total) by mouth daily. 90 capsule 4 10/23/2020 at Unknown time  . rosuvastatin (CRESTOR) 40 MG tablet Take 1 tablet (40 mg total) by mouth daily. Stop Atorvastatin daily. 90 tablet 4 10/23/2020  at Unknown time  . vitamin C (ASCORBIC ACID) 250 MG tablet Take 2 tablets (500 mg total) by mouth daily. (Patient taking differently: Take 250 mg by mouth daily.) 60 tablet 2 10/23/2020 at Unknown time  . blood glucose meter kit and supplies KIT Dispense based on patient and insurance preference. Use up to four times daily as directed. (FOR ICD-9 250.00, 250.01). 1 each 0   . ciclopirox (PENLAC) 8 % solution Apply topically at bedtime. Apply over nail and surrounding skin. Apply daily over previous coat. After seven (7) days, may remove with alcohol and continue cycle. (Patient not taking: No sig reported) 6.6 mL 1 Completed Course at Unknown time  . glucose blood test strip Check daily. ICD 10 E11.9 100 each 12     Current medications: Current Facility-Administered Medications  Medication Dose Route Frequency  Provider Last Rate Last Admin  . acetaminophen (TYLENOL) tablet 650 mg  650 mg Oral Q6H PRN Vashti Hey, MD       Or  . acetaminophen (TYLENOL) suppository 650 mg  650 mg Rectal Q6H PRN Bonnell Public Tublu, MD      . heparin injection 5,000 Units  5,000 Units Subcutaneous Q8H Vashti Hey, MD   5,000 Units at 10/24/20 [phone]  . insulin aspart (novoLOG) injection 0-6 Units  0-6 Units Subcutaneous TID WC Chatterjee, Dewaine Oats Tublu, MD      . potassium chloride SA (KLOR-CON) CR tablet 40 mEq  40 mEq Oral Once Lorella Nimrod, MD          Allergies: Allergies  Allergen Reactions  . Contrast Media [Iodinated Diagnostic Agents] Itching  . Penicillins Itching    Has patient had a PCN reaction causing immediate rash, facial/tongue/throat swelling, SOB or lightheadedness with hypotension: Yes Has patient had a PCN reaction causing severe rash involving mucus membranes or skin necrosis: Unknown Has patient had a PCN reaction that required hospitalization: Yes Has patient had a PCN reaction occurring within the last 10 years: Yes If all of the above answers are "NO", then may proceed with Cephalosporin use.  . Sulfa Antibiotics Itching  . Latex Rash  . Neosporin [Neomycin-Bacitracin Zn-Polymyx] Itching and Rash  . Other Rash    Pt has reaction to high acidity foods.       Past Medical History: Past Medical History:  Diagnosis Date  . Anemia   . Arthritis   . Chronic kidney disease   . Colon polyps   . Diabetes mellitus without complication ([place])   . Diabetic neuropathy ([place])   . Dyspnea   . GERD (gastroesophageal reflux disease)   . Hyperlipemia   . Hypertension   . Osteoarthritis   . Radiculopathy    weakness in right leg  . Sleep apnea    does not have her cpap anymore  . Stress incontinence   . Urticaria   . Vitamin B 12 deficiency   . Wears glasses      Past Surgical History: Past Surgical History:  Procedure Laterality Date  . ABDOMINAL  HYSTERECTOMY    . APPENDECTOMY    . BREAST BIOPSY Right    right-neg  . BREAST BIOPSY Left    us/bx/clip-neg  . CARPAL TUNNEL RELEASE     right  . COLONOSCOPY    . COLONOSCOPY WITH PROPOFOL N/A 12/10/2015   Procedure: COLONOSCOPY WITH PROPOFOL;  Surgeon: Lucilla Lame, MD;  Location: [place];  Service: Endoscopy;  Laterality: N/A;  Diabetic - oral meds Sleep Apnea LATEX allergy  .  COLONOSCOPY WITH PROPOFOL N/A 01/17/2019   Procedure: COLONOSCOPY WITH PROPOFOL;  Surgeon: Virgel Manifold, MD;  Location: [place];  Service: Endoscopy;  Laterality: N/A;  . CYSTOSCOPY    . DILATION AND CURETTAGE OF UTERUS    . ESOPHAGOGASTRODUODENOSCOPY (EGD) WITH PROPOFOL N/A 01/17/2019   Procedure: ESOPHAGOGASTRODUODENOSCOPY (EGD) WITH PROPOFOL;  Surgeon: Virgel Manifold, MD;  Location: [place];  Service: Endoscopy;  Laterality: N/A;  . EYE SURGERY Bilateral    lens implant  . GIVENS CAPSULE STUDY N/A 05/04/2019   Procedure: GIVENS CAPSULE STUDY;  Surgeon: Virgel Manifold, MD;  Location: [facility];  Service: Endoscopy;  Laterality: N/A;  . LUMBAR FUSION  2009  . POLYPECTOMY  12/10/2015   Procedure: POLYPECTOMY;  Surgeon: Lucilla Lame, MD;  Location: [place];  Service: Endoscopy;;  . POLYPECTOMY  01/17/2019   Procedure: POLYPECTOMY;  Surgeon: Virgel Manifold, MD;  Location: [place];  Service: Endoscopy;;  . RESECTION DISTAL CLAVICAL Right 11/25/2013   Procedure: RIGHT SHOULDER OPEN RESECTION DISTAL CLAVICAL EXCISION SOFT TISSUE TUMOR SHOULDER DEEP SUBFASCIAL INTRAMUSCULAR/DEBRIDEMENT/ROTATOR CUFF REPAIR/BICEPS TENODESIS/MANIPULATION;  Surgeon: Renette Butters, MD;  Location: [place];  Service: Orthopedics;  Laterality: Right;  . SHOULDER ARTHROSCOPY WITH ROTATOR CUFF REPAIR AND SUBACROMIAL DECOMPRESSION Right 11/25/2013   Procedure: SHOULDER ARTHROSCOPY WITH DEBRIDEMENT ROTATOR CUFF REPAIR  A;  Surgeon: Renette Butters, MD;  Location: [place];  Service: Orthopedics;  Laterality: Right;     Family History: Family History  Problem Relation Age of Onset  . Heart disease Mother   . Diabetes Mother   . Hypertension Mother   . Heart disease Father   . Hypertension Father   . Diabetes Sister   . Hypertension Sister   . Heart disease Sister   . Kidney disease Sister   . Diabetes Sister   . Hypertension Sister   . Breast cancer Cousin 26  . Non-Hodgkin's lymphoma Maternal Uncle      Social History: Social History   Socioeconomic History  . Marital status: Single    Spouse name: Not on file  . Number of children: Not on file  . Years of education: Not on file  . Highest education level: Some college, no degree  Occupational History  . Occupation: retired  Tobacco Use  . Smoking status: Former Smoker    Packs/day: 1.00    Years: 15.00    Pack years: 15.00    Quit date: 11/22/1997    Years since quitting: 22.9  . Smokeless tobacco: Never Used  Vaping Use  . Vaping Use: Never used  Substance and Sexual Activity  . Alcohol use: No  . Drug use: No  . Sexual activity: Not Currently  Other Topics Concern  . Not on file  Social History Narrative  . Not on file   Social Determinants of Health   Financial Resource Strain: Low Risk   . Difficulty of Paying Living Expenses: Not hard at all  Food Insecurity: No Food Insecurity  . Worried About Charity fundraiser in the Last Year: Never true  . Ran Out of Food in the Last Year: Never true  Transportation Needs: No Transportation Needs  . Lack of Transportation (Medical): No  . Lack of Transportation (Non-Medical): No  Physical Activity: Inactive  . Days of Exercise per Week: 0 days  . Minutes of Exercise per Session: 0 min  Stress: No Stress Concern Present  . Feeling of Stress : Not  at all  Social Connections: Unknown  . Frequency of Communication with Friends and Family: More than three times a week  .  Frequency of Social Gatherings with Friends and Family: More than three times a week  . Attends Religious Services: More than 4 times per year  . Active Member of Clubs or Organizations: Yes  . Attends Archivist Meetings: More than 4 times per year  . Marital Status: Not on file  Intimate Partner Violence: Not At Risk  . Fear of Current or Ex-Partner: No  . Emotionally Abused: No  . Physically Abused: No  . Sexually Abused: No     Review of Systems: Review of Systems  Constitutional: Negative.   HENT: Negative.   Eyes: Negative.   Respiratory: Negative.   Cardiovascular: Negative.   Gastrointestinal: Positive for diarrhea.  Genitourinary: Negative.   Musculoskeletal: Positive for back pain.  Skin: Positive for rash.  Neurological: Negative.   Endo/Heme/Allergies: Negative.   Psychiatric/Behavioral: Negative.     Vital Signs: Blood pressure (!) 131/58, pulse 73, temperature 98.3 F (36.8 C), resp. rate 18, height _0  (1.575 m), weight 70.3 kg, SpO2 100 %.  Weight trends: Filed Weights   10/23/20 1348 10/23/20 1352  Weight: 70.3 kg 70.3 kg    Physical Exam: General: NAD, laying in bed  Head: Normocephalic, atraumatic. Moist oral mucosal membranes  Eyes: Anicteric  Lungs:  Clear to auscultation, normal effort  Heart: Regular rate and rhythm  Abdomen:  Soft, nontender,   Extremities:  no peripheral edema.  Neurologic: Nonfocal, moving all four extremities  Skin: No lesions        Lab results: Basic Metabolic Panel: Recent Labs  Lab 10/18/20 1427 10/22/20 1452 10/23/20 1449 10/24/20 0458  NA 143 141 143 142  K 3.3* 3.5 3.4* 3.1*  CL 100 101 107 109  CO2 _1 GLUCOSE 200* 219* 147* 93  BUN 38* 44* 43* 38*  CREATININE 3.03* 3.45* 3.56* 2.98*  CALCIUM 9.2 9.2 9.0 8.3*  MG 1.6  --   --  1.5*    Liver Function Tests: Recent Labs  Lab 10/22/20 1452 10/23/20 1449 10/24/20 0458  AST _2 ALT _3 ALKPHOS 75 58 54   BILITOT <0.2 0.7 0.5  PROT 6.5 7.1 6.0*  ALBUMIN 4.0 3.6 3.2*   No results for input(s): LIPASE, AMYLASE in the last 168 hours. No results for input(s): AMMONIA in the last 168 hours.  CBC: Recent Labs  Lab 10/18/20 1427 10/22/20 1452 10/23/20 1449 10/24/20 0458  WBC 5.9 6.5 5.6 5.6  NEUTROABS 3.4 4.0  --   --   HGB 9.9* 9.8* 9.9* 8.9*  HCT 31.9* 30.9* 32.1* 28.7*  MCV 77* 77* 78.9* 78.0*  PLT 238 233 218 206    Cardiac Enzymes: Recent Labs  Lab 10/22/20 1452 10/23/20 1449  CKTOTAL 770* 652*    BNP: Invalid input(s): POCBNP  CBG: No results for input(s): GLUCAP in the last 168 hours.  Microbiology: Results for orders placed or performed during the hospital encounter of 10/23/20  SARS CORONAVIRUS 2 (TAT 6-24 HRS) Nasopharyngeal Nasopharyngeal Swab     Status: None   Collection Time: 10/23/20  4:02 PM   Specimen: Nasopharyngeal Swab  Result Value Ref Range Status   SARS Coronavirus 2 NEGATIVE NEGATIVE Final    Comment: (NOTE) SARS-CoV-2 target nucleic acids are NOT DETECTED.  The SARS-CoV-2 RNA is generally detectable in upper and lower respiratory specimens during  the acute phase of infection. Negative results do not preclude SARS-CoV-2 infection, do not rule out co-infections with other pathogens, and should not be used as the sole basis for treatment or other patient management decisions. Negative results must be combined with clinical observations, patient history, and epidemiological information. The expected result is Negative.  Fact Sheet for Patients: SugarRoll.be  Fact Sheet for Healthcare Providers: https://www.woods-mathews.com/  This test is not yet approved or cleared by the [place] FDA and  has been authorized for detection and/or diagnosis of SARS-CoV-2 by FDA under an Emergency Use Authorization (EUA). This EUA will remain  in effect (meaning this test can be used) for the duration of  the COVID-19 declaration under Se ction 564(b)(1) of the Act, 21 U.S.C. section 360bbb-3(b)(1), unless the authorization is terminated or revoked sooner.  Performed at [facility], [place] [address] [place], [place] [address]     Coagulation Studies: No results for input(s): LABPROT, INR in the last 72 hours.  Urinalysis: Recent Labs    10/22/20 1447 10/23/20 1621  COLORURINE  --  YELLOW*  LABSPEC  --  1.011  PHURINE  --  5.0  GLUCOSEU Negative NEGATIVE  HGBUR  --  SMALL*  BILIRUBINUR Negative NEGATIVE  KETONESUR  --  NEGATIVE  PROTEINUR 2+* 30*  NITRITE Negative NEGATIVE  LEUKOCYTESUR Negative NEGATIVE      Imaging: US Renal  Result Date: 10/23/2020 CLINICAL DATA:  Acute kidney injury EXAM: RENAL / URINARY TRACT ULTRASOUND COMPLETE COMPARISON:  03/23/2015, CT 02/05/2017 FINDINGS: Right Kidney: Renal measurements: 10.7 x 4 x 5.1 cm = volume: 112 mL. Cortex is slightly echogenic. No hydronephrosis or mass. Left Kidney: Renal measurements: 10.7 x 5 x 4.5 cm = volume: 125 mL. Cortex is slightly echogenic. No hydronephrosis or mass. Bladder: Appears normal for degree of bladder distention. Other: None. IMPRESSION: 1. Echogenic kidneys bilaterally consistent with medical renal disease. 2. Negative for hydronephrosis Electronically Signed   By: Donavan Foil M.D.   On: 10/23/2020 17:04      Assessment & Plan: Tina Mcdonald is a 80 y.o.  female with , who was admitted to [facility] on 10/23/2020 for Acute kidney injury. 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   1. Acute Kidney Injury on chronic kidney disease with a baseline 0.91 on 08/24/20. Acute kidney injury of unknown origin Renal ultrasound- consistent with renal disease, negative for obstruction No IV contrast exposure No acute need for dialysis at this time UPEP, SPEP, ANCA and other serotologies ordered Avoid nephrotoxic agents  NSAIDs, IV contrast, diuretics, ACEs Gentle IV hydration Will monitor renal recovery in setting of conservative management  2. 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 Home regimen includes Metformin.  Most recent hemoglobin A1c is 7.8 on 10/23/20.  Metformin held at this time Primary team will manage SSi      LOS: 1 [place] 5/4/202211:25 AM

## 2020-10-24 NOTE — Telephone Encounter (Signed)
Requested Prescriptions  Pending Prescriptions Disp Refills  . omeprazole (PRILOSEC) 20 MG capsule [Pharmacy Med Name: Omeprazole 20 MG Oral Capsule Delayed Release] 90 capsule 3    Sig: TAKE 1 CAPSULE BY MOUTH  DAILY     Gastroenterology: Proton Pump Inhibitors Passed - 10/24/2020 10:16 PM      Passed - Valid encounter within last 12 months    Recent Outpatient Visits          6 days ago Fatigue, unspecified type   [place], Lauren A, NP   2 months ago Other diabetic neurological complication associated with type 2 diabetes mellitus ([place])   [place] [name], Jolene T, NP   6 months ago CKD stage 3 due to type 2 diabetes mellitus ([place])   [place] [name], Jolene T, NP   8 months ago Diarrhea, unspecified type   [facility] [place], Jolene T, NP   9 months ago Diarrhea, unspecified type   [facility] [place], [name], NP      Future Appointments            In 2 weeks Mac[place], Nicki Reaper, MD [place]   In 1 month [place], [name], NP [facility]   In 5 months  [facility]

## 2020-10-24 NOTE — Progress Notes (Addendum)
PROGRESS NOTE    Tina Mcdonald  DDU:[id number] DOB: [birth year] DOA: 10/23/2020 PCP: [name], NP   Brief Narrative: Taken from H&P. Tina Mcdonald is an 80 y.o. 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  PCP attempted to contact patient to ask her to go the ED however she was out of town.  She did go to the ED but left before she could be seen last week.  Patient notes the only medication changes she has had has been per her PCP which have included addition of oxybutynin, holding of metformin and HCTZ and gabapentin and changing of her statins due to intermittent fatigue. Renal ultrasound consistent with medical renal disease, no obstruction. CK elevated at 770>>652 Nephrology was consulted.  Subjective: Patient was seen and examined today.  No new complaint.  Did not had any prior renal history.  Denies any recent immobilization, falls, seizure.  Assessment & Plan:   Principal Problem:   AKI (acute kidney injury) ([place]) Active Problems:   Type 2 diabetes mellitus with chronic kidney disease ([place])   Hypertension associated with diabetes ([place])   Hyperlipidemia associated with type 2 diabetes mellitus ([place])   Diabetic neuropathy ([place])  AKI with CKD stage II.  Nephrology was consulted and multiple labs pending. Elevated CK-no other concerning history for rhabdomyolysis but patient was on Crestor. Renal ultrasound with medical renal disease. -Keep holding HCTZ and metformin. -Continue with IV fluid. -Monitor renal function -Avoid nephrotoxins  Elevated CK.  Might be responsible for AKI.  Patient was on Crestor otherwise denies any significant muscle use or injuries. -Monitor CK. -IV fluid -Hold Crestor  Type 2 diabetes mellitus. -Keep holding  metformin -SSI  Dyslipidemia. -Keep holding statin for elevated CK levels.  Hypokalemia/hypomagnesemia. -Replete electrolyte and monitor.  Objective: Vitals:   10/24/20 0416 10/24/20 0857 10/24/20 1232 10/24/20 1546  BP: 133/61 (!) 131/58 (!) 164/72 (!) 138/47  Pulse: 76 73 77 71  Resp: 18 18 16 18   Temp: 98.7 F (37.1 C) 98.3 F (36.8 C) 97.7 F (36.5 C) 98.2 F (36.8 C)  TempSrc:      SpO2: 99% 100% 100% 100%  Weight:      Height:        Intake/Output Summary (Last 24 hours) at 10/24/2020 1807 Last data filed at 10/24/2020 1025 Gross per 24 hour  Intake 100 ml  Output --  Net 100 ml   Filed Weights   10/23/20 1348 10/23/20 1352  Weight: 70.3 kg 70.3 kg    Examination:  General exam: Appears calm and comfortable  Respiratory system: Clear to auscultation. Respiratory effort normal. Cardiovascular system: S1 & S2 heard, RRR. No JVD, murmurs, rubs, gallops or clicks. Gastrointestinal system: Soft, nontender, nondistended, bowel sounds positive. Central nervous system: Alert and oriented. No focal neurological deficits.Symmetric 5 x 5 power. Extremities: No edema, no cyanosis, pulses intact and symmetrical. Psychiatry: Judgement and insight appear normal. Mood & affect appropriate.    DVT prophylaxis: Heparin Code Status: Full Family Communication: Daughter at bedside Disposition Plan:  Status is: Inpatient  Remains inpatient appropriate because:Inpatient level of care appropriate due to severity of illness   Dispo: The patient is from: Home              Anticipated d/c is  to: Home              Patient currently is not medically stable to d/c.   Difficult to place patient No              Level of care: Med-Surg  All the records are reviewed and case discussed with Care Management/Social Worker. Management plans discussed with the patient, nursing and they are in agreement.  Consultants:   Nephrology.  Procedures:  Antimicrobials:   Data Reviewed: I  have personally reviewed following labs and imaging studies  CBC: Recent Labs  Lab 10/18/20 1427 10/22/20 1452 10/23/20 1449 10/24/20 0458  WBC 5.9 6.5 5.6 5.6  NEUTROABS 3.4 4.0  --   --   HGB 9.9* 9.8* 9.9* 8.9*  HCT 31.9* 30.9* 32.1* 28.7*  MCV 77* 77* 78.9* 78.0*  PLT 238 233 218 737   Basic Metabolic Panel: Recent Labs  Lab 10/18/20 1427 10/22/20 1452 10/23/20 1449 10/24/20 0458  NA 143 141 143 142  K 3.3* 3.5 3.4* 3.1*  CL 100 101 107 109  CO2 24 22 25 25   GLUCOSE 200* 219* 147* 93  BUN 38* 44* 43* 38*  CREATININE 3.03* 3.45* 3.56* 2.98*  CALCIUM 9.2 9.2 9.0 8.3*  MG 1.6  --   --  1.5*   GFR: Estimated Creatinine Clearance: 14.3 mL/min (A) (by C-G formula based on SCr of 2.98 mg/dL (H)). Liver Function Tests: Recent Labs  Lab 10/18/20 1427 10/22/20 1452 10/23/20 1449 10/24/20 0458  AST 22 30 31 23   ALT 28 31 28 22   ALKPHOS 76 75 58 54  BILITOT <0.2 <0.2 0.7 0.5  PROT 6.8 6.5 7.1 6.0*  ALBUMIN 4.1 4.0 3.6 3.2*   No results for input(s): LIPASE, AMYLASE in the last 168 hours. No results for input(s): AMMONIA in the last 168 hours. Coagulation Profile: No results for input(s): INR, PROTIME in the last 168 hours. Cardiac Enzymes: Recent Labs  Lab 10/22/20 1452 10/23/20 1449  CKTOTAL 770* 652*   BNP (last 3 results) No results for input(s): PROBNP in the last 8760 hours. HbA1C: Recent Labs    10/23/20 1449  HGBA1C 7.8*   CBG: Recent Labs  Lab 10/24/20 1219 10/24/20 1626  GLUCAP 179* 95   Lipid Profile: No results for input(s): CHOL, HDL, LDLCALC, TRIG, CHOLHDL, LDLDIRECT in the last 72 hours. Thyroid Function Tests: No results for input(s): TSH, T4TOTAL, FREET4, T3FREE, THYROIDAB in the last 72 hours. Anemia Panel: No results for input(s): VITAMINB12, FOLATE, FERRITIN, TIBC, IRON, RETICCTPCT in the last 72 hours. Sepsis Labs: No results for input(s): PROCALCITON, LATICACIDVEN in the last 168 hours.  Recent Results (from the past 240  hour(s))  Microscopic Examination     Status: Abnormal   Collection Time: 10/22/20  2:47 PM   Urine  Result Value Ref Range Status   WBC, UA None seen 0 - 5 /hpf Final   RBC 0-2 0 - 2 /hpf Final   Epithelial Cells (non renal) 0-10 0 - 10 /hpf Final   Casts Present (A) None seen /lpf Final   Cast Type Granular casts (A) N/A Final   Crystals Present (A) N/A Final   Crystal Type Amorphous Sediment N/A Final   Mucus, UA Present (A) Not Estab. Final   Bacteria, UA Few (A) None seen/Few Final  SARS CORONAVIRUS 2 (TAT 6-24 HRS) Nasopharyngeal Nasopharyngeal Swab     Status: None   Collection Time: 10/23/20  4:02 PM   Specimen: Nasopharyngeal Swab  Result Value Ref Range Status   SARS Coronavirus 2 NEGATIVE NEGATIVE Final    Comment: (NOTE) SARS-CoV-2 target nucleic acids are NOT DETECTED.  The SARS-CoV-2 RNA is generally detectable in upper and lower respiratory specimens during the acute phase of infection. Negative results do not preclude SARS-CoV-2 infection, do not rule out co-infections with other pathogens, and should not be used as the sole basis for treatment or other patient management decisions. Negative results must be combined with clinical observations, patient history, and epidemiological information. The expected result is Negative.  Fact Sheet for Patients: SugarRoll.be  Fact Sheet for Healthcare Providers: https://www.woods-mathews.com/  This test is not yet approved or cleared by the [place] FDA and  has been authorized for detection and/or diagnosis of SARS-CoV-2 by FDA under an Emergency Use Authorization (EUA). This EUA will remain  in effect (meaning this test can be used) for the duration of the COVID-19 declaration under Se ction 564(b)(1) of the Act, 21 U.S.C. section 360bbb-3(b)(1), unless the authorization is terminated or revoked sooner.  Performed at [facility], [place] [address], [place],  [place] [address]      Radiology Studies: US Renal  Result Date: 10/23/2020 CLINICAL DATA:  Acute kidney injury EXAM: RENAL / URINARY TRACT ULTRASOUND COMPLETE COMPARISON:  03/23/2015, CT 02/05/2017 FINDINGS: Right Kidney: Renal measurements: 10.7 x 4 x 5.1 cm = volume: 112 mL. Cortex is slightly echogenic. No hydronephrosis or mass. Left Kidney: Renal measurements: 10.7 x 5 x 4.5 cm = volume: 125 mL. Cortex is slightly echogenic. No hydronephrosis or mass. Bladder: Appears normal for degree of bladder distention. Other: None. IMPRESSION: 1. Echogenic kidneys bilaterally consistent with medical renal disease. 2. Negative for hydronephrosis Electronically Signed   By: Donavan Foil M.D.   On: 10/23/2020 17:04    Scheduled Meds: . heparin  5,000 Units Subcutaneous Q8H  . insulin aspart  0-6 Units Subcutaneous TID WC   Continuous Infusions:   LOS: 1 day   Time spent: 40 minutes. More than 50% of the time was spent in counseling/coordination of care  Lorella Nimrod, MD [facility]  If 7PM-7AM, please contact night-coverage [email or web address]  10/24/2020, 6:07 PM   This record has been created using Systems analyst. Errors have been sought and corrected,but may not always be located. Such creation errors do not reflect on the standard of care.

## 2020-10-24 NOTE — Progress Notes (Signed)
Nutrition Brief Note  Patient identified on the Malnutrition Screening Tool (MST) Report  Wt Readings  10/23/20 70.3 kg  10/18/20 71.1 kg  08/24/20 70.4 kg  06/08/20 73.6 kg  04/13/20 71.8 kg  03/26/20 70.8 kg  02/07/20 72.6 kg  12/02/19 74.8 kg  09/24/19 73.9 kg    Body mass index is 28.35 kg/m. Patient meets criteria for overweight based on current BMI.   Spoke with pt at bedside. States she is feeling well today and eating normally. Very complimentary of her breakfast. States she has never had a poor appetite PTA. States weight has been stable. Notes some weight loss over the years, but nothing acute.  Current diet order is renal with 2L fluid restriction, patient is consuming approximately 100% of meals at this time. K was low this AM, will recommend adjusting to carb modified as there is no indication for renal modifier at this time. Labs and medications reviewed.   No nutrition interventions warranted at this time. If nutrition issues arise, please consult RD.   Ranell Patrick, RD, LDN Clinical Dietitian Pager on [place]

## 2020-10-24 NOTE — Plan of Care (Signed)
Patient will have knowledge of foods and drinks to intake with DM

## 2020-10-25 ENCOUNTER — Ambulatory Visit: Payer: Medicare Other | Admitting: Nurse Practitioner

## 2020-10-25 ENCOUNTER — Encounter: Payer: Self-pay | Admitting: Internal Medicine

## 2020-10-25 DIAGNOSIS — K219 Gastro-esophageal reflux disease without esophagitis: Secondary | ICD-10-CM

## 2020-10-25 DIAGNOSIS — N179 Acute kidney failure, unspecified: Secondary | ICD-10-CM | POA: Diagnosis not present

## 2020-10-25 DIAGNOSIS — E1159 Type 2 diabetes mellitus with other circulatory complications: Secondary | ICD-10-CM

## 2020-10-25 DIAGNOSIS — N1831 Chronic kidney disease, stage 3a: Secondary | ICD-10-CM

## 2020-10-25 DIAGNOSIS — E785 Hyperlipidemia, unspecified: Secondary | ICD-10-CM

## 2020-10-25 DIAGNOSIS — E1169 Type 2 diabetes mellitus with other specified complication: Secondary | ICD-10-CM | POA: Diagnosis not present

## 2020-10-25 DIAGNOSIS — E1122 Type 2 diabetes mellitus with diabetic chronic kidney disease: Secondary | ICD-10-CM

## 2020-10-25 DIAGNOSIS — I152 Hypertension secondary to endocrine disorders: Secondary | ICD-10-CM

## 2020-10-25 LAB — IRON AND TIBC
Iron: 53 ug/dL (ref 28–170)
Saturation Ratios: 22 % (ref 10.4–31.8)
TIBC: 241 ug/dL — ABNORMAL LOW (ref 250–450)
UIBC: 188 ug/dL

## 2020-10-25 LAB — CBC
HCT: 29.7 % — ABNORMAL LOW (ref 36.0–46.0)
Hemoglobin: 9.3 g/dL — ABNORMAL LOW (ref 12.0–15.0)
MCH: 24.5 pg — ABNORMAL LOW (ref 26.0–34.0)
MCHC: 31.3 g/dL (ref 30.0–36.0)
MCV: 78.4 fL — ABNORMAL LOW (ref 80.0–100.0)
Platelets: 205 10*3/uL (ref 150–400)
RBC: 3.79 MIL/uL — ABNORMAL LOW (ref 3.87–5.11)
RDW: 15.3 % (ref 11.5–15.5)
WBC: 5.1 10*3/uL (ref 4.0–10.5)
nRBC: 0 % (ref 0.0–0.2)

## 2020-10-25 LAB — RENAL FUNCTION PANEL
Albumin: 3.4 g/dL — ABNORMAL LOW (ref 3.5–5.0)
Anion gap: 9 (ref 5–15)
BUN: 27 mg/dL — ABNORMAL HIGH (ref 8–23)
CO2: 26 mmol/L (ref 22–32)
Calcium: 8.9 mg/dL (ref 8.9–10.3)
Chloride: 107 mmol/L (ref 98–111)
Creatinine, Ser: 2.35 mg/dL — ABNORMAL HIGH (ref 0.44–1.00)
GFR, Estimated: 21 mL/min — ABNORMAL LOW (ref >60)
Glucose, Bld: 111 mg/dL — ABNORMAL HIGH (ref 70–99)
Phosphorus: 3 mg/dL (ref 2.5–4.6)
Potassium: 3.2 mmol/L — ABNORMAL LOW (ref 3.5–5.1)
Sodium: 142 mmol/L (ref 135–145)

## 2020-10-25 LAB — C4 COMPLEMENT: Complement C4, Body Fluid: 45 mg/dL — ABNORMAL HIGH (ref 12–38)

## 2020-10-25 LAB — URINE CULTURE: Culture: <10000 — AB

## 2020-10-25 LAB — GLUCOSE, CAPILLARY
Glucose-Capillary: 110 mg/dL — ABNORMAL HIGH (ref 70–99)
Glucose-Capillary: 188 mg/dL — ABNORMAL HIGH (ref 70–99)
Glucose-Capillary: 90 mg/dL (ref 70–99)
Glucose-Capillary: 191 mg/dL — ABNORMAL HIGH (ref 70–99)

## 2020-10-25 LAB — VITAMIN B12: Vitamin B-12: 522 pg/mL (ref 180–914)

## 2020-10-25 LAB — FERRITIN: Ferritin: 145 ng/mL (ref 11–307)

## 2020-10-25 LAB — ANA W/REFLEX IF POSITIVE: Anti Nuclear Antibody (ANA): NEGATIVE

## 2020-10-25 LAB — FOLATE: Folate: 12.4 ng/mL (ref >5.9)

## 2020-10-25 LAB — C3 COMPLEMENT: C3 Complement: 118 mg/dL (ref 82–167)

## 2020-10-25 LAB — MAGNESIUM: Magnesium: 1.7 mg/dL (ref 1.7–2.4)

## 2020-10-25 LAB — GLOMERULAR BASEMENT MEMBRANE ANTIBODIES: GBM Ab: 2 units (ref 0–20)

## 2020-10-25 LAB — CK: Total CK: 406 U/L — ABNORMAL HIGH (ref 38–234)

## 2020-10-25 MED ORDER — MAGNESIUM SULFATE 2 GM/50ML IV SOLN
2.0000 g | Freq: Once | INTRAVENOUS | Status: AC
  Administered 2020-10-25: 2 g via INTRAVENOUS
  Filled 2020-10-25: qty 50

## 2020-10-25 MED ORDER — POTASSIUM CHLORIDE CRYS ER 20 MEQ PO TBCR
40.0000 meq | EXTENDED_RELEASE_TABLET | Freq: Once | ORAL | Status: AC
  Administered 2020-10-25: 40 meq via ORAL
  Filled 2020-10-25: qty 2

## 2020-10-25 MED ORDER — SODIUM CHLORIDE 0.9 % IV SOLN: INTRAVENOUS | Status: DC

## 2020-10-25 NOTE — Progress Notes (Signed)
PROGRESS NOTE    Tina Mcdonald  CWC:[id number] DOB: 02-05-1941 DOA: 10/23/2020 PCP: [name], NP    Chief Complaint  Patient presents with  . 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 PCP attempted to contact patient to ask her to go the ED however she was out of town. She did go to the ED but left before she could be seen last week. Patient notes the only medication changes she has had has been per her PCP which have included addition of oxybutynin,holding of metformin and HCTZ and gabapentinandchanging of her statins due to intermittent fatigue. Renal ultrasound consistent with medical renal disease, no obstruction. CK elevated at 770>>652 Nephrology was consulted.   Assessment & Plan:   Principal Problem:   AKI (acute kidney injury) ([place]) Active Problems:   Type 2 diabetes mellitus with chronic kidney disease ([place])   Hypertension associated with diabetes ([place])   Hyperlipidemia associated with type 2 diabetes mellitus ([place])   Diabetic neuropathy ([facility])   GERD (gastroesophageal reflux disease)   1 acute kidney injury on chronic kidney disease stage IIIa -Questionable etiology.  Concern for possible prerenal azotemia in the setting of HCTZ which was held per PCP.  Patient noted with elevated CK but not high enough for concerns for rhabdomyolysis.  Patient also noted to have been on statin. -Renal ultrasound with medical renal disease, negative for hydronephrosis. -Urine output not properly recorded. -Renal function slowly improving with hydration creatinine currently at 2.35 from 3.45 on admission. -Patient seen in consultation by nephrology and SPEP/UPEP/ANCA/ANA ordered and  pending. -Continue hydration with IV fluids. -Per nephrology.  2.  Elevated CK -Patient denies any significant myalgias or injuries. -Statin on hold. -IV fluids.  3.  Type 2 diabetes mellitus -Noted to have been on metformin prior to admission which has been discontinued. -We will not recommend resuming metformin on discharge. -Hemoglobin A1c 6.5 (08/24/2020). -CBG 110 this morning -SSI.  4.  Hypertension -Continue clonidine.  5.  Hyperlipidemia -Statin on hold. -Outpatient follow-up.  6.  Hypokalemia/hypomagnesemia -Oral potassium supplementation ordered already this morning per nephrology. -Magnesium 2 g IV x1. -Repeat labs in the morning.  7.  GERD -PPI.  8.  Anemia -Likely anemia of chronic disease. -Check an anemia panel. -Follow H&H. -Transfusion threshold hemoglobin < 7.    DVT prophylaxis: Heparin Code Status: Full Family Communication: Updated patient.  No family at bedside. Disposition:   Status is: Inpatient    Dispo: The patient is from: Home              Anticipated d/c is to: Home              Patient currently with acute kidney injury, on IV fluids, being followed by nephrology.  Not stable for discharge.   Difficult to place patient no       Consultants:   Nephrology: Dr. Holley Raring 10/24/2020  Procedures:   Renal ultrasound 10/23/2020  Antimicrobials:   None   Subjective: Laying in bed.  No chest pain.  No shortness of breath.  No abdominal pain.  Tolerating current diet.  States ate some syrup on her pancakes and I may be why her blood glucose levels were elevated  this morning.  Objective: Vitals:   10/25/20 0017 10/25/20 0504 10/25/20 0801 10/25/20 1512  BP: (!) 134/55 140/60 (!) 160/81 (!) 153/72  Pulse: 76 73 73 69  Resp: 16 16 16 16   Temp: 98.7 F (37.1 C) 98.4 F (36.9 C) 98.4 F (36.9 C) 98.5 F (36.9 C)  TempSrc: Oral Oral    SpO2: 98% 98% 98% 100%  Weight:      Height:        Intake/Output Summary (Last 24  hours) at 10/25/2020 1709 Last data filed at 10/25/2020 1500 Gross per 24 hour  Intake 886.87 ml  Output --  Net 886.87 ml   Filed Weights   10/23/20 1348 10/23/20 1352  Weight: 70.3 kg 70.3 kg    Examination:  General exam: Appears calm and comfortable  Respiratory system: Clear to auscultation. Respiratory effort normal. Cardiovascular system: S1 & S2 heard, RRR. No JVD, murmurs, rubs, gallops or clicks. No pedal edema. Gastrointestinal system: Abdomen is nondistended, soft and nontender. No organomegaly or masses felt. Normal bowel sounds heard. Central nervous system: Alert and oriented. No focal neurological deficits. Extremities: Symmetric 5 x 5 power. Skin: No rashes, lesions or ulcers Psychiatry: Judgement and insight appear normal. Mood & affect appropriate.     Data Reviewed: I have personally reviewed following labs and imaging studies  CBC: Recent Labs  Lab 10/22/20 1452 10/23/20 1449 10/24/20 0458 10/25/20 0619  WBC 6.5 5.6 5.6 5.1  NEUTROABS 4.0  --   --   --   HGB 9.8* 9.9* 8.9* 9.3*  HCT 30.9* 32.1* 28.7* 29.7*  MCV 77* 78.9* 78.0* 78.4*  PLT 233 218 206 706    Basic Metabolic Panel: Recent Labs  Lab 10/22/20 1452 10/23/20 1449 10/24/20 0458 10/25/20 0619  NA 141 143 142 142  K 3.5 3.4* 3.1* 3.2*  CL 101 107 109 107  CO2 22 25 25 26   GLUCOSE 219* 147* 93 111*  BUN 44* 43* 38* 27*  CREATININE 3.45* 3.56* 2.98* 2.35*  CALCIUM 9.2 9.0 8.3* 8.9  MG  --   --  1.5* 1.7  PHOS  --   --   --  3.0    GFR: Estimated Creatinine Clearance: 18.1 mL/min (A) (by C-G formula based on SCr of 2.35 mg/dL (H)).  Liver Function Tests: Recent Labs  Lab 10/22/20 1452 10/23/20 1449 10/24/20 0458 10/25/20 0619  AST 30 31 23   --   ALT 31 28 22   --   ALKPHOS 75 58 54  --   BILITOT <0.2 0.7 0.5  --   PROT 6.5 7.1 6.0*  --   ALBUMIN 4.0 3.6 3.2* 3.4*    CBG: Recent Labs  Lab 10/24/20 1626 10/24/20 2127 10/25/20 0802 10/25/20 1154 10/25/20 1627   GLUCAP 95 126* 110* 188* 90     Recent Results (from the past 240 hour(s))  Microscopic Examination     Status: Abnormal   Collection Time: 10/22/20  2:47 PM   Urine  Result Value Ref Range Status   WBC, UA None seen 0 - 5 /hpf Final   RBC 0-2 0 - 2 /hpf Final   Epithelial Cells (non renal) 0-10 0 - 10 /hpf Final   Casts Present (A) None seen /lpf Final   Cast Type Granular casts (A) N/A Final   Crystals Present (A) N/A Final   Crystal Type Amorphous Sediment N/A Final   Mucus, UA Present (A) Not Estab. Final   Bacteria, UA Few (A) None seen/Few Final  SARS CORONAVIRUS 2 (TAT 6-24 HRS) Nasopharyngeal Nasopharyngeal Swab     Status: None   Collection Time: 10/23/20  4:02 PM   Specimen: Nasopharyngeal Swab  Result Value Ref Range Status   SARS Coronavirus 2 NEGATIVE NEGATIVE Final    Comment: (NOTE) SARS-CoV-2 target nucleic acids are NOT DETECTED.  The SARS-CoV-2 RNA is generally detectable in upper and lower respiratory specimens during the acute phase of infection. Negative results do not preclude SARS-CoV-2 infection, do not rule out co-infections with other pathogens, and should not be used as the sole basis for treatment or other patient management decisions. Negative results must be combined with clinical observations, patient history, and epidemiological information. The expected result is Negative.  Fact Sheet for Patients: SugarRoll.be  Fact Sheet for Healthcare Providers: https://www.woods-mathews.com/  This test is not yet approved or cleared by the [place] FDA and  has been authorized for detection and/or diagnosis of SARS-CoV-2 by FDA under an Emergency Use Authorization (EUA). This EUA will remain  in effect (meaning this test can be used) for the duration of the COVID-19 declaration under Se ction 564(b)(1) of the Act, 21 U.S.C. section 360bbb-3(b)(1), unless the authorization is terminated or revoked  sooner.  Performed at [facility], [place] [address] [place], [place] [address]   Urine Culture     Status: Abnormal   Collection Time: 10/24/20  1:30 PM   Specimen: Urine, Clean Catch  Result Value Ref Range Status   Specimen Description   Final    URINE, CLEAN CATCH Performed at [facility], [address], [place], [place] [address]    Special Requests   Final    NONE Performed at [facility], [address], [place], [place] [address]    Culture (A)  Final    <10,000 COLONIES/mL INSIGNIFICANT GROWTH Performed at [facility], [place] [address], [place], [place] [address]    Report Status 10/25/2020 FINAL  Final         Radiology Studies: No results found.      Scheduled Meds: . cloNIDine  0.1 mg Oral BID  . ferrous sulfate  325 mg Oral BID WC  . heparin  5,000 Units Subcutaneous Q8H  . insulin aspart  0-6 Units Subcutaneous TID WC  . magnesium chloride  1 tablet Oral BID  . pantoprazole  40 mg Oral Daily   Continuous Infusions: . sodium chloride 60 mL/hr at 10/25/20 1012     LOS: 2 days    Time spent: 35 minutes    Irine Seal, MD [facility]   To contact the attending provider between 7A-7P or the covering provider during after hours 7P-7A, please log into the web site [email or web address] and access using universal [place] password for that web site. If you do not have the password, please call the hospital operator.  10/25/2020, 5:09 PM

## 2020-10-25 NOTE — Progress Notes (Signed)
[facility]  ROUNDING NOTE   Subjective:   Patient seen resting in bed Alert and oriented States she feels fine, good appetite Denies nausea and shortness of breath   Objective:  Vital signs in last 24 hours:  Temp:  [97.7 F (36.5 C)-98.7 F (37.1 C)] 98.4 F (36.9 C) (05/05 0801) Pulse Rate:  [71-77] 73 (05/05 0801) Resp:  [16-18] 16 (05/05 0801) BP: (134-164)/(47-81) 160/81 (05/05 0801) SpO2:  [98 %-100 %] 98 % (05/05 0801) FiO2 (%):  [0 %] 0 % (05/05 0504)  Weight change:  Filed Weights   10/23/20 1348 10/23/20 1352  Weight: 70.3 kg 70.3 kg    Intake/Output: I/O last 3 completed shifts: In: 340 [P.O.:240; I.V.:100] Out: -    Intake/Output this shift:  No intake/output data recorded.  Physical Exam: General: NAD, laying in bed  Head: Normocephalic, atraumatic. Moist oral mucosal membranes  Eyes: Anicteric  Lungs:  Clear to auscultation  Heart: Regular rate and rhythm  Abdomen:  Soft, nontender,   Extremities:  no peripheral edema.  Neurologic: Nonfocal, moving all four extremities  Skin: No lesions       Basic Metabolic Panel: Recent Labs  Lab 10/18/20 1427 10/22/20 1452 10/23/20 1449 10/24/20 0458 10/25/20 0619  NA 143 141 143 142 142  K 3.3* 3.5 3.4* 3.1* 3.2*  CL 100 101 107 109 107  CO2 24 22 25 25 26   GLUCOSE 200* 219* 147* 93 111*  BUN 38* 44* 43* 38* 27*  CREATININE 3.03* 3.45* 3.56* 2.98* 2.35*  CALCIUM 9.2 9.2 9.0 8.3* 8.9  MG 1.6  --   --  1.5*  --   PHOS  --   --   --   --  3.0    Liver Function Tests: Recent Labs  Lab 10/18/20 1427 10/22/20 1452 10/23/20 1449 10/24/20 0458 10/25/20 0619  AST 22 30 31 23   --   ALT 28 31 28 22   --   ALKPHOS 76 75 58 54  --   BILITOT <0.2 <0.2 0.7 0.5  --   PROT 6.8 6.5 7.1 6.0*  --   ALBUMIN 4.1 4.0 3.6 3.2* 3.4*   No results for input(s): LIPASE, AMYLASE in the last 168 hours. No results for input(s): AMMONIA in the last 168 hours.  CBC: Recent Labs  Lab  10/18/20 1427 10/22/20 1452 10/23/20 1449 10/24/20 0458 10/25/20 0619  WBC 5.9 6.5 5.6 5.6 5.1  NEUTROABS 3.4 4.0  --   --   --   HGB 9.9* 9.8* 9.9* 8.9* 9.3*  HCT 31.9* 30.9* 32.1* 28.7* 29.7*  MCV 77* 77* 78.9* 78.0* 78.4*  PLT 238 233 218 206 205    Cardiac Enzymes: Recent Labs  Lab 10/22/20 1452 10/23/20 1449 10/25/20 0619  CKTOTAL 770* 652* 406*    BNP: Invalid input(s): POCBNP  CBG: Recent Labs  Lab 10/24/20 1219 10/24/20 1626 10/24/20 2127 10/25/20 0802  GLUCAP 179* 95 126* 110*    Microbiology: Results for orders placed or performed during the hospital encounter of 10/23/20  SARS CORONAVIRUS 2 (TAT 6-24 HRS) Nasopharyngeal Nasopharyngeal Swab     Status: None   Collection Time: 10/23/20  4:02 PM   Specimen: Nasopharyngeal Swab  Result Value Ref Range Status   SARS Coronavirus 2 NEGATIVE NEGATIVE Final    Comment: (NOTE) SARS-CoV-2 target nucleic acids are NOT DETECTED.  The SARS-CoV-2 RNA is generally detectable in upper and lower respiratory specimens during the acute phase of infection. Negative results do not preclude  SARS-CoV-2 infection, do not rule out co-infections with other pathogens, and should not be used as the sole basis for treatment or other patient management decisions. Negative results must be combined with clinical observations, patient history, and epidemiological information. The expected result is Negative.  Fact Sheet for Patients: SugarRoll.be  Fact Sheet for Healthcare Providers: https://www.woods-mathews.com/  This test is not yet approved or cleared by the [place] FDA and  has been authorized for detection and/or diagnosis of SARS-CoV-2 by FDA under an Emergency Use Authorization (EUA). This EUA will remain  in effect (meaning this test can be used) for the duration of the COVID-19 declaration under Se ction 564(b)(1) of the Act, 21 U.S.C. section 360bbb-3(b)(1), unless  the authorization is terminated or revoked sooner.  Performed at [facility], [place] [address], [place], [place] [address]     Coagulation Studies: No results for input(s): LABPROT, INR in the last 72 hours.  Urinalysis: Recent Labs    10/22/20 1447 10/23/20 1621  COLORURINE  --  YELLOW*  LABSPEC  --  1.011  PHURINE  --  5.0  GLUCOSEU Negative NEGATIVE  HGBUR  --  SMALL*  BILIRUBINUR Negative NEGATIVE  KETONESUR  --  NEGATIVE  PROTEINUR 2+* 30*  NITRITE Negative NEGATIVE  LEUKOCYTESUR Negative NEGATIVE      Imaging: US Renal  Result Date: 10/23/2020 CLINICAL DATA:  Acute kidney injury EXAM: RENAL / URINARY TRACT ULTRASOUND COMPLETE COMPARISON:  03/23/2015, CT 02/05/2017 FINDINGS: Right Kidney: Renal measurements: 10.7 x 4 x 5.1 cm = volume: 112 mL. Cortex is slightly echogenic. No hydronephrosis or mass. Left Kidney: Renal measurements: 10.7 x 5 x 4.5 cm = volume: 125 mL. Cortex is slightly echogenic. No hydronephrosis or mass. Bladder: Appears normal for degree of bladder distention. Other: None. IMPRESSION: 1. Echogenic kidneys bilaterally consistent with medical renal disease. 2. Negative for hydronephrosis Electronically Signed   By: Donavan Foil M.D.   On: 10/23/2020 17:04     Medications:   . sodium chloride    . lactated ringers     . cloNIDine  0.1 mg Oral BID  . ferrous sulfate  325 mg Oral BID WC  . heparin  5,000 Units Subcutaneous Q8H  . insulin aspart  0-6 Units Subcutaneous TID WC  . magnesium chloride  1 tablet Oral BID  . pantoprazole  40 mg Oral Daily  . potassium chloride  40 mEq Oral Once   acetaminophen **OR** acetaminophen  Assessment/ Plan:  [name] is a 80 y.o.  female with past medical history of dyslipidemia, and hypertension. She presented to ED after receiving a call from her primary care physician about abnormal labs and was told to come to the ED.   1. Acute Kidney Injury likely due to dehydration with baseline  creatinine 0.91  on 08/24/20.  Creatinine improved to 2.35 Will hold off on renal biopsy for now Gentle hydration with NS SPEP, UPEP, ANCA, and ANA pending Will continue to monitor   Lab Results  Component Value Date   CREATININE 2.35 (H) 10/25/2020   CREATININE 2.98 (H) 10/24/2020   CREATININE 3.56 (H) 10/23/2020    Intake/Output Summary (Last 24 hours) at 10/25/2020 0908 Last data filed at 10/24/2020 1813 Gross per 24 hour  Intake 240 ml  Output --  Net 240 ml   2. Diabetes mellitus type II without complications noninsulin dependent. Home regimen includes Metformin.  Most recent hemoglobin A1c is 7.8 on 10/23/20.  Metformin held at this time  Primary team will manage SSi  3. Hypokalemia likely due HCTZ depletion HCTZ d/c'd prior to admission Current level 3.2 Oral supplementation with KCL 20mEq once    LOS: 2 Hennessy Bartel 5/5/20229:08 AM

## 2020-10-26 DIAGNOSIS — N179 Acute kidney failure, unspecified: Secondary | ICD-10-CM

## 2020-10-26 LAB — RENAL FUNCTION PANEL
Albumin: 3.3 g/dL — ABNORMAL LOW (ref 3.5–5.0)
Anion gap: 9 (ref 5–15)
BUN: 22 mg/dL (ref 8–23)
CO2: 25 mmol/L (ref 22–32)
Calcium: 8.7 mg/dL — ABNORMAL LOW (ref 8.9–10.3)
Chloride: 108 mmol/L (ref 98–111)
Creatinine, Ser: 2.05 mg/dL — ABNORMAL HIGH (ref 0.44–1.00)
GFR, Estimated: 24 mL/min — ABNORMAL LOW (ref >60)
Glucose, Bld: 112 mg/dL — ABNORMAL HIGH (ref 70–99)
Phosphorus: 2.5 mg/dL (ref 2.5–4.6)
Potassium: 3.4 mmol/L — ABNORMAL LOW (ref 3.5–5.1)
Sodium: 142 mmol/L (ref 135–145)

## 2020-10-26 LAB — CBC
HCT: 30.1 % — ABNORMAL LOW (ref 36.0–46.0)
Hemoglobin: 9.2 g/dL — ABNORMAL LOW (ref 12.0–15.0)
MCH: 24 pg — ABNORMAL LOW (ref 26.0–34.0)
MCHC: 30.6 g/dL (ref 30.0–36.0)
MCV: 78.6 fL — ABNORMAL LOW (ref 80.0–100.0)
Platelets: 191 10*3/uL (ref 150–400)
RBC: 3.83 MIL/uL — ABNORMAL LOW (ref 3.87–5.11)
RDW: 15.1 % (ref 11.5–15.5)
WBC: 5 10*3/uL (ref 4.0–10.5)
nRBC: 0 % (ref 0.0–0.2)

## 2020-10-26 LAB — PROTEIN ELECTROPHORESIS, SERUM
A/G Ratio: 1.2 (ref 0.7–1.7)
Albumin ELP: 3.2 g/dL (ref 2.9–4.4)
Alpha-1-Globulin: 0.2 g/dL (ref 0.0–0.4)
Alpha-2-Globulin: 0.9 g/dL (ref 0.4–1.0)
Beta Globulin: 0.7 g/dL (ref 0.7–1.3)
Gamma Globulin: 0.7 g/dL (ref 0.4–1.8)
Globulin, Total: 2.6 g/dL (ref 2.2–3.9)
Total Protein ELP: 5.8 g/dL — ABNORMAL LOW (ref 6.0–8.5)

## 2020-10-26 LAB — PROTEIN ELECTRO, RANDOM URINE
Albumin ELP, Urine: 17.4 %
Alpha-1-Globulin, U: 6.2 %
Alpha-2-Globulin, U: 29 %
Beta Globulin, U: 29 %
Gamma Globulin, U: 18.4 %
Total Protein, Urine: 12.7 mg/dL

## 2020-10-26 LAB — MPO/PR-3 (ANCA) ANTIBODIES
ANCA Proteinase 3: <3.5 U/mL (ref 0.0–3.5)
Myeloperoxidase Abs: <9 U/mL (ref 0.0–9.0)

## 2020-10-26 LAB — GLUCOSE, CAPILLARY
Glucose-Capillary: 110 mg/dL — ABNORMAL HIGH (ref 70–99)
Glucose-Capillary: 160 mg/dL — ABNORMAL HIGH (ref 70–99)
Glucose-Capillary: 110 mg/dL — ABNORMAL HIGH (ref 70–99)
Glucose-Capillary: 138 mg/dL — ABNORMAL HIGH (ref 70–99)

## 2020-10-26 LAB — MAGNESIUM: Magnesium: 2 mg/dL (ref 1.7–2.4)

## 2020-10-26 MED ORDER — METOPROLOL SUCCINATE ER 50 MG PO TB24
50.0000 mg | ORAL_TABLET | Freq: Every day | ORAL | Status: DC
  Administered 2020-10-26 – 2020-10-27 (×2): 50 mg via ORAL
  Filled 2020-10-26 (×2): qty 1

## 2020-10-26 NOTE — Plan of Care (Signed)
  Problem: Education: Goal: Knowledge of General Education information will improve Description: Including pain rating scale, medication(s)/side effects and non-pharmacologic comfort measures 10/26/2020 0037 by Nickola Major, RN Outcome: Progressing 10/26/2020 0037 by Nickola Major, RN Outcome: Progressing

## 2020-10-26 NOTE — Progress Notes (Signed)
PROGRESS NOTE    Tina Mcdonald  XNA:[id number] DOB: [birth year] DOA: 10/23/2020 PCP: [name], NP    Chief Complaint  Patient presents with  . 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 PCP attempted to contact patient to ask her to go the ED however she was out of town. She did go to the ED but left before she could be seen last week. Patient notes the only medication changes she has had has been per her PCP which have included addition of oxybutynin,holding of metformin and HCTZ and gabapentinandchanging of her statins due to intermittent fatigue. Renal ultrasound consistent with medical renal disease, no obstruction. CK elevated at 770>>652 Nephrology was consulted.   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   -Renal ultrasound with medical renal disease, negative for hydronephrosis. -Urine output not properly recorded, but pt reported good urine output -Renal function slowly improving with hydration  --nephrology consulted --SPEP, UPEP, ANCA, and ANA not significant Plan: --d/c MIVF today --monitor off fluid for 1 more day, per nephrology  2.  Elevated CK -Patient denies any significant myalgias or  injuries. -Statin on hold. --s/p IVF --d/c MIVF today  3.  Type 2 diabetes mellitus -Noted to have been on metformin prior to admission  -Hemoglobin A1c 6.5 (08/24/2020). --has not required much SSI --d/c BG checks and SSI  4.  Hypertension -Continue clonidine. --resume home Toprol --continue to hold HCTZ  5.  Hyperlipidemia -Statin on hold. -Outpatient follow-up.  6.  Hypokalemia --monitor and replete PRN  hypomagnesemia --cont oral mag scheduled --replete with IV mag PRN  7.  GERD -PPI.  8.  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 Holley Raring 10/24/2020  Procedures:   Renal ultrasound 10/23/2020  Antimicrobials:   None   Subjective: Pt reported good urine output.  No pain.  Normal oral intake.   Objective: Vitals:   10/25/20 2054 10/26/20 0455 10/26/20 0813 10/26/20 1641  BP: 132/66 (!) 146/64 (!) 171/85 (!) 146/67  Pulse: 74 73 79 74  Resp: 16 18 17 16   Temp: 98.3 F (36.8 C) 97.8 F (36.6 C) 98.7 F (37.1 C) 98.2 F (36.8 C)  TempSrc:  Oral Oral Oral  SpO2: 100% 100% 100% 96%  Weight:      Height:  Intake/Output Summary (Last 24 hours) at 10/26/2020 1649 Last data filed at 10/26/2020 1024 Gross per 24 hour  Intake 434.96 ml  Output --  Net 434.96 ml   Filed Weights   10/23/20 1348 10/23/20 1352  Weight: 70.3 kg 70.3 kg    Examination:  Constitutional: NAD, AAOx3 HEENT: conjunctivae and lids normal, EOMI CV: No cyanosis.   RESP: normal respiratory effort, on RA Extremities: No effusions, edema in BLE SKIN: warm, dry Neuro: II - XII grossly intact.   Psych: Normal mood and affect.  Appropriate judgement and reason   Data  Reviewed: I have personally reviewed following labs and imaging studies  CBC: Recent Labs  Lab 10/22/20 1452 10/23/20 1449 10/24/20 0458 10/25/20 0619 10/26/20 0450  WBC 6.5 5.6 5.6 5.1 5.0  NEUTROABS 4.0  --   --   --   --   HGB 9.8* 9.9* 8.9* 9.3* 9.2*  HCT 30.9* 32.1* 28.7* 29.7* 30.1*  MCV 77* 78.9* 78.0* 78.4* 78.6*  PLT 233 218 206 205 174    Basic Metabolic Panel: Recent Labs  Lab 10/22/20 1452 10/23/20 1449 10/24/20 0458 10/25/20 0619 10/26/20 0450  NA 141 143 142 142 142  K 3.5 3.4* 3.1* 3.2* 3.4*  CL 101 107 109 107 108  CO2 22 25 25 26 25   GLUCOSE 219* 147* 93 111* 112*  BUN 44* 43* 38* 27* 22  CREATININE 3.45* 3.56* 2.98* 2.35* 2.05*  CALCIUM 9.2 9.0 8.3* 8.9 8.7*  MG  --   --  1.5* 1.7 2.0  PHOS  --   --   --  3.0 2.5    GFR: Estimated Creatinine Clearance: 20.8 mL/min (A) (by C-G formula based on SCr of 2.05 mg/dL (H)).  Liver Function Tests: Recent Labs  Lab 10/22/20 1452 10/23/20 1449 10/24/20 0458 10/25/20 0619 10/26/20 0450  AST 30 31 23   --   --   ALT 31 28 22   --   --   ALKPHOS 75 58 54  --   --   BILITOT <0.2 0.7 0.5  --   --   PROT 6.5 7.1 6.0*  --   --   ALBUMIN 4.0 3.6 3.2* 3.4* 3.3*    CBG: Recent Labs  Lab 10/25/20 1154 10/25/20 1627 10/25/20 2053 10/26/20 0802 10/26/20 1126  GLUCAP 188* 90 191* 110* 160*     Recent Results (from the past 240 hour(s))  Microscopic Examination     Status: Abnormal   Collection Time: 10/22/20  2:47 PM   Urine  Result Value Ref Range Status   WBC, UA None seen 0 - 5 /hpf Final   RBC 0-2 0 - 2 /hpf Final   Epithelial Cells (non renal) 0-10 0 - 10 /hpf Final   Casts Present (A) None seen /lpf Final   Cast Type Granular casts (A) N/A Final   Crystals Present (A) N/A Final   Crystal Type Amorphous Sediment N/A Final   Mucus, UA Present (A) Not Estab. Final   Bacteria, UA Few (A) None seen/Few Final  SARS CORONAVIRUS 2 (TAT 6-24 HRS) Nasopharyngeal Nasopharyngeal Swab     Status:  None   Collection Time: 10/23/20  4:02 PM   Specimen: Nasopharyngeal Swab  Result Value Ref Range Status   SARS Coronavirus 2 NEGATIVE NEGATIVE Final    Comment: (NOTE) SARS-CoV-2 target nucleic acids are NOT DETECTED.  The SARS-CoV-2 RNA is generally detectable in upper and lower respiratory specimens during the acute phase of infection. Negative results do  not preclude SARS-CoV-2 infection, do not rule out co-infections with other pathogens, and should not be used as the sole basis for treatment or other patient management decisions. Negative results must be combined with clinical observations, patient history, and epidemiological information. The expected result is Negative.  Fact Sheet for Patients: SugarRoll.be  Fact Sheet for Healthcare Providers: https://www.woods-mathews.com/  This test is not yet approved or cleared by the [place] FDA and  has been authorized for detection and/or diagnosis of SARS-CoV-2 by FDA under an Emergency Use Authorization (EUA). This EUA will remain  in effect (meaning this test can be used) for the duration of the COVID-19 declaration under Se ction 564(b)(1) of the Act, 21 U.S.C. section 360bbb-3(b)(1), unless the authorization is terminated or revoked sooner.  Performed at [facility], [place] [address], [place], [place] [address]   Urine Culture     Status: Abnormal   Collection Time: 10/24/20  1:30 PM   Specimen: Urine, Clean Catch  Result Value Ref Range Status   Specimen Description   Final    URINE, CLEAN CATCH Performed at [facility], [address], [place], [place] [address]    Special Requests   Final    NONE Performed at [facility], [address], [place], [place] [address]    Culture (A)  Final    <10,000 COLONIES/mL INSIGNIFICANT GROWTH Performed at [facility], [employer] [address], [place], [place] [address]    Report Status 10/25/2020 FINAL   Final         Radiology Studies: No results found.      Scheduled Meds: . cloNIDine  0.1 mg Oral BID  . ferrous sulfate  325 mg Oral BID WC  . heparin  5,000 Units Subcutaneous Q8H  . insulin aspart  0-6 Units Subcutaneous TID WC  . magnesium chloride  1 tablet Oral BID  . metoprolol succinate  50 mg Oral Q breakfast  . pantoprazole  40 mg Oral Daily   Continuous Infusions: . sodium chloride 60 mL/hr at 10/26/20 1306     LOS: 3 days    Enzo Bi, MD [facility]   To contact the attending provider between 7A-7P or the covering provider during after hours 7P-7A, please log into the web site [email or web address] and access using universal [place] password for that web site. If you do not have the password, please call the hospital operator.  10/26/2020, 4:49 PM

## 2020-10-26 NOTE — Plan of Care (Incomplete)
  Problem: Education: Goal: Knowledge of General Education information will improve Description Including pain rating scale, medication(s)/side effects and non-pharmacologic comfort measures Outcome: Progressing   

## 2020-10-26 NOTE — Care Management Important Message (Signed)
Important Message  Patient Details  Name: Tina Mcdonald MRN: [id number] Date of Birth: [birth year]   Medicare Important Message Given:  Yes     Juliann Pulse A Jisselle Poth 10/26/2020, 2:40 PM

## 2020-10-26 NOTE — Progress Notes (Signed)
[facility]  ROUNDING NOTE   Subjective:   Patient seen walking in room Alert and oriented Tolerating meals Denies shortness of berath   Objective:  Vital signs in last 24 hours:  Temp:  [97.8 F (36.6 C)-98.7 F (37.1 C)] 98.7 F (37.1 C) (05/06 0813) Pulse Rate:  [69-79] 79 (05/06 0813) Resp:  [16-18] 17 (05/06 0813) BP: (132-171)/(64-85) 171/85 (05/06 0813) SpO2:  [100 %] 100 % (05/06 0813)  Weight change:  Filed Weights   10/23/20 1348 10/23/20 1352  Weight: 70.3 kg 70.3 kg    Intake/Output: I/O last 3 completed shifts: In: 841.8 [P.O.:360; I.V.:451; IV Piggyback:30.8] Out: -    Intake/Output this shift:  Total I/O In: 240 [P.O.:240] Out: -   Physical Exam: General: NAD, ambulating in room  Head: Normocephalic, atraumatic. Moist oral mucosal membranes  Eyes: Anicteric  Lungs:  Clear to auscultation  Heart: Regular rate and rhythm  Abdomen:  Soft, nontender,   Extremities:  no peripheral edema.  Neurologic: Nonfocal, moving all four extremities  Skin: No lesions       Basic Metabolic Panel: Recent Labs  Lab 10/22/20 1452 10/23/20 1449 10/24/20 0458 10/25/20 0619 10/26/20 0450  NA 141 143 142 142 142  K 3.5 3.4* 3.1* 3.2* 3.4*  CL 101 107 109 107 108  CO2 22 25 25 26 25   GLUCOSE 219* 147* 93 111* 112*  BUN 44* 43* 38* 27* 22  CREATININE 3.45* 3.56* 2.98* 2.35* 2.05*  CALCIUM 9.2 9.0 8.3* 8.9 8.7*  MG  --   --  1.5* 1.7 2.0  PHOS  --   --   --  3.0 2.5    Liver Function Tests: Recent Labs  Lab 10/22/20 1452 10/23/20 1449 10/24/20 0458 10/25/20 0619 10/26/20 0450  AST 30 31 23   --   --   ALT 31 28 22   --   --   ALKPHOS 75 58 54  --   --   BILITOT <0.2 0.7 0.5  --   --   PROT 6.5 7.1 6.0*  --   --   ALBUMIN 4.0 3.6 3.2* 3.4* 3.3*   No results for input(s): LIPASE, AMYLASE in the last 168 hours. No results for input(s): AMMONIA in the last 168 hours.  CBC: Recent Labs  Lab 10/22/20 1452 10/23/20 1449  10/24/20 0458 10/25/20 0619 10/26/20 0450  WBC 6.5 5.6 5.6 5.1 5.0  NEUTROABS 4.0  --   --   --   --   HGB 9.8* 9.9* 8.9* 9.3* 9.2*  HCT 30.9* 32.1* 28.7* 29.7* 30.1*  MCV 77* 78.9* 78.0* 78.4* 78.6*  PLT 233 218 206 205 191    Cardiac Enzymes: Recent Labs  Lab 10/22/20 1452 10/23/20 1449 10/25/20 0619  CKTOTAL 770* 652* 406*    BNP: Invalid input(s): POCBNP  CBG: Recent Labs  Lab 10/25/20 1154 10/25/20 1627 10/25/20 2053 10/26/20 0802 10/26/20 1126  GLUCAP 188* 90 191* 110* 160*    Microbiology: Results for orders placed or performed during the hospital encounter of 10/23/20  SARS CORONAVIRUS 2 (TAT 6-24 HRS) Nasopharyngeal Nasopharyngeal Swab     Status: None   Collection Time: 10/23/20  4:02 PM   Specimen: Nasopharyngeal Swab  Result Value Ref Range Status   SARS Coronavirus 2 NEGATIVE NEGATIVE Final    Comment: (NOTE) SARS-CoV-2 target nucleic acids are NOT DETECTED.  The SARS-CoV-2 RNA is generally detectable in upper and lower respiratory specimens during the acute phase of infection. Negative results do not  preclude SARS-CoV-2 infection, do not rule out co-infections with other pathogens, and should not be used as the sole basis for treatment or other patient management decisions. Negative results must be combined with clinical observations, patient history, and epidemiological information. The expected result is Negative.  Fact Sheet for Patients: SugarRoll.be  Fact Sheet for Healthcare Providers: https://www.woods-mathews.com/  This test is not yet approved or cleared by the [place] FDA and  has been authorized for detection and/or diagnosis of SARS-CoV-2 by FDA under an Emergency Use Authorization (EUA). This EUA will remain  in effect (meaning this test can be used) for the duration of the COVID-19 declaration under Se ction 564(b)(1) of the Act, 21 U.S.C. section 360bbb-3(b)(1), unless the  authorization is terminated or revoked sooner.  Performed at [facility], [place] [address] [place], [place] [address]   Urine Culture     Status: Abnormal   Collection Time: 10/24/20  1:30 PM   Specimen: Urine, Clean Catch  Result Value Ref Range Status   Specimen Description   Final    URINE, CLEAN CATCH Performed at [facility], [address] [place], [place] [address]    Special Requests   Final    NONE Performed at [facility], [address] [place], [place] [address]    Culture (A)  Final    <10,000 COLONIES/mL INSIGNIFICANT GROWTH Performed at [facility], [place] [address], [place], [place] [address]    Report Status 10/25/2020 FINAL  Final    Coagulation Studies: No results for input(s): LABPROT, INR in the last 72 hours.  Urinalysis: Recent Labs    10/23/20 1621  COLORURINE YELLOW*  LABSPEC 1.011  PHURINE 5.0  GLUCOSEU NEGATIVE  HGBUR SMALL*  BILIRUBINUR NEGATIVE  KETONESUR NEGATIVE  PROTEINUR 30*  NITRITE NEGATIVE  LEUKOCYTESUR NEGATIVE      Imaging: No results found.   Medications:   . sodium chloride 60 mL/hr at 10/26/20 1306   . cloNIDine  0.1 mg Oral BID  . ferrous sulfate  325 mg Oral BID WC  . heparin  5,000 Units Subcutaneous Q8H  . insulin aspart  0-6 Units Subcutaneous TID WC  . magnesium chloride  1 tablet Oral BID  . metoprolol succinate  50 mg Oral Daily  . pantoprazole  40 mg Oral Daily   acetaminophen **OR** acetaminophen  Assessment/ Plan:  [name] is a 80 y.o.  female with past medical history of dyslipidemia, and hypertension. She presented to ED after receiving a call from her primary care physician about abnormal labs and was told to come to the ED.   1. Acute Kidney Injury likely due to dehydration with baseline creatinine 0.91  on 08/24/20.  Creatinine improved to 2.05 D/c IVF SPEP, UPEP, ANCA, and ANA not significant Will continue to monitor Recommend metoprolol and  Clonidine at discharge Will follow up with nephrology at discharge   Lab Results  Component Value Date   CREATININE 2.05 (H) 10/26/2020   CREATININE 2.35 (H) 10/25/2020   CREATININE 2.98 (H) 10/24/2020    Intake/Output Summary (Last 24 hours) at 10/26/2020 1320 Last data filed at 10/26/2020 1024 Gross per 24 hour  Intake 841.83 ml  Output --  Net 841.83 ml   2. Diabetes mellitus type II without complications noninsulin dependent. Home regimen includes Metformin.  Most recent hemoglobin A1c is 7.8 on 10/23/20.  Metformin held at this time Primary team will manage SSi  3. Hypokalemia likely due HCTZ depletion  HCTZ d/c'd prior to admission Current level improving 3.4     LOS: 3 Ottavio Norem 5/6/20221:20 PM

## 2020-10-27 DIAGNOSIS — N179 Acute kidney failure, unspecified: Secondary | ICD-10-CM | POA: Diagnosis not present

## 2020-10-27 LAB — CBC
HCT: 29.3 % — ABNORMAL LOW (ref 36.0–46.0)
Hemoglobin: 9.1 g/dL — ABNORMAL LOW (ref 12.0–15.0)
MCH: 24.5 pg — ABNORMAL LOW (ref 26.0–34.0)
MCHC: 31.1 g/dL (ref 30.0–36.0)
MCV: 78.8 fL — ABNORMAL LOW (ref 80.0–100.0)
Platelets: 177 10*3/uL (ref 150–400)
RBC: 3.72 MIL/uL — ABNORMAL LOW (ref 3.87–5.11)
RDW: 15.3 % (ref 11.5–15.5)
WBC: 5.2 10*3/uL (ref 4.0–10.5)
nRBC: 0 % (ref 0.0–0.2)

## 2020-10-27 LAB — BASIC METABOLIC PANEL
Anion gap: 6 (ref 5–15)
BUN: 20 mg/dL (ref 8–23)
CO2: 26 mmol/L (ref 22–32)
Calcium: 8.7 mg/dL — ABNORMAL LOW (ref 8.9–10.3)
Chloride: 109 mmol/L (ref 98–111)
Creatinine, Ser: 1.91 mg/dL — ABNORMAL HIGH (ref 0.44–1.00)
GFR, Estimated: 27 mL/min — ABNORMAL LOW (ref >60)
Glucose, Bld: 93 mg/dL (ref 70–99)
Potassium: 3.3 mmol/L — ABNORMAL LOW (ref 3.5–5.1)
Sodium: 141 mmol/L (ref 135–145)

## 2020-10-27 LAB — GLUCOSE, CAPILLARY
Glucose-Capillary: 90 mg/dL (ref 70–99)
Glucose-Capillary: 125 mg/dL — ABNORMAL HIGH (ref 70–99)

## 2020-10-27 LAB — MAGNESIUM: Magnesium: 1.7 mg/dL (ref 1.7–2.4)

## 2020-10-27 MED ORDER — IRBESARTAN 300 MG PO TABS: 300.0000 mg | ORAL_TABLET | Freq: Every day | ORAL | 2 refills | Status: DC

## 2020-10-27 MED ORDER — IRBESARTAN 150 MG PO TABS: 300.0000 mg | ORAL_TABLET | Freq: Every day | ORAL | Status: DC

## 2020-10-27 MED ORDER — POTASSIUM CHLORIDE CRYS ER 20 MEQ PO TBCR: 40.0000 meq | EXTENDED_RELEASE_TABLET | Freq: Once | ORAL | Status: DC

## 2020-10-27 MED ORDER — VITAMIN C 250 MG PO TABS: 250.0000 mg | ORAL_TABLET | Freq: Every day | ORAL | Status: DC

## 2020-10-27 MED ORDER — GABAPENTIN 100 MG PO CAPS: 200.0000 mg | ORAL_CAPSULE | Freq: Every day | ORAL | Status: DC

## 2020-10-27 NOTE — Discharge Summary (Signed)
Physician Discharge Summary   Tina Mcdonald  female DOB: November 26, 1940  BCW:[id number]  PCP: [name], NP  Admit date: 10/23/2020 Discharge date: 10/27/2020  Admitted From: home Disposition:  home CODE STATUS: Full code  Discharge Instructions    Discharge instructions   Complete by: As directed    You had acute kidney injury due to dehydration.  Your hydrochlorothiazide was discontinued.  You are prescribed a new blood pressure medication irbesartan.    Please follow up with your outpatient doctor to monitor your kidney function and blood pressure.   Dr. [facility] Course:  For full details, please see H&P, progress notes, consult notes and ancillary notes.  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   PCP attempted to contact patient to ask her to go the ED however she was out of town. She did go to the ED but left before she could be seen last week. Patient notes the only medication changes she has had has been per her PCP which have included addition of oxybutynin,holding of metformin and HCTZ and gabapentinandchanging of her statins due to intermittent fatigue.  1 acute kidney injury on chronic kidney disease stage IIIa Cr 3.45 on presentation.  likely due to dehydration and HCTZ use. Renal ultrasound consistent with medical renal disease, no obstruction. CK elevated at770>>652, but not high enough for concerns for rhabdomyolysis.   -Renal function slowly improving with hydration, was 1.91 prior to discharge.  --nephrology consulted --SPEP, UPEP, ANCA, and ANAnot significant. --Pt will follow up with Nephro Dr. Holley Raring as outpatient.  2.  Elevated CK -Patient  denies any significant myalgias or injuries.  Down-trended with IVF.  3.  Type 2 diabetes mellitus, well controlled -Noted to have been on metformin prior to admission  -Hemoglobin A1c 6.5 (08/24/2020).  4.  Hypertension -Continue clonidine and home Toprol.  HCTZ was d/c'ed by pt's PCP prior to presentation.  Pt was started on irbesartan by nephrology.  5.  Hyperlipidemia -Statin held while inpatient, resumed at discharge.  6.  Hypokalemia --monitor and replete PRN  hypomagnesemia --cont oral mag scheduled --replete with IV mag PRN  7.  GERD -PPI.  8.  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 --anemia workup showed iron, folate and vit B12 all wnl   Discharge Diagnoses:  Principal Problem:   AKI (acute kidney injury) ([place]) Active Problems:   Type 2 diabetes mellitus with chronic kidney disease ([place])   Hypertension associated with diabetes ([place])   Hyperlipidemia associated with type 2 diabetes mellitus ([place])   Diabetic neuropathy (H[facility])   GERD (gastroesophageal reflux disease)   30 Day Unplanned Readmission Risk Score   Flowsheet Row ED to Hosp-Admission (Current) from 10/23/2020 in [facility] (1A)  30 Day Unplanned Readmission Risk Score (%) 13.23 Filed at 10/27/2020 0800     This score is the patient's risk of an unplanned readmission within 30 days of being discharged (0 -100%). The score is based on dignosis, age, lab data, medications, orders, and past utilization.   Low:  0-14.9   Medium: 15-21.9   High: 22-29.9   Extreme: 30 and above        Discharge  Instructions:  Allergies as of 10/27/2020      Reactions   Contrast Media [iodinated Diagnostic Agents] Itching   Penicillins Itching   Has patient had a PCN reaction causing immediate rash, facial/tongue/throat swelling, SOB or lightheadedness with hypotension: Yes Has patient had a PCN reaction causing severe rash involving mucus membranes or skin necrosis: Unknown Has patient had a PCN  reaction that required hospitalization: Yes Has patient had a PCN reaction occurring within the last 10 years: Yes If all of the above answers are "NO", then may proceed with Cephalosporin use.   Sulfa Antibiotics Itching   Latex Rash   Neosporin [neomycin-bacitracin Zn-polymyx] Itching, Rash   Other Rash   Pt has reaction to high acidity foods.       Medication List    STOP taking these medications   ciclopirox 8 % solution Commonly known as: PENLAC     TAKE these medications   acetaminophen 325 MG tablet Commonly known as: TYLENOL Take 650 mg by mouth every 6 (six) hours as needed.   blood glucose meter kit and supplies Kit Dispense based on patient and insurance preference. Use up to four times daily as directed. (FOR ICD-9 250.00, 250.01).   cloNIDine 0.1 MG tablet Commonly known as: CATAPRES Take 0.1 mg by mouth 2 (two) times daily.   diclofenac sodium 1 % Gel Commonly known as: VOLTAREN Apply 2 g topically 2 (two) times daily as needed.   gabapentin 100 MG capsule Commonly known as: NEURONTIN Take 2 capsules (200 mg total) by mouth at bedtime.   glucose blood test strip Check daily. ICD 10 E11.9   irbesartan 300 MG tablet Commonly known as: AVAPRO Take 1 tablet (300 mg total) by mouth daily. New blood pressure medicine.   magnesium chloride 64 MG Tbec SR tablet Commonly known as: SLOW-MAG Take 1 tablet (64 mg total) by mouth 2 (two) times daily.   metFORMIN 500 MG 24 hr tablet Commonly known as: GLUCOPHAGE-XR Take 1 tablet (500 mg total) by mouth 2 (two) times daily.   metoprolol succinate 50 MG 24 hr tablet Commonly known as: TOPROL-XL Take 1 tablet (50 mg total) by mouth daily. Take with or immediately following a meal.   mirabegron ER 50 MG Tb24 tablet Commonly known as: MYRBETRIQ Take 50 mg by mouth daily.   omeprazole 20 MG capsule Commonly known as: PRILOSEC TAKE 1 CAPSULE BY MOUTH  DAILY   rosuvastatin 40 MG tablet Commonly known as:  CRESTOR Take 1 tablet (40 mg total) by mouth daily. Stop Atorvastatin daily.   SLOW FE PO Take 1 tablet by mouth daily.   vitamin C 250 MG tablet Commonly known as: ASCORBIC ACID Take 1 tablet (250 mg total) by mouth daily.        Follow-up Information    [name], NP. Schedule an appointment as soon as possible for a visit in 1 week(s).   Specialty: Nurse Practitioner Contact information: [place] [address] [phone]        Anthonette Legato, MD. Schedule an appointment as soon as possible for a visit in 2 week(s).   Specialty: Nephrology Contact information: [place] [address] [phone]               Allergies  Allergen Reactions  . Contrast Media [Iodinated Diagnostic Agents] Itching  . Penicillins Itching    Has patient had a PCN reaction causing immediate rash, facial/tongue/throat swelling, SOB or lightheadedness with  hypotension: Yes Has patient had a PCN reaction causing severe rash involving mucus membranes or skin necrosis: Unknown Has patient had a PCN reaction that required hospitalization: Yes Has patient had a PCN reaction occurring within the last 10 years: Yes If all of the above answers are "NO", then may proceed with Cephalosporin use.  . Sulfa Antibiotics Itching  . Latex Rash  . Neosporin [Neomycin-Bacitracin Zn-Polymyx] Itching and Rash  . Other Rash    Pt has reaction to high acidity foods.      The results of significant diagnostics from this hospitalization (including imaging, microbiology, ancillary and laboratory) are listed below for reference.   Consultations:   Procedures/Studies: US Renal  Result Date: 10/23/2020 CLINICAL DATA:  Acute kidney injury EXAM: RENAL / URINARY TRACT ULTRASOUND COMPLETE COMPARISON:  03/23/2015, CT 02/05/2017 FINDINGS: Right Kidney: Renal measurements: 10.7 x 4 x 5.1 cm = volume: 112 mL. Cortex is slightly echogenic. No hydronephrosis or mass. Left  Kidney: Renal measurements: 10.7 x 5 x 4.5 cm = volume: 125 mL. Cortex is slightly echogenic. No hydronephrosis or mass. Bladder: Appears normal for degree of bladder distention. Other: None. IMPRESSION: 1. Echogenic kidneys bilaterally consistent with medical renal disease. 2. Negative for hydronephrosis Electronically Signed   By: Donavan Foil M.D.   On: 10/23/2020 17:04      Labs: BNP (last 3 results) No results for input(s): BNP in the last 8760 hours. Basic Metabolic Panel: Recent Labs  Lab 10/23/20 1449 10/24/20 0458 10/25/20 0619 10/26/20 0450 10/27/20 0458  NA 143 142 142 142 141  K 3.4* 3.1* 3.2* 3.4* 3.3*  CL 107 109 107 108 109  CO2 '25 25 26 25 26  ' GLUCOSE 147* 93 111* 112* 93  BUN 43* 38* 27* 22 20  CREATININE 3.56* 2.98* 2.35* 2.05* 1.91*  CALCIUM 9.0 8.3* 8.9 8.7* 8.7*  MG  --  1.5* 1.7 2.0 1.7  PHOS  --   --  3.0 2.5  --    Liver Function Tests: Recent Labs  Lab 10/22/20 1452 10/23/20 1449 10/24/20 0458 10/25/20 0619 10/26/20 0450  AST '30 31 23  ' --   --   ALT '31 28 22  ' --   --   ALKPHOS 75 58 54  --   --   BILITOT <0.2 0.7 0.5  --   --   PROT 6.5 7.1 6.0*  --   --   ALBUMIN 4.0 3.6 3.2* 3.4* 3.3*   No results for input(s): LIPASE, AMYLASE in the last 168 hours. No results for input(s): AMMONIA in the last 168 hours. CBC: Recent Labs  Lab 10/22/20 1452 10/23/20 1449 10/24/20 0458 10/25/20 0619 10/26/20 0450 10/27/20 0458  WBC 6.5 5.6 5.6 5.1 5.0 5.2  NEUTROABS 4.0  --   --   --   --   --   HGB 9.8* 9.9* 8.9* 9.3* 9.2* 9.1*  HCT 30.9* 32.1* 28.7* 29.7* 30.1* 29.3*  MCV 77* 78.9* 78.0* 78.4* 78.6* 78.8*  PLT 233 218 206 205 191 177   Cardiac Enzymes: Recent Labs  Lab 10/22/20 1452 10/23/20 1449 10/25/20 0619  CKTOTAL 770* 652* 406*   BNP: Invalid input(s): POCBNP CBG: Recent Labs  Lab 10/26/20 0802 10/26/20 1126 10/26/20 1729 10/26/20 2131 10/27/20 0811  GLUCAP 110* 160* 110* 138* 90   D-Dimer No results for input(s):  DDIMER in the last 72 hours. Hgb A1c No results for input(s): HGBA1C in the last 72 hours. Lipid Profile No results for input(s): CHOL, HDL, LDLCALC,  TRIG, CHOLHDL, LDLDIRECT in the last 72 hours. Thyroid function studies No results for input(s): TSH, T4TOTAL, T3FREE, THYROIDAB in the last 72 hours.  Invalid input(s): FREET3 Anemia work up Recent Labs    10/25/20 0619 10/25/20 1037  VITAMINB12  --  522  FOLATE 12.4  --   FERRITIN 145  --   TIBC 241*  --   IRON 53  --    Urinalysis    Component Value Date/Time   COLORURINE YELLOW (A) 10/23/2020 1621   APPEARANCEUR CLOUDY (A) 10/23/2020 1621   APPEARANCEUR Cloudy (A) 10/22/2020 1447   LABSPEC 1.011 10/23/2020 1621   PHURINE 5.0 10/23/2020 1621   GLUCOSEU NEGATIVE 10/23/2020 1621   HGBUR SMALL (A) 10/23/2020 1621   BILIRUBINUR NEGATIVE 10/23/2020 1621   BILIRUBINUR Negative 10/22/2020 [place] 10/23/2020 1621   PROTEINUR 30 (A) 10/23/2020 1621   NITRITE NEGATIVE 10/23/2020 1621   LEUKOCYTESUR NEGATIVE 10/23/2020 1621   Sepsis Labs Invalid input(s): PROCALCITONIN,  WBC,  LACTICIDVEN Microbiology Recent Results (from the past 240 hour(s))  Microscopic Examination     Status: Abnormal   Collection Time: 10/22/20  2:47 PM   Urine  Result Value Ref Range Status   WBC, UA None seen 0 - 5 /hpf Final   RBC 0-2 0 - 2 /hpf Final   Epithelial Cells (non renal) 0-10 0 - 10 /hpf Final   Casts Present (A) None seen /lpf Final   Cast Type Granular casts (A) N/A Final   Crystals Present (A) N/A Final   Crystal Type Amorphous Sediment N/A Final   Mucus, UA Present (A) Not Estab. Final   Bacteria, UA Few (A) None seen/Few Final  SARS CORONAVIRUS 2 (TAT 6-24 HRS) Nasopharyngeal Nasopharyngeal Swab     Status: None   Collection Time: 10/23/20  4:02 PM   Specimen: Nasopharyngeal Swab  Result Value Ref Range Status   SARS Coronavirus 2 NEGATIVE NEGATIVE Final    Comment: (NOTE) SARS-CoV-2 target nucleic acids are  NOT DETECTED.  The SARS-CoV-2 RNA is generally detectable in upper and lower respiratory specimens during the acute phase of infection. Negative results do not preclude SARS-CoV-2 infection, do not rule out co-infections with other pathogens, and should not be used as the sole basis for treatment or other patient management decisions. Negative results must be combined with clinical observations, patient history, and epidemiological information. The expected result is Negative.  Fact Sheet for Patients: SugarRoll.be  Fact Sheet for Healthcare Providers: https://www.woods-mathews.com/  This test is not yet approved or cleared by the [place] FDA and  has been authorized for detection and/or diagnosis of SARS-CoV-2 by FDA under an Emergency Use Authorization (EUA). This EUA will remain  in effect (meaning this test can be used) for the duration of the COVID-19 declaration under Se ction 564(b)(1) of the Act, 21 U.S.C. section 360bbb-3(b)(1), unless the authorization is terminated or revoked sooner.  Performed at [facility], [place] [address], [place], [place] [address]   Urine Culture     Status: Abnormal   Collection Time: 10/24/20  1:30 PM   Specimen: Urine, Clean Catch  Result Value Ref Range Status   Specimen Description   Final    URINE, CLEAN CATCH Performed at [facility], [address], [place], [place] [address]    Special Requests   Final    NONE Performed at [facility], [address], [place], [place] [address]    Culture (A)  Final    <  10,000 COLONIES/mL INSIGNIFICANT GROWTH Performed at [facility], [place] [address] [place], [place] [address]    Report Status 10/25/2020 FINAL  Final     Total time spend on discharging this patient, including the last patient exam, discussing the hospital stay, instructions for ongoing care as it relates to all pertinent caregivers, as well as  preparing the medical discharge records, prescriptions, and/or referrals as applicable, is 40 minutes.    Enzo Bi, MD  [facility] 10/27/2020, 10:28 AM

## 2020-10-27 NOTE — Progress Notes (Signed)
[facility]  ROUNDING NOTE   Subjective:   No complaints.   BP 162/78  Objective:  Vital signs in last 24 hours:  Temp:  [98 F (36.7 C)-99.4 F (37.4 C)] 99.4 F (37.4 C) (05/07 0807) Pulse Rate:  [71-74] 71 (05/07 0807) Resp:  [16-18] 18 (05/07 0807) BP: (146-162)/(65-78) 162/78 (05/07 0807) SpO2:  [96 %-100 %] 100 % (05/07 0807)  Weight change:  Filed Weights   10/23/20 1348 10/23/20 1352  Weight: 70.3 kg 70.3 kg    Intake/Output: I/O last 3 completed shifts: In: 8413 [P.O.:240; I.V.:1347] Out: -    Intake/Output this shift:  Total I/O In: 240 [P.O.:240] Out: -   Physical Exam: General: NAD  Head: Normocephalic, atraumatic. Moist oral mucosal membranes  Eyes: Anicteric  Lungs:  Clear to auscultation  Heart: Regular rate and rhythm  Abdomen:  Soft, nontender,   Extremities:  no peripheral edema.  Neurologic: Nonfocal, moving all four extremities  Skin: No lesions       Basic Metabolic Panel: Recent Labs  Lab 10/23/20 1449 10/24/20 0458 10/25/20 0619 10/26/20 0450 10/27/20 0458  NA 143 142 142 142 141  K 3.4* 3.1* 3.2* 3.4* 3.3*  CL 107 109 107 108 109  CO2 25 25 26 25 26   GLUCOSE 147* 93 111* 112* 93  BUN 43* 38* 27* 22 20  CREATININE 3.56* 2.98* 2.35* 2.05* 1.91*  CALCIUM 9.0 8.3* 8.9 8.7* 8.7*  MG  --  1.5* 1.7 2.0 1.7  PHOS  --   --  3.0 2.5  --     Liver Function Tests: Recent Labs  Lab 10/22/20 1452 10/23/20 1449 10/24/20 0458 10/25/20 0619 10/26/20 0450  AST 30 31 23   --   --   ALT 31 28 22   --   --   ALKPHOS 75 58 54  --   --   BILITOT <0.2 0.7 0.5  --   --   PROT 6.5 7.1 6.0*  --   --   ALBUMIN 4.0 3.6 3.2* 3.4* 3.3*   No results for input(s): LIPASE, AMYLASE in the last 168 hours. No results for input(s): AMMONIA in the last 168 hours.  CBC: Recent Labs  Lab 10/22/20 1452 10/23/20 1449 10/24/20 0458 10/25/20 0619 10/26/20 0450 10/27/20 0458  WBC 6.5 5.6 5.6 5.1 5.0 5.2  NEUTROABS 4.0  --   --    --   --   --   HGB 9.8* 9.9* 8.9* 9.3* 9.2* 9.1*  HCT 30.9* 32.1* 28.7* 29.7* 30.1* 29.3*  MCV 77* 78.9* 78.0* 78.4* 78.6* 78.8*  PLT 233 218 206 205 191 177    Cardiac Enzymes: Recent Labs  Lab 10/22/20 1452 10/23/20 1449 10/25/20 0619  CKTOTAL 770* 652* 406*    BNP: Invalid input(s): POCBNP  CBG: Recent Labs  Lab 10/26/20 1126 10/26/20 1729 10/26/20 2131 10/27/20 0811 10/27/20 1135  GLUCAP 160* 110* 138* 90 125*    Microbiology: Results for orders placed or performed during the hospital encounter of 10/23/20  SARS CORONAVIRUS 2 (TAT 6-24 HRS) Nasopharyngeal Nasopharyngeal Swab     Status: None   Collection Time: 10/23/20  4:02 PM   Specimen: Nasopharyngeal Swab  Result Value Ref Range Status   SARS Coronavirus 2 NEGATIVE NEGATIVE Final    Comment: (NOTE) SARS-CoV-2 target nucleic acids are NOT DETECTED.  The SARS-CoV-2 RNA is generally detectable in upper and lower respiratory specimens during the acute phase of infection. Negative results do not preclude SARS-CoV-2 infection, do not  rule out co-infections with other pathogens, and should not be used as the sole basis for treatment or other patient management decisions. Negative results must be combined with clinical observations, patient history, and epidemiological information. The expected result is Negative.  Fact Sheet for Patients: SugarRoll.be  Fact Sheet for Healthcare Providers: https://www.woods-mathews.com/  This test is not yet approved or cleared by the [place] FDA and  has been authorized for detection and/or diagnosis of SARS-CoV-2 by FDA under an Emergency Use Authorization (EUA). This EUA will remain  in effect (meaning this test can be used) for the duration of the COVID-19 declaration under Se ction 564(b)(1) of the Act, 21 U.S.C. section 360bbb-3(b)(1), unless the authorization is terminated or revoked sooner.  Performed at [facility], [place] [address], [place], [place] [address]   Urine Culture     Status: Abnormal   Collection Time: 10/24/20  1:30 PM   Specimen: Urine, Clean Catch  Result Value Ref Range Status   Specimen Description   Final    URINE, CLEAN CATCH Performed at [facility], [address], [place], [place] [address]    Special Requests   Final    NONE Performed at [facility], [address], [place], [place] [address]    Culture (A)  Final    <10,000 COLONIES/mL INSIGNIFICANT GROWTH Performed at [facility], [place] [address] [place], [place] [address]    Report Status 10/25/2020 FINAL  Final    Coagulation Studies: No results for input(s): LABPROT, INR in the last 72 hours.  Urinalysis: No results for input(s): COLORURINE, LABSPEC, PHURINE, GLUCOSEU, HGBUR, BILIRUBINUR, KETONESUR, PROTEINUR, UROBILINOGEN, NITRITE, LEUKOCYTESUR in the last 72 hours.  Invalid input(s): APPERANCEUR    Imaging: No results found.   Medications:    . cloNIDine  0.1 mg Oral BID  . ferrous sulfate  325 mg Oral BID WC  . heparin  5,000 Units Subcutaneous Q8H  . irbesartan  300 mg Oral Daily  . magnesium chloride  1 tablet Oral BID  . metoprolol succinate  50 mg Oral Q breakfast  . pantoprazole  40 mg Oral Daily  . potassium chloride  40 mEq Oral Once   acetaminophen **OR** acetaminophen  Assessment/ Plan:  [name] is a 80 y.o.  female with past medical history of dyslipidemia, and hypertension. She presented to ED after receiving a call from her primary care physician about abnormal labs and was told to come to the ED.   1. Acute Kidney Injury likely due to dehydration with baseline creatinine 0.91  on 08/24/20. Normal GFR. History of proteinuria.  Creatinine improved to 1.91 - encourage PO intakes.    Lab Results  Component Value Date   CREATININE 1.91 (H) 10/27/2020   CREATININE 2.05 (H) 10/26/2020   CREATININE 2.35 (H) 10/25/2020     Intake/Output Summary (Last 24 hours) at 10/27/2020 1213 Last data filed at 10/27/2020 1030 Gross per 24 hour  Intake 1587 ml  Output --  Net 1587 ml   2. Hypertension: not well controlled. Off hydrochlorothiazide.  Continue clonidine and metoprolol - Start irbesartan today.   3. Diabetes mellitus type II with renal manifestations.  noninsulin dependent. Home regimen includes Metformin.  Most recent hemoglobin A1c is 7.8 on 10/23/20.  - Continue to hold metformin  4. Hypokalemia likely due HCTZ depletion - ARB as above.      LOS: 4 Tina Mcdonald 5/7/202212:13 PM

## 2020-10-27 NOTE — Progress Notes (Signed)
AVS reviewed with the pt, all questions answered. Discussed medications, new script sent to [facility], f/u appointments, activity, and diet. Iv dc'd tip intact. Pt waiting for her ride home.

## 2020-10-29 ENCOUNTER — Telehealth: Payer: Self-pay

## 2020-10-29 NOTE — Telephone Encounter (Signed)
Transition Care Management Unsuccessful Follow-up Telephone Call  Date of discharge and from where:  10/27/2020 [facility]  Attempts:  1st Attempt  Reason for unsuccessful TCM follow-up call:  Unable to leave message

## 2020-10-29 NOTE — Telephone Encounter (Signed)
Please call her house phone [phone] / pt returned call

## 2020-10-29 NOTE — Telephone Encounter (Signed)
Transition Care Management Follow-up Telephone Call  Date of discharge and from where: 10/27/2020 [facility]  How have you been since you were released from the hospital? Feeling good, had a BM  Any questions or concerns? No  Items Reviewed:  Did the pt receive and understand the discharge instructions provided? Yes   Medications obtained and verified? Yes   Other? No   Any new allergies since your discharge? No   Dietary orders reviewed? Yes  Do you have support at home? Yes   Home Care and Equipment/Supplies: Were home health services ordered? not applicable If so, what is the name of the agency? n/a  Has the agency set up a time to come to the patient's home? not applicable Were any new equipment or medical supplies ordered?  No What is the name of the medical supply agency? n/a Were you able to get the supplies/equipment? not applicable Do you have any questions related to the use of the equipment or supplies? No  Functional Questionnaire: (I = Independent and D = Dependent) ADLs: I  Bathing/Dressing- I  Meal Prep- I  Eating- I  Maintaining continence- I  Transferring/Ambulation- I  Managing Meds- I  Follow up appointments reviewed:   PCP Hospital f/u appt confirmed? Yes  Scheduled to see Vance Peper on 11/01/2020 @ 11:00.  Are transportation arrangements needed? No   If their condition worsens, is the pt aware to call PCP or go to the Emergency Dept.? Yes  Was the patient provided with contact information for the PCP's office or ED? Yes  Was to pt encouraged to call back with questions or concerns? Yes

## 2020-11-01 ENCOUNTER — Other Ambulatory Visit: Payer: Self-pay

## 2020-11-01 ENCOUNTER — Ambulatory Visit (INDEPENDENT_AMBULATORY_CARE_PROVIDER_SITE_OTHER): Payer: Medicare Other | Admitting: Nurse Practitioner

## 2020-11-01 ENCOUNTER — Encounter: Payer: Self-pay | Admitting: Nurse Practitioner

## 2020-11-01 ENCOUNTER — Telehealth: Payer: Self-pay

## 2020-11-01 VITALS — BP 134/71 | HR 78 | Temp 98.4°F | Ht 62.01 in | Wt 161.2 lb

## 2020-11-01 DIAGNOSIS — N3941 Urge incontinence: Secondary | ICD-10-CM

## 2020-11-01 DIAGNOSIS — E1159 Type 2 diabetes mellitus with other circulatory complications: Secondary | ICD-10-CM

## 2020-11-01 DIAGNOSIS — N179 Acute kidney failure, unspecified: Secondary | ICD-10-CM

## 2020-11-01 DIAGNOSIS — E785 Hyperlipidemia, unspecified: Secondary | ICD-10-CM | POA: Diagnosis not present

## 2020-11-01 DIAGNOSIS — N1831 Chronic kidney disease, stage 3a: Secondary | ICD-10-CM

## 2020-11-01 DIAGNOSIS — E1169 Type 2 diabetes mellitus with other specified complication: Secondary | ICD-10-CM | POA: Diagnosis not present

## 2020-11-01 DIAGNOSIS — E1122 Type 2 diabetes mellitus with diabetic chronic kidney disease: Secondary | ICD-10-CM

## 2020-11-01 DIAGNOSIS — I152 Hypertension secondary to endocrine disorders: Secondary | ICD-10-CM

## 2020-11-01 NOTE — Assessment & Plan Note (Addendum)
Improved. Likely related to HCTZ, dehydration, and possible progression of CKD. Continue collaboration and follow-up with nephrology. She is going to call and make an appointment next week for hospital follow-up. Keep her next appointment June 6 with her PCP

## 2020-11-01 NOTE — Assessment & Plan Note (Signed)
Chronic. Had stopped rosuvastatin due to AKI and elevated CK. She re-started this when she got discharged from the hospital. Will re-check CK next visit.

## 2020-11-01 NOTE — Progress Notes (Signed)
Established Patient Office Visit  Subjective:  Patient ID: Tina Mcdonald, female    DOB: 10-27-1940  Age: 80 y.o. MRN: [id number]  CC:  Chief Complaint  Patient presents with  . Hospitalization Follow-up    Was in Hospital on 10/23/20 and was released on 10/27/20 for Acute Kidney Injury. Patient states that she never felt bad before going in to hospital.   Patient would like to know if she should continue taking there Mirbetriq that was prescribed, she finished that prescription    HPI Tina Mcdonald presents for hospital follow-up for acute kidney injury. She was admitted on 10/23/20 and discharged on 10/27/20. Discharge summary included the following from Dr. Billie Ruddy:  "Admit date: 10/23/2020 Discharge date: 10/27/2020  Admitted From: home Disposition:  home CODE STATUS: Full code      Discharge Instructions    Discharge instructions   Complete by: As directed    You had acute kidney injury due to dehydration.  Your hydrochlorothiazide was discontinued.  You are prescribed a new blood pressure medication irbesartan.    Please follow up with your outpatient doctor to monitor your kidney function and blood pressure.   Dr. Enzo Bi [facility] Course:  For full details, please see H&P, progress notes, consult notes and ancillary notes.  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   PCP attempted to contact patient to ask her to go the ED however she was out of town. She did go to the ED but left before she could be seen last week. Patient notes the only medication changes she has had has been per her PCP which have included addition of oxybutynin,holding of metformin and HCTZ and  gabapentinandchanging of her statins due to intermittent fatigue.  1 acute kidney injury on chronic kidney disease stage IIIa Cr 3.45 on presentation.  likely due to dehydration and HCTZ use. Renal ultrasound consistent with medical renal disease, no obstruction. CK elevated at770>>652, but not high enough for concerns for rhabdomyolysis.  -Renal function slowly improving with hydration, was 1.91 prior to discharge. --nephrology consulted --SPEP, UPEP, ANCA, and ANAnot significant. --Pt will follow up with Nephro Dr. Holley Raring as outpatient.  2. Elevated CK -Patient denies any significant myalgias or injuries.  Down-trended with IVF.  3. Type 2 diabetes mellitus, well controlled -Noted to have been on metformin prior to admission  -Hemoglobin A1c 6.5 (08/24/2020).  4. Hypertension -Continue clonidine and home Toprol.  HCTZ was d/c'ed by pt's PCP prior to presentation.  Pt was started on irbesartanby nephrology.  5. Hyperlipidemia -Statin held while inpatient, resumed at discharge.  6. Hypokalemia --monitor and replete PRN  hypomagnesemia --cont oral mag scheduled --replete with IV mag PRN  7. GERD -PPI.  8. 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 --anemia workup showed iron, folate and vit B12 all wnl   Discharge Diagnoses:  Principal Problem:   AKI (acute kidney injury) ([place]) Active Problems:   Type 2 diabetes mellitus with chronic kidney disease ([place])   Hypertension associated with diabetes ([place])   Hyperlipidemia associated with type 2 diabetes mellitus ([place])   Diabetic neuropathy ([place])   GERD (gastroesophageal reflux disease)"   Transition of Care Hospital Follow up.  Hospital/Facility: [facility] D/C Physician:  Dr. Enzo Bi D/C Date: 10/27/20  Records Requested:  11/01/20 Records Received:  11/01/20 Records Reviewed:  11/01/20  Diagnoses on Discharge:  Acute kidney injury  Date of interactive Contact within 48 hours of discharge: 10/29/20 Contact was  through: phone  Date of 7 day or 14 day face-to-face visit:    within 7 days  Outpatient Encounter Medications as of 11/01/2020  Medication Sig  . acetaminophen (TYLENOL) 325 MG tablet Take 650 mg by mouth every 6 (six) hours as needed.  . blood glucose meter kit and supplies KIT Dispense based on patient and insurance preference. Use up to four times daily as directed. (FOR ICD-9 250.00, 250.01).  . cloNIDine (CATAPRES) 0.1 MG tablet Take 0.1 mg by mouth 2 (two) times daily.  . diclofenac sodium (VOLTAREN) 1 % GEL Apply 2 g topically 2 (two) times daily as needed.  . Ferrous Sulfate (SLOW FE PO) Take 1 tablet by mouth daily.  [name] gabapentin (NEURONTIN) 100 MG capsule Take 2 capsules (200 mg total) by mouth at bedtime.  [name] glucose blood test strip Check daily. ICD 10 E11.9  . irbesartan (AVAPRO) 300 MG tablet Take 1 tablet (300 mg total) by mouth daily. New blood pressure medicine.  . magnesium chloride (SLOW-MAG) 64 MG TBEC SR tablet Take 1 tablet (64 mg total) by mouth 2 (two) times daily.  . metFORMIN (GLUCOPHAGE-XR) 500 MG 24 hr tablet Take 1 tablet (500 mg total) by mouth 2 (two) times daily.  . metoprolol succinate (TOPROL-XL) 50 MG 24 hr tablet Take 1 tablet (50 mg total) by mouth daily. Take with or immediately following a meal.  . mirabegron ER (MYRBETRIQ) 50 MG TB24 tablet Take 50 mg by mouth daily.  [name] omeprazole (PRILOSEC) 20 MG capsule TAKE 1 CAPSULE BY MOUTH  DAILY  . rosuvastatin (CRESTOR) 40 MG tablet Take 1 tablet (40 mg total) by mouth daily. Stop Atorvastatin daily.  . vitamin C (ASCORBIC ACID) 250 MG tablet Take 1 tablet (250 mg total) by mouth daily.   Facility-Administered Encounter Medications as of 11/01/2020  Medication  . cyanocobalamin ((VITAMIN B-12)) injection 1,000 mcg    Diagnostic Tests Reviewed/Disposition:  Reviewed on chart  Consults: nephrology  Discharge Instructions As above  Disease/illness Education: Educated today on acute illness  Home  Health/Community Services Discussions/Referrals: None  Establishment or re-establishment of referral orders for community resources: None  Discussion with other health care providers: Reviewed notes  Assessment and Support of treatment regimen adherence: Reviewed with patient today  Appointments Coordinated with: Reviewed with patient today--will keep routine outpatient follow-up with nephrology  Education for self-management, independent living, and ADLs:  Reviewed with patient today   Past Medical History:  Diagnosis Date  . Anemia   . Arthritis   . Chronic kidney disease   . Colon polyps   . Diabetes mellitus without complication ([place])   . Diabetic neuropathy ([place])   . Dyspnea   . GERD (gastroesophageal reflux disease)   . Hyperlipemia   . Hypertension   . Osteoarthritis   . Radiculopathy    weakness in right leg  . Sleep apnea    does not have her cpap anymore  . Stress incontinence   . Urticaria   . Vitamin B 12 deficiency   . Wears glasses     Past Surgical History:  Procedure Laterality Date  . ABDOMINAL HYSTERECTOMY    . APPENDECTOMY    . BREAST BIOPSY Right    right-neg  .  BREAST BIOPSY Left    us/bx/clip-neg  . CARPAL TUNNEL RELEASE     right  . COLONOSCOPY    . COLONOSCOPY WITH PROPOFOL N/A 12/10/2015   Procedure: COLONOSCOPY WITH PROPOFOL;  Surgeon: Lucilla Lame, MD;  Location: [facility];  Service: Endoscopy;  Laterality: N/A;  Diabetic - oral meds Sleep Apnea LATEX allergy  . COLONOSCOPY WITH PROPOFOL N/A 01/17/2019   Procedure: COLONOSCOPY WITH PROPOFOL;  Surgeon: Virgel Manifold, MD;  Location: [facility];  Service: Endoscopy;  Laterality: N/A;  . CYSTOSCOPY    . DILATION AND CURETTAGE OF UTERUS    . ESOPHAGOGASTRODUODENOSCOPY (EGD) WITH PROPOFOL N/A 01/17/2019   Procedure: ESOPHAGOGASTRODUODENOSCOPY (EGD) WITH PROPOFOL;  Surgeon: Virgel Manifold, MD;  Location: [facility];  Service: Endoscopy;  Laterality: N/A;   . EYE SURGERY Bilateral    lens implant  . GIVENS CAPSULE STUDY N/A 05/04/2019   Procedure: GIVENS CAPSULE STUDY;  Surgeon: Virgel Manifold, MD;  Location: [facility];  Service: Endoscopy;  Laterality: N/A;  . LUMBAR FUSION  2009  . POLYPECTOMY  12/10/2015   Procedure: POLYPECTOMY;  Surgeon: Lucilla Lame, MD;  Location: [facility];  Service: Endoscopy;;  . POLYPECTOMY  01/17/2019   Procedure: POLYPECTOMY;  Surgeon: Virgel Manifold, MD;  Location: [facility];  Service: Endoscopy;;  . RESECTION DISTAL CLAVICAL Right 11/25/2013   Procedure: RIGHT SHOULDER OPEN RESECTION DISTAL CLAVICAL EXCISION SOFT TISSUE TUMOR SHOULDER DEEP SUBFASCIAL INTRAMUSCULAR/DEBRIDEMENT/ROTATOR CUFF REPAIR/BICEPS TENODESIS/MANIPULATION;  Surgeon: Renette Butters, MD;  Location: [facility];  Service: Orthopedics;  Laterality: Right;  . SHOULDER ARTHROSCOPY WITH ROTATOR CUFF REPAIR AND SUBACROMIAL DECOMPRESSION Right 11/25/2013   Procedure: SHOULDER ARTHROSCOPY WITH DEBRIDEMENT ROTATOR CUFF REPAIR  A;  Surgeon: Renette Butters, MD;  Location: [facility];  Service: Orthopedics;  Laterality: Right;    Family History  Problem Relation Age of Onset  . Heart disease Mother   . Diabetes Mother   . Hypertension Mother   . Heart disease Father   . Hypertension Father   . Diabetes Sister   . Hypertension Sister   . Heart disease Sister   . Kidney disease Sister   . Diabetes Sister   . Hypertension Sister   . Breast cancer Cousin 61  . Non-Hodgkin's lymphoma Maternal Uncle     Social History   Socioeconomic History  . Marital status: Single    Spouse name: Not on file  . Number of children: Not on file  . Years of education: Not on file  . Highest education level: Some college, no degree  Occupational History  . Occupation: retired  Tobacco Use  . Smoking status: Former Smoker    Packs/day: 1.00    Years: 15.00    Pack years: 15.00    Quit date:  11/22/1997    Years since quitting: 22.9  . Smokeless tobacco: Never Used  Vaping Use  . Vaping Use: Never used  Substance and Sexual Activity  . Alcohol use: No  . Drug use: No  . Sexual activity: Not Currently  Other Topics Concern  . Not on file  Social History Narrative  . Not on file   Social Determinants of Health   Financial Resource Strain: Low Risk   . Difficulty of Paying Living Expenses: Not hard at all  Food Insecurity: No Food Insecurity  . Worried About Charity fundraiser in the Last Year: Never true  . Ran Out of Food in the Last Year:  Never true  Transportation Needs: No Transportation Needs  . Lack of Transportation (Medical): No  . Lack of Transportation (Non-Medical): No  Physical Activity: Inactive  . Days of Exercise per Week: 0 days  . Minutes of Exercise per Session: 0 min  Stress: No Stress Concern Present  . Feeling of Stress : Not at all  Social Connections: Unknown  . Frequency of Communication with Friends and Family: More than three times a week  . Frequency of Social Gatherings with Friends and Family: More than three times a week  . Attends Religious Services: More than 4 times per year  . Active Member of Clubs or Organizations: Yes  . Attends Archivist Meetings: More than 4 times per year  . Marital Status: Not on file  Intimate Partner Violence: Not At Risk  . Fear of Current or Ex-Partner: No  . Emotionally Abused: No  . Physically Abused: No  . Sexually Abused: No    Outpatient Medications Prior to Visit  Medication Sig Dispense Refill  . acetaminophen (TYLENOL) 325 MG tablet Take 650 mg by mouth every 6 (six) hours as needed.    . blood glucose meter kit and supplies KIT Dispense based on patient and insurance preference. Use up to four times daily as directed. (FOR ICD-9 250.00, 250.01). 1 each 0  . cloNIDine (CATAPRES) 0.1 MG tablet Take 0.1 mg by mouth 2 (two) times daily.    . diclofenac sodium (VOLTAREN) 1 % GEL Apply  2 g topically 2 (two) times daily as needed. 1 Tube 2  . Ferrous Sulfate (SLOW FE PO) Take 1 tablet by mouth daily.    [name] gabapentin (NEURONTIN) 100 MG capsule Take 2 capsules (200 mg total) by mouth at bedtime.    [name] glucose blood test strip Check daily. ICD 10 E11.9 100 each 12  . irbesartan (AVAPRO) 300 MG tablet Take 1 tablet (300 mg total) by mouth daily. New blood pressure medicine. 30 tablet 2  . magnesium chloride (SLOW-MAG) 64 MG TBEC SR tablet Take 1 tablet (64 mg total) by mouth 2 (two) times daily. 60 tablet 4  . metFORMIN (GLUCOPHAGE-XR) 500 MG 24 hr tablet Take 1 tablet (500 mg total) by mouth 2 (two) times daily. 180 tablet 4  . metoprolol succinate (TOPROL-XL) 50 MG 24 hr tablet Take 1 tablet (50 mg total) by mouth daily. Take with or immediately following a meal. 90 tablet 4  . mirabegron ER (MYRBETRIQ) 50 MG TB24 tablet Take 50 mg by mouth daily.    [name] omeprazole (PRILOSEC) 20 MG capsule TAKE 1 CAPSULE BY MOUTH  DAILY 90 capsule 3  . rosuvastatin (CRESTOR) 40 MG tablet Take 1 tablet (40 mg total) by mouth daily. Stop Atorvastatin daily. 90 tablet 4  . vitamin C (ASCORBIC ACID) 250 MG tablet Take 1 tablet (250 mg total) by mouth daily.     Facility-Administered Medications Prior to Visit  Medication Dose Route Frequency Provider Last Rate Last Admin  . cyanocobalamin ((VITAMIN B-12)) injection 1,000 mcg  1,000 mcg Intramuscular Q30 days Kathrine Haddock, NP   1,000 mcg at 08/17/20 1022    Allergies  Allergen Reactions  . Contrast Media [Iodinated Diagnostic Agents] Itching  . Penicillins Itching    Has patient had a PCN reaction causing immediate rash, facial/tongue/throat swelling, SOB or lightheadedness with hypotension: Yes Has patient had a PCN reaction causing severe rash involving mucus membranes or skin necrosis: Unknown Has patient had a PCN reaction that required hospitalization: Yes Has  patient had a PCN reaction occurring within the last 10 years: Yes If all of the  above answers are "NO", then may proceed with Cephalosporin use.  . Sulfa Antibiotics Itching  . Latex Rash  . Neosporin [Neomycin-Bacitracin Zn-Polymyx] Itching and Rash  . Other Rash    Pt has reaction to high acidity foods.     ROS Review of Systems  Constitutional: Positive for fatigue. Negative for fever.  HENT: Negative.   Eyes: Negative.   Respiratory: Positive for shortness of breath (intermittent).   Cardiovascular: Negative.   Gastrointestinal: Negative.   Endocrine: Negative.   Genitourinary: Positive for frequency and urgency. Negative for dysuria.  Musculoskeletal: Negative.   Skin: Negative.   Neurological: Negative.   Psychiatric/Behavioral: Negative.       Objective:    Physical Exam Vitals and nursing note reviewed.  Constitutional:      General: She is not in acute distress.    Appearance: Normal appearance.  HENT:     Head: Normocephalic and atraumatic.  Eyes:     Conjunctiva/sclera: Conjunctivae normal.  Cardiovascular:     Rate and Rhythm: Normal rate and regular rhythm.     Pulses: Normal pulses.     Heart sounds: Normal heart sounds.  Pulmonary:     Effort: Pulmonary effort is normal.     Breath sounds: Normal breath sounds.  Musculoskeletal:     Cervical back: Normal range of motion.     Right lower leg: No edema.     Left lower leg: No edema.  Skin:    General: Skin is warm and dry.  Neurological:     General: No focal deficit present.     Mental Status: She is alert and oriented to person, place, and time.  Psychiatric:        Mood and Affect: Mood normal.        Behavior: Behavior normal.        Thought Content: Thought content normal.        Judgment: Judgment normal.     BP 134/71   Pulse 78   Temp 98.4 F (36.9 C) (Oral)   Ht 5' 2.01" (1.575 m)   Wt 161 lb 3.2 oz (73.1 kg)   LMP  (LMP Unknown)   SpO2 98%   BMI 29.48 kg/m  Wt Readings from Last 3 Encounters:  11/01/20 161 lb 3.2 oz (73.1 kg)  10/23/20 155 lb (70.3  kg)  10/18/20 156 lb 12.8 oz (71.1 kg)     There are no preventive care reminders to display for this patient.  There are no preventive care reminders to display for this patient.  Lab Results  Component Value Date   TSH 0.666 08/24/2020   Lab Results  Component Value Date   WBC 5.2 10/27/2020   HGB 9.1 (L) 10/27/2020   HCT 29.3 (L) 10/27/2020   MCV 78.8 (L) 10/27/2020   PLT 177 10/27/2020   Lab Results  Component Value Date   NA 141 10/27/2020   K 3.3 (L) 10/27/2020   CO2 26 10/27/2020   GLUCOSE 93 10/27/2020   BUN 20 10/27/2020   CREATININE 1.91 (H) 10/27/2020   BILITOT 0.5 10/24/2020   ALKPHOS 54 10/24/2020   AST 23 10/24/2020   ALT 22 10/24/2020   PROT 6.0 (L) 10/24/2020   ALBUMIN 3.3 (L) 10/26/2020   CALCIUM 8.7 (L) 10/27/2020   ANIONGAP 6 10/27/2020   EGFR 13 (L) 10/22/2020   Lab Results  Component Value Date  CHOL 193 08/24/2020   Lab Results  Component Value Date   HDL 50 08/24/2020   Lab Results  Component Value Date   LDLCALC 126 (H) 08/24/2020   Lab Results  Component Value Date   TRIG 94 08/24/2020   No results found for: CHOLHDL Lab Results  Component Value Date   HGBA1C 7.8 (H) 10/23/2020      Assessment & Plan:   Problem List Items Addressed This Visit      Cardiovascular and Mediastinum   Hypertension associated with diabetes ([place])    Chronic. Blood pressure controlled today. With recent AKI, hctz was discontinued and she was started on irbesartan. She has not had any side effects from this. Continue current regimen. Check BMP today.        Endocrine   Type 2 diabetes mellitus with chronic kidney disease ([place])   Hyperlipidemia associated with type 2 diabetes mellitus ([facility])    Chronic. Had stopped rosuvastatin due to AKI and elevated CK. She re-started this when she got discharged from the hospital. Will re-check CK next visit.         Genitourinary   AKI (acute kidney injury) ([place]) - Primary    Improved. Likely related  to HCTZ, dehydration, and possible progression of CKD. Continue collaboration and follow-up with nephrology. She is going to call and make an appointment next week for hospital follow-up. Keep her next appointment June 6 with her PCP      Relevant Orders   Basic Metabolic Panel (BMET)     Other   Urge incontinence    Had stopped mirbetriq and has had no issues with incontinence if she is able to get to the bathroom right away. She is out of this medication, but would like to hold off on a refill at this time. She has an appointment with urology next week and she was encouraged to keep this visit. She can call in as well if she would like [place] to send in a refill.          No orders of the defined types were placed in this encounter.   Follow-up: Return if symptoms worsen or fail to improve, for keep appointment in June with Jolene.    Charyl Dancer, NP

## 2020-11-01 NOTE — Assessment & Plan Note (Signed)
Chronic. Blood pressure controlled today. With recent AKI, hctz was discontinued and she was started on irbesartan. She has not had any side effects from this. Continue current regimen. Check BMP today.

## 2020-11-01 NOTE — Assessment & Plan Note (Signed)
Had stopped mirbetriq and has had no issues with incontinence if she is able to get to the bathroom right away. She is out of this medication, but would like to hold off on a refill at this time. She has an appointment with urology next week and she was encouraged to keep this visit. She can call in as well if she would like [place] to send in a refill.

## 2020-11-01 NOTE — Telephone Encounter (Signed)
Copied from [place] [phone]. Topic: Referral - Question >> Nov 01, 2020  3:24 PM Loma [place] wrote: Pt saw Lauren today, was told  to make an  appt with Nephrology at [facility]. Pt  Got Tenative June 2 date but was told if dr can send over lab reports from today can bump up to sooner [phone]. PT states  This is a  Public relations account executive number so  press 2 for [place] which is where pt wants to go, does not have fax #    Pt number is [phone]

## 2020-11-01 NOTE — Patient Instructions (Addendum)
It was great to see you!  Flonase can help with fluid behind your ear. Call your kidney doctor to set up an appointment  Take care,  Vance Peper, NP

## 2020-11-02 LAB — BASIC METABOLIC PANEL
BUN/Creatinine Ratio: 13 (ref 12–28)
BUN: 20 mg/dL (ref 8–27)
CO2: 19 mmol/L — ABNORMAL LOW (ref 20–29)
Calcium: 9.9 mg/dL (ref 8.7–10.3)
Chloride: 104 mmol/L (ref 96–106)
Creatinine, Ser: 1.56 mg/dL — ABNORMAL HIGH (ref 0.57–1.00)
Glucose: 164 mg/dL — ABNORMAL HIGH (ref 65–99)
Potassium: 4.3 mmol/L (ref 3.5–5.2)
Sodium: 144 mmol/L (ref 134–144)
eGFR: 34 mL/min/{1.73_m2} — ABNORMAL LOW (ref >59)

## 2020-11-02 NOTE — Telephone Encounter (Signed)
Lab results faxed as requested.

## 2020-11-05 ENCOUNTER — Encounter (INDEPENDENT_AMBULATORY_CARE_PROVIDER_SITE_OTHER): Payer: Medicare Other | Admitting: Ophthalmology

## 2020-11-05 DIAGNOSIS — Z87898 Personal history of other specified conditions: Secondary | ICD-10-CM | POA: Insufficient documentation

## 2020-11-08 ENCOUNTER — Encounter (INDEPENDENT_AMBULATORY_CARE_PROVIDER_SITE_OTHER): Payer: Medicare Other | Admitting: Ophthalmology

## 2020-11-08 ENCOUNTER — Other Ambulatory Visit: Payer: Self-pay

## 2020-11-08 DIAGNOSIS — H43823 Vitreomacular adhesion, bilateral: Secondary | ICD-10-CM

## 2020-11-08 DIAGNOSIS — I10 Essential (primary) hypertension: Secondary | ICD-10-CM | POA: Diagnosis not present

## 2020-11-08 DIAGNOSIS — H35033 Hypertensive retinopathy, bilateral: Secondary | ICD-10-CM | POA: Diagnosis not present

## 2020-11-08 DIAGNOSIS — E113293 Type 2 diabetes mellitus with mild nonproliferative diabetic retinopathy without macular edema, bilateral: Secondary | ICD-10-CM

## 2020-11-08 DIAGNOSIS — H43813 Vitreous degeneration, bilateral: Secondary | ICD-10-CM | POA: Diagnosis not present

## 2020-11-12 ENCOUNTER — Ambulatory Visit: Payer: Medicare Other | Admitting: Urology

## 2020-11-13 ENCOUNTER — Other Ambulatory Visit: Payer: Self-pay | Admitting: Nurse Practitioner

## 2020-11-13 ENCOUNTER — Ambulatory Visit (INDEPENDENT_AMBULATORY_CARE_PROVIDER_SITE_OTHER): Payer: Medicare Other

## 2020-11-13 ENCOUNTER — Encounter: Payer: Self-pay | Admitting: General Practice

## 2020-11-13 ENCOUNTER — Encounter: Payer: Medicare Other | Admitting: General Practice

## 2020-11-13 DIAGNOSIS — I152 Hypertension secondary to endocrine disorders: Secondary | ICD-10-CM | POA: Diagnosis not present

## 2020-11-13 DIAGNOSIS — E1169 Type 2 diabetes mellitus with other specified complication: Secondary | ICD-10-CM

## 2020-11-13 DIAGNOSIS — E785 Hyperlipidemia, unspecified: Secondary | ICD-10-CM | POA: Diagnosis not present

## 2020-11-13 DIAGNOSIS — N179 Acute kidney failure, unspecified: Secondary | ICD-10-CM

## 2020-11-13 DIAGNOSIS — N183 Chronic kidney disease, stage 3 unspecified: Secondary | ICD-10-CM | POA: Diagnosis not present

## 2020-11-13 DIAGNOSIS — E1159 Type 2 diabetes mellitus with other circulatory complications: Secondary | ICD-10-CM

## 2020-11-13 DIAGNOSIS — E1122 Type 2 diabetes mellitus with diabetic chronic kidney disease: Secondary | ICD-10-CM | POA: Diagnosis not present

## 2020-11-13 MED ORDER — IRBESARTAN 300 MG PO TABS: 300.0000 mg | ORAL_TABLET | Freq: Every day | ORAL | 4 refills | Status: DC

## 2020-11-13 NOTE — Progress Notes (Signed)
Error in charting see new enconter

## 2020-11-13 NOTE — Progress Notes (Signed)
This encounter was created in error - please disregard.

## 2020-11-13 NOTE — Patient Instructions (Signed)
Visit Information  PATIENT GOALS: Goals Addressed            This Visit's Progress   . COMPLETED: Increase water intake       Recommend drinking at least 6-7 glasses of water a day       Patient Care Plan: RNCM: HLD    Problem Identified: RNCM: HLD   Priority: Medium    Long-Range Goal: RNCM: HLD   Priority: Medium  Note:   Current Barriers:  . Poorly controlled hyperlipidemia, complicated by HTN . Current antihyperlipidemic regimen: atorvastatin 80 mg . Most recent lipid panel:  . Lab Results .  Component . Value . Date .   [name] CHOL . 193 . 08/24/2020 .   [name] CHOL . 183 . 04/13/2020 .   [name] CHOL . 185 . 12/02/2019 .   [name] Lab Results .  Component . Value . Date .   [name] HDL . 50 . 08/24/2020 .   [name] HDL . 46 . 04/13/2020 .   [name] HDL . 51 . 12/02/2019 .   [name] Lab Results .  Component . Value . Date .   [name] [place] . 126 (H) . 08/24/2020 .   [name] [place] . 115 (H) . 04/13/2020 .   [name] [place] . 121 (H) . 12/02/2019 .   [name] Lab Results .  Component . Value . Date .   [name] TRIG . 94 . 08/24/2020 .   [name] TRIG . 125 . 04/13/2020 .   [name] TRIG . 67 . 12/02/2019 .   [name] No results found for: CHOLHDL . No results found for: LDLDIRECT  . ASCVD risk enhancing conditions: age >85, DM, HTN, CKD, former smoker . Unable to independently manage HLD . Unable to self administer medications as prescribed . Lacks social connections . Does not contact provider office for questions/concerns  RN Care Manager Clinical Goal(s):  [name] Over the next 120 days, patient will work with Consulting civil engineer, providers, and care team towards execution of optimized self-health management plan . Over the next 120 days, patient will verbalize understanding of plan for effective management of HLD  . Over the next 120 days, patient will work with [facility] and pcp to address needs related to HLD  . Over the next 120 days, patient will attend all scheduled medical appointments: 11/26/2020 with PCP.  [place] appointment was done on  11/01/20  Interventions: . Collaboration with [name], NP regarding development and update of comprehensive plan of care as evidenced by provider attestation and co-signature . Inter-disciplinary care team collaboration (see longitudinal plan of care) . Medication review performed; medication list updated in electronic medical record.  Bertram Savin care team collaboration (see longitudinal plan of care) . Referred to pharmacy team for assistance with HLD medication management . Evaluation of current treatment plan related to HLD and patient's adherence to plan as established by provider. On 11/13/20 Reviewed diet, eating habits, sleep patterns . Advised patient to call the office for changes in condition or questions . Provided education to patient re: heart healthy diet, medication compliance, and pacing activity  . Reviewed scheduled/upcoming provider appointments including: Has an appointment on 11/26/2020 with PCP to talk about right sided rib and breast pain that she is experiencing since hospitalization. . Discussed plans with patient for ongoing care management follow up and provided patient with direct contact information for care management team  Patient Goals/Self-Care Activities: . Over the next 120 days, patient will:   -  call for medicine refill 2 or 3 days before it runs out - call if I am sick and can't take my medicine - keep a list of all the medicines I take; vitamins and herbals too - learn to read medicine labels - use a pillbox to sort medicine - use an alarm clock or phone to remind me to take my medicine - change to whole grain breads, cereal, pasta - drink 6 to 8 glasses of water each day - eat 3 to 5 servings of fruits and vegetables each day - eat 5 or 6 small meals each day - fill half the plate with nonstarchy vegetables - limit fast food meals to no more than 1 per week - manage portion size - prepare main meal at home 3 to 5 days each week -  read food labels for fat, fiber, carbohydrates and portion size - be open to making changes - I can manage, know and watch for signs of a heart attack - if I have chest pain, call for help - learn about small changes that will make a big difference - learn my personal risk factors - education plan reviewed and/or amended - empathy and reassurance conveyed - health literacy screen reviewed - patient's preferred learning methods utilized - privacy ensured - questions encouraged - readiness to learn monitored  Follow Up Plan: Telephone follow up appointment with care management team member scheduled for: 7/26/222 at 9:00am     Task: RNCM: HLD Management   Note:   Care Management Activities:    - education plan reviewed and/or amended - empathy and reassurance conveyed - health literacy screen reviewed - patient's preferred learning methods utilized - privacy ensured - questions encouraged - readiness to learn monitored       Patient Care Plan: RNCM: Hypertension (Adult)    Problem Identified: RNCM: Hypertension (Hypertension)   Priority: Medium    Long-Range Goal: RNCM: Hypertension Monitored   Priority: Medium  Note:   Objective:  . Last practice recorded BP readings:  . BP Readings from Last 3 Encounters: .  11/01/20 . 134/71 .  10/27/20 . (!) 162/78 .  10/18/20 . (!) 100/50 .    [name] Most recent eGFR/CrCl: No results found for: EGFR  No components found for: CRCL Current Barriers:  [name] Knowledge Deficits related to basic understanding of hypertension pathophysiology and self care management . Knowledge Deficits related to understanding of medications prescribed for management of hypertension . Limited Social Support . Does not attend all scheduled provider appointments . Lacks social connections . Does not contact provider office for questions/concerns Case Manager Clinical Goal(s):  [name] Over the next 120 days, patient will verbalize understanding of plan for  hypertension management . Over the next 120 days, patient will attend all scheduled medical appointments: 11-26-2020 . Over the next 120 days, patient will demonstrate improved adherence to prescribed treatment plan for hypertension as evidenced by taking all medications as prescribed, monitoring and recording blood pressure as directed, adhering to low sodium/DASH diet . Over the next 120 days, patient will demonstrate improved health management independence as evidenced by checking blood pressure as directed and notifying PCP if SBP>160 or DBP > 90, taking all medications as prescribe, and adhering to a low sodium diet as discussed. . Over the next 120 days, patient will verbalize basic understanding of hypertension disease process and self health management plan as evidenced by compliance with medications, heart healthy diet, and working with CCM team to manage health and well  being Interventions:  . Collaboration with [name], NP regarding development and update of comprehensive plan of care as evidenced by provider attestation and co-signature . Inter-disciplinary care team collaboration (see longitudinal plan of care) . Evaluation of current treatment plan related to hypertension self management and patient's adherence to plan as established by provider. 11/13/20 Discussed diet, activity level, sleep patterns, medication changes.  States the medications is working well.  Asked about a prescription for Irbesartan to be sent to [facility].  Advised pt to talk to PCP at upcoming visit. [name] Provided education to patient re: stroke prevention, s/s of heart attack and stroke, DASH diet, complications of uncontrolled blood pressure . Reviewed medications with patient and discussed importance of compliance . Discussed plans with patient for ongoing care management follow up and provided patient with direct contact information for care management team . Advised patient, providing education and rationale,  to monitor blood pressure daily and record, calling PCP for findings outside established parameters.  . Reviewed scheduled/upcoming provider appointments including: 11-26-2020 at 1020am Patient Goals/Self-Care Activities . Over the next 120 days, patient will:  - Self administers medications as prescribed Attends all scheduled provider appointments Calls provider office for new concerns, questions, or BP outside discussed parameters Checks BP and records as discussed Follows a low sodium diet/DASH diet - depression screen reviewed - home or ambulatory blood pressure monitoring encouraged Follow Up Plan: Telephone follow up appointment with care management team member scheduled for: 01-15-2021 at 900am   Task: RNCM: Identify and Monitor Blood Pressure Elevation   Note:   Care Management Activities:    - depression screen reviewed - home or ambulatory blood pressure monitoring encouraged       Patient Care Plan: RN CM Chronic Kidney (Adult) / AKI    Problem Identified: RNCM Adjustment to Chronic Kidney Disease / AKI   Priority: High    Long-Range Goal: RNCM Manangement of CKD/AKI   Priority: High  Note:   Current Barriers:  [name] Knowledge Deficits related to recent hospitalization for AKI . Chronic Disease Management support and education needs related to CKD / AKI . Unable to independently manage dehydration leading to hospitalization for AKI.  Nurse Case Manager Clinical Goal(s):  . patient will verbalize understanding of plan for effective management of CKD /AKI . patient will work with RN CM and PCP to address needs related to new onset of AKI and other renal concerns . patient will demonstrate a decrease in dehydration  exacerbations as evidenced by lab work WNL and redutcion in ED visits and hospitalizations. . patient will attend all scheduled medical appointments: 11-26-2020  with PCP  Interventions:  . 1:1 collaboration with [name], NP regarding development and  update of comprehensive plan of care as evidenced by provider attestation and co-signature . Inter-disciplinary care team collaboration (see longitudinal plan of care) . Evaluation of current treatment plan related to CKD/AKI and patient's adherence to plan as established by provider. . Advised patient to call the office if right sided pain worsens and for any other concerns. . Reviewed scheduled/upcoming provider appointments including: PCP visit on 11-26-2020   Patient Goals/Self-Care Activities  - - counseling provided - decision-making supported - positive reinforcement provided - problem-solving facilitated - self-care encouraged - verbalization of feelings encouraged   Follow Up Plan: Telephone follow up appointment with care management team member scheduled for:01-15-2021 at 900am The patient has been provided with contact information for the care management team and has been advised to call  with any health related questions or concerns.        Task: RNCM Management for CKD / AKI   Note:   Care Management Activities:    - counseling provided - decision-making supported - positive reinforcement provided - problem-solving facilitated - self-care encouraged - verbalization of feelings encouraged    Notes:     Patient verbalizes understanding of instructions provided today and agrees to view in [place].   Telephone follow up appointment with care management team member scheduled for:01/15/2021 at [place], [facility] Mobile: [phone]

## 2020-11-13 NOTE — Chronic Care Management (AMB) (Signed)
Chronic Care Management   CCM RN Visit Note  11/13/2020 Name: Tina Mcdonald MRN: [id number] DOB: May 23, 1941  Subjective: Tina Mcdonald is a 80 y.o. year old female who is a primary care patient of [name], [name], NP. The care management team was consulted for assistance with disease management and care coordination needs.    Engaged with patient by telephone for follow up visit in response to provider referral for case management and/or care coordination services.   Consent to Services:  The patient was given information about Chronic Care Management services, agreed to services, and gave verbal consent prior to initiation of services.  Please see initial visit note for detailed documentation.   Patient agreed to services and verbal consent obtained.   Assessment: Review of patient past medical history, allergies, medications, health status, including review of consultants reports, laboratory and other test data, was performed as part of comprehensive evaluation and provision of chronic care management services.   SDOH (Social Determinants of Health) assessments and interventions performed:    CCM Care Plan  Allergies  Allergen Reactions  . Contrast Media [Iodinated Diagnostic Agents] Itching  . Penicillins Itching    Has patient had a PCN reaction causing immediate rash, facial/tongue/throat swelling, SOB or lightheadedness with hypotension: Yes Has patient had a PCN reaction causing severe rash involving mucus membranes or skin necrosis: Unknown Has patient had a PCN reaction that required hospitalization: Yes Has patient had a PCN reaction occurring within the last 10 years: Yes If all of the above answers are "NO", then may proceed with Cephalosporin use.  . Sulfa Antibiotics Itching  . Latex Rash  . Neosporin [Neomycin-Bacitracin Zn-Polymyx] Itching and Rash  . Other Rash    Pt has reaction to high acidity foods.     Outpatient Encounter Medications as of 11/13/2020   Medication Sig  . acetaminophen (TYLENOL) 325 MG tablet Take 650 mg by mouth every 6 (six) hours as needed.  . blood glucose meter kit and supplies KIT Dispense based on patient and insurance preference. Use up to four times daily as directed. (FOR ICD-9 250.00, 250.01).  . cloNIDine (CATAPRES) 0.1 MG tablet Take 0.1 mg by mouth 2 (two) times daily.  . diclofenac sodium (VOLTAREN) 1 % GEL Apply 2 g topically 2 (two) times daily as needed.  . Ferrous Sulfate (SLOW FE PO) Take 1 tablet by mouth daily.  [name] gabapentin (NEURONTIN) 100 MG capsule Take 2 capsules (200 mg total) by mouth at bedtime.  [name] glucose blood test strip Check daily. ICD 10 E11.9  . irbesartan (AVAPRO) 300 MG tablet Take 1 tablet (300 mg total) by mouth daily. New blood pressure medicine.  . magnesium chloride (SLOW-MAG) 64 MG TBEC SR tablet Take 1 tablet (64 mg total) by mouth 2 (two) times daily.  . metFORMIN (GLUCOPHAGE-XR) 500 MG 24 hr tablet Take 1 tablet (500 mg total) by mouth 2 (two) times daily.  . metoprolol succinate (TOPROL-XL) 50 MG 24 hr tablet Take 1 tablet (50 mg total) by mouth daily. Take with or immediately following a meal.  . mirabegron ER (MYRBETRIQ) 50 MG TB24 tablet Take 50 mg by mouth daily.  [name] omeprazole (PRILOSEC) 20 MG capsule TAKE 1 CAPSULE BY MOUTH  DAILY  . rosuvastatin (CRESTOR) 40 MG tablet Take 1 tablet (40 mg total) by mouth daily. Stop Atorvastatin daily.  . vitamin C (ASCORBIC ACID) 250 MG tablet Take 1 tablet (250 mg total) by mouth daily.   Facility-Administered Encounter Medications as of  11/13/2020  Medication  . cyanocobalamin ((VITAMIN B-12)) injection 1,000 mcg    Patient Active Problem List   Diagnosis Date Noted  . AKI (acute kidney injury) ([place]) 10/23/2020  . Fatigue 10/18/2020  . Aortic atherosclerosis ([place]) 02/02/2020  . Nail fungus 04/12/2019  . Diverticula, colon   . Hiatal hernia   . Sleep apnea 02/24/2017  . Urge incontinence 11/24/2016  . Benign neoplasm of sigmoid  colon   . CKD stage 3 due to type 2 diabetes mellitus ([place]) 02/20/2015  . Polyp of sigmoid colon 01/16/2015  . Iron deficiency anemia 01/16/2015  . B12 deficiency 01/16/2015  . Radiculopathy 01/16/2015  . Type 2 diabetes mellitus with chronic kidney disease ([place]) 01/16/2015  . Osteoarthritis 01/16/2015  . Hypertension associated with diabetes ([place]) 01/16/2015  . Hyperlipidemia associated with type 2 diabetes mellitus ([place]) 01/16/2015  . Diabetic neuropathy ([place]) 01/16/2015  . GERD (gastroesophageal reflux disease) 01/16/2015    Conditions to be addressed/monitored:HTN, HLD, CKD Stage 3  and AKI  Care Plan : RNCM: HLD  Updates made by Inge Rise, RN since 11/13/2020 12:00 AM    Problem: RNCM: HLD   Priority: Medium    Long-Range Goal: RNCM: HLD   Priority: Medium  Note:   Current Barriers:  . Poorly controlled hyperlipidemia, complicated by HTN . Current antihyperlipidemic regimen: atorvastatin 80 mg . Most recent lipid panel:  . Lab Results .  Component . Value . Date .   [name] CHOL . 193 . 08/24/2020 .   [name] CHOL . 183 . 04/13/2020 .   [name] CHOL . 185 . 12/02/2019 .   [name] Lab Results .  Component . Value . Date .   [name] HDL . 50 . 08/24/2020 .   [name] HDL . 46 . 04/13/2020 .   [name] HDL . 51 . 12/02/2019 .   [name] Lab Results .  Component . Value . Date .   [name] [place] . 126 (H) . 08/24/2020 .   [name] [place] . 115 (H) . 04/13/2020 .   [name] [place] . 121 (H) . 12/02/2019 .   [name] Lab Results .  Component . Value . Date .   [name] TRIG . 94 . 08/24/2020 .   [name] TRIG . 125 . 04/13/2020 .   [name] TRIG . 67 . 12/02/2019 .   [name] No results found for: CHOLHDL . No results found for: LDLDIRECT  . ASCVD risk enhancing conditions: age >71, DM, HTN, CKD, former smoker . Unable to independently manage HLD . Unable to self administer medications as prescribed . Lacks social connections . Does not contact provider office for questions/concerns  RN Care Manager Clinical Goal(s):  [name] Over the next 120 days,  patient will work with Consulting civil engineer, providers, and care team towards execution of optimized self-health management plan . Over the next 120 days, patient will verbalize understanding of plan for effective management of HLD  . Over the next 120 days, patient will work with [facility] and pcp to address needs related to HLD  . Over the next 120 days, patient will attend all scheduled medical appointments: 11/26/2020 with PCP.  [place] appointment was done on 11/01/20  Interventions: . Collaboration with [name], NP regarding development and update of comprehensive plan of care as evidenced by provider attestation and co-signature . Inter-disciplinary care team collaboration (see longitudinal plan of care) . Medication review performed; medication list updated in electronic medical record.  Bertram Savin care team collaboration (see  longitudinal plan of care) . Referred to pharmacy team for assistance with HLD medication management . Evaluation of current treatment plan related to HLD and patient's adherence to plan as established by provider. On 11/13/20 Reviewed diet, eating habits, sleep patterns . Advised patient to call the office for changes in condition or questions . Provided education to patient re: heart healthy diet, medication compliance, and pacing activity  . Reviewed scheduled/upcoming provider appointments including: Has an appointment on 11/26/2020 with PCP to talk about right sided rib and breast pain that she is experiencing since hospitalization. . Discussed plans with patient for ongoing care management follow up and provided patient with direct contact information for care management team  Patient Goals/Self-Care Activities: . Over the next 120 days, patient will:   - call for medicine refill 2 or 3 days before it runs out - call if I am sick and can't take my medicine - keep a list of all the medicines I take; vitamins and herbals too - learn to read  medicine labels - use a pillbox to sort medicine - use an alarm clock or phone to remind me to take my medicine - change to whole grain breads, cereal, pasta - drink 6 to 8 glasses of water each day - eat 3 to 5 servings of fruits and vegetables each day - eat 5 or 6 small meals each day - fill half the plate with nonstarchy vegetables - limit fast food meals to no more than 1 per week - manage portion size - prepare main meal at home 3 to 5 days each week - read food labels for fat, fiber, carbohydrates and portion size - be open to making changes - I can manage, know and watch for signs of a heart attack - if I have chest pain, call for help - learn about small changes that will make a big difference - learn my personal risk factors - education plan reviewed and/or amended - empathy and reassurance conveyed - health literacy screen reviewed - patient's preferred learning methods utilized - privacy ensured - questions encouraged - readiness to learn monitored  Follow Up Plan: Telephone follow up appointment with care management team member scheduled for: 7/26/222 at 9:00am     Care Plan : RNCM: Hypertension (Adult)  Updates made by Inge Rise, RN since 11/13/2020 12:00 AM    Problem: RNCM: Hypertension (Hypertension)   Priority: Medium    Long-Range Goal: RNCM: Hypertension Monitored   Priority: Medium  Note:   Objective:  . Last practice recorded BP readings:  . BP Readings from Last 3 Encounters: .  11/01/20 . 134/71 .  10/27/20 . (!) 162/78 .  10/18/20 . (!) 100/50 .    [name] Most recent eGFR/CrCl: No results found for: EGFR  No components found for: CRCL Current Barriers:  [name] Knowledge Deficits related to basic understanding of hypertension pathophysiology and self care management . Knowledge Deficits related to understanding of medications prescribed for management of hypertension . Limited Social Support . Does not attend all scheduled provider  appointments . Lacks social connections . Does not contact provider office for questions/concerns Case Manager Clinical Goal(s):  [name] Over the next 120 days, patient will verbalize understanding of plan for hypertension management . Over the next 120 days, patient will attend all scheduled medical appointments: 11-26-2020 . Over the next 120 days, patient will demonstrate improved adherence to prescribed treatment plan for hypertension as evidenced by taking all medications as prescribed, monitoring and recording blood  pressure as directed, adhering to low sodium/DASH diet . Over the next 120 days, patient will demonstrate improved health management independence as evidenced by checking blood pressure as directed and notifying PCP if SBP>160 or DBP > 90, taking all medications as prescribe, and adhering to a low sodium diet as discussed. . Over the next 120 days, patient will verbalize basic understanding of hypertension disease process and self health management plan as evidenced by compliance with medications, heart healthy diet, and working with CCM team to manage health and well being Interventions:  . Collaboration with [name], NP regarding development and update of comprehensive plan of care as evidenced by provider attestation and co-signature . Inter-disciplinary care team collaboration (see longitudinal plan of care) . Evaluation of current treatment plan related to hypertension self management and patient's adherence to plan as established by provider. 11/13/20 Discussed diet, activity level, sleep patterns, medication changes.  States the medications is working well.  Asked about a prescription for Irbesartan to be sent to [facility].  Advised pt to talk to PCP at upcoming visit. [name] Provided education to patient re: stroke prevention, s/s of heart attack and stroke, DASH diet, complications of uncontrolled blood pressure . Reviewed medications with patient and discussed importance of  compliance . Discussed plans with patient for ongoing care management follow up and provided patient with direct contact information for care management team . Advised patient, providing education and rationale, to monitor blood pressure daily and record, calling PCP for findings outside established parameters.  . Reviewed scheduled/upcoming provider appointments including: 11-26-2020 at 1020am Patient Goals/Self-Care Activities . Over the next 120 days, patient will:  - Self administers medications as prescribed Attends all scheduled provider appointments Calls provider office for new concerns, questions, or BP outside discussed parameters Checks BP and records as discussed Follows a low sodium diet/DASH diet - depression screen reviewed - home or ambulatory blood pressure monitoring encouraged Follow Up Plan: Telephone follow up appointment with care management team member scheduled for: 01-15-2021 at [place] : RN CM Chronic Kidney (Adult) / AKI  Updates made by Inge Rise, RN since 11/13/2020 12:00 AM    Problem: RNCM Adjustment to Chronic Kidney Disease / AKI   Priority: High    Long-Range Goal: RNCM Manangement of CKD/AKI   Priority: High  Note:   Current Barriers:  [name] Knowledge Deficits related to recent hospitalization for AKI . Chronic Disease Management support and education needs related to CKD / AKI . Unable to independently manage dehydration leading to hospitalization for AKI.  Nurse Case Manager Clinical Goal(s):  . patient will verbalize understanding of plan for effective management of CKD /AKI . patient will work with RN CM and PCP to address needs related to new onset of AKI and other renal concerns . patient will demonstrate a decrease in dehydration  exacerbations as evidenced by lab work WNL and redutcion in ED visits and hospitalizations. . patient will attend all scheduled medical appointments: 11-26-2020  with PCP  Interventions:  . 1:1  collaboration with [name], NP regarding development and update of comprehensive plan of care as evidenced by provider attestation and co-signature . Inter-disciplinary care team collaboration (see longitudinal plan of care) . Evaluation of current treatment plan related to CKD/AKI and patient's adherence to plan as established by provider. . Advised patient to call the office if right sided pain worsens and for any other concerns. . Reviewed scheduled/upcoming provider appointments including: PCP visit on 11-26-2020  Patient Goals/Self-Care Activities  - - counseling provided - decision-making supported - positive reinforcement provided - problem-solving facilitated - self-care encouraged - verbalization of feelings encouraged   Follow Up Plan: Telephone follow up appointment with care management team member scheduled for:01-15-2021 at 900am The patient has been provided with contact information for the care management team and has been advised to call with any health related questions or concerns.        Task: RNCM Management for CKD / AKI   Note:   Care Management Activities:    - counseling provided - decision-making supported - positive reinforcement provided - problem-solving facilitated - self-care encouraged - verbalization of feelings encouraged    Notes:      Plan:Telephone follow up appointment with care management team member scheduled for:  01-15-2001 at [place] RN, [facility] Mobile: [phone]

## 2020-11-13 NOTE — Progress Notes (Deleted)
Error- see new encounter- in training  No note needed.

## 2020-11-15 ENCOUNTER — Other Ambulatory Visit: Payer: Self-pay | Admitting: Nurse Practitioner

## 2020-11-16 ENCOUNTER — Encounter: Payer: Self-pay | Admitting: Urology

## 2020-11-20 ENCOUNTER — Other Ambulatory Visit: Payer: Self-pay | Admitting: Nurse Practitioner

## 2020-11-22 DIAGNOSIS — I10 Essential (primary) hypertension: Secondary | ICD-10-CM | POA: Diagnosis not present

## 2020-11-22 DIAGNOSIS — N179 Acute kidney failure, unspecified: Secondary | ICD-10-CM | POA: Diagnosis not present

## 2020-11-23 ENCOUNTER — Encounter: Payer: Self-pay | Admitting: Nurse Practitioner

## 2020-11-26 ENCOUNTER — Ambulatory Visit: Payer: Medicare Other | Admitting: Nurse Practitioner

## 2020-11-29 ENCOUNTER — Encounter: Payer: Self-pay | Admitting: Nurse Practitioner

## 2020-11-29 ENCOUNTER — Other Ambulatory Visit: Payer: Self-pay

## 2020-11-29 ENCOUNTER — Ambulatory Visit (INDEPENDENT_AMBULATORY_CARE_PROVIDER_SITE_OTHER): Payer: Medicare Other | Admitting: Nurse Practitioner

## 2020-11-29 VITALS — BP 117/74 | HR 72 | Temp 98.8°F | Ht 62.0 in | Wt 152.0 lb

## 2020-11-29 DIAGNOSIS — E1159 Type 2 diabetes mellitus with other circulatory complications: Secondary | ICD-10-CM | POA: Diagnosis not present

## 2020-11-29 DIAGNOSIS — E1149 Type 2 diabetes mellitus with other diabetic neurological complication: Secondary | ICD-10-CM

## 2020-11-29 DIAGNOSIS — E538 Deficiency of other specified B group vitamins: Secondary | ICD-10-CM | POA: Diagnosis not present

## 2020-11-29 DIAGNOSIS — N183 Chronic kidney disease, stage 3 unspecified: Secondary | ICD-10-CM | POA: Diagnosis not present

## 2020-11-29 DIAGNOSIS — E1169 Type 2 diabetes mellitus with other specified complication: Secondary | ICD-10-CM

## 2020-11-29 DIAGNOSIS — E785 Hyperlipidemia, unspecified: Secondary | ICD-10-CM | POA: Diagnosis not present

## 2020-11-29 DIAGNOSIS — D631 Anemia in chronic kidney disease: Secondary | ICD-10-CM

## 2020-11-29 DIAGNOSIS — I700 Atherosclerosis of aorta: Secondary | ICD-10-CM | POA: Diagnosis not present

## 2020-11-29 DIAGNOSIS — N1832 Chronic kidney disease, stage 3b: Secondary | ICD-10-CM

## 2020-11-29 DIAGNOSIS — I152 Hypertension secondary to endocrine disorders: Secondary | ICD-10-CM

## 2020-11-29 DIAGNOSIS — E1122 Type 2 diabetes mellitus with diabetic chronic kidney disease: Secondary | ICD-10-CM | POA: Diagnosis not present

## 2020-11-29 LAB — BAYER DCA HB A1C WAIVED: HB A1C (BAYER DCA - WAIVED): 6.8 % (ref <7.0)

## 2020-11-29 MED ORDER — ROSUVASTATIN CALCIUM 40 MG PO TABS: 40.0000 mg | ORAL_TABLET | Freq: Every day | ORAL | 4 refills | Status: DC

## 2020-11-29 MED ORDER — NYSTATIN 100000 UNIT/GM EX POWD: 1.0000 "application " | Freq: Three times a day (TID) | CUTANEOUS | 4 refills | Status: DC

## 2020-11-29 MED ORDER — CYANOCOBALAMIN 1000 MCG/ML IJ SOLN
1000.0000 ug | Freq: Once | INTRAMUSCULAR | Status: AC
  Administered 2020-11-29: 1000 ug via INTRAMUSCULAR

## 2020-11-29 MED ORDER — GABAPENTIN 100 MG PO CAPS: 200.0000 mg | ORAL_CAPSULE | Freq: Every day | ORAL | 4 refills | Status: DC

## 2020-11-29 MED ORDER — METFORMIN HCL ER 500 MG PO TB24: 500.0000 mg | ORAL_TABLET | Freq: Two times a day (BID) | ORAL | 4 refills | Status: DC

## 2020-11-29 MED ORDER — OMEPRAZOLE 20 MG PO CPDR: 1.0000 | DELAYED_RELEASE_CAPSULE | Freq: Every day | ORAL | 4 refills | Status: DC

## 2020-11-29 NOTE — Assessment & Plan Note (Signed)
Chronic, ongoing with today A1C 6.8% and last GFR 32 -- urine ALB 19 September 2020.  Continue current medication regimen and renal dosed Metformin -- discontinue if GFR <30.  Continue Olmesartan for kidney protection + continue collaboration with nephrology.  Return in 3 months.

## 2020-11-29 NOTE — Assessment & Plan Note (Signed)
Chronic, ongoing.  Continue current medication regimen and adjust as needed.  Lipid panel next visit.  Recheck CK today.

## 2020-11-29 NOTE — Assessment & Plan Note (Signed)
Chronic, ongoing with recent A1C 6.8%.  Continue Metformin 500 MG twice a day and Gabapentin for neuropathy + continuing to monitor blood sugar daily.  Plan to obtain CMP next visit to ensure no further renal dosing needed and will adjust medication dose as needed. Return in 3 months.

## 2020-11-29 NOTE — Progress Notes (Signed)
BP 117/74   Pulse 72   Temp 98.8 F (37.1 C) (Oral)   Ht 5\' 2"  (1.575 m)   Wt 152 lb (68.9 kg)   LMP  (LMP Unknown)   SpO2 100%   BMI 27.80 kg/m    Subjective:    Patient ID: Tina Mcdonald, female    DOB: 1941-03-28, 80 y.o.   MRN: [id number]  HPI: Tina Mcdonald is a 80 y.o. female  Chief Complaint  Patient presents with   Diabetes   Gastroesophageal Reflux   Anemia   Hypertension   Hyperlipidemia   b-12 f/u   DIABETES Continues on Metformin 500 MG BID (renal dosed) and Gabapentin.  Last A1C 7.8% May.  Continues on B12 and Slow Fe for anemia, last saw Dr. 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  Does have a hiatal hernia.  Has underlying OSA -- but could not afford cost of return to sleep study location or equipment.    Hypoglycemic episodes:no Polydipsia/polyuria: no Visual disturbance: no Chest pain: no Paresthesias: no Glucose Monitoring: yes             Accucheck frequency: every other day             Fasting glucose: 110 range             Post prandial:             Evening:             Before meals: Taking Insulin?: no             Long acting insulin:             Short acting insulin: Blood Pressure Monitoring: not checking Retinal Examination: Not Up To Date -- Dr. Gloriann Loan, [facility] -- had recent retina exam in [place] Exam: Up to Date Pneumovax: Up to Date Influenza: Up to Date Aspirin: no   CHRONIC KIDNEY DISEASE New diagnosis of kidney disease 10/23/20 -- last saw nephrology on 11/22/20 == CRT 1.72, GFR 32, PTH 63, H/H 10.2/32.6. CKD status: stable Medications renally dose: yes Previous renal evaluation: yes Pneumovax:  Up to Date Influenza Vaccine:  Up to Date    HYPERTENSION / HYPERLIPIDEMIA Continues on Irbesartan and Clonidine + Lipitor.  Last saw Dr. Clayborn Bigness 03/22/2019.  Has history of aortic atherosclerosis noted on past CT 01/26/17. Satisfied with current treatment? yes Duration of  hypertension: chronic BP monitoring frequency: once a week BP range: 120/70 range BP medication side effects: no Duration of hyperlipidemia: chronic Cholesterol medication side effects: no Cholesterol supplements: none Medication compliance: good compliance Aspirin: no Recent stressors: no Recurrent headaches: no Visual changes: no Palpitations: no Dyspnea: no Chest pain: no Lower extremity edema: no Dizzy/lightheaded: no   Relevant past medical, surgical, family and social history reviewed and updated as indicated. Interim medical history since our last visit reviewed. Allergies and medications reviewed and updated.  Review of Systems  Constitutional:  Negative for activity change, appetite change, diaphoresis, fatigue and fever.  Respiratory:  Negative for cough, chest tightness, shortness of breath and wheezing.   Cardiovascular:  Negative for chest pain, palpitations and leg swelling.  Gastrointestinal: Negative.   Endocrine: Negative for cold intolerance, heat intolerance, polydipsia, polyphagia and polyuria.  Neurological: Negative.   Psychiatric/Behavioral: Negative.     Per HPI unless specifically indicated above     Objective:    BP 117/74   Pulse 72   Temp 98.8 F (  37.1 C) (Oral)   Ht 5\' 2"  (1.575 m)   Wt 152 lb (68.9 kg)   LMP  (LMP Unknown)   SpO2 100%   BMI 27.80 kg/m   Wt Readings from Last 3 Encounters:  11/29/20 152 lb (68.9 kg)  11/01/20 161 lb 3.2 oz (73.1 kg)  10/23/20 155 lb (70.3 kg)    Physical Exam Vitals and nursing note reviewed.  Constitutional:      General: She is awake. She is not in acute distress.    Appearance: She is well-developed, well-groomed and overweight. She is not ill-appearing.  HENT:     Head: Normocephalic.     Right Ear: Hearing normal.     Left Ear: Hearing normal.  Eyes:     General: Lids are normal.        Right eye: No discharge.        Left eye: No discharge.     Conjunctiva/sclera: Conjunctivae normal.      Pupils: Pupils are equal, round, and reactive to light.  Neck:     Thyroid: No thyromegaly.     Vascular: No carotid bruit.  Cardiovascular:     Rate and Rhythm: Normal rate and regular rhythm.     Heart sounds: Normal heart sounds. No murmur heard.   No gallop.  Pulmonary:     Effort: Pulmonary effort is normal. No accessory muscle usage or respiratory distress.     Breath sounds: Normal breath sounds.  Abdominal:     General: Bowel sounds are normal.     Palpations: Abdomen is soft. There is no hepatomegaly or splenomegaly.  Musculoskeletal:     Cervical back: Normal range of motion and neck supple.     Right lower leg: No edema.     Left lower leg: No edema.  Lymphadenopathy:     Cervical: No cervical adenopathy.  Skin:    General: Skin is warm and dry.  Neurological:     Mental Status: She is alert and oriented to person, place, and time.  Psychiatric:        Attention and Perception: Attention normal.        Mood and Affect: Mood normal.        Speech: Speech normal.        Behavior: Behavior normal. Behavior is cooperative.        Thought Content: Thought content normal.   Results for orders placed or performed in visit on 11/29/20  Bayer DCA Hb A1c Waived  Result Value Ref Range   HB A1C (BAYER DCA - WAIVED) 6.8 <7.0 %      Assessment & Plan:   Problem List Items Addressed This Visit       Cardiovascular and Mediastinum   Hypertension associated with diabetes ([name])    Chronic, ongoing with BP at goal today on recheck. Recommend she monitor BP at least a few mornings a week at home and document.  DASH diet at home.  Continue current medication regimen and adjust as needed.  Labs next visit: CMP, TSH, lipid.  Return in 3 months.        Relevant Medications   rosuvastatin (CRESTOR) 40 MG tablet   metFORMIN (GLUCOPHAGE-XR) 500 MG 24 hr tablet   Aortic atherosclerosis (HCC)    Chronic, ongoing noted on imaging CT 01/26/17.  Continue daily statin and ASA for  prevention and monitor closely.       Relevant Medications   rosuvastatin (CRESTOR) 40 MG tablet  Endocrine   Hyperlipidemia associated with type 2 diabetes mellitus (HCC)    Chronic, ongoing.  Continue current medication regimen and adjust as needed.  Lipid panel next visit.  Recheck CK today.       Relevant Medications   rosuvastatin (CRESTOR) 40 MG tablet   metFORMIN (GLUCOPHAGE-XR) 500 MG 24 hr tablet   Diabetic neuropathy (HCC)    Chronic, ongoing with recent A1C 6.8%.  Continue Metformin 500 MG twice a day and Gabapentin for neuropathy + continuing to monitor blood sugar daily.  Plan to obtain CMP next visit to ensure no further renal dosing needed and will adjust medication dose as needed. Return in 3 months.       Relevant Medications   rosuvastatin (CRESTOR) 40 MG tablet   metFORMIN (GLUCOPHAGE-XR) 500 MG 24 hr tablet   CKD stage 3 due to type 2 diabetes mellitus ([place]) - Primary    Chronic, ongoing with today A1C 6.8% and last GFR 32 -- urine ALB 19 September 2020.  Continue current medication regimen and renal dosed Metformin -- discontinue if GFR <30.  Continue Olmesartan for kidney protection + continue collaboration with nephrology.  Return in 3 months.       Relevant Medications   rosuvastatin (CRESTOR) 40 MG tablet   metFORMIN (GLUCOPHAGE-XR) 500 MG 24 hr tablet   Other Relevant Orders   Bayer DCA Hb A1c Waived (Completed)   CK (Creatine Kinase)     Other   Anemia in chronic kidney disease (CKD)    Chronic, ongoing.  Continue current medication regimen and collaboration with hematology.        B12 deficiency    Continue monthly injections and obtain B12 level next visit.         Follow up plan: Return in about 3 months (around 03/01/2021) for T2DM, HTN/HLD, CKD, CHECK MAG And VIT D.

## 2020-11-29 NOTE — Assessment & Plan Note (Signed)
Chronic, ongoing noted on imaging CT 01/26/17.  Continue daily statin and ASA for prevention and monitor closely.

## 2020-11-29 NOTE — Assessment & Plan Note (Signed)
Chronic, ongoing with BP at goal today on recheck. Recommend she monitor BP at least a few mornings a week at home and document.  DASH diet at home.  Continue current medication regimen and adjust as needed.  Labs next visit: CMP, TSH, lipid.  Return in 3 months.

## 2020-11-29 NOTE — Assessment & Plan Note (Signed)
Continue monthly injections and obtain B12 level next visit.

## 2020-11-29 NOTE — Assessment & Plan Note (Signed)
Chronic, ongoing.  Continue current medication regimen and collaboration with hematology.

## 2020-11-29 NOTE — Patient Instructions (Signed)
Diabetes Mellitus and Nutrition, Adult When you have diabetes, or diabetes mellitus, it is very important to have healthy eating habits because your blood sugar (glucose) levels are greatly affected by what you eat and drink. Eating healthy foods in the right amounts, at about the same times every day, can help you:  Control your blood glucose.  Lower your risk of heart disease.  Improve your blood pressure.  Reach or maintain a healthy weight. What can affect my meal plan? Every person with diabetes is different, and each person has different needs for a meal plan. Your health care provider may recommend that you work with a dietitian to make a meal plan that is best for you. Your meal plan may vary depending on factors such as:  The calories you need.  The medicines you take.  Your weight.  Your blood glucose, blood pressure, and cholesterol levels.  Your activity level.  Other health conditions you have, such as heart or kidney disease. How do carbohydrates affect me? Carbohydrates, also called carbs, affect your blood glucose level more than any other type of food. Eating carbs naturally raises the amount of glucose in your blood. Carb counting is a method for keeping track of how many carbs you eat. Counting carbs is important to keep your blood glucose at a healthy level, especially if you use insulin or take certain oral diabetes medicines. It is important to know how many carbs you can safely have in each meal. This is different for every person. Your dietitian can help you calculate how many carbs you should have at each meal and for each snack. How does alcohol affect me? Alcohol can cause a sudden decrease in blood glucose (hypoglycemia), especially if you use insulin or take certain oral diabetes medicines. Hypoglycemia can be a life-threatening condition. Symptoms of hypoglycemia, such as sleepiness, dizziness, and confusion, are similar to symptoms of having too much  alcohol.  Do not drink alcohol if: ? Your health care provider tells you not to drink. ? You are pregnant, may be pregnant, or are planning to become pregnant.  If you drink alcohol: ? Do not drink on an empty stomach. ? Limit how much you use to:  0-1 drink a day for women.  0-2 drinks a day for men. ? Be aware of how much alcohol is in your drink. In the [place] one drink equals one 12 oz bottle of beer (355 mL), one 5 oz glass of wine (148 mL), or one 1 oz glass of hard liquor (44 mL). ? Keep yourself hydrated with water, diet soda, or unsweetened iced tea.  Keep in mind that regular soda, juice, and other mixers may contain a lot of sugar and must be counted as carbs. What are tips for following this plan? Reading food labels  Start by checking the serving size on the "Nutrition Facts" label of packaged foods and drinks. The amount of calories, carbs, fats, and other nutrients listed on the label is based on one serving of the item. Many items contain more than one serving per package.  Check the total grams (g) of carbs in one serving. You can calculate the number of servings of carbs in one serving by dividing the total carbs by 15. For example, if a food has 30 g of total carbs per serving, it would be equal to 2 servings of carbs.  Check the number of grams (g) of saturated fats and trans fats in one serving. Choose foods that have   a low amount or none of these fats.  Check the number of milligrams (mg) of salt (sodium) in one serving. Most people should limit total sodium intake to less than 2,300 mg per day.  Always check the nutrition information of foods labeled as "low-fat" or "nonfat." These foods may be higher in added sugar or refined carbs and should be avoided.  Talk to your dietitian to identify your daily goals for nutrients listed on the label. Shopping  Avoid buying canned, pre-made, or processed foods. These foods tend to be high in fat, sodium, and added  sugar.  Shop around the outside edge of the grocery store. This is where you will most often find fresh fruits and vegetables, bulk grains, fresh meats, and fresh dairy. Cooking  Use low-heat cooking methods, such as baking, instead of high-heat cooking methods like deep frying.  Cook using healthy oils, such as olive, canola, or sunflower oil.  Avoid cooking with butter, cream, or high-fat meats. Meal planning  Eat meals and snacks regularly, preferably at the same times every day. Avoid going long periods of time without eating.  Eat foods that are high in fiber, such as fresh fruits, vegetables, beans, and whole grains. Talk with your dietitian about how many servings of carbs you can eat at each meal.  Eat 4-6 oz (112-168 g) of lean protein each day, such as lean meat, chicken, fish, eggs, or tofu. One ounce (oz) of lean protein is equal to: ? 1 oz (28 g) of meat, chicken, or fish. ? 1 egg. ?  cup (62 g) of tofu.  Eat some foods each day that contain healthy fats, such as avocado, nuts, seeds, and fish.   What foods should I eat? Fruits Berries. Apples. Oranges. Peaches. Apricots. Plums. Grapes. Mango. Papaya. Pomegranate. Kiwi. Cherries. Vegetables Lettuce. Spinach. Leafy greens, including kale, chard, collard greens, and mustard greens. Beets. Cauliflower. Cabbage. Broccoli. Carrots. Green beans. Tomatoes. Peppers. Onions. Cucumbers. Brussels sprouts. Grains Whole grains, such as whole-wheat or whole-grain bread, crackers, tortillas, cereal, and pasta. Unsweetened oatmeal. Quinoa. Brown or wild rice. Meats and other proteins Seafood. Poultry without skin. Lean cuts of poultry and beef. Tofu. Nuts. Seeds. Dairy Low-fat or fat-free dairy products such as milk, yogurt, and cheese. The items listed above may not be a complete list of foods and beverages you can eat. Contact a dietitian for more information. What foods should I avoid? Fruits Fruits canned with  syrup. Vegetables Canned vegetables. Frozen vegetables with butter or cream sauce. Grains Refined white flour and flour products such as bread, pasta, snack foods, and cereals. Avoid all processed foods. Meats and other proteins Fatty cuts of meat. Poultry with skin. Breaded or fried meats. Processed meat. Avoid saturated fats. Dairy Full-fat yogurt, cheese, or milk. Beverages Sweetened drinks, such as soda or iced tea. The items listed above may not be a complete list of foods and beverages you should avoid. Contact a dietitian for more information. Questions to ask a health care provider  Do I need to meet with a diabetes educator?  Do I need to meet with a dietitian?  What number can I call if I have questions?  When are the best times to check my blood glucose? Where to find more information:  American Diabetes Association: diabetes.org  Academy of Nutrition and Dietetics: www.eatright.org  National Institute of Diabetes and Digestive and Kidney Diseases: www.niddk.nih.gov  Association of Diabetes Care and Education Specialists: www.diabeteseducator.org Summary  It is important to have healthy eating   habits because your blood sugar (glucose) levels are greatly affected by what you eat and drink.  A healthy meal plan will help you control your blood glucose and maintain a healthy lifestyle.  Your health care provider may recommend that you work with a dietitian to make a meal plan that is best for you.  Keep in mind that carbohydrates (carbs) and alcohol have immediate effects on your blood glucose levels. It is important to count carbs and to use alcohol carefully. This information is not intended to replace advice given to you by your health care provider. Make sure you discuss any questions you have with your health care provider. Document Revised: 05/17/2019 Document Reviewed: 05/17/2019 [employer] Patient Education  2021 [employer]  

## 2020-11-30 LAB — CK: Total CK: 85 U/L (ref 32–182)

## 2020-11-30 NOTE — Progress Notes (Signed)
Contacted via MyChart   Good evening Reema -- your CK level is now normal!!  [employer]!! Keep being awesome!!  Thank you for allowing me to participate in your care.  I appreciate you. Kindest regards, Benigna Delisi

## 2020-12-11 ENCOUNTER — Telehealth: Payer: Self-pay

## 2020-12-11 NOTE — Telephone Encounter (Signed)
Called and left a message for patient to return my call to discuss the medication.

## 2020-12-11 NOTE — Telephone Encounter (Signed)
Copied from [place] [phone]. Topic: General - Other >> Dec 11, 2020 10:27 AM Yvette Rack wrote: Reason for CRM: Pt requests that Jolene return her call to discuss a medication by the name of slow mag.

## 2020-12-11 NOTE — Telephone Encounter (Signed)
Spoke with patient, she states that's he had to buy otc slow mag and it does not have a dosage on it, is this ok, she wanted to make sure that she is not taking too much

## 2020-12-12 NOTE — Telephone Encounter (Signed)
Patient notified

## 2020-12-20 ENCOUNTER — Telehealth: Payer: Self-pay

## 2020-12-20 ENCOUNTER — Other Ambulatory Visit: Payer: Self-pay | Admitting: Nurse Practitioner

## 2020-12-20 NOTE — Telephone Encounter (Signed)
noted 

## 2020-12-20 NOTE — Telephone Encounter (Signed)
Called pt scheduled virtual for tomorrow with lauren  Copied from [place] [id number]. Topic: General - Other >> Dec 20, 2020  4:01 PM Tessa Lerner A wrote: Reason for CRM: Patient has tested positive for COVID 19 via an at home test on 12/20/20  The patient would like to discuss this further with a member of clinical staff  The patient is uncertain if they need another test or a prescription for their symptoms (congestion and sore throat)  Please contact to further advise

## 2020-12-21 ENCOUNTER — Other Ambulatory Visit: Payer: Self-pay

## 2020-12-21 ENCOUNTER — Encounter: Payer: Self-pay | Admitting: Nurse Practitioner

## 2020-12-21 ENCOUNTER — Telehealth: Payer: Self-pay | Admitting: Nurse Practitioner

## 2020-12-21 ENCOUNTER — Ambulatory Visit (INDEPENDENT_AMBULATORY_CARE_PROVIDER_SITE_OTHER): Payer: Medicare Other | Admitting: Nurse Practitioner

## 2020-12-21 DIAGNOSIS — U071 COVID-19: Secondary | ICD-10-CM

## 2020-12-21 MED ORDER — MOLNUPIRAVIR EUA 200MG CAPSULE
4.0000 | ORAL_CAPSULE | Freq: Two times a day (BID) | ORAL | 0 refills | Status: DC
  Filled 2020-12-21: qty 40, 5d supply, fill #0

## 2020-12-21 MED ORDER — BENZONATATE 100 MG PO CAPS
100.0000 mg | ORAL_CAPSULE | Freq: Three times a day (TID) | ORAL | 0 refills | Status: DC | PRN
Start: 2020-12-21 — End: 2021-03-01

## 2020-12-21 NOTE — Assessment & Plan Note (Addendum)
Positive home test. Symptoms started on Tuesday. Will treat with molnupiravir and tessalon prn for cough. Encouraged her to rest and drink plenty of fluids. She can take tylenol as needed for her headache. She needs to isolate for 10 days. Call us or go to ER if symptoms worsen or develop shortness of breath or chest pain. Follow-up in office in 1 week after isolation period.   Addendum 1300: Received a message from Santiago Glad at the pharmacy saying that the patient says her covid-19 test was negative, but her niece who helped administer the test said there was a light pink line. Will have her come in for a covid-19 test in office.

## 2020-12-21 NOTE — Telephone Encounter (Signed)
Patient has been called back, she will come in to the office at 4pm for a covid swab. Patient states that she has promised to make coleslaw and potato salad today and I advised against that and to remain in quarantine until we get her test results back.

## 2020-12-21 NOTE — Addendum Note (Signed)
Addended by: Vance Peper A on: 12/21/2020 01:13 PM   Modules accepted: Orders

## 2020-12-21 NOTE — Telephone Encounter (Signed)
Received a call from Santiago Glad at the pharmacy saying that [name] said she had a negative home [id number] test. With conflicting information, will have nurse call the patient to verify if home test was positive or negative. If she was negative, we will have her come into the office for a pcr test. Her symptoms, especially loss of sense of smell and taste, are notable symptoms of covid-19. Will cancel molnupriavir for now and if she tests positive tomorrow, will re-order as she will still be within 5 days window.

## 2020-12-21 NOTE — Progress Notes (Addendum)
Acute Office Visit  Subjective:    Patient ID: Tina Mcdonald, female    DOB: 11/24/1940, 80 y.o.   MRN: [id number]  Chief Complaint  Patient presents with   Covid Exposure    Hedache, cough that is productive. Aslo stated that she is cold every now and then    HPI Patient is in today productive cough and headache since Tuesday. She took a covid-19 test at home and it was positive. She also lost her sense of taste and smell starting Wednesday.   UPPER RESPIRATORY TRACT INFECTION  Worst symptom: headache Fever: no Cough: yes Shortness of breath: no Wheezing: no Chest pain: no Chest tightness: no Chest congestion: no Nasal congestion: yes Runny nose: yes Post nasal drip: no Sneezing: no Sore throat: no Swollen glands: no Sinus pressure: no Headache: yes Face pain: no Toothache: no Ear pain: no  Ear pressure: yes bilateral Eyes red/itching:no Eye drainage/crusting: no  Vomiting: no Rash: no Fatigue: yes Sick contacts: no Strep contacts: no  Context: worse Recurrent sinusitis: no Relief with OTC cold/cough medications:  n/a   Treatments attempted: none    Past Medical History:  Diagnosis Date   Anemia    Arthritis    Chronic kidney disease    Colon polyps    Diabetes mellitus without complication (HCC)    Diabetic neuropathy (HCC)    Dyspnea    GERD (gastroesophageal reflux disease)    Hyperlipemia    Hypertension    Osteoarthritis    Radiculopathy    weakness in right leg   Sleep apnea    does not have her cpap anymore   Stress incontinence    Urticaria    Vitamin B 12 deficiency    Wears glasses     Past Surgical History:  Procedure Laterality Date   ABDOMINAL HYSTERECTOMY     APPENDECTOMY     BREAST BIOPSY Right    right-neg   BREAST BIOPSY Left    us/bx/clip-neg   CARPAL TUNNEL RELEASE     right   COLONOSCOPY     COLONOSCOPY WITH PROPOFOL N/A 12/10/2015   Procedure: COLONOSCOPY WITH PROPOFOL;  Surgeon: Lucilla Lame, MD;  Location:  [facility];  Service: Endoscopy;  Laterality: N/A;  Diabetic - oral meds Sleep Apnea LATEX allergy   COLONOSCOPY WITH PROPOFOL N/A 01/17/2019   Procedure: COLONOSCOPY WITH PROPOFOL;  Surgeon: Virgel Manifold, MD;  Location: [facility];  Service: Endoscopy;  Laterality: N/A;   CYSTOSCOPY     DILATION AND CURETTAGE OF UTERUS     ESOPHAGOGASTRODUODENOSCOPY (EGD) WITH PROPOFOL N/A 01/17/2019   Procedure: ESOPHAGOGASTRODUODENOSCOPY (EGD) WITH PROPOFOL;  Surgeon: Virgel Manifold, MD;  Location: [facility];  Service: Endoscopy;  Laterality: N/A;   EYE SURGERY Bilateral    lens implant   GIVENS CAPSULE STUDY N/A 05/04/2019   Procedure: GIVENS CAPSULE STUDY;  Surgeon: Virgel Manifold, MD;  Location: [facility];  Service: Endoscopy;  Laterality: N/A;   LUMBAR FUSION  2009   POLYPECTOMY  12/10/2015   Procedure: POLYPECTOMY;  Surgeon: Lucilla Lame, MD;  Location: [place];  Service: Endoscopy;;   POLYPECTOMY  01/17/2019   Procedure: POLYPECTOMY;  Surgeon: Virgel Manifold, MD;  Location: [facility];  Service: Endoscopy;;   RESECTION DISTAL CLAVICAL Right 11/25/2013   Procedure: RIGHT SHOULDER OPEN RESECTION DISTAL CLAVICAL EXCISION SOFT TISSUE TUMOR SHOULDER DEEP SUBFASCIAL INTRAMUSCULAR/DEBRIDEMENT/ROTATOR CUFF REPAIR/BICEPS TENODESIS/MANIPULATION;  Surgeon: Renette Butters, MD;  Location: [facility];  Service: Orthopedics;  Laterality: Right;   SHOULDER ARTHROSCOPY WITH ROTATOR CUFF REPAIR AND SUBACROMIAL DECOMPRESSION Right 11/25/2013   Procedure: SHOULDER ARTHROSCOPY WITH DEBRIDEMENT ROTATOR CUFF REPAIR  A;  Surgeon: Renette Butters, MD;  Location: [place] [facility];  Service: Orthopedics;  Laterality: Right;    Family History  Problem Relation Age of Onset   Heart disease Mother    Diabetes Mother    Hypertension Mother    Heart disease Father    Hypertension Father    Diabetes Sister    Hypertension Sister     Heart disease Sister    Kidney disease Sister    Diabetes Sister    Hypertension Sister    Breast cancer Cousin 85   Non-Hodgkin's lymphoma Maternal Uncle     Social History   Socioeconomic History   Marital status: Single    Spouse name: Not on file   Number of children: Not on file   Years of education: Not on file   Highest education level: Some college, no degree  Occupational History   Occupation: retired  Tobacco Use   Smoking status: Former    Packs/day: 1.00    Years: 15.00    Pack years: 15.00    Types: Cigarettes    Quit date: 11/22/1997    Years since quitting: 23.0   Smokeless tobacco: Never  Vaping Use   Vaping Use: Never used  Substance and Sexual Activity   Alcohol use: No   Drug use: No   Sexual activity: Not Currently  Other Topics Concern   Not on file  Social History Narrative   Not on file   Social Determinants of Health   Financial Resource Strain: Low Risk    Difficulty of Paying Living Expenses: Not hard at all  Food Insecurity: No Food Insecurity   Worried About Charity fundraiser in the Last Year: Never true   [place] in the Last Year: Never true  Transportation Needs: No Transportation Needs   Lack of Transportation (Medical): No   Lack of Transportation (Non-Medical): No  Physical Activity: Inactive   Days of Exercise per Week: 0 days   Minutes of Exercise per Session: 0 min  Stress: No Stress Concern Present   Feeling of Stress : Not at all  Social Connections: Unknown   Frequency of Communication with Friends and Family: More than three times a week   Frequency of Social Gatherings with Friends and Family: More than three times a week   Attends Religious Services: More than 4 times per year   Active Member of [employer] or Organizations: Yes   Attends Music therapist: More than 4 times per year   Marital Status: Not on file  Intimate Partner Violence: Not At Risk   Fear of Current or Ex-Partner: No    Emotionally Abused: No   Physically Abused: No   Sexually Abused: No    Outpatient Medications Prior to Visit  Medication Sig Dispense Refill   acetaminophen (TYLENOL) 325 MG tablet Take 650 mg by mouth every 6 (six) hours as needed.     blood glucose meter kit and supplies KIT Dispense based on patient and insurance preference. Use up to four times daily as directed. (FOR ICD-9 250.00, 250.01). 1 each 0   cloNIDine (CATAPRES) 0.1 MG tablet Take 0.1 mg by mouth 2 (two) times daily.     diclofenac sodium (VOLTAREN) 1 % GEL Apply 2 g topically 2 (two) times daily as  needed. 1 Tube 2   Ferrous Sulfate (SLOW FE PO) Take 1 tablet by mouth daily.     gabapentin (NEURONTIN) 100 MG capsule Take 2 capsules (200 mg total) by mouth at bedtime. 90 capsule 4   glucose blood test strip Check daily. ICD 10 E11.9 100 each 12   irbesartan (AVAPRO) 300 MG tablet Take 1 tablet (300 mg total) by mouth daily. New blood pressure medicine. 90 tablet 4   magnesium chloride (SLOW-MAG) 64 MG TBEC SR tablet Take 1 tablet (64 mg total) by mouth 2 (two) times daily. 60 tablet 4   metFORMIN (GLUCOPHAGE-XR) 500 MG 24 hr tablet Take 1 tablet (500 mg total) by mouth 2 (two) times daily. 180 tablet 4   metoprolol succinate (TOPROL-XL) 50 MG 24 hr tablet TAKE 1 TABLET BY MOUTH  DAILY WITH OR IMMEDIATELY  FOLLOWING A MEAL 90 tablet 3   mirabegron ER (MYRBETRIQ) 50 MG TB24 tablet Take 50 mg by mouth daily.     nystatin (MYCOSTATIN/NYSTOP) powder Apply 1 application topically 3 (three) times daily. 15 g 4   omeprazole (PRILOSEC) 20 MG capsule Take 1 capsule (20 mg total) by mouth daily. 90 capsule 4   rosuvastatin (CRESTOR) 40 MG tablet Take 1 tablet (40 mg total) by mouth daily. 90 tablet 4   vitamin C (ASCORBIC ACID) 250 MG tablet Take 1 tablet (250 mg total) by mouth daily. (Patient taking differently: Take 250,500 mg by mouth daily.)     Facility-Administered Medications Prior to Visit  Medication Dose Route Frequency  Provider Last Rate Last Admin   cyanocobalamin ((VITAMIN B-12)) injection 1,000 mcg  1,000 mcg Intramuscular Q30 days Kathrine Haddock, NP   1,000 mcg at 08/17/20 1022    Allergies  Allergen Reactions   Contrast Media [Iodinated Diagnostic Agents] Itching   Penicillins Itching    Has patient had a PCN reaction causing immediate rash, facial/tongue/throat swelling, SOB or lightheadedness with hypotension: Yes Has patient had a PCN reaction causing severe rash involving mucus membranes or skin necrosis: Unknown Has patient had a PCN reaction that required hospitalization: Yes Has patient had a PCN reaction occurring within the last 10 years: Yes If all of the above answers are "NO", then may proceed with Cephalosporin use.   Sulfa Antibiotics Itching   Aloe Itching and Rash   Latex Rash   Neosporin [Neomycin-Bacitracin Zn-Polymyx] Itching and Rash   Other Rash    Pt has reaction to high acidity foods.     Review of Systems  Constitutional:  Positive for chills and fatigue. Negative for fever.  HENT:  Positive for congestion and rhinorrhea. Negative for ear pain, hearing loss, postnasal drip, sinus pressure and sore throat.        Loss of smell and taste  Eyes: Negative.   Respiratory:  Positive for cough. Negative for shortness of breath.   Cardiovascular: Negative.   Gastrointestinal: Negative.   Genitourinary: Negative.   Musculoskeletal:  Positive for myalgias.  Skin: Negative.   Neurological: Negative.       Objective:    Physical Exam Vitals and nursing note reviewed.  Pulmonary:     Comments: Able to talk in complete sentences Neurological:     Mental Status: She is oriented to person, place, and time.  Psychiatric:        Thought Content: Thought content normal.    LMP  (LMP Unknown)  Wt Readings from Last 3 Encounters:  11/29/20 152 lb (68.9 kg)  11/01/20 161 lb 3.2 oz (  73.1 kg)  10/23/20 155 lb (70.3 kg)    Health Maintenance Due  Topic Date Due    OPHTHALMOLOGY EXAM  04/13/2020   COVID-19 Vaccine (4 - Booster for Pfizer series) 08/04/2020    There are no preventive care reminders to display for this patient.   Lab Results  Component Value Date   TSH 0.666 08/24/2020   Lab Results  Component Value Date   WBC 5.2 10/27/2020   HGB 9.1 (L) 10/27/2020   HCT 29.3 (L) 10/27/2020   MCV 78.8 (L) 10/27/2020   PLT 177 10/27/2020   Lab Results  Component Value Date   NA 144 11/01/2020   K 4.3 11/01/2020   CO2 19 (L) 11/01/2020   GLUCOSE 164 (H) 11/01/2020   BUN 20 11/01/2020   CREATININE 1.56 (H) 11/01/2020   BILITOT 0.5 10/24/2020   ALKPHOS 54 10/24/2020   AST 23 10/24/2020   ALT 22 10/24/2020   PROT 6.0 (L) 10/24/2020   ALBUMIN 3.3 (L) 10/26/2020   CALCIUM 9.9 11/01/2020   ANIONGAP 6 10/27/2020   EGFR 34 (L) 11/01/2020   Lab Results  Component Value Date   CHOL 193 08/24/2020   Lab Results  Component Value Date   HDL 50 08/24/2020   Lab Results  Component Value Date   LDLCALC 126 (H) 08/24/2020   Lab Results  Component Value Date   TRIG 94 08/24/2020   No results found for: [facility] Lab Results  Component Value Date   HGBA1C 6.8 11/29/2020       Assessment & Plan:   Problem List Items Addressed This Visit       Other   COVID-19 - Primary    Positive home test. Symptoms started on Tuesday. Will treat with molnupiravir and tessalon prn for cough. Encouraged her to rest and drink plenty of fluids. She can take tylenol as needed for her headache. She needs to isolate for 10 days. Call us or go to ER if symptoms worsen or develop shortness of breath or chest pain. Follow-up in office in 1 week after isolation period.   Addendum 1300: Received a message from Santiago Glad at the pharmacy saying that the patient says her covid-19 test was negative, but her niece who helped administer the test said there was a light pink line. Will have her come in for a covid-19 test in office.        Relevant Medications    molnupiravir EUA 200 mg CAPS   Other Relevant Orders   Novel Coronavirus, NAA ([facility])     Meds ordered this encounter  Medications   molnupiravir EUA 200 mg CAPS    Sig: Take 4 capsules (800 mg total) by mouth 2 (two) times daily for 5 days.    Dispense:  40 capsule    Refill:  0   benzonatate (TESSALON) 100 MG capsule    Sig: Take 1 capsule (100 mg total) by mouth 3 (three) times daily as needed for cough.    Dispense:  20 capsule    Refill:  0    This visit was completed via telephone due to the restrictions of the COVID-19 pandemic. All issues as above were discussed and addressed but no physical exam was performed. If it was felt that the patient should be evaluated in the office, they were directed there. The patient verbally consented to this visit. Patient was unable to complete an audio/visual visit due to Technical difficulties", "Lack of internet. Due to the catastrophic nature of the  COVID-19 pandemic, this visit was done through audio contact only. Location of the patient: home Location of the provider: work Those involved with this call:  Provider: Billy Fischer, DNP CMA: Frazier Butt, [place] Desk/Registration: Jill Side  Time spent on call:  15 minutes on the phone discussing health concerns. 10 minutes total spent in review of patient's record and preparation of their chart.   Charyl Dancer, NP

## 2020-12-22 LAB — NOVEL CORONAVIRUS, NAA: SARS-CoV-2, NAA: NOT DETECTED

## 2020-12-22 LAB — SARS-COV-2, NAA 2 DAY TAT

## 2020-12-26 ENCOUNTER — Other Ambulatory Visit: Payer: Self-pay

## 2020-12-26 ENCOUNTER — Ambulatory Visit (INDEPENDENT_AMBULATORY_CARE_PROVIDER_SITE_OTHER): Payer: Medicare Other | Admitting: Nurse Practitioner

## 2020-12-26 ENCOUNTER — Encounter: Payer: Self-pay | Admitting: Nurse Practitioner

## 2020-12-26 VITALS — BP 111/64 | HR 81 | Temp 98.7°F | Wt 148.2 lb

## 2020-12-26 DIAGNOSIS — R5383 Other fatigue: Secondary | ICD-10-CM | POA: Diagnosis not present

## 2020-12-26 DIAGNOSIS — M2550 Pain in unspecified joint: Secondary | ICD-10-CM | POA: Diagnosis not present

## 2020-12-26 DIAGNOSIS — R8281 Pyuria: Secondary | ICD-10-CM | POA: Diagnosis not present

## 2020-12-26 MED ORDER — DOXYCYCLINE HYCLATE 100 MG PO TABS: 100.0000 mg | ORAL_TABLET | Freq: Two times a day (BID) | ORAL | 0 refills | Status: AC

## 2020-12-26 MED ORDER — PREDNISONE 20 MG PO TABS: 40.0000 mg | ORAL_TABLET | Freq: Every day | ORAL | 0 refills | Status: AC

## 2020-12-26 NOTE — Progress Notes (Signed)
BP 111/64   Pulse 81   Temp 98.7 F (37.1 C) (Oral)   Wt 148 lb 3.2 oz (67.2 kg)   LMP  (LMP Unknown)   SpO2 97%   BMI 27.11 kg/m    Subjective:    Patient ID: Tina Mcdonald, female    DOB: [birth year], 80 y.o.   MRN: [id number]  HPI: Tina Mcdonald is a 80 y.o. female  Chief Complaint  Patient presents with   Tingling    Patient states she feels like something is stiff in the leg and patient first noticed a while ago and noticed worsening yesterday and today. Patient states when she stands up straight she notices pain that radiates up her back and into her shoulders. Patient states she tried OTC Tylenol. Patient states that isn't helping at all.    Fatigue    Patient states in the mornings she notices when she gets up and trying to walk down here hallway she notices that she is unbalance and swaying from side to side and she said she can be typing with her phone and her hands will start to tremor. Patient states she feels her body is trying to tell her something but she doesn't know.    JOINT PAIN Notices all over joint pain, but worse in both legs.  Has been feeling worse since Monday == this is week 3 of symptoms she thinks but has noticed more over past week when she was sick.  She was recently sick -- but tested negative for Covid 12/21/20 -- she only took a couple doses of Molnupiravir prior to negative testing.  Has recent worsening kidney decline and is being followed by nephrology due to AKI 10/23/20, prior to this kidney function had been stable.  Saw nephrology last 11/22/20 with labs CRT 1.72, eGFR 32, PTH 63, H/H 10.2/32.6.   Duration: weeks Pain: yes Symmetric: yes  6/10 Quality: dull and aching Frequency: intermittent Context:  fluctuating Decreased function/range of motion: some Erythema: none Swelling: none Heat or warmth: none Morning stiffness: yes Aggravating factors: unknown Alleviating factors: nothing Relief with NSAIDs?: No NSAIDs Taken Treatments  attempted:  APAP  Involved Joints:     Hands: none    Wrists: none    Elbows: none    Shoulders: yes bilateral    Back: yes     Hips: yes bilateral    Knees: none -- bilateral legs are hurting    Ankles: yes bilateral    Feet: yes bilateral  FATIGUE Started feeling really tired about 3 weeks ago with a cough.  Continues to have cough intermittent throughout daytime and evening.  She endorses some wheezing and headaches.  Lost taste last week and it has not returned.  Denies fever, night sweats, or reduced appetite.  Has lost 13 pounds since 11/02/20 on review.  Recent A1c 6.8% in June. Duration:  weeks Severity: moderate  Onset: gradual Context when symptoms started:   URI Symptoms improve with rest: yes  Depressive symptoms: no Stress/anxiety: no Insomnia: none Snoring: no Observed apnea by bed partner: no Daytime hypersomnolence:no Wakes feeling refreshed: no History of sleep study: no Dysnea on exertion:  occasional Orthopnea/PND: no Chest pain: no Chronic cough:  x 3 weeks Lower extremity edema: no Arthralgias:yes Myalgias: yes Weakness: yes Rash: no   Relevant past medical, surgical, family and social history reviewed and updated as indicated. Interim medical history since our last visit reviewed. Allergies and medications reviewed and updated.  Review of Systems  Constitutional:  Positive for fatigue. Negative for activity change, appetite change, chills, diaphoresis and fever.  HENT:  Positive for congestion and rhinorrhea. Negative for ear discharge, ear pain, postnasal drip, sinus pressure, sinus pain and voice change.   Respiratory:  Positive for cough, shortness of breath and wheezing. Negative for chest tightness.   Cardiovascular:  Negative for chest pain, palpitations and leg swelling.  Gastrointestinal: Negative.   Endocrine: Negative.   Musculoskeletal:  Positive for arthralgias.  Neurological:  Positive for weakness. Negative for dizziness, syncope,  light-headedness, numbness and headaches.  Psychiatric/Behavioral: Negative.     Per HPI unless specifically indicated above     Objective:    BP 111/64   Pulse 81   Temp 98.7 F (37.1 C) (Oral)   Wt 148 lb 3.2 oz (67.2 kg)   LMP  (LMP Unknown)   SpO2 97%   BMI 27.11 kg/m   Wt Readings from Last 3 Encounters:  12/26/20 148 lb 3.2 oz (67.2 kg)  11/29/20 152 lb (68.9 kg)  11/01/20 161 lb 3.2 oz (73.1 kg)    Physical Exam Vitals and nursing note reviewed.  Constitutional:      General: She is awake. She is not in acute distress.    Appearance: She is well-developed, well-groomed and overweight. She is ill-appearing. She is not toxic-appearing.  HENT:     Head: Normocephalic.     Right Ear: Hearing, tympanic membrane, ear canal and external ear normal.     Left Ear: Hearing, tympanic membrane, ear canal and external ear normal.     Nose: Nose normal.     Right Sinus: No maxillary sinus tenderness or frontal sinus tenderness.     Left Sinus: No maxillary sinus tenderness or frontal sinus tenderness.     Mouth/Throat:     Mouth: Mucous membranes are moist.     Pharynx: Oropharynx is clear.  Eyes:     General: Lids are normal.        Right eye: No discharge.        Left eye: No discharge.     Extraocular Movements: Extraocular movements intact.     Conjunctiva/sclera: Conjunctivae normal.     Pupils: Pupils are equal, round, and reactive to light.  Neck:     Thyroid: No thyromegaly.     Vascular: No carotid bruit.  Cardiovascular:     Rate and Rhythm: Normal rate and regular rhythm.     Heart sounds: Normal heart sounds. No murmur heard.   No gallop.  Pulmonary:     Effort: Pulmonary effort is normal. No accessory muscle usage or respiratory distress.     Breath sounds: Examination of the right-lower field reveals rhonchi. Rhonchi present. No wheezing.     Comments: Talkative without SOB.  Rhonchi noted to RLL, no other locations.  No wheezes or diminished sounds  noted. Abdominal:     General: Bowel sounds are normal.     Palpations: Abdomen is soft.  Musculoskeletal:     Cervical back: Normal range of motion and neck supple.     Right lower leg: No edema.     Left lower leg: No edema.  Lymphadenopathy:     Cervical: No cervical adenopathy.  Skin:    General: Skin is warm and dry.  Neurological:     Mental Status: She is alert and oriented to person, place, and time.     Deep Tendon Reflexes: Reflexes are normal and symmetric.     Reflex Scores:  Brachioradialis reflexes are 2+ on the right side and 2+ on the left side.      Patellar reflexes are 2+ on the right side and 2+ on the left side. Psychiatric:        Attention and Perception: Attention normal.        Mood and Affect: Mood normal.        Speech: Speech normal.        Behavior: Behavior normal. Behavior is cooperative.        Thought Content: Thought content normal.     Comments: More fatigued then baseline today.    Results for orders placed or performed in visit on 12/21/20  Novel Coronavirus, NAA ([facility])   Specimen: Nasopharyngeal(NP) swabs in vial transport medium  Result Value Ref Range   SARS-CoV-2, NAA Not Detected Not Detected  SARS-COV-2, NAA 2 DAY TAT  Result Value Ref Range   SARS-CoV-2, NAA 2 DAY TAT Performed       Assessment & Plan:   Problem List Items Addressed This Visit       Other   Fatigue - Primary    Ongoing she reports for 3 weeks with worsening and recent increased cough.  Concern for PNA, her Covid testing returned negative recently, but will repeat today due to ongoing symptoms, including loss of taste.  CXR ordered and educated patient on where to obtain.  Will start Doxycyline at this time 100 MG BID and Prednisone 40 MG daily for 5 days, may add on second abx if CXR returns noting PNA.  Obtain labs today to include CBC, CMP, ESR, CK, CRP, ANA, urinalysis, and TSH.  Have return in 2 days if Covid testing negative can see in office, if +  can do virtual.         Relevant Orders   DG Chest 2 View   CBC with Differential/Platelet   Comprehensive metabolic panel   TSH   Novel Coronavirus, NAA ([facility])   Arthralgia    Over past 3 weeks with worsening since last week with URI.  At this time concern for PNA, refer to fatigue plan of care for further.  Return in 2 days for follow-up.       Relevant Orders   CK (Creatine Kinase)   C-reactive protein   Sed Rate (ESR)   ANA w/Reflex if Positive   Urinalysis, Routine w reflex microscopic   Novel Coronavirus, NAA ([facility])    Time: 25 minutes, >50% spent counseling/or care coordination with her current symptoms   Follow up plan: Return in about 2 days (around 12/28/2020) for Fatigue and cough.

## 2020-12-26 NOTE — Assessment & Plan Note (Addendum)
Ongoing she reports for 3 weeks with worsening and recent increased cough.  Concern for PNA, her Covid testing returned negative recently, but will repeat today due to ongoing symptoms, including loss of taste.  CXR ordered and educated patient on where to obtain.  Will start Doxycyline at this time 100 MG BID and Prednisone 40 MG daily for 5 days, may add on second abx if CXR returns noting PNA.  Obtain labs today to include CBC, CMP, ESR, CK, CRP, ANA, urinalysis, and TSH.  Have return in 2 days if Covid testing negative can see in office, if + can do virtual.

## 2020-12-26 NOTE — Assessment & Plan Note (Signed)
Over past 3 weeks with worsening since last week with URI.  At this time concern for PNA, refer to fatigue plan of care for further.  Return in 2 days for follow-up.

## 2020-12-26 NOTE — Patient Instructions (Signed)

## 2020-12-27 ENCOUNTER — Telehealth: Payer: Self-pay | Admitting: Nurse Practitioner

## 2020-12-27 ENCOUNTER — Other Ambulatory Visit: Payer: Self-pay

## 2020-12-27 DIAGNOSIS — Z515 Encounter for palliative care: Secondary | ICD-10-CM | POA: Diagnosis not present

## 2020-12-27 DIAGNOSIS — Z7984 Long term (current) use of oral hypoglycemic drugs: Secondary | ICD-10-CM | POA: Diagnosis not present

## 2020-12-27 DIAGNOSIS — M109 Gout, unspecified: Secondary | ICD-10-CM | POA: Diagnosis not present

## 2020-12-27 DIAGNOSIS — D509 Iron deficiency anemia, unspecified: Secondary | ICD-10-CM | POA: Diagnosis not present

## 2020-12-27 DIAGNOSIS — R8281 Pyuria: Secondary | ICD-10-CM | POA: Diagnosis not present

## 2020-12-27 DIAGNOSIS — I129 Hypertensive chronic kidney disease with stage 1 through stage 4 chronic kidney disease, or unspecified chronic kidney disease: Secondary | ICD-10-CM | POA: Diagnosis not present

## 2020-12-27 DIAGNOSIS — Z91041 Radiographic dye allergy status: Secondary | ICD-10-CM | POA: Diagnosis not present

## 2020-12-27 DIAGNOSIS — D649 Anemia, unspecified: Secondary | ICD-10-CM | POA: Diagnosis not present

## 2020-12-27 DIAGNOSIS — R079 Chest pain, unspecified: Secondary | ICD-10-CM | POA: Diagnosis not present

## 2020-12-27 DIAGNOSIS — J069 Acute upper respiratory infection, unspecified: Secondary | ICD-10-CM | POA: Diagnosis not present

## 2020-12-27 DIAGNOSIS — R110 Nausea: Secondary | ICD-10-CM | POA: Diagnosis not present

## 2020-12-27 DIAGNOSIS — M10062 Idiopathic gout, left knee: Secondary | ICD-10-CM | POA: Diagnosis not present

## 2020-12-27 DIAGNOSIS — N141 Nephropathy induced by other drugs, medicaments and biological substances: Secondary | ICD-10-CM | POA: Diagnosis not present

## 2020-12-27 DIAGNOSIS — N178 Other acute kidney failure: Secondary | ICD-10-CM | POA: Diagnosis not present

## 2020-12-27 DIAGNOSIS — Z87891 Personal history of nicotine dependence: Secondary | ICD-10-CM | POA: Diagnosis not present

## 2020-12-27 DIAGNOSIS — N289 Disorder of kidney and ureter, unspecified: Secondary | ICD-10-CM | POA: Diagnosis not present

## 2020-12-27 DIAGNOSIS — R945 Abnormal results of liver function studies: Secondary | ICD-10-CM | POA: Diagnosis not present

## 2020-12-27 DIAGNOSIS — Z882 Allergy status to sulfonamides status: Secondary | ICD-10-CM | POA: Diagnosis not present

## 2020-12-27 DIAGNOSIS — R634 Abnormal weight loss: Secondary | ICD-10-CM | POA: Diagnosis not present

## 2020-12-27 DIAGNOSIS — R748 Abnormal levels of other serum enzymes: Secondary | ICD-10-CM | POA: Insufficient documentation

## 2020-12-27 DIAGNOSIS — R059 Cough, unspecified: Secondary | ICD-10-CM | POA: Diagnosis not present

## 2020-12-27 DIAGNOSIS — I10 Essential (primary) hypertension: Secondary | ICD-10-CM | POA: Diagnosis not present

## 2020-12-27 DIAGNOSIS — M25562 Pain in left knee: Secondary | ICD-10-CM | POA: Diagnosis not present

## 2020-12-27 DIAGNOSIS — N170 Acute kidney failure with tubular necrosis: Secondary | ICD-10-CM | POA: Diagnosis not present

## 2020-12-27 DIAGNOSIS — E872 Acidosis: Secondary | ICD-10-CM | POA: Diagnosis not present

## 2020-12-27 DIAGNOSIS — Z91040 Latex allergy status: Secondary | ICD-10-CM | POA: Diagnosis not present

## 2020-12-27 DIAGNOSIS — E1122 Type 2 diabetes mellitus with diabetic chronic kidney disease: Secondary | ICD-10-CM | POA: Diagnosis not present

## 2020-12-27 DIAGNOSIS — E876 Hypokalemia: Secondary | ICD-10-CM | POA: Diagnosis not present

## 2020-12-27 DIAGNOSIS — R1013 Epigastric pain: Secondary | ICD-10-CM | POA: Diagnosis not present

## 2020-12-27 DIAGNOSIS — E119 Type 2 diabetes mellitus without complications: Secondary | ICD-10-CM | POA: Diagnosis not present

## 2020-12-27 DIAGNOSIS — N183 Chronic kidney disease, stage 3 unspecified: Secondary | ICD-10-CM | POA: Diagnosis not present

## 2020-12-27 DIAGNOSIS — Z452 Encounter for adjustment and management of vascular access device: Secondary | ICD-10-CM | POA: Diagnosis not present

## 2020-12-27 DIAGNOSIS — M1712 Unilateral primary osteoarthritis, left knee: Secondary | ICD-10-CM | POA: Diagnosis not present

## 2020-12-27 DIAGNOSIS — M25462 Effusion, left knee: Secondary | ICD-10-CM | POA: Diagnosis not present

## 2020-12-27 DIAGNOSIS — M6282 Rhabdomyolysis: Secondary | ICD-10-CM | POA: Diagnosis not present

## 2020-12-27 DIAGNOSIS — R0602 Shortness of breath: Secondary | ICD-10-CM | POA: Diagnosis not present

## 2020-12-27 DIAGNOSIS — E785 Hyperlipidemia, unspecified: Secondary | ICD-10-CM | POA: Diagnosis not present

## 2020-12-27 DIAGNOSIS — R0989 Other specified symptoms and signs involving the circulatory and respiratory systems: Secondary | ICD-10-CM | POA: Diagnosis not present

## 2020-12-27 DIAGNOSIS — Z20822 Contact with and (suspected) exposure to covid-19: Secondary | ICD-10-CM | POA: Diagnosis not present

## 2020-12-27 DIAGNOSIS — N179 Acute kidney failure, unspecified: Secondary | ICD-10-CM | POA: Diagnosis not present

## 2020-12-27 DIAGNOSIS — R058 Other specified cough: Secondary | ICD-10-CM | POA: Diagnosis not present

## 2020-12-27 DIAGNOSIS — M25561 Pain in right knee: Secondary | ICD-10-CM | POA: Diagnosis not present

## 2020-12-27 DIAGNOSIS — R7989 Other specified abnormal findings of blood chemistry: Secondary | ICD-10-CM | POA: Diagnosis not present

## 2020-12-27 LAB — URINALYSIS, ROUTINE W REFLEX MICROSCOPIC
Bilirubin, UA: NEGATIVE
Glucose, UA: NEGATIVE
Leukocytes,UA: NEGATIVE
Nitrite, UA: NEGATIVE
Specific Gravity, UA: 1.015 (ref 1.005–1.030)
Urobilinogen, Ur: 0.2 mg/dL (ref 0.2–1.0)
pH, UA: 5 (ref 5.0–7.5)

## 2020-12-27 LAB — MICROSCOPIC EXAMINATION: WBC, UA: NONE SEEN /hpf (ref 0–5)

## 2020-12-27 LAB — SARS-COV-2, NAA 2 DAY TAT

## 2020-12-27 LAB — NOVEL CORONAVIRUS, NAA: SARS-CoV-2, NAA: NOT DETECTED

## 2020-12-27 NOTE — Addendum Note (Signed)
Addended byRudie Meyer on: 12/27/2020 10:28 AM   Modules accepted: Orders

## 2020-12-27 NOTE — Telephone Encounter (Signed)
Please advise 

## 2020-12-27 NOTE — Telephone Encounter (Signed)
Spoke to patient on phone this morning and have advised ER visit today, possible admission needed, as kidney function showing presence of AKI again and she has been feeling unwell with this.  Concern for possible UTI or stones on urine sample and possible PNA as mild rhonchi noted RLL yesterday.  Inflammatory markers also elevated again.  She is aware of recommendation and plans to attend ER today after rearranging schedule this morning.  She stated appreciation for call.  Will update provider she is to see Friday and alert them of possible admission.

## 2020-12-27 NOTE — Addendum Note (Signed)
Addended by: Marnee Guarneri T on: 12/27/2020 07:15 AM   Modules accepted: Orders

## 2020-12-27 NOTE — Telephone Encounter (Signed)
She is on her way to [facility] ER. Can someone please call them to let them know that she has been fatigued, has a cough with negative covid-19 test. She has a history of acute kidney injury in May that was treated with hospital admission and IV fluids, however now her creatinine is 6 which is higher than it was before.

## 2020-12-27 NOTE — Progress Notes (Signed)
Review telephone note 12/27/20 -- advised patient to head to ER for further assessment due to current AKI.

## 2020-12-27 NOTE — Telephone Encounter (Signed)
ER informed.

## 2020-12-27 NOTE — Telephone Encounter (Signed)
Pt stated she spoke with Jolene this morning and was advised to go the ER. She wanted to let Jolene know she is going to [facility] ER. She would like to know if Jolene could giver her a call back this morning. Please advise.

## 2020-12-28 ENCOUNTER — Ambulatory Visit: Payer: Medicare Other | Admitting: Nurse Practitioner

## 2020-12-28 LAB — CBC WITH DIFFERENTIAL/PLATELET
Basophils Absolute: 0 10*3/uL (ref 0.0–0.2)
Basos: 0 %
EOS (ABSOLUTE): 0.1 10*3/uL (ref 0.0–0.4)
Eos: 1 %
Hematocrit: 30.7 % — ABNORMAL LOW (ref 34.0–46.6)
Hemoglobin: 9.6 g/dL — ABNORMAL LOW (ref 11.1–15.9)
Immature Grans (Abs): 0.1 10*3/uL (ref 0.0–0.1)
Immature Granulocytes: 1 %
Lymphocytes Absolute: 2.2 10*3/uL (ref 0.7–3.1)
Lymphs: 21 %
MCH: 23.9 pg — ABNORMAL LOW (ref 26.6–33.0)
MCHC: 31.3 g/dL — ABNORMAL LOW (ref 31.5–35.7)
MCV: 77 fL — ABNORMAL LOW (ref 79–97)
Monocytes Absolute: 0.7 10*3/uL (ref 0.1–0.9)
Monocytes: 7 %
Neutrophils Absolute: 7.6 10*3/uL — ABNORMAL HIGH (ref 1.4–7.0)
Neutrophils: 70 %
Platelets: 360 10*3/uL (ref 150–450)
RBC: 4.01 x10E6/uL (ref 3.77–5.28)
RDW: 15.3 % (ref 11.7–15.4)
WBC: 10.8 10*3/uL (ref 3.4–10.8)

## 2020-12-28 LAB — COMPREHENSIVE METABOLIC PANEL
ALT: 95 IU/L — ABNORMAL HIGH (ref 0–32)
AST: 152 IU/L — ABNORMAL HIGH (ref 0–40)
Albumin/Globulin Ratio: 1.2 (ref 1.2–2.2)
Albumin: 4.1 g/dL (ref 3.7–4.7)
Alkaline Phosphatase: 85 IU/L (ref 44–121)
BUN/Creatinine Ratio: 8 — ABNORMAL LOW (ref 12–28)
BUN: 51 mg/dL — ABNORMAL HIGH (ref 8–27)
Bilirubin Total: 0.2 mg/dL (ref 0.0–1.2)
CO2: 16 mmol/L — ABNORMAL LOW (ref 20–29)
Calcium: 9.4 mg/dL (ref 8.7–10.3)
Chloride: 103 mmol/L (ref 96–106)
Creatinine, Ser: 6.08 mg/dL — ABNORMAL HIGH (ref 0.57–1.00)
Globulin, Total: 3.4 g/dL (ref 1.5–4.5)
Glucose: 102 mg/dL — ABNORMAL HIGH (ref 65–99)
Potassium: 3.7 mmol/L (ref 3.5–5.2)
Sodium: 144 mmol/L (ref 134–144)
Total Protein: 7.5 g/dL (ref 6.0–8.5)
eGFR: 7 mL/min/{1.73_m2} — ABNORMAL LOW (ref >59)

## 2020-12-28 LAB — CK: Total CK: 6671 U/L (ref 32–182)

## 2020-12-28 LAB — TSH: TSH: 0.34 u[IU]/mL — ABNORMAL LOW (ref 0.450–4.500)

## 2020-12-28 LAB — C-REACTIVE PROTEIN: CRP: 43 mg/L — ABNORMAL HIGH (ref 0–10)

## 2020-12-28 LAB — SEDIMENTATION RATE: Sed Rate: 68 mm/hr — ABNORMAL HIGH (ref 0–40)

## 2020-12-28 LAB — ANA W/REFLEX IF POSITIVE: Anti Nuclear Antibody (ANA): NEGATIVE

## 2020-12-30 LAB — URINE CULTURE

## 2020-12-31 ENCOUNTER — Telehealth: Payer: Self-pay

## 2020-12-31 NOTE — Telephone Encounter (Signed)
Unable to lvm to make this apt/  

## 2020-12-31 NOTE — Telephone Encounter (Signed)
-----   Message from Charyl Dancer, NP sent at 12/21/2020 10:59 AM EDT ----- Can you call her to schedule a visit in office on July 11 or 12 for follow-up

## 2021-01-03 NOTE — Telephone Encounter (Signed)
Unable to lvm to make this apt/  

## 2021-01-05 ENCOUNTER — Other Ambulatory Visit: Payer: Self-pay | Admitting: Nurse Practitioner

## 2021-01-05 ENCOUNTER — Encounter: Payer: Self-pay | Admitting: Nurse Practitioner

## 2021-01-05 DIAGNOSIS — M6282 Rhabdomyolysis: Secondary | ICD-10-CM | POA: Insufficient documentation

## 2021-01-07 DIAGNOSIS — R809 Proteinuria, unspecified: Secondary | ICD-10-CM | POA: Diagnosis not present

## 2021-01-07 DIAGNOSIS — N179 Acute kidney failure, unspecified: Secondary | ICD-10-CM | POA: Diagnosis not present

## 2021-01-07 DIAGNOSIS — I10 Essential (primary) hypertension: Secondary | ICD-10-CM | POA: Diagnosis not present

## 2021-01-08 NOTE — Telephone Encounter (Signed)
Pt declined apt.

## 2021-01-14 ENCOUNTER — Telehealth: Payer: Self-pay

## 2021-01-14 NOTE — Telephone Encounter (Signed)
Copied from [place] [id number]. Topic: Appointment Scheduling - Scheduling Inquiry for Clinic >> Jan 14, 2021  2:20 PM Bayard Beaver wrote: Reason for [id number] called in needs to cancel appt for 07/26 with case manager, she has another appt somewhere else tomorrow morning

## 2021-01-15 ENCOUNTER — Telehealth: Payer: Self-pay

## 2021-01-15 DIAGNOSIS — D509 Iron deficiency anemia, unspecified: Secondary | ICD-10-CM | POA: Insufficient documentation

## 2021-01-15 DIAGNOSIS — D649 Anemia, unspecified: Secondary | ICD-10-CM | POA: Diagnosis not present

## 2021-01-15 DIAGNOSIS — N179 Acute kidney failure, unspecified: Secondary | ICD-10-CM | POA: Diagnosis not present

## 2021-01-24 DIAGNOSIS — D631 Anemia in chronic kidney disease: Secondary | ICD-10-CM | POA: Diagnosis not present

## 2021-01-24 DIAGNOSIS — N2581 Secondary hyperparathyroidism of renal origin: Secondary | ICD-10-CM | POA: Diagnosis not present

## 2021-01-24 DIAGNOSIS — R809 Proteinuria, unspecified: Secondary | ICD-10-CM | POA: Diagnosis not present

## 2021-01-24 DIAGNOSIS — I10 Essential (primary) hypertension: Secondary | ICD-10-CM | POA: Diagnosis not present

## 2021-01-24 DIAGNOSIS — N179 Acute kidney failure, unspecified: Secondary | ICD-10-CM | POA: Diagnosis not present

## 2021-01-31 DIAGNOSIS — E875 Hyperkalemia: Secondary | ICD-10-CM | POA: Diagnosis not present

## 2021-01-31 DIAGNOSIS — D508 Other iron deficiency anemias: Secondary | ICD-10-CM | POA: Diagnosis not present

## 2021-02-07 DIAGNOSIS — D508 Other iron deficiency anemias: Secondary | ICD-10-CM | POA: Diagnosis not present

## 2021-02-23 ENCOUNTER — Encounter: Payer: Self-pay | Admitting: Nurse Practitioner

## 2021-03-01 ENCOUNTER — Telehealth: Payer: Self-pay

## 2021-03-01 ENCOUNTER — Ambulatory Visit (INDEPENDENT_AMBULATORY_CARE_PROVIDER_SITE_OTHER): Payer: Medicare Other | Admitting: Nurse Practitioner

## 2021-03-01 ENCOUNTER — Other Ambulatory Visit: Payer: Self-pay

## 2021-03-01 ENCOUNTER — Encounter: Payer: Self-pay | Admitting: Nurse Practitioner

## 2021-03-01 VITALS — BP 115/68 | HR 68 | Temp 98.6°F | Wt 153.8 lb

## 2021-03-01 DIAGNOSIS — R011 Cardiac murmur, unspecified: Secondary | ICD-10-CM | POA: Insufficient documentation

## 2021-03-01 DIAGNOSIS — E1169 Type 2 diabetes mellitus with other specified complication: Secondary | ICD-10-CM | POA: Diagnosis not present

## 2021-03-01 DIAGNOSIS — E1122 Type 2 diabetes mellitus with diabetic chronic kidney disease: Secondary | ICD-10-CM | POA: Diagnosis not present

## 2021-03-01 DIAGNOSIS — N183 Chronic kidney disease, stage 3 unspecified: Secondary | ICD-10-CM | POA: Diagnosis not present

## 2021-03-01 DIAGNOSIS — Z888 Allergy status to other drugs, medicaments and biological substances status: Secondary | ICD-10-CM | POA: Diagnosis not present

## 2021-03-01 DIAGNOSIS — I152 Hypertension secondary to endocrine disorders: Secondary | ICD-10-CM | POA: Diagnosis not present

## 2021-03-01 DIAGNOSIS — I700 Atherosclerosis of aorta: Secondary | ICD-10-CM | POA: Diagnosis not present

## 2021-03-01 DIAGNOSIS — E1159 Type 2 diabetes mellitus with other circulatory complications: Secondary | ICD-10-CM | POA: Diagnosis not present

## 2021-03-01 DIAGNOSIS — E785 Hyperlipidemia, unspecified: Secondary | ICD-10-CM | POA: Diagnosis not present

## 2021-03-01 DIAGNOSIS — N1832 Chronic kidney disease, stage 3b: Secondary | ICD-10-CM | POA: Diagnosis not present

## 2021-03-01 DIAGNOSIS — E1149 Type 2 diabetes mellitus with other diabetic neurological complication: Secondary | ICD-10-CM

## 2021-03-01 DIAGNOSIS — D631 Anemia in chronic kidney disease: Secondary | ICD-10-CM

## 2021-03-01 DIAGNOSIS — I517 Cardiomegaly: Secondary | ICD-10-CM | POA: Insufficient documentation

## 2021-03-01 LAB — BAYER DCA HB A1C WAIVED: HB A1C (BAYER DCA - WAIVED): 5.4 % (ref 4.8–5.6)

## 2021-03-01 NOTE — Addendum Note (Signed)
Addended by: Marnee Guarneri T on: 03/01/2021 10:50 AM   Modules accepted: Orders

## 2021-03-01 NOTE — Assessment & Plan Note (Signed)
Chronic, ongoing, unable to take statin due to rhabdo with these and subsequent AKI with ongoing CKD now.  Check Lipid panel today, could consider Repatha in upcoming months if cholesterol elevations.  Discussed with patient. Return in 3 months.

## 2021-03-01 NOTE — Assessment & Plan Note (Signed)
Moderate on echo in 2019 -- will repeat echo and recommend she return to cardiology.

## 2021-03-01 NOTE — Assessment & Plan Note (Signed)
With rhabdo and AKI -- consider Repatha if medication needed in future.

## 2021-03-01 NOTE — Assessment & Plan Note (Signed)
Repeat echo and recommend she return to cardiology for follow-up.

## 2021-03-01 NOTE — Assessment & Plan Note (Signed)
Chronic, ongoing.  Continue current medication regimen and adjust as needed.  Continue collaboration with nephrology locally and at [facility], recent notes and labs reviewed.  Return in 3 months.

## 2021-03-01 NOTE — Assessment & Plan Note (Signed)
Chronic, ongoing with BP at goal today and at goal at home. Recommend she monitor BP at least a few mornings a week at home and document.  DASH diet at home.  Continue current medication regimen and adjust as needed.  Labs next visit: BMP, Mag, lipid.  Return in 3 months.

## 2021-03-01 NOTE — Telephone Encounter (Signed)
Patient notified and verbalized understanding and has no further questions at this time.

## 2021-03-01 NOTE — Assessment & Plan Note (Signed)
Chronic, ongoing.  Continue current medication regimen and collaboration with hematology and nephrology, recent notes and labs reviewed.

## 2021-03-01 NOTE — Assessment & Plan Note (Signed)
Chronic, ongoing with A1c 5.4%.  Continue diet control and weight management at this time + Gabapentin for neuropathy + continuing to monitor blood sugar daily.  BMP today to ensure no further renal dosing needed and will adjust medication doses as needed. Return in 3 months.

## 2021-03-01 NOTE — Telephone Encounter (Signed)
Copied from [place] [id number]. Topic: General - Other >> Mar 01, 2021 10:34 AM Leward Quan A wrote: Reason for CRM: Patient called in to inquire of Marnee Guarneri does she still need to take the B-12 shot please call back with an answer to Ph# [phone]   Routing to provider to advise.

## 2021-03-01 NOTE — Patient Instructions (Signed)

## 2021-03-01 NOTE — Progress Notes (Addendum)
BP 115/68   Pulse 68   Temp 98.6 F (37 C) (Oral)   Wt 153 lb 12.8 oz (69.8 kg)   LMP  (LMP Unknown)   SpO2 98%   BMI 28.13 kg/m    Subjective:    Patient ID: Tina Mcdonald, female    DOB: [birth year], 80 y.o.   MRN: [id number]  HPI: Tina Mcdonald is a 80 y.o. female  Chief Complaint  Patient presents with   Diabetes    Patient states she checks her readings and states they have been fine. Patient denies having a recent Diabetic Eye Exam as she is searching for a new eye doctor.    Hyperlipidemia   Hypertension   Vitamin D   Chronic Kidney Disease   DIABETES Last A1c in June was 6.8%.  No current medications for diabetes, is focused on diet.     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 Hypoglycemic episodes:no Polydipsia/polyuria: no Visual disturbance: no Chest pain: no Paresthesias: no Glucose Monitoring: yes             Accucheck frequency: every other day             Fasting glucose: occasional 180-190, often in 110 range             Post prandial:             Evening:             Before meals: Taking Insulin?: no             Long acting insulin:             Short acting insulin: Blood Pressure Monitoring: not checking Retinal Examination: Up To Date -- Dr. Bell, [place] Exam: Up to Date Pneumovax: Up to Date Influenza: Up to Date Aspirin: no    HYPERTENSION / HYPERLIPIDEMIA Continues on Amlodipine, Carvedilol, Lasix, Irbesartan.  Last saw Dr. Clayborn Bigness 03/22/2019.  Has history of aortic atherosclerosis noted on past CT 01/26/17.  Was taken off statin therapy due to rhabdo with subsequent CKD.  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 Satisfied with current treatment? yes Duration of hypertension: chronic BP monitoring frequency: once a week BP range: 120/70 range BP medication side effects: no Duration of hyperlipidemia: chronic Cholesterol medication side effects:  no Cholesterol supplements: none Medication compliance: good compliance Aspirin: no Recent stressors: no Recurrent headaches: no Visual changes: no Palpitations: no Dyspnea: occasional since Covid -- with heavy exertion Chest pain: no Lower extremity edema: occasional Dizzy/lightheaded: no   CHRONIC KIDNEY DISEASE Saw Dr. Holley Raring on 01/24/21 and Dr. Ralph Dowdy at [facility] on 01/15/21 -- they started Lasix 20 MG daily and K+.  Is receiving Feraheme at [facility] for anemia with CKD.  Recent 01/24/21 labs showed CRT 1.60, eGFR 33, PTH 146, H/H 8.8/26.7.   CKD status: stable Medications renally dose: yes Previous renal evaluation: yes Pneumovax:  Up to Date Influenza Vaccine:  Up to Date   Relevant past medical, surgical, family and social history reviewed and updated as indicated. Interim medical history since our last visit reviewed. Allergies and medications reviewed and updated.  Review of Systems  Constitutional:  Negative for activity change, appetite change, diaphoresis, fatigue and fever.  Respiratory:  Negative for cough, chest tightness, shortness of breath and wheezing.   Cardiovascular:  Negative for chest pain, palpitations and leg swelling.  Gastrointestinal: Negative.   Endocrine: Negative for cold  intolerance, heat intolerance, polydipsia, polyphagia and polyuria.  Neurological: Negative.   Psychiatric/Behavioral: Negative.     Per HPI unless specifically indicated above     Objective:    BP 115/68   Pulse 68   Temp 98.6 F (37 C) (Oral)   Wt 153 lb 12.8 oz (69.8 kg)   LMP  (LMP Unknown)   SpO2 98%   BMI 28.13 kg/m   Wt Readings from Last 3 Encounters:  03/01/21 153 lb 12.8 oz (69.8 kg)  12/26/20 148 lb 3.2 oz (67.2 kg)  11/29/20 152 lb (68.9 kg)    Physical Exam Vitals and nursing note reviewed.  Constitutional:      General: She is awake. She is not in acute distress.    Appearance: She is well-developed, well-groomed and overweight. She is not ill-appearing.   HENT:     Head: Normocephalic.     Right Ear: Hearing normal.     Left Ear: Hearing normal.  Eyes:     General: Lids are normal.        Right eye: No discharge.        Left eye: No discharge.     Conjunctiva/sclera: Conjunctivae normal.     Pupils: Pupils are equal, round, and reactive to light.  Neck:     Thyroid: No thyromegaly.     Vascular: No carotid bruit.  Cardiovascular:     Rate and Rhythm: Normal rate and regular rhythm.     Heart sounds: Murmur heard.  Systolic murmur is present with a grade of 2/6.    No gallop.  Pulmonary:     Effort: Pulmonary effort is normal. No accessory muscle usage or respiratory distress.     Breath sounds: Normal breath sounds.  Abdominal:     General: Bowel sounds are normal.     Palpations: Abdomen is soft. There is no hepatomegaly or splenomegaly.  Musculoskeletal:     Cervical back: Normal range of motion and neck supple.     Right lower leg: No edema.     Left lower leg: No edema.  Lymphadenopathy:     Cervical: No cervical adenopathy.  Skin:    General: Skin is warm and dry.  Neurological:     Mental Status: She is alert and oriented to person, place, and time.  Psychiatric:        Attention and Perception: Attention normal.        Mood and Affect: Mood normal.        Speech: Speech normal.        Behavior: Behavior normal. Behavior is cooperative.        Thought Content: Thought content normal.    Results for orders placed or performed in visit on 12/26/20  Novel Coronavirus, NAA ([facility])   Specimen: Nasopharyngeal(NP) swabs in vial transport medium  Result Value Ref Range   SARS-CoV-2, NAA Not Detected Not Detected  Microscopic Examination   Urine  Result Value Ref Range   WBC, UA None seen 0 - 5 /hpf   RBC 0-2 0 - 2 /hpf   Epithelial Cells (non renal) 0-10 0 - 10 /hpf   Crystals Present (A) N/A   Crystal Type Amorphous Sediment N/A   Mucus, UA Present (A) Not Estab.   Bacteria, UA Few (A) None seen/Few  Urine  Culture   Specimen: Urine   UR  Result Value Ref Range   Urine Culture, Routine Final report (A)    Organism ID, Bacteria Klebsiella pneumoniae (A)  Antimicrobial Susceptibility Comment   SARS-COV-2, NAA 2 DAY TAT  Result Value Ref Range   SARS-CoV-2, NAA 2 DAY TAT Performed   CBC with Differential/Platelet  Result Value Ref Range   WBC 10.8 3.4 - 10.8 x10E3/uL   RBC 4.01 3.77 - 5.28 x10E6/uL   Hemoglobin 9.6 (L) 11.1 - 15.9 g/dL   Hematocrit 30.7 (L) 34.0 - 46.6 %   MCV 77 (L) 79 - 97 fL   MCH 23.9 (L) 26.6 - 33.0 pg   MCHC 31.3 (L) 31.5 - 35.7 g/dL   RDW 15.3 11.7 - 15.4 %   Platelets 360 150 - 450 x10E3/uL   Neutrophils 70 Not Estab. %   Lymphs 21 Not Estab. %   Monocytes 7 Not Estab. %   Eos 1 Not Estab. %   Basos 0 Not Estab. %   Neutrophils Absolute 7.6 (H) 1.4 - 7.0 x10E3/uL   Lymphocytes Absolute 2.2 0.7 - 3.1 x10E3/uL   Monocytes Absolute 0.7 0.1 - 0.9 x10E3/uL   EOS (ABSOLUTE) 0.1 0.0 - 0.4 x10E3/uL   Basophils Absolute 0.0 0.0 - 0.2 x10E3/uL   Immature Granulocytes 1 Not Estab. %   Immature Grans (Abs) 0.1 0.0 - 0.1 x10E3/uL  Comprehensive metabolic panel  Result Value Ref Range   Glucose 102 (H) 65 - 99 mg/dL   BUN 51 (H) 8 - 27 mg/dL   Creatinine, Ser 6.08 (H) 0.57 - 1.00 mg/dL   eGFR 7 (L) >59 mL/min/1.73   BUN/Creatinine Ratio 8 (L) 12 - 28   Sodium 144 134 - 144 mmol/L   Potassium 3.7 3.5 - 5.2 mmol/L   Chloride 103 96 - 106 mmol/L   CO2 16 (L) 20 - 29 mmol/L   Calcium 9.4 8.7 - 10.3 mg/dL   Total Protein 7.5 6.0 - 8.5 g/dL   Albumin 4.1 3.7 - 4.7 g/dL   Globulin, Total 3.4 1.5 - 4.5 g/dL   Albumin/Globulin Ratio 1.2 1.2 - 2.2   Bilirubin Total 0.2 0.0 - 1.2 mg/dL   Alkaline Phosphatase 85 44 - 121 IU/L   AST 152 (H) 0 - 40 IU/L   ALT 95 (H) 0 - 32 IU/L  TSH  Result Value Ref Range   TSH 0.340 (L) 0.450 - 4.500 uIU/mL  CK (Creatine Kinase)  Result Value Ref Range   Total CK 6,671 (HH) 32 - 182 U/L  C-reactive protein  Result Value Ref  Range   CRP 43 (H) 0 - 10 mg/L  Sed Rate (ESR)  Result Value Ref Range   Sed Rate 68 (H) 0 - 40 mm/hr  ANA w/Reflex if Positive  Result Value Ref Range   Anti Nuclear Antibody (ANA) Negative Negative  Urinalysis, Routine w reflex microscopic  Result Value Ref Range   Specific Gravity, UA 1.015 1.005 - 1.030   pH, UA 5.0 5.0 - 7.5   Color, UA Yellow Yellow   Appearance Ur Cloudy (A) Clear   Leukocytes,UA Negative Negative   Protein,UA 2+ (A) Negative/Trace   Glucose, UA Negative Negative   Ketones, UA Trace (A) Negative   RBC, UA 3+ (A) Negative   Bilirubin, UA Negative Negative   Urobilinogen, Ur 0.2 0.2 - 1.0 mg/dL   Nitrite, UA Negative Negative   Microscopic Examination See below:       Assessment & Plan:   Problem List Items Addressed This Visit       Cardiovascular and Mediastinum   Hypertension associated with diabetes (HCC)    Chronic, ongoing  with BP at goal today and at goal at home. Recommend she monitor BP at least a few mornings a week at home and document.  DASH diet at home.  Continue current medication regimen and adjust as needed.  Labs next visit: BMP, Mag, lipid.  Return in 3 months.       Relevant Medications   amLODipine (NORVASC) 10 MG tablet   carvedilol (COREG) 12.5 MG tablet   furosemide (LASIX) 20 MG tablet   Other Relevant Orders   Bayer DCA Hb A1c Waived   Magnesium   Aortic atherosclerosis (HCC)    Chronic, ongoing noted on imaging CT 01/26/17.  Continue daily ASA for prevention and monitor closely.  No statin due to side effects with these.      Relevant Medications   amLODipine (NORVASC) 10 MG tablet   carvedilol (COREG) 12.5 MG tablet   furosemide (LASIX) 20 MG tablet   LVH (left ventricular hypertrophy)    Moderate on echo in 2019 -- will repeat echo and recommend she return to cardiology.      Relevant Medications   amLODipine (NORVASC) 10 MG tablet   carvedilol (COREG) 12.5 MG tablet   furosemide (LASIX) 20 MG tablet   Other  Relevant Orders   ECHOCARDIOGRAM COMPLETE     Endocrine   Hyperlipidemia associated with type 2 diabetes mellitus (HCC)    Chronic, ongoing, unable to take statin due to rhabdo with these and subsequent AKI with ongoing CKD now.  Check Lipid panel today, could consider Repatha in upcoming months if cholesterol elevations.  Discussed with patient. Return in 3 months.      Relevant Orders   Bayer DCA Hb A1c Waived   Lipid Panel w/o Chol/HDL Ratio   Diabetic neuropathy (HCC) - Primary    Chronic, ongoing with A1c 5.4%.  Continue diet control and weight management at this time + Gabapentin for neuropathy + continuing to monitor blood sugar daily.  BMP today to ensure no further renal dosing needed and will adjust medication doses as needed. Return in 3 months.      Relevant Orders   Bayer DCA Hb A1c Waived   CKD stage 3 due to type 2 diabetes mellitus (HCC)    Chronic, ongoing.  Continue current medication regimen and adjust as needed.  Continue collaboration with nephrology locally and at [facility], recent notes and labs reviewed.  Return in 3 months.      Relevant Orders   Basic metabolic panel     Other   Anemia in chronic kidney disease (CKD)    Chronic, ongoing.  Continue current medication regimen and collaboration with hematology and nephrology, recent notes and labs reviewed.      Allergy to statin medication    With rhabdo and AKI -- consider Repatha if medication needed in future.      Heart murmur    Repeat echo and recommend she return to cardiology for follow-up.      Relevant Orders   ECHOCARDIOGRAM COMPLETE     Follow up plan: Return in about 3 months (around 05/31/2021) for T2DM, HTN/HLD, CKD, ANEMIA.

## 2021-03-01 NOTE — Assessment & Plan Note (Addendum)
Chronic, ongoing noted on imaging CT 01/26/17.  Continue daily ASA for prevention and monitor closely.  No statin due to side effects with these.

## 2021-03-01 NOTE — Telephone Encounter (Signed)
Patient states she has not taken B-12 in at least 2 months as patient was taken off of it when she was admitted to [facility]. Please advise?

## 2021-03-02 LAB — MAGNESIUM: Magnesium: 1.8 mg/dL (ref 1.6–2.3)

## 2021-03-02 LAB — BASIC METABOLIC PANEL
BUN/Creatinine Ratio: 18 (ref 12–28)
BUN: 18 mg/dL (ref 8–27)
CO2: 25 mmol/L (ref 20–29)
Calcium: 9.7 mg/dL (ref 8.7–10.3)
Chloride: 103 mmol/L (ref 96–106)
Creatinine, Ser: 1.02 mg/dL — ABNORMAL HIGH (ref 0.57–1.00)
Glucose: 88 mg/dL (ref 65–99)
Potassium: 4.2 mmol/L (ref 3.5–5.2)
Sodium: 143 mmol/L (ref 134–144)
eGFR: 56 mL/min/{1.73_m2} — ABNORMAL LOW (ref >59)

## 2021-03-02 LAB — LIPID PANEL W/O CHOL/HDL RATIO
Cholesterol, Total: 281 mg/dL — ABNORMAL HIGH (ref 100–199)
HDL: 66 mg/dL (ref >39)
LDL Chol Calc (NIH): 196 mg/dL — ABNORMAL HIGH (ref 0–99)
Triglycerides: 108 mg/dL (ref 0–149)
VLDL Cholesterol Cal: 19 mg/dL (ref 5–40)

## 2021-03-02 NOTE — Progress Notes (Signed)
Contacted via [place] [facility] [place], your labs have returned.  Cholesterol levels as expected are elevated without statin on board -- next visit we will discuss having a trial of the injectable I discussed -- Repatha, this I use often in people who can not take statins.  Magnesium level is normal.  Your kidney function, creatinine and eGFR, is looking much improved this check.  Still kidney disease present, but improved.  Great news.  Any questions? Keep being awesome!!  Thank you for allowing me to participate in your care.  I appreciate you. Kindest regards, Denman Pichardo

## 2021-03-20 ENCOUNTER — Ambulatory Visit: Payer: Medicare Other

## 2021-03-22 ENCOUNTER — Telehealth: Payer: Self-pay | Admitting: Nurse Practitioner

## 2021-03-22 NOTE — Telephone Encounter (Signed)
N/A-unable to leave a voicemail to inform patient that we have changed her AWV appt from [place] 03/26/21 to Telephone Visit 03/29/21 due to change in [facility] schedule. If patient calls back please inform her that her AWV appt is by telephone Friday 03/29/21 @ 2:30.

## 2021-03-26 DIAGNOSIS — N179 Acute kidney failure, unspecified: Secondary | ICD-10-CM | POA: Diagnosis not present

## 2021-03-26 DIAGNOSIS — N2581 Secondary hyperparathyroidism of renal origin: Secondary | ICD-10-CM | POA: Diagnosis not present

## 2021-03-26 DIAGNOSIS — D631 Anemia in chronic kidney disease: Secondary | ICD-10-CM | POA: Diagnosis not present

## 2021-03-26 DIAGNOSIS — I10 Essential (primary) hypertension: Secondary | ICD-10-CM | POA: Diagnosis not present

## 2021-03-27 ENCOUNTER — Ambulatory Visit: Payer: Medicare Other

## 2021-03-29 ENCOUNTER — Ambulatory Visit (INDEPENDENT_AMBULATORY_CARE_PROVIDER_SITE_OTHER): Payer: Medicare Other

## 2021-03-29 VITALS — Ht 62.0 in | Wt 152.0 lb

## 2021-03-29 DIAGNOSIS — Z0000 Encounter for general adult medical examination without abnormal findings: Secondary | ICD-10-CM

## 2021-03-29 NOTE — Patient Instructions (Signed)
Tina Mcdonald , Thank you for taking time to come for your Medicare Wellness Visit. I appreciate your ongoing commitment to your health goals. Please review the following plan we discussed and let me know if I can assist you in the future.   Screening recommendations/referrals: Colonoscopy: completed 01/17/2019 Mammogram: end of 2021 or beginning of 2022 per patient Bone Density: completed 02/20/2016 Recommended yearly ophthalmology/optometry visit for glaucoma screening and checkup Recommended yearly dental visit for hygiene and checkup  Vaccinations: Influenza vaccine: decline Pneumococcal vaccine: completed 10/12/2013 Tdap vaccine: completed 08/24/2020, due 08/25/2030 Shingles vaccine: discussed   Covid-19: 04/03/2020, 08/22/2019, 08/01/2019  Advanced directives: Please bring a copy of your POA (Power of Attorney) and/or Living Will to your next appointment.   Conditions/risks identified: none  Next appointment: Follow up in one year for your annual wellness visit    Preventive Care 65 Years and Older, Female Preventive care refers to lifestyle choices and visits with your health care provider that can promote health and wellness. What does preventive care include? A yearly physical exam. This is also called an annual well check. Dental exams once or twice a year. Routine eye exams. Ask your health care provider how often you should have your eyes checked. Personal lifestyle choices, including: Daily care of your teeth and gums. Regular physical activity. Eating a healthy diet. Avoiding tobacco and drug use. Limiting alcohol use. Practicing safe sex. Taking low-dose aspirin every day. Taking vitamin and mineral supplements as recommended by your health care provider. What happens during an annual well check? The services and screenings done by your health care provider during your annual well check will depend on your age, overall health, lifestyle risk factors, and family history of  disease. Counseling  Your health care provider may ask you questions about your: Alcohol use. Tobacco use. Drug use. Emotional well-being. Home and relationship well-being. Sexual activity. Eating habits. History of falls. Memory and ability to understand (cognition). Work and work Statistician. Reproductive health. Screening  You may have the following tests or measurements: Height, weight, and BMI. Blood pressure. Lipid and cholesterol levels. These may be checked every 5 years, or more frequently if you are over 22 years old. Skin check. Lung cancer screening. You may have this screening every year starting at age 72 if you have a 30-pack-year history of smoking and currently smoke or have quit within the past 15 years. Fecal occult blood test (FOBT) of the stool. You may have this test every year starting at age 87. Flexible sigmoidoscopy or colonoscopy. You may have a sigmoidoscopy every 5 years or a colonoscopy every 10 years starting at age 25. Hepatitis C blood test. Hepatitis B blood test. Sexually transmitted disease (STD) testing. Diabetes screening. This is done by checking your blood sugar (glucose) after you have not eaten for a while (fasting). You may have this done every 1-3 years. Bone density scan. This is done to screen for osteoporosis. You may have this done starting at age 56. Mammogram. This may be done every 1-2 years. Talk to your health care provider about how often you should have regular mammograms. Talk with your health care provider about your test results, treatment options, and if necessary, the need for more tests. Vaccines  Your health care provider may recommend certain vaccines, such as: Influenza vaccine. This is recommended every year. Tetanus, diphtheria, and acellular pertussis (Tdap, Td) vaccine. You may need a Td booster every 10 years. Zoster vaccine. You may need this after age 42. Pneumococcal  13-valent conjugate (PCV13) vaccine. One  dose is recommended after age 76. Pneumococcal polysaccharide (PPSV23) vaccine. One dose is recommended after age 58. Talk to your health care provider about which screenings and vaccines you need and how often you need them. This information is not intended to replace advice given to you by your health care provider. Make sure you discuss any questions you have with your health care provider. Document Released: 07/06/2015 Document Revised: 02/27/2016 Document Reviewed: 04/10/2015 Elsevier Interactive Patient Education  2017 [employer] [place] Prevention in the Home Falls can cause injuries. They can happen to people of all ages. There are many things you can do to make your home safe and to help prevent falls. What can I do on the outside of my home? Regularly fix the edges of walkways and driveways and fix any cracks. Remove anything that might make you trip as you walk through a door, such as a raised step or threshold. Trim any bushes or trees on the path to your home. Use bright outdoor lighting. Clear any walking paths of anything that might make someone trip, such as rocks or tools. Regularly check to see if handrails are loose or broken. Make sure that both sides of any steps have handrails. Any raised decks and porches should have guardrails on the edges. Have any leaves, snow, or ice cleared regularly. Use sand or salt on walking paths during winter. Clean up any spills in your garage right away. This includes oil or grease spills. What can I do in the bathroom? Use night lights. Install grab bars by the toilet and in the tub and shower. Do not use towel bars as grab bars. Use non-skid mats or decals in the tub or shower. If you need to sit down in the shower, use a plastic, non-slip stool. Keep the floor dry. Clean up any water that spills on the floor as soon as it happens. Remove soap buildup in the tub or shower regularly. Attach bath mats securely with double-sided  non-slip rug tape. Do not have throw rugs and other things on the floor that can make you trip. What can I do in the bedroom? Use night lights. Make sure that you have a light by your bed that is easy to reach. Do not use any sheets or blankets that are too big for your bed. They should not hang down onto the floor. Have a firm chair that has side arms. You can use this for support while you get dressed. Do not have throw rugs and other things on the floor that can make you trip. What can I do in the kitchen? Clean up any spills right away. Avoid walking on wet floors. Keep items that you use a lot in easy-to-reach places. If you need to reach something above you, use a strong step stool that has a grab bar. Keep electrical cords out of the way. Do not use floor polish or wax that makes floors slippery. If you must use wax, use non-skid floor wax. Do not have throw rugs and other things on the floor that can make you trip. What can I do with my stairs? Do not leave any items on the stairs. Make sure that there are handrails on both sides of the stairs and use them. Fix handrails that are broken or loose. Make sure that handrails are as long as the stairways. Check any carpeting to make sure that it is firmly attached to the stairs. Fix any carpet  that is loose or worn. Avoid having throw rugs at the top or bottom of the stairs. If you do have throw rugs, attach them to the floor with carpet tape. Make sure that you have a light switch at the top of the stairs and the bottom of the stairs. If you do not have them, ask someone to add them for you. What else can I do to help prevent falls? Wear shoes that: Do not have high heels. Have rubber bottoms. Are comfortable and fit you well. Are closed at the toe. Do not wear sandals. If you use a stepladder: Make sure that it is fully opened. Do not climb a closed stepladder. Make sure that both sides of the stepladder are locked into place. Ask  someone to hold it for you, if possible. Clearly mark and make sure that you can see: Any grab bars or handrails. First and last steps. Where the edge of each step is. Use tools that help you move around (mobility aids) if they are needed. These include: Canes. Walkers. Scooters. Crutches. Turn on the lights when you go into a dark area. Replace any light bulbs as soon as they burn out. Set up your furniture so you have a clear path. Avoid moving your furniture around. If any of your floors are uneven, fix them. If there are any pets around you, be aware of where they are. Review your medicines with your doctor. Some medicines can make you feel dizzy. This can increase your chance of falling. Ask your doctor what other things that you can do to help prevent falls. This information is not intended to replace advice given to you by your health care provider. Make sure you discuss any questions you have with your health care provider. Document Released: 04/05/2009 Document Revised: 11/15/2015 Document Reviewed: 07/14/2014 Elsevier Interactive Patient Education  2017 [employer].

## 2021-03-29 NOTE — Addendum Note (Signed)
Addended by: Kellie Simmering on: 03/29/2021 03:38 PM   Modules accepted: Orders

## 2021-03-29 NOTE — Progress Notes (Signed)
I connected with Tina Mcdonald today by telephone and verified that I am speaking with the correct person using two identifiers. Location patient: home Location provider: work Persons participating in the virtual visit: Tina Mcdonald, Tina Durand LPN.   I discussed the limitations, risks, security and privacy concerns of performing an evaluation and management service by telephone and the availability of in person appointments. I also discussed with the patient that there may be a patient responsible charge related to this service. The patient expressed understanding and verbally consented to this telephonic visit.    Interactive audio and video telecommunications were attempted between this provider and patient, however failed, due to patient having technical difficulties OR patient did not have access to video capability.  We continued and completed visit with audio only.     Vital signs may be patient reported or missing.  Subjective:   Tina Mcdonald is a 80 y.o. female who presents for Medicare Annual (Subsequent) preventive examination.  Review of Systems     Cardiac Risk Factors include: advanced age (>12mn, >>66women);diabetes mellitus;dyslipidemia;hypertension;sedentary lifestyle     Objective:    Today's Vitals   03/29/21 1426  Weight: 152 lb (68.9 kg)  Height: _0  (1.575 m)   Body mass index is 27.8 kg/m.  Advanced Directives 03/29/2021 10/23/2020 06/08/2020 03/26/2020 12/12/2019 09/24/2019 04/19/2019  Does Patient Have a Medical Advance Directive? Yes No Yes Yes Yes Yes No  Type of AParamed[place]of [place] will - Living will;Healthcare Power of [place]anchette EstatesLiving will Living will;Healthcare Power of [place] will -  Does patient want to make changes to medical advance directive? - - - - - - -  Copy of [place] Chart? No - copy requested - No - copy  requested - - - -  Would patient like information on creating a medical advance directive? - No - Patient declined - - - - -    Current Medications (verified) Outpatient Encounter Medications as of 03/29/2021  Medication Sig   acetaminophen (TYLENOL) 325 MG tablet Take 650 mg by mouth every 6 (six) hours as needed.   amLODipine (NORVASC) 10 MG tablet Take 10 mg by mouth every morning.   carvedilol (COREG) 12.5 MG tablet Take 12.5 mg by mouth 2 (two) times daily with a meal.   diclofenac sodium (VOLTAREN) 1 % GEL Apply 2 g topically 2 (two) times daily as needed.   Ferrous Sulfate (SLOW FE PO) Take 1 tablet by mouth daily.   furosemide (LASIX) 20 MG tablet Take by mouth.   gabapentin (NEURONTIN) 100 MG capsule Take 2 capsules (200 mg total) by mouth at bedtime.   glucose blood test strip Check daily. ICD 10 E11.9   vitamin C (ASCORBIC ACID) 250 MG tablet Take 250 mg by mouth every other day.   blood glucose meter kit and supplies KIT Dispense based on patient and insurance preference. Use up to four times daily as directed. (FOR ICD-9 250.00, 250.01).   irbesartan (AVAPRO) 300 MG tablet Take 1 tablet (300 mg total) by mouth daily. New blood pressure medicine. (Patient not taking: Reported on 03/29/2021)   Facility-Administered Encounter Medications as of 03/29/2021  Medication   cyanocobalamin ((VITAMIN B-12)) injection 1,000 mcg    Allergies (verified) Statins, Contrast media [iodinated diagnostic agents], Penicillins, Sulfa antibiotics, Aloe, Latex, Neosporin [neomycin-bacitracin zn-polymyx], and Other   History: Past Medical History:  Diagnosis Date   Anemia    Arthritis  Chronic kidney disease    Colon polyps    Diabetes mellitus without complication (HCC)    Diabetic neuropathy (HCC)    Dyspnea    GERD (gastroesophageal reflux disease)    Hyperlipemia    Hypertension    Osteoarthritis    Radiculopathy    weakness in right leg   Sleep apnea    does not have her cpap  anymore   Stress incontinence    Urticaria    Vitamin B 12 deficiency    Wears glasses    Past Surgical History:  Procedure Laterality Date   ABDOMINAL HYSTERECTOMY     APPENDECTOMY     BREAST BIOPSY Right    right-neg   BREAST BIOPSY Left    us/bx/clip-neg   CARPAL TUNNEL RELEASE     right   COLONOSCOPY     COLONOSCOPY WITH PROPOFOL N/A 12/10/2015   Procedure: COLONOSCOPY WITH PROPOFOL;  Surgeon: Lucilla Lame, MD;  Location: [place];  Service: Endoscopy;  Laterality: N/A;  Diabetic - oral meds Sleep Apnea LATEX allergy   COLONOSCOPY WITH PROPOFOL N/A 01/17/2019   Procedure: COLONOSCOPY WITH PROPOFOL;  Surgeon: Virgel Manifold, MD;  Location: [place];  Service: Endoscopy;  Laterality: N/A;   CYSTOSCOPY     DILATION AND CURETTAGE OF UTERUS     ESOPHAGOGASTRODUODENOSCOPY (EGD) WITH PROPOFOL N/A 01/17/2019   Procedure: ESOPHAGOGASTRODUODENOSCOPY (EGD) WITH PROPOFOL;  Surgeon: Virgel Manifold, MD;  Location: [place];  Service: Endoscopy;  Laterality: N/A;   EYE SURGERY Bilateral    lens implant   GIVENS CAPSULE STUDY N/A 05/04/2019   Procedure: GIVENS CAPSULE STUDY;  Surgeon: Virgel Manifold, MD;  Location: [facility];  Service: Endoscopy;  Laterality: N/A;   LUMBAR FUSION  2009   POLYPECTOMY  12/10/2015   Procedure: POLYPECTOMY;  Surgeon: Lucilla Lame, MD;  Location: [place];  Service: Endoscopy;;   POLYPECTOMY  01/17/2019   Procedure: POLYPECTOMY;  Surgeon: Virgel Manifold, MD;  Location: [place];  Service: Endoscopy;;   RESECTION DISTAL CLAVICAL Right 11/25/2013   Procedure: RIGHT SHOULDER OPEN RESECTION DISTAL CLAVICAL EXCISION SOFT TISSUE TUMOR SHOULDER DEEP SUBFASCIAL INTRAMUSCULAR/DEBRIDEMENT/ROTATOR CUFF REPAIR/BICEPS TENODESIS/MANIPULATION;  Surgeon: Renette Butters, MD;  Location: [place];  Service: Orthopedics;  Laterality: Right;   SHOULDER ARTHROSCOPY WITH ROTATOR CUFF REPAIR  AND SUBACROMIAL DECOMPRESSION Right 11/25/2013   Procedure: SHOULDER ARTHROSCOPY WITH DEBRIDEMENT ROTATOR CUFF REPAIR  A;  Surgeon: Renette Butters, MD;  Location: [place];  Service: Orthopedics;  Laterality: Right;   Family History  Problem Relation Age of Onset   Heart disease Mother    Diabetes Mother    Hypertension Mother    Heart disease Father    Hypertension Father    Diabetes Sister    Hypertension Sister    Heart disease Sister    Kidney disease Sister    Diabetes Sister    Hypertension Sister    Breast cancer Cousin 48   Non-Hodgkin's lymphoma Maternal Uncle    Social History   Socioeconomic History   Marital status: Single    Spouse name: Not on file   Number of children: Not on file   Years of education: Not on file   Highest education level: Some college, no degree  Occupational History   Occupation: retired  Tobacco Use   Smoking status: Former    Packs/day: 1.00    Years: 15.00    Pack years: 15.00    Types:  Cigarettes    Quit date: 11/22/1997    Years since quitting: 23.3   Smokeless tobacco: Never  Vaping Use   Vaping Use: Never used  Substance and Sexual Activity   Alcohol use: No   Drug use: No   Sexual activity: Not Currently  Other Topics Concern   Not on file  Social History Narrative   Not on file   Social Determinants of Health   Financial Resource Strain: Medium Risk   Difficulty of Paying Living Expenses: Somewhat hard  Food Insecurity: No Food Insecurity   Worried About Charity fundraiser in the Last Year: Never true   Ran Out of Food in the Last Year: Never true  Transportation Needs: No Transportation Needs   Lack of Transportation (Medical): No   Lack of Transportation (Non-Medical): No  Physical Activity: Inactive   Days of Exercise per Week: 0 days   Minutes of Exercise per Session: 0 min  Stress: No Stress Concern Present   Feeling of Stress : Not at all  Social Connections: Not on file    Tobacco  Counseling Counseling given: Not Answered   Clinical Intake:  Pre-visit preparation completed: Yes  Pain : No/denies pain     Nutritional Status: BMI 25 -29 Overweight Nutritional Risks: None Diabetes: Yes  How often do you need to have someone help you when you read instructions, pamphlets, or other written materials from your doctor or pharmacy?: 1 - Never What is the last grade level you completed in school?: 12th grade  Diabetic? Yes Nutrition Risk Assessment:  Has the patient had any N/V/D within the last 2 months?  No  Does the patient have any non-healing wounds?  No  Has the patient had any unintentional weight loss or weight gain?  Yes   Diabetes:  Is the patient diabetic?  Yes  If diabetic, was a CBG obtained today?  No  Did the patient bring in their glucometer from home?  No  How often do you monitor your CBG's? Does not.   Financial Strains and Diabetes Management:  Are you having any financial strains with the device, your supplies or your medication? No .  Does the patient want to be seen by Chronic Care Management for management of their diabetes?  No  Would the patient like to be referred to a Nutritionist or for Diabetic Management?  No   Diabetic Exams:  Diabetic Eye Exam: Overdue for diabetic eye exam. Pt has been advised about the importance in completing this exam. Patient advised to call and schedule an eye exam. Diabetic Foot Exam: Completed 04/13/2020   Interpreter Needed?: No  Information entered by :: NAllen LPN   Activities of Daily Living In your present state of health, do you have any difficulty performing the following activities: 03/29/2021 10/23/2020  Hearing? N N  Vision? N N  Comment - Implants bilateral 2016  Difficulty concentrating or making decisions? N N  Walking or climbing stairs? Y N  Comment SOB -  Dressing or bathing? N N  Doing errands, shopping? N N  Preparing Food and eating ? N -  Using the Toilet? N -  In  the past six months, have you accidently leaked urine? Y -  Do you have problems with loss of bowel control? N -  Managing your Medications? N -  Managing your Finances? N -  Housekeeping or managing your Housekeeping? N -  Some recent data might be hidden    Patient Care Team:  [name], NP as PCP - General (Nurse Practitioner) Yolonda Kida, MD (Internal Medicine) Vanita Ingles, RN as Case Manager (General Practice)  Indicate any recent Medical Services you may have received from other than [facility] providers in the past year (date may be approximate).     Assessment:   This is a routine wellness examination for [place].  Hearing/Vision screen Vision Screening - Comments:: No regular eye exams,   Dietary issues and exercise activities discussed: Current Exercise Habits: The patient does not participate in regular exercise at present   Goals Addressed             This Visit's Progress    Patient Stated       03/29/2021, stay up on kidneys       Depression Screen PHQ 2/9 Scores 03/29/2021 12/21/2020 11/29/2020 11/01/2020 08/24/2020 03/26/2020 01/25/2020  PHQ - 2 Score 0 0 0 0 0 0 0  PHQ- 9 Score - - - - 0 - -    Fall Risk Fall Risk  03/29/2021 12/21/2020 11/29/2020 11/01/2020 03/26/2020  Falls in the past year? 1 0 0 0 0  Comment tripped over something - - - -  Number falls in past yr: 0 0 0 0 -  Injury with Fall? 0 0 0 0 -  Risk for fall due to : Medication side effect - No Fall Risks - Medication side effect  Follow up Falls evaluation completed;Education provided;Falls prevention discussed - Falls evaluation completed Falls evaluation completed Falls evaluation completed;Education provided;Falls prevention discussed    FALL RISK PREVENTION PERTAINING TO THE HOME:  Any stairs in or around the home? Yes  If so, are there any without handrails? No  Home free of loose throw rugs in walkways, pet beds, electrical cords, etc? Yes  Adequate lighting in your home to  reduce risk of falls? Yes   ASSISTIVE DEVICES UTILIZED TO PREVENT FALLS:  Life alert? No  Use of a cane, walker or w/c? No  Grab bars in the bathroom? No  Shower chair or bench in shower? No  Elevated toilet seat or a handicapped toilet? Yes   TIMED UP AND GO:  Was the test performed? No .      Cognitive Function:     6CIT Screen 03/29/2021 03/26/2020 03/04/2018 02/05/2017  What Year? 0 points 0 points 0 points 0 points  What month? 0 points 0 points 0 points 0 points  What time? 0 points 0 points 0 points 0 points  Count back from 20 0 points 0 points 0 points 0 points  Months in reverse 0 points 0 points 0 points 0 points  Repeat phrase 2 points 0 points 0 points 0 points  Total Score 2 0 0 0    Immunizations Immunization History  Administered Date(s) Administered   PFIZER(Purple Top)SARS-COV-2 Vaccination 08/01/2019, 08/22/2019, 04/03/2020   Pneumococcal Conjugate-13 10/12/2013   Pneumococcal Polysaccharide-23 05/28/2010   Td 10/02/2006, 08/24/2020    TDAP status: Up to date  Flu Vaccine status: Declined, Education has been provided regarding the importance of this vaccine but patient still declined. Advised may receive this vaccine at local pharmacy or Health Dept. Aware to provide a copy of the vaccination record if obtained from local pharmacy or Health Dept. Verbalized acceptance and understanding.  Pneumococcal vaccine status: Up to date  Covid-19 vaccine status: Completed vaccines  Qualifies for Shingles Vaccine? Yes   Zostavax completed No   Shingrix Completed?: No.    Education has been provided regarding  the importance of this vaccine. Patient has been advised to call insurance company to determine out of pocket expense if they have not yet received this vaccine. Advised may also receive vaccine at local pharmacy or Health Dept. Verbalized acceptance and understanding.  Screening Tests Health Maintenance  Topic Date Due   Zoster Vaccines- Shingrix (1 of  2) Never done   OPHTHALMOLOGY EXAM  04/13/2020   COVID-19 Vaccine (4 - Booster for Pfizer series) 08/04/2020   INFLUENZA VACCINE  09/20/2021 (Originally 01/21/2021)   FOOT EXAM  04/13/2021   HEMOGLOBIN A1C  08/29/2021   TETANUS/TDAP  08/25/2030   DEXA SCAN  Completed   Hepatitis C Screening  Completed   HPV VACCINES  Aged Out    Health Maintenance  Health Maintenance Due  Topic Date Due   Zoster Vaccines- Shingrix (1 of 2) Never done   OPHTHALMOLOGY EXAM  04/13/2020   COVID-19 Vaccine (4 - Booster for Pfizer series) 08/04/2020    Colorectal cancer screening: Type of screening: Colonoscopy. Completed 01/17/2019. Repeat every 5 years  Mammogram status: Completed 2022. Repeat every year  Bone Density status: Completed 02/20/2016.   Lung Cancer Screening: (Low Dose CT Chest recommended if Age 30-80 years, 30 pack-year currently smoking OR have quit w/in 15years.) does not qualify.   Lung Cancer Screening Referral: no  Additional Screening:  Hepatitis C Screening: does qualify; Completed 12/02/2019  Vision Screening: Recommended annual ophthalmology exams for early detection of glaucoma and other disorders of the eye. Is the patient up to date with their annual eye exam?  No  Who is the provider or what is the name of the office in which the patient attends annual eye exams? none If pt is not established with a provider, would they like to be referred to a provider to establish care? No .   Dental Screening: Recommended annual dental exams for proper oral hygiene  Community Resource Referral / Chronic Care Management: CRR required this visit?  No   CCM required this visit?  No      Plan:     I have personally reviewed and noted the following in the patient's chart:   Medical and social history Use of alcohol, tobacco or illicit drugs  Current medications and supplements including opioid prescriptions.  Functional ability and status Nutritional status Physical  activity Advanced directives List of other physicians Hospitalizations, surgeries, and ER visits in previous 12 months Vitals Screenings to include cognitive, depression, and falls Referrals and appointments  In addition, I have reviewed and discussed with patient certain preventive protocols, quality metrics, and best practice recommendations. A written personalized care plan for preventive services as well as general preventive health recommendations were provided to patient.     Kellie Simmering, LPN   [id number]/02/2762   Nurse Notes:

## 2021-04-02 DIAGNOSIS — I1310 Hypertensive heart and chronic kidney disease without heart failure, with stage 1 through stage 4 chronic kidney disease, or unspecified chronic kidney disease: Secondary | ICD-10-CM | POA: Diagnosis not present

## 2021-04-02 DIAGNOSIS — N183 Chronic kidney disease, stage 3 unspecified: Secondary | ICD-10-CM | POA: Diagnosis not present

## 2021-04-02 DIAGNOSIS — D631 Anemia in chronic kidney disease: Secondary | ICD-10-CM | POA: Diagnosis not present

## 2021-04-02 DIAGNOSIS — M6282 Rhabdomyolysis: Secondary | ICD-10-CM | POA: Diagnosis not present

## 2021-04-02 DIAGNOSIS — R0609 Other forms of dyspnea: Secondary | ICD-10-CM | POA: Diagnosis not present

## 2021-04-02 DIAGNOSIS — Z79899 Other long term (current) drug therapy: Secondary | ICD-10-CM | POA: Diagnosis not present

## 2021-04-02 DIAGNOSIS — Z23 Encounter for immunization: Secondary | ICD-10-CM | POA: Diagnosis not present

## 2021-04-02 DIAGNOSIS — E785 Hyperlipidemia, unspecified: Secondary | ICD-10-CM | POA: Diagnosis not present

## 2021-04-02 DIAGNOSIS — D508 Other iron deficiency anemias: Secondary | ICD-10-CM | POA: Diagnosis not present

## 2021-04-02 DIAGNOSIS — R011 Cardiac murmur, unspecified: Secondary | ICD-10-CM | POA: Diagnosis not present

## 2021-04-02 DIAGNOSIS — E1122 Type 2 diabetes mellitus with diabetic chronic kidney disease: Secondary | ICD-10-CM | POA: Diagnosis not present

## 2021-04-02 DIAGNOSIS — N1832 Chronic kidney disease, stage 3b: Secondary | ICD-10-CM | POA: Diagnosis not present

## 2021-04-02 DIAGNOSIS — N179 Acute kidney failure, unspecified: Secondary | ICD-10-CM | POA: Diagnosis not present

## 2021-04-17 ENCOUNTER — Telehealth: Payer: Self-pay

## 2021-04-17 NOTE — Telephone Encounter (Signed)
   Telephone encounter was:  Successful.  04/17/2021 Name: Tina Mcdonald MRN: [id number] DOB: 13-Jun-1941  Tina Mcdonald is a 80 y.o. year old female who is a primary care patient of [name], [name], NP . The community resource team was consulted for assistance with Financial Difficulties related to utilities.  Care guide performed the following interventions: Spoke with patient about Theatre manager,  [place] DSS utility assistance/Energy assistance and Teacher, English as a foreign language. Called agencies to verify that funds are available. Confirmed email address [email or web address]. Emailed information and applications. Letter saved in Epic.  Follow Up Plan:  Care guide will follow up with patient by phone over the next 7-10 days.  Nikaya Nasby, AAS Paralegal, [place]  [address], [place] [address] ??[email or web address]  ?? [phone]   [email or web address]

## 2021-04-23 ENCOUNTER — Telehealth: Payer: Self-pay

## 2021-04-23 NOTE — Telephone Encounter (Signed)
   Telephone encounter was:  Successful.  04/23/2021 Name: Tina Mcdonald MRN: [id number] DOB: 1940/11/09  Tina Mcdonald is a 80 y.o. year old female who is a primary care patient of [name], [name], NP . The community resource team was consulted for assistance with  utilities.  Care guide performed the following interventions: Received confirmation that emailed information was received.  Follow Up Plan:  No further follow up planned at this time. The patient has been provided with needed resources.  Khush Pasion, AAS Paralegal, [place] [facility]  [address], [place] [address] ??[email or web address]  ?? [phone]   [email or web address]

## 2021-05-17 ENCOUNTER — Other Ambulatory Visit: Payer: Self-pay | Admitting: Nurse Practitioner

## 2021-05-19 NOTE — Telephone Encounter (Signed)
Requested Prescriptions  Pending Prescriptions Disp Refills  . gabapentin (NEURONTIN) 100 MG capsule [Pharmacy Med Name: Gabapentin 100 MG Oral Capsule] 180 capsule 3    Sig: TAKE 2 CAPSULES BY MOUTH AT BEDTIME     Neurology: Anticonvulsants - gabapentin Passed - 05/17/2021 11:09 PM      Passed - Valid encounter within last 12 months    Recent Outpatient Visits          2 months ago Other diabetic neurological complication associated with type 2 diabetes mellitus ([place])   [place] [name], Jolene T, NP   4 months ago Fatigue, unspecified type   [employer], [name], NP   4 months ago COVID-19   [employer], Lauren A, NP   5 months ago CKD stage 3 due to type 2 diabetes mellitus ([place])   [place], Jolene T, NP   6 months ago AKI (acute kidney injury) ([place])   [place], Lauren A, NP      Future Appointments            In 1 week [name], [name], NP [place], [facility]   In 30 months  [employer], [facility]

## 2021-05-20 ENCOUNTER — Ambulatory Visit: Payer: Self-pay

## 2021-05-20 DIAGNOSIS — E559 Vitamin D deficiency, unspecified: Secondary | ICD-10-CM | POA: Insufficient documentation

## 2021-05-20 NOTE — Telephone Encounter (Signed)
Pt c/o moderate right leg edema located from the knee down to toes. Pain is moderate to severe and pt stated lower leg looks shiny and tight. Pt stated she was having hot flashes and chills yesterday. Did not take temp.  Pt has been using her Lasix as her nephrologist prescribed.  Pt with cough that produces white foamy phlegm. Pt stated cough started last week. Pt stated she has chronic SOB but has not worsened form baseline.  Pt stated she used Voltaren and then noted it has ibuprofen in it so she dc 'd it.  Pt refused both ED and UC.  Called office but closed for lunch. Pt only wanted to see [facility] NP. Scheduled pt for an appt tomorrow.   Please review this and if appt is appropriate. Advised pt to call both her nephrologist and cardiologist after 1 pm.  Advised pt to call 911 if difficulty breathing, or leg swelling worsens.         Reason for Disposition  [1] Swelling is painful to touch AND [2] fever  Answer Assessment - Initial Assessment Questions 1. ONSET: "When did the swelling start?" (e.g., minutes, hours, days)     Wednesday   2. LOCATION: "What part of the leg is swollen?"  "Are both legs swollen or just one leg?"     Right knee down 3. SEVERITY: "How bad is the swelling?" (e.g., localized; mild, moderate, severe)  - Localized - small area of swelling localized to one leg  - MILD pedal edema - swelling limited to foot and ankle, pitting edema < 1/4 inch (6 mm) deep, rest and elevation eliminate most or all swelling  - MODERATE edema - swelling of lower leg to knee, pitting edema > 1/4 inch (6 mm) deep, rest and elevation only partially reduce swelling  - SEVERE edema - swelling extends above knee, facial or hand swelling present      Moderate- severe 4. REDNESS: "Does the swelling look red or infected?"     Shiny tight 5. PAIN: "Is the swelling painful to touch?" If Yes, ask: "How painful is it?"   (Scale 1-10; mild, moderate or severe)    Knee -moderate to severe 6.  FEVER: "Do you have a fever?" If Yes, ask: "What is it, how was it measured, and when did it start?"      Yesterday did not take it -chills and hot and sweaty 7. CAUSE: "What do you think is causing the leg swelling?"     Kidney worsening or used Voltaren  8. MEDICAL HISTORY: "Do you have a history of heart failure, kidney disease, liver failure, or cancer?"     kidney 9. RECURRENT SYMPTOM: "Have you had leg swelling before?" If Yes, ask: "When was the last time?" "What happened that time?"     Yes used Lasix 10. OTHER SYMPTOMS: "Do you have any other symptoms?" (e.g., chest pain, difficulty breathing)     Chronic SOB with activity-congested cough started last week- thick white mucous 11. PREGNANCY: "Is there any chance you are pregnant?" "When was your last menstrual period?"       N/a  Protocols used: Leg Swelling and Edema-A-AH

## 2021-05-20 NOTE — Telephone Encounter (Signed)
Appointment scheduled for tomorrow, 05/21/2021.

## 2021-05-21 ENCOUNTER — Other Ambulatory Visit: Payer: Self-pay

## 2021-05-21 ENCOUNTER — Ambulatory Visit (INDEPENDENT_AMBULATORY_CARE_PROVIDER_SITE_OTHER): Payer: Medicare Other | Admitting: Nurse Practitioner

## 2021-05-21 ENCOUNTER — Ambulatory Visit
Admission: RE | Admit: 2021-05-21 | Discharge: 2021-05-21 | Disposition: A | Discharge status: Home / Self Care | Payer: Medicare Other | Source: Ambulatory Visit | Attending: Nurse Practitioner | Admitting: Nurse Practitioner

## 2021-05-21 ENCOUNTER — Encounter: Payer: Self-pay | Admitting: Nurse Practitioner

## 2021-05-21 VITALS — BP 126/68 | HR 75 | Temp 99.1°F | Wt 152.0 lb

## 2021-05-21 DIAGNOSIS — K219 Gastro-esophageal reflux disease without esophagitis: Secondary | ICD-10-CM | POA: Diagnosis not present

## 2021-05-21 DIAGNOSIS — M7121 Synovial cyst of popliteal space [Baker], right knee: Secondary | ICD-10-CM

## 2021-05-21 DIAGNOSIS — R600 Localized edema: Secondary | ICD-10-CM | POA: Insufficient documentation

## 2021-05-21 DIAGNOSIS — R011 Cardiac murmur, unspecified: Secondary | ICD-10-CM | POA: Diagnosis not present

## 2021-05-21 DIAGNOSIS — R0602 Shortness of breath: Secondary | ICD-10-CM | POA: Diagnosis not present

## 2021-05-21 MED ORDER — OMEPRAZOLE 20 MG PO CPDR: 20.0000 mg | DELAYED_RELEASE_CAPSULE | Freq: Every day | ORAL | 4 refills | Status: DC

## 2021-05-21 NOTE — Patient Instructions (Signed)
Edema ?Edema is when you have too much fluid in your body or under your skin. Edema may make your legs, feet, and ankles swell. Swelling often happens in looser tissues, such as around your eyes. This is a common condition. It gets more common as you get older. ?There are many possible causes of edema. These include: ?Eating too much salt (sodium). ?Being on your feet or sitting for a long time. ?Certain medical conditions, such as: ?Pregnancy. ?Heart failure. ?Liver disease. ?Kidney disease. ?Cancer. ?Hot weather may make edema worse. Edema is usually painless. Your skin may look swollen or shiny. ?Follow these instructions at home: ?Medicines ?Take over-the-counter and prescription medicines only as told by your doctor. ?Your doctor may prescribe a medicine to help your body get rid of extra water (diuretic). Take this medicine if you are told to take it. ?Eating and drinking ?Eat a low-salt (low-sodium) diet as told by your doctor. Sometimes, eating less salt may reduce swelling. ?Depending on the cause of your swelling, you may need to limit how much fluid you drink (fluid restriction). ?General instructions ?Raise the injured area above the level of your heart while you are sitting or lying down. ?Do not sit still or stand for a long time. ?Do not wear tight clothes. Do not wear garters on your upper legs. ?Exercise your legs. This can help the swelling go down. ?Wear compression stockings as told by your doctor. It is important that these are the right size. These should be prescribed by your doctor to prevent possible injuries. ?If elastic bandages or wraps are recommended, use them as told by your doctor. ?Contact a doctor if: ?Treatment is not working. ?You have heart, liver, or kidney disease and have symptoms of edema. ?You have sudden and unexplained weight gain. ?Get help right away if: ?You have shortness of breath or chest pain. ?You cannot breathe when you lie down. ?You have pain, redness, or warmth  in the swollen areas. ?You have heart, liver, or kidney disease and get edema all of a sudden. ?You have a fever and your symptoms get worse all of a sudden. ?These symptoms may be an emergency. Get help right away. Call 911. ?Do not wait to see if the symptoms will go away. ?Do not drive yourself to the hospital. ?Summary ?Edema is when you have too much fluid in your body or under your skin. ?Edema may make your legs, feet, and ankles swell. Swelling often happens in looser tissues, such as around your eyes. ?Raise the injured area above the level of your heart while you are sitting or lying down. ?Follow your doctor's instructions about diet and how much fluid you can drink. ?This information is not intended to replace advice given to you by your health care provider. Make sure you discuss any questions you have with your health care provider. ?Document Revised: 02/11/2021 Document Reviewed: 02/11/2021 ?Elsevier Patient Education ? 2022 Else[employer] Inc. ? ?

## 2021-05-21 NOTE — Progress Notes (Signed)
BP 126/68   Pulse 75   Temp 99.1 F (37.3 C) (Oral)   Wt 152 lb (68.9 kg)   LMP  (LMP Unknown)   SpO2 98%   BMI 27.80 kg/m    Subjective:    Patient ID: Tina Mcdonald, female    DOB: 1941-01-28, 80 y.o.   MRN: [id number]  HPI: Tina Mcdonald is a 80 y.o. female  Chief Complaint  Patient presents with   Leg Swelling    Patient is here for bilateral leg swelling that she noticed Tuesday or Wednesday. Patient states it has gotten better than it was and she says that when she did notice the swelling. She says the swelling was worse in her right knee and she only feels any discomfort when she would bend her knee.    Cough    Patient states she has been having a cough for a while now and she has not tried anything over the counter. Patient is coughing up phlegm.    LEG SWELLING & HTN She reports recently had increased swelling to right leg -- both legs at baseline have swelling she reports.  Reports throbbing around knee cap and calf of right leg.  Last saw nephrology 04/02/21 and complained of swelling at that visit -- they added on Lasix.  She reports right leg started to get a little more swollen last week.  Swelling started to go down yesterday.  Is having pain in right leg -- at night worse 10/10, taking Gabapentin per baseline for neuropathy pain with her diabetes. Hypertension status: stable  Satisfied with current treatment? yes Duration of hypertension: chronic BP monitoring frequency:  not checking BP range:  BP medication side effects:  no Medication compliance: good compliance Aspirin: no Recurrent headaches: no Visual changes: no Palpitations: no Dyspnea:  difficulty with going up and down steps Chest pain: no Lower extremity edema: currently Dizzy/lightheaded: no  COUGH Present x 2 weeks.  Woke up one morning with this, has this about every year.  This is improving, noticed it every time she ate -- currently takes no Omeprazole, has not taken in some time.   Has noticed more heart burn without this on board. Duration: weeks Circumstances of initial development of cough: unknown Cough severity: mild Cough description: dry Aggravating factors:   worse after eating Alleviating factors: nothing Status:  better Treatments attempted: none Wheezing: no Shortness of breath:  at baseline Chest pain: no Chest tightness:no Nasal congestion: no Runny nose: no Postnasal drip: no Frequent throat clearing or swallowing: no Hemoptysis: no Fevers: no Night sweats: no Weight loss: no Heartburn: yes Recent foreign travel: no Tuberculosis contacts: no   Relevant past medical, surgical, family and social history reviewed and updated as indicated. Interim medical history since our last visit reviewed. Allergies and medications reviewed and updated.  Review of Systems  Constitutional:  Negative for activity change, appetite change, diaphoresis, fatigue and fever.  Respiratory:  Positive for cough. Negative for chest tightness, shortness of breath and wheezing.   Cardiovascular:  Positive for leg swelling. Negative for chest pain and palpitations.  Gastrointestinal: Negative.   Endocrine: Negative for cold intolerance, heat intolerance, polydipsia, polyphagia and polyuria.  Neurological: Negative.   Psychiatric/Behavioral: Negative.     Per HPI unless specifically indicated above     Objective:    BP 126/68   Pulse 75   Temp 99.1 F (37.3 C) (Oral)   Wt 152 lb (68.9 kg)   LMP  (LMP Unknown)  SpO2 98%   BMI 27.80 kg/m   Wt Readings from Last 3 Encounters:  05/21/21 152 lb (68.9 kg)  03/29/21 152 lb (68.9 kg)  03/01/21 153 lb 12.8 oz (69.8 kg)    Physical Exam Vitals and nursing note reviewed.  Constitutional:      General: She is awake. She is not in acute distress.    Appearance: She is well-developed, well-groomed and overweight. She is not ill-appearing.  HENT:     Head: Normocephalic.     Right Ear: Hearing normal.     Left  Ear: Hearing normal.  Eyes:     General: Lids are normal.        Right eye: No discharge.        Left eye: No discharge.     Conjunctiva/sclera: Conjunctivae normal.     Pupils: Pupils are equal, round, and reactive to light.  Neck:     Thyroid: No thyromegaly.     Vascular: No carotid bruit.  Cardiovascular:     Rate and Rhythm: Normal rate and regular rhythm.     Pulses:          Popliteal pulses are 2+ on the right side and 2+ on the left side.       Dorsalis pedis pulses are 2+ on the right side and 2+ on the left side.       Posterior tibial pulses are 2+ on the right side and 2+ on the left side.     Heart sounds: Murmur heard.  Systolic murmur is present with a grade of 2/6.    No gallop.     Comments: Negative Homans sign bilaterally.  Tenderness to leg on palpation with check of edema.   Pulmonary:     Effort: Pulmonary effort is normal. No accessory muscle usage or respiratory distress.     Breath sounds: Normal breath sounds.  Abdominal:     General: Bowel sounds are normal.     Palpations: Abdomen is soft. There is no hepatomegaly or splenomegaly.  Musculoskeletal:     Cervical back: Normal range of motion and neck supple.     Right knee: Swelling and crepitus present. No erythema or bony tenderness. Decreased range of motion.     Left knee: Normal.     Right lower leg: 3+ Edema present.     Left lower leg: 2+ Edema present.  Lymphadenopathy:     Cervical: No cervical adenopathy.  Skin:    General: Skin is warm and dry.  Neurological:     Mental Status: She is alert and oriented to person, place, and time.  Psychiatric:        Attention and Perception: Attention normal.        Mood and Affect: Mood normal.        Speech: Speech normal.        Behavior: Behavior normal. Behavior is cooperative.        Thought Content: Thought content normal.    Results for orders placed or performed in visit on 03/01/21  Bayer DCA Hb A1c Waived  Result Value Ref Range   HB  A1C (BAYER DCA - WAIVED) 5.4 4.8 - 5.6 %  Lipid Panel w/o Chol/HDL Ratio  Result Value Ref Range   Cholesterol, Total 281 (H) 100 - 199 mg/dL   Triglycerides 108 0 - 149 mg/dL   HDL 66 >39 mg/dL   VLDL Cholesterol Cal 19 5 - 40 mg/dL   LDL Chol Calc (NIH) 196 (  H) 0 - 99 mg/dL  Magnesium  Result Value Ref Range   Magnesium 1.8 1.6 - 2.3 mg/dL  Basic metabolic panel  Result Value Ref Range   Glucose 88 65 - 99 mg/dL   BUN 18 8 - 27 mg/dL   Creatinine, Ser 1.02 (H) 0.57 - 1.00 mg/dL   eGFR 56 (L) >59 mL/min/1.73   BUN/Creatinine Ratio 18 12 - 28   Sodium 143 134 - 144 mmol/L   Potassium 4.2 3.5 - 5.2 mmol/L   Chloride 103 96 - 106 mmol/L   CO2 25 20 - 29 mmol/L   Calcium 9.7 8.7 - 10.3 mg/dL      Assessment & Plan:   Problem List Items Addressed This Visit       Digestive   GERD (gastroesophageal reflux disease)    Exacerbated without medication on board -- increased cough recently after eating.  Restart Omeprazole 20 MG daily and adjust as needed based on labs.  Check Mag level in 3 months.        Relevant Medications   omeprazole (PRILOSEC) 20 MG capsule     Other   Edema of right lower leg - Primary    Ongoing with discomfort reported -- will check for DVT with imaging today and also obtain imaging of right knee.  Check labs today to include CMP, BNP, D dimer, and CBC.  Determine next steps after return of imaging and labs -- return in 3 days.      Relevant Orders   US Venous Img Lower Unilateral Right   Comprehensive metabolic panel   CBC with Differential/Platelet   D-Dimer, Quantitative   TSH   B Nat Peptide   DG Knee Complete 4 Views Right   Heart murmur    Repeat echo ordered last visit and recommended she return to cardiology for follow-up.  Has not had either performed -- will check on echo order and get scheduled.        Follow up plan: Return in about 3 days (around 05/24/2021) for Leg edema.

## 2021-05-21 NOTE — Progress Notes (Signed)
Alerted patient via telephone to results and will place ortho referral.

## 2021-05-21 NOTE — Assessment & Plan Note (Signed)
Exacerbated without medication on board -- increased cough recently after eating.  Restart Omeprazole 20 MG daily and adjust as needed based on labs.  Check Mag level in 3 months.

## 2021-05-21 NOTE — Assessment & Plan Note (Signed)
Ongoing with discomfort reported -- will check for DVT with imaging today and also obtain imaging of right knee.  Check labs today to include CMP, BNP, D dimer, and CBC.  Determine next steps after return of imaging and labs -- return in 3 days.

## 2021-05-21 NOTE — Assessment & Plan Note (Signed)
Repeat echo ordered last visit and recommended she return to cardiology for follow-up.  Has not had either performed -- will check on echo order and get scheduled.

## 2021-05-21 NOTE — Progress Notes (Signed)
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

## 2021-05-21 NOTE — Addendum Note (Signed)
Addended by: Marnee Guarneri T on: 05/21/2021 06:42 PM   Modules accepted: Orders

## 2021-05-22 ENCOUNTER — Ambulatory Visit
Admission: RE | Admit: 2021-05-22 | Discharge: 2021-05-22 | Disposition: A | Discharge status: Home / Self Care | Payer: Medicare Other | Source: Ambulatory Visit | Attending: Nurse Practitioner | Admitting: Nurse Practitioner

## 2021-05-22 DIAGNOSIS — R011 Cardiac murmur, unspecified: Secondary | ICD-10-CM | POA: Diagnosis not present

## 2021-05-22 DIAGNOSIS — I081 Rheumatic disorders of both mitral and tricuspid valves: Secondary | ICD-10-CM | POA: Insufficient documentation

## 2021-05-22 DIAGNOSIS — I517 Cardiomegaly: Secondary | ICD-10-CM | POA: Diagnosis not present

## 2021-05-22 LAB — CBC WITH DIFFERENTIAL/PLATELET
Basophils Absolute: 0 10*3/uL (ref 0.0–0.2)
Basos: 0 %
EOS (ABSOLUTE): 0.2 10*3/uL (ref 0.0–0.4)
Eos: 4 %
Hematocrit: 38.9 % (ref 34.0–46.6)
Hemoglobin: 12.5 g/dL (ref 11.1–15.9)
Immature Grans (Abs): 0 10*3/uL (ref 0.0–0.1)
Immature Granulocytes: 0 %
Lymphocytes Absolute: 1.5 10*3/uL (ref 0.7–3.1)
Lymphs: 32 %
MCH: 26 pg — ABNORMAL LOW (ref 26.6–33.0)
MCHC: 32.1 g/dL (ref 31.5–35.7)
MCV: 81 fL (ref 79–97)
Monocytes Absolute: 0.5 10*3/uL (ref 0.1–0.9)
Monocytes: 10 %
Neutrophils Absolute: 2.5 10*3/uL (ref 1.4–7.0)
Neutrophils: 54 %
Platelets: 212 10*3/uL (ref 150–450)
RBC: 4.8 x10E6/uL (ref 3.77–5.28)
RDW: 12.9 % (ref 11.7–15.4)
WBC: 4.8 10*3/uL (ref 3.4–10.8)

## 2021-05-22 LAB — ECHOCARDIOGRAM COMPLETE
AR max vel: 2.23 cm2
AV Area VTI: 1.93 cm2
AV Area mean vel: 1.94 cm2
AV Mean grad: 5 mmHg
AV Peak grad: 9.2 mmHg
Ao pk vel: 1.52 m/s
Area-P 1/2: 2.94 cm2
MV VTI: 2.01 cm2
S' Lateral: 2.8 cm

## 2021-05-22 LAB — D-DIMER, QUANTITATIVE: D-DIMER: 2 mg/L FEU — ABNORMAL HIGH (ref 0.00–0.49)

## 2021-05-22 LAB — COMPREHENSIVE METABOLIC PANEL
ALT: 9 IU/L (ref 0–32)
AST: 15 IU/L (ref 0–40)
Albumin/Globulin Ratio: 1.5 (ref 1.2–2.2)
Albumin: 4.1 g/dL (ref 3.7–4.7)
Alkaline Phosphatase: 68 IU/L (ref 44–121)
BUN/Creatinine Ratio: 17 (ref 12–28)
BUN: 17 mg/dL (ref 8–27)
Bilirubin Total: 0.2 mg/dL (ref 0.0–1.2)
CO2: 26 mmol/L (ref 20–29)
Calcium: 9.6 mg/dL (ref 8.7–10.3)
Chloride: 103 mmol/L (ref 96–106)
Creatinine, Ser: 1.01 mg/dL — ABNORMAL HIGH (ref 0.57–1.00)
Globulin, Total: 2.7 g/dL (ref 1.5–4.5)
Glucose: 87 mg/dL (ref 70–99)
Potassium: 4.3 mmol/L (ref 3.5–5.2)
Sodium: 144 mmol/L (ref 134–144)
Total Protein: 6.8 g/dL (ref 6.0–8.5)
eGFR: 57 mL/min/{1.73_m2} — ABNORMAL LOW (ref >59)

## 2021-05-22 LAB — BRAIN NATRIURETIC PEPTIDE: BNP: 73.6 pg/mL (ref 0.0–100.0)

## 2021-05-22 LAB — TSH: TSH: 1.17 u[IU]/mL (ref 0.450–4.500)

## 2021-05-22 NOTE — Progress Notes (Signed)
*  PRELIMINARY RESULTS* Echocardiogram 2D Echocardiogram has been performed.  [name] 05/22/2021, 11:37 AM

## 2021-05-22 NOTE — Progress Notes (Signed)
Contacted via [place] echocardiogram has returned: - Ejection fraction show heart is pumping well.  You do have some mild enlargement of the left ventricle, which is something we see at times with high blood pressure, even if well controlled at this time.   - You do have some mild regurgitation at the mitral valve, this means a little blood flowing backwards on occasion and some at tricuspid valve too, which may explain murmur I have heard.  I do recommend you get back into to see Dr. Clayborn Bigness for annual visits.  Looks like you have not seen him since 2020, please call his office and schedule visit.  Any questions?

## 2021-05-22 NOTE — Progress Notes (Signed)
Contacted via MyChart   Good evening Lianny, I have two messages coming your way, first is labs: - Kidney function remains stable at this time with no decline, we will continue to monitor.  I tried to reach out to [facility] nephrologist today, will try again tomorrow to update them. - CBC shows no anemia - D dimer was elevated, but imaging showed no clot -- so this may be related to other chronic health issues. - Thyroid level norma.  BNP is normal, no signs of heart failure:)  Any questions? Keep being amazing!!  Thank you for allowing me to participate in your care.  I appreciate you. Kindest regards, Clay Menser

## 2021-05-23 ENCOUNTER — Telehealth: Payer: Self-pay | Admitting: Nurse Practitioner

## 2021-05-23 NOTE — Telephone Encounter (Signed)
Copied from [place] [id number]. Topic: General - Other >> May 23, 2021  9:56 AM Leward Quan A wrote: Reason for CRM: Patient called in to inform Jolene [name] that the swelling in her legs is gone and want to come in next Friday 05/31/21 only if that is ok. Please call patient today with an answer at Ph# [phone]

## 2021-05-24 ENCOUNTER — Encounter: Payer: Self-pay | Admitting: Nurse Practitioner

## 2021-05-24 ENCOUNTER — Other Ambulatory Visit: Payer: Self-pay

## 2021-05-24 ENCOUNTER — Ambulatory Visit (INDEPENDENT_AMBULATORY_CARE_PROVIDER_SITE_OTHER): Payer: Medicare Other | Admitting: Nurse Practitioner

## 2021-05-24 VITALS — BP 107/68 | HR 68 | Temp 98.1°F | Wt 152.0 lb

## 2021-05-24 DIAGNOSIS — N1832 Chronic kidney disease, stage 3b: Secondary | ICD-10-CM

## 2021-05-24 DIAGNOSIS — K219 Gastro-esophageal reflux disease without esophagitis: Secondary | ICD-10-CM

## 2021-05-24 DIAGNOSIS — E1122 Type 2 diabetes mellitus with diabetic chronic kidney disease: Secondary | ICD-10-CM

## 2021-05-24 DIAGNOSIS — E1159 Type 2 diabetes mellitus with other circulatory complications: Secondary | ICD-10-CM | POA: Diagnosis not present

## 2021-05-24 DIAGNOSIS — E1149 Type 2 diabetes mellitus with other diabetic neurological complication: Secondary | ICD-10-CM | POA: Diagnosis not present

## 2021-05-24 DIAGNOSIS — D631 Anemia in chronic kidney disease: Secondary | ICD-10-CM | POA: Diagnosis not present

## 2021-05-24 DIAGNOSIS — Z888 Allergy status to other drugs, medicaments and biological substances status: Secondary | ICD-10-CM | POA: Diagnosis not present

## 2021-05-24 DIAGNOSIS — I152 Hypertension secondary to endocrine disorders: Secondary | ICD-10-CM

## 2021-05-24 DIAGNOSIS — E1169 Type 2 diabetes mellitus with other specified complication: Secondary | ICD-10-CM

## 2021-05-24 DIAGNOSIS — I517 Cardiomegaly: Secondary | ICD-10-CM

## 2021-05-24 DIAGNOSIS — N183 Chronic kidney disease, stage 3 unspecified: Secondary | ICD-10-CM | POA: Diagnosis not present

## 2021-05-24 DIAGNOSIS — E785 Hyperlipidemia, unspecified: Secondary | ICD-10-CM | POA: Diagnosis not present

## 2021-05-24 LAB — BAYER DCA HB A1C WAIVED: HB A1C (BAYER DCA - WAIVED): 5.9 % — ABNORMAL HIGH (ref 4.8–5.6)

## 2021-05-24 NOTE — Patient Instructions (Signed)

## 2021-05-24 NOTE — Assessment & Plan Note (Signed)
Chronic, ongoing.  Continue current medication regimen and adjust as needed.  Continue collaboration with nephrology locally and at [facility], recent notes and labs reviewed.  Return in 3 months.

## 2021-05-24 NOTE — Assessment & Plan Note (Signed)
Chronic, ongoing, unable to take statin due to rhabdo with these and subsequent AKI with ongoing CKD now.  Check Lipid panel today, could consider Repatha in upcoming months if cholesterol elevations.  Discussed with patient. Return in 3 months.

## 2021-05-24 NOTE — Progress Notes (Addendum)
BP 107/68   Pulse 68   Temp 98.1 F (36.7 C) (Oral)   Wt 152 lb (68.9 kg)   LMP  (LMP Unknown)   SpO2 97%   BMI 27.80 kg/m    Subjective:    Patient ID: Tina Mcdonald, female    DOB: 03-22-1941, 80 y.o.   MRN: [id number]  HPI: Tina Mcdonald is a 80 y.o. female  Chief Complaint  Patient presents with   Diabetes   Hypertension   Hyperlipidemia   Chronic Kidney Disease   Has Bakers cyst right knee, referral in to see ortho -- reports her swelling and pain is stable at this time.  Has restarted her Omeprazole and her cough has improved.  DIABETES Last A1c in June was 6.8%.  No current medications for diabetes, is focused on diet.  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 Hypoglycemic episodes:no Polydipsia/polyuria: no Visual disturbance: no Chest pain: no Paresthesias: no Glucose Monitoring: yes             Accucheck frequency: occasional             Fasting glucose: none             Post prandial:             Evening:             Before meals: Taking Insulin?: no             Long acting insulin:             Short acting insulin: Blood Pressure Monitoring: not checking Retinal Examination: Not Up To Date -- Dr. Gloriann Loan previous -- would like to transfer to [place] Exam: Up to Date Pneumovax: Up to Date Influenza: Up to Date Aspirin: no    HYPERTENSION / HYPERLIPIDEMIA Continues on Amlodipine, Carvedilol, Lasix, Avapro -- has an follow-up coming up in January with Dr. Clayborn Bigness. Has history of aortic atherosclerosis noted on past CT 01/26/17.  Was taken off statin therapy due to rhabdo with subsequent CKD.  Last echo was 05/22/21 with mild LVH, EF >60-65%, mild mitral valve regurgitation. Satisfied with current treatment? yes Duration of hypertension: chronic BP monitoring frequency: once a week BP range: 120/70 range BP medication side effects: no Duration of hyperlipidemia: chronic Cholesterol medication side effects: no Cholesterol  supplements: none Medication compliance: good compliance Aspirin: no Recent stressors: no Recurrent headaches: no Visual changes: no Palpitations: no Dyspnea: occasional Chest pain: no Lower extremity edema: improved Dizzy/lightheaded: no   CHRONIC KIDNEY DISEASE Currently followed by nephrology.  They want her to start taking Vitamin D.  Recent levels stable. CKD status: stable Medications renally dose: yes Previous renal evaluation: yes Pneumovax:  Up to Date Influenza Vaccine:  Up to Date   Relevant past medical, surgical, family and social history reviewed and updated as indicated. Interim medical history since our last visit reviewed. Allergies and medications reviewed and updated.  Review of Systems  Constitutional:  Negative for activity change, appetite change, diaphoresis, fatigue and fever.  Respiratory:  Negative for cough, chest tightness, shortness of breath and wheezing.   Cardiovascular:  Negative for chest pain, palpitations and leg swelling.  Gastrointestinal: Negative.   Endocrine: Negative for cold intolerance, heat intolerance, polydipsia, polyphagia and polyuria.  Neurological: Negative.   Psychiatric/Behavioral: Negative.     Per HPI unless specifically indicated above     Objective:    BP 107/68   Pulse 68   Temp 98.1 F (  36.7 C) (Oral)   Wt 152 lb (68.9 kg)   LMP  (LMP Unknown)   SpO2 97%   BMI 27.80 kg/m   Wt Readings from Last 3 Encounters:  05/24/21 152 lb (68.9 kg)  05/21/21 152 lb (68.9 kg)  03/29/21 152 lb (68.9 kg)    Physical Exam Vitals and nursing note reviewed.  Constitutional:      General: She is awake. She is not in acute distress.    Appearance: She is well-developed, well-groomed and overweight. She is not ill-appearing.  HENT:     Head: Normocephalic.     Right Ear: Hearing normal.     Left Ear: Hearing normal.  Eyes:     General: Lids are normal.        Right eye: No discharge.        Left eye: No discharge.      Conjunctiva/sclera: Conjunctivae normal.     Pupils: Pupils are equal, round, and reactive to light.  Neck:     Thyroid: No thyromegaly.     Vascular: No carotid bruit.  Cardiovascular:     Rate and Rhythm: Normal rate and regular rhythm.     Heart sounds: Murmur heard.  Systolic murmur is present with a grade of 2/6.    No gallop.  Pulmonary:     Effort: Pulmonary effort is normal. No accessory muscle usage or respiratory distress.     Breath sounds: Normal breath sounds.  Abdominal:     General: Bowel sounds are normal.     Palpations: Abdomen is soft. There is no hepatomegaly or splenomegaly.  Musculoskeletal:     Cervical back: Normal range of motion and neck supple.     Right lower leg: 1+ Edema present.     Left lower leg: 1+ Edema present.  Lymphadenopathy:     Cervical: No cervical adenopathy.  Skin:    General: Skin is warm and dry.  Neurological:     Mental Status: She is alert and oriented to person, place, and time.  Psychiatric:        Attention and Perception: Attention normal.        Mood and Affect: Mood normal.        Speech: Speech normal.        Behavior: Behavior normal. Behavior is cooperative.        Thought Content: Thought content normal.   Diabetic Foot Exam - Simple   Simple Foot Form Visual Inspection No deformities, no ulcerations, no other skin breakdown bilaterally: Yes Sensation Testing Intact to touch and monofilament testing bilaterally: Yes Pulse Check Posterior Tibialis and Dorsalis pulse intact bilaterally: Yes Comments     Results for orders placed or performed during the hospital encounter of 05/22/21  ECHOCARDIOGRAM COMPLETE  Result Value Ref Range   Ao pk vel 1.52 m/s   AV Area VTI 1.93 cm2   AR max vel 2.23 cm2   AV Mean grad 5.0 mmHg   AV Peak grad 9.2 mmHg   S' Lateral 2.80 cm   AV Area mean vel 1.94 cm2   Area-P 1/2 2.94 cm2   MV VTI 2.01 cm2      Assessment & Plan:   Problem List Items Addressed This Visit        Cardiovascular and Mediastinum   Hypertension associated with diabetes ([place])    Chronic, ongoing with BP at goal today and at goal at home. Recommend she monitor BP at least a few mornings a week at  home and document.  DASH diet at home.  Continue current medication regimen and adjust as needed.  Labs next visit: Lipid panel today.  Return in 3 months.       Relevant Orders   Bayer DCA Hb A1c Waived   LVH (left ventricular hypertrophy)    Moderate on echo recently -- continue to collaborate with cardiology.        Digestive   GERD (gastroesophageal reflux disease)    Improved with Omeprazole on board -- will continue this at this time, as improvement in symptoms and cough.  Check Mag level annually.        Endocrine   CKD stage 3 due to type 2 diabetes mellitus ([place]) - Primary    Chronic, ongoing.  Continue current medication regimen and adjust as needed.  Continue collaboration with nephrology locally and at [facility], recent notes and labs reviewed.  Return in 3 months.      Relevant Orders   Bayer [place] Hb A1c Waived   Ambulatory referral to Ophthalmology   Diabetic neuropathy ([place])    Chronic, ongoing with A1c 5.9% today.  Continue diet control and weight management at this time + Gabapentin for neuropathy + continuing to monitor blood sugar daily.  BMP today to ensure no further renal dosing needed and will adjust medication doses as needed. Return in 3 months.      Relevant Orders   Ambulatory referral to Ophthalmology   Hyperlipidemia associated with type 2 diabetes mellitus ([place])    Chronic, ongoing, unable to take statin due to rhabdo with these and subsequent AKI with ongoing CKD now.  Check Lipid panel today, could consider Repatha in upcoming months if cholesterol elevations.  Discussed with patient. Return in 3 months.      Relevant Orders   Bayer DCA Hb A1c Waived   Lipid Panel w/o Chol/HDL Ratio     Genitourinary   Anemia in chronic kidney disease (CKD)     Chronic, ongoing.  Continue current medication regimen and collaboration with hematology and nephrology, recent notes and labs reviewed.  Recent labs showed improved anemia.        Other   Allergy to statin medication    With rhabdo and AKI -- consider Repatha if medication needed in future.        Follow up plan: Return in about 3 months (around 08/22/2021) for T2DM, HTN/HLD, CKD, GERD.

## 2021-05-24 NOTE — Assessment & Plan Note (Signed)
Chronic, ongoing with A1c 5.9% today.  Continue diet control and weight management at this time + Gabapentin for neuropathy + continuing to monitor blood sugar daily.  BMP today to ensure no further renal dosing needed and will adjust medication doses as needed. Return in 3 months.

## 2021-05-24 NOTE — Assessment & Plan Note (Signed)
Improved with Omeprazole on board -- will continue this at this time, as improvement in symptoms and cough.  Check Mag level annually.

## 2021-05-24 NOTE — Telephone Encounter (Signed)
Pt was seen in the office today.  

## 2021-05-24 NOTE — Assessment & Plan Note (Signed)
With rhabdo and AKI -- consider Repatha if medication needed in future.

## 2021-05-24 NOTE — Assessment & Plan Note (Signed)
Chronic, ongoing with BP at goal today and at goal at home. Recommend she monitor BP at least a few mornings a week at home and document.  DASH diet at home.  Continue current medication regimen and adjust as needed.  Labs next visit: Lipid panel today.  Return in 3 months.

## 2021-05-24 NOTE — Assessment & Plan Note (Signed)
Moderate on echo recently -- continue to collaborate with cardiology.

## 2021-05-24 NOTE — Assessment & Plan Note (Signed)
Chronic, ongoing.  Continue current medication regimen and collaboration with hematology and nephrology, recent notes and labs reviewed.  Recent labs showed improved anemia.

## 2021-05-25 ENCOUNTER — Other Ambulatory Visit: Payer: Self-pay | Admitting: Nurse Practitioner

## 2021-05-25 LAB — LIPID PANEL W/O CHOL/HDL RATIO
Cholesterol, Total: 279 mg/dL — ABNORMAL HIGH (ref 100–199)
HDL: 66 mg/dL (ref >39)
LDL Chol Calc (NIH): 195 mg/dL — ABNORMAL HIGH (ref 0–99)
Triglycerides: 102 mg/dL (ref 0–149)
VLDL Cholesterol Cal: 18 mg/dL (ref 5–40)

## 2021-05-25 MED ORDER — PRALUENT 75 MG/ML ~~LOC~~ SOAJ: 75.0000 mg | SUBCUTANEOUS | 12 refills | Status: DC

## 2021-05-25 NOTE — Progress Notes (Signed)
Contacted via [place] -- may need PA for Praluent  Good morning Brittley, your cholesterol levels have returned and remain quite elevated.  At this time I would like to try Praluent or Repatha, I will see if either will be covered by your insurance and work on prior authorization if needed for these.  These are the injectables I was discussing to help lower cholesterol.  Since we can not use Zetia or statins due to your history, at this time to help prevent stroke it would be better to trial one of the injectables.  I would like you to pick up and then schedule appointment with me 4 weeks after starting -- if you need help on how to inject let me know and I can assist you in office or one of my nurses can via nurse visit only.  Any questions? Keep being amazing!!  Thank you for allowing me to participate in your care.  I appreciate you. Kindest regards, Kaydi Kley

## 2021-05-27 DIAGNOSIS — M1711 Unilateral primary osteoarthritis, right knee: Secondary | ICD-10-CM | POA: Diagnosis not present

## 2021-05-30 ENCOUNTER — Other Ambulatory Visit: Payer: Self-pay | Admitting: *Deleted

## 2021-05-30 DIAGNOSIS — D649 Anemia, unspecified: Secondary | ICD-10-CM

## 2021-05-31 ENCOUNTER — Ambulatory Visit: Payer: Medicare Other | Admitting: Nurse Practitioner

## 2021-06-07 ENCOUNTER — Other Ambulatory Visit: Payer: Medicare Other

## 2021-06-10 ENCOUNTER — Ambulatory Visit: Payer: Medicare Other | Admitting: Oncology

## 2021-06-19 ENCOUNTER — Other Ambulatory Visit: Payer: Self-pay | Admitting: *Deleted

## 2021-06-19 DIAGNOSIS — D649 Anemia, unspecified: Secondary | ICD-10-CM

## 2021-06-19 DIAGNOSIS — N183 Chronic kidney disease, stage 3 unspecified: Secondary | ICD-10-CM

## 2021-06-26 ENCOUNTER — Inpatient Hospital Stay: Payer: Medicare Other | Attending: Oncology

## 2021-06-27 ENCOUNTER — Telehealth: Payer: Self-pay | Admitting: Oncology

## 2021-06-27 ENCOUNTER — Inpatient Hospital Stay: Payer: Medicare Other | Admitting: Oncology

## 2021-06-27 NOTE — Telephone Encounter (Signed)
Pt called to reschedule her appt for today. Call back at [phone]

## 2021-07-09 DIAGNOSIS — N179 Acute kidney failure, unspecified: Secondary | ICD-10-CM | POA: Diagnosis not present

## 2021-07-09 DIAGNOSIS — E1122 Type 2 diabetes mellitus with diabetic chronic kidney disease: Secondary | ICD-10-CM | POA: Diagnosis not present

## 2021-07-09 DIAGNOSIS — D508 Other iron deficiency anemias: Secondary | ICD-10-CM | POA: Diagnosis not present

## 2021-07-09 DIAGNOSIS — N183 Chronic kidney disease, stage 3 unspecified: Secondary | ICD-10-CM | POA: Diagnosis not present

## 2021-07-26 ENCOUNTER — Inpatient Hospital Stay: Payer: Medicare Other | Attending: Oncology

## 2021-07-26 ENCOUNTER — Other Ambulatory Visit: Payer: Self-pay

## 2021-07-26 DIAGNOSIS — D563 Thalassemia minor: Secondary | ICD-10-CM | POA: Diagnosis not present

## 2021-07-26 DIAGNOSIS — D631 Anemia in chronic kidney disease: Secondary | ICD-10-CM | POA: Diagnosis not present

## 2021-07-26 DIAGNOSIS — N1831 Chronic kidney disease, stage 3a: Secondary | ICD-10-CM | POA: Insufficient documentation

## 2021-07-26 DIAGNOSIS — D649 Anemia, unspecified: Secondary | ICD-10-CM

## 2021-07-26 DIAGNOSIS — N183 Anemia in chronic kidney disease: Secondary | ICD-10-CM

## 2021-07-26 LAB — COMPREHENSIVE METABOLIC PANEL
ALT: 12 U/L (ref 0–44)
AST: 16 U/L (ref 15–41)
Albumin: 4 g/dL (ref 3.5–5.0)
Alkaline Phosphatase: 55 U/L (ref 38–126)
Anion gap: 10 (ref 5–15)
BUN: 25 mg/dL — ABNORMAL HIGH (ref 8–23)
CO2: 25 mmol/L (ref 22–32)
Calcium: 9.6 mg/dL (ref 8.9–10.3)
Chloride: 103 mmol/L (ref 98–111)
Creatinine, Ser: 1.03 mg/dL — ABNORMAL HIGH (ref 0.44–1.00)
GFR, Estimated: 55 mL/min — ABNORMAL LOW (ref >60)
Glucose, Bld: 107 mg/dL — ABNORMAL HIGH (ref 70–99)
Potassium: 4 mmol/L (ref 3.5–5.1)
Sodium: 138 mmol/L (ref 135–145)
Total Bilirubin: 0.3 mg/dL (ref 0.3–1.2)
Total Protein: 7.3 g/dL (ref 6.5–8.1)

## 2021-07-26 LAB — CBC WITH DIFFERENTIAL/PLATELET
Abs Immature Granulocytes: 0.01 10*3/uL (ref 0.00–0.07)
Basophils Absolute: 0 10*3/uL (ref 0.0–0.1)
Basophils Relative: 0 %
Eosinophils Absolute: 0.2 10*3/uL (ref 0.0–0.5)
Eosinophils Relative: 4 %
HCT: 36.2 % (ref 36.0–46.0)
Hemoglobin: 11.3 g/dL — ABNORMAL LOW (ref 12.0–15.0)
Immature Granulocytes: 0 %
Lymphocytes Relative: 30 %
Lymphs Abs: 1.7 10*3/uL (ref 0.7–4.0)
MCH: 26.2 pg (ref 26.0–34.0)
MCHC: 31.2 g/dL (ref 30.0–36.0)
MCV: 83.8 fL (ref 80.0–100.0)
Monocytes Absolute: 0.6 10*3/uL (ref 0.1–1.0)
Monocytes Relative: 10 %
Neutro Abs: 3.1 10*3/uL (ref 1.7–7.7)
Neutrophils Relative %: 56 %
Platelets: 228 10*3/uL (ref 150–400)
RBC: 4.32 MIL/uL (ref 3.87–5.11)
RDW: 14.6 % (ref 11.5–15.5)
WBC: 5.6 10*3/uL (ref 4.0–10.5)
nRBC: 0 % (ref 0.0–0.2)

## 2021-07-26 LAB — IRON AND TIBC
Iron: 89 ug/dL (ref 28–170)
Saturation Ratios: 38 % — ABNORMAL HIGH (ref 10.4–31.8)
TIBC: 237 ug/dL — ABNORMAL LOW (ref 250–450)
UIBC: 148 ug/dL

## 2021-07-26 LAB — FERRITIN: Ferritin: 517 ng/mL — ABNORMAL HIGH (ref 11–307)

## 2021-07-29 ENCOUNTER — Other Ambulatory Visit: Payer: Self-pay

## 2021-07-29 ENCOUNTER — Encounter: Payer: Self-pay | Admitting: Oncology

## 2021-07-29 ENCOUNTER — Inpatient Hospital Stay: Payer: Medicare Other | Admitting: Oncology

## 2021-07-29 VITALS — BP 149/62 | HR 74 | Temp 98.7°F | Resp 20 | Wt 161.4 lb

## 2021-07-29 DIAGNOSIS — N1831 Chronic kidney disease, stage 3a: Secondary | ICD-10-CM | POA: Diagnosis not present

## 2021-07-29 DIAGNOSIS — D631 Anemia in chronic kidney disease: Secondary | ICD-10-CM

## 2021-07-29 DIAGNOSIS — N183 Chronic kidney disease, stage 3 unspecified: Secondary | ICD-10-CM | POA: Insufficient documentation

## 2021-07-29 DIAGNOSIS — D563 Thalassemia minor: Secondary | ICD-10-CM | POA: Insufficient documentation

## 2021-07-29 NOTE — Progress Notes (Signed)
Hematology/Oncology Progress note [phone] [phone]      Patient Care Team: [name], NP as PCP - General (Nurse Practitioner) Yolonda Kida, MD (Internal Medicine) Vanita Ingles, RN as Case Manager (General Practice)  REFERRING PROVIDER: [name], NP  CHIEF COMPLAINTS/REASON FOR VISIT:   microcytic anemia, anemia secondary to CKD   INTERVAL HISTORY Tina Mcdonald is a 81 y.o. female who has above history reviewed by me today presents for follow up visit for management of anemia Patient reports feeling some shortness of breath recently.  She feels tired.  Some cough, nonproductive, no fever nausea vomiting At Review of Systems  Constitutional:  Negative for appetite change, chills, fatigue and fever.  HENT:   Negative for hearing loss and voice change.   Eyes:  Negative for eye problems.  Respiratory:  Negative for chest tightness and cough.   Cardiovascular:  Negative for chest pain.  Gastrointestinal:  Negative for abdominal distention, abdominal pain and blood in stool.  Endocrine: Negative for hot flashes.  Genitourinary:  Negative for difficulty urinating and frequency.   Musculoskeletal:  Negative for arthralgias.  Skin:  Negative for itching and rash.  Neurological:  Negative for extremity weakness.  Hematological:  Negative for adenopathy.  Psychiatric/Behavioral:  Negative for confusion.     MEDICAL HISTORY:  Past Medical History:  Diagnosis Date   Anemia    Arthritis    Chronic kidney disease    Colon polyps    Diabetes mellitus without complication (HCC)    Diabetic neuropathy (HCC)    Dyspnea    GERD (gastroesophageal reflux disease)    Hyperlipemia    Hypertension    Osteoarthritis    Radiculopathy    weakness in right leg   Sleep apnea    does not have her cpap anymore   Stress incontinence    Urticaria    Vitamin B 12 deficiency    Wears glasses     SURGICAL HISTORY: Past Surgical  History:  Procedure Laterality Date   ABDOMINAL HYSTERECTOMY     APPENDECTOMY     BREAST BIOPSY Right    right-neg   BREAST BIOPSY Left    us/bx/clip-neg   CARPAL TUNNEL RELEASE     right   COLONOSCOPY     COLONOSCOPY WITH PROPOFOL N/A 12/10/2015   Procedure: COLONOSCOPY WITH PROPOFOL;  Surgeon: Lucilla Lame, MD;  Location: [place];  Service: Endoscopy;  Laterality: N/A;  Diabetic - oral meds Sleep Apnea LATEX allergy   COLONOSCOPY WITH PROPOFOL N/A 01/17/2019   Procedure: COLONOSCOPY WITH PROPOFOL;  Surgeon: Virgel Manifold, MD;  Location: [place];  Service: Endoscopy;  Laterality: N/A;   CYSTOSCOPY     DILATION AND CURETTAGE OF UTERUS     ESOPHAGOGASTRODUODENOSCOPY (EGD) WITH PROPOFOL N/A 01/17/2019   Procedure: ESOPHAGOGASTRODUODENOSCOPY (EGD) WITH PROPOFOL;  Surgeon: Virgel Manifold, MD;  Location: [place];  Service: Endoscopy;  Laterality: N/A;   EYE SURGERY Bilateral    lens implant   GIVENS CAPSULE STUDY N/A 05/04/2019   Procedure: GIVENS CAPSULE STUDY;  Surgeon: Virgel Manifold, MD;  Location: [facility];  Service: Endoscopy;  Laterality: N/A;   LUMBAR FUSION  2009   POLYPECTOMY  12/10/2015   Procedure: POLYPECTOMY;  Surgeon: Lucilla Lame, MD;  Location: [place];  Service: Endoscopy;;   POLYPECTOMY  01/17/2019   Procedure: POLYPECTOMY;  Surgeon: Virgel Manifold, MD;  Location: [place];  Service: Endoscopy;;   RESECTION DISTAL  CLAVICAL Right 11/25/2013   Procedure: RIGHT SHOULDER OPEN RESECTION DISTAL CLAVICAL EXCISION SOFT TISSUE TUMOR SHOULDER DEEP SUBFASCIAL INTRAMUSCULAR/DEBRIDEMENT/ROTATOR CUFF REPAIR/BICEPS TENODESIS/MANIPULATION;  Surgeon: Renette Butters, MD;  Location: [place];  Service: Orthopedics;  Laterality: Right;   SHOULDER ARTHROSCOPY WITH ROTATOR CUFF REPAIR AND SUBACROMIAL DECOMPRESSION Right 11/25/2013   Procedure: SHOULDER ARTHROSCOPY WITH DEBRIDEMENT ROTATOR CUFF  REPAIR  A;  Surgeon: Renette Butters, MD;  Location: [place];  Service: Orthopedics;  Laterality: Right;    SOCIAL HISTORY: Social History   Socioeconomic History   Marital status: Single    Spouse name: Not on file   Number of children: Not on file   Years of education: Not on file   Highest education level: Some college, no degree  Occupational History   Occupation: retired  Tobacco Use   Smoking status: Former    Packs/day: 1.00    Years: 15.00    Pack years: 15.00    Types: Cigarettes    Quit date: 11/22/1997    Years since quitting: 23.6   Smokeless tobacco: Never  Vaping Use   Vaping Use: Never used  Substance and Sexual Activity   Alcohol use: No   Drug use: No   Sexual activity: Not Currently  Other Topics Concern   Not on file  Social History Narrative   Not on file   Social Determinants of Health   Financial Resource Strain: Medium Risk   Difficulty of Paying Living Expenses: Somewhat hard  Food Insecurity: No Food Insecurity   Worried About Charity fundraiser in the Last Year: Never true   Ran Out of Food in the Last Year: Never true  Transportation Needs: No Transportation Needs   Lack of Transportation (Medical): No   Lack of Transportation (Non-Medical): No  Physical Activity: Inactive   Days of Exercise per Week: 0 days   Minutes of Exercise per Session: 0 min  Stress: No Stress Concern Present   Feeling of Stress : Not at all  Social Connections: Not on file  Intimate Partner Violence: Not on file    FAMILY HISTORY: Family History  Problem Relation Age of Onset   Heart disease Mother    Diabetes Mother    Hypertension Mother    Heart disease Father    Hypertension Father    Diabetes Sister    Hypertension Sister    Heart disease Sister    Kidney disease Sister    Diabetes Sister    Hypertension Sister    Breast cancer Cousin 64   Non-Hodgkin's lymphoma Maternal Uncle     ALLERGIES:  is allergic to statins,  contrast media [iodinated contrast media], penicillins, sulfa antibiotics, aloe, latex, neosporin [neomycin-bacitracin zn-polymyx], and other.  MEDICATIONS:  Current Outpatient Medications  Medication Sig Dispense Refill   acetaminophen (TYLENOL) 325 MG tablet Take 650 mg by mouth every 6 (six) hours as needed.     Alirocumab (PRALUENT) 75 MG/ML SOAJ Inject 75 mg into the skin every 14 (fourteen) days. 2 mL 12   amLODipine (NORVASC) 10 MG tablet Take 10 mg by mouth every morning.     blood glucose meter kit and supplies KIT Dispense based on patient and insurance preference. Use up to four times daily as directed. (FOR ICD-9 250.00, 250.01). 1 each 0   carvedilol (COREG) 12.5 MG tablet Take 12.5 mg by mouth 2 (two) times daily with a meal.     chlorthalidone (HYGROTON) 25 MG tablet Take by mouth.  diclofenac sodium (VOLTAREN) 1 % GEL Apply 2 g topically 2 (two) times daily as needed. 1 Tube 2   Ferrous Sulfate (SLOW FE PO) Take 1 tablet by mouth daily.     gabapentin (NEURONTIN) 100 MG capsule TAKE 2 CAPSULES BY MOUTH AT BEDTIME 180 capsule 3   glucose blood test strip Check daily. ICD 10 E11.9 100 each 12   irbesartan (AVAPRO) 300 MG tablet Take 1 tablet (300 mg total) by mouth daily. New blood pressure medicine. 90 tablet 4   omeprazole (PRILOSEC) 20 MG capsule Take 1 capsule (20 mg total) by mouth daily. 90 capsule 4   vitamin C (ASCORBIC ACID) 250 MG tablet Take 250 mg by mouth every other day.     Vitamin D, Ergocalciferol, (DRISDOL) 1.25 MG (50000 UNIT) CAPS capsule Take 50,000 Units by mouth once a week.     furosemide (LASIX) 20 MG tablet Take by mouth.     Current Facility-Administered Medications  Medication Dose Route Frequency Provider Last Rate Last Admin   cyanocobalamin ((VITAMIN B-12)) injection 1,000 mcg  1,000 mcg Intramuscular Q30 days Kathrine Haddock, NP   1,000 mcg at 08/17/20 1022     PHYSICAL EXAMINATION: ECOG PERFORMANCE STATUS: 1 - Symptomatic but completely  ambulatory Vitals:   07/29/21 1301  BP: (!) 149/62  Pulse: 74  Resp: 20  Temp: 98.7 F (37.1 C)  SpO2: 100%   Filed Weights   07/29/21 1301  Weight: 161 lb 6.4 oz (73.2 kg)    Physical Exam Constitutional:      General: She is not in acute distress. HENT:     Head: Normocephalic and atraumatic.  Eyes:     General: No scleral icterus.    Pupils: Pupils are equal, round, and reactive to light.  Cardiovascular:     Rate and Rhythm: Normal rate and regular rhythm.     Heart sounds: Normal heart sounds.  Pulmonary:     Effort: Pulmonary effort is normal. No respiratory distress.     Breath sounds: No wheezing.  Abdominal:     General: Bowel sounds are normal. There is no distension.     Palpations: Abdomen is soft. There is no mass.     Tenderness: There is no abdominal tenderness.  Musculoskeletal:        General: No deformity. Normal range of motion.     Cervical back: Normal range of motion and neck supple.  Skin:    General: Skin is warm and dry.     Findings: No erythema or rash.  Neurological:     Mental Status: She is alert and oriented to person, place, and time.     Cranial Nerves: No cranial nerve deficit.     Coordination: Coordination normal.  Psychiatric:        Behavior: Behavior normal.        Thought Content: Thought content normal.     LABORATORY DATA:  I have reviewed the data as listed Lab Results  Component Value Date   WBC 5.6 07/26/2021   HGB 11.3 (L) 07/26/2021   HCT 36.2 07/26/2021   MCV 83.8 07/26/2021   PLT 228 07/26/2021   Recent Labs    10/26/20 0450 10/27/20 0458 11/01/20 1200 12/26/20 1647 03/01/21 1003 05/21/21 1216 07/26/21 1017  NA 142 141   < > 144 143 144 138  K 3.4* 3.3*   < > 3.7 4.2 4.3 4.0  CL 108 109   < > 103 103 103 103  CO2 25  26   < > 16* '25 26 25  ' GLUCOSE 112* 93   < > 102* 88 87 107*  BUN 22 20   < > 51* 18 17 25*  CREATININE 2.05* 1.91*   < > 6.08* 1.02* 1.01* 1.03*  CALCIUM 8.7* 8.7*   < > 9.4 9.7  9.6 9.6  GFRNONAA 24* 27*  --   --   --   --  55*  PROT  --   --   --  7.5  --  6.8 7.3  ALBUMIN 3.3*  --   --  4.1  --  4.1 4.0  AST  --   --   --  152*  --  15 16  ALT  --   --   --  95*  --  9 12  ALKPHOS  --   --   --  85  --  68 55  BILITOT  --   --   --  0.2  --  0.2 0.3   < > = values in this interval not displayed.    Iron/TIBC/Ferritin/ %Sat    Component Value Date/Time   IRON 89 07/26/2021 1017   IRON 62 12/02/2019 1110   TIBC 237 (L) 07/26/2021 1017   TIBC 253 12/02/2019 1110   FERRITIN 517 (H) 07/26/2021 1017   FERRITIN 134 12/02/2019 1110   IRONPCTSAT 38 (H) 07/26/2021 1017   IRONPCTSAT 25 12/02/2019 1110        ASSESSMENT & PLAN:  1. Anemia due to stage 3a chronic kidney disease ([place])   2. Thalassemia alpha carrier    #Chronic anemia, Labs reviewed and discussed with patient Her hemoglobin is 11.3 07/26/2021 iron panel showed a ferritin of 517, iron saturation of 38. Patient previously takes Slow Fe 3 to 4 days/week. Recently she had blood work done with  nephrologist on 07/09/2021.  At that time iron saturation was 17 and ferritin level of 523.  She was recommended by her nephrologist to increase Slow Fe to 2 times daily. I discussed with the patient that her elevated ferritin and iron saturation could be falsely increased due to acute inflammation. She may have an upper respiratory infection and I recommend patient to contact her primary care provider, recommend COVID-19 and influenza testing. Recommend patient to continue Slow Fe once daily.  Recommend patient to repeat her iron panel with nephrology  Thalassemia carrier, no intervention at this point. .     Orders Placed This Encounter  Procedures   CBC with Differential/Platelet    Standing Status:   Future    Standing Expiration Date:   07/29/2022   Iron and TIBC    Standing Status:   Future    Standing Expiration Date:   07/29/2022    All questions were answered. The patient knows to call the clinic  with any problems questions or concerns.  Follow-up in 6 months Earlie Server, MD, PhD  07/29/2021

## 2021-07-29 NOTE — Progress Notes (Signed)
Patient states she has had shortness of breathe recently. Patient also states she is always tired

## 2021-08-08 ENCOUNTER — Ambulatory Visit: Payer: Self-pay

## 2021-08-08 NOTE — Telephone Encounter (Signed)
°  Chief Complaint: medication question Symptoms: runny nose and sneezing Frequency: 1-2 days Pertinent Negatives: NA Disposition: [] ED /[] Urgent Care (no appt availability in office) / [] Appointment(In office/virtual)/ []  [place] [facility]/ [x] Home Care/ [] Refused Recommended Disposition /[] [place] [facility]/ []  Follow-up with PCP Additional Notes: Pt advised she can take zyrtec with medications. Advised her the only one I wouldn't take with is Iron and just to wait 2 hrs before or after taking. Pt verbalized understanding.   Summary: Medication questions -Zyrtec   Pt wants to know if she can take zyrtec along with her other Rxs, please advise  Best contact: [phone]       Reason for Disposition  Caller has medicine question only, adult not sick, AND triager answers question  Answer Assessment - Initial Assessment Questions 1. NAME of MEDICATION: "What medicine are you calling about?"     Zyrtec 2. QUESTION: "What is your question?" (e.g., double dose of medicine, side effect)     Wanting to see if she can take with other medications 3. PRESCRIBING HCP: "Who prescribed it?" Reason: if prescribed by specialist, call should be referred to that group.     Bought OTC 4. SYMPTOMS: "Do you have any symptoms?"     Sneezing and runny nose 5. SEVERITY: If symptoms are present, ask "Are they mild, moderate or severe?"     mild  Protocols used: Medication Question Call-A-AH

## 2021-08-18 DIAGNOSIS — I340 Nonrheumatic mitral (valve) insufficiency: Secondary | ICD-10-CM | POA: Insufficient documentation

## 2021-08-18 NOTE — Patient Instructions (Signed)

## 2021-08-23 ENCOUNTER — Other Ambulatory Visit: Payer: Self-pay

## 2021-08-23 ENCOUNTER — Encounter: Payer: Self-pay | Admitting: Nurse Practitioner

## 2021-08-23 ENCOUNTER — Ambulatory Visit (INDEPENDENT_AMBULATORY_CARE_PROVIDER_SITE_OTHER): Payer: Medicare Other | Admitting: Nurse Practitioner

## 2021-08-23 VITALS — BP 99/60 | HR 74 | Temp 98.4°F | Ht 62.0 in | Wt 161.2 lb

## 2021-08-23 DIAGNOSIS — D563 Thalassemia minor: Secondary | ICD-10-CM

## 2021-08-23 DIAGNOSIS — G4733 Obstructive sleep apnea (adult) (pediatric): Secondary | ICD-10-CM | POA: Diagnosis not present

## 2021-08-23 DIAGNOSIS — E1159 Type 2 diabetes mellitus with other circulatory complications: Secondary | ICD-10-CM | POA: Diagnosis not present

## 2021-08-23 DIAGNOSIS — N183 Chronic kidney disease, stage 3 unspecified: Secondary | ICD-10-CM | POA: Diagnosis not present

## 2021-08-23 DIAGNOSIS — I517 Cardiomegaly: Secondary | ICD-10-CM | POA: Diagnosis not present

## 2021-08-23 DIAGNOSIS — E559 Vitamin D deficiency, unspecified: Secondary | ICD-10-CM

## 2021-08-23 DIAGNOSIS — E1169 Type 2 diabetes mellitus with other specified complication: Secondary | ICD-10-CM | POA: Diagnosis not present

## 2021-08-23 DIAGNOSIS — I152 Hypertension secondary to endocrine disorders: Secondary | ICD-10-CM | POA: Diagnosis not present

## 2021-08-23 DIAGNOSIS — E1149 Type 2 diabetes mellitus with other diabetic neurological complication: Secondary | ICD-10-CM

## 2021-08-23 DIAGNOSIS — N1831 Chronic kidney disease, stage 3a: Secondary | ICD-10-CM | POA: Diagnosis not present

## 2021-08-23 DIAGNOSIS — E785 Hyperlipidemia, unspecified: Secondary | ICD-10-CM

## 2021-08-23 DIAGNOSIS — Z888 Allergy status to other drugs, medicaments and biological substances status: Secondary | ICD-10-CM

## 2021-08-23 DIAGNOSIS — K219 Gastro-esophageal reflux disease without esophagitis: Secondary | ICD-10-CM | POA: Diagnosis not present

## 2021-08-23 DIAGNOSIS — E1122 Type 2 diabetes mellitus with diabetic chronic kidney disease: Secondary | ICD-10-CM

## 2021-08-23 DIAGNOSIS — D631 Anemia in chronic kidney disease: Secondary | ICD-10-CM

## 2021-08-23 DIAGNOSIS — I340 Nonrheumatic mitral (valve) insufficiency: Secondary | ICD-10-CM | POA: Diagnosis not present

## 2021-08-23 DIAGNOSIS — I700 Atherosclerosis of aorta: Secondary | ICD-10-CM | POA: Diagnosis not present

## 2021-08-23 LAB — MICROALBUMIN, URINE WAIVED
Creatinine, Urine Waived: 100 mg/dL (ref 10–300)
Microalb, Ur Waived: 30 mg/L — ABNORMAL HIGH (ref 0–19)
Microalb/Creat Ratio: <30 mg/g (ref <30)

## 2021-08-23 LAB — BAYER DCA HB A1C WAIVED: HB A1C (BAYER DCA - WAIVED): 5.6 % (ref 4.8–5.6)

## 2021-08-23 NOTE — Assessment & Plan Note (Signed)
Chronic, ongoing.  Continue current medication regimen and adjust as needed.  Continue collaboration with nephrology locally and at [facility], recent notes and labs reviewed -- function improved recent check.  Return in 3 months. ?

## 2021-08-23 NOTE — Assessment & Plan Note (Signed)
Continue Praluent and adjust as needed.  Can not take statins, history of rhabdo and subsequent AKI with this. ?

## 2021-08-23 NOTE — Assessment & Plan Note (Signed)
Improved with Omeprazole on board -- will continue this at this time, as improvement in symptoms and cough.  Check Mag level annually. ?

## 2021-08-23 NOTE — Assessment & Plan Note (Signed)
Moderate on echo recently -- continue to collaborate with cardiology. ?

## 2021-08-23 NOTE — Progress Notes (Signed)
BP 99/60    Pulse 74    Temp 98.4 F (36.9 C) (Oral)    Ht 5\' 2"  (1.575 m)    Wt 161 lb 3.2 oz (73.1 kg)    LMP  (LMP Unknown)    SpO2 96%    BMI 29.48 kg/m    Subjective:    Patient ID: Tina Mcdonald, female    DOB: [birth year], 81 y.o.   MRN: [id number]  HPI: Tina Mcdonald is a 81 y.o. female  Chief Complaint  Patient presents with   Diabetes   Hyperlipidemia   Hypertension   Chronic Kidney Disease   Gastroesophageal Reflux   DIABETES Last A1c 5.9%.  No current medications for diabetes, is focused on diet.  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 Hypoglycemic episodes:no Polydipsia/polyuria: no Visual disturbance: no Chest pain: no Paresthesias: no Glucose Monitoring: yes             Accucheck frequency: occasional             Fasting glucose: 110 range             Post prandial:             Evening:             Before meals: Taking Insulin?: no             Long acting insulin:             Short acting insulin: Blood Pressure Monitoring: not checking Retinal Examination: Not Up To Date -- [facility] coming up Foot Exam: Up to Date Pneumovax: Up to Date Influenza: Up to Date Aspirin: no   GERD Continues on Omeprazole. GERD control status: stable Satisfied with current treatment? yes Heartburn frequency:  Medication side effects: no  Medication compliance: stable Dysphagia: no Odynophagia:  no Hematemesis: no Blood in stool: no EGD: yes   HYPERTENSION / HYPERLIPIDEMIA Continues on Amlodipine, Carvedilol, Chlorthalidone, Avapro -- follows Dr. Clayborn Bigness. Has history of aortic atherosclerosis noted on past CT 01/26/17.  Statin reaction in past that led to admission and currently taking Praluent.    Last echo was 05/22/21 with mild LVH, EF >60-65%, mild mitral valve regurgitation. Satisfied with current treatment? yes Duration of hypertension: chronic BP monitoring frequency: once a week BP range: 120/70 range BP medication side effects:  no Duration of hyperlipidemia: chronic Cholesterol medication side effects: no Cholesterol supplements: none Medication compliance: good compliance Aspirin: no Recent stressors: no Recurrent headaches: no Visual changes: no Palpitations: no Dyspnea: occasional Chest pain: no Lower extremity edema: improved Dizzy/lightheaded: no   CHRONIC KIDNEY DISEASE Currently followed by nephrology.  Saw them 07/09/21 = CRT 0.95, GFR 61, PTH 67, H/H 11.8/36 -- continues on Vitamin D for low levels in past at 12.1.  They stopped Lasix and started Chlorthalidone 15 MG daily. CKD status: stable Medications renally dose: yes Previous renal evaluation: yes Pneumovax:  Up to Date Influenza Vaccine:  Up to Date   ANEMIA Saw hematology on 07/29/21 and they recommended she continue her Slow Fe once daily.  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 Anemia status: controlled Compliance with treatment: good compliance Iron supplementation side effects: no Severity of anemia: mild Fatigue: yes -- has underlying OSA but could never afford CPAP Decreased exercise tolerance: no  Dyspnea on exertion: no Palpitations: no Bleeding: no Pica: no   Relevant past medical, surgical, family and social history reviewed and updated as indicated. Interim medical history since our last  visit reviewed. Allergies and medications reviewed and updated.  Review of Systems  Constitutional:  Negative for activity change, appetite change, diaphoresis, fatigue and fever.  Respiratory:  Negative for cough, chest tightness, shortness of breath and wheezing.   Cardiovascular:  Negative for chest pain, palpitations and leg swelling.  Gastrointestinal: Negative.   Endocrine: Negative for cold intolerance, heat intolerance, polydipsia, polyphagia and polyuria.  Neurological: Negative.   Psychiatric/Behavioral: Negative.     Per HPI unless specifically indicated above     Objective:    BP 99/60    Pulse 74    Temp 98.4 F (36.9  C) (Oral)    Ht 5\' 2"  (1.575 m)    Wt 161 lb 3.2 oz (73.1 kg)    LMP  (LMP Unknown)    SpO2 96%    BMI 29.48 kg/m   Wt Readings from Last 3 Encounters:  08/23/21 161 lb 3.2 oz (73.1 kg)  07/29/21 161 lb 6.4 oz (73.2 kg)  05/24/21 152 lb (68.9 kg)    Physical Exam Vitals and nursing note reviewed.  Constitutional:      General: She is awake. She is not in acute distress.    Appearance: She is well-developed, well-groomed and overweight. She is not ill-appearing.  HENT:     Head: Normocephalic.     Right Ear: Hearing normal.     Left Ear: Hearing normal.  Eyes:     General: Lids are normal.        Right eye: No discharge.        Left eye: No discharge.     Conjunctiva/sclera: Conjunctivae normal.     Pupils: Pupils are equal, round, and reactive to light.  Neck:     Thyroid: No thyromegaly.     Vascular: No carotid bruit.  Cardiovascular:     Rate and Rhythm: Normal rate and regular rhythm.     Heart sounds: Murmur heard.  Systolic murmur is present with a grade of 2/6.    No gallop.  Pulmonary:     Effort: Pulmonary effort is normal. No accessory muscle usage or respiratory distress.     Breath sounds: Normal breath sounds.  Abdominal:     General: Bowel sounds are normal.     Palpations: Abdomen is soft. There is no hepatomegaly or splenomegaly.  Musculoskeletal:     Cervical back: Normal range of motion and neck supple.     Right lower leg: 1+ Edema present.     Left lower leg: 1+ Edema present.  Lymphadenopathy:     Cervical: No cervical adenopathy.  Skin:    General: Skin is warm and dry.  Neurological:     Mental Status: She is alert and oriented to person, place, and time.  Psychiatric:        Attention and Perception: Attention normal.        Mood and Affect: Mood normal.        Speech: Speech normal.        Behavior: Behavior normal. Behavior is cooperative.        Thought Content: Thought content normal.   Diabetic Foot Exam - Simple   Simple Foot  Form Visual Inspection See comments: Yes Sensation Testing Intact to touch and monofilament testing bilaterally: Yes Pulse Check Posterior Tibialis and Dorsalis pulse intact bilaterally: Yes Comments Fungal disease toenails     Results for orders placed or performed in visit on 08/23/21  Bayer DCA Hb A1c Waived  Result Value Ref Range  HB A1C (BAYER DCA - WAIVED) 5.6 4.8 - 5.6 %  Microalbumin, Urine Waived  Result Value Ref Range   Microalb, Ur Waived 30 (H) 0 - 19 mg/L   Creatinine, Urine Waived 100 10 - 300 mg/dL   Microalb/Creat Ratio <30 <30 mg/g      Assessment & Plan:   Problem List Items Addressed This Visit       Cardiovascular and Mediastinum   Aortic atherosclerosis (HCC)    Chronic, ongoing noted on imaging CT 01/26/17.  Continue daily ASA for prevention and monitor closely.  No statin due to side effects with these.      Relevant Orders   Lipid Panel w/o Chol/HDL Ratio   Hypertension associated with diabetes ([place])    Chronic, ongoing with BP well below goal today and at goal at home -- although recent office visits elsewhere SBP in 140 or above range. Recommend she monitor BP at least a few mornings a week at home and document.  DASH diet at home.  Continue current medication regimen and adjust as needed.  Labs: Lipid and TSH -- BMP up to date with nephrology.  Return in 3 months.       Relevant Orders   Bayer DCA Hb A1c Waived (Completed)   Microalbumin, Urine Waived (Completed)   VITAMIN D 25 Hydroxy (Vit-D Deficiency, Fractures)   TSH   LVH (left ventricular hypertrophy)    Moderate on echo recently -- continue to collaborate with cardiology.      Mitral valve regurgitation    Noted on echo, will continue collaboration with cardiology.        Respiratory   OSA (obstructive sleep apnea)    Would benefit from CPAP as suspect OSA main cause of daytime fatigue, recommend a visit to [facility] for repeat study and supplies, she will look into this  and let provider know.        Digestive   GERD (gastroesophageal reflux disease)    Improved with Omeprazole on board -- will continue this at this time, as improvement in symptoms and cough.  Check Mag level annually.      Relevant Orders   Magnesium     Endocrine   CKD stage 3 due to type 2 diabetes mellitus (HCC)    Chronic, ongoing.  Continue current medication regimen and adjust as needed.  Continue collaboration with nephrology locally and at [facility], recent notes and labs reviewed -- function improved recent check.  Return in 3 months.      Relevant Orders   Bayer DCA Hb A1c Waived (Completed)   Microalbumin, Urine Waived (Completed)   VITAMIN D 25 Hydroxy (Vit-D Deficiency, Fractures)   Diabetic neuropathy (HCC) - Primary    Chronic, ongoing with A1c 5.6% today.  Continue diet control and weight management at this time + Gabapentin for neuropathy + continuing to monitor blood sugar daily.  BMP up to date with nephrology. Return in 3 months.      Relevant Orders   Bayer DCA Hb A1c Waived (Completed)   Microalbumin, Urine Waived (Completed)   Hyperlipidemia associated with type 2 diabetes mellitus (HCC)    Chronic, ongoing, unable to take statin due to rhabdo with these and subsequent AKI with ongoing CKD now.  Check Lipid panel today and continue Praluent at this time as is tolerating -- she may need assist with injections and will bring to office if needed.  Discussed with patient. Return in 3 months.      Relevant  Orders   Bayer DCA Hb A1c Waived (Completed)   Lipid Panel w/o Chol/HDL Ratio     Genitourinary   Anemia due to stage 3 chronic kidney disease (HCC)    Chronic, stable.  Continue collaboration with hematology, recent labs and notes reviewed.      Relevant Orders   Microalbumin, Urine Waived (Completed)   VITAMIN D 25 Hydroxy (Vit-D Deficiency, Fractures)     Other   Allergy to statin medication    Continue Praluent and adjust as needed.  Can not take  statins, history of rhabdo and subsequent AKI with this.      Thalassemia alpha carrier    Continue collaboration with hematology.      Vitamin D deficiency    Noted on [facility] labs 04/02/21 at 12.1, continue supplement and recheck labs today.        Follow up plan: Return in about 3 months (around 11/23/2021) for T2DM, HTN/HLD, CKD, ANEMIA, OSA.

## 2021-08-23 NOTE — Assessment & Plan Note (Signed)
Chronic, ongoing, unable to take statin due to rhabdo with these and subsequent AKI with ongoing CKD now.  Check Lipid panel today and continue Praluent at this time as is tolerating -- she may need assist with injections and will bring to office if needed.  Discussed with patient. Return in 3 months. ?

## 2021-08-23 NOTE — Assessment & Plan Note (Signed)
Noted on [facility] labs 04/02/21 at 12.1, continue supplement and recheck labs today. ?

## 2021-08-23 NOTE — Assessment & Plan Note (Signed)
Chronic, stable.  Continue collaboration with hematology, recent labs and notes reviewed. ?

## 2021-08-23 NOTE — Assessment & Plan Note (Signed)
Continue collaboration with hematology. ?

## 2021-08-23 NOTE — Assessment & Plan Note (Signed)
Would benefit from CPAP as suspect OSA main cause of daytime fatigue, recommend a visit to [facility] for repeat study and supplies, she will look into this and let provider know. 

## 2021-08-23 NOTE — Assessment & Plan Note (Signed)
Noted on echo, will continue collaboration with cardiology. ?

## 2021-08-23 NOTE — Assessment & Plan Note (Signed)
Chronic, ongoing noted on imaging CT 01/26/17.  Continue daily ASA for prevention and monitor closely.  No statin due to side effects with these. ?

## 2021-08-23 NOTE — Assessment & Plan Note (Signed)
Chronic, ongoing with A1c 5.6% today.  Continue diet control and weight management at this time + Gabapentin for neuropathy + continuing to monitor blood sugar daily.  BMP up to date with nephrology. Return in 3 months. ?

## 2021-08-23 NOTE — Assessment & Plan Note (Signed)
Chronic, ongoing with BP well below goal today and at goal at home -- although recent office visits elsewhere SBP in 140 or above range. Recommend she monitor BP at least a few mornings a week at home and document.  DASH diet at home.  Continue current medication regimen and adjust as needed.  Labs: Lipid and TSH -- BMP up to date with nephrology.  Return in 3 months. ? ?

## 2021-08-24 LAB — SPECIMEN STATUS

## 2021-08-25 NOTE — Progress Notes (Signed)
Contacted via [facility] ? ? ?Good morning Tina Mcdonald, your labs have returned with exception of CBC which is still pending, if it comes back abnormal I will let you know.  Overall current labs are stable.  Cholesterol labs are improving with Praluent injections, continue this since you are tolerating.  Any questions? ?Keep being wonderful!!  Thank you for allowing me to participate in your care.  I appreciate you. ?Kindest regards, ?Loni Abdon ?

## 2021-08-27 LAB — LIPID PANEL W/O CHOL/HDL RATIO
Cholesterol, Total: 187 mg/dL (ref 100–199)
HDL: 73 mg/dL (ref >39)
LDL Chol Calc (NIH): 102 mg/dL — ABNORMAL HIGH (ref 0–99)
Triglycerides: 61 mg/dL (ref 0–149)
VLDL Cholesterol Cal: 12 mg/dL (ref 5–40)

## 2021-08-27 LAB — MAGNESIUM: Magnesium: 1.8 mg/dL (ref 1.6–2.3)

## 2021-08-27 LAB — VITAMIN D 25 HYDROXY (VIT D DEFICIENCY, FRACTURES): Vit D, 25-Hydroxy: 38.7 ng/mL (ref 30.0–100.0)

## 2021-08-27 LAB — TSH: TSH: 0.941 u[IU]/mL (ref 0.450–4.500)

## 2021-09-24 DIAGNOSIS — E119 Type 2 diabetes mellitus without complications: Secondary | ICD-10-CM | POA: Diagnosis not present

## 2021-09-24 LAB — HM DIABETES EYE EXAM

## 2021-10-20 DIAGNOSIS — Z8739 Personal history of other diseases of the musculoskeletal system and connective tissue: Secondary | ICD-10-CM | POA: Insufficient documentation

## 2021-10-20 NOTE — Patient Instructions (Addendum)
Baker Cyst  A Baker cyst, also called a popliteal cyst, is a growth that forms at the back of the knee. The cyst forms when the fluid-filled sac (bursa) that cushions the knee joint becomes enlarged. What are the causes? In most cases, a Baker cyst results from another knee problem that causes swelling inside the knee. This makes the fluid inside the knee joint (synovial fluid) flow into the bursa behind the knee, causing the bursa to enlarge. What increases the risk? You may be more likely to develop a Baker cyst if you already have a knee problem, such as: A tear in cartilage that cushions the knee joint (meniscal tear). A tear in the tissues that connect the bones of the knee joint (ligament tear). Knee swelling from osteoarthritis, rheumatoid arthritis, or gout. What are the signs or symptoms? The main symptom of this condition is a lump behind the knee. This may be the only symptom of the condition. The lump may be painful, especially when the knee is straightened. If the lump is painful, the pain may come and go. The knee may also be stiff. Symptoms may quickly get more severe if the cyst breaks open (ruptures). If the cyst ruptures, you may feel the following in your knee and calf: Sudden or worsening pain. Swelling. Bruising. Redness in the calf. A Baker cyst does not always cause symptoms. How is this diagnosed? This condition may be diagnosed based on your symptoms and medical history. Your health care provider will also do a physical exam. This may include: Feeling the cyst to check whether it is tender. Checking your knee for signs of another knee condition that causes swelling. You may have imaging tests, such as: X-rays. MRI. Ultrasound. How is this treated? A Baker cyst that is not painful may go away without treatment. If the cyst gets large or painful, it will likely get better if the underlying knee problem is treated. If needed, treatment for a Baker cyst may  include: Resting. Keeping weight off of the knee. This means not leaning on the knee to support your body weight. Taking NSAIDs, such as ibuprofen, to reduce pain and swelling. Having a procedure to drain the fluid from the cyst with a needle (aspiration). You may also get an injection of a medicine that reduces swelling (steroid). Having surgery. This may be needed if other treatments do not work. This usually involves correcting knee damage and removing the cyst. Follow these instructions at home:  Activity Rest as told by your health care provider. Avoid activities that make pain or swelling worse. Return to your normal activities as told by your health care provider. Ask your health care provider what activities are safe for you. Do not use the injured limb to support your body weight until your health care provider says that you can. Use crutches as told by your health care provider. General instructions Take over-the-counter and prescription medicines only as told by your health care provider. Keep all follow-up visits as told by your health care provider. This is important. Contact a health care provider if: You have knee pain, stiffness, or swelling that does not get better. Get help right away if: You have sudden or worsening pain and swelling in your calf area. Summary A Baker cyst, also called a popliteal cyst, is a growth that forms at the back of the knee. In most cases, a Baker cyst results from another knee problem that causes swelling inside the knee. A Baker cyst that is   not painful may go away without treatment. If needed, treatment for a Baker cyst may include resting, keeping weight off of the knee, medicines, or draining fluid from the cyst. Surgery may be needed if other treatments are not effective. This information is not intended to replace advice given to you by your health care provider. Make sure you discuss any questions you have with your health care  provider. Document Revised: 10/22/2018 Document Reviewed: 10/22/2018 Elsevier Patient Education  2023 [employer]vier Inc.  

## 2021-10-23 ENCOUNTER — Ambulatory Visit (INDEPENDENT_AMBULATORY_CARE_PROVIDER_SITE_OTHER): Payer: Medicare Other | Admitting: Nurse Practitioner

## 2021-10-23 ENCOUNTER — Encounter: Payer: Self-pay | Admitting: Nurse Practitioner

## 2021-10-23 VITALS — BP 115/69 | HR 73 | Temp 98.1°F | Ht 62.0 in | Wt 162.2 lb

## 2021-10-23 DIAGNOSIS — M7121 Synovial cyst of popliteal space [Baker], right knee: Secondary | ICD-10-CM | POA: Diagnosis not present

## 2021-10-23 DIAGNOSIS — E1122 Type 2 diabetes mellitus with diabetic chronic kidney disease: Secondary | ICD-10-CM | POA: Diagnosis not present

## 2021-10-23 DIAGNOSIS — N183 Chronic kidney disease, stage 3 unspecified: Secondary | ICD-10-CM

## 2021-10-23 NOTE — Assessment & Plan Note (Signed)
Chronic, ongoing.  Continue current medication regimen and adjust as needed.  Continue collaboration with nephrology locally and at [facility], recent notes and labs reviewed -- function improved recent check.  Return in 3 months. ?

## 2021-10-23 NOTE — Assessment & Plan Note (Signed)
Acute, pain and swelling improved at this time but cyst palpated on exam.  Will place referral to [facility] for draining of cyst.  She last had this in December 2022.   ?

## 2021-10-23 NOTE — Progress Notes (Signed)
? ?BP 115/69   Pulse 73   Temp 98.1 ?F (36.7 ?C) (Oral)   Ht '5\' 2"'$  (1.575 m)   Wt 162 lb 3.2 oz (73.6 kg)   LMP  (LMP Unknown)   SpO2 95%   BMI 29.67 kg/m?   ? ?Subjective:  ? ? Patient ID: Tina Mcdonald, female    DOB: [birth year], 81 y.o.   MRN: [id number] ? ?HPI: ?Tina Mcdonald is a 81 y.o. female ? ?Chief Complaint  ?Patient presents with  ? Leg Pain  ?  Patient is here for leg pain. Patient states she is having some issues with her right leg again. Patient says when she called for the appointment she was having swelling and tight. Patient says it has since gotten better, but at night she is still having tingling in her right leg. Patient states she is still using the Gabapentin prescription.   ? Leg Swelling  ? ?LEG SWELLING  ?Here today for pain to right leg last week, similar to pain back in November when she had Baker's Cyst.  Was throbbing around knee cap and calf of right leg + leg felt tight, with pins and needles.  This has improved at this time.  Last saw nephrology 07/09/21, they stopped Lasix and started Chlorthalidone 15 MG daily -- she reports noticing improvement with swelling on this.  Unless she eats soul food, which causes increased swelling -- thinks this is why they were swollen over weekend.  Overall symptoms have improved at this time. ? ?B12 level in July was 322, had stopped supplement. ?Hypertension status: stable  ?Satisfied with current treatment? yes ?Duration of hypertension: chronic ?BP monitoring frequency:  not checking ?BP range:  ?BP medication side effects:  no ?Medication compliance: good compliance ?Aspirin: no ?Recurrent headaches: no ?Visual changes: no ?Palpitations: no ?Dyspnea:  difficulty with going up and down steps ?Chest pain: no ?Lower extremity edema: currently ?Dizzy/lightheaded: no ?  ? ?Relevant past medical, surgical, family and social history reviewed and updated as indicated. Interim medical history since our last visit reviewed. ?Allergies and  medications reviewed and updated. ? ?Review of Systems  ?Constitutional:  Negative for activity change, appetite change, diaphoresis, fatigue and fever.  ?Respiratory:  Negative for cough, chest tightness, shortness of breath and wheezing.   ?Cardiovascular:  Positive for leg swelling. Negative for chest pain and palpitations.  ?Gastrointestinal: Negative.   ?Endocrine: Negative for cold intolerance, heat intolerance, polydipsia, polyphagia and polyuria.  ?Neurological: Negative.   ?Psychiatric/Behavioral: Negative.    ? ?Per HPI unless specifically indicated above ? ?   ?Objective:  ?  ?BP 115/69   Pulse 73   Temp 98.1 ?F (36.7 ?C) (Oral)   Ht '5\' 2"'$  (1.575 m)   Wt 162 lb 3.2 oz (73.6 kg)   LMP  (LMP Unknown)   SpO2 95%   BMI 29.67 kg/m?   ?Wt Readings from Last 3 Encounters:  ?10/23/21 162 lb 3.2 oz (73.6 kg)  ?08/23/21 161 lb 3.2 oz (73.1 kg)  ?07/29/21 161 lb 6.4 oz (73.2 kg)  ?  ?Physical Exam ?Vitals and nursing note reviewed.  ?Constitutional:   ?   General: She is awake. She is not in acute distress. ?   Appearance: She is well-developed, well-groomed and overweight. She is not ill-appearing.  ?HENT:  ?   Head: Normocephalic.  ?   Right Ear: Hearing normal.  ?   Left Ear: Hearing normal.  ?Eyes:  ?   General: Lids are normal.     ?  Right eye: No discharge.     ?   Left eye: No discharge.  ?   Conjunctiva/sclera: Conjunctivae normal.  ?   Pupils: Pupils are equal, round, and reactive to light.  ?Neck:  ?   Thyroid: No thyromegaly.  ?   Vascular: No carotid bruit.  ?Cardiovascular:  ?   Rate and Rhythm: Normal rate and regular rhythm.  ?   Pulses:     ?     Popliteal pulses are 2+ on the right side and 2+ on the left side.  ?     Dorsalis pedis pulses are 2+ on the right side and 2+ on the left side.  ?     Posterior tibial pulses are 2+ on the right side and 2+ on the left side.  ?   Heart sounds: Murmur heard.  ?Systolic murmur is present with a grade of 2/6.  ?  No gallop.  ?   Comments: Negative  Homans sign bilaterally.   ?Pulmonary:  ?   Effort: Pulmonary effort is normal. No accessory muscle usage or respiratory distress.  ?   Breath sounds: Normal breath sounds.  ?Abdominal:  ?   General: Bowel sounds are normal.  ?   Palpations: Abdomen is soft. There is no hepatomegaly or splenomegaly.  ?Musculoskeletal:  ?   Cervical back: Normal range of motion and neck supple.  ?   Right knee: Swelling (posterior knee and cystic like mass felt) and crepitus present. No erythema or bony tenderness. Normal range of motion.  ?   Left knee: Normal.  ?   Right lower leg: No edema.  ?   Left lower leg: No edema.  ?Lymphadenopathy:  ?   Cervical: No cervical adenopathy.  ?Skin: ?   General: Skin is warm and dry.  ?Neurological:  ?   Mental Status: She is alert and oriented to person, place, and time.  ?Psychiatric:     ?   Attention and Perception: Attention normal.     ?   Mood and Affect: Mood normal.     ?   Speech: Speech normal.     ?   Behavior: Behavior normal. Behavior is cooperative.     ?   Thought Content: Thought content normal.  ? ? ?Results for orders placed or performed in visit on 09/30/21  ?[facility] DIABETES EYE EXAM  ?Result Value Ref Range  ? [facility] Diabetic Eye Exam No Retinopathy No Retinopathy  ? ?   ?Assessment & Plan:  ? ?Problem List Items Addressed This Visit   ? ?  ? Endocrine  ? CKD stage 3 due to type 2 diabetes mellitus ([place]) - Primary  ?  Chronic, ongoing.  Continue current medication regimen and adjust as needed.  Continue collaboration with nephrology locally and at [facility], recent notes and labs reviewed -- function improved recent check.  Return in 3 months. ? ?  ?  ?  ? Musculoskeletal and Integument  ? Baker cyst, right  ?  Acute, pain and swelling improved at this time but cyst palpated on exam.  Will place referral to [facility] for draining of cyst.  She last had this in December 2022.   ? ?  ?  ? Relevant Orders  ? Ambulatory referral to Orthopedics  ?  ? ?Follow up plan: ?Return for as  scheduled in June. ? ? ? ? ? ?

## 2021-10-24 ENCOUNTER — Ambulatory Visit: Payer: Self-pay | Admitting: *Deleted

## 2021-10-24 NOTE — Telephone Encounter (Signed)
Summary: question about medication  ? Pt requests call back to advise if she is suppose to still be taking the irbesartan (AVAPRO) 300 MG tablet. Pt requests call back. Cb# [phone]   ?  ? ? ?Called patient and reviewed notes from OV with PCP yesterday. Avapro 300 mg is still on current medication list and no documentation is noted in OV to stop taking. Reviewed medications not to continue are cyanocobalamin 1,000 mcg, ergocalciferol 50,000 units , metformin HCL 500 mg, metoprolol succinate.  Patient verbalized understanding. Only change is ascorbic acid 250 mg every other day . Patient will call [facility] for refill or [facility]. Please advise if any other changes for patient.  ? ? ? ?Reason for Disposition ? Caller has medicine question only, adult not sick, AND triager answers question ? ?Answer Assessment - Initial Assessment Questions ?1. NAME of MEDICATION: "What medicine are you calling about?" ?    Irbesartan (AVAPRO) 300 mg  ?2. QUESTION: "What is your question?" (e.g., double dose of medicine, side effect) ?    "Am I supposed to be taking this medication?" ?3. PRESCRIBING HCP: "Who prescribed it?" Reason: if prescribed by specialist, call should be referred to that group. ?    Historical provider and now listed under J. [name], NP ?4. SYMPTOMS: "Do you have any symptoms?" ?    no ?5. SEVERITY: If symptoms are present, ask "Are they mild, moderate or severe?" ?    na ?6. PREGNANCY:  "Is there any chance that you are pregnant?" "When was your last menstrual period?" ?    na ? ?Protocols used: Medication Question Call-A-AH ? ?

## 2021-10-31 DIAGNOSIS — M1711 Unilateral primary osteoarthritis, right knee: Secondary | ICD-10-CM | POA: Diagnosis not present

## 2021-11-08 ENCOUNTER — Encounter (INDEPENDENT_AMBULATORY_CARE_PROVIDER_SITE_OTHER): Payer: Medicare Other | Admitting: Ophthalmology

## 2021-11-08 DIAGNOSIS — I10 Essential (primary) hypertension: Secondary | ICD-10-CM | POA: Diagnosis not present

## 2021-11-08 DIAGNOSIS — H43823 Vitreomacular adhesion, bilateral: Secondary | ICD-10-CM | POA: Diagnosis not present

## 2021-11-08 DIAGNOSIS — H35033 Hypertensive retinopathy, bilateral: Secondary | ICD-10-CM

## 2021-11-08 DIAGNOSIS — E113293 Type 2 diabetes mellitus with mild nonproliferative diabetic retinopathy without macular edema, bilateral: Secondary | ICD-10-CM | POA: Diagnosis not present

## 2021-11-11 ENCOUNTER — Ambulatory Visit: Payer: Self-pay

## 2021-11-11 NOTE — Telephone Encounter (Signed)
  Chief Complaint: Feeling of something in chest - like she needs to burp Symptoms: Feels like she needs to burp - something stuck Frequency: since saturday Pertinent Negatives: Patient denies SOB, shortness of breath, numbness Disposition: '[]'$ ED /'[]'$ Urgent Care (no appt availability in office) / '[x]'$ Appointment(In office/virtual)/ '[]'$  [facility]/ '[]'$ Home Care/ '[]'$ Refused Recommended Disposition /'[]'$ [facility]/ '[]'$  Follow-up with PCP Additional Notes: Pt will monitor for s/s of MI and seek immediate help if needed.  PT states that she feels like she needs to burp, and does without the feeling resolving.  Reason for Disposition  [1] Patient says chest pain feels exactly the same as previously diagnosed "heartburn" AND [2] describes burning in chest AND [3] accompanying sour taste in mouth  Answer Assessment - Initial Assessment Questions 1. LOCATION: "Where does it hurt?"       No pain 2. RADIATION: "Does the pain go anywhere else?" (e.g., into neck, jaw, arms, back)     no 3. ONSET: "When did the chest pain begin?" (Minutes, hours or days)      na 4. PATTERN "Does the pain come and go, or has it been constant since it started?"  "Does it get worse with exertion?"      constant 5. DURATION: "How long does it last" (e.g., seconds, minutes, hours)     ongoing 6. SEVERITY: "How bad is the pain?"  (e.g., Scale 1-10; mild, moderate, or severe)    - MILD (1-3): doesn't interfere with normal activities     - MODERATE (4-7): interferes with normal activities or awakens from sleep    - SEVERE (8-10): excruciating pain, unable to do any normal activities       0/10 7. CARDIAC RISK FACTORS: "Do you have any history of heart problems or risk factors for heart disease?" (e.g., angina, prior heart attack; diabetes, high blood pressure, high cholesterol, smoker, or strong family history of heart disease)     yes 8. PULMONARY RISK FACTORS: "Do you have any history of lung disease?"   (e.g., blood clots in lung, asthma, emphysema, birth control pills)     na 9. CAUSE: "What do you think is causing the chest pain?"     Hernia 10. OTHER SYMPTOMS: "Do you have any other symptoms?" (e.g., dizziness, nausea, vomiting, sweating, fever, difficulty breathing, cough)       no 11. PREGNANCY: "Is there any chance you are pregnant?" "When was your last menstrual period?"       na  Protocols used: Chest Pain-A-AH

## 2021-11-12 ENCOUNTER — Ambulatory Visit (INDEPENDENT_AMBULATORY_CARE_PROVIDER_SITE_OTHER): Payer: Medicare Other | Admitting: Nurse Practitioner

## 2021-11-12 ENCOUNTER — Encounter: Payer: Self-pay | Admitting: Nurse Practitioner

## 2021-11-12 DIAGNOSIS — K449 Diaphragmatic hernia without obstruction or gangrene: Secondary | ICD-10-CM | POA: Diagnosis not present

## 2021-11-12 DIAGNOSIS — K219 Gastro-esophageal reflux disease without esophagitis: Secondary | ICD-10-CM | POA: Diagnosis not present

## 2021-11-12 MED ORDER — OMEPRAZOLE 20 MG PO CPDR: 20.0000 mg | DELAYED_RELEASE_CAPSULE | Freq: Two times a day (BID) | ORAL | 4 refills | Status: DC

## 2021-11-12 NOTE — Patient Instructions (Signed)
Start taking Omeprazole 20 MG twice a day and take over the counter Gas Ex as needed.  Hiatal Hernia  A hiatal hernia occurs when part of the stomach slides above the muscle that separates the abdomen from the chest (diaphragm). A person can be born with a hiatal hernia (congenital), or it may develop over time. In almost all cases of hiatal hernia, only the top part of the stomach pushes through the diaphragm. Many people have a hiatal hernia with no symptoms. The larger the hernia, the more likely it is that you will have symptoms. In some cases, a hiatal hernia allows stomach acid to flow back into the tube that carries food from your mouth to your stomach (esophagus). This may cause heartburn symptoms. Severe heartburn symptoms may mean that you have developed a condition called gastroesophageal reflux disease (GERD). What are the causes? This condition is caused by a weakness in the opening (hiatus) where the esophagus passes through the diaphragm to attach to the upper part of the stomach. A person may be born with a weakness in the hiatus, or a weakness can develop over time. What increases the risk? This condition is more likely to develop in: Older people. Age is a major risk factor for a hiatal hernia, especially if you are over the age of 51. Pregnant women. People who are overweight. People who have frequent constipation. What are the signs or symptoms? Symptoms of this condition usually develop in the form of GERD symptoms. Symptoms include: Heartburn. Belching. Indigestion. Trouble swallowing. Coughing or wheezing. Sore throat. Hoarseness. Chest pain. Nausea and vomiting. How is this diagnosed? This condition may be diagnosed during testing for GERD. Tests that may be done include: X-rays of your stomach or chest. An upper gastrointestinal (GI) series. This is an X-ray exam of your GI tract that is taken after you swallow a chalky liquid that shows up clearly on the  X-ray. Endoscopy. This is a procedure to look into your stomach using a thin, flexible tube that has a tiny camera and light on the end of it. How is this treated? This condition may be treated by: Dietary and lifestyle changes to help reduce GERD symptoms. Medicines. These may include: Over-the-counter antacids. Medicines that make your stomach empty more quickly. Medicines that block the production of stomach acid (H2 blockers). Stronger medicines to reduce stomach acid (proton pump inhibitors). Surgery to repair the hernia, if other treatments are not helping. If you have no symptoms, you may not need treatment. Follow these instructions at home: Lifestyle and activity Do not use any products that contain nicotine or tobacco, such as cigarettes and e-cigarettes. If you need help quitting, ask your health care provider. Try to achieve and maintain a healthy body weight. Avoid putting pressure on your abdomen. Anything that puts pressure on your abdomen increases the amount of acid that may be pushed up into your esophagus. Avoid bending over, especially after eating. Raise the head of your bed by putting blocks under the legs. This keeps your head and esophagus higher than your stomach. Do not wear tight clothing around your chest or stomach. Try not to strain when having a bowel movement, when urinating, or when lifting heavy objects. Eating and drinking Avoid foods that can worsen GERD symptoms. These may include: Fatty foods, like fried foods. Citrus fruits, like oranges or lemon. Other foods and drinks that contain acid, like orange juice or tomatoes. Spicy food. Chocolate. Eat frequent small meals instead of three large meals  a day. This helps prevent your stomach from getting too full. Eat slowly. Do not lie down right after eating. Do not eat 1-2 hours before bed. Do not drink beverages with caffeine. These include cola, coffee, cocoa, and tea. Do not drink alcohol. General  instructions Take over-the-counter and prescription medicines only as told by your health care provider. Keep all follow-up visits as told by your health care provider. This is important. Contact a health care provider if: Your symptoms are not controlled with medicines or lifestyle changes. You are having trouble swallowing. You have coughing or wheezing that will not go away. Get help right away if: Your pain is getting worse. Your pain spreads to your arms, neck, jaw, teeth, or back. You have shortness of breath. You sweat for no reason. You feel sick to your stomach (nauseous) or you vomit. You vomit blood. You have bright red blood in your stools. You have black, tarry stools. Summary A hiatal hernia occurs when part of the stomach slides above the muscle that separates the abdomen from the chest (diaphragm). A person may be born with a weakness in the hiatus, or a weakness can develop over time. Symptoms of hiatal hernia may include heartburn, trouble swallowing, or sore throat. Management of hiatal hernia includes eating frequent small meals instead of three large meals a day. Get help right away if you vomit blood, have bright red blood in your stools, or have black, tarry stools. This information is not intended to replace advice given to you by your health care provider. Make sure you discuss any questions you have with your health care provider. Document Revised: 04/23/2021 Document Reviewed: 05/10/2020 Elsevier Patient Education  [place].

## 2021-11-12 NOTE — Assessment & Plan Note (Signed)
Noted on Endo 2020 -- 2 cm in size.  Refer to GERD plan of care.

## 2021-11-12 NOTE — Assessment & Plan Note (Signed)
Chronic, ongoing with 2 cm hiatal hernia and current increased symptoms after eating fried food.  Will increase Omeprazole to 20 MG BID at this time and reduce back down once symptoms improved.  Recommend Gas Ex as needed for belching.  Monitor foods and reduce fried food intake.  Check Mag level annually.

## 2021-11-12 NOTE — Progress Notes (Signed)
BP (!) 97/58   Pulse 73   Temp 99.5 F (37.5 C) (Oral)   Ht '5\' 2"'$  (1.575 m)   Wt 161 lb 9.6 oz (73.3 kg)   LMP  (LMP Unknown)   SpO2 96%   BMI 29.56 kg/m    Subjective:    Patient ID: Tina Mcdonald, female    DOB: 03-20-1941, 81 y.o.   MRN: [id number]  HPI: STERLING UCCI is a 81 y.o. female  Chief Complaint  Patient presents with   Gas    Patient is here for trapped gas and says she feels that she needs to belch. Patient states she feeling is better today, but she says Saturday when she started having the symptoms up until Monday of having issues with indigestion. Patient states she does not feel like the Omeprazole is helping. Patient states in the esophagus area that it feels that something is trapped and trying to get sore, but denies having any pain.    BELCHING Started on Saturday morning, had ate fish and shrimp the night before (fried food) -- noticed this issue became more prominent with fried food.  Had gas and needed to drink stuff to keep belching.  Kept drinking soda to make her burp.  Is taking Omeprazole daily for GERD.  Denies any pain to abdomen, but was bloating.  At this time it appears to be getting better until she ate something this morning, but not nearly as bad as it was.  Did have some recent reflux.  She has a 2 cm hiatal hernia noted on endoscopy 2020.  Drinks bottled water at home. Duration:days Onset: sudden Episode duration: all day on weekend Frequency: intermittent Alleviating factors: eating Aggravating factors: unknown Status: stable Treatments attempted: PPI Fever: no Nausea: no Vomiting: no Weight loss: no Decreased appetite: no Diarrhea: no Constipation:  mild recently Blood in stool: no Heartburn:  recently yes Jaundice: no Rash: no Dysuria/urinary frequency: no Hematuria: no History of sexually transmitted disease: no Recurrent NSAID use: no   Relevant past medical, surgical, family and social history reviewed and updated  as indicated. Interim medical history since our last visit reviewed. Allergies and medications reviewed and updated.  Review of Systems  Constitutional:  Negative for activity change, appetite change, diaphoresis, fatigue and fever.  Respiratory:  Negative for cough, chest tightness, shortness of breath and wheezing.   Cardiovascular:  Negative for chest pain, palpitations and leg swelling.  Gastrointestinal:  Positive for abdominal distention. Negative for abdominal pain, constipation, diarrhea, nausea and vomiting.  Endocrine: Negative for cold intolerance, heat intolerance, polydipsia, polyphagia and polyuria.  Neurological: Negative.   Psychiatric/Behavioral: Negative.     Per HPI unless specifically indicated above     Objective:    BP (!) 97/58   Pulse 73   Temp 99.5 F (37.5 C) (Oral)   Ht '5\' 2"'$  (1.575 m)   Wt 161 lb 9.6 oz (73.3 kg)   LMP  (LMP Unknown)   SpO2 96%   BMI 29.56 kg/m   Wt Readings from Last 3 Encounters:  11/12/21 161 lb 9.6 oz (73.3 kg)  10/23/21 162 lb 3.2 oz (73.6 kg)  08/23/21 161 lb 3.2 oz (73.1 kg)    Physical Exam Vitals and nursing note reviewed.  Constitutional:      General: She is awake. She is not in acute distress.    Appearance: She is well-developed, well-groomed and overweight. She is not ill-appearing.  HENT:     Head: Normocephalic.  Right Ear: Hearing normal.     Left Ear: Hearing normal.  Eyes:     General: Lids are normal.        Right eye: No discharge.        Left eye: No discharge.     Conjunctiva/sclera: Conjunctivae normal.     Pupils: Pupils are equal, round, and reactive to light.  Neck:     Thyroid: No thyromegaly.     Vascular: No carotid bruit.  Cardiovascular:     Rate and Rhythm: Normal rate and regular rhythm.     Heart sounds: Murmur heard.  Systolic murmur is present with a grade of 2/6.    No gallop.  Pulmonary:     Effort: Pulmonary effort is normal. No accessory muscle usage or respiratory  distress.     Breath sounds: Normal breath sounds.  Abdominal:     General: Bowel sounds are normal. There is no distension.     Palpations: Abdomen is soft. There is no mass.     Tenderness: There is abdominal tenderness in the epigastric area.  Musculoskeletal:     Cervical back: Normal range of motion and neck supple.     Right lower leg: No edema.     Left lower leg: No edema.  Lymphadenopathy:     Cervical: No cervical adenopathy.  Skin:    General: Skin is warm and dry.  Neurological:     Mental Status: She is alert and oriented to person, place, and time.  Psychiatric:        Attention and Perception: Attention normal.        Mood and Affect: Mood normal.        Speech: Speech normal.        Behavior: Behavior normal. Behavior is cooperative.        Thought Content: Thought content normal.    Results for orders placed or performed in visit on 09/30/21  HM DIABETES EYE EXAM  Result Value Ref Range   HM Diabetic Eye Exam No Retinopathy No Retinopathy      Assessment & Plan:   Problem List Items Addressed This Visit       Respiratory   Hiatal hernia    Noted on Endo 2020 -- 2 cm in size.  Refer to GERD plan of care.         Digestive   GERD (gastroesophageal reflux disease)    Chronic, ongoing with 2 cm hiatal hernia and current increased symptoms after eating fried food.  Will increase Omeprazole to 20 MG BID at this time and reduce back down once symptoms improved.  Recommend Gas Ex as needed for belching.  Monitor foods and reduce fried food intake.  Check Mag level annually.       Relevant Medications   omeprazole (PRILOSEC) 20 MG capsule     Follow up plan: Return for return as scheduled.

## 2021-11-19 ENCOUNTER — Encounter: Payer: Self-pay | Admitting: Nurse Practitioner

## 2021-11-20 MED ORDER — IRBESARTAN 300 MG PO TABS: 300.0000 mg | ORAL_TABLET | Freq: Every day | ORAL | 4 refills | Status: DC

## 2021-11-26 DIAGNOSIS — Z23 Encounter for immunization: Secondary | ICD-10-CM | POA: Diagnosis not present

## 2021-11-26 DIAGNOSIS — D508 Other iron deficiency anemias: Secondary | ICD-10-CM | POA: Diagnosis not present

## 2021-11-26 DIAGNOSIS — E1140 Type 2 diabetes mellitus with diabetic neuropathy, unspecified: Secondary | ICD-10-CM | POA: Diagnosis not present

## 2021-11-26 DIAGNOSIS — N183 Chronic kidney disease, stage 3 unspecified: Secondary | ICD-10-CM | POA: Diagnosis not present

## 2021-11-26 DIAGNOSIS — R21 Rash and other nonspecific skin eruption: Secondary | ICD-10-CM | POA: Diagnosis not present

## 2021-11-26 DIAGNOSIS — Z7984 Long term (current) use of oral hypoglycemic drugs: Secondary | ICD-10-CM | POA: Diagnosis not present

## 2021-11-26 DIAGNOSIS — R8281 Pyuria: Secondary | ICD-10-CM | POA: Diagnosis not present

## 2021-11-26 DIAGNOSIS — R410 Disorientation, unspecified: Secondary | ICD-10-CM | POA: Diagnosis not present

## 2021-11-26 DIAGNOSIS — N179 Acute kidney failure, unspecified: Secondary | ICD-10-CM | POA: Diagnosis not present

## 2021-11-26 DIAGNOSIS — I10 Essential (primary) hypertension: Secondary | ICD-10-CM | POA: Diagnosis not present

## 2021-11-26 DIAGNOSIS — Z882 Allergy status to sulfonamides status: Secondary | ICD-10-CM | POA: Diagnosis not present

## 2021-11-26 DIAGNOSIS — E785 Hyperlipidemia, unspecified: Secondary | ICD-10-CM | POA: Diagnosis not present

## 2021-11-26 DIAGNOSIS — E1122 Type 2 diabetes mellitus with diabetic chronic kidney disease: Secondary | ICD-10-CM | POA: Diagnosis not present

## 2021-11-26 DIAGNOSIS — M6282 Rhabdomyolysis: Secondary | ICD-10-CM | POA: Diagnosis not present

## 2021-11-26 DIAGNOSIS — D649 Anemia, unspecified: Secondary | ICD-10-CM | POA: Diagnosis not present

## 2021-11-27 DIAGNOSIS — M7051 Other bursitis of knee, right knee: Secondary | ICD-10-CM | POA: Diagnosis not present

## 2021-11-30 NOTE — Patient Instructions (Incomplete)

## 2021-12-03 ENCOUNTER — Ambulatory Visit (INDEPENDENT_AMBULATORY_CARE_PROVIDER_SITE_OTHER): Payer: Medicare Other | Admitting: Nurse Practitioner

## 2021-12-03 ENCOUNTER — Encounter: Payer: Self-pay | Admitting: Nurse Practitioner

## 2021-12-03 VITALS — BP 121/71 | HR 75 | Temp 98.7°F | Ht 62.0 in | Wt 163.0 lb

## 2021-12-03 DIAGNOSIS — N1831 Chronic kidney disease, stage 3a: Secondary | ICD-10-CM | POA: Diagnosis not present

## 2021-12-03 DIAGNOSIS — K5901 Slow transit constipation: Secondary | ICD-10-CM | POA: Diagnosis not present

## 2021-12-03 DIAGNOSIS — Z8739 Personal history of other diseases of the musculoskeletal system and connective tissue: Secondary | ICD-10-CM

## 2021-12-03 DIAGNOSIS — I340 Nonrheumatic mitral (valve) insufficiency: Secondary | ICD-10-CM

## 2021-12-03 DIAGNOSIS — E1159 Type 2 diabetes mellitus with other circulatory complications: Secondary | ICD-10-CM | POA: Diagnosis not present

## 2021-12-03 DIAGNOSIS — E1149 Type 2 diabetes mellitus with other diabetic neurological complication: Secondary | ICD-10-CM

## 2021-12-03 DIAGNOSIS — I700 Atherosclerosis of aorta: Secondary | ICD-10-CM | POA: Diagnosis not present

## 2021-12-03 DIAGNOSIS — E1169 Type 2 diabetes mellitus with other specified complication: Secondary | ICD-10-CM | POA: Diagnosis not present

## 2021-12-03 DIAGNOSIS — E1122 Type 2 diabetes mellitus with diabetic chronic kidney disease: Secondary | ICD-10-CM | POA: Diagnosis not present

## 2021-12-03 DIAGNOSIS — G4733 Obstructive sleep apnea (adult) (pediatric): Secondary | ICD-10-CM

## 2021-12-03 DIAGNOSIS — I152 Hypertension secondary to endocrine disorders: Secondary | ICD-10-CM

## 2021-12-03 DIAGNOSIS — I517 Cardiomegaly: Secondary | ICD-10-CM | POA: Diagnosis not present

## 2021-12-03 DIAGNOSIS — M7121 Synovial cyst of popliteal space [Baker], right knee: Secondary | ICD-10-CM | POA: Diagnosis not present

## 2021-12-03 DIAGNOSIS — N183 Chronic kidney disease, stage 3 unspecified: Secondary | ICD-10-CM

## 2021-12-03 DIAGNOSIS — D631 Anemia in chronic kidney disease: Secondary | ICD-10-CM

## 2021-12-03 DIAGNOSIS — E785 Hyperlipidemia, unspecified: Secondary | ICD-10-CM

## 2021-12-03 NOTE — Assessment & Plan Note (Signed)
Chronic, ongoing. BP at goal today and on checks at home. Recommend BP checks at home a few times per week at home. DASH diet at home. Continue current medication regimen and adjust as needed. Labs up to date. Return in 3 months.

## 2021-12-03 NOTE — Progress Notes (Signed)
BP 121/71   Pulse 75   Temp 98.7 F (37.1 C) (Oral)   Ht '5\' 2"'$  (1.575 m)   Wt 163 lb (73.9 kg)   LMP  (LMP Unknown)   SpO2 96%   BMI 29.81 kg/m    Subjective:    Patient ID: Tina Mcdonald, female    DOB: Oct 20, 1940, 81 y.o.   MRN: [id number]  HPI: Tina Mcdonald is a 81 y.o. female  Chief Complaint  Patient presents with   Diabetes   Hyperlipidemia   Hypertension   Anemia   Chronic Kidney Disease   Sleep Apnea   NOTE WRITTEN BY FNP STUDENT.  ASSESSMENT AND PLAN OF CARE REVIEWED WITH STUDENT, AGREE WITH ABOVE FINDINGS AND PLAN.   DIABETES Last A1c 6.1%.  No current medications for diabetes, diet focus.  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 Hypoglycemic episodes:no Polydipsia/polyuria: no Visual disturbance: no Chest pain: no Paresthesias: no Glucose Monitoring: yes             Accucheck frequency: occasional             Fasting glucose: 110 range             Post prandial:             Evening:             Before meals: Taking Insulin?: no             Long acting insulin:             Short acting insulin: Blood Pressure Monitoring: occasional Retinal Examination: Up To Date Foot Exam: Up to Date Pneumovax: Up to Date Influenza: Up to Date Aspirin: no   SLEEP APNEA Not wearing CPAP at night, states she is unable to afford the machine. Sleep apnea status: stable without CPAP Duration: chronic CPAP use:  no Treament compliance:poor compliance due to cost Wakes feeling refreshed:  yes Daytime hypersomnolence:  no Fatigue:  yes Insomnia:  no Good sleep hygiene:  yes Difficulty falling asleep:  no Difficulty staying asleep:  no Snoring bothers bed partner:  yes Observed apnea by bed partner: no Obesity:  no Hypertension: yes  Pulmonary hypertension:  no Coronary artery disease:  yes   HYPERTENSION / HYPERLIPIDEMIA Continues on Amlodipine, Carvedilol, Chlorthalidone, Avapro -- follows Dr. Clayborn Bigness. Has history of aortic  atherosclerosis noted on past CT 01/26/17.  Statin reaction in past that led to admission and currently taking Praluent, although she is unsure if she is injecting correctly.    Echo was 05/22/21 last with mild LVH, EF >60-65%, mild mitral valve regurgitation. Satisfied with current treatment? yes Duration of hypertension: chronic BP monitoring frequency: once a week BP range: 120/70 range BP medication side effects: no Duration of hyperlipidemia: chronic Cholesterol medication side effects: no Cholesterol supplements: none Medication compliance: good compliance Aspirin: no Recent stressors: no Recurrent headaches: no Visual changes: no Palpitations: no Dyspnea: occasional Chest pain: no Lower extremity edema: some Dizzy/lightheaded: no   CHRONIC KIDNEY DISEASE Followed by nephrology.  Saw them 11/26/21 = CRT 1.02, GFR 56, PTH 67, H/H 11.8/36.   CKD status: stable Medications renally dose: yes Previous renal evaluation: yes Pneumovax:  Up to Date Influenza Vaccine:  Up to Date   ANEMIA Saw hematology on 07/29/21, recommended she continue her Slow Fe once daily.  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 Anemia status: controlled Compliance with treatment: good compliance Iron supplementation side effects: no Severity of anemia: mild Fatigue:  yes -- has underlying OSA but could never afford CPAP Decreased exercise tolerance: no  Dyspnea on exertion: no Palpitations: no Bleeding: no Pica: no   Relevant past medical, surgical, family and social history reviewed and updated as indicated. Interim medical history since our last visit reviewed. Allergies and medications reviewed and updated.  Review of Systems  Constitutional:  Negative for activity change, appetite change, diaphoresis, fatigue and fever.  HENT: Negative.    Eyes: Negative.   Respiratory:  Negative for cough, chest tightness, shortness of breath and wheezing.   Cardiovascular:  Negative for chest pain, palpitations  and leg swelling.  Gastrointestinal:  Positive for abdominal pain and constipation.  Endocrine: Negative for cold intolerance, heat intolerance, polydipsia, polyphagia and polyuria.  Genitourinary: Negative.   Skin: Negative.   Neurological: Negative.   Psychiatric/Behavioral: Negative.      Per HPI unless specifically indicated above     Objective:    BP 121/71   Pulse 75   Temp 98.7 F (37.1 C) (Oral)   Ht '5\' 2"'$  (1.575 m)   Wt 163 lb (73.9 kg)   LMP  (LMP Unknown)   SpO2 96%   BMI 29.81 kg/m   Wt Readings from Last 3 Encounters:  12/03/21 163 lb (73.9 kg)  11/12/21 161 lb 9.6 oz (73.3 kg)  10/23/21 162 lb 3.2 oz (73.6 kg)    Physical Exam Vitals and nursing note reviewed.  Constitutional:      General: She is awake. She is not in acute distress.    Appearance: She is well-developed, well-groomed and overweight. She is not ill-appearing.  HENT:     Head: Normocephalic.     Right Ear: Hearing normal.     Left Ear: Hearing normal.  Eyes:     General: Lids are normal.        Right eye: No discharge.        Left eye: No discharge.     Conjunctiva/sclera: Conjunctivae normal.     Pupils: Pupils are equal, round, and reactive to light.  Neck:     Thyroid: No thyromegaly.     Vascular: No carotid bruit.  Cardiovascular:     Rate and Rhythm: Normal rate and regular rhythm.     Heart sounds: Murmur heard.     Systolic murmur is present with a grade of 2/6.     No gallop.  Pulmonary:     Effort: Pulmonary effort is normal. No accessory muscle usage or respiratory distress.     Breath sounds: Normal breath sounds.  Abdominal:     General: Bowel sounds are normal.     Palpations: Abdomen is soft. There is no hepatomegaly or splenomegaly.     Tenderness: There is no abdominal tenderness.  Musculoskeletal:     Cervical back: Normal range of motion and neck supple.     Right lower leg: 1+ Edema present.     Left lower leg: 1+ Edema present.  Lymphadenopathy:      Cervical: No cervical adenopathy.  Skin:    General: Skin is warm and dry.  Neurological:     Mental Status: She is alert and oriented to person, place, and time.     Deep Tendon Reflexes: Reflexes are normal and symmetric.     Reflex Scores:      Patellar reflexes are 2+ on the right side and 2+ on the left side. Psychiatric:        Attention and Perception: Attention normal.  Mood and Affect: Mood normal.        Speech: Speech normal.        Behavior: Behavior normal. Behavior is cooperative.        Thought Content: Thought content normal.    Results for orders placed or performed in visit on 09/30/21  HM DIABETES EYE EXAM  Result Value Ref Range   HM Diabetic Eye Exam No Retinopathy No Retinopathy      Assessment & Plan:   Problem List Items Addressed This Visit       Cardiovascular and Mediastinum   Aortic atherosclerosis ([place])    Chronic, ongoing noted on CT imaging 01/26/17. Unable to tolerate statins. Continue taking Praluent every 14 days. Recommend nurse visit with next dose to ensure she is injecting medication appropriately.       Hypertension associated with diabetes (HCC)    Chronic, ongoing. BP at goal today and on checks at home. Recommend BP checks at home a few times per week at home. DASH diet at home. Continue current medication regimen and adjust as needed. Labs up to date. Return in 3 months.       LVH (left ventricular hypertrophy)    Noted on echo previously. Continue collaboration with cardiology.       Mitral valve regurgitation    Noted on echo Nov. 2022. Continue collaboration with cardiology.         Respiratory   OSA (obstructive sleep apnea)    Chronic, ongoing. Continues to experience daytime fatigue. Unable to afford CPAP machine.         Endocrine   CKD stage 3 due to type 2 diabetes mellitus (HCC)    Chronic, ongoing. Continue current medication regimen and adjust as needed. Continue collaboration with nephrology locally and  at [facility], recent labs reviewed. Return in 3 months.       Diabetic neuropathy (HCC) - Primary    Chronic, ongoing. Last A1C 6.1%. Continue diet control and weight management at this time. Gabapentin nightly for neuropathy. Check blood sugar daily at home. Labs up to date. Return in 3 months.       Hyperlipidemia associated with type 2 diabetes mellitus (HCC)    Chronic, ongoing. Unable to take statin due to rhabdo with associated AKI. Continue taking Praulent at this time. Recommend nurse visit with next dose for assistance with injection. Return in 3 months.         Musculoskeletal and Integument   Baker cyst, right    Ongoing, minimal pain and swelling. Followed by [facility].         Genitourinary   Anemia due to stage 3 chronic kidney disease (HCC)    Chronic, stable. Continue collaboration with hematology. Recent labs reviewed.         Other   History of rhabdomyolysis due to statin    Continue Praulent and adjust as needed. Unable to take statins, history of rhabdo and subsequent AKI.      Other Visit Diagnoses     Slow transit constipation       Noted more recently, recommend she start daily Metamucil to assist with this + focus on increased water intake and more leafy greens.       Time: 25 minutes, >50% spent counseling/or care coordination   Follow up plan: Return in about 3 months (around 03/05/2022) for T2DM, HTN/HLD, CKD, ANEMIA, GERD.

## 2021-12-03 NOTE — Assessment & Plan Note (Signed)
Chronic, ongoing. Continues to experience daytime fatigue. Unable to afford CPAP machine.

## 2021-12-03 NOTE — Assessment & Plan Note (Signed)
Noted on echo previously. Continue collaboration with cardiology.

## 2021-12-03 NOTE — Assessment & Plan Note (Signed)
Continue Praulent and adjust as needed. Unable to take statins, history of rhabdo and subsequent AKI. 

## 2021-12-03 NOTE — Progress Notes (Deleted)
Wt 163 lb (73.9 kg)   LMP  (LMP Unknown)   BMI 29.81 kg/m    Subjective:    Patient ID: Tina Mcdonald, female    DOB: 02/17/1941, 81 y.o.   MRN: [id number]  HPI: Tina Mcdonald is a 81 y.o. female  Chief Complaint  Patient presents with   Diabetes   Hyperlipidemia   Hypertension   Anemia   Chronic Kidney Disease   Sleep Apnea   DIABETES Last A1c 6.1%.  No current medications for diabetes, diet focus.  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 Hypoglycemic episodes:no Polydipsia/polyuria: no Visual disturbance: no Chest pain: no Paresthesias: no Glucose Monitoring: yes             Accucheck frequency: occasional             Fasting glucose: 110 range             Post prandial:             Evening:             Before meals: Taking Insulin?: no             Long acting insulin:             Short acting insulin: Blood Pressure Monitoring: not checking Retinal Examination: Up To Date Foot Exam: Up to Date Pneumovax: Up to Date Influenza: Up to Date Aspirin: no   SLEEP APNEA Sleep apnea status: {Blank multiple:19196::"uncontrolled","controlled","better","worse","stable","fluctuating"} Duration: {Blank single:19197::"chronic","days","weeks","months"} Satisfied with current treatment?:  {Blank single:19197::"yes","no"} CPAP use:  {Blank single:19197::"yes","no"} Sleep quality with CPAP use: {Blank single:19197::"excellent","good", average","poor"} Treament compliance:{Blank single:19197::"excellent compliance","good compliance","fair compliance","poor compliance"} Last sleep study:  Treatments attempted:  Wakes feeling refreshed:  {Blank single:19197::"yes","no"} Daytime hypersomnolence:  {Blank single:19197::"yes","no"} Fatigue:  {Blank single:19197::"yes","no"} Insomnia:  {Blank single:19197::"yes","no"} Good sleep hygiene:  {Blank single:19197::"yes","no"} Difficulty falling asleep:  {Blank single:19197::"yes","no"} Difficulty staying asleep:   {Blank single:19197::"yes","no"} Snoring bothers bed partner:  {Blank single:19197::"yes","no"} Observed apnea by bed partner: {Blank single:19197::"yes","no"} Obesity:  {Blank single:19197::"yes","no"} Hypertension: {Blank single:19197::"yes","no"}  Pulmonary hypertension:  {Blank single:19197::"yes","no"} Coronary artery disease:  {Blank single:19197::"yes","no"}   HYPERTENSION / HYPERLIPIDEMIA Continues on Amlodipine, Carvedilol, Chlorthalidone, Avapro -- follows Dr. Clayborn Bigness. Has history of aortic atherosclerosis noted on past CT 01/26/17.  Statin reaction in past that led to admission and currently taking Praluent.    Echo was 05/22/21 last with mild LVH, EF >60-65%, mild mitral valve regurgitation. Satisfied with current treatment? yes Duration of hypertension: chronic BP monitoring frequency: once a week BP range: 120/70 range BP medication side effects: no Duration of hyperlipidemia: chronic Cholesterol medication side effects: no Cholesterol supplements: none Medication compliance: good compliance Aspirin: no Recent stressors: no Recurrent headaches: no Visual changes: no Palpitations: no Dyspnea: occasional Chest pain: no Lower extremity edema: none Dizzy/lightheaded: no   CHRONIC KIDNEY DISEASE Followed by nephrology.  Saw them 11/26/21 = CRT 1.02, GFR 56, PTH 67, H/H 11.8/36.   CKD status: stable Medications renally dose: yes Previous renal evaluation: yes Pneumovax:  Up to Date Influenza Vaccine:  Up to Date   ANEMIA Saw hematology on 07/29/21, recommended she continue her Slow Fe once daily.  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 Anemia status: controlled Compliance with treatment: good compliance Iron supplementation side effects: no Severity of anemia: mild Fatigue: yes -- has underlying OSA but could never afford CPAP Decreased exercise tolerance: no  Dyspnea on exertion: no Palpitations: no Bleeding: no Pica: no   Relevant past medical, surgical, family  and social history reviewed and updated as indicated. Interim medical history since our last visit reviewed. Allergies and medications reviewed and updated.  Review of Systems  Constitutional:  Negative for activity change, appetite change, diaphoresis, fatigue and fever.  Respiratory:  Negative for cough, chest tightness, shortness of breath and wheezing.   Cardiovascular:  Negative for chest pain, palpitations and leg swelling.  Gastrointestinal: Negative.   Endocrine: Negative for cold intolerance, heat intolerance, polydipsia, polyphagia and polyuria.  Neurological: Negative.   Psychiatric/Behavioral: Negative.      Per HPI unless specifically indicated above     Objective:    Wt 163 lb (73.9 kg)   LMP  (LMP Unknown)   BMI 29.81 kg/m   Wt Readings from Last 3 Encounters:  12/03/21 163 lb (73.9 kg)  11/12/21 161 lb 9.6 oz (73.3 kg)  10/23/21 162 lb 3.2 oz (73.6 kg)    Physical Exam Vitals and nursing note reviewed.  Constitutional:      General: She is awake. She is not in acute distress.    Appearance: She is well-developed, well-groomed and overweight. She is not ill-appearing.  HENT:     Head: Normocephalic.     Right Ear: Hearing normal.     Left Ear: Hearing normal.  Eyes:     General: Lids are normal.        Right eye: No discharge.        Left eye: No discharge.     Conjunctiva/sclera: Conjunctivae normal.     Pupils: Pupils are equal, round, and reactive to light.  Neck:     Thyroid: No thyromegaly.     Vascular: No carotid bruit.  Cardiovascular:     Rate and Rhythm: Normal rate and regular rhythm.     Heart sounds: Murmur heard.     Systolic murmur is present with a grade of 2/6.     No gallop.  Pulmonary:     Effort: Pulmonary effort is normal. No accessory muscle usage or respiratory distress.     Breath sounds: Normal breath sounds.  Abdominal:     General: Bowel sounds are normal.     Palpations: Abdomen is soft. There is no hepatomegaly or  splenomegaly.  Musculoskeletal:     Cervical back: Normal range of motion and neck supple.     Right lower leg: 1+ Edema present.     Left lower leg: 1+ Edema present.  Lymphadenopathy:     Cervical: No cervical adenopathy.  Skin:    General: Skin is warm and dry.  Neurological:     Mental Status: She is alert and oriented to person, place, and time.  Psychiatric:        Attention and Perception: Attention normal.        Mood and Affect: Mood normal.        Speech: Speech normal.        Behavior: Behavior normal. Behavior is cooperative.        Thought Content: Thought content normal.    Results for orders placed or performed in visit on 09/30/21  HM DIABETES EYE EXAM  Result Value Ref Range   HM Diabetic Eye Exam No Retinopathy No Retinopathy      Assessment & Plan:   Problem List Items Addressed This Visit       Cardiovascular and Mediastinum   Aortic atherosclerosis ([place])   Hypertension associated with diabetes ([place])     Endocrine   CKD stage 3 due to type 2 diabetes  mellitus ([place])   Diabetic neuropathy ([place]) - Primary   Hyperlipidemia associated with type 2 diabetes mellitus ([place])   Relevant Orders   Lipid Panel w/o Chol/HDL Ratio     Genitourinary   Anemia due to stage 3 chronic kidney disease ([place])     Other   History of rhabdomyolysis due to statin   Relevant Orders   Lipid Panel w/o Chol/HDL Ratio     Follow up plan: No follow-ups on file.

## 2021-12-03 NOTE — Assessment & Plan Note (Signed)
Chronic, ongoing. Last A1C 6.1%. Continue diet control and weight management at this time. Gabapentin nightly for neuropathy. Check blood sugar daily at home. Labs up to date. Return in 3 months.

## 2021-12-03 NOTE — Assessment & Plan Note (Signed)
Chronic, ongoing. Unable to take statin due to rhabdo with associated AKI. Continue taking Praulent at this time. Recommend nurse visit with next dose for assistance with injection. Return in 3 months.

## 2021-12-03 NOTE — Assessment & Plan Note (Signed)
Noted on echo Nov. 2022. Continue collaboration with cardiology.  

## 2021-12-03 NOTE — Assessment & Plan Note (Signed)
Ongoing, minimal pain and swelling. Followed by [facility].

## 2021-12-03 NOTE — Assessment & Plan Note (Signed)
Chronic, stable. Continue collaboration with hematology. Recent labs reviewed.  

## 2021-12-03 NOTE — Assessment & Plan Note (Signed)
Chronic, ongoing noted on CT imaging 01/26/17. Unable to tolerate statins. Continue taking Praluent every 14 days. Recommend nurse visit with next dose to ensure she is injecting medication appropriately.

## 2021-12-03 NOTE — Assessment & Plan Note (Signed)
Chronic, ongoing. Continue current medication regimen and adjust as needed. Continue collaboration with nephrology locally and at [facility], recent labs reviewed. Return in 3 months.  

## 2021-12-05 DIAGNOSIS — E1122 Type 2 diabetes mellitus with diabetic chronic kidney disease: Secondary | ICD-10-CM | POA: Diagnosis not present

## 2021-12-05 DIAGNOSIS — D508 Other iron deficiency anemias: Secondary | ICD-10-CM | POA: Diagnosis not present

## 2021-12-05 DIAGNOSIS — N3000 Acute cystitis without hematuria: Secondary | ICD-10-CM | POA: Diagnosis not present

## 2021-12-05 DIAGNOSIS — N179 Acute kidney failure, unspecified: Secondary | ICD-10-CM | POA: Diagnosis not present

## 2021-12-05 DIAGNOSIS — N183 Chronic kidney disease, stage 3 unspecified: Secondary | ICD-10-CM | POA: Diagnosis not present

## 2021-12-11 ENCOUNTER — Ambulatory Visit: Payer: Medicare Other

## 2021-12-11 NOTE — Progress Notes (Signed)
Patient presented to the office to be assisted with injecting her Praluent injection. Reviewed instructions on injecting with the patient and assisted with injecting the medication. Patient tolerated well and had no further questions.

## 2021-12-19 ENCOUNTER — Ambulatory Visit: Payer: Self-pay

## 2021-12-19 ENCOUNTER — Encounter: Payer: Self-pay | Admitting: Nurse Practitioner

## 2021-12-19 ENCOUNTER — Ambulatory Visit (INDEPENDENT_AMBULATORY_CARE_PROVIDER_SITE_OTHER): Payer: Medicare Other | Admitting: Nurse Practitioner

## 2021-12-19 VITALS — BP 112/61 | HR 73 | Temp 98.3°F | Wt 165.8 lb

## 2021-12-19 DIAGNOSIS — R21 Rash and other nonspecific skin eruption: Secondary | ICD-10-CM | POA: Diagnosis not present

## 2021-12-19 MED ORDER — TRIAMCINOLONE ACETONIDE 0.1 % EX CREA: 1.0000 | TOPICAL_CREAM | Freq: Two times a day (BID) | CUTANEOUS | 0 refills | Status: DC

## 2021-12-19 MED ORDER — PREDNISONE 10 MG PO TABS: 10.0000 mg | ORAL_TABLET | Freq: Every day | ORAL | 0 refills | Status: DC

## 2021-12-19 MED ORDER — TRIAMCINOLONE ACETONIDE 40 MG/ML IJ SUSP
40.0000 mg | Freq: Once | INTRAMUSCULAR | Status: AC
  Administered 2021-12-19: 40 mg via INTRAMUSCULAR

## 2021-12-19 MED ORDER — METHYLPREDNISOLONE SODIUM SUCC 40 MG IJ SOLR: 40.0000 mg | Freq: Once | INTRAMUSCULAR | Status: DC

## 2021-12-19 NOTE — Progress Notes (Signed)
BP 112/61   Pulse 73   Temp 98.3 F (36.8 C) (Oral)   Wt 165 lb 12.8 oz (75.2 kg)   LMP  (LMP Unknown)   SpO2 98%   BMI 30.33 kg/m    Subjective:    Patient ID: Tina Mcdonald, female    DOB: Jan 21, 1941, 81 y.o.   MRN: [id number]  HPI: Tina Mcdonald is a 81 y.o. female  Chief Complaint  Patient presents with   Rash    L arm now progressing to R arm ongoing x 2 weeks c/o itchiness and redness    RASH Duration:   a little over two weeks   Location: arms  Itching: yes Burning: no Redness: yes Oozing: yes Scaling: no Blisters: no Painful: no Fevers: no Change in detergents/soaps/personal care products: no Recent illness: no Recent travel:no History of same: no Context: worse Alleviating factors:  anti itch cream and benadryl Treatments attempted:hydrocortisone cream Shortness of breath: no  Throat/tongue swelling: no Myalgias/arthralgias: no  Relevant past medical, surgical, family and social history reviewed and updated as indicated. Interim medical history since our last visit reviewed. Allergies and medications reviewed and updated.  Review of Systems  HENT:  Negative for trouble swallowing.   Respiratory:  Negative for shortness of breath.   Skin:  Positive for rash.    Per HPI unless specifically indicated above     Objective:    BP 112/61   Pulse 73   Temp 98.3 F (36.8 C) (Oral)   Wt 165 lb 12.8 oz (75.2 kg)   LMP  (LMP Unknown)   SpO2 98%   BMI 30.33 kg/m   Wt Readings from Last 3 Encounters:  12/19/21 165 lb 12.8 oz (75.2 kg)  12/03/21 163 lb (73.9 kg)  11/12/21 161 lb 9.6 oz (73.3 kg)    Physical Exam Vitals and nursing note reviewed.  Constitutional:      General: She is not in acute distress.    Appearance: Normal appearance. She is normal weight. She is not ill-appearing, toxic-appearing or diaphoretic.  HENT:     Head: Normocephalic.     Right Ear: External ear normal.     Left Ear: External ear normal.     Nose: Nose  normal.     Mouth/Throat:     Mouth: Mucous membranes are moist.     Pharynx: Oropharynx is clear.  Eyes:     General:        Right eye: No discharge.        Left eye: No discharge.     Extraocular Movements: Extraocular movements intact.     Conjunctiva/sclera: Conjunctivae normal.     Pupils: Pupils are equal, round, and reactive to light.  Cardiovascular:     Rate and Rhythm: Normal rate and regular rhythm.     Heart sounds: No murmur heard. Pulmonary:     Effort: Pulmonary effort is normal. No respiratory distress.     Breath sounds: Normal breath sounds. No wheezing or rales.  Musculoskeletal:     Cervical back: Normal range of motion and neck supple.  Skin:    General: Skin is warm and dry.     Capillary Refill: Capillary refill takes less than 2 seconds.     Findings: Erythema and rash present. Rash is papular.     Comments: Red raised papules over left forearm and upper arm, right forearm and right cheek.  Neurological:     General: No focal deficit present.  Mental Status: She is alert and oriented to person, place, and time. Mental status is at baseline.  Psychiatric:        Mood and Affect: Mood normal.        Behavior: Behavior normal.        Thought Content: Thought content normal.        Judgment: Judgment normal.     Results for orders placed or performed in visit on 09/30/21  HM DIABETES EYE EXAM  Result Value Ref Range   HM Diabetic Eye Exam No Retinopathy No Retinopathy      Assessment & Plan:   Problem List Items Addressed This Visit   None Visit Diagnoses     Rash    -  Primary   Will give Kenalog in office. Start prednisone tomorrow. Discussed side effects and benefits.  Discussed how to use cream. FU if symptoms do not improve.   Relevant Medications   triamcinolone acetonide (KENALOG-40) injection 40 mg (Start on 12/19/2021  1:30 PM)        Follow up plan: No follow-ups on file.

## 2021-12-19 NOTE — Telephone Encounter (Signed)
  Chief Complaint: Red itchy rash on arms and face Symptoms: Itchy rash on arms and face Frequency: about 10 days Pertinent Negatives: Patient denies fever swelling, difficulty breathing Disposition: '[]'$ ED /'[x]'$ Urgent Care (no appt availability in office) / '[]'$ Appointment(In office/virtual)/ '[]'$  [facility]/ '[]'$ Home Care/ '[]'$ Refused Recommended Disposition /'[]'$ [facility]/ '[]'$  Follow-up with PCP Additional Notes: PT states that about 5 days after receiving 2 vaccines at nephrology office a rash developed on her right arm. Right arm was the site of shingles vaccine. Rash is now from hands to shoulder and on the side of her face. Right side is worse than left. PT states that her arms are red and itchy and she scratches them. Pt is taking benadryl and zyrtec to control reaction. PT denies exposure to poison ivy.   Summary: itchy rash from arms to shoulders   Patient states, bad rash left arm to wrist over a week right aram shoulder to wrist and is itchy, she doesn't want it to get infected from scratching. No appt until Jul 3She is asking for suggestions or what can be done . Pharmacy is [facility] [address]  Phone: [phone]  Fax: [phone]      Reason for Disposition  [1] MODERATE-SEVERE local itching (i.e., interferes with work, school, activities) AND [2] not improved after 24 hours of hydrocortisone cream  Answer Assessment - Initial Assessment Questions 1. DESCRIPTION: "Describe the itching you are having." "Where is it located?"     Both arms and side of face. More on the right side 2. SEVERITY: "How bad is it?"    - MILD - doesn't interfere with normal activities   - MODERATE-SEVERE: interferes with work, school, sleep, or other activities      moderate 3. SCRATCHING: "Are there any scratch marks? Bleeding?"     scratch 4. ONSET: "When did the itching begin?"      4-5 days after OV with nephrology 5. CAUSE: "What do you  think is causing the itching?"      Unsure - vaccine reaction? 6. OTHER SYMPTOMS: "Do you have any other symptoms?"      no 7. PREGNANCY: "Is there any chance you are pregnant?" "When was your last menstrual period?"     na  Protocols used: Itching - Localized-A-AH

## 2021-12-19 NOTE — Telephone Encounter (Signed)
Pt scheduled this afternoon at 1 with Santiago Glad

## 2021-12-20 ENCOUNTER — Ambulatory Visit: Payer: Medicare Other | Admitting: Nurse Practitioner

## 2022-01-22 ENCOUNTER — Telehealth: Payer: Self-pay | Admitting: *Deleted

## 2022-01-22 NOTE — Telephone Encounter (Signed)
Patient called to say they missed a call not sure what the call was about.

## 2022-01-22 NOTE — Telephone Encounter (Signed)
I think this may have been an appt reminder

## 2022-01-23 ENCOUNTER — Other Ambulatory Visit: Payer: Self-pay

## 2022-01-23 DIAGNOSIS — N1831 Chronic kidney disease, stage 3a: Secondary | ICD-10-CM

## 2022-01-24 ENCOUNTER — Inpatient Hospital Stay: Payer: Medicare Other | Attending: Oncology

## 2022-01-24 DIAGNOSIS — G4730 Sleep apnea, unspecified: Secondary | ICD-10-CM | POA: Insufficient documentation

## 2022-01-24 DIAGNOSIS — R5383 Other fatigue: Secondary | ICD-10-CM | POA: Diagnosis not present

## 2022-01-24 DIAGNOSIS — D563 Thalassemia minor: Secondary | ICD-10-CM | POA: Diagnosis not present

## 2022-01-24 DIAGNOSIS — D631 Anemia in chronic kidney disease: Secondary | ICD-10-CM | POA: Diagnosis not present

## 2022-01-24 DIAGNOSIS — N1831 Chronic kidney disease, stage 3a: Secondary | ICD-10-CM | POA: Insufficient documentation

## 2022-01-24 LAB — FERRITIN: Ferritin: 601 ng/mL — ABNORMAL HIGH (ref 11–307)

## 2022-01-24 LAB — CBC WITH DIFFERENTIAL/PLATELET
Abs Immature Granulocytes: 0.05 10*3/uL (ref 0.00–0.07)
Basophils Absolute: 0 10*3/uL (ref 0.0–0.1)
Basophils Relative: 0 %
Eosinophils Absolute: 0.1 10*3/uL (ref 0.0–0.5)
Eosinophils Relative: 3 %
HCT: 38.2 % (ref 36.0–46.0)
Hemoglobin: 11.8 g/dL — ABNORMAL LOW (ref 12.0–15.0)
Immature Granulocytes: 1 %
Lymphocytes Relative: 33 %
Lymphs Abs: 1.9 10*3/uL (ref 0.7–4.0)
MCH: 26.5 pg (ref 26.0–34.0)
MCHC: 30.9 g/dL (ref 30.0–36.0)
MCV: 85.8 fL (ref 80.0–100.0)
Monocytes Absolute: 0.6 10*3/uL (ref 0.1–1.0)
Monocytes Relative: 11 %
Neutro Abs: 3 10*3/uL (ref 1.7–7.7)
Neutrophils Relative %: 52 %
Platelets: 216 10*3/uL (ref 150–400)
RBC: 4.45 MIL/uL (ref 3.87–5.11)
RDW: 14.7 % (ref 11.5–15.5)
WBC: 5.7 10*3/uL (ref 4.0–10.5)
nRBC: 0 % (ref 0.0–0.2)

## 2022-01-24 LAB — IRON AND TIBC
Iron: 77 ug/dL (ref 28–170)
Saturation Ratios: 29 % (ref 10.4–31.8)
TIBC: 262 ug/dL (ref 250–450)
UIBC: 185 ug/dL

## 2022-01-25 ENCOUNTER — Ambulatory Visit
Admission: EM | Admit: 2022-01-25 | Discharge: 2022-01-25 | Disposition: A | Discharge status: Home / Self Care | Payer: Medicare Other | Attending: Emergency Medicine | Admitting: Emergency Medicine

## 2022-01-25 DIAGNOSIS — M5431 Sciatica, right side: Secondary | ICD-10-CM | POA: Diagnosis not present

## 2022-01-25 MED ORDER — PREDNISONE 10 MG (21) PO TBPK: ORAL_TABLET | ORAL | 0 refills | Status: DC

## 2022-01-25 MED ORDER — DEXAMETHASONE SODIUM PHOSPHATE 10 MG/ML IJ SOLN
10.0000 mg | Freq: Once | INTRAMUSCULAR | Status: AC
  Administered 2022-01-25: 10 mg via INTRAMUSCULAR

## 2022-01-25 NOTE — Discharge Instructions (Signed)
Take the prednisone according to the package instructions.  You will take it each morning with breakfast.  Make sure that you are always taking it with food.  Follow the rehabilitation exercises in your discharge paperwork to help you with your sciatic pain.  Return for reevaluation for new or worsening symptoms, or see your primary care provider.

## 2022-01-25 NOTE — ED Triage Notes (Signed)
Pt c/o right leg pain x2days.  Pt believed the pain was arthritis. Pt states that while walking around the house, she suddenly could not put any pressure on it.   Pt states that the pain is down the right leg to the foot.   Pt has a baker cyst in the back of the right knee

## 2022-01-25 NOTE — ED Provider Notes (Signed)
[facility]    CSN: [id number] Arrival date & time: 01/25/22  1152      History   Chief Complaint Chief Complaint  Patient presents with   Leg Pain    HPI Tina Mcdonald is a 81 y.o. female.   HPI  81 year old female here for evaluation of right leg pain.  Patient reports that she has been having pain in her right knee for several days.  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  She recently had a cortisone injection on Oct 31, 2021 in her right knee for severe osteoarthritis by [facility].  She states that when she woke up this morning she was having pain in her entire right leg and states that it hurts to bear weight.  No new injuries or falls.  She states the pain goes through her right buttock to her right hip, down her right leg, and across the top of her foot.  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  Past Medical History:  Diagnosis Date   Anemia    Arthritis    Chronic kidney disease    Colon polyps    Diabetes mellitus without complication (HCC)    Diabetic neuropathy (HCC)    Dyspnea    GERD (gastroesophageal reflux disease)    Hyperlipemia    Hypertension    Osteoarthritis    Radiculopathy    weakness in right leg   Sleep apnea    does not have her cpap anymore   Stress incontinence    Urticaria    Vitamin B 12 deficiency    Wears glasses     Patient Active Problem List   Diagnosis Date Noted   Baker cyst, right 10/23/2021   History of rhabdomyolysis due to statin 10/20/2021   Mitral valve regurgitation 08/18/2021   Anemia due to stage 3 chronic kidney disease ([place]) 07/29/2021   Thalassemia alpha carrier 07/29/2021   Vitamin D deficiency 05/20/2021   LVH (left ventricular hypertrophy) 03/01/2021   History of chronic cough 11/05/2020   Aortic atherosclerosis ([place]) 02/02/2020   Hiatal hernia    OSA (obstructive sleep apnea)  02/24/2017   Urge incontinence 11/24/2016   CKD stage 3 due to type 2 diabetes mellitus ([place]) 02/20/2015   Polyp of sigmoid colon 01/16/2015   B12 deficiency 01/16/2015   Radiculopathy 01/16/2015   Osteoarthritis 01/16/2015   Hypertension associated with diabetes ([place]) 01/16/2015   Hyperlipidemia associated with type 2 diabetes mellitus ([place]) 01/16/2015   Diabetic neuropathy ([place]) 01/16/2015   GERD (gastroesophageal reflux disease) 01/16/2015    Past Surgical History:  Procedure Laterality Date   ABDOMINAL HYSTERECTOMY     APPENDECTOMY     BREAST BIOPSY Right    right-neg   BREAST BIOPSY Left    us/bx/clip-neg   CARPAL TUNNEL RELEASE     right   COLONOSCOPY     COLONOSCOPY WITH PROPOFOL N/A 12/10/2015   Procedure: COLONOSCOPY WITH PROPOFOL;  Surgeon: Lucilla Lame, MD;  Location: [facility];  Service: Endoscopy;  Laterality: N/A;  Diabetic - oral meds Sleep Apnea LATEX allergy   COLONOSCOPY WITH PROPOFOL N/A 01/17/2019   Procedure: COLONOSCOPY WITH PROPOFOL;  Surgeon: Virgel Manifold, MD;  Location: [facility];  Service: Endoscopy;  Laterality: N/A;   CYSTOSCOPY     DILATION AND CURETTAGE OF UTERUS     ESOPHAGOGASTRODUODENOSCOPY (EGD) WITH PROPOFOL N/A 01/17/2019  Procedure: ESOPHAGOGASTRODUODENOSCOPY (EGD) WITH PROPOFOL;  Surgeon: Virgel Manifold, MD;  Location: [facility];  Service: Endoscopy;  Laterality: N/A;   EYE SURGERY Bilateral    lens implant   GIVENS CAPSULE STUDY N/A 05/04/2019   Procedure: GIVENS CAPSULE STUDY;  Surgeon: Virgel Manifold, MD;  Location: [facility];  Service: Endoscopy;  Laterality: N/A;   LUMBAR FUSION  2009   POLYPECTOMY  12/10/2015   Procedure: POLYPECTOMY;  Surgeon: Lucilla Lame, MD;  Location: [facility];  Service: Endoscopy;;   POLYPECTOMY  01/17/2019   Procedure: POLYPECTOMY;  Surgeon: Virgel Manifold, MD;  Location: [facility] [place];  Service: Endoscopy;;   RESECTION DISTAL CLAVICAL  Right 11/25/2013   Procedure: RIGHT SHOULDER OPEN RESECTION DISTAL CLAVICAL EXCISION SOFT TISSUE TUMOR SHOULDER DEEP SUBFASCIAL INTRAMUSCULAR/DEBRIDEMENT/ROTATOR CUFF REPAIR/BICEPS TENODESIS/MANIPULATION;  Surgeon: Renette Butters, MD;  Location: [facility];  Service: Orthopedics;  Laterality: Right;   SHOULDER ARTHROSCOPY WITH ROTATOR CUFF REPAIR AND SUBACROMIAL DECOMPRESSION Right 11/25/2013   Procedure: SHOULDER ARTHROSCOPY WITH DEBRIDEMENT ROTATOR CUFF REPAIR  A;  Surgeon: Renette Butters, MD;  Location: [facility];  Service: Orthopedics;  Laterality: Right;    OB History   No obstetric history on file.      Home Medications    Prior to Admission medications   Medication Sig Start Date End Date Taking? Authorizing Provider  acetaminophen (TYLENOL) 325 MG tablet Take 650 mg by mouth every 6 (six) hours as needed.   Yes [provider]  amLODipine (NORVASC) 10 MG tablet Take 10 mg by mouth every morning. 01/04/21  Yes [provider]  blood glucose meter kit and supplies KIT Dispense based on patient and insurance preference. Use up to four times daily as directed. (FOR ICD-9 250.00, 250.01). 09/22/18  Yes [name], Jolene T, NP  carvedilol (COREG) 12.5 MG tablet Take 12.5 mg by mouth 2 (two) times daily with a meal.   Yes [provider]  chlorthalidone (HYGROTON) 25 MG tablet Take 25 mg by mouth daily. 07/26/21 07/26/22 Yes [provider]  Cholecalciferol (VITAMIN D-1000 MAX ST) 25 MCG (1000 UT) tablet Take by mouth.   Yes [provider]  cholecalciferol (VITAMIN D3) 25 MCG (1000 UNIT) tablet Take 1,000 Units by mouth daily.   Yes [provider]  diclofenac sodium (VOLTAREN) 1 % GEL Apply 2 g topically 2 (two) times daily as needed. 11/17/18  Yes [name], Jolene T, NP  gabapentin (NEURONTIN) 100 MG capsule TAKE 2 CAPSULES BY MOUTH AT BEDTIME Patient taking differently: daily. 05/19/21  Yes [name], Jolene T, NP   glucose blood test strip Check daily. ICD 10 E11.9 01/16/15  Yes Kathrine Haddock, NP  irbesartan (AVAPRO) 300 MG tablet Take 1 tablet (300 mg total) by mouth daily. New blood pressure medicine. 11/20/21  Yes [name], Henrine Screws T, NP  omeprazole (PRILOSEC) 20 MG capsule Take 1 capsule (20 mg total) by mouth 2 (two) times daily before a meal. 11/12/21  Yes [name], Jolene T, NP  predniSONE (STERAPRED UNI-PAK 21 TAB) 10 MG (21) TBPK tablet Take 6 tablets on day 1, 5 tablets day 2, 4 tablets day 3, 3 tablets day 4, 2 tablets day 5, 1 tablet day 6 01/25/22  Yes Margarette Canada, NP  triamcinolone cream (KENALOG) 0.1 % Apply 1 Application topically 2 (two) times daily. 12/19/21  Yes Jon Billings, NP  vitamin C (ASCORBIC ACID) 250 MG tablet Take 250 mg by mouth every other day.   Yes [provider]  vitamin C (ASCORBIC ACID) 250 MG tablet Vitamin C 500 mg tablet   Yes [provider]    Family History Family History  Problem Relation Age of Onset   Heart disease Mother    Diabetes Mother    Hypertension Mother    Heart disease Father    Hypertension Father    Diabetes Sister    Hypertension Sister    Heart disease Sister    Kidney disease Sister    Diabetes Sister    Hypertension Sister    Kidney disease Sister    Non-Hodgkin's lymphoma Maternal Uncle    Breast cancer Cousin 32    Social History Social History   Tobacco Use   Smoking status: Former    Packs/day: 1.00    Years: 15.00    Total pack years: 15.00    Types: Cigarettes    Quit date: 11/22/1997    Years since quitting: 24.1   Smokeless tobacco: Never  Vaping Use   Vaping Use: Never used  Substance Use Topics   Alcohol use: No   Drug use: No     Allergies   Statins, Contrast media [iodinated contrast media], Penicillins, Sulfa antibiotics, Aloe, Latex, Neosporin [neomycin-bacitracin zn-polymyx], and Other   Review of Systems Review of Systems  Musculoskeletal:  Negative for joint swelling.       Pain  down the back and outside of the right leg.  Skin:  Negative for color change.     Physical Exam Triage Vital Signs ED Triage Vitals  Enc Vitals Group     BP 01/25/22 1204 134/71     Pulse Rate 01/25/22 1204 82     Resp 01/25/22 1204 18     Temp 01/25/22 1204 98.4 F (36.9 C)     Temp Source 01/25/22 1204 Oral     SpO2 01/25/22 1204 99 %     Weight 01/25/22 1200 162 lb (73.5 kg)     Height 01/25/22 1200 _0  (1.575 m)     Head Circumference --      Peak Flow --      Pain Score 01/25/22 1200 10     Pain Loc --      Pain Edu? --      Excl. in [place]? --    No data found.  Updated Vital Signs BP 134/71 (BP Location: Left Arm)   Pulse 82   Temp 98.4 F (36.9 C) (Oral)   Resp 18   Ht _1  (1.575 m)   Wt 162 lb (73.5 kg)   LMP  (LMP Unknown)   SpO2 99%   BMI 29.63 kg/m   Visual Acuity Right Eye Distance:   Left Eye Distance:   Bilateral Distance:    Right Eye Near:   Left Eye Near:    Bilateral Near:     Physical Exam Vitals and nursing note reviewed.  Constitutional:      Appearance: Normal appearance. She is not ill-appearing.  HENT:     Head: Normocephalic and atraumatic.  Musculoskeletal:        General: Tenderness present. No swelling, deformity or signs of injury.  Skin:    General: Skin is warm and dry.     Capillary Refill: Capillary refill takes less than 2 seconds.     Findings: No erythema or rash.  Neurological:     General: No focal deficit present.     Mental Status: She is alert and oriented to person, place, and time.  Sensory: No sensory deficit.     Motor: No weakness.  Psychiatric:        Mood and Affect: Mood normal.        Behavior: Behavior normal.        Thought Content: Thought content normal.        Judgment: Judgment normal.      UC Treatments / Results  Labs (all labs ordered are listed, but only abnormal results are displayed) Labs Reviewed - No data to display  EKG   Radiology No results  found.  Procedures Procedures (including critical care time)  Medications Ordered in UC Medications  dexamethasone (DECADRON) injection 10 mg (10 mg Intramuscular Given 01/25/22 1219)    Initial Impression / Assessment and Plan / UC Course  I have reviewed the triage vital signs and the nursing notes.  Pertinent labs & imaging results that were available during my care of the patient were reviewed by me and considered in my medical decision making (see chart for details).  81 year old female here for evaluation of right leg pain that started today.  The patient does appear to be in a moderate degree of pain and states she took Tylenol without significant improvement.  She has been dealing with pain in her right knee from osteoarthritis as well as a Baker's cyst.  She had an injection of cortisone in her right knee in May.  She states that over the last couple days she been having pain in her right leg from the knee down but this morning the pain emanated from her right buttock, down the outside of her right thigh, down to her ankle, and across the top of her foot.  On exam patient's right hip and leg are normal anatomical position.  She is able to bear weight but with pain.  She has full range of motion of her hip, knee, ankle, and foot on the right side.  She has no pain with palpation of her lumbar spine but she does have pain when palpating along the path of the sciatic nerve through the right buttock and down the outside of the right leg.  She also has a positive seated straight leg raise on the right.  Her exam is consistent with sciatica.  I will give her a shot of Decadron in clinic and discharged home on a prednisone pack that she can start tomorrow.  I will also give her home PT exercises she can perform.   Final Clinical Impressions(s) / UC Diagnoses   Final diagnoses:  Sciatica of right side     Discharge Instructions      Take the prednisone according to the package  instructions.  You will take it each morning with breakfast.  Make sure that you are always taking it with food.  Follow the rehabilitation exercises in your discharge paperwork to help you with your sciatic pain.  Return for reevaluation for new or worsening symptoms, or see your primary care provider.      ED Prescriptions     Medication Sig Dispense Auth. Provider   predniSONE (STERAPRED UNI-PAK 21 TAB) 10 MG (21) TBPK tablet Take 6 tablets on day 1, 5 tablets day 2, 4 tablets day 3, 3 tablets day 4, 2 tablets day 5, 1 tablet day 6 21 tablet Margarette Canada, NP      PDMP not reviewed this encounter.   Margarette Canada, NP 01/25/22 1221

## 2022-01-27 ENCOUNTER — Encounter: Payer: Self-pay | Admitting: Oncology

## 2022-01-27 ENCOUNTER — Inpatient Hospital Stay: Payer: Medicare Other | Admitting: Oncology

## 2022-01-27 VITALS — BP 141/64 | HR 77 | Temp 98.7°F | Resp 20 | Wt 168.8 lb

## 2022-01-27 DIAGNOSIS — D563 Thalassemia minor: Secondary | ICD-10-CM | POA: Diagnosis not present

## 2022-01-27 DIAGNOSIS — D631 Anemia in chronic kidney disease: Secondary | ICD-10-CM

## 2022-01-27 DIAGNOSIS — G4730 Sleep apnea, unspecified: Secondary | ICD-10-CM | POA: Diagnosis not present

## 2022-01-27 DIAGNOSIS — N1831 Chronic kidney disease, stage 3a: Secondary | ICD-10-CM | POA: Diagnosis not present

## 2022-01-27 DIAGNOSIS — R5383 Other fatigue: Secondary | ICD-10-CM

## 2022-01-27 NOTE — Progress Notes (Signed)
Hematology/Oncology Progress note [phone] [phone]      Patient Care Team: [name], NP as PCP - General (Nurse Practitioner) Yolonda Kida, MD (Internal Medicine) Vanita Ingles, RN as Case Manager (General Practice)  REFERRING PROVIDER: [name], NP  CHIEF COMPLAINTS/REASON FOR VISIT:   microcytic anemia, anemia secondary to CKD   INTERVAL HISTORY Tina Mcdonald is a 81 y.o. female who has above history reviewed by me today presents for follow up visit for management of anemia Patient reports feeling fatigued recently.  Patient takes Slow Fe once daily.  She does not sleep well.  She has sleep apnea not on CPAP machine due to financial reason.  Review of Systems  Constitutional:  Positive for fatigue. Negative for appetite change, chills and fever.  HENT:   Negative for hearing loss and voice change.   Eyes:  Negative for eye problems.  Respiratory:  Negative for chest tightness and cough.   Cardiovascular:  Negative for chest pain.  Gastrointestinal:  Negative for abdominal distention, abdominal pain and blood in stool.  Endocrine: Negative for hot flashes.  Genitourinary:  Negative for difficulty urinating and frequency.   Musculoskeletal:  Negative for arthralgias.  Skin:  Negative for itching and rash.  Neurological:  Negative for extremity weakness.  Hematological:  Negative for adenopathy.  Psychiatric/Behavioral:  Negative for confusion.      MEDICAL HISTORY:  Past Medical History:  Diagnosis Date   Anemia    Arthritis    Chronic kidney disease    Colon polyps    Diabetes mellitus without complication (HCC)    Diabetic neuropathy (HCC)    Dyspnea    GERD (gastroesophageal reflux disease)    Hyperlipemia    Hypertension    Osteoarthritis    Radiculopathy    weakness in right leg   Sleep apnea    does not have her cpap anymore   Stress incontinence    Urticaria    Vitamin B 12 deficiency    Wears  glasses     SURGICAL HISTORY: Past Surgical History:  Procedure Laterality Date   ABDOMINAL HYSTERECTOMY     APPENDECTOMY     BREAST BIOPSY Right    right-neg   BREAST BIOPSY Left    us/bx/clip-neg   CARPAL TUNNEL RELEASE     right   COLONOSCOPY     COLONOSCOPY WITH PROPOFOL N/A 12/10/2015   Procedure: COLONOSCOPY WITH PROPOFOL;  Surgeon: Lucilla Lame, MD;  Location: [place];  Service: Endoscopy;  Laterality: N/A;  Diabetic - oral meds Sleep Apnea LATEX allergy   COLONOSCOPY WITH PROPOFOL N/A 01/17/2019   Procedure: COLONOSCOPY WITH PROPOFOL;  Surgeon: Virgel Manifold, MD;  Location: [place];  Service: Endoscopy;  Laterality: N/A;   CYSTOSCOPY     DILATION AND CURETTAGE OF UTERUS     ESOPHAGOGASTRODUODENOSCOPY (EGD) WITH PROPOFOL N/A 01/17/2019   Procedure: ESOPHAGOGASTRODUODENOSCOPY (EGD) WITH PROPOFOL;  Surgeon: Virgel Manifold, MD;  Location: [place];  Service: Endoscopy;  Laterality: N/A;   EYE SURGERY Bilateral    lens implant   GIVENS CAPSULE STUDY N/A 05/04/2019   Procedure: GIVENS CAPSULE STUDY;  Surgeon: Virgel Manifold, MD;  Location: [facility];  Service: Endoscopy;  Laterality: N/A;   LUMBAR FUSION  2009   POLYPECTOMY  12/10/2015   Procedure: POLYPECTOMY;  Surgeon: Lucilla Lame, MD;  Location: [place];  Service: Endoscopy;;   POLYPECTOMY  01/17/2019   Procedure: POLYPECTOMY;  Surgeon: Vonda Antigua  B, MD;  Location: [place];  Service: Endoscopy;;   RESECTION DISTAL CLAVICAL Right 11/25/2013   Procedure: RIGHT SHOULDER OPEN RESECTION DISTAL CLAVICAL EXCISION SOFT TISSUE TUMOR SHOULDER DEEP SUBFASCIAL INTRAMUSCULAR/DEBRIDEMENT/ROTATOR CUFF REPAIR/BICEPS TENODESIS/MANIPULATION;  Surgeon: Renette Butters, MD;  Location: [place];  Service: Orthopedics;  Laterality: Right;   SHOULDER ARTHROSCOPY WITH ROTATOR CUFF REPAIR AND SUBACROMIAL DECOMPRESSION Right 11/25/2013   Procedure:  SHOULDER ARTHROSCOPY WITH DEBRIDEMENT ROTATOR CUFF REPAIR  A;  Surgeon: Renette Butters, MD;  Location: [place];  Service: Orthopedics;  Laterality: Right;    SOCIAL HISTORY: Social History   Socioeconomic History   Marital status: Single    Spouse name: Not on file   Number of children: Not on file   Years of education: Not on file   Highest education level: Some college, no degree  Occupational History   Occupation: retired  Tobacco Use   Smoking status: Former    Packs/day: 1.00    Years: 15.00    Total pack years: 15.00    Types: Cigarettes    Quit date: 11/22/1997    Years since quitting: 24.1   Smokeless tobacco: Never  Vaping Use   Vaping Use: Never used  Substance and Sexual Activity   Alcohol use: No   Drug use: No   Sexual activity: Not Currently  Other Topics Concern   Not on file  Social History Narrative   Not on file   Social Determinants of Health   Financial Resource Strain: Medium Risk (03/29/2021)   Overall Financial Resource Strain (CARDIA)    Difficulty of Paying Living Expenses: Somewhat hard  Food Insecurity: No Food Insecurity (03/29/2021)   Hunger Vital Sign    Worried About Running Out of Food in the Last Year: Never true    Ran Out of Food in the Last Year: Never true  Transportation Needs: No Transportation Needs (03/29/2021)   PRAPARE - Hydrologist (Medical): No    Lack of Transportation (Non-Medical): No  Physical Activity: Inactive (03/29/2021)   Exercise Vital Sign    Days of Exercise per Week: 0 days    Minutes of Exercise per Session: 0 min  Stress: No Stress Concern Present (03/29/2021)   [place]    Feeling of Stress : Not at all  Social Connections: Unknown (01/25/2020)   Social Connection and Isolation Panel [NHANES]    Frequency of Communication with Friends and Family: More than three times a week    Frequency of  Social Gatherings with Friends and Family: More than three times a week    Attends Religious Services: More than 4 times per year    Active Member of [employer] or Organizations: Yes    Attends Archivist Meetings: More than 4 times per year    Marital Status: Not on file  Intimate Partner Violence: Not At Risk (01/25/2020)   Humiliation, Afraid, Rape, and Kick questionnaire    Fear of Current or Ex-Partner: No    Emotionally Abused: No    Physically Abused: No    Sexually Abused: No    FAMILY HISTORY: Family History  Problem Relation Age of Onset   Heart disease Mother    Diabetes Mother    Hypertension Mother    Heart disease Father    Hypertension Father    Diabetes Sister    Hypertension Sister    Heart disease Sister  Kidney disease Sister    Diabetes Sister    Hypertension Sister    Kidney disease Sister    Non-Hodgkin's lymphoma Maternal Uncle    Breast cancer Cousin 67    ALLERGIES:  is allergic to statins, contrast media [iodinated contrast media], penicillins, sulfa antibiotics, aloe, latex, neosporin [neomycin-bacitracin zn-polymyx], and other.  MEDICATIONS:  Current Outpatient Medications  Medication Sig Dispense Refill   acetaminophen (TYLENOL) 325 MG tablet Take 650 mg by mouth every 6 (six) hours as needed.     amLODipine (NORVASC) 10 MG tablet Take 10 mg by mouth every morning.     blood glucose meter kit and supplies KIT Dispense based on patient and insurance preference. Use up to four times daily as directed. (FOR ICD-9 250.00, 250.01). 1 each 0   carvedilol (COREG) 12.5 MG tablet Take 12.5 mg by mouth 2 (two) times daily with a meal.     chlorthalidone (HYGROTON) 25 MG tablet Take 25 mg by mouth daily.     Cholecalciferol (VITAMIN D-1000 MAX ST) 25 MCG (1000 UT) tablet Take by mouth.     cholecalciferol (VITAMIN D3) 25 MCG (1000 UNIT) tablet Take 1,000 Units by mouth daily.     diclofenac sodium (VOLTAREN) 1 % GEL Apply 2 g topically 2 (two) times  daily as needed. 1 Tube 2   doxycycline (MONODOX) 100 MG capsule Take 100 mg by mouth 2 (two) times daily.     gabapentin (NEURONTIN) 100 MG capsule TAKE 2 CAPSULES BY MOUTH AT BEDTIME (Patient taking differently: daily.) 180 capsule 3   glucose blood test strip Check daily. ICD 10 E11.9 100 each 12   irbesartan (AVAPRO) 300 MG tablet Take 1 tablet (300 mg total) by mouth daily. New blood pressure medicine. 90 tablet 4   omeprazole (PRILOSEC) 20 MG capsule Take 1 capsule (20 mg total) by mouth 2 (two) times daily before a meal. 180 capsule 4   predniSONE (STERAPRED UNI-PAK 21 TAB) 10 MG (21) TBPK tablet Take 6 tablets on day 1, 5 tablets day 2, 4 tablets day 3, 3 tablets day 4, 2 tablets day 5, 1 tablet day 6 21 tablet 0   vitamin C (ASCORBIC ACID) 250 MG tablet Take 250 mg by mouth every other day.     vitamin C (ASCORBIC ACID) 250 MG tablet Vitamin C 500 mg tablet     triamcinolone cream (KENALOG) 0.1 % Apply 1 Application topically 2 (two) times daily. (Patient not taking: Reported on 01/27/2022) 30 g 0   No current facility-administered medications for this visit.     PHYSICAL EXAMINATION: ECOG PERFORMANCE STATUS: 1 - Symptomatic but completely ambulatory Vitals:   01/27/22 1321  BP: (!) 141/64  Pulse: 77  Resp: 20  Temp: 98.7 F (37.1 C)  SpO2: 100%   Filed Weights   01/27/22 1321  Weight: 168 lb 12.8 oz (76.6 kg)    Physical Exam Constitutional:      General: She is not in acute distress. HENT:     Head: Normocephalic and atraumatic.  Eyes:     General: No scleral icterus.    Pupils: Pupils are equal, round, and reactive to light.  Cardiovascular:     Rate and Rhythm: Normal rate and regular rhythm.  Pulmonary:     Effort: Pulmonary effort is normal. No respiratory distress.     Breath sounds: No wheezing.  Abdominal:     General: Bowel sounds are normal. There is no distension.     Palpations: Abdomen is  soft.  Musculoskeletal:        General: No deformity. Normal  range of motion.     Cervical back: Normal range of motion and neck supple.  Skin:    General: Skin is warm and dry.     Findings: No erythema or rash.  Neurological:     Mental Status: She is alert and oriented to person, place, and time.     Cranial Nerves: No cranial nerve deficit.     Coordination: Coordination normal.  Psychiatric:        Mood and Affect: Mood normal.      LABORATORY DATA:  I have reviewed the data as listed Lab Results  Component Value Date   WBC 5.7 01/24/2022   HGB 11.8 (L) 01/24/2022   HCT 38.2 01/24/2022   MCV 85.8 01/24/2022   PLT 216 01/24/2022   Recent Labs    03/01/21 1003 05/21/21 1216 07/26/21 1017  NA 143 144 138  K 4.2 4.3 4.0  CL 103 103 103  CO2 '25 26 25  ' GLUCOSE 88 87 107*  BUN 18 17 25*  CREATININE 1.02* 1.01* 1.03*  CALCIUM 9.7 9.6 9.6  GFRNONAA  --   --  55*  PROT  --  6.8 7.3  ALBUMIN  --  4.1 4.0  AST  --  15 16  ALT  --  9 12  ALKPHOS  --  68 55  BILITOT  --  0.2 0.3    Iron/TIBC/Ferritin/ %Sat    Component Value Date/Time   IRON 77 01/24/2022 1120   IRON 62 12/02/2019 1110   TIBC 262 01/24/2022 1120   TIBC 253 12/02/2019 1110   FERRITIN 601 (H) 01/24/2022 1120   FERRITIN 134 12/02/2019 1110   IRONPCTSAT 29 01/24/2022 1120   IRONPCTSAT 25 12/02/2019 1110        ASSESSMENT & PLAN:  1. Anemia due to stage 3a chronic kidney disease ([place])   2. Thalassemia alpha carrier   3. Other fatigue    #Chronic anemia due to chronic kidney disease labs reviewed and discussed with patient Hemoglobin is 11.8, iron panel showed saturation of 29, ferritin of 601. I recommend patient to further cut back on iron supplementation.  She may stop Slow Fe completely   #Fatigue, Not explained by her mild level of anemia.  Probably secondary to untreated sleep apnea.  Recommend patient to further discuss with primary care provider.  Thalassemia carrier, no intervention at this point. .     Orders Placed This Encounter   Procedures   CBC with Differential/Platelet    Standing Status:   Future    Standing Expiration Date:   01/27/2023   Iron and TIBC([facility])   Ferritin    Standing Status:   Future    Standing Expiration Date:   01/28/2023   Comprehensive metabolic panel    Standing Status:   Future    Standing Expiration Date:   01/27/2023    All questions were answered. The patient knows to call the clinic with any problems questions or concerns.  Follow-up in 12 months Earlie Server, MD, PhD  01/27/2022

## 2022-01-29 ENCOUNTER — Encounter: Payer: Self-pay | Admitting: Nurse Practitioner

## 2022-01-29 ENCOUNTER — Ambulatory Visit (INDEPENDENT_AMBULATORY_CARE_PROVIDER_SITE_OTHER): Payer: Medicare Other | Admitting: Nurse Practitioner

## 2022-01-29 DIAGNOSIS — M5431 Sciatica, right side: Secondary | ICD-10-CM

## 2022-01-29 MED ORDER — TIZANIDINE HCL 2 MG PO TABS
2.0000 mg | ORAL_TABLET | Freq: Three times a day (TID) | ORAL | 0 refills | Status: DC | PRN
Start: 2022-01-29 — End: 2022-02-05

## 2022-01-29 NOTE — Progress Notes (Signed)
BP 122/74   Pulse 88   Temp 98.6 F (37 C) (Oral)   Ht 5' 2.01" (1.575 m)   Wt 170 lb 12.8 oz (77.5 kg)   LMP  (LMP Unknown)   SpO2 98%   BMI 31.23 kg/m    Subjective:    Patient ID: [name], female    DOB: Jan 10, 1941, 81 y.o.   MRN: [id number]  HPI: MAZZY SANTARELLI is a 81 y.o. female  Chief Complaint  Patient presents with   Back Pain    Radiating down right leg, started on Friday. Went to UC on Saturday, Patient states the right calf is still and real tight and very uncomfortable.    BACK PAIN Having sciatic pain that started on Friday, went to urgent care Saturday.  Still having tightness in behind right leg and calf.  She was provided Prednisone pack and shot in UC.  Was babysitting on Friday.  Has history of back surgery in 1999.  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  Taking 200 MG Gabapentin very night. Duration: days Mechanism of injury: unknown Location: Right Onset: sudden Severity: mild, tightness Quality: dull and aching Frequency: intermittent Radiation: R leg below the knee Aggravating factors: movement and bending Alleviating factors: Prednisone, Voltaren  Status: stable -- improving Treatments attempted: Prednisone, Voltaren, Tylenol  Relief with NSAIDs?: No NSAIDs Taken Nighttime pain:  no Paresthesias / decreased sensation:  no Bowel / bladder incontinence:  no Fevers:  no Dysuria / urinary frequency:  no  CrCl 46 at this time based on recent labs from [facility].  Relevant past medical, surgical, family and social history reviewed and updated as indicated. Interim medical history since our last visit reviewed. Allergies and medications reviewed and updated.  Review of Systems  Constitutional:  Negative for activity change, appetite change, diaphoresis, fatigue and fever.  HENT: Negative.    Respiratory:  Negative for cough, chest  tightness, shortness of breath and wheezing.   Cardiovascular:  Negative for chest pain, palpitations and leg swelling.  Endocrine: Negative for polydipsia.  Musculoskeletal:  Positive for back pain.  Neurological: Negative.   Psychiatric/Behavioral: Negative.      Per HPI unless specifically indicated above     Objective:    BP 122/74   Pulse 88   Temp 98.6 F (37 C) (Oral)   Ht 5' 2.01" (1.575 m)   Wt 170 lb 12.8 oz (77.5 kg)   LMP  (LMP Unknown)   SpO2 98%   BMI 31.23 kg/m   Wt Readings from Last 3 Encounters:  01/29/22 170 lb 12.8 oz (77.5 kg)  01/27/22 168 lb 12.8 oz (76.6 kg)  01/25/22 162 lb (73.5 kg)    Physical Exam Vitals and nursing note reviewed.  Constitutional:      General: She is awake. She is not in acute distress.    Appearance: She is well-developed, well-groomed and overweight. She is not ill-appearing.  HENT:     Head: Normocephalic.     Right Ear: Hearing normal.     Left Ear: Hearing normal.  Eyes:     General: Lids are normal.        Right eye: No discharge.        Left eye: No discharge.     Conjunctiva/sclera: Conjunctivae normal.     Pupils: Pupils are equal, round, and reactive to light.  Neck:     Thyroid:  No thyromegaly.     Vascular: No carotid bruit.  Cardiovascular:     Rate and Rhythm: Normal rate and regular rhythm.     Heart sounds: Murmur heard.     Systolic murmur is present with a grade of 2/6.     No gallop.  Pulmonary:     Effort: Pulmonary effort is normal. No accessory muscle usage or respiratory distress.     Breath sounds: Normal breath sounds.  Abdominal:     General: Bowel sounds are normal.     Palpations: Abdomen is soft. There is no hepatomegaly or splenomegaly.     Tenderness: There is no abdominal tenderness.  Musculoskeletal:     Cervical back: Normal range of motion and neck supple.     Lumbar back: Tenderness present. No swelling, spasms or bony tenderness. Decreased range of motion (flexion and  extension). Positive right straight leg raise test. Negative left straight leg raise test.     Right lower leg: No edema.     Left lower leg: No edema.     Comments: Antalgic gait present.  Lymphadenopathy:     Cervical: No cervical adenopathy.  Skin:    General: Skin is warm and dry.  Neurological:     Mental Status: She is alert and oriented to person, place, and time.     Deep Tendon Reflexes: Reflexes are normal and symmetric.     Reflex Scores:      Patellar reflexes are 2+ on the right side and 2+ on the left side. Psychiatric:        Attention and Perception: Attention normal.        Mood and Affect: Mood normal.        Speech: Speech normal.        Behavior: Behavior normal. Behavior is cooperative.        Thought Content: Thought content normal.     Results for orders placed or performed in visit on 01/24/22  Iron and TIBC  Result Value Ref Range   Iron 77 28 - 170 ug/dL   TIBC 262 250 - 450 ug/dL   Saturation Ratios 29 10.4 - 31.8 %   UIBC 185 ug/dL  CBC with Differential/Platelet  Result Value Ref Range   WBC 5.7 4.0 - 10.5 K/uL   RBC 4.45 3.87 - 5.11 MIL/uL   Hemoglobin 11.8 (L) 12.0 - 15.0 g/dL   HCT 38.2 36.0 - 46.0 %   MCV 85.8 80.0 - 100.0 fL   MCH 26.5 26.0 - 34.0 pg   MCHC 30.9 30.0 - 36.0 g/dL   RDW 14.7 11.5 - 15.5 %   Platelets 216 150 - 400 K/uL   nRBC 0.0 0.0 - 0.2 %   Neutrophils Relative % 52 %   Neutro Abs 3.0 1.7 - 7.7 K/uL   Lymphocytes Relative 33 %   Lymphs Abs 1.9 0.7 - 4.0 K/uL   Monocytes Relative 11 %   Monocytes Absolute 0.6 0.1 - 1.0 K/uL   Eosinophils Relative 3 %   Eosinophils Absolute 0.1 0.0 - 0.5 K/uL   Basophils Relative 0 %   Basophils Absolute 0.0 0.0 - 0.1 K/uL   Immature Granulocytes 1 %   Abs Immature Granulocytes 0.05 0.00 - 0.07 K/uL  Ferritin  Result Value Ref Range   Ferritin 601 (H) 11 - 307 ng/mL      Assessment & Plan:   Problem List Items Addressed This Visit       Nervous and Auditory  Sciatic  nerve pain, right    Acute flare for the past 4 days, recommend she complete Prednisone taper as provided by UC.  No red flags at this time. Will send in Tizanidine for her to use as needed, instructed her on this.  May increase her Gabapentin to 300 MG QHS (CrCl 46 at present = max daily dose 900 MG).  Will reduce back to 200 MG in future if pain improved.  Recommend she obtain TENS machine and continue at home simple treatment regimen.  If ongoing or worsening pain alert provider, as may need referral to physiatry due to past surgery and imaging noting degenerative changes in 2020.      Relevant Medications   tiZANidine (ZANAFLEX) 2 MG tablet     Follow up plan: Return for as scheduled.

## 2022-01-29 NOTE — Assessment & Plan Note (Signed)
Acute flare for the past 4 days, recommend she complete Prednisone taper as provided by UC.  No red flags at this time. Will send in Tizanidine for her to use as needed, instructed her on this.  May increase her Gabapentin to 300 MG QHS (CrCl 46 at present = max daily dose 900 MG).  Will reduce back to 200 MG in future if pain improved.  Recommend she obtain TENS machine and continue at home simple treatment regimen.  If ongoing or worsening pain alert provider, as may need referral to physiatry due to past surgery and imaging noting degenerative changes in 2020.

## 2022-01-29 NOTE — Patient Instructions (Signed)
TENS MACHINE AT [facility]  Acute Back Pain, Adult Acute back pain is sudden and usually short-lived. It is often caused by an injury to the muscles and tissues in the back. The injury may result from: A muscle, tendon, or ligament getting overstretched or torn. Ligaments are tissues that connect bones to each other. Lifting something improperly can cause a back strain. Wear and tear (degeneration) of the spinal disks. Spinal disks are circular tissue that provide cushioning between the bones of the spine (vertebrae). Twisting motions, such as while playing sports or doing yard work. A hit to the back. Arthritis. You may have a physical exam, lab tests, and imaging tests to find the cause of your pain. Acute back pain usually goes away with rest and home care. Follow these instructions at home: Managing pain, stiffness, and swelling Take over-the-counter and prescription medicines only as told by your health care provider. Treatment may include medicines for pain and inflammation that are taken by mouth or applied to the skin, or muscle relaxants. Your health care provider may recommend applying ice during the first 24-48 hours after your pain starts. To do this: Put ice in a plastic bag. Place a towel between your skin and the bag. Leave the ice on for 20 minutes, 2-3 times a day. Remove the ice if your skin turns bright red. This is very important. If you cannot feel pain, heat, or cold, you have a greater risk of damage to the area. If directed, apply heat to the affected area as often as told by your health care provider. Use the heat source that your health care provider recommends, such as a moist heat pack or a heating pad. Place a towel between your skin and the heat source. Leave the heat on for 20-30 minutes. Remove the heat if your skin turns bright red. This is especially important if you are unable to feel pain, heat, or cold. You have a greater risk of getting burned. Activity  Do  not stay in bed. Staying in bed for more than 1-2 days can delay your recovery. Sit up and stand up straight. Avoid leaning forward when you sit or hunching over when you stand. If you work at a desk, sit close to it so you do not need to lean over. Keep your chin tucked in. Keep your neck drawn back, and keep your elbows bent at a 90-degree angle (right angle). Sit high and close to the steering wheel when you drive. Add lower back (lumbar) support to your car seat, if needed. Take short walks on even surfaces as soon as you are able. Try to increase the length of time you walk each day. Do not sit, drive, or stand in one place for more than 30 minutes at a time. Sitting or standing for long periods of time can put stress on your back. Do not drive or use heavy machinery while taking prescription pain medicine. Use proper lifting techniques. When you bend and lift, use positions that put less stress on your back: [place] your knees. Keep the load close to your body. Avoid twisting. Exercise regularly as told by your health care provider. Exercising helps your back heal faster and helps prevent back injuries by keeping muscles strong and flexible. Work with a physical therapist to make a safe exercise program, as recommended by your health care provider. Do any exercises as told by your physical therapist. Lifestyle Maintain a healthy weight. Extra weight puts stress on your back and makes  it difficult to have good posture. Avoid activities or situations that make you feel anxious or stressed. Stress and anxiety increase muscle tension and can make back pain worse. Learn ways to manage anxiety and stress, such as through exercise. General instructions Sleep on a firm mattress in a comfortable position. Try lying on your side with your knees slightly bent. If you lie on your back, put a pillow under your knees. Keep your head and neck in a straight line with your spine (neutral position) when using  electronic equipment like smartphones or pads. To do this: Raise your smartphone or pad to look at it instead of bending your head or neck to look down. Put the smartphone or pad at the level of your face while looking at the screen. Follow your treatment plan as told by your health care provider. This may include: Cognitive or behavioral therapy. Acupuncture or massage therapy. Meditation or yoga. Contact a health care provider if: You have pain that is not relieved with rest or medicine. You have increasing pain going down into your legs or buttocks. Your pain does not improve after 2 weeks. You have pain at night. You lose weight without trying. You have a fever or chills. You develop nausea or vomiting. You develop abdominal pain. Get help right away if: You develop new bowel or bladder control problems. You have unusual weakness or numbness in your arms or legs. You feel faint. These symptoms may represent a serious problem that is an emergency. Do not wait to see if the symptoms will go away. Get medical help right away. Call your local emergency services (911 in the [place] Do not drive yourself to the hospital. Summary Acute back pain is sudden and usually short-lived. Use proper lifting techniques. When you bend and lift, use positions that put less stress on your back. Take over-the-counter and prescription medicines only as told by your health care provider, and apply heat or ice as told. This information is not intended to replace advice given to you by your health care provider. Make sure you discuss any questions you have with your health care provider. Document Revised: 08/31/2020 Document Reviewed: 08/31/2020 Elsevier Patient Education  Rafael Hernandez.

## 2022-02-04 ENCOUNTER — Ambulatory Visit: Payer: Self-pay

## 2022-02-04 NOTE — Telephone Encounter (Signed)
Pt scheduled tomorrow with Jolene. Let her know Jolene's recommendations if things worsen overnight.

## 2022-02-04 NOTE — Telephone Encounter (Signed)
  Chief Complaint: Right foot and ankle swollen - Has sob when stabbing pain goes throgh knee Symptoms: ibid Frequency: Saturday Pertinent Negatives: Patient denies Chest pain,  Disposition: '[x]'$ ED /'[]'$ Urgent Care (no appt availability in office) / '[]'$ Appointment(In office/virtual)/ '[]'$  [place] [facility]/ '[]'$ Home Care/ '[]'$ Refused Recommended Disposition /'[]'$ [place] [facility]/ '[]'$  Follow-up with PCP Additional Notes: PT states that she had foot and ankle swelling which went down some,  but did not totally resolve. Swelling is now back. PT reports SOB when stabbing pain comes through her knee.   Reason for Disposition  [1] Thigh, calf, or ankle swelling AND [2] only 1 side  Answer Assessment - Initial Assessment Questions 1. LOCATION: "Which ankle is swollen?" "Where is the swelling?"     Rt foot up to calf. 2. ONSET: "When did the swelling start?"     Saturday - went some and now back 3. SWELLING: "How bad is the swelling?" Or, "How large is it?" (e.g., mild, moderate, severe; size of localized swelling)    - NONE: No joint swelling.   - LOCALIZED: Localized; small area of puffy or swollen skin (e.g., insect bite, skin irritation).   - MILD: Joint looks or feels mildly swollen or puffy.   - MODERATE: Swollen; interferes with normal activities (e.g., work or school); decreased range of movement; may be limping.   - SEVERE: Very swollen; can't move swollen joint at all; limping a lot or unable to walk.     Moderate to severe 4. PAIN: "Is there any pain?" If Yes, ask: "How bad is it?" (Scale 1-10; or mild, moderate, severe)   - NONE (0): no pain.   - MILD (1-3): doesn't interfere with normal activities.    - MODERATE (4-7): interferes with normal activities (e.g., work or school) or awakens from sleep, limping.    - SEVERE (8-10): excruciating pain, unable to do any normal activities, unable to walk.      No - Pain in knee every now and agin. 5. CAUSE: "What do you think caused the ankle  swelling?"     Unsure 6. OTHER SYMPTOMS: "Do you have any other symptoms?" (e.g., fever, chest pain, difficulty breathing, calf pain)     A little SOB 7. PREGNANCY: "Is there any chance you are pregnant?" "When was your last menstrual period?"     na  Protocols used: Ankle Swelling-A-AH

## 2022-02-05 ENCOUNTER — Ambulatory Visit (INDEPENDENT_AMBULATORY_CARE_PROVIDER_SITE_OTHER): Payer: Medicare Other | Admitting: Nurse Practitioner

## 2022-02-05 ENCOUNTER — Encounter: Payer: Self-pay | Admitting: Nurse Practitioner

## 2022-02-05 VITALS — BP 120/71 | HR 85 | Temp 98.7°F | Ht 62.01 in | Wt 169.3 lb

## 2022-02-05 DIAGNOSIS — M7121 Synovial cyst of popliteal space [Baker], right knee: Secondary | ICD-10-CM

## 2022-02-05 MED ORDER — HYDROCODONE-ACETAMINOPHEN 5-325 MG PO TABS: 1.0000 | ORAL_TABLET | Freq: Four times a day (QID) | ORAL | 0 refills | Status: DC | PRN

## 2022-02-05 MED ORDER — ALBUTEROL SULFATE HFA 108 (90 BASE) MCG/ACT IN AERS: 2.0000 | INHALATION_SPRAY | Freq: Four times a day (QID) | RESPIRATORY_TRACT | 1 refills | Status: DC | PRN

## 2022-02-05 NOTE — Progress Notes (Signed)
BP 120/71   Pulse 85   Temp 98.7 F (37.1 C) (Oral)   Ht 5' 2.01" (1.575 m)   Wt 169 lb 4.8 oz (76.8 kg)   LMP  (LMP Unknown)   SpO2 98%   BMI 30.96 kg/m    Subjective:    Patient ID: Tina Mcdonald, female    DOB: 1940-09-13, 81 y.o.   MRN: [id number]  HPI: Tina Mcdonald is a 81 y.o. female  Chief Complaint  Patient presents with   Foot Swelling    Right foot swelling with ankle pain since last thursday   Needs refill on Albuterol, has occasional wheezing with the weather.  KNEE PAIN Has Baker's Cyst behind right knee, currently having issues with this -- similar to past issues.  Noticed this initially on Monday.  Her foot began to swell on Monday, has gone down some since then and knee continues to hurt.  Diagnosed with this last November 2022 and saw ortho who recommended monitoring at that time and to return for worsening. Duration: days Involved knee: right Mechanism of injury:  Baker's Cyst Location:diffuse Onset: sudden Severity: 10/10  Quality:  sharp, shooting, and throbbing Frequency: intermittent -- constant at night Radiation: yes -- nerve shooting pain down leg Aggravating factors: weight bearing, bending, and movement  Alleviating factors: nothing  Status: fluctuating Treatments attempted: ice and heat , muscle relaxer, Gabapentin Relief with NSAIDs?:  No NSAIDs Taken Weakness with weight bearing or walking: no Sensation of giving way: no Locking: no Popping: no Bruising: no Swelling: yes Redness: no Paresthesias/decreased sensation: no Fevers: no   Relevant past medical, surgical, family and social history reviewed and updated as indicated. Interim medical history since our last visit reviewed. Allergies and medications reviewed and updated.  Review of Systems  Constitutional:  Negative for activity change, appetite change, diaphoresis, fatigue and fever.  Respiratory:  Negative for cough, chest tightness, shortness of breath and wheezing.    Cardiovascular:  Positive for leg swelling. Negative for chest pain and palpitations.  Gastrointestinal: Negative.   Musculoskeletal:  Positive for arthralgias.  Neurological: Negative.   Psychiatric/Behavioral: Negative.      Per HPI unless specifically indicated above     Objective:    BP 120/71   Pulse 85   Temp 98.7 F (37.1 C) (Oral)   Ht 5' 2.01" (1.575 m)   Wt 169 lb 4.8 oz (76.8 kg)   LMP  (LMP Unknown)   SpO2 98%   BMI 30.96 kg/m   Wt Readings from Last 3 Encounters:  02/05/22 169 lb 4.8 oz (76.8 kg)  01/29/22 170 lb 12.8 oz (77.5 kg)  01/27/22 168 lb 12.8 oz (76.6 kg)    Physical Exam Vitals and nursing note reviewed.  Constitutional:      General: She is awake. She is not in acute distress.    Appearance: She is well-developed, well-groomed and overweight. She is not ill-appearing.  HENT:     Head: Normocephalic.     Right Ear: Hearing normal.     Left Ear: Hearing normal.  Eyes:     General: Lids are normal.        Right eye: No discharge.        Left eye: No discharge.     Conjunctiva/sclera: Conjunctivae normal.     Pupils: Pupils are equal, round, and reactive to light.  Neck:     Thyroid: No thyromegaly.     Vascular: No carotid bruit.  Cardiovascular:  Rate and Rhythm: Normal rate and regular rhythm.     Pulses:          Popliteal pulses are 2+ on the right side and 2+ on the left side.       Dorsalis pedis pulses are 2+ on the right side and 2+ on the left side.       Posterior tibial pulses are 2+ on the right side and 2+ on the left side.     Heart sounds: Murmur heard.     Systolic murmur is present with a grade of 2/6.     No gallop.     Comments: Negative Homans sign bilaterally.  Pulmonary:     Effort: Pulmonary effort is normal. No accessory muscle usage or respiratory distress.     Breath sounds: Normal breath sounds.  Abdominal:     General: Bowel sounds are normal.     Palpations: Abdomen is soft. There is no hepatomegaly  or splenomegaly.  Musculoskeletal:     Cervical back: Normal range of motion and neck supple.     Right knee: Swelling and crepitus present. No erythema or bony tenderness. Decreased range of motion. Normal pulse.     Left knee: Normal.     Right lower leg: 1+ Edema present.     Left lower leg: Edema (trace) present.     Comments: Baker's Cyst palpated posterior right knee, increased in size since last assessment of area with provider.  Tenderness to area on palpation and to knee.  Lymphadenopathy:     Cervical: No cervical adenopathy.  Skin:    General: Skin is warm and dry.  Neurological:     Mental Status: She is alert and oriented to person, place, and time.  Psychiatric:        Attention and Perception: Attention normal.        Mood and Affect: Mood normal.        Speech: Speech normal.        Behavior: Behavior normal. Behavior is cooperative.        Thought Content: Thought content normal.     Results for orders placed or performed in visit on 01/24/22  Iron and TIBC  Result Value Ref Range   Iron 77 28 - 170 ug/dL   TIBC 262 250 - 450 ug/dL   Saturation Ratios 29 10.4 - 31.8 %   UIBC 185 ug/dL  CBC with Differential/Platelet  Result Value Ref Range   WBC 5.7 4.0 - 10.5 K/uL   RBC 4.45 3.87 - 5.11 MIL/uL   Hemoglobin 11.8 (L) 12.0 - 15.0 g/dL   HCT 38.2 36.0 - 46.0 %   MCV 85.8 80.0 - 100.0 fL   MCH 26.5 26.0 - 34.0 pg   MCHC 30.9 30.0 - 36.0 g/dL   RDW 14.7 11.5 - 15.5 %   Platelets 216 150 - 400 K/uL   nRBC 0.0 0.0 - 0.2 %   Neutrophils Relative % 52 %   Neutro Abs 3.0 1.7 - 7.7 K/uL   Lymphocytes Relative 33 %   Lymphs Abs 1.9 0.7 - 4.0 K/uL   Monocytes Relative 11 %   Monocytes Absolute 0.6 0.1 - 1.0 K/uL   Eosinophils Relative 3 %   Eosinophils Absolute 0.1 0.0 - 0.5 K/uL   Basophils Relative 0 %   Basophils Absolute 0.0 0.0 - 0.1 K/uL   Immature Granulocytes 1 %   Abs Immature Granulocytes 0.05 0.00 - 0.07 K/uL  Ferritin  Result Value  Ref Range    Ferritin 601 (H) 11 - 307 ng/mL      Assessment & Plan:   Problem List Items Addressed This Visit       Musculoskeletal and Integument   Baker cyst, right - Primary    Acute pain presenting and increase in size to area.  At this time recommend she rest and elevate, apply ice as needed + Voltaren gel.  Urgent referral to ortho, ?need for drainage and injection -- she reports they had stated if worsening this would be considered.  Short burst of Norco provided to assist with night time pain -- advised her not to take at same time as Gabapentin and no Tylenol needed with this.        Relevant Orders   Ambulatory referral to Orthopedics     Follow up plan: Return if symptoms worsen or fail to improve.

## 2022-02-05 NOTE — Assessment & Plan Note (Signed)
Acute pain presenting and increase in size to area.  At this time recommend she rest and elevate, apply ice as needed + Voltaren gel.  Urgent referral to ortho, ?need for drainage and injection -- she reports they had stated if worsening this would be considered.  Short burst of Norco provided to assist with night time pain -- advised her not to take at same time as Gabapentin and no Tylenol needed with this.

## 2022-02-05 NOTE — Patient Instructions (Signed)
Baker Cyst  A Baker cyst, also called a popliteal cyst, is a growth that forms at the back of the knee. The cyst forms when the fluid-filled sac (bursa) that cushions the knee joint becomes enlarged. What are the causes? In most cases, a Baker cyst results from another knee problem that causes swelling inside the knee. This makes the fluid inside the knee joint (synovial fluid) flow into the bursa behind the knee, causing the bursa to enlarge. What increases the risk? You may be more likely to develop a Baker cyst if you already have a knee problem, such as: A tear in cartilage that cushions the knee joint (meniscal tear). A tear in the tissues that connect the bones of the knee joint (ligament tear). Knee swelling from osteoarthritis, rheumatoid arthritis, or gout. What are the signs or symptoms? The main symptom of this condition is a lump behind the knee. This may be the only symptom of the condition. The lump may be painful, especially when the knee is straightened. If the lump is painful, the pain may come and go. The knee may also be stiff. Symptoms may quickly get more severe if the cyst breaks open (ruptures). If the cyst ruptures, you may feel the following in your knee and calf: Sudden or worsening pain. Swelling. Bruising. Redness in the calf. A Baker cyst does not always cause symptoms. How is this diagnosed? This condition may be diagnosed based on your symptoms and medical history. Your health care provider will also do a physical exam. This may include: Feeling the cyst to check whether it is tender. Checking your knee for signs of another knee condition that causes swelling. You may have imaging tests, such as: X-rays. MRI. Ultrasound. How is this treated? A Baker cyst that is not painful may go away without treatment. If the cyst gets large or painful, it will likely get better if the underlying knee problem is treated. If needed, treatment for a Baker cyst may  include: Resting. Keeping weight off of the knee. This means not leaning on the knee to support your body weight. Taking NSAIDs, such as ibuprofen, to reduce pain and swelling. Having a procedure to drain the fluid from the cyst with a needle (aspiration). You may also get an injection of a medicine that reduces swelling (steroid). Having surgery. This may be needed if other treatments do not work. This usually involves correcting knee damage and removing the cyst. Follow these instructions at home:  Activity Rest as told by your health care provider. Avoid activities that make pain or swelling worse. Return to your normal activities as told by your health care provider. Ask your health care provider what activities are safe for you. Do not use the injured limb to support your body weight until your health care provider says that you can. Use crutches as told by your health care provider. General instructions Take over-the-counter and prescription medicines only as told by your health care provider. Keep all follow-up visits as told by your health care provider. This is important. Contact a health care provider if: You have knee pain, stiffness, or swelling that does not get better. Get help right away if: You have sudden or worsening pain and swelling in your calf area. Summary A Baker cyst, also called a popliteal cyst, is a growth that forms at the back of the knee. In most cases, a Baker cyst results from another knee problem that causes swelling inside the knee. A Baker cyst that is   not painful may go away without treatment. If needed, treatment for a Baker cyst may include resting, keeping weight off of the knee, medicines, or draining fluid from the cyst. Surgery may be needed if other treatments are not effective. This information is not intended to replace advice given to you by your health care provider. Make sure you discuss any questions you have with your health care  provider. Document Revised: 10/22/2018 Document Reviewed: 10/22/2018 Elsevier Patient Education  2023 Elsevier Inc.  

## 2022-02-06 ENCOUNTER — Telehealth: Payer: Self-pay

## 2022-02-06 NOTE — Telephone Encounter (Signed)
Patient notified. States she has an appointment tomorrow.

## 2022-02-06 NOTE — Telephone Encounter (Signed)
Copied from [employer] [id number]. Topic: Referral - Status >> Feb 06, 2022  9:52 AM Chapman Fitch wrote: Reason for CRM:pt called to see if her referral to ortho was completed / referral shows ready to schedule but no location / pt will call [facility] to see if they received a referral /please advise   Can someone look into this please? An urgent referral to Ortho was placed yesterday for the patient.

## 2022-02-07 DIAGNOSIS — M1711 Unilateral primary osteoarthritis, right knee: Secondary | ICD-10-CM | POA: Diagnosis not present

## 2022-02-12 ENCOUNTER — Other Ambulatory Visit: Payer: Self-pay | Admitting: Nurse Practitioner

## 2022-02-13 NOTE — Telephone Encounter (Signed)
Rx 05/19/21 #180 3RF- 1 year Requested Prescriptions  Pending Prescriptions Disp Refills  . 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

## 2022-03-02 NOTE — Patient Instructions (Signed)
Diabetes Mellitus Action Plan Following a diabetes action plan is a way for you to manage your diabetes (diabetes mellitus) symptoms. The plan is color-coded to help you understand what actions you need to take based on any symptoms you are having. If you have symptoms in the red zone, you need medical care right away. If you have symptoms in the yellow zone, you are having problems. If you have symptoms in the green zone, you are doing well. Learning about and understanding diabetes can take time. Follow the plan that you develop with your health care provider. Know the target range for your blood sugar (glucose) level, and review your treatment plan with your health care provider at each visit. The target range for my blood sugar level is 130 or less mg/dL. Red zone Get medical help right away if you have any of the following symptoms: A blood sugar test result that is below 54 mg/dL (3 mmol/L). A blood sugar test result that is at or above 240 mg/dL (13.3 mmol/L) for 2 days in a row. Confusion or trouble thinking clearly. Difficulty breathing. Sickness or a fever for 2 or more days that is not getting better. Moderate or large ketone levels in your urine. Feeling tired or having no energy. If you have any red zone symptoms, do not wait to see if the symptoms will go away. Get medical help right away. Call your local emergency services (911 in the [place] Do not drive yourself to the hospital. If you have severely low blood sugar (severe hypoglycemia) and you cannot eat or drink, you may need glucagon. Make sure a family member or close friend knows how to check your blood sugar and how to give you glucagon. You may need to be treated in a hospital for this condition. Yellow zone If you have any of the following symptoms, your diabetes is not under control and you may need to make some changes: A blood sugar test result that is at or above 240 mg/dL (13.3 mmol/L) for 2 days in a row. Blood  sugar test results that are below 70 mg/dL (3.9 mmol/L). Other symptoms of hypoglycemia, such as: Shaking or feeling light-headed. Confusion or irritability. Feeling hungry. Having a fast heartbeat. If you have any yellow zone symptoms: Treat your hypoglycemia by eating or drinking 15 grams of a rapid-acting carbohydrate. Follow the 15:15 rule: Take 15 grams of a rapid-acting carbohydrate, such as: 1 tube of glucose gel. 4 glucose pills. 4 oz (120 mL) of fruit juice. 4 oz (120 mL) of regular (not diet) soda. Check your blood sugar 15 minutes after you take the carbohydrate. If the repeat blood sugar test is still at or below 70 mg/dL (3.9 mmol/L), take 15 grams of a carbohydrate again. If your blood sugar does not increase above 70 mg/dL (3.9 mmol/L) after 3 tries, get medical help right away. After your blood sugar returns to normal, eat a meal or a snack within 1 hour. Keep taking your daily medicines as told by your health care provider. Check your blood sugar more often than you normally would. Write down your results. Call your health care provider if you have trouble keeping your blood sugar in your target range.  Green zone These signs mean you are doing well and you can continue what you are doing to manage your diabetes: Your blood sugar is within your personal target range. For most people, a blood sugar level before a meal (preprandial) should be 80-130 mg/dL (4.4-7.2 mmol/L).  You feel well, and you are able to do daily activities. If you are in the green zone, continue to manage your diabetes as told by your health care provider. To do this: Eat a healthy diet. Exercise regularly. Check your blood sugar as told by your health care provider. Take your medicines as told by your health care provider.  Where to find more information American Diabetes Association (ADA): diabetes.org Association of Diabetes Care & Education Specialists (ADCES):  diabeteseducator.org Summary Following a diabetes action plan is a way for you to manage your diabetes symptoms. The plan is color-coded to help you understand what actions you need to take based on any symptoms you are having. Follow the plan that you develop with your health care provider. Make sure you know your personal target blood sugar level. Review your treatment plan with your health care provider at each visit. This information is not intended to replace advice given to you by your health care provider. Make sure you discuss any questions you have with your health care provider. Document Revised: 12/15/2019 Document Reviewed: 12/15/2019 Elsevier Patient Education  [place].

## 2022-03-05 ENCOUNTER — Encounter: Payer: Self-pay | Admitting: Nurse Practitioner

## 2022-03-05 ENCOUNTER — Ambulatory Visit (INDEPENDENT_AMBULATORY_CARE_PROVIDER_SITE_OTHER): Payer: Medicare Other | Admitting: Nurse Practitioner

## 2022-03-05 VITALS — BP 102/64 | HR 73 | Temp 98.4°F | Ht 62.01 in | Wt 170.4 lb

## 2022-03-05 DIAGNOSIS — I152 Hypertension secondary to endocrine disorders: Secondary | ICD-10-CM | POA: Diagnosis not present

## 2022-03-05 DIAGNOSIS — E1149 Type 2 diabetes mellitus with other diabetic neurological complication: Secondary | ICD-10-CM

## 2022-03-05 DIAGNOSIS — I700 Atherosclerosis of aorta: Secondary | ICD-10-CM

## 2022-03-05 DIAGNOSIS — E1159 Type 2 diabetes mellitus with other circulatory complications: Secondary | ICD-10-CM | POA: Diagnosis not present

## 2022-03-05 DIAGNOSIS — N1831 Chronic kidney disease, stage 3a: Secondary | ICD-10-CM

## 2022-03-05 DIAGNOSIS — E785 Hyperlipidemia, unspecified: Secondary | ICD-10-CM | POA: Diagnosis not present

## 2022-03-05 DIAGNOSIS — N183 Chronic kidney disease, stage 3 unspecified: Secondary | ICD-10-CM

## 2022-03-05 DIAGNOSIS — I340 Nonrheumatic mitral (valve) insufficiency: Secondary | ICD-10-CM | POA: Diagnosis not present

## 2022-03-05 DIAGNOSIS — D631 Anemia in chronic kidney disease: Secondary | ICD-10-CM

## 2022-03-05 DIAGNOSIS — E1122 Type 2 diabetes mellitus with diabetic chronic kidney disease: Secondary | ICD-10-CM | POA: Diagnosis not present

## 2022-03-05 DIAGNOSIS — G4733 Obstructive sleep apnea (adult) (pediatric): Secondary | ICD-10-CM

## 2022-03-05 DIAGNOSIS — E1169 Type 2 diabetes mellitus with other specified complication: Secondary | ICD-10-CM

## 2022-03-05 LAB — BAYER DCA HB A1C WAIVED: HB A1C (BAYER DCA - WAIVED): 7.4 % — ABNORMAL HIGH (ref 4.8–5.6)

## 2022-03-05 NOTE — Assessment & Plan Note (Signed)
Noted on echo Nov. 2022. Continue collaboration with cardiology.

## 2022-03-05 NOTE — Assessment & Plan Note (Signed)
Chronic, ongoing noted on CT imaging 01/26/17. Unable to tolerate statins. Continue taking Praluent every 14 days.  

## 2022-03-05 NOTE — Assessment & Plan Note (Signed)
Chronic, ongoing. Unable to take statin due to rhabdo with associated AKI. Continue taking Praulent at this time.  Return in 3 months.

## 2022-03-05 NOTE — Assessment & Plan Note (Signed)
Chronic, ongoing. A1c today trending up to 7.4%, recent steroid injection and poor diet. She wishes to continue diet control and weight management at this time vs restart of medication. Gabapentin nightly for neuropathy. Check blood sugar daily at home. Labs A1c today.  Return in 3 months.  Consider SGLT2 for kidney health.

## 2022-03-05 NOTE — Assessment & Plan Note (Signed)
Chronic, ongoing. Continue current medication regimen and adjust as needed. Continue collaboration with nephrology locally and at [facility], recent labs reviewed. Return in 3 months.

## 2022-03-05 NOTE — Assessment & Plan Note (Signed)
Chronic, stable. BP well below goal in office today. Recommend BP checks at home a few times per week at home. DASH diet at home. Continue current medication regimen and adjust as needed. Labs up to date. Return in 3 months.

## 2022-03-05 NOTE — Assessment & Plan Note (Signed)
Chronic, stable. Continue collaboration with hematology. Recent labs reviewed.  

## 2022-03-05 NOTE — Progress Notes (Signed)
BP 102/64   Pulse 73   Temp 98.4 F (36.9 C) (Oral)   Ht 5' 2.01" (1.575 m)   Wt 170 lb 6.4 oz (77.3 kg)   LMP  (LMP Unknown)   SpO2 98%   BMI 31.16 kg/m    Subjective:    Patient ID: Tina Mcdonald, female    DOB: September 24, 1940, 81 y.o.   MRN: [id number]  HPI: Tina Mcdonald is a 81 y.o. female  Chief Complaint  Patient presents with   Diabetes   Hyperlipidemia   Hypertension   Chronic Kidney Disease   Anemia   Gastroesophageal Reflux   Medication Management    Patient says she would like to discuss with provider her current medication list, as she seems to have gotten confused over the weekend.    DIABETES A1c 6.1% on last check.  No current medications for diabetes, diet focus.  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  Does endorse staying hungry and not always choosing healthy options. Hypoglycemic episodes:no Polydipsia/polyuria: no Visual disturbance: no Chest pain: no Paresthesias: no Glucose Monitoring: yes             Accucheck frequency: occasional             Fasting glucose: 110 to 125 range             Post prandial:             Evening:             Before meals: Taking Insulin?: no             Long acting insulin:             Short acting insulin: Blood Pressure Monitoring: occasional Retinal Examination: Up To Date Foot Exam: Up to Date Pneumovax: Up to Date Influenza: Up to Date Aspirin: no   HYPERTENSION / HYPERLIPIDEMIA Continues on Amlodipine, Carvedilol, Chlorthalidone, Avapro.  Did follow with cardiology in past, last 03/22/2019. Has history of aortic atherosclerosis noted on past CT 01/26/17.  Statin reaction in past that led to admission. Currently taking Praluent.  Echo was 05/22/21 last with mild LVH, EF >60-65%, mild mitral valve regurgitation. Satisfied with current treatment? yes Duration of hypertension: chronic BP monitoring frequency: once a week BP range: 120/70 range BP medication side effects: no Duration of  hyperlipidemia: chronic Cholesterol medication side effects: no Cholesterol supplements: none Medication compliance: good compliance Aspirin: no Recent stressors: no Recurrent headaches: no Visual changes: no Palpitations: no Dyspnea: occasional Chest pain: no Lower extremity edema: occasional Dizzy/lightheaded: no   CHRONIC KIDNEY DISEASE Followed by nephrology.  Saw them 12/04/21 = CRT 1.22, GFR 45, PTH 67. CKD status: stable Medications renally dose: yes Previous renal evaluation: yes Pneumovax:  Up to Date Influenza Vaccine:  Up to Date   ANEMIA Visited with hematology on 07/29/21, recommended she continue her Slow Fe every other day.  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 Anemia status: controlled Compliance with treatment: good compliance Iron supplementation side effects: no Severity of anemia: mild Fatigue: yes -- has underlying OSA but could never afford CPAP Decreased exercise tolerance: no  Dyspnea on exertion: no Palpitations: no Bleeding: no Pica: no   Relevant past medical, surgical, family and social history reviewed and updated as indicated. Interim medical history since our last visit reviewed. Allergies and medications reviewed and updated.  Review of Systems  Constitutional:  Negative for activity change, appetite change, diaphoresis, fatigue and fever.  Respiratory:  Negative  for cough, chest tightness, shortness of breath and wheezing.   Cardiovascular:  Negative for chest pain, palpitations and leg swelling.  Gastrointestinal: Negative.   Endocrine: Negative for cold intolerance, heat intolerance, polydipsia, polyphagia and polyuria.  Neurological: Negative.   Psychiatric/Behavioral: Negative.     Per HPI unless specifically indicated above     Objective:    BP 102/64   Pulse 73   Temp 98.4 F (36.9 C) (Oral)   Ht 5' 2.01" (1.575 m)   Wt 170 lb 6.4 oz (77.3 kg)   LMP  (LMP Unknown)   SpO2 98%   BMI 31.16 kg/m   Wt Readings from Last 3  Encounters:  03/05/22 170 lb 6.4 oz (77.3 kg)  02/05/22 169 lb 4.8 oz (76.8 kg)  01/29/22 170 lb 12.8 oz (77.5 kg)    Physical Exam Vitals and nursing note reviewed.  Constitutional:      General: She is awake. She is not in acute distress.    Appearance: She is well-developed and well-groomed. She is obese. She is not ill-appearing or toxic-appearing.  HENT:     Head: Normocephalic.     Right Ear: Hearing normal.     Left Ear: Hearing normal.  Eyes:     General: Lids are normal.        Right eye: No discharge.        Left eye: No discharge.     Conjunctiva/sclera: Conjunctivae normal.     Pupils: Pupils are equal, round, and reactive to light.  Neck:     Thyroid: No thyromegaly.     Vascular: No carotid bruit.  Cardiovascular:     Rate and Rhythm: Normal rate and regular rhythm.     Heart sounds: Murmur heard.     Systolic murmur is present with a grade of 2/6.     No gallop.  Pulmonary:     Effort: Pulmonary effort is normal. No accessory muscle usage or respiratory distress.     Breath sounds: Normal breath sounds.  Abdominal:     General: Bowel sounds are normal.     Palpations: Abdomen is soft. There is no hepatomegaly or splenomegaly.  Musculoskeletal:     Cervical back: Normal range of motion and neck supple.     Right lower leg: 1+ Edema present.     Left lower leg: 1+ Edema present.  Lymphadenopathy:     Cervical: No cervical adenopathy.  Skin:    General: Skin is warm and dry.  Neurological:     Mental Status: She is alert and oriented to person, place, and time.  Psychiatric:        Attention and Perception: Attention normal.        Mood and Affect: Mood normal.        Speech: Speech normal.        Behavior: Behavior normal. Behavior is cooperative.        Thought Content: Thought content normal.    Results for orders placed or performed in visit on 03/05/22  Bayer DCA Hb A1c Waived  Result Value Ref Range   HB A1C (BAYER DCA - WAIVED) 7.4 (H) 4.8 -  5.6 %      Assessment & Plan:   Problem List Items Addressed This Visit       Cardiovascular and Mediastinum   Aortic atherosclerosis ([place])    Chronic, ongoing noted on CT imaging 01/26/17. Unable to tolerate statins. Continue taking Praluent every 14 days.  Relevant Medications   Alirocumab (PRALUENT) 75 MG/ML SOAJ   Other Relevant Orders   Lipid Panel w/o Chol/HDL Ratio   Hypertension associated with diabetes (HCC)    Chronic, stable. BP well below goal in office today. Recommend BP checks at home a few times per week at home. DASH diet at home. Continue current medication regimen and adjust as needed. Labs up to date. Return in 3 months.       Relevant Medications   Alirocumab (PRALUENT) 75 MG/ML SOAJ   Other Relevant Orders   Bayer DCA Hb A1c Waived (Completed)   Mitral valve regurgitation    Noted on echo Nov. 2022. Continue collaboration with cardiology.       Relevant Medications   Alirocumab (PRALUENT) 75 MG/ML SOAJ     Endocrine   CKD stage 3 due to type 2 diabetes mellitus (HCC)    Chronic, ongoing. Continue current medication regimen and adjust as needed. Continue collaboration with nephrology locally and at [facility], recent labs reviewed. Return in 3 months.       Relevant Orders   Bayer DCA Hb A1c Waived (Completed)   Diabetic neuropathy (HCC) - Primary    Chronic, ongoing. A1c today trending up to 7.4%, recent steroid injection and poor diet. She wishes to continue diet control and weight management at this time vs restart of medication. Gabapentin nightly for neuropathy. Check blood sugar daily at home. Labs A1c today.  Return in 3 months.  Consider SGLT2 for kidney health.      Relevant Orders   Bayer DCA Hb A1c Waived (Completed)   Hyperlipidemia associated with type 2 diabetes mellitus (HCC)    Chronic, ongoing. Unable to take statin due to rhabdo with associated AKI. Continue taking Praulent at this time.  Return in 3 months.       Relevant  Medications   Alirocumab (PRALUENT) 75 MG/ML SOAJ   Other Relevant Orders   Bayer DCA Hb A1c Waived (Completed)   Lipid Panel w/o Chol/HDL Ratio     Genitourinary   Anemia due to stage 3 chronic kidney disease (HCC)    Chronic, stable. Continue collaboration with hematology. Recent labs reviewed.           Follow up plan: Return in about 3 months (around 06/04/2022) for T2DM, HTN/HLD, CKD, ANEMIA.

## 2022-03-06 ENCOUNTER — Other Ambulatory Visit: Payer: Self-pay | Admitting: Nurse Practitioner

## 2022-03-06 DIAGNOSIS — E785 Hyperlipidemia, unspecified: Secondary | ICD-10-CM

## 2022-03-06 LAB — LIPID PANEL W/O CHOL/HDL RATIO
Cholesterol, Total: 214 mg/dL — ABNORMAL HIGH (ref 100–199)
HDL: 73 mg/dL (ref >39)
LDL Chol Calc (NIH): 125 mg/dL — ABNORMAL HIGH (ref 0–99)
Triglycerides: 92 mg/dL (ref 0–149)
VLDL Cholesterol Cal: 16 mg/dL (ref 5–40)

## 2022-03-06 MED ORDER — PRALUENT 150 MG/ML ~~LOC~~ SOAJ: 150.0000 mg | SUBCUTANEOUS | 12 refills | Status: DC

## 2022-03-06 NOTE — Progress Notes (Signed)
Contacted via [place] -- may need prior auth with increase dose in Praluent to 150 MG every 2 weeks, was on 75 MG   Good afternoon Tina Mcdonald, your labs have returned and cholesterol levels are still above goal.  I am going to increase Praluent to 150 MG every 2 weeks -- I sent in new prescription for dose increase and want to get you into lipid clinic in [place] to further discuss and get some recommendations for cholesterol control since statins are not good for you.  Any questions? Keep being amazing!!  Thank you for allowing me to participate in your care.  I appreciate you. Kindest regards, Thedford Bunton

## 2022-03-31 ENCOUNTER — Ambulatory Visit (INDEPENDENT_AMBULATORY_CARE_PROVIDER_SITE_OTHER): Payer: Medicare Other | Admitting: *Deleted

## 2022-03-31 DIAGNOSIS — Z0000 Encounter for general adult medical examination without abnormal findings: Secondary | ICD-10-CM | POA: Diagnosis not present

## 2022-03-31 NOTE — Progress Notes (Signed)
Subjective:   KINZY WEYERS is a 81 y.o. female who presents for Medicare Annual (Subsequent) preventive examination.  I connected with  [name] on 03/31/22 by a telephone enabled telemedicine application and verified that I am speaking with the correct person using two identifiers.   I discussed the limitations of evaluation and management by telemedicine. The patient expressed understanding and agreed to proceed.  Patient location: home  Provider location: Tele-health-home    Review of Systems     Cardiac Risk Factors include: advanced age (>41mn, >>41women);family history of premature cardiovascular disease;diabetes mellitus     Objective:    Today's Vitals   03/31/22 1204  PainSc: 3    There is no height or weight on file to calculate BMI.     03/31/2022   12:05 PM 01/27/2022    1:23 PM 01/25/2022   12:04 PM 07/29/2021    1:06 PM 03/29/2021    2:39 PM 10/23/2020    1:52 PM 06/08/2020    1:13 PM  Advanced Directives  Does Patient Have a Medical Advance Directive? Yes Yes No No Yes No Yes  Type of AParamedicof AMcDonaldLiving will   HSemmesLiving will  Living will;Healthcare Power of Attorney  Does patient want to make changes to medical advance directive?  Yes (ED - Information included in AVS)       Copy of [place]in Chart? No - copy requested    No - copy requested  No - copy requested  Would patient like information on creating a medical advance directive?    No - Patient declined  No - Patient declined     Current Medications (verified) Outpatient Encounter Medications as of 03/31/2022  Medication Sig   acetaminophen (TYLENOL) 325 MG tablet Take 650 mg by mouth every 6 (six) hours as needed.   albuterol (VENTOLIN HFA) 108 (90 Base) MCG/ACT inhaler Inhale 2 puffs into the lungs every 6 (six) hours as needed for wheezing or shortness of breath.   Alirocumab  (PRALUENT) 150 MG/ML SOAJ Inject 150 mg into the skin every 14 (fourteen) days.   amLODipine (NORVASC) 10 MG tablet Take 10 mg by mouth every morning.   blood glucose meter kit and supplies KIT Dispense based on patient and insurance preference. Use up to four times daily as directed. (FOR ICD-9 250.00, 250.01).   carvedilol (COREG) 12.5 MG tablet Take 12.5 mg by mouth 2 (two) times daily with a meal.   chlorthalidone (HYGROTON) 25 MG tablet Take 25 mg by mouth daily.   Cholecalciferol (VITAMIN D-1000 MAX ST) 25 MCG (1000 UT) tablet Take by mouth.   diclofenac sodium (VOLTAREN) 1 % GEL Apply 2 g topically 2 (two) times daily as needed.   gabapentin (NEURONTIN) 100 MG capsule TAKE 2 CAPSULES BY MOUTH AT BEDTIME (Patient taking differently: daily.)   glucose blood test strip Check daily. ICD 10 E11.9   irbesartan (AVAPRO) 300 MG tablet Take 1 tablet (300 mg total) by mouth daily. New blood pressure medicine.   omeprazole (PRILOSEC) 20 MG capsule Take 1 capsule (20 mg total) by mouth 2 (two) times daily before a meal.   triamcinolone cream (KENALOG) 0.1 % Apply 1 Application topically 2 (two) times daily.   vitamin C (ASCORBIC ACID) 250 MG tablet Take 250 mg by mouth every other day.   No facility-administered encounter medications on file as of 03/31/2022.    Allergies (verified)  Statins, Contrast media [iodinated contrast media], Penicillins, Sulfa antibiotics, Aloe, Latex, Neosporin [neomycin-bacitracin zn-polymyx], and Other   History: Past Medical History:  Diagnosis Date   Anemia    Arthritis    Chronic kidney disease    Colon polyps    Diabetes mellitus without complication (HCC)    Diabetic neuropathy (HCC)    Dyspnea    GERD (gastroesophageal reflux disease)    Hyperlipemia    Hypertension    Osteoarthritis    Radiculopathy    weakness in right leg   Sleep apnea    does not have her cpap anymore   Stress incontinence    Urticaria    Vitamin B 12 deficiency    Wears  glasses    Past Surgical History:  Procedure Laterality Date   ABDOMINAL HYSTERECTOMY     APPENDECTOMY     BREAST BIOPSY Right    right-neg   BREAST BIOPSY Left    us/bx/clip-neg   CARPAL TUNNEL RELEASE     right   COLONOSCOPY     COLONOSCOPY WITH PROPOFOL N/A 12/10/2015   Procedure: COLONOSCOPY WITH PROPOFOL;  Surgeon: Lucilla Lame, MD;  Location: [place];  Service: Endoscopy;  Laterality: N/A;  Diabetic - oral meds Sleep Apnea LATEX allergy   COLONOSCOPY WITH PROPOFOL N/A 01/17/2019   Procedure: COLONOSCOPY WITH PROPOFOL;  Surgeon: Virgel Manifold, MD;  Location: [place];  Service: Endoscopy;  Laterality: N/A;   CYSTOSCOPY     DILATION AND CURETTAGE OF UTERUS     ESOPHAGOGASTRODUODENOSCOPY (EGD) WITH PROPOFOL N/A 01/17/2019   Procedure: ESOPHAGOGASTRODUODENOSCOPY (EGD) WITH PROPOFOL;  Surgeon: Virgel Manifold, MD;  Location: [place];  Service: Endoscopy;  Laterality: N/A;   EYE SURGERY Bilateral    lens implant   GIVENS CAPSULE STUDY N/A 05/04/2019   Procedure: GIVENS CAPSULE STUDY;  Surgeon: Virgel Manifold, MD;  Location: [facility];  Service: Endoscopy;  Laterality: N/A;   LUMBAR FUSION  2009   POLYPECTOMY  12/10/2015   Procedure: POLYPECTOMY;  Surgeon: Lucilla Lame, MD;  Location: [place];  Service: Endoscopy;;   POLYPECTOMY  01/17/2019   Procedure: POLYPECTOMY;  Surgeon: Virgel Manifold, MD;  Location: [place];  Service: Endoscopy;;   RESECTION DISTAL CLAVICAL Right 11/25/2013   Procedure: RIGHT SHOULDER OPEN RESECTION DISTAL CLAVICAL EXCISION SOFT TISSUE TUMOR SHOULDER DEEP SUBFASCIAL INTRAMUSCULAR/DEBRIDEMENT/ROTATOR CUFF REPAIR/BICEPS TENODESIS/MANIPULATION;  Surgeon: Renette Butters, MD;  Location: [place];  Service: Orthopedics;  Laterality: Right;   SHOULDER ARTHROSCOPY WITH ROTATOR CUFF REPAIR AND SUBACROMIAL DECOMPRESSION Right 11/25/2013   Procedure: SHOULDER ARTHROSCOPY WITH  DEBRIDEMENT ROTATOR CUFF REPAIR  A;  Surgeon: Renette Butters, MD;  Location: [place];  Service: Orthopedics;  Laterality: Right;   Family History  Problem Relation Age of Onset   Heart disease Mother    Diabetes Mother    Hypertension Mother    Heart disease Father    Hypertension Father    Diabetes Sister    Hypertension Sister    Heart disease Sister    Kidney disease Sister    Diabetes Sister    Hypertension Sister    Kidney disease Sister    Non-Hodgkin's lymphoma Maternal Uncle    Breast cancer Cousin 7   Social History   Socioeconomic History   Marital status: Single    Spouse name: Not on file   Number of children: Not on file   Years of education: Not on file   Highest  education level: Some college, no degree  Occupational History   Occupation: retired  Tobacco Use   Smoking status: Former    Packs/day: 1.00    Years: 15.00    Total pack years: 15.00    Types: Cigarettes    Quit date: 11/22/1997    Years since quitting: 24.3   Smokeless tobacco: Never  Vaping Use   Vaping Use: Never used  Substance and Sexual Activity   Alcohol use: No   Drug use: No   Sexual activity: Not Currently  Other Topics Concern   Not on file  Social History Narrative   Not on file   Social Determinants of Health   Financial Resource Strain: Low Risk  (03/31/2022)   Overall Financial Resource Strain (CARDIA)    Difficulty of Paying Living Expenses: Not very hard  Food Insecurity: No Food Insecurity (03/31/2022)   Hunger Vital Sign    Worried About Running Out of Food in the Last Year: Never true    Ran Out of Food in the Last Year: Never true  Transportation Needs: No Transportation Needs (03/31/2022)   PRAPARE - Hydrologist (Medical): No    Lack of Transportation (Non-Medical): No  Physical Activity: Inactive (03/31/2022)   Exercise Vital Sign    Days of Exercise per Week: 0 days    Minutes of Exercise per Session: 0 min   Stress: No Stress Concern Present (03/31/2022)   [place]    Feeling of Stress : Not at all  Social Connections: Unknown (03/31/2022)   Social Connection and Isolation Panel [NHANES]    Frequency of Communication with Friends and Family: More than three times a week    Frequency of Social Gatherings with Friends and Family: Never    Attends Religious Services: More than 4 times per year    Active Member of [employer] or Organizations: Yes    Attends Music therapist: More than 4 times per year    Marital Status: Not on file    Tobacco Counseling Counseling given: Not Answered   Clinical Intake:  Pre-visit preparation completed: Yes  Pain : 0-10 Pain Score: 3  Pain Location: Back Pain Descriptors / Indicators: Burning, Aching Pain Onset: More than a month ago Pain Frequency: Constant Pain Relieving Factors: voltaren gel  Pain Relieving Factors: voltaren gel  Diabetes: Yes CBG done?: No Did pt. bring in CBG monitor from home?: No  How often do you need to have someone help you when you read instructions, pamphlets, or other written materials from your doctor or pharmacy?: 1 - Never  Diabetic?  Yes  Nutrition Risk Assessment:  Has the patient had any N/V/D within the last 2 months?  No  Does the patient have any non-healing wounds?  No  Has the patient had any unintentional weight loss or weight gain?  No   Diabetes:  Is the patient diabetic?  Yes  If diabetic, was a CBG obtained today?  No  Did the patient bring in their glucometer from home?  No  How often do you monitor your CBG's? 3 x a week.   Financial Strains and Diabetes Management:  Are you having any financial strains with the device, your supplies or your medication? No .  Does the patient want to be seen by Chronic Care Management for management of their diabetes?  No  Would the patient like to be referred to a Nutritionist or  for  Diabetic Management?  No   Diabetic Exams:  Diabetic Eye Exam: Pt has been advised about the importance in completing this exam. .  Diabetic Foot Exam: Pt has been advised about the importance in completing this exam..    Interpreter Needed?: No  Information entered by :: Leroy Kennedy LPN   Activities of Daily Living    03/31/2022   12:53 PM  In your present state of health, do you have any difficulty performing the following activities:  Hearing? 0  Vision? 0  Difficulty concentrating or making decisions? 0  Walking or climbing stairs? 1  Comment sob  Dressing or bathing? 0  Doing errands, shopping? 0  Preparing Food and eating ? N  Using the Toilet? N  In the past six months, have you accidently leaked urine? N  Do you have problems with loss of bowel control? N  Managing your Medications? N  Managing your Finances? N  Housekeeping or managing your Housekeeping? N    Patient Care Team: [name], NP as PCP - General (Nurse Practitioner) Yolonda Kida, MD (Internal Medicine) Vanita Ingles, RN as Case Manager (General Practice)  Indicate any recent Medical Services you may have received from other than [facility] providers in the past year (date may be approximate).     Assessment:   This is a routine wellness examination for [place].  Hearing/Vision screen Hearing Screening - Comments:: No trouble hearing Vision Screening - Comments:: [facility]   mebane Dr. Zigmund Daniel in [place]  Dietary issues and exercise activities discussed: Current Exercise Habits: The patient does not participate in regular exercise at present, Exercise limited by: respiratory conditions(s)   Goals Addressed               This Visit's Progress     Patient Stated   On track     03/26/2020, no goals      Patient Stated   On track     03/29/2021, stay up on kidneys      Patient Stated        No goals      PharmD "I want to take care of my medications" (pt-stated)   Not  on track     [place] (see longtitudinal plan of care for additional care plan information)  Current Barriers:  Polypharmacy; complex patient with multiple comorbidities including T2DM, HTN, anemia, CKD, OA, GERD S/p visit on Monday for diarrhea. Notes it has improved somewhat, but she has not had a chance to go by the pharmacy yet to pick up metronidazole/cipro, as she been taking of her sister after having surgery on both eyes last week. Notes that PCP ordered labwork for her, but she has not been contacted to schedule this or f/u with PCP. She is unsure if she is supposed to have labwork now, or when she next sees PCP. Self-manages medications.  Anemia: slow FE + Vitamin C 500 mg daily; reports that she is taking 2 250 mg tabs, wonders if she can take 1 500 mg tab T2DM; controlled, last A1c 6.2%; metformin 500 mg BID; ASCVD risk reduction:  HTN: HCTZ 25 mg daily, clonidine 0.1 mg BID, metoprolol succinate 50 mg daily; is not checking BP at home as she does not have a cuff; wonders if this is something she can purchase from [employer] HLD: atorvastatin 80 mg daily, except Saturdays when she takes fluconazole as per Dr. Nicole Kindred, dermatology; last LDL not at goal <100, however, she had been  holding atorvastatin during concurrent fluconazole therapy. Now sh GERD: omeprazole 20 mg daily  OA: gabapentin 200 mg QPM, diclofenac gel PRN to knees. Acetaminophen 650 mg PRN. Wonders how often she can combine APAP and gabapentin Nail fungus: fluconazole 200 mg every Saturday, ciclopirox lacquer. Has f/u with Dr. Nicole Kindred in mid August, but only has 1 more pill of fluconazole. Wonders if she is to continue therapy  Vitamin B 12 deficiency. Notes she needs to schedule her injection for this month  Pharmacist Clinical Goal(s):  Over the next 90 days, patient will work with PharmD and provider towards optimized medication management  Interventions: Comprehensive medication review  performed, medication list updated in electronic medical record Inter-disciplinary care team collaboration (see longitudinal plan of care) Praised for maintenance of goal A1c. Encouraged continued focus on adherence to medications and a low carbohydrate diet.  Discussed goal LDL <70 given DM and ASCVD risk factors. Atorvastatin dosing has not been regular over the past few months d/t concurrent fluconazole therapy. Recommend recheck in a few months, now that patient is taking atorvastaitn 80 mg 6 days per week.  Reviewed proper BP checking technique. Discussed Over the Counter benefits through [facility], encouraged patient to pursue purchasing an arm BP cuff with these benefits. Patient verbalized understanding Discussed max daily APAP dose of 4000 mg daily. Discussed that she can combine w/ gabapentin, or she can take APAP during the day and gabapentin at night to avoid daytime sedation. Recommended she avoid APAP PM.  Patient verbalized understanding.  Will collaborate w/ PCP and clinical staff regarding plan for patient to schedule lab work, PCP f/u, and Vitamin B12 injection Will message Dr. Nicole Kindred to see if patient is to continue fluconazole until she sees her for f/u on 02/07/20, and if so, to request a refill be sent to [facility]. Reviewed role of RN CM on team and discussed upcoming outreach appointment  Patient Self Care Activities:  Patient will take medications as prescribed  Please see past updates related to this goal by clicking on the "Past Updates" button in the selected goal         Depression Screen    03/31/2022   12:28 PM 03/05/2022   11:04 AM 02/05/2022    4:18 PM 01/29/2022    4:14 PM 12/19/2021    1:07 PM 12/03/2021   10:56 AM 11/12/2021    2:32 PM  PHQ 2/9 Scores  PHQ - 2 Score 0 0 0 1  1 0  PHQ- 9 Score 0 '2 3 3  4 3  ' Exception Documentation     Patient refusal      Fall Risk    03/31/2022   12:06 PM 03/05/2022   11:05 AM 02/05/2022    4:18 PM 01/29/2022    4:14 PM  12/19/2021    1:07 PM  [place] in the past year? 0 0 0 0 0  Number falls in past yr: 0 0 0 0 0  Injury with Fall? 0 0 0 0 0  Risk for fall due to :  No Fall Risks No Fall Risks No Fall Risks No Fall Risks  Follow up Falls evaluation completed;Education provided;Falls prevention discussed Falls evaluation completed Falls evaluation completed Falls evaluation completed Falls evaluation completed    FALL RISK PREVENTION PERTAINING TO THE HOME:  Any stairs in or around the home? No  If so, are there any without handrails? No  Home free of loose throw rugs in walkways, pet beds, electrical cords,  etc? Yes  Adequate lighting in your home to reduce risk of falls? Yes   ASSISTIVE DEVICES UTILIZED TO PREVENT FALLS:  Life alert? No  Use of a cane, walker or w/c? No  Grab bars in the bathroom? Yes  Shower chair or bench in shower? No  Elevated toilet seat or a handicapped toilet? No   TIMED UP AND GO:  Was the test performed? No .   Cognitive Function:        03/31/2022   12:09 PM 03/29/2021    2:46 PM 03/26/2020    2:35 PM 03/04/2018    2:47 PM 02/05/2017    2:01 PM  6CIT Screen  What Year? 0 points 0 points 0 points 0 points 0 points  What month? 0 points 0 points 0 points 0 points 0 points  What time? 0 points 0 points 0 points 0 points 0 points  Count back from 20 0 points 0 points 0 points 0 points 0 points  Months in reverse 2 points 0 points 0 points 0 points 0 points  Repeat phrase 0 points 2 points 0 points 0 points 0 points  Total Score 2 points 2 points 0 points 0 points 0 points    Immunizations Immunization History  Administered Date(s) Administered   Influenza,inj,Quad PF,6+ Mos 04/02/2021   PFIZER(Purple Top)SARS-COV-2 Vaccination 08/01/2019, 08/22/2019, 04/03/2020   Pfizer Covid-19 Vaccine Bivalent Booster 9yr & up 04/02/2021   Pneumococcal Conjugate-13 10/12/2013   Pneumococcal Polysaccharide-23 05/28/2010, 11/26/2021   Td 10/02/2006, 08/24/2020    Zoster Recombinat (Shingrix) 11/26/2021    TDAP status: Up to date  Flu Vaccine status: Due, Education has been provided regarding the importance of this vaccine. Advised may receive this vaccine at local pharmacy or Health Dept. Aware to provide a copy of the vaccination record if obtained from local pharmacy or Health Dept. Verbalized acceptance and understanding.  Pneumococcal vaccine status: Up to date  Covid-19 vaccine status: Information provided on how to obtain vaccines.   Qualifies for Shingles Vaccine? Yes   Zostavax completed No   Shingrix Completed?: No.    Education has been provided regarding the importance of this vaccine. Patient has been advised to call insurance company to determine out of pocket expense if they have not yet received this vaccine. Advised may also receive vaccine at local pharmacy or Health Dept. Verbalized acceptance and understanding.  Screening Tests Health Maintenance  Topic Date Due   Zoster Vaccines- Shingrix (2 of 2) 01/21/2022   COVID-19 Vaccine (5 - Pfizer risk series) 04/16/2022 (Originally 05/28/2021)   INFLUENZA VACCINE  09/21/2022 (Originally 01/21/2022)   FOOT EXAM  05/24/2022   Diabetic kidney evaluation - GFR measurement  07/26/2022   HEMOGLOBIN A1C  09/03/2022   OPHTHALMOLOGY EXAM  09/25/2022   Diabetic kidney evaluation - Urine ACR  11/27/2022   TETANUS/TDAP  08/25/2030   Pneumonia Vaccine 81 Years old  Completed   DEXA SCAN  Completed   Hepatitis C Screening  Completed   HPV VACCINES  Aged Out    Health Maintenance  Health Maintenance Due  Topic Date Due   Zoster Vaccines- Shingrix (2 of 2) 01/21/2022    Colorectal cancer screening: No longer required.   Mammogram status: Completed  . Repeat every year  Bone Density 2017  will have with next year with mammogram  Lung Cancer Screening: (Low Dose CT Chest recommended if Age 79-80years, 30 pack-year currently smoking OR have quit w/in 15years.) does not qualify.   Lung  Cancer  Screening Referral:   Additional Screening:  Hepatitis C Screening: does not qualify;  Vision Screening: Recommended annual ophthalmology exams for early detection of glaucoma and other disorders of the eye. Is the patient up to date with their annual eye exam?  Yes  Who is the provider or what is the name of the office in which the patient attends annual eye exams? Dr. Rodena Piety If pt is not established with a provider, would they like to be referred to a provider to establish care? No .   Dental Screening: Recommended annual dental exams for proper oral hygiene  Community Resource Referral / Chronic Care Management: CRR required this visit?  No   CCM required this visit?  No      Plan:     I have personally reviewed and noted the following in the patient's chart:   Medical and social history Use of alcohol, tobacco or illicit drugs  Current medications and supplements including opioid prescriptions. Patient is not currently taking opioid prescriptions. Functional ability and status Nutritional status Physical activity Advanced directives List of other physicians Hospitalizations, surgeries, and ER visits in previous 12 months Vitals Screenings to include cognitive, depression, and falls Referrals and appointments  In addition, I have reviewed and discussed with patient certain preventive protocols, quality metrics, and best practice recommendations. A written personalized care plan for preventive services as well as general preventive health recommendations were provided to patient.     Leroy Kennedy, LPN   [id number]   Nurse Notes:

## 2022-03-31 NOTE — Patient Instructions (Signed)
Tina Mcdonald , Thank you for taking time to come for your Medicare Wellness Visit. I appreciate your ongoing commitment to your health goals. Please review the following plan we discussed and let me know if I can assist you in the future.   These are the goals we discussed:  Goals       Patient Stated      03/26/2020, no goals      Patient Stated      03/29/2021, stay up on kidneys      Patient Stated      No goals      PharmD "I want to take care of my medications" (pt-stated)      [place] (see longtitudinal plan of care for additional care plan information)  Current Barriers:  Polypharmacy; complex patient with multiple comorbidities including T2DM, HTN, anemia, CKD, OA, GERD S/p visit on Monday for diarrhea. Notes it has improved somewhat, but she has not had a chance to go by the pharmacy yet to pick up metronidazole/cipro, as she been taking of her sister after having surgery on both eyes last week. Notes that PCP ordered labwork for her, but she has not been contacted to schedule this or f/u with PCP. She is unsure if she is supposed to have labwork now, or when she next sees PCP. Self-manages medications.  Anemia: slow FE + Vitamin C 500 mg daily; reports that she is taking 2 250 mg tabs, wonders if she can take 1 500 mg tab T2DM; controlled, last A1c 6.2%; metformin 500 mg BID; ASCVD risk reduction:  HTN: HCTZ 25 mg daily, clonidine 0.1 mg BID, metoprolol succinate 50 mg daily; is not checking BP at home as she does not have a cuff; wonders if this is something she can purchase from [employer] HLD: atorvastatin 80 mg daily, except Saturdays when she takes fluconazole as per Dr. Nicole Kindred, dermatology; last LDL not at goal <100, however, she had been holding atorvastatin during concurrent fluconazole therapy. Now sh GERD: omeprazole 20 mg daily  OA: gabapentin 200 mg QPM, diclofenac gel PRN to knees. Acetaminophen 650 mg PRN. Wonders how often she can combine APAP  and gabapentin Nail fungus: fluconazole 200 mg every Saturday, ciclopirox lacquer. Has f/u with Dr. Nicole Kindred in mid August, but only has 1 more pill of fluconazole. Wonders if she is to continue therapy  Vitamin B 12 deficiency. Notes she needs to schedule her injection for this month  Pharmacist Clinical Goal(s):  Over the next 90 days, patient will work with PharmD and provider towards optimized medication management  Interventions: Comprehensive medication review performed, medication list updated in electronic medical record Inter-disciplinary care team collaboration (see longitudinal plan of care) Praised for maintenance of goal A1c. Encouraged continued focus on adherence to medications and a low carbohydrate diet.  Discussed goal LDL <70 given DM and ASCVD risk factors. Atorvastatin dosing has not been regular over the past few months d/t concurrent fluconazole therapy. Recommend recheck in a few months, now that patient is taking atorvastaitn 80 mg 6 days per week.  Reviewed proper BP checking technique. Discussed Over the Counter benefits through [facility], encouraged patient to pursue purchasing an arm BP cuff with these benefits. Patient verbalized understanding Discussed max daily APAP dose of 4000 mg daily. Discussed that she can combine w/ gabapentin, or she can take APAP during the day and gabapentin at night to avoid daytime sedation. Recommended she avoid APAP PM.  Patient verbalized understanding.  Will collaborate  w/ PCP and clinical staff regarding plan for patient to schedule lab work, PCP f/u, and Vitamin B12 injection Will message Dr. Nicole Kindred to see if patient is to continue fluconazole until she sees her for f/u on 02/07/20, and if so, to request a refill be sent to [facility]. Reviewed role of RN CM on team and discussed upcoming outreach appointment  Patient Self Care Activities:  Patient will take medications as prescribed  Please see past updates related to this goal by clicking  on the "Past Updates" button in the selected goal          This is a list of the screening recommended for you and due dates:  Health Maintenance  Topic Date Due   Zoster (Shingles) Vaccine (2 of 2) 01/21/2022   COVID-19 Vaccine (5 - Pfizer risk series) 04/16/2022*   Flu Shot  09/21/2022*   Complete foot exam   05/24/2022   Yearly kidney function blood test for diabetes  07/26/2022   Hemoglobin A1C  09/03/2022   Eye exam for diabetics  09/25/2022   Yearly kidney health urinalysis for diabetes  11/27/2022   Tetanus Vaccine  08/25/2030   Pneumonia Vaccine  Completed   DEXA scan (bone density measurement)  Completed   Hepatitis C Screening: USPSTF Recommendation to screen - Ages 73-79 yo.  Completed   HPV Vaccine  Aged Out  *Topic was postponed. The date shown is not the original due date.    Advanced directives: Education provided  Conditions/risks identified:      Preventive Care 26 Years and Older, Female Preventive care refers to lifestyle choices and visits with your health care provider that can promote health and wellness. What does preventive care include? A yearly physical exam. This is also called an annual well check. Dental exams once or twice a year. Routine eye exams. Ask your health care provider how often you should have your eyes checked. Personal lifestyle choices, including: Daily care of your teeth and gums. Regular physical activity. Eating a healthy diet. Avoiding tobacco and drug use. Limiting alcohol use. Practicing safe sex. Taking low-dose aspirin every day. Taking vitamin and mineral supplements as recommended by your health care provider. What happens during an annual well check? The services and screenings done by your health care provider during your annual well check will depend on your age, overall health, lifestyle risk factors, and family history of disease. Counseling  Your health care provider may ask you questions about  your: Alcohol use. Tobacco use. Drug use. Emotional well-being. Home and relationship well-being. Sexual activity. Eating habits. History of falls. Memory and ability to understand (cognition). Work and work Statistician. Reproductive health. Screening  You may have the following tests or measurements: Height, weight, and BMI. Blood pressure. Lipid and cholesterol levels. These may be checked every 5 years, or more frequently if you are over 70 years old. Skin check. Lung cancer screening. You may have this screening every year starting at age 35 if you have a 30-pack-year history of smoking and currently smoke or have quit within the past 15 years. Fecal occult blood test (FOBT) of the stool. You may have this test every year starting at age 50. Flexible sigmoidoscopy or colonoscopy. You may have a sigmoidoscopy every 5 years or a colonoscopy every 10 years starting at age 19. Hepatitis C blood test. Hepatitis B blood test. Sexually transmitted disease (STD) testing. Diabetes screening. This is done by checking your blood sugar (glucose) after you have not eaten for a  while (fasting). You may have this done every 1-3 years. Bone density scan. This is done to screen for osteoporosis. You may have this done starting at age 7. Mammogram. This may be done every 1-2 years. Talk to your health care provider about how often you should have regular mammograms. Talk with your health care provider about your test results, treatment options, and if necessary, the need for more tests. Vaccines  Your health care provider may recommend certain vaccines, such as: Influenza vaccine. This is recommended every year. Tetanus, diphtheria, and acellular pertussis (Tdap, Td) vaccine. You may need a Td booster every 10 years. Zoster vaccine. You may need this after age 71. Pneumococcal 13-valent conjugate (PCV13) vaccine. One dose is recommended after age 1. Pneumococcal polysaccharide (PPSV23) vaccine.  One dose is recommended after age 17. Talk to your health care provider about which screenings and vaccines you need and how often you need them. This information is not intended to replace advice given to you by your health care provider. Make sure you discuss any questions you have with your health care provider. Document Released: 07/06/2015 Document Revised: 02/27/2016 Document Reviewed: 04/10/2015 Elsevier Interactive Patient Education  2017 [place] Prevention in the Home Falls can cause injuries. They can happen to people of all ages. There are many things you can do to make your home safe and to help prevent falls. What can I do on the outside of my home? Regularly fix the edges of walkways and driveways and fix any cracks. Remove anything that might make you trip as you walk through a door, such as a raised step or threshold. Trim any bushes or trees on the path to your home. Use bright outdoor lighting. Clear any walking paths of anything that might make someone trip, such as rocks or tools. Regularly check to see if handrails are loose or broken. Make sure that both sides of any steps have handrails. Any raised decks and porches should have guardrails on the edges. Have any leaves, snow, or ice cleared regularly. Use sand or salt on walking paths during winter. Clean up any spills in your garage right away. This includes oil or grease spills. What can I do in the bathroom? Use night lights. Install grab bars by the toilet and in the tub and shower. Do not use towel bars as grab bars. Use non-skid mats or decals in the tub or shower. If you need to sit down in the shower, use a plastic, non-slip stool. Keep the floor dry. Clean up any water that spills on the floor as soon as it happens. Remove soap buildup in the tub or shower regularly. Attach bath mats securely with double-sided non-slip rug tape. Do not have throw rugs and other things on the floor that can make  you trip. What can I do in the bedroom? Use night lights. Make sure that you have a light by your bed that is easy to reach. Do not use any sheets or blankets that are too big for your bed. They should not hang down onto the floor. Have a firm chair that has side arms. You can use this for support while you get dressed. Do not have throw rugs and other things on the floor that can make you trip. What can I do in the kitchen? Clean up any spills right away. Avoid walking on wet floors. Keep items that you use a lot in easy-to-reach places. If you need to reach something above you,  use a strong step stool that has a grab bar. Keep electrical cords out of the way. Do not use floor polish or wax that makes floors slippery. If you must use wax, use non-skid floor wax. Do not have throw rugs and other things on the floor that can make you trip. What can I do with my stairs? Do not leave any items on the stairs. Make sure that there are handrails on both sides of the stairs and use them. Fix handrails that are broken or loose. Make sure that handrails are as long as the stairways. Check any carpeting to make sure that it is firmly attached to the stairs. Fix any carpet that is loose or worn. Avoid having throw rugs at the top or bottom of the stairs. If you do have throw rugs, attach them to the floor with carpet tape. Make sure that you have a light switch at the top of the stairs and the bottom of the stairs. If you do not have them, ask someone to add them for you. What else can I do to help prevent falls? Wear shoes that: Do not have high heels. Have rubber bottoms. Are comfortable and fit you well. Are closed at the toe. Do not wear sandals. If you use a stepladder: Make sure that it is fully opened. Do not climb a closed stepladder. Make sure that both sides of the stepladder are locked into place. Ask someone to hold it for you, if possible. Clearly mark and make sure that you can  see: Any grab bars or handrails. First and last steps. Where the edge of each step is. Use tools that help you move around (mobility aids) if they are needed. These include: Canes. Walkers. Scooters. Crutches. Turn on the lights when you go into a dark area. Replace any light bulbs as soon as they burn out. Set up your furniture so you have a clear path. Avoid moving your furniture around. If any of your floors are uneven, fix them. If there are any pets around you, be aware of where they are. Review your medicines with your doctor. Some medicines can make you feel dizzy. This can increase your chance of falling. Ask your doctor what other things that you can do to help prevent falls. This information is not intended to replace advice given to you by your health care provider. Make sure you discuss any questions you have with your health care provider. Document Released: 04/05/2009 Document Revised: 11/15/2015 Document Reviewed: 07/14/2014 Elsevier Interactive Patient Education  2017 [employer].

## 2022-04-07 DIAGNOSIS — M1711 Unilateral primary osteoarthritis, right knee: Secondary | ICD-10-CM | POA: Diagnosis not present

## 2022-04-11 DIAGNOSIS — M25561 Pain in right knee: Secondary | ICD-10-CM | POA: Diagnosis not present

## 2022-04-11 DIAGNOSIS — E119 Type 2 diabetes mellitus without complications: Secondary | ICD-10-CM | POA: Diagnosis not present

## 2022-04-18 DIAGNOSIS — M1711 Unilateral primary osteoarthritis, right knee: Secondary | ICD-10-CM | POA: Diagnosis not present

## 2022-04-19 ENCOUNTER — Other Ambulatory Visit: Payer: Self-pay | Admitting: Nurse Practitioner

## 2022-04-21 NOTE — Telephone Encounter (Signed)
Requested Prescriptions  Pending Prescriptions Disp Refills  . gabapentin (NEURONTIN) 100 MG capsule [Pharmacy Med Name: Gabapentin 100 MG Oral Capsule] 180 capsule 3    Sig: TAKE 2 CAPSULES BY MOUTH AT  BEDTIME     Neurology: Anticonvulsants - gabapentin Failed - 04/19/2022 11:26 PM      Failed - Cr in normal range and within 360 days    Creatinine, Ser  Date Value Ref Range Status  07/26/2021 1.03 (H) 0.44 - 1.00 mg/dL Final   Creatinine, Urine  Date Value Ref Range Status  10/24/2020 28 mg/dL Final         Passed - Completed PHQ-2 or PHQ-9 in the last 360 days      Passed - Valid encounter within last 12 months    Recent Outpatient Visits          1 month ago 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

## 2022-04-22 DIAGNOSIS — I10 Essential (primary) hypertension: Secondary | ICD-10-CM | POA: Diagnosis not present

## 2022-04-22 DIAGNOSIS — M6282 Rhabdomyolysis: Secondary | ICD-10-CM | POA: Diagnosis not present

## 2022-04-22 DIAGNOSIS — N183 Chronic kidney disease, stage 3 unspecified: Secondary | ICD-10-CM | POA: Diagnosis not present

## 2022-04-22 DIAGNOSIS — N1831 Chronic kidney disease, stage 3a: Secondary | ICD-10-CM | POA: Diagnosis not present

## 2022-04-22 DIAGNOSIS — Z882 Allergy status to sulfonamides status: Secondary | ICD-10-CM | POA: Diagnosis not present

## 2022-04-22 DIAGNOSIS — M25561 Pain in right knee: Secondary | ICD-10-CM | POA: Diagnosis not present

## 2022-04-22 DIAGNOSIS — M7989 Other specified soft tissue disorders: Secondary | ICD-10-CM | POA: Diagnosis not present

## 2022-04-22 DIAGNOSIS — Z7984 Long term (current) use of oral hypoglycemic drugs: Secondary | ICD-10-CM | POA: Diagnosis not present

## 2022-04-22 DIAGNOSIS — I129 Hypertensive chronic kidney disease with stage 1 through stage 4 chronic kidney disease, or unspecified chronic kidney disease: Secondary | ICD-10-CM | POA: Diagnosis not present

## 2022-04-22 DIAGNOSIS — E1122 Type 2 diabetes mellitus with diabetic chronic kidney disease: Secondary | ICD-10-CM | POA: Diagnosis not present

## 2022-04-22 DIAGNOSIS — E785 Hyperlipidemia, unspecified: Secondary | ICD-10-CM | POA: Diagnosis not present

## 2022-04-22 DIAGNOSIS — R600 Localized edema: Secondary | ICD-10-CM | POA: Diagnosis not present

## 2022-04-22 DIAGNOSIS — Z23 Encounter for immunization: Secondary | ICD-10-CM | POA: Diagnosis not present

## 2022-04-22 DIAGNOSIS — E1140 Type 2 diabetes mellitus with diabetic neuropathy, unspecified: Secondary | ICD-10-CM | POA: Diagnosis not present

## 2022-04-29 ENCOUNTER — Other Ambulatory Visit: Payer: Self-pay

## 2022-04-29 MED ORDER — PRALUENT 150 MG/ML ~~LOC~~ SOAJ: 150.0000 mg | SUBCUTANEOUS | 12 refills | Status: DC

## 2022-04-30 DIAGNOSIS — M7989 Other specified soft tissue disorders: Secondary | ICD-10-CM | POA: Diagnosis not present

## 2022-05-07 ENCOUNTER — Ambulatory Visit: Payer: Self-pay

## 2022-05-07 ENCOUNTER — Other Ambulatory Visit: Payer: Self-pay | Admitting: Nurse Practitioner

## 2022-05-07 MED ORDER — AMLODIPINE BESYLATE 10 MG PO TABS: 10.0000 mg | ORAL_TABLET | Freq: Every morning | ORAL | 4 refills | Status: DC

## 2022-05-07 NOTE — Telephone Encounter (Signed)
Please call patient and she if she can come into see anyone else before her scheduled appointment on Friday.

## 2022-05-07 NOTE — Telephone Encounter (Signed)
Message from Scherrie Gerlach sent at 05/07/2022  9:17 AM EST  Summary: med question   Pt wants to know if she is supposed to be taking amLODipine (NORVASC) 10 MG tablet.   If so, can Jolene prescribe?  Pt wonders if this is why she does not have any energy.         Chief Complaint: fatigue SOB Symptoms: SOB with exertion, sleepier Frequency: 2 weeks Pertinent Negatives: Patient denies chest pain, swelling to hands or left ankle swelling. Pt stated right leg is unchanged. Pt able to talk in full sentences, vomiting, diarrhea, bleeding, other areas of pain (except for right leg/knee pain) Disposition: '[]'$ ED /'[]'$ Urgent Care (no appt availability in office) / '[x]'$ Appointment(In office/virtual)/ '[]'$  [place] [facility]/ '[]'$ Home Care/ '[]'$ Refused Recommended Disposition /'[]'$ [place] [facility]/ '[]'$  Follow-up with PCP Additional Notes: called office and discussed assessment with [place] regarding disposition. Pt mentioned that she has been taking amlodipine as well. Advised pt to discontinue med. Please call pt and advise.   Reason for Disposition  [1] MODERATE weakness (i.e., interferes with work, school, normal activities) AND [2] cause unknown  (Exceptions: Weakness from acute minor illness or poor fluid intake; weakness is chronic and not worse.)  Answer Assessment - Initial Assessment Questions 1. DESCRIPTION: "Describe how you are feeling."     Sleep and get SOB 2. SEVERITY: "How bad is it?"  "Can you stand and walk?"   - MILD (0-3): Feels weak or tired, but does not interfere with work, school or normal activities.   - MODERATE (4-7): Able to stand and walk; weakness interferes with work, school, or normal activities.   - SEVERE (8-10): Unable to stand or walk; unable to do usual activities.     moderate 3. ONSET: "When did these symptoms begin?" (e.g., hours, days, weeks, months)     2 weeks  4. CAUSE: "What do you think is causing the weakness or fatigue?" (e.g., not drinking enough  fluids, medical problem, trouble sleeping)     amlo 5. NEW MEDICINES:  "Have you started on any new medicines recently?" (e.g., opioid pain medicines, benzodiazepines, muscle relaxants, antidepressants, antihistamines, neuroleptics, beta blockers)     1 week prednisone 6. OTHER SYMPTOMS: "Do you have any other symptoms?" (e.g., chest pain, fever, cough, SOB, vomiting, diarrhea, bleeding, other areas of pain)     SOB with exertion 7. PREGNANCY: "Is there any chance you are pregnant?" "When was your last menstrual period?"     N/a  Protocols used: Weakness (Generalized) and Fatigue-A-AH

## 2022-05-07 NOTE — Telephone Encounter (Signed)
Called patient to ask her if she would like to be seen by any other provider before Friday. Unable to leave vm due to mailbox being full.

## 2022-05-08 DIAGNOSIS — I3481 Nonrheumatic mitral (valve) annulus calcification: Secondary | ICD-10-CM | POA: Diagnosis not present

## 2022-05-08 DIAGNOSIS — I272 Pulmonary hypertension, unspecified: Secondary | ICD-10-CM | POA: Diagnosis not present

## 2022-05-08 DIAGNOSIS — N183 Chronic kidney disease, stage 3 unspecified: Secondary | ICD-10-CM | POA: Diagnosis not present

## 2022-05-08 DIAGNOSIS — E1122 Type 2 diabetes mellitus with diabetic chronic kidney disease: Secondary | ICD-10-CM | POA: Diagnosis not present

## 2022-05-08 DIAGNOSIS — I493 Ventricular premature depolarization: Secondary | ICD-10-CM | POA: Diagnosis not present

## 2022-05-08 DIAGNOSIS — M7989 Other specified soft tissue disorders: Secondary | ICD-10-CM | POA: Diagnosis not present

## 2022-05-08 DIAGNOSIS — I129 Hypertensive chronic kidney disease with stage 1 through stage 4 chronic kidney disease, or unspecified chronic kidney disease: Secondary | ICD-10-CM | POA: Diagnosis not present

## 2022-05-09 ENCOUNTER — Ambulatory Visit (INDEPENDENT_AMBULATORY_CARE_PROVIDER_SITE_OTHER): Payer: Medicare Other | Admitting: Nurse Practitioner

## 2022-05-09 ENCOUNTER — Encounter: Payer: Self-pay | Admitting: Nurse Practitioner

## 2022-05-09 ENCOUNTER — Ambulatory Visit
Admission: RE | Admit: 2022-05-09 | Discharge: 2022-05-09 | Disposition: A | Discharge status: Home / Self Care | Payer: Medicare Other | Attending: Nurse Practitioner | Admitting: Nurse Practitioner

## 2022-05-09 ENCOUNTER — Ambulatory Visit
Admission: RE | Admit: 2022-05-09 | Discharge: 2022-05-09 | Disposition: A | Discharge status: Home / Self Care | Payer: Medicare Other | Source: Ambulatory Visit | Attending: Nurse Practitioner | Admitting: Nurse Practitioner

## 2022-05-09 VITALS — BP 100/59 | HR 83 | Temp 99.0°F | Ht 62.01 in | Wt 171.8 lb

## 2022-05-09 DIAGNOSIS — R0609 Other forms of dyspnea: Secondary | ICD-10-CM | POA: Diagnosis not present

## 2022-05-09 DIAGNOSIS — R531 Weakness: Secondary | ICD-10-CM | POA: Diagnosis not present

## 2022-05-09 DIAGNOSIS — R0600 Dyspnea, unspecified: Secondary | ICD-10-CM | POA: Diagnosis not present

## 2022-05-09 NOTE — Patient Instructions (Signed)
If blood pressure >130/80 consistently at home then may restart Amlodipine, but only 1/2 a pill.    DASH Eating Plan DASH stands for Dietary Approaches to Stop Hypertension. The DASH eating plan is a healthy eating plan that has been shown to: Reduce high blood pressure (hypertension). Reduce your risk for type 2 diabetes, heart disease, and stroke. Help with weight loss. What are tips for following this plan? Reading food labels Check food labels for the amount of salt (sodium) per serving. Choose foods with less than 5 percent of the Daily Value of sodium. Generally, foods with less than 300 milligrams (mg) of sodium per serving fit into this eating plan. To find whole grains, look for the word "whole" as the first word in the ingredient list. Shopping Buy products labeled as "low-sodium" or "no salt added." Buy fresh foods. Avoid canned foods and pre-made or frozen meals. Cooking Avoid adding salt when cooking. Use salt-free seasonings or herbs instead of table salt or sea salt. Check with your health care provider or pharmacist before using salt substitutes. Do not fry foods. Cook foods using healthy methods such as baking, boiling, grilling, roasting, and broiling instead. Cook with heart-healthy oils, such as olive, canola, avocado, soybean, or sunflower oil. Meal planning  Eat a balanced diet that includes: 4 or more servings of fruits and 4 or more servings of vegetables each day. Try to fill one-half of your plate with fruits and vegetables. 6-8 servings of whole grains each day. Less than 6 oz (170 g) of lean meat, poultry, or fish each day. A 3-oz (85-g) serving of meat is about the same size as a deck of cards. One egg equals 1 oz (28 g). 2-3 servings of low-fat dairy each day. One serving is 1 cup (237 mL). 1 serving of nuts, seeds, or beans 5 times each week. 2-3 servings of heart-healthy fats. Healthy fats called omega-3 fatty acids are found in foods such as walnuts,  flaxseeds, fortified milks, and eggs. These fats are also found in cold-water fish, such as sardines, salmon, and mackerel. Limit how much you eat of: Canned or prepackaged foods. Food that is high in trans fat, such as some fried foods. Food that is high in saturated fat, such as fatty meat. Desserts and other sweets, sugary drinks, and other foods with added sugar. Full-fat dairy products. Do not salt foods before eating. Do not eat more than 4 egg yolks a week. Try to eat at least 2 vegetarian meals a week. Eat more home-cooked food and less restaurant, buffet, and fast food. Lifestyle When eating at a restaurant, ask that your food be prepared with less salt or no salt, if possible. If you drink alcohol: Limit how much you use to: 0-1 drink a day for women who are not pregnant. 0-2 drinks a day for men. Be aware of how much alcohol is in your drink. In the [place] one drink equals one 12 oz bottle of beer (355 mL), one 5 oz glass of wine (148 mL), or one 1 oz glass of hard liquor (44 mL). General information Avoid eating more than 2,300 mg of salt a day. If you have hypertension, you may need to reduce your sodium intake to 1,500 mg a day. Work with your health care provider to maintain a healthy body weight or to lose weight. Ask what an ideal weight is for you. Get at least 30 minutes of exercise that causes your heart to beat faster (aerobic exercise) most days  of the week. Activities may include walking, swimming, or biking. Work with your health care provider or dietitian to adjust your eating plan to your individual calorie needs. What foods should I eat? Fruits All fresh, dried, or frozen fruit. Canned fruit in natural juice (without added sugar). Vegetables Fresh or frozen vegetables (raw, steamed, roasted, or grilled). Low-sodium or reduced-sodium tomato and vegetable juice. Low-sodium or reduced-sodium tomato sauce and tomato paste. Low-sodium or reduced-sodium canned  vegetables. Grains Whole-grain or whole-wheat bread. Whole-grain or whole-wheat pasta. Brown rice. Tina Mcdonald. Bulgur. Whole-grain and low-sodium cereals. Pita bread. Low-fat, low-sodium crackers. Whole-wheat flour tortillas. Meats and other proteins Skinless chicken or [place]. Ground chicken or [place]. Pork with fat trimmed off. Fish and seafood. Egg whites. Dried beans, peas, or lentils. Unsalted nuts, nut butters, and seeds. Unsalted canned beans. Lean cuts of beef with fat trimmed off. Low-sodium, lean precooked or cured meat, such as sausages or meat loaves. Dairy Low-fat (1%) or fat-free (skim) milk. Reduced-fat, low-fat, or fat-free cheeses. Nonfat, low-sodium ricotta or cottage cheese. Low-fat or nonfat yogurt. Low-fat, low-sodium cheese. Fats and oils Soft margarine without trans fats. Vegetable oil. Reduced-fat, low-fat, or light mayonnaise and salad dressings (reduced-sodium). Canola, safflower, olive, avocado, soybean, and sunflower oils. Avocado. Seasonings and condiments Herbs. Spices. Seasoning mixes without salt. Other foods Unsalted popcorn and pretzels. Fat-free sweets. The items listed above may not be a complete list of foods and beverages you can eat. Contact a dietitian for more information. What foods should I avoid? Fruits Canned fruit in a light or heavy syrup. Fried fruit. Fruit in cream or butter sauce. Vegetables Creamed or fried vegetables. Vegetables in a cheese sauce. Regular canned vegetables (not low-sodium or reduced-sodium). Regular canned tomato sauce and paste (not low-sodium or reduced-sodium). Regular tomato and vegetable juice (not low-sodium or reduced-sodium). Tina Mcdonald. Olives. Grains Baked goods made with fat, such as croissants, muffins, or some breads. Dry pasta or rice meal packs. Meats and other proteins Fatty cuts of meat. Ribs. Fried meat. Tina Mcdonald. Bologna, salami, and other precooked or cured meats, such as sausages or meat loaves. Fat from  the back of a pig (fatback). Bratwurst. Salted nuts and seeds. Canned beans with added salt. Canned or smoked fish. Whole eggs or egg yolks. Chicken or [place] with skin. Dairy Whole or 2% milk, cream, and half-and-half. Whole or full-fat cream cheese. Whole-fat or sweetened yogurt. Full-fat cheese. Nondairy creamers. Whipped toppings. Processed cheese and cheese spreads. Fats and oils Butter. Stick margarine. Lard. Shortening. Ghee. Bacon fat. Tropical oils, such as coconut, palm kernel, or palm oil. Seasonings and condiments Onion salt, garlic salt, seasoned salt, table salt, and sea salt. Worcestershire sauce. Tartar sauce. Barbecue sauce. Teriyaki sauce. Soy sauce, including reduced-sodium. Steak sauce. Canned and packaged gravies. Fish sauce. Oyster sauce. Cocktail sauce. Store-bought horseradish. Ketchup. Mustard. Meat flavorings and tenderizers. Bouillon cubes. Hot sauces. Pre-made or packaged marinades. Pre-made or packaged taco seasonings. Relishes. Regular salad dressings. Other foods Salted popcorn and pretzels. The items listed above may not be a complete list of foods and beverages you should avoid. Contact a dietitian for more information. Where to find more information National Heart, Lung, and Blood Institute: https://wilson-eaton.com/ American Heart Association: www.heart.org Academy of Nutrition and Dietetics: www.eatright.Tina Mcdonald: www.kidney.org Summary The DASH eating plan is a healthy eating plan that has been shown to reduce high blood pressure (hypertension). It may also reduce your risk for type 2 diabetes, heart disease, and stroke. When on the DASH eating plan, aim  to eat more fresh fruits and vegetables, whole grains, lean proteins, low-fat dairy, and heart-healthy fats. With the DASH eating plan, you should limit salt (sodium) intake to 2,300 mg a day. If you have hypertension, you may need to reduce your sodium intake to 1,500 mg a day. Work with your  health care provider or dietitian to adjust your eating plan to your individual calorie needs. This information is not intended to replace advice given to you by your health care provider. Make sure you discuss any questions you have with your health care provider. Document Revised: 05/13/2019 Document Reviewed: 05/13/2019 Elsevier Patient Education  [place].

## 2022-05-09 NOTE — Assessment & Plan Note (Signed)
Acute for two weeks exacerbated by her right knee pain, having some weakness with this as well.  Recommend she follow-up with ortho ASAP.  At this time EKG stable, but will get her back into cardiology -- referral placed.  Overall stable exam with no distress. - Obtain CXR - Labs CMP, CBC, BNP, TSH, iron, magnesium - Determine next steps after return of labs. Currently no acute distress, but if worsening over weekend advised ER visit.

## 2022-05-09 NOTE — Progress Notes (Signed)
BP (!) 100/59   Pulse 83   Temp 99 F (37.2 C) (Oral)   Ht 5' 2.01" (1.575 m)   Wt 171 lb 12.8 oz (77.9 kg)   LMP  (LMP Unknown)   SpO2 99%   BMI 31.41 kg/m    Subjective:    Patient ID: [name], female    DOB: [birth year], 81 y.o.   MRN: [id number]  HPI: TEKESHIA KLAHR is a 81 y.o. female  Chief Complaint  Patient presents with   Weakness   SOB with exertion   WEAKNESS AND SOB WITH EXERTION Symptoms started 2 weeks ago, she thought it was change in weather at first.  No recent illness, including Covid and flu.  When she walks or moves around in the house she gets tired quickly because she is breathing hard, even short distances in house.  She is having right knee pain -- saw ortho last 04/18/22.  She feels the SOB and weakness is correlated to her knee pain -- taking Tylenol Arthritis.  Denies any CP.   At night sleeps on one pillow, no orthopnea.    Has Amlodipine, Carvedilol, Irbesartan.  Had been out of Amlodipine, but restarted today.    Had echo yesterday via nephrology at [facility], as she reported SOB to them.  No report available yet.  Last echo on 05/22/21 - noted EF 60-65%, left ventricle normal function but mild hypertrophy, Grade I diastolic -- right and left atrium with mild dilation, mild mitral valve regurgitation and mild/moderate tricuspid valve regurgitation. Time since onset: 2 weeks Duration:weeks Onset: gradual Frequency: intermittent Related to exertion: yes Activity when pain started: unknown Trauma: no Anxiety/recent stressors: no Aggravating factors: activity  Alleviating factors: resting Status: fluctuating Treatments attempted: nothing  Shortness of breath: yes  Cough:  occasional Nausea: no Diaphoresis: no Heartburn: no Palpitations: yes   Relevant past medical, surgical, family and social history reviewed and updated as indicated. Interim medical history since our last visit reviewed. Allergies and medications reviewed and  updated.  Review of Systems  Constitutional:  Negative for activity change, appetite change, diaphoresis, fatigue and fever.  Respiratory:  Negative for cough, chest tightness, shortness of breath and wheezing.   Cardiovascular:  Negative for chest pain, palpitations and leg swelling.  Gastrointestinal: Negative.   Endocrine: Negative for cold intolerance, heat intolerance, polydipsia, polyphagia and polyuria.  Neurological: Negative.   Psychiatric/Behavioral: Negative.      Per HPI unless specifically indicated above     Objective:    BP (!) 100/59   Pulse 83   Temp 99 F (37.2 C) (Oral)   Ht 5' 2.01" (1.575 m)   Wt 171 lb 12.8 oz (77.9 kg)   LMP  (LMP Unknown)   SpO2 99%   BMI 31.41 kg/m   Wt Readings from Last 3 Encounters:  05/09/22 171 lb 12.8 oz (77.9 kg)  03/05/22 170 lb 6.4 oz (77.3 kg)  02/05/22 169 lb 4.8 oz (76.8 kg)    Physical Exam Vitals and nursing note reviewed.  Constitutional:      General: She is awake. She is not in acute distress.    Appearance: She is well-developed and well-groomed. She is obese. She is not ill-appearing or toxic-appearing.  HENT:     Head: Normocephalic.     Right Ear: Hearing normal.     Left Ear: Hearing normal.  Eyes:     General: Lids are normal.        Right eye: No discharge.  Left eye: No discharge.     Conjunctiva/sclera: Conjunctivae normal.     Pupils: Pupils are equal, round, and reactive to light.  Neck:     Thyroid: No thyromegaly.     Vascular: No carotid bruit.  Cardiovascular:     Rate and Rhythm: Normal rate and regular rhythm.     Heart sounds: Murmur heard.     Systolic murmur is present with a grade of 2/6.     No gallop.  Pulmonary:     Effort: Pulmonary effort is normal. No accessory muscle usage or respiratory distress.     Breath sounds: Normal breath sounds.  Abdominal:     General: Bowel sounds are normal. There is no distension.     Palpations: Abdomen is soft.     Tenderness: There  is no abdominal tenderness.  Musculoskeletal:     Cervical back: Normal range of motion and neck supple.     Right lower leg: 1+ Edema present.     Left lower leg: 1+ Edema present.  Lymphadenopathy:     Cervical: No cervical adenopathy.  Skin:    General: Skin is warm and dry.  Neurological:     Mental Status: She is alert and oriented to person, place, and time.  Psychiatric:        Attention and Perception: Attention normal.        Mood and Affect: Mood normal.        Speech: Speech normal.        Behavior: Behavior normal. Behavior is cooperative.        Thought Content: Thought content normal.    EKG My review and personal interpretation at Time: 0930 Indication: SBE  Rate: 80  Rhythm: sinus Axis: normal Other: no nonspecific st abn, no stemi, no lvh -- PVCs present  Results for orders placed or performed in visit on 03/05/22  Bayer DCA Hb A1c Waived  Result Value Ref Range   HB A1C (BAYER DCA - WAIVED) 7.4 (H) 4.8 - 5.6 %  Lipid Panel w/o Chol/HDL Ratio  Result Value Ref Range   Cholesterol, Total 214 (H) 100 - 199 mg/dL   Triglycerides 92 0 - 149 mg/dL   HDL 73 >39 mg/dL   VLDL Cholesterol Cal 16 5 - 40 mg/dL   LDL Chol Calc (NIH) 125 (H) 0 - 99 mg/dL      Assessment & Plan:   Problem List Items Addressed This Visit       Other   Dyspnea on exertion - Primary    Acute for two weeks exacerbated by her right knee pain, having some weakness with this as well.  Recommend she follow-up with ortho ASAP.  At this time EKG stable, but will get her back into cardiology -- referral placed.  Overall stable exam with no distress. - Obtain CXR - Labs CMP, CBC, BNP, TSH, iron, magnesium - Determine next steps after return of labs. Currently no acute distress, but if worsening over weekend advised ER visit.      Relevant Orders   CBC with Differential/Platelet   Comprehensive metabolic panel   TSH   Vitamin B12   Magnesium   B Nat Peptide   Iron Binding Cap  (TIBC)([facility])   DG Chest 2 View   EKG 12-Lead (Completed)   Ambulatory referral to Cardiology     Follow up plan: Return for as scheduled December 13th.

## 2022-05-10 LAB — CBC WITH DIFFERENTIAL/PLATELET
Basophils Absolute: 0 10*3/uL (ref 0.0–0.2)
Basos: 0 %
EOS (ABSOLUTE): 0.2 10*3/uL (ref 0.0–0.4)
Eos: 3 %
Hematocrit: 36.3 % (ref 34.0–46.6)
Hemoglobin: 11.3 g/dL (ref 11.1–15.9)
Immature Grans (Abs): 0 10*3/uL (ref 0.0–0.1)
Immature Granulocytes: 0 %
Lymphocytes Absolute: 1.5 10*3/uL (ref 0.7–3.1)
Lymphs: 27 %
MCH: 26 pg — ABNORMAL LOW (ref 26.6–33.0)
MCHC: 31.1 g/dL — ABNORMAL LOW (ref 31.5–35.7)
MCV: 83 fL (ref 79–97)
Monocytes Absolute: 0.6 10*3/uL (ref 0.1–0.9)
Monocytes: 10 %
Neutrophils Absolute: 3.1 10*3/uL (ref 1.4–7.0)
Neutrophils: 60 %
Platelets: 235 10*3/uL (ref 150–450)
RBC: 4.35 x10E6/uL (ref 3.77–5.28)
RDW: 12.8 % (ref 11.7–15.4)
WBC: 5.3 10*3/uL (ref 3.4–10.8)

## 2022-05-10 LAB — IRON AND TIBC
Iron Saturation: 25 % (ref 15–55)
Iron: 54 ug/dL (ref 27–139)
Total Iron Binding Capacity: 218 ug/dL — ABNORMAL LOW (ref 250–450)
UIBC: 164 ug/dL (ref 118–369)

## 2022-05-10 LAB — COMPREHENSIVE METABOLIC PANEL
ALT: 8 IU/L (ref 0–32)
AST: 15 IU/L (ref 0–40)
Albumin/Globulin Ratio: 1.6 (ref 1.2–2.2)
Albumin: 4.1 g/dL (ref 3.8–4.8)
Alkaline Phosphatase: 58 IU/L (ref 44–121)
BUN/Creatinine Ratio: 18 (ref 12–28)
BUN: 23 mg/dL (ref 8–27)
Bilirubin Total: 0.2 mg/dL (ref 0.0–1.2)
CO2: 21 mmol/L (ref 20–29)
Calcium: 9.8 mg/dL (ref 8.7–10.3)
Chloride: 105 mmol/L (ref 96–106)
Creatinine, Ser: 1.29 mg/dL — ABNORMAL HIGH (ref 0.57–1.00)
Globulin, Total: 2.5 g/dL (ref 1.5–4.5)
Glucose: 122 mg/dL — ABNORMAL HIGH (ref 70–99)
Potassium: 4.1 mmol/L (ref 3.5–5.2)
Sodium: 143 mmol/L (ref 134–144)
Total Protein: 6.6 g/dL (ref 6.0–8.5)
eGFR: 42 mL/min/{1.73_m2} — ABNORMAL LOW (ref >59)

## 2022-05-10 LAB — MAGNESIUM: Magnesium: 1.7 mg/dL (ref 1.6–2.3)

## 2022-05-10 LAB — BRAIN NATRIURETIC PEPTIDE: BNP: 46.5 pg/mL (ref 0.0–100.0)

## 2022-05-10 LAB — VITAMIN B12: Vitamin B-12: 223 pg/mL — ABNORMAL LOW (ref 232–1245)

## 2022-05-10 LAB — TSH: TSH: 0.866 u[IU]/mL (ref 0.450–4.500)

## 2022-05-11 NOTE — Progress Notes (Signed)
Contacted via [facility] morning Dusty, let me know if you receive this message.  You can MyChart me.  Your labs have returned and overall these are stable, including BNP which looks for heart failure.  Although, please still get your chest x-ray.  Kidney function, creatinine and eGFR, remains at baseline for you -- mildly lower then last check however.  Ensure you are taking in good water daily.  Your B12 level is very low again, at <300.  Please take your B12 supplement by mouth every other day -- 1000 MCG.  Any questions? Keep being amazing!!  Thank you for allowing me to participate in your care.  I appreciate you. Kindest regards, Caldwell Kronenberger

## 2022-05-12 NOTE — Progress Notes (Signed)
Contacted via MyChart   Overall chest x-ray is not showing any acute issues, such as pneumonia or heart failure.  If ongoing shortness of breath we may get you into pulmonary in future.

## 2022-05-26 DIAGNOSIS — M791 Myalgia, unspecified site: Secondary | ICD-10-CM | POA: Diagnosis not present

## 2022-05-26 DIAGNOSIS — I429 Cardiomyopathy, unspecified: Secondary | ICD-10-CM | POA: Diagnosis not present

## 2022-05-26 DIAGNOSIS — I2089 Other forms of angina pectoris: Secondary | ICD-10-CM | POA: Diagnosis not present

## 2022-05-26 DIAGNOSIS — E119 Type 2 diabetes mellitus without complications: Secondary | ICD-10-CM | POA: Diagnosis not present

## 2022-05-26 DIAGNOSIS — K219 Gastro-esophageal reflux disease without esophagitis: Secondary | ICD-10-CM | POA: Diagnosis not present

## 2022-05-26 DIAGNOSIS — M792 Neuralgia and neuritis, unspecified: Secondary | ICD-10-CM | POA: Diagnosis not present

## 2022-05-26 DIAGNOSIS — R0602 Shortness of breath: Secondary | ICD-10-CM | POA: Diagnosis not present

## 2022-05-26 DIAGNOSIS — G4730 Sleep apnea, unspecified: Secondary | ICD-10-CM | POA: Diagnosis not present

## 2022-05-26 DIAGNOSIS — I10 Essential (primary) hypertension: Secondary | ICD-10-CM | POA: Diagnosis not present

## 2022-05-27 ENCOUNTER — Ambulatory Visit: Payer: Self-pay

## 2022-05-27 ENCOUNTER — Other Ambulatory Visit: Payer: Self-pay | Admitting: Internal Medicine

## 2022-05-27 DIAGNOSIS — I2089 Other forms of angina pectoris: Secondary | ICD-10-CM

## 2022-05-27 DIAGNOSIS — R0602 Shortness of breath: Secondary | ICD-10-CM

## 2022-05-27 NOTE — Telephone Encounter (Signed)
Pt has medication questions regarding Metoprolol, she says that another provider other than Jolene told her to take it for her upcoming imaging with contrast.   Chief Complaint: Pt. States cardiology told her yesterday she will need to take metoprolol 06/09/22 for her procedure. Pt. Is confused about this and would like PCP to clarify for her. Symptoms: n/a Frequency: n/a Pertinent Negatives: Patient denies  Disposition: '[]'$ ED /'[]'$ Urgent Care (no appt availability in office) / '[]'$ Appointment(In office/virtual)/ '[]'$  [facility]/ '[]'$ Home Care/ '[]'$ Refused Recommended Disposition /'[]'$ [facility]/ '[x]'$  Follow-up with PCP Additional Notes:   Answer Assessment - Initial Assessment Questions 1. NAME of MEDICINE: "What medicine(s) are you calling about?"     Metoprolol  2. QUESTION: "What is your question?" (e.g., double dose of medicine, side effect)     The doctor yesterday said I need to take metoprolol  3. PRESCRIBER: "Who prescribed the medicine?" Reason: if prescribed by specialist, call should be referred to that group.     N/a 4. SYMPTOMS: "Do you have any symptoms?" If Yes, ask: "What symptoms are you having?"  "How bad are the symptoms (e.g., mild, moderate, severe)     N/a 5. PREGNANCY:  "Is there any chance that you are pregnant?" "When was your last menstrual period?"     No  Protocols used: Medication Question Call-A-AH

## 2022-05-28 NOTE — Telephone Encounter (Signed)
Patient called to follow up on message left for provider. Patient notified of provider comment- she will follow up with cariology for verification.

## 2022-05-29 DIAGNOSIS — I2089 Other forms of angina pectoris: Secondary | ICD-10-CM | POA: Diagnosis not present

## 2022-05-29 DIAGNOSIS — R0602 Shortness of breath: Secondary | ICD-10-CM | POA: Diagnosis not present

## 2022-06-04 ENCOUNTER — Ambulatory Visit: Payer: Medicare Other | Admitting: Nurse Practitioner

## 2022-06-05 ENCOUNTER — Telehealth (HOSPITAL_COMMUNITY): Payer: Self-pay | Admitting: Emergency Medicine

## 2022-06-05 DIAGNOSIS — R079 Chest pain, unspecified: Secondary | ICD-10-CM

## 2022-06-05 MED ORDER — METOPROLOL TARTRATE 100 MG PO TABS: 100.0000 mg | ORAL_TABLET | Freq: Once | ORAL | 0 refills | Status: DC

## 2022-06-05 NOTE — Telephone Encounter (Signed)
Attempted to call patient regarding upcoming cardiac CT appointment. °Left message on voicemail with name and callback number °Tameisha Covell RN Navigator Cardiac Imaging °[facility] °[phone] Office °[phone] Cell ° °

## 2022-06-05 NOTE — Telephone Encounter (Signed)
Reaching out to patient to offer assistance regarding upcoming cardiac imaging study; pt verbalizes understanding of appt date/time, parking situation and where to check in, pre-test NPO status and medications ordered, and verified current allergies; name and call back number provided for further questions should they arise Marchia Bond RN Navigator Cardiac Imaging [facility] [phone] office [phone] cell   Arrival 730 '100mg'$  metoprolol  + 13hr prep Denies iv issues

## 2022-06-08 NOTE — Patient Instructions (Signed)
Diabetes Mellitus Basics  Diabetes mellitus, or diabetes, is a long-term (chronic) disease. It occurs when the body does not properly use sugar (glucose) that is released from food after you eat. Diabetes mellitus may be caused by one or both of these problems: Your pancreas does not make enough of a hormone called insulin. Your body does not react in a normal way to the insulin that it makes. Insulin lets glucose enter cells in your body. This gives you energy. If you have diabetes, glucose cannot get into cells. This causes high blood glucose (hyperglycemia). How to treat and manage diabetes You may need to take insulin or other diabetes medicines daily to keep your glucose in balance. If you are prescribed insulin, you will learn how to give yourself insulin by injection. You may need to adjust the amount of insulin you take based on the foods that you eat. You will need to check your blood glucose levels using a glucose monitor as told by your health care provider. The readings can help determine if you have low or high blood glucose. Generally, you should have these blood glucose levels: Before meals (preprandial): 80-130 mg/dL (4.4-7.2 mmol/L). After meals (postprandial): below 180 mg/dL (10 mmol/L). Hemoglobin A1c (HbA1c) level: less than 7%. Your health care provider will set treatment goals for you. Keep all follow-up visits. This is important. Follow these instructions at home: Diabetes medicines Take your diabetes medicines every day as told by your health care provider. List your diabetes medicines here: Name of medicine: ______________________________ Amount (dose): _______________ Time (a.m./p.m.): _______________ Notes: ___________________________________ Name of medicine: ______________________________ Amount (dose): _______________ Time (a.m./p.m.): _______________ Notes: ___________________________________ Name of medicine: ______________________________ Amount (dose):  _______________ Time (a.m./p.m.): _______________ Notes: ___________________________________ Insulin If you use insulin, list the types of insulin you use here: Insulin type: ______________________________ Amount (dose): _______________ Time (a.m./p.m.): _______________Notes: ___________________________________ Insulin type: ______________________________ Amount (dose): _______________ Time (a.m./p.m.): _______________ Notes: ___________________________________ Insulin type: ______________________________ Amount (dose): _______________ Time (a.m./p.m.): _______________ Notes: ___________________________________ Insulin type: ______________________________ Amount (dose): _______________ Time (a.m./p.m.): _______________ Notes: ___________________________________ Insulin type: ______________________________ Amount (dose): _______________ Time (a.m./p.m.): _______________ Notes: ___________________________________ Managing blood glucose  Check your blood glucose levels using a glucose monitor as told by your health care provider. Write down the times that you check your glucose levels here: Time: _______________ Notes: ___________________________________ Time: _______________ Notes: ___________________________________ Time: _______________ Notes: ___________________________________ Time: _______________ Notes: ___________________________________ Time: _______________ Notes: ___________________________________ Time: _______________ Notes: ___________________________________  Low blood glucose Low blood glucose (hypoglycemia) is when glucose is at or below 70 mg/dL (3.9 mmol/L). Symptoms may include: Feeling: Hungry. Sweaty and clammy. Irritable or easily upset. Dizzy. Sleepy. Having: A fast heartbeat. A headache. A change in your vision. Numbness around the mouth, lips, or tongue. Having trouble with: Moving (coordination). Sleeping. Treating low blood glucose To treat low blood  glucose, eat or drink something containing sugar right away. If you can think clearly and swallow safely, follow the 15:15 rule: Take 15 grams of a fast-acting carb (carbohydrate), as told by your health care provider. Some fast-acting carbs are: Glucose tablets: take 3-4 tablets. Hard candy: eat 3-5 pieces. Fruit juice: drink 4 oz (120 mL). Regular (not diet) soda: drink 4-6 oz (120-180 mL). Honey or sugar: eat 1 Tbsp (15 mL). Check your blood glucose levels 15 minutes after you take the carb. If your glucose is still at or below 70 mg/dL (3.9 mmol/L), take 15 grams of a carb again. If your glucose does not go above 70 mg/dL (3.9 mmol/L) after   3 tries, get help right away. After your glucose goes back to normal, eat a meal or a snack within 1 hour. Treating very low blood glucose If your glucose is at or below 54 mg/dL (3 mmol/L), you have very low blood glucose (severe hypoglycemia). This is an emergency. Do not wait to see if the symptoms will go away. Get medical help right away. Call your local emergency services (911 in the [place] Do not drive yourself to the hospital. Questions to ask your health care provider Should I talk with a diabetes educator? What equipment will I need to care for myself at home? What diabetes medicines do I need? When should I take them? How often do I need to check my blood glucose levels? What number can I call if I have questions? When is my follow-up visit? Where can I find a support group for people with diabetes? Where to find more information American Diabetes Association: www.diabetes.org Association of Diabetes Care and Education Specialists: www.diabeteseducator.org Contact a health care provider if: Your blood glucose is at or above 240 mg/dL (13.3 mmol/L) for 2 days in a row. You have been sick or have had a fever for 2 days or more, and you are not getting better. You have any of these problems for more than 6 hours: You cannot eat or  drink. You feel nauseous. You vomit. You have diarrhea. Get help right away if: Your blood glucose is lower than 54 mg/dL (3 mmol/L). You get confused. You have trouble thinking clearly. You have trouble breathing. These symptoms may represent a serious problem that is an emergency. Do not wait to see if the symptoms will go away. Get medical help right away. Call your local emergency services (911 in the [place] Do not drive yourself to the hospital. Summary Diabetes mellitus is a chronic disease that occurs when the body does not properly use sugar (glucose) that is released from food after you eat. Take insulin and diabetes medicines as told. Check your blood glucose every day, as often as told. Keep all follow-up visits. This is important. This information is not intended to replace advice given to you by your health care provider. Make sure you discuss any questions you have with your health care provider. Document Revised: 10/11/2019 Document Reviewed: 10/11/2019 Elsevier Patient Education  2023 Elsevier Inc.  

## 2022-06-09 ENCOUNTER — Ambulatory Visit
Admission: RE | Admit: 2022-06-09 | Discharge: 2022-06-09 | Disposition: A | Discharge status: Home / Self Care | Payer: Medicare Other | Source: Ambulatory Visit | Attending: Internal Medicine | Admitting: Internal Medicine

## 2022-06-09 DIAGNOSIS — R0602 Shortness of breath: Secondary | ICD-10-CM | POA: Insufficient documentation

## 2022-06-09 DIAGNOSIS — I2089 Other forms of angina pectoris: Secondary | ICD-10-CM | POA: Diagnosis not present

## 2022-06-09 MED ORDER — METOPROLOL TARTRATE 5 MG/5ML IV SOLN
INTRAVENOUS | Status: AC
  Filled 2022-06-09: qty 10

## 2022-06-09 MED ORDER — DIPHENHYDRAMINE HCL 50 MG/ML IJ SOLN
INTRAMUSCULAR | Status: AC
  Filled 2022-06-09: qty 1

## 2022-06-09 MED ORDER — DILTIAZEM HCL 25 MG/5ML IV SOLN: 10.0000 mg | Freq: Once | INTRAVENOUS | Status: AC

## 2022-06-09 MED ORDER — DIPHENHYDRAMINE HCL 50 MG PO CAPS: 50.0000 mg | ORAL_CAPSULE | Freq: Once | ORAL | Status: DC

## 2022-06-09 MED ORDER — DILTIAZEM HCL 25 MG/5ML IV SOLN
INTRAVENOUS | Status: AC
  Filled 2022-06-09: qty 5

## 2022-06-09 MED ORDER — METOPROLOL TARTRATE 5 MG/5ML IV SOLN
10.0000 mg | Freq: Once | INTRAVENOUS | Status: AC
  Administered 2022-06-09: 10 mg via INTRAVENOUS
  Filled 2022-06-09: qty 10

## 2022-06-09 MED ORDER — DILTIAZEM HCL 25 MG/5ML IV SOLN
10.0000 mg | Freq: Once | INTRAVENOUS | Status: AC
  Administered 2022-06-09: 10 mg via INTRAVENOUS

## 2022-06-09 MED ORDER — NITROGLYCERIN 0.4 MG SL SUBL
0.8000 mg | SUBLINGUAL_TABLET | Freq: Once | SUBLINGUAL | Status: AC
  Filled 2022-06-09: qty 25

## 2022-06-09 MED ORDER — DIPHENHYDRAMINE HCL 50 MG/ML IJ SOLN: 50.0000 mg | Freq: Once | INTRAMUSCULAR | Status: DC

## 2022-06-09 MED ORDER — DIPHENHYDRAMINE HCL 25 MG PO CAPS
25.0000 mg | ORAL_CAPSULE | Freq: Once | ORAL | Status: AC
  Administered 2022-06-09: 25 mg via ORAL
  Filled 2022-06-09: qty 1

## 2022-06-09 MED ORDER — METOPROLOL TARTRATE 5 MG/5ML IV SOLN
INTRAVENOUS | Status: AC
  Administered 2022-06-09: 10 mg via INTRAVENOUS
  Filled 2022-06-09: qty 10

## 2022-06-09 MED ORDER — IOHEXOL 350 MG/ML SOLN
100.0000 mL | Freq: Once | INTRAVENOUS | Status: AC | PRN
  Administered 2022-06-09: 100 mL via INTRAVENOUS

## 2022-06-09 MED ORDER — PREDNISONE 50 MG PO TABS
50.0000 mg | ORAL_TABLET | Freq: Four times a day (QID) | ORAL | Status: DC
  Filled 2022-06-09: qty 1

## 2022-06-09 MED ORDER — DILTIAZEM HCL 25 MG/5ML IV SOLN
INTRAVENOUS | Status: AC
  Administered 2022-06-09: 10 mg via INTRAVENOUS
  Filled 2022-06-09: qty 5

## 2022-06-09 MED ORDER — NITROGLYCERIN 0.4 MG SL SUBL
SUBLINGUAL_TABLET | SUBLINGUAL | Status: AC
  Administered 2022-06-09: 0.8 mg via SUBLINGUAL
  Filled 2022-06-09: qty 2

## 2022-06-09 NOTE — Progress Notes (Signed)
Patient tolerated CT well.  Vital signs stable encourage to drink water throughout day.Reasons explained and verbalized understanding.   

## 2022-06-10 ENCOUNTER — Ambulatory Visit (INDEPENDENT_AMBULATORY_CARE_PROVIDER_SITE_OTHER): Payer: Medicare Other | Admitting: Nurse Practitioner

## 2022-06-10 ENCOUNTER — Encounter: Payer: Self-pay | Admitting: Nurse Practitioner

## 2022-06-10 VITALS — BP 120/69 | HR 82 | Temp 97.9°F | Ht 62.01 in | Wt 172.4 lb

## 2022-06-10 DIAGNOSIS — E1159 Type 2 diabetes mellitus with other circulatory complications: Secondary | ICD-10-CM | POA: Diagnosis not present

## 2022-06-10 DIAGNOSIS — E1169 Type 2 diabetes mellitus with other specified complication: Secondary | ICD-10-CM | POA: Diagnosis not present

## 2022-06-10 DIAGNOSIS — I340 Nonrheumatic mitral (valve) insufficiency: Secondary | ICD-10-CM | POA: Diagnosis not present

## 2022-06-10 DIAGNOSIS — E1122 Type 2 diabetes mellitus with diabetic chronic kidney disease: Secondary | ICD-10-CM

## 2022-06-10 DIAGNOSIS — Z8739 Personal history of other diseases of the musculoskeletal system and connective tissue: Secondary | ICD-10-CM

## 2022-06-10 DIAGNOSIS — G4733 Obstructive sleep apnea (adult) (pediatric): Secondary | ICD-10-CM

## 2022-06-10 DIAGNOSIS — N1831 Chronic kidney disease, stage 3a: Secondary | ICD-10-CM | POA: Diagnosis not present

## 2022-06-10 DIAGNOSIS — I700 Atherosclerosis of aorta: Secondary | ICD-10-CM | POA: Diagnosis not present

## 2022-06-10 DIAGNOSIS — E1149 Type 2 diabetes mellitus with other diabetic neurological complication: Secondary | ICD-10-CM

## 2022-06-10 DIAGNOSIS — E538 Deficiency of other specified B group vitamins: Secondary | ICD-10-CM

## 2022-06-10 DIAGNOSIS — I152 Hypertension secondary to endocrine disorders: Secondary | ICD-10-CM

## 2022-06-10 DIAGNOSIS — D631 Anemia in chronic kidney disease: Secondary | ICD-10-CM

## 2022-06-10 DIAGNOSIS — E785 Hyperlipidemia, unspecified: Secondary | ICD-10-CM

## 2022-06-10 DIAGNOSIS — I517 Cardiomegaly: Secondary | ICD-10-CM

## 2022-06-10 DIAGNOSIS — R0609 Other forms of dyspnea: Secondary | ICD-10-CM

## 2022-06-10 DIAGNOSIS — N183 Chronic kidney disease, stage 3 unspecified: Secondary | ICD-10-CM

## 2022-06-10 LAB — BAYER DCA HB A1C WAIVED: HB A1C (BAYER DCA - WAIVED): 6.5 % — ABNORMAL HIGH (ref 4.8–5.6)

## 2022-06-10 NOTE — Assessment & Plan Note (Signed)
Chronic, ongoing. Unable to take statin due to rhabdo with associated AKI. Continue taking Praulent at this time.  Lipid panel today.  Return in 3 months.

## 2022-06-10 NOTE — Progress Notes (Signed)
BP 120/69   Pulse 82   Temp 97.9 F (36.6 C) (Oral)   Ht 5' 2.01" (1.575 m)   Wt 172 lb 6.4 oz (78.2 kg)   LMP  (LMP Unknown)   SpO2 98%   BMI 31.52 kg/m    Subjective:    Patient ID: Tina Mcdonald, female    DOB: [birth year], 81 y.o.   MRN: [id number]  HPI: Tina Mcdonald is a 81 y.o. female  Chief Complaint  Patient presents with   Diabetes   Hypertension   Hyperlipidemia   Chronic Kidney Disease   Anemia   DIABETES Last A1c check was 7.4% due to start steroid injections.  No current medications for diabetes, diet focus.  Previously took Metformin.  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   Hypoglycemic episodes:no Polydipsia/polyuria: no Visual disturbance: no Chest pain: no Paresthesias: no Glucose Monitoring: yes             Accucheck frequency: occasional             Fasting glucose: 110 range, had a couple 150             Post prandial:             Evening:             Before meals: Taking Insulin?: no             Long acting insulin:             Short acting insulin: Blood Pressure Monitoring: occasional Retinal Examination: Up To Date Foot Exam: Up to Date Pneumovax: Up to Date Influenza: Up to Date Aspirin: no   HYPERTENSION / HYPERLIPIDEMIA Continues on Amlodipine, Carvedilol, Chlorthalidone, Avapro.  Last visit with cardiology 05/26/22 -- did CT cardiac yesterday. History of aortic atherosclerosis noted on past CT 01/26/17.  Has underlying OSA but could never afford CPAP  Statin reaction in past that led to admission. Currently taking Praluent.  Last echo was 05/08/22 last with mild LVH, EF >70%, mild mitral valve regurgitation, mild pulmonary hypertension + idiopathic hypotrophic subaortic stenosis -- is referring to Dr. Mina Marble at [facility]. Satisfied with current treatment? yes Duration of hypertension: chronic BP monitoring frequency: once a week BP range: 120/70 range BP medication side effects: no Duration of hyperlipidemia:  chronic Cholesterol medication side effects: no Cholesterol supplements: none Medication compliance: good compliance Aspirin: no Recent stressors: no Recurrent headaches: no Visual changes: no Palpitations: no Dyspnea: all the time -- baseline, no worsening Chest pain: no Lower extremity edema: yes Dizzy/lightheaded: no   CHRONIC KIDNEY DISEASE Followed by nephrology.  Last visit on 04/30/22 and next visit 08/26/22.  CKD status: stable Medications renally dose: yes Previous renal evaluation: yes Pneumovax:  Up to Date Influenza Vaccine:  Up to Date   ANEMIA Visited with hematology on 01/27/22, recommended she continue her Slow Fe every other day.  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 Anemia status: controlled Compliance with treatment: good compliance Iron supplementation side effects: no Severity of anemia: mild Fatigue: yes  Decreased exercise tolerance: no  Dyspnea on exertion: no Palpitations: no Bleeding: no Pica: no      06/10/2022    9:44 AM 05/09/2022    8:53 AM 03/31/2022   12:28 PM 03/05/2022   11:04 AM 02/05/2022    4:18 PM  Depression screen PHQ 2/9  Decreased Interest 0 1 0 0 0  Down, Depressed, Hopeless 0 0 0 0 0  PHQ - 2  Score 0 1 0 0 0  Altered sleeping 0 0 0 0 1  Tired, decreased energy 0 2 0 1 1  Change in appetite 0 0 0 1 1  Feeling bad or failure about yourself  0 0 0 0 0  Trouble concentrating 0 0 0 0 0  Moving slowly or fidgety/restless 0 0 0 0 0  Suicidal thoughts 0 0 0 0 0  PHQ-9 Score 0 3 0 2 3  Difficult doing work/chores Not difficult at all Not difficult at all Not difficult at all Not difficult at all Not difficult at all       06/10/2022    9:44 AM 05/09/2022    8:53 AM 03/05/2022   11:04 AM 02/05/2022    4:18 PM  GAD 7 : Generalized Anxiety Score  Nervous, Anxious, on Edge 0 0 0 0  Control/stop worrying 0 0 0 0  Worry too much - different things 0 0 0 0  Trouble relaxing 0 0 0 0  Restless 0 0 0 0  Easily annoyed or irritable 0 0  0 0  Afraid - awful might happen 0 0 0 0  Total GAD 7 Score 0 0 0 0  Anxiety Difficulty Not difficult at all Not difficult at all Not difficult at all Not difficult at all   Relevant past medical, surgical, family and social history reviewed and updated as indicated. Interim medical history since our last visit reviewed. Allergies and medications reviewed and updated.  Review of Systems  Constitutional:  Negative for activity change, appetite change, diaphoresis, fatigue and fever.  Respiratory:  Positive for shortness of breath. Negative for cough, chest tightness and wheezing.   Cardiovascular:  Positive for leg swelling. Negative for chest pain and palpitations.  Gastrointestinal: Negative.   Endocrine: Negative for cold intolerance, heat intolerance, polydipsia, polyphagia and polyuria.  Neurological: Negative.   Psychiatric/Behavioral: Negative.     Per HPI unless specifically indicated above     Objective:    BP 120/69   Pulse 82   Temp 97.9 F (36.6 C) (Oral)   Ht 5' 2.01" (1.575 m)   Wt 172 lb 6.4 oz (78.2 kg)   LMP  (LMP Unknown)   SpO2 98%   BMI 31.52 kg/m   Wt Readings from Last 3 Encounters:  06/10/22 172 lb 6.4 oz (78.2 kg)  05/09/22 171 lb 12.8 oz (77.9 kg)  03/05/22 170 lb 6.4 oz (77.3 kg)    Physical Exam Vitals and nursing note reviewed.  Constitutional:      General: She is awake. She is not in acute distress.    Appearance: She is well-developed and well-groomed. She is obese. She is not ill-appearing or toxic-appearing.  HENT:     Head: Normocephalic.     Right Ear: Hearing normal.     Left Ear: Hearing normal.  Eyes:     General: Lids are normal.        Right eye: No discharge.        Left eye: No discharge.     Conjunctiva/sclera: Conjunctivae normal.     Pupils: Pupils are equal, round, and reactive to light.  Neck:     Thyroid: No thyromegaly.     Vascular: No carotid bruit.  Cardiovascular:     Rate and Rhythm: Normal rate and regular  rhythm.     Heart sounds: Murmur heard.     Systolic murmur is present with a grade of 2/6.     No gallop.  Pulmonary:     Effort: Pulmonary effort is normal. No accessory muscle usage or respiratory distress.     Breath sounds: Normal breath sounds.  Abdominal:     General: Bowel sounds are normal.     Palpations: Abdomen is soft. There is no hepatomegaly or splenomegaly.  Musculoskeletal:     Cervical back: Normal range of motion and neck supple.     Right lower leg: 1+ Edema present.     Left lower leg: 1+ Edema present.  Lymphadenopathy:     Cervical: No cervical adenopathy.  Skin:    General: Skin is warm and dry.  Neurological:     Mental Status: She is alert and oriented to person, place, and time.  Psychiatric:        Attention and Perception: Attention normal.        Mood and Affect: Mood normal.        Speech: Speech normal.        Behavior: Behavior normal. Behavior is cooperative.        Thought Content: Thought content normal.    Diabetic Foot Exam - Simple   Simple Foot Form Visual Inspection No deformities, no ulcerations, no other skin breakdown bilaterally: Yes Sensation Testing Intact to touch and monofilament testing bilaterally: Yes Pulse Check Posterior Tibialis and Dorsalis pulse intact bilaterally: Yes Comments    Results for orders placed or performed in visit on 05/09/22  CBC with Differential/Platelet  Result Value Ref Range   WBC 5.3 3.4 - 10.8 x10E3/uL   RBC 4.35 3.77 - 5.28 x10E6/uL   Hemoglobin 11.3 11.1 - 15.9 g/dL   Hematocrit 36.3 34.0 - 46.6 %   MCV 83 79 - 97 fL   MCH 26.0 (L) 26.6 - 33.0 pg   MCHC 31.1 (L) 31.5 - 35.7 g/dL   RDW 12.8 11.7 - 15.4 %   Platelets 235 150 - 450 x10E3/uL   Neutrophils 60 Not Estab. %   Lymphs 27 Not Estab. %   Monocytes 10 Not Estab. %   Eos 3 Not Estab. %   Basos 0 Not Estab. %   Neutrophils Absolute 3.1 1.4 - 7.0 x10E3/uL   Lymphocytes Absolute 1.5 0.7 - 3.1 x10E3/uL   Monocytes Absolute 0.6  0.1 - 0.9 x10E3/uL   EOS (ABSOLUTE) 0.2 0.0 - 0.4 x10E3/uL   Basophils Absolute 0.0 0.0 - 0.2 x10E3/uL   Immature Granulocytes 0 Not Estab. %   Immature Grans (Abs) 0.0 0.0 - 0.1 x10E3/uL  Comprehensive metabolic panel  Result Value Ref Range   Glucose 122 (H) 70 - 99 mg/dL   BUN 23 8 - 27 mg/dL   Creatinine, Ser 1.29 (H) 0.57 - 1.00 mg/dL   eGFR 42 (L) >59 mL/min/1.73   BUN/Creatinine Ratio 18 12 - 28   Sodium 143 134 - 144 mmol/L   Potassium 4.1 3.5 - 5.2 mmol/L   Chloride 105 96 - 106 mmol/L   CO2 21 20 - 29 mmol/L   Calcium 9.8 8.7 - 10.3 mg/dL   Total Protein 6.6 6.0 - 8.5 g/dL   Albumin 4.1 3.8 - 4.8 g/dL   Globulin, Total 2.5 1.5 - 4.5 g/dL   Albumin/Globulin Ratio 1.6 1.2 - 2.2   Bilirubin Total 0.2 0.0 - 1.2 mg/dL   Alkaline Phosphatase 58 44 - 121 IU/L   AST 15 0 - 40 IU/L   ALT 8 0 - 32 IU/L  TSH  Result Value Ref Range   TSH 0.866 0.450 - 4.500  uIU/mL  Vitamin B12  Result Value Ref Range   Vitamin B-12 223 (L) 232 - 1,245 pg/mL  Magnesium  Result Value Ref Range   Magnesium 1.7 1.6 - 2.3 mg/dL  B Nat Peptide  Result Value Ref Range   BNP 46.5 0.0 - 100.0 pg/mL  Iron Binding Cap (TIBC)([facility])  Result Value Ref Range   Total Iron Binding Capacity 218 (L) 250 - 450 ug/dL   UIBC 164 118 - 369 ug/dL   Iron 54 27 - 139 ug/dL   Iron Saturation 25 15 - 55 %      Assessment & Plan:   Problem List Items Addressed This Visit       Cardiovascular and Mediastinum   Aortic atherosclerosis (HCC)    Chronic, ongoing noted on CT imaging 01/26/17. Unable to tolerate statins. Continue taking Praluent every 14 days.       Relevant Orders   Comprehensive metabolic panel   Lipid Panel w/o Chol/HDL Ratio   Hypertension associated with diabetes (HCC)    Chronic, stable. BP well below goal in office and at home. Recommend BP checks at home a few times per week at home. DASH diet at home. Continue current medication regimen and adjust as needed. Labs: CBC and  CMP. Return in 3 months.       Relevant Orders   Bayer DCA Hb A1c Waived   Comprehensive metabolic panel   LVH (left ventricular hypertrophy)    Chronic, noted on echo.  Continue collaboration with cardiology and current medication regimen.      Relevant Orders   CBC with Differential/Platelet   Comprehensive metabolic panel   Mitral valve regurgitation    Chronic, ongoing.  Continue collaboration with cardiology, recent notes reviewed.        Respiratory   OSA (obstructive sleep apnea)    Would benefit from CPAP as suspect OSA main cause of daytime fatigue, recommend a visit to [facility] for repeat study and supplies, she will look into this and let provider know.        Endocrine   CKD stage 3 due to type 2 diabetes mellitus (HCC)    Chronic, ongoing. A1c improved at 6.5% today, continue diet focus.  Continue current medication regimen and adjust as needed. Continue collaboration with nephrology locally and at [facility], recent labs reviewed.  Consider addition of Farxiga in future for kidney and heart health.  Return in 3 months.       Relevant Orders   Bayer DCA Hb A1c Waived   Comprehensive metabolic panel   Diabetic neuropathy (HCC) - Primary    Chronic, ongoing. A1c today trending down at 6.5%, praised for this. She wishes to continue diet control and weight management at this time vs restart of medication. Gabapentin nightly for neuropathy. Check blood sugar daily at home. Labs: A1c today.  Return in 3 months.  Consider SGLT2 for kidney health.      Relevant Orders   Bayer DCA Hb A1c Waived   Comprehensive metabolic panel   Hyperlipidemia associated with type 2 diabetes mellitus (HCC)    Chronic, ongoing. Unable to take statin due to rhabdo with associated AKI. Continue taking Praulent at this time.  Lipid panel today.  Return in 3 months.       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 Continue collaboration with hematology. Recent labs reviewed.       Relevant Medications   ferrous sulfate (SLOW IRON) 160 (50 Fe) MG TBCR SR tablet   Other Relevant Orders   CBC with Differential/Platelet     Other   B12 deficiency    Chronic.  Continue B12 supplement and obtain B12 level today.      Relevant Orders   Vitamin B12   Dyspnea on exertion    Ongoing, will continue collaboration with cardiology on this -- recent notes and echo reviewed.  Is having further testing.      History of rhabdomyolysis due to statin    Continue Praulent and adjust as needed. Unable to take statins, history of rhabdo and subsequent AKI.          Follow up plan: Return in about 3 months (around 09/09/2022) for ANNUAL PHYSICAL.

## 2022-06-10 NOTE — Assessment & Plan Note (Signed)
Continue Praulent and adjust as needed. Unable to take statins, history of rhabdo and subsequent AKI.

## 2022-06-10 NOTE — Assessment & Plan Note (Signed)
Chronic, ongoing.  Continue collaboration with cardiology, recent notes reviewed.

## 2022-06-10 NOTE — Assessment & Plan Note (Signed)
Chronic, noted on echo.  Continue collaboration with cardiology and current medication regimen.

## 2022-06-10 NOTE — Assessment & Plan Note (Signed)
Chronic, ongoing noted on CT imaging 01/26/17. Unable to tolerate statins. Continue taking Praluent every 14 days.

## 2022-06-10 NOTE — Assessment & Plan Note (Signed)
Chronic, ongoing. A1c today trending down at 6.5%, praised for this. She wishes to continue diet control and weight management at this time vs restart of medication. Gabapentin nightly for neuropathy. Check blood sugar daily at home. Labs: A1c today.  Return in 3 months.  Consider SGLT2 for kidney health.

## 2022-06-10 NOTE — Assessment & Plan Note (Addendum)
Chronic, ongoing. A1c improved at 6.5% today, continue diet focus.  Continue current medication regimen and adjust as needed. Continue collaboration with nephrology locally and at [facility], recent labs reviewed.  Consider addition of Farxiga in future for kidney and heart health.  Return in 3 months.

## 2022-06-10 NOTE — Assessment & Plan Note (Signed)
Ongoing, will continue collaboration with cardiology on this -- recent notes and echo reviewed.  Is having further testing.

## 2022-06-10 NOTE — Assessment & Plan Note (Signed)
Chronic, stable. Continue collaboration with hematology. Recent labs reviewed.

## 2022-06-10 NOTE — Assessment & Plan Note (Addendum)
Chronic.  Continue B12 supplement and obtain B12 level today.

## 2022-06-10 NOTE — Assessment & Plan Note (Signed)
Would benefit from CPAP as suspect OSA main cause of daytime fatigue, recommend a visit to [facility] for repeat study and supplies, she will look into this and let provider know.

## 2022-06-10 NOTE — Assessment & Plan Note (Signed)
Chronic, stable. BP well below goal in office and at home. Recommend BP checks at home a few times per week at home. DASH diet at home. Continue current medication regimen and adjust as needed. Labs: CBC and CMP. Return in 3 months.

## 2022-06-11 ENCOUNTER — Other Ambulatory Visit: Payer: Self-pay | Admitting: Nurse Practitioner

## 2022-06-11 DIAGNOSIS — R7989 Other specified abnormal findings of blood chemistry: Secondary | ICD-10-CM

## 2022-06-11 LAB — CBC WITH DIFFERENTIAL/PLATELET
Basophils Absolute: 0 10*3/uL (ref 0.0–0.2)
Basos: 0 %
EOS (ABSOLUTE): 0 10*3/uL (ref 0.0–0.4)
Eos: 0 %
Hematocrit: 34.8 % (ref 34.0–46.6)
Hemoglobin: 11 g/dL — ABNORMAL LOW (ref 11.1–15.9)
Immature Grans (Abs): 0 10*3/uL (ref 0.0–0.1)
Immature Granulocytes: 0 %
Lymphocytes Absolute: 2.2 10*3/uL (ref 0.7–3.1)
Lymphs: 20 %
MCH: 25.9 pg — ABNORMAL LOW (ref 26.6–33.0)
MCHC: 31.6 g/dL (ref 31.5–35.7)
MCV: 82 fL (ref 79–97)
Monocytes Absolute: 1 10*3/uL — ABNORMAL HIGH (ref 0.1–0.9)
Monocytes: 9 %
Neutrophils Absolute: 7.7 10*3/uL — ABNORMAL HIGH (ref 1.4–7.0)
Neutrophils: 71 %
Platelets: 259 10*3/uL (ref 150–450)
RBC: 4.24 x10E6/uL (ref 3.77–5.28)
RDW: 13.5 % (ref 11.7–15.4)
WBC: 11 10*3/uL — ABNORMAL HIGH (ref 3.4–10.8)

## 2022-06-11 LAB — LIPID PANEL W/O CHOL/HDL RATIO
Cholesterol, Total: 216 mg/dL — ABNORMAL HIGH (ref 100–199)
HDL: 63 mg/dL (ref >39)
LDL Chol Calc (NIH): 132 mg/dL — ABNORMAL HIGH (ref 0–99)
Triglycerides: 118 mg/dL (ref 0–149)
VLDL Cholesterol Cal: 21 mg/dL (ref 5–40)

## 2022-06-11 LAB — COMPREHENSIVE METABOLIC PANEL
ALT: 11 IU/L (ref 0–32)
AST: 15 IU/L (ref 0–40)
Albumin/Globulin Ratio: 2.1 (ref 1.2–2.2)
Albumin: 4.5 g/dL (ref 3.8–4.8)
Alkaline Phosphatase: 60 IU/L (ref 44–121)
BUN/Creatinine Ratio: 26 (ref 12–28)
BUN: 31 mg/dL — ABNORMAL HIGH (ref 8–27)
Bilirubin Total: 0.2 mg/dL (ref 0.0–1.2)
CO2: 22 mmol/L (ref 20–29)
Calcium: 10 mg/dL (ref 8.7–10.3)
Chloride: 104 mmol/L (ref 96–106)
Creatinine, Ser: 1.21 mg/dL — ABNORMAL HIGH (ref 0.57–1.00)
Globulin, Total: 2.1 g/dL (ref 1.5–4.5)
Glucose: 91 mg/dL (ref 70–99)
Potassium: 3.6 mmol/L (ref 3.5–5.2)
Sodium: 141 mmol/L (ref 134–144)
Total Protein: 6.6 g/dL (ref 6.0–8.5)
eGFR: 46 mL/min/{1.73_m2} — ABNORMAL LOW (ref >59)

## 2022-06-11 LAB — VITAMIN B12: Vitamin B-12: 226 pg/mL — ABNORMAL LOW (ref 232–1245)

## 2022-06-11 NOTE — Progress Notes (Signed)
Contacted via [place] -- lab only visit in 4 weeks. Good afternoon Tina Mcdonald, your labs have returned: - CBC shows mild elevation in white blood cell count and neutrophils, have you been sick recently?  This is often present with illness or infection.  Would like to do lab only visit in 4 weeks to recheck. - Kidney function, creatinine and eGFR, shows stable kidney disease stage 3a == continue to visit with kidney doctor.  Liver function, AST and ALT, normal. - Cholesterol labs remain elevated above goal -- are you taking your Praluent injection as ordered, if so I would like to send you to cholesterol doctors in [place] to get more recommendations to get you to goal.  Would you be okay with this? - B12 level is low, please take Vitamin B12 1000 MCG every other day for nervous system health and energy.  Any questions? Keep being amazing!!  Thank you for allowing me to participate in your care.  I appreciate you. Kindest regards, Akiah Bauch

## 2022-07-10 ENCOUNTER — Other Ambulatory Visit: Payer: Medicare Other

## 2022-07-10 DIAGNOSIS — R7989 Other specified abnormal findings of blood chemistry: Secondary | ICD-10-CM

## 2022-07-11 LAB — CBC WITH DIFFERENTIAL/PLATELET
Basophils Absolute: 0 10*3/uL (ref 0.0–0.2)
Basos: 0 %
EOS (ABSOLUTE): 0.3 10*3/uL (ref 0.0–0.4)
Eos: 5 %
Hematocrit: 35.1 % (ref 34.0–46.6)
Hemoglobin: 11 g/dL — ABNORMAL LOW (ref 11.1–15.9)
Immature Grans (Abs): 0 10*3/uL (ref 0.0–0.1)
Immature Granulocytes: 0 %
Lymphocytes Absolute: 1.6 10*3/uL (ref 0.7–3.1)
Lymphs: 25 %
MCH: 25.5 pg — ABNORMAL LOW (ref 26.6–33.0)
MCHC: 31.3 g/dL — ABNORMAL LOW (ref 31.5–35.7)
MCV: 81 fL (ref 79–97)
Monocytes Absolute: 0.8 10*3/uL (ref 0.1–0.9)
Monocytes: 12 %
Neutrophils Absolute: 3.8 10*3/uL (ref 1.4–7.0)
Neutrophils: 58 %
Platelets: 261 10*3/uL (ref 150–450)
RBC: 4.31 x10E6/uL (ref 3.77–5.28)
RDW: 13.3 % (ref 11.7–15.4)
WBC: 6.5 10*3/uL (ref 3.4–10.8)

## 2022-07-11 NOTE — Progress Notes (Signed)
Contacted via [place] afternoon Tina Mcdonald, your labs have returned and white blood cell back to normal.  You continue to have a little anemia, low hemoglobin - which we will continue to monitor.  Any questions? Keep being stellar!!  Thank you for allowing me to participate in your care.  I appreciate you. Kindest regards, Pressley Tadesse

## 2022-07-17 DIAGNOSIS — I129 Hypertensive chronic kidney disease with stage 1 through stage 4 chronic kidney disease, or unspecified chronic kidney disease: Secondary | ICD-10-CM | POA: Diagnosis not present

## 2022-07-17 DIAGNOSIS — I429 Cardiomyopathy, unspecified: Secondary | ICD-10-CM | POA: Diagnosis not present

## 2022-07-17 DIAGNOSIS — I517 Cardiomegaly: Secondary | ICD-10-CM | POA: Diagnosis not present

## 2022-07-22 ENCOUNTER — Ambulatory Visit: Payer: Self-pay | Admitting: *Deleted

## 2022-07-22 NOTE — Telephone Encounter (Signed)
  Chief Complaint: Cough Symptoms: Productive cough, greenish, voice hoarse, fatigued. Some wheezing with coughing only.  Frequency: cough x 3 weeks, worsening Saturday Pertinent Negatives: Patient denies SOB, fever Disposition: '[]'$ ED /'[]'$ Urgent Care (no appt availability in office) / '[]'$ Appointment(In office/virtual)/ '[]'$  [facility]/ '[]'$ Home Care/ '[]'$ Refused Recommended Disposition /'[]'$ [facility]/ '[x]'$  Follow-up with PCP Additional Notes: Pt also would like medications reviewed, states had cardiology appt last Monday, states "He took me off of medication I haven't been taking at all and changed things around, I want Jolene to review them." Called practice, [facility], for consult, possible fit in tomorrow AM. Advised pt if cannot be seen in 24 hours may be advised UC.  Care advise provided, pt verbalizes understanding. Reason for Disposition  [1] Continuous (nonstop) coughing interferes with work or school AND [2] no improvement using cough treatment per Care Advice  Answer Assessment - Initial Assessment Questions 1. ONSET: "When did the cough begin?"      3 weeks ago, gradually worsening.  2. SEVERITY: "How bad is the cough today?"     "Can't stop once I start."  3. SPUTUM: "Describe the color of your sputum" (none, dry cough; clear, white, yellow, green)     greenish 4. HEMOPTYSIS: "Are you coughing up any blood?" If so ask: "How much?" (flecks, streaks, tablespoons, etc.)     no 5. DIFFICULTY BREATHING: "Are you having difficulty breathing?" If Yes, ask: "How bad is it?" (e.g., mild, moderate, severe)    - MILD: No SOB at rest, mild SOB with walking, speaks normally in sentences, can lie down, no retractions, pulse < 100.    - MODERATE: SOB at rest, SOB with minimal exertion and prefers to sit, cannot lie down flat, speaks in phrases, mild retractions, audible wheezing, pulse 100-120.    - SEVERE: Very SOB at rest, speaks in single words, struggling to breathe, sitting hunched  forward, retractions, pulse > 120      No 6. FEVER: "Do you have a fever?" If Yes, ask: "What is your temperature, how was it measured, and when did it start?"     no 7. CARDIAC HISTORY: "Do you have any history of heart disease?" (e.g., heart attack, congestive heart failure)      *No Answer* 8. LUNG HISTORY: "Do you have any history of lung disease?"  (e.g., pulmonary embolus, asthma, emphysema)     *No Answer* 9. PE RISK FACTORS: "Do you have a history of blood clots?" (or: recent major surgery, recent prolonged travel, bedridden)     *No Answer* 10. OTHER SYMPTOMS: "Do you have any other symptoms?" (e.g., runny nose, wheezing, chest pain)       Wheezing, comes and goes when coughing. Only with coughing  Protocols used: Cough - Acute Productive-A-AH

## 2022-07-23 ENCOUNTER — Encounter: Payer: Self-pay | Admitting: Nurse Practitioner

## 2022-07-23 ENCOUNTER — Ambulatory Visit (INDEPENDENT_AMBULATORY_CARE_PROVIDER_SITE_OTHER): Payer: Medicare Other | Admitting: Nurse Practitioner

## 2022-07-23 VITALS — BP 99/65 | HR 76 | Temp 98.2°F | Ht 62.01 in | Wt 161.9 lb

## 2022-07-23 DIAGNOSIS — R052 Subacute cough: Secondary | ICD-10-CM | POA: Diagnosis not present

## 2022-07-23 MED ORDER — IPRATROPIUM-ALBUTEROL 0.5-2.5 (3) MG/3ML IN SOLN
3.0000 mL | Freq: Once | RESPIRATORY_TRACT | Status: AC
  Administered 2022-07-23: 3 mL via RESPIRATORY_TRACT

## 2022-07-23 MED ORDER — DOXYCYCLINE HYCLATE 100 MG PO TABS: 100.0000 mg | ORAL_TABLET | Freq: Two times a day (BID) | ORAL | 0 refills | Status: AC

## 2022-07-23 MED ORDER — PREDNISONE 20 MG PO TABS: 40.0000 mg | ORAL_TABLET | Freq: Every day | ORAL | 0 refills | Status: AC

## 2022-07-23 MED ORDER — BENZONATATE 100 MG PO CAPS: 100.0000 mg | ORAL_CAPSULE | Freq: Three times a day (TID) | ORAL | 0 refills | Status: DC | PRN

## 2022-07-23 NOTE — Telephone Encounter (Signed)
Pt scheduled  

## 2022-07-23 NOTE — Progress Notes (Signed)
BP 99/65   Pulse 76   Temp 98.2 F (36.8 C) (Oral)   Ht 5' 2.01" (1.575 m)   Wt 161 lb 14.4 oz (73.4 kg)   LMP  (LMP Unknown)   SpO2 95%   BMI 29.60 kg/m    Subjective:    Patient ID: Tina Mcdonald, female    DOB: 04/29/1941, 82 y.o.   MRN: [id number]  HPI: MCKELL Mcdonald is a 82 y.o. female  Chief Complaint  Patient presents with   Cough    Has had for 3 weeks, got bad on Thursday last week   COUGH Present for 3 weeks with worsening.  When started was coughing up phlegm, thought it was sinus infection and decided to let run the course.  Started taking Delsym and using inhaler + Tylenol.  Continues to have lots of congestion and coughing up phlegm.  Has been trying to eat, but can not taste food very well due to congestion.  Duration: weeks Circumstances of initial development of cough: URI Cough severity: moderate Cough description: productive Aggravating factors:   all day cough Alleviating factors: Delsym Status:  stable Treatments attempted:  Delsym and Tylenol Wheezing: yes Shortness of breath: yes Chest pain: no Chest tightness:yes Nasal congestion: yes Runny nose: no Postnasal drip: yes Frequent throat clearing or swallowing: yes Hemoptysis: no Fevers: initially x 2 days Night sweats: no Weight loss: no Heartburn: no Recent foreign travel: no Tuberculosis contacts: no   Relevant past medical, surgical, family and social history reviewed and updated as indicated. Interim medical history since our last visit reviewed. Allergies and medications reviewed and updated.  Review of Systems  Constitutional:  Positive for fatigue. Negative for activity change, appetite change, chills and fever.  HENT:  Positive for congestion. Negative for ear discharge, ear pain, facial swelling, postnasal drip, rhinorrhea, sinus pressure, sinus pain, sneezing, sore throat and voice change.   Eyes:  Negative for pain and visual disturbance.  Respiratory:  Positive for  cough, shortness of breath and wheezing. Negative for chest tightness.   Cardiovascular:  Negative for chest pain, palpitations and leg swelling.  Gastrointestinal: Negative.   Neurological:  Negative for dizziness, numbness and headaches.  Psychiatric/Behavioral: Negative.      Per HPI unless specifically indicated above     Objective:    BP 99/65   Pulse 76   Temp 98.2 F (36.8 C) (Oral)   Ht 5' 2.01" (1.575 m)   Wt 161 lb 14.4 oz (73.4 kg)   LMP  (LMP Unknown)   SpO2 95%   BMI 29.60 kg/m   Wt Readings from Last 3 Encounters:  07/23/22 161 lb 14.4 oz (73.4 kg)  06/10/22 172 lb 6.4 oz (78.2 kg)  05/09/22 171 lb 12.8 oz (77.9 kg)    Physical Exam Vitals and nursing note reviewed.  Constitutional:      General: She is awake. She is not in acute distress.    Appearance: She is well-developed and well-groomed. She is obese. She is ill-appearing. She is not toxic-appearing.  HENT:     Head: Normocephalic.     Right Ear: Hearing, ear canal and external ear normal. A middle ear effusion is present. Tympanic membrane is not injected or perforated.     Left Ear: Hearing, ear canal and external ear normal. A middle ear effusion is present. Tympanic membrane is not injected or perforated.     Nose: Rhinorrhea present. Rhinorrhea is clear.     Right Sinus: No maxillary  sinus tenderness or frontal sinus tenderness.     Left Sinus: No maxillary sinus tenderness or frontal sinus tenderness.     Mouth/Throat:     Mouth: Mucous membranes are moist.     Pharynx: Posterior oropharyngeal erythema (cobblestone appearance) present. No pharyngeal swelling or oropharyngeal exudate.  Eyes:     General: Lids are normal.        Right eye: No discharge.        Left eye: No discharge.     Conjunctiva/sclera: Conjunctivae normal.     Pupils: Pupils are equal, round, and reactive to light.  Neck:     Vascular: No carotid bruit.  Cardiovascular:     Rate and Rhythm: Normal rate and regular  rhythm.     Heart sounds: Normal heart sounds. No murmur heard.    No gallop.  Pulmonary:     Effort: Pulmonary effort is normal. No accessory muscle usage or respiratory distress.     Breath sounds: Wheezing present. No decreased breath sounds or rhonchi.     Comments: Expiratory wheezes noted throughout lung fields, no rhonchi. Abdominal:     General: Bowel sounds are normal.     Palpations: Abdomen is soft. There is no hepatomegaly or splenomegaly.  Musculoskeletal:     Cervical back: Normal range of motion and neck supple.     Right lower leg: No edema.     Left lower leg: No edema.  Lymphadenopathy:     Head:     Right side of head: No submental, submandibular, tonsillar, preauricular or posterior auricular adenopathy.     Left side of head: No submental, submandibular, tonsillar, preauricular or posterior auricular adenopathy.     Cervical: No cervical adenopathy.  Skin:    General: Skin is warm and dry.  Neurological:     Mental Status: She is alert and oriented to person, place, and time.  Psychiatric:        Attention and Perception: Attention normal.        Mood and Affect: Mood normal.        Speech: Speech normal.        Behavior: Behavior normal. Behavior is cooperative.        Thought Content: Thought content normal.     Results for orders placed or performed in visit on 07/10/22  CBC with Differential/Platelet  Result Value Ref Range   WBC 6.5 3.4 - 10.8 x10E3/uL   RBC 4.31 3.77 - 5.28 x10E6/uL   Hemoglobin 11.0 (L) 11.1 - 15.9 g/dL   Hematocrit 35.1 34.0 - 46.6 %   MCV 81 79 - 97 fL   MCH 25.5 (L) 26.6 - 33.0 pg   MCHC 31.3 (L) 31.5 - 35.7 g/dL   RDW 13.3 11.7 - 15.4 %   Platelets 261 150 - 450 x10E3/uL   Neutrophils 58 Not Estab. %   Lymphs 25 Not Estab. %   Monocytes 12 Not Estab. %   Eos 5 Not Estab. %   Basos 0 Not Estab. %   Neutrophils Absolute 3.8 1.4 - 7.0 x10E3/uL   Lymphocytes Absolute 1.6 0.7 - 3.1 x10E3/uL   Monocytes Absolute 0.8 0.1 -  0.9 x10E3/uL   EOS (ABSOLUTE) 0.3 0.0 - 0.4 x10E3/uL   Basophils Absolute 0.0 0.0 - 0.2 x10E3/uL   Immature Granulocytes 0 Not Estab. %   Immature Grans (Abs) 0.0 0.0 - 0.1 x10E3/uL      Assessment & Plan:   Problem List Items Addressed This Visit  Other   Subacute cough - Primary    Ongoing for 3 weeks, past treatment period for Covid or Flu -- will not test today.  At this time start Doxycycline 100 MG BID for 7 days, Prednisone 40 MG daily for 5 days (recent A1c stable and she will monitor BS), and Tessalon pills as needed.  Duoneb in office today.  Continue Delsym and Albuterol inhaler PRN at home.  Recommend: - Increased rest - Increasing Fluids - Acetaminophen as needed for fever/pain.  - Salt water gargling, chloraseptic spray and throat lozenges  - Mucinex.  - Humidifying the air.  Return in one week for lung check, if ongoing consider imaging.      Relevant Medications   ipratropium-albuterol (DUONEB) 0.5-2.5 (3) MG/3ML nebulizer solution 3 mL (Start on 07/23/2022  2:00 PM)     Follow up plan: Return in about 1 week (around 07/30/2022) for Cough.

## 2022-07-23 NOTE — Assessment & Plan Note (Signed)
Ongoing for 3 weeks, past treatment period for Covid or Flu -- will not test today.  At this time start Doxycycline 100 MG BID for 7 days, Prednisone 40 MG daily for 5 days (recent A1c stable and she will monitor BS), and Tessalon pills as needed.  Duoneb in office today.  Continue Delsym and Albuterol inhaler PRN at home.  Recommend: - Increased rest - Increasing Fluids - Acetaminophen as needed for fever/pain.  - Salt water gargling, chloraseptic spray and throat lozenges  - Mucinex.  - Humidifying the air.  Return in one week for lung check, if ongoing consider imaging.

## 2022-07-23 NOTE — Patient Instructions (Signed)

## 2022-07-23 NOTE — Telephone Encounter (Signed)
Called patient to offer work in for this afternoon at 3:40. Please give our office a call when patient calls back

## 2022-07-28 DIAGNOSIS — G4730 Sleep apnea, unspecified: Secondary | ICD-10-CM | POA: Diagnosis not present

## 2022-07-28 DIAGNOSIS — E119 Type 2 diabetes mellitus without complications: Secondary | ICD-10-CM | POA: Diagnosis not present

## 2022-07-28 DIAGNOSIS — I700 Atherosclerosis of aorta: Secondary | ICD-10-CM | POA: Diagnosis not present

## 2022-07-28 DIAGNOSIS — I10 Essential (primary) hypertension: Secondary | ICD-10-CM | POA: Diagnosis not present

## 2022-07-28 DIAGNOSIS — R0602 Shortness of breath: Secondary | ICD-10-CM | POA: Diagnosis not present

## 2022-07-28 DIAGNOSIS — K219 Gastro-esophageal reflux disease without esophagitis: Secondary | ICD-10-CM | POA: Diagnosis not present

## 2022-07-28 DIAGNOSIS — R053 Chronic cough: Secondary | ICD-10-CM | POA: Diagnosis not present

## 2022-08-03 NOTE — Patient Instructions (Incomplete)
Vitamin B12 1000 MCG every other day  Cough, Adult A cough helps to clear your throat and lungs. A cough may be a sign of an illness or another medical condition. An acute cough may only last 2-3 weeks, while a chronic cough may last 8 or more weeks. Many things can cause a cough. They include: Germs (viruses or bacteria) that attack the airway. Breathing in things that bother (irritate) your lungs. Allergies. Asthma. Mucus that runs down the back of your throat (postnasal drip). Smoking. Acid backing up from the stomach into the tube that moves food from the mouth to the stomach (gastroesophageal reflux). Some medicines. Lung problems. Other medical conditions, such as heart failure or a blood clot in the lung (pulmonary embolism). Follow these instructions at home: Medicines Take over-the-counter and prescription medicines only as told by your doctor. Talk with your doctor before you take medicines that stop a cough (cough suppressants). Lifestyle  Do not smoke, and try not to be around smoke. Do not use any products that contain nicotine or tobacco, such as cigarettes, e-cigarettes, and chewing tobacco. If you need help quitting, ask your doctor. Drink enough fluid to keep your pee (urine) pale yellow. Avoid caffeine. Do not drink alcohol if your doctor tells you not to drink. General instructions  Watch for any changes in your cough. Tell your doctor about them. Always cover your mouth when you cough. Stay away from things that make you cough, such as perfume, candles, campfire smoke, or cleaning products. If the air is dry, use a cool mist vaporizer or humidifier in your home. If your cough is worse at night, try using extra pillows to raise your head up higher while you sleep. Rest as needed. Keep all follow-up visits as told by your doctor. This is important. Contact a doctor if: You have new symptoms. You cough up pus. Your cough does not get better after 2-3 weeks, or  your cough gets worse. Cough medicine does not help your cough and you are not sleeping well. You have pain that gets worse or pain that is not helped with medicine. You have a fever. You are losing weight and you do not know why. You have night sweats. Get help right away if: You cough up blood. You have trouble breathing. Your heartbeat is very fast. These symptoms may be an emergency. Do not wait to see if the symptoms will go away. Get medical help right away. Call your local emergency services (911 in the [place] Do not drive yourself to the hospital. Summary A cough helps to clear your throat and lungs. Many things can cause a cough. Take over-the-counter and prescription medicines only as told by your doctor. Always cover your mouth when you cough. Contact a doctor if you have new symptoms or you have a cough that does not get better or gets worse. This information is not intended to replace advice given to you by your health care provider. Make sure you discuss any questions you have with your health care provider. Document Revised: 07/29/2019 Document Reviewed: 06/28/2018 Elsevier Patient Education  2023 [employer].

## 2022-08-05 ENCOUNTER — Ambulatory Visit (INDEPENDENT_AMBULATORY_CARE_PROVIDER_SITE_OTHER): Payer: Medicare Other | Admitting: Nurse Practitioner

## 2022-08-05 ENCOUNTER — Encounter: Payer: Self-pay | Admitting: Nurse Practitioner

## 2022-08-05 VITALS — BP 113/64 | HR 76 | Temp 98.3°F | Ht 62.01 in | Wt 166.6 lb

## 2022-08-05 DIAGNOSIS — R052 Subacute cough: Secondary | ICD-10-CM | POA: Diagnosis not present

## 2022-08-05 NOTE — Progress Notes (Signed)
BP 113/64   Pulse 76   Temp 98.3 F (36.8 C) (Oral)   Ht 5' 2.01" (1.575 m)   Wt 166 lb 9.6 oz (75.6 kg)   LMP  (LMP Unknown)   SpO2 97%   BMI 30.46 kg/m    Subjective:    Patient ID: Tina Mcdonald, female    DOB: [birth year], 82 y.o.   MRN: [id number]  HPI: Tina Mcdonald is a 82 y.o. female  Chief Complaint  Patient presents with   Cough    Here for follow up, patient states that cough is much much better   COUGH Presents today for follow-up after treatment of cough on 07/23/22.  Treated with Doxycycline 100 MG BID for 7 days and Prednisone 40 MG daily for 5 days.  Overall reports she is feeling much better.  Very rarely coughs now.  Is seeing Dr. Raul Del on Thursday. Circumstances of initial development of cough: URI Cough severity: mild Cough description: occasional productive Aggravating factors:  fluctuates - mostly in morning Alleviating factors: as above treatment Status:  better Treatments attempted: as above Wheezing: yes -- uses Albuterol Shortness of breath:  walking longer distance only Chest pain: no Chest tightness:no Nasal congestion: no Runny nose: no Postnasal drip: no Frequent throat clearing or swallowing: no Hemoptysis: no Fevers: no Night sweats: every night present for awhile  Relevant past medical, surgical, family and social history reviewed and updated as indicated. Interim medical history since our last visit reviewed. Allergies and medications reviewed and updated.  Review of Systems  Constitutional:  Negative for activity change, appetite change, chills, fatigue and fever.  HENT:  Negative for congestion, ear discharge, ear pain, facial swelling, postnasal drip, rhinorrhea, sinus pressure, sinus pain, sneezing, sore throat and voice change.   Eyes:  Negative for pain and visual disturbance.  Respiratory:  Positive for cough, shortness of breath and wheezing. Negative for chest tightness.   Cardiovascular:  Negative for chest pain,  palpitations and leg swelling.  Gastrointestinal: Negative.   Neurological:  Negative for dizziness, numbness and headaches.  Psychiatric/Behavioral: Negative.      Per HPI unless specifically indicated above     Objective:    BP 113/64   Pulse 76   Temp 98.3 F (36.8 C) (Oral)   Ht 5' 2.01" (1.575 m)   Wt 166 lb 9.6 oz (75.6 kg)   LMP  (LMP Unknown)   SpO2 97%   BMI 30.46 kg/m   Wt Readings from Last 3 Encounters:  08/05/22 166 lb 9.6 oz (75.6 kg)  07/23/22 161 lb 14.4 oz (73.4 kg)  06/10/22 172 lb 6.4 oz (78.2 kg)    Physical Exam Vitals and nursing note reviewed.  Constitutional:      General: She is awake. She is not in acute distress.    Appearance: She is well-developed and well-groomed. She is obese. She is ill-appearing. She is not toxic-appearing.  HENT:     Head: Normocephalic.     Right Ear: Hearing, tympanic membrane, ear canal and external ear normal. No middle ear effusion. Tympanic membrane is not injected or perforated.     Left Ear: Hearing, tympanic membrane, ear canal and external ear normal.  No middle ear effusion. Tympanic membrane is not injected or perforated.     Nose: No rhinorrhea.     Right Sinus: No maxillary sinus tenderness or frontal sinus tenderness.     Left Sinus: No maxillary sinus tenderness or frontal sinus tenderness.  Mouth/Throat:     Mouth: Mucous membranes are moist.     Pharynx: Posterior oropharyngeal erythema (cobblestone appearance) present. No pharyngeal swelling or oropharyngeal exudate.  Eyes:     General: Lids are normal.        Right eye: No discharge.        Left eye: No discharge.     Conjunctiva/sclera: Conjunctivae normal.     Pupils: Pupils are equal, round, and reactive to light.  Neck:     Vascular: No carotid bruit.  Cardiovascular:     Rate and Rhythm: Normal rate and regular rhythm.     Heart sounds: Normal heart sounds. No murmur heard.    No gallop.  Pulmonary:     Effort: Pulmonary effort is  normal. No accessory muscle usage or respiratory distress.     Breath sounds: No decreased breath sounds, wheezing or rhonchi.     Comments: Overall clear throughout. Abdominal:     General: Bowel sounds are normal.     Palpations: Abdomen is soft. There is no hepatomegaly or splenomegaly.  Musculoskeletal:     Cervical back: Normal range of motion and neck supple.     Right lower leg: No edema.     Left lower leg: No edema.  Lymphadenopathy:     Head:     Right side of head: No submental, submandibular, tonsillar, preauricular or posterior auricular adenopathy.     Left side of head: No submental, submandibular, tonsillar, preauricular or posterior auricular adenopathy.     Cervical: No cervical adenopathy.  Skin:    General: Skin is warm and dry.  Neurological:     Mental Status: She is alert and oriented to person, place, and time.  Psychiatric:        Attention and Perception: Attention normal.        Mood and Affect: Mood normal.        Speech: Speech normal.        Behavior: Behavior normal. Behavior is cooperative.        Thought Content: Thought content normal.     Results for orders placed or performed in visit on 07/10/22  CBC with Differential/Platelet  Result Value Ref Range   WBC 6.5 3.4 - 10.8 x10E3/uL   RBC 4.31 3.77 - 5.28 x10E6/uL   Hemoglobin 11.0 (L) 11.1 - 15.9 g/dL   Hematocrit 35.1 34.0 - 46.6 %   MCV 81 79 - 97 fL   MCH 25.5 (L) 26.6 - 33.0 pg   MCHC 31.3 (L) 31.5 - 35.7 g/dL   RDW 13.3 11.7 - 15.4 %   Platelets 261 150 - 450 x10E3/uL   Neutrophils 58 Not Estab. %   Lymphs 25 Not Estab. %   Monocytes 12 Not Estab. %   Eos 5 Not Estab. %   Basos 0 Not Estab. %   Neutrophils Absolute 3.8 1.4 - 7.0 x10E3/uL   Lymphocytes Absolute 1.6 0.7 - 3.1 x10E3/uL   Monocytes Absolute 0.8 0.1 - 0.9 x10E3/uL   EOS (ABSOLUTE) 0.3 0.0 - 0.4 x10E3/uL   Basophils Absolute 0.0 0.0 - 0.2 x10E3/uL   Immature Granulocytes 0 Not Estab. %   Immature Grans (Abs) 0.0 0.0  - 0.1 x10E3/uL      Assessment & Plan:   Problem List Items Addressed This Visit       Other   Subacute cough - Primary    Ongoing and improved.  At this time continue Albuterol and Tessalon as needed.  Scheduled  to see pulmonary.  Recommend: - Increased rest - Increasing Fluids - Acetaminophen as needed for fever/pain.  - Salt water gargling, chloraseptic spray and throat lozenges  - Mucinex.  - Humidifying the air.          Follow up plan: Return for as scheduled April 2nd.

## 2022-08-05 NOTE — Assessment & Plan Note (Signed)
Ongoing and improved.  At this time continue Albuterol and Tessalon as needed.  Scheduled to see pulmonary.  Recommend: - Increased rest - Increasing Fluids - Acetaminophen as needed for fever/pain.  - Salt water gargling, chloraseptic spray and throat lozenges  - Mucinex.  - Humidifying the air.

## 2022-08-07 DIAGNOSIS — J439 Emphysema, unspecified: Secondary | ICD-10-CM | POA: Diagnosis not present

## 2022-08-07 DIAGNOSIS — I272 Pulmonary hypertension, unspecified: Secondary | ICD-10-CM | POA: Diagnosis not present

## 2022-08-07 DIAGNOSIS — J9801 Acute bronchospasm: Secondary | ICD-10-CM | POA: Diagnosis not present

## 2022-08-07 DIAGNOSIS — R053 Chronic cough: Secondary | ICD-10-CM | POA: Diagnosis not present

## 2022-08-07 DIAGNOSIS — R0602 Shortness of breath: Secondary | ICD-10-CM | POA: Diagnosis not present

## 2022-08-29 ENCOUNTER — Ambulatory Visit (HOSPITAL_BASED_OUTPATIENT_CLINIC_OR_DEPARTMENT_OTHER): Payer: Medicare Other | Admitting: Internal Medicine

## 2022-09-04 ENCOUNTER — Ambulatory Visit: Payer: Self-pay | Admitting: *Deleted

## 2022-09-04 NOTE — Telephone Encounter (Signed)
Message from [place] sent at 09/04/2022  3:00 PM EDT  Summary: seeking clinical advice on immunizations   Pt stated the pharmacist gave her a list of immunizations that she had not received and/or that may not be updated. Pt is requesting a callback to go over her immunizations and what she may be missing.   Pt seeking clinical advice.          Call History   Type Contact Phone/Fax User  09/04/2022 03:00 PM EDT Phone (Incoming) [name] (Self) [phone] Jerilynn Mages) [place], [place]   Reason for Disposition  Health Information question, no triage required and triager able to answer question  Answer Assessment - Initial Assessment Questions 1. REASON FOR CALL or QUESTION: "What is your reason for calling today?" or "How can I best help you?" or "What question do you have that I can help answer?"     The pharmacy gave me a print out of the shots I'm overdue.  I know I've had some of these shots.  They say I need:  Pneumonia, RSV, Tdap, Prevnar.     I thought I got the Pneumonia shot at the office.  I went over her immunization record with her.   Some of the injections she got at [facility] so they are not showing up on the list the pharmacy gave her.   She has an appt for her physical in April so she's going to bring the list with her so she can go over it with Marnee Guarneri, NP.  Protocols used: Information Only Call - No Triage-A-AH

## 2022-09-04 NOTE — Telephone Encounter (Signed)
  Chief Complaint: Wanted to go over her immunizations to see if she was up to date.   The pharmacy gave a list of immunizations they were saying she was overdue to get. Symptoms: N/A Frequency: N/A Pertinent Negatives: Patient denies N/A Disposition: [] ED /[] Urgent Care (no appt availability in office) / [] Appointment(In office/virtual)/ []  [facility]/ [x] Home Care/ [] Refused Recommended Disposition /[] [facility]/ []  Follow-up with PCP Additional Notes: She has an appt. In April for her physical so she's going to go over her immunizations during her physical with Marnee Guarneri, NP.

## 2022-09-23 ENCOUNTER — Encounter: Payer: Self-pay | Admitting: Nurse Practitioner

## 2022-09-23 ENCOUNTER — Ambulatory Visit (INDEPENDENT_AMBULATORY_CARE_PROVIDER_SITE_OTHER): Payer: Medicare Other | Admitting: Nurse Practitioner

## 2022-09-23 VITALS — BP 122/65 | HR 76 | Temp 98.4°F | Ht 60.0 in | Wt 166.3 lb

## 2022-09-23 DIAGNOSIS — Z1231 Encounter for screening mammogram for malignant neoplasm of breast: Secondary | ICD-10-CM

## 2022-09-23 DIAGNOSIS — D631 Anemia in chronic kidney disease: Secondary | ICD-10-CM | POA: Diagnosis not present

## 2022-09-23 DIAGNOSIS — I517 Cardiomegaly: Secondary | ICD-10-CM | POA: Diagnosis not present

## 2022-09-23 DIAGNOSIS — E1149 Type 2 diabetes mellitus with other diabetic neurological complication: Secondary | ICD-10-CM

## 2022-09-23 DIAGNOSIS — E1122 Type 2 diabetes mellitus with diabetic chronic kidney disease: Secondary | ICD-10-CM | POA: Diagnosis not present

## 2022-09-23 DIAGNOSIS — R0609 Other forms of dyspnea: Secondary | ICD-10-CM | POA: Diagnosis not present

## 2022-09-23 DIAGNOSIS — I700 Atherosclerosis of aorta: Secondary | ICD-10-CM | POA: Diagnosis not present

## 2022-09-23 DIAGNOSIS — E785 Hyperlipidemia, unspecified: Secondary | ICD-10-CM

## 2022-09-23 DIAGNOSIS — N1831 Chronic kidney disease, stage 3a: Secondary | ICD-10-CM

## 2022-09-23 DIAGNOSIS — E1169 Type 2 diabetes mellitus with other specified complication: Secondary | ICD-10-CM

## 2022-09-23 DIAGNOSIS — Z8739 Personal history of other diseases of the musculoskeletal system and connective tissue: Secondary | ICD-10-CM

## 2022-09-23 DIAGNOSIS — E559 Vitamin D deficiency, unspecified: Secondary | ICD-10-CM | POA: Diagnosis not present

## 2022-09-23 DIAGNOSIS — G4733 Obstructive sleep apnea (adult) (pediatric): Secondary | ICD-10-CM | POA: Diagnosis not present

## 2022-09-23 DIAGNOSIS — Z0000 Encounter for general adult medical examination without abnormal findings: Secondary | ICD-10-CM | POA: Diagnosis not present

## 2022-09-23 DIAGNOSIS — D563 Thalassemia minor: Secondary | ICD-10-CM

## 2022-09-23 DIAGNOSIS — I152 Hypertension secondary to endocrine disorders: Secondary | ICD-10-CM | POA: Diagnosis not present

## 2022-09-23 DIAGNOSIS — E538 Deficiency of other specified B group vitamins: Secondary | ICD-10-CM

## 2022-09-23 DIAGNOSIS — K219 Gastro-esophageal reflux disease without esophagitis: Secondary | ICD-10-CM | POA: Diagnosis not present

## 2022-09-23 DIAGNOSIS — N183 Chronic kidney disease, stage 3 unspecified: Secondary | ICD-10-CM | POA: Diagnosis not present

## 2022-09-23 DIAGNOSIS — E1159 Type 2 diabetes mellitus with other circulatory complications: Secondary | ICD-10-CM | POA: Diagnosis not present

## 2022-09-23 DIAGNOSIS — I340 Nonrheumatic mitral (valve) insufficiency: Secondary | ICD-10-CM | POA: Diagnosis not present

## 2022-09-23 LAB — BAYER DCA HB A1C WAIVED: HB A1C (BAYER DCA - WAIVED): 6.6 % — ABNORMAL HIGH (ref 4.8–5.6)

## 2022-09-23 LAB — MICROALBUMIN, URINE WAIVED
Creatinine, Urine Waived: 100 mg/dL (ref 10–300)
Microalb, Ur Waived: 30 mg/L — ABNORMAL HIGH (ref 0–19)
Microalb/Creat Ratio: <30 mg/g (ref <30)

## 2022-09-23 MED ORDER — IRBESARTAN 300 MG PO TABS: 300.0000 mg | ORAL_TABLET | Freq: Every day | ORAL | 4 refills | Status: DC

## 2022-09-23 MED ORDER — OMEPRAZOLE 20 MG PO CPDR: 20.0000 mg | DELAYED_RELEASE_CAPSULE | Freq: Two times a day (BID) | ORAL | 4 refills | Status: DC

## 2022-09-23 NOTE — Assessment & Plan Note (Signed)
Chronic, noted on echo.  Continue collaboration with cardiology and current medication regimen. 

## 2022-09-23 NOTE — Assessment & Plan Note (Signed)
Chronic, ongoing noted on CT imaging 01/26/17. Unable to tolerate statins. Continue taking Praluent every 14 days.  

## 2022-09-23 NOTE — Assessment & Plan Note (Signed)
Scheduled to have retesting next month per pulmonary, continue this collaboration.

## 2022-09-23 NOTE — Assessment & Plan Note (Signed)
Ongoing with chronic cough, continue collaboration with pulmonary at this time.  Recent notes reviewed.

## 2022-09-23 NOTE — Assessment & Plan Note (Signed)
Continue Praulent and adjust as needed. Unable to take statins, history of rhabdo and subsequent AKI.  Has referral in place to lipid clinic.

## 2022-09-23 NOTE — Progress Notes (Signed)
BP 122/65   Pulse 76   Temp 98.4 F (36.9 C)   Ht 5' (1.524 m)   Wt 166 lb 4.8 oz (75.4 kg)   LMP  (LMP Unknown)   SpO2 96%   BMI 32.48 kg/m    Subjective:    Patient ID: Tina Mcdonald, female    DOB: December 06, 1940, 82 y.o.   MRN: [id number]  HPI: Tina Mcdonald is a 82 y.o. female presenting on 09/23/2022 for comprehensive medical examination. Current medical complaints include:none  She currently lives with: self Menopausal Symptoms: no  DIABETES A1c on last check 6.5%.  No current medications for diabetes, diet focus.  Previously took Metformin.  B12 level was on lower side again, at 226.  Takes Gabapentin 200 MG at night for radiculopathy and leg discomfort at night. Hypoglycemic episodes:no Polydipsia/polyuria: no Visual disturbance: no Chest pain: no Paresthesias: no Glucose Monitoring: yes             Accucheck frequency: not checking             Fasting glucose:              Post prandial:             Evening:             Before meals: Taking Insulin?: no             Long acting insulin:             Short acting insulin: Blood Pressure Monitoring: occasional Retinal Examination: Up To Date -- [employer] Foot Exam: Up to Date Pneumovax: Up to Date Influenza: Up to Date Aspirin: no   HYPERTENSION / HYPERLIPIDEMIA Continues on Amlodipine, Carvedilol, Chlorthalidone, Avapro.  Last visit with cardiology 07/28/22 -- they decreased Amlodipine to 5 MG and increased Coreg to 25 MG. History of aortic atherosclerosis noted on past CT 01/26/17.    Has underlying OSA but could never afford CPAP.  She returned to see Dr. Raul Del with pulmonary on 09/04/22 for her chronic cough and SOB -- they have recommended repeat sleep study in home -- scheduled for May.  Pollen is currently aggravating her underlying chronic cough, not taking any allergy medications.  Had a major statin reaction in past that led to admission to hospital and AKI. Currently taking Praluent.  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 Satisfied with current treatment? yes Duration of hypertension: chronic BP monitoring frequency: not recently BP range:  BP medication side effects: no Duration of hyperlipidemia: chronic Cholesterol medication side effects: no Cholesterol supplements: none Medication compliance: good compliance Aspirin: no Recent stressors: no Recurrent headaches: no Visual changes: no Palpitations: no Dyspnea: all the time -- baseline, no worsening with chronic cough Chest pain: no Lower extremity edema: yes Dizzy/lightheaded: no   CHRONIC KIDNEY DISEASE Followed by nephrology.  Last visit on 04/30/22, missed 08/26/22 visit -- has rescheduled. CKD status: stable Medications renally dose: yes Previous renal evaluation: yes Pneumovax:  Up to Date Influenza Vaccine:  Up to Date    Latest Ref Rng & Units 06/10/2022    9:28 AM 05/09/2022    9:37 AM 07/26/2021   10:17 AM  BMP  Glucose 70 - 99 mg/dL 91  122  107   BUN 8 - 27 mg/dL 31  23  25    Creatinine 0.57 - 1.00 mg/dL 1.21  1.29  1.03  BUN/Creat Ratio 12 - 28 26  18     Sodium 134 - 144 mmol/L 141  143  138   Potassium 3.5 - 5.2 mmol/L 3.6  4.1  4.0   Chloride 96 - 106 mmol/L 104  105  103   CO2 20 - 29 mmol/L 22  21  25    Calcium 8.7 - 10.3 mg/dL 10.0  9.8  9.6      ANEMIA Visited with hematology on 01/27/22, recommended she continue her Slow Fe every other day.  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 Anemia status: controlled Compliance with treatment: good compliance Iron supplementation side effects: no Severity of anemia: mild Fatigue: yes  Decreased exercise tolerance: no  Dyspnea on exertion: no Palpitations: no Bleeding: no Pica: no      09/23/2022    8:21 AM 07/23/2022    1:27 PM 06/10/2022    9:44 AM  Depression screen PHQ 2/9  Decreased Interest 0 0 0  Down, Depressed, Hopeless 0 0  0  PHQ - 2 Score 0 0 0  Altered sleeping 0 1 0  Tired, decreased energy 2 1 0  Change in appetite 0 0 0  Feeling bad or failure about yourself  0 0 0  Trouble concentrating 0 0 0  Moving slowly or fidgety/restless 0 0 0  Suicidal thoughts 0 0 0  PHQ-9 Score 2 2 0  Difficult doing work/chores Not difficult at all  Not difficult at all       09/23/2022    8:21 AM 07/23/2022    1:27 PM 06/10/2022    9:44 AM 05/09/2022    8:53 AM  GAD 7 : Generalized Anxiety Score  Nervous, Anxious, on Edge 0 0 0 0  Control/stop worrying 0 0 0 0  Worry too much - different things 0 0 0 0  Trouble relaxing 0 0 0 0  Restless 0 0 0 0  Easily annoyed or irritable 0 0 0 0  Afraid - awful might happen 0 0 0 0  Total GAD 7 Score 0 0 0 0  Anxiety Difficulty Not difficult at all  Not difficult at all Not difficult at all      02/05/2022    4:18 PM 03/05/2022   11:05 AM 03/31/2022   12:06 PM 06/10/2022    9:52 AM 09/23/2022    8:48 AM  Fall Risk  Falls in the past year? 0 0 0 0 0  Was there an injury with Fall? 0 0 0 0 0  Fall Risk Category Calculator 0 0 0 0 0  Fall Risk Category (Retired) Low Low Low Low   (RETIRED) Patient Fall Risk Level  Low fall risk Low fall risk    Patient at Risk for Falls Due to No Fall Risks No Fall Risks  No Fall Risks No Fall Risks  Fall risk Follow up Falls evaluation completed Falls evaluation completed Falls evaluation completed;Education provided;Falls prevention discussed Falls evaluation completed Falls prevention discussed    Functional Status Survey: Is the patient deaf or have difficulty hearing?: No Does the patient have difficulty seeing, even when wearing glasses/contacts?: No Does the patient have difficulty concentrating, remembering, or making decisions?: No Does the patient have difficulty walking or climbing stairs?: No Does the patient have difficulty dressing or bathing?: No Does the patient have difficulty doing errands alone such as visiting a doctor's  office or shopping?: No    Past Medical History:  Past Medical History:  Diagnosis Date   Anemia  Arthritis    Chronic kidney disease    Colon polyps    Diabetes mellitus without complication    Diabetic neuropathy    Dyspnea    GERD (gastroesophageal reflux disease)    Hyperlipemia    Hypertension    Osteoarthritis    Radiculopathy    weakness in right leg   Sleep apnea    does not have her cpap anymore   Stress incontinence    Urticaria    Vitamin B 12 deficiency    Wears glasses     Surgical History:  Past Surgical History:  Procedure Laterality Date   ABDOMINAL HYSTERECTOMY     APPENDECTOMY     BREAST BIOPSY Right    right-neg   BREAST BIOPSY Left    us/bx/clip-neg   CARPAL TUNNEL RELEASE     right   COLONOSCOPY     COLONOSCOPY WITH PROPOFOL N/A 12/10/2015   Procedure: COLONOSCOPY WITH PROPOFOL;  Surgeon: Lucilla Lame, MD;  Location: [place];  Service: Endoscopy;  Laterality: N/A;  Diabetic - oral meds Sleep Apnea LATEX allergy   COLONOSCOPY WITH PROPOFOL N/A 01/17/2019   Procedure: COLONOSCOPY WITH PROPOFOL;  Surgeon: Virgel Manifold, MD;  Location: [place];  Service: Endoscopy;  Laterality: N/A;   CYSTOSCOPY     DILATION AND CURETTAGE OF UTERUS     ESOPHAGOGASTRODUODENOSCOPY (EGD) WITH PROPOFOL N/A 01/17/2019   Procedure: ESOPHAGOGASTRODUODENOSCOPY (EGD) WITH PROPOFOL;  Surgeon: Virgel Manifold, MD;  Location: [place];  Service: Endoscopy;  Laterality: N/A;   EYE SURGERY Bilateral    lens implant   GIVENS CAPSULE STUDY N/A 05/04/2019   Procedure: GIVENS CAPSULE STUDY;  Surgeon: Virgel Manifold, MD;  Location: [facility];  Service: Endoscopy;  Laterality: N/A;   LUMBAR FUSION  2009   POLYPECTOMY  12/10/2015   Procedure: POLYPECTOMY;  Surgeon: Lucilla Lame, MD;  Location: [place];  Service: Endoscopy;;   POLYPECTOMY  01/17/2019   Procedure: POLYPECTOMY;  Surgeon: Virgel Manifold, MD;   Location: [place];  Service: Endoscopy;;   RESECTION DISTAL CLAVICAL Right 11/25/2013   Procedure: RIGHT SHOULDER OPEN RESECTION DISTAL CLAVICAL EXCISION SOFT TISSUE TUMOR SHOULDER DEEP SUBFASCIAL INTRAMUSCULAR/DEBRIDEMENT/ROTATOR CUFF REPAIR/BICEPS TENODESIS/MANIPULATION;  Surgeon: Renette Butters, MD;  Location: [place];  Service: Orthopedics;  Laterality: Right;   SHOULDER ARTHROSCOPY WITH ROTATOR CUFF REPAIR AND SUBACROMIAL DECOMPRESSION Right 11/25/2013   Procedure: SHOULDER ARTHROSCOPY WITH DEBRIDEMENT ROTATOR CUFF REPAIR  A;  Surgeon: Renette Butters, MD;  Location: [place];  Service: Orthopedics;  Laterality: Right;    Medications:  Current Outpatient Medications on File Prior to Visit  Medication Sig   acetaminophen (TYLENOL) 325 MG tablet Take 650 mg by mouth every 6 (six) hours as needed.   albuterol (VENTOLIN HFA) 108 (90 Base) MCG/ACT inhaler Inhale 2 puffs into the lungs every 6 (six) hours as needed for wheezing or shortness of breath.   Alirocumab (PRALUENT) 150 MG/ML SOAJ Inject 150 mg into the skin every 14 (fourteen) days.   amLODipine (NORVASC) 5 MG tablet Take 5 mg by mouth every morning.   blood glucose meter kit and supplies KIT Dispense based on patient and insurance preference. Use up to four times daily as directed. (FOR ICD-9 250.00, 250.01).   carvedilol (COREG) 25 MG tablet Take 25 mg by mouth 2 (two) times daily.   chlorthalidone (HYGROTON) 25 MG tablet Take 25 mg by mouth daily.   Cholecalciferol (VITAMIN D-1000 MAX ST) 25  MCG (1000 UT) tablet Take by mouth.   diclofenac sodium (VOLTAREN) 1 % GEL Apply 2 g topically 2 (two) times daily as needed.   ferrous sulfate (SLOW IRON) 160 (50 Fe) MG TBCR SR tablet Take by mouth daily.   fluticasone (FLONASE) 50 MCG/ACT nasal spray Place into both nostrils.   gabapentin (NEURONTIN) 100 MG capsule TAKE 2 CAPSULES BY MOUTH AT  BEDTIME   glucose blood test strip Check daily. ICD 10  E11.9   triamcinolone cream (KENALOG) 0.1 % Apply 1 Application topically 2 (two) times daily.   vitamin C (ASCORBIC ACID) 250 MG tablet Take 250 mg by mouth every other day.   No current facility-administered medications on file prior to visit.    Allergies:  Allergies  Allergen Reactions   Statins Other (See Comments)    Rhabdomyolysis   Contrast Media [Iodinated Contrast Media] Itching   Penicillins Itching    Has patient had a PCN reaction causing immediate rash, facial/tongue/throat swelling, SOB or lightheadedness with hypotension: Yes Has patient had a PCN reaction causing severe rash involving mucus membranes or skin necrosis: Unknown Has patient had a PCN reaction that required hospitalization: Yes Has patient had a PCN reaction occurring within the last 10 years: Yes If all of the above answers are "NO", then may proceed with Cephalosporin use.   Sulfa Antibiotics Itching   Aloe Itching and Rash   Latex Rash   Neosporin [Neomycin-Bacitracin Zn-Polymyx] Itching and Rash   Other Rash    Pt has reaction to high acidity foods.     Social History:  Social History   Socioeconomic History   Marital status: Single    Spouse name: Not on file   Number of children: Not on file   Years of education: Not on file   Highest education level: Some college, no degree  Occupational History   Occupation: retired  Tobacco Use   Smoking status: Former    Packs/day: 1.00    Years: 15.00    Additional pack years: 0.00    Total pack years: 15.00    Types: Cigarettes    Quit date: 11/22/1997    Years since quitting: 24.8   Smokeless tobacco: Never  Vaping Use   Vaping Use: Never used  Substance and Sexual Activity   Alcohol use: No   Drug use: No   Sexual activity: Not Currently  Other Topics Concern   Not on file  Social History Narrative   Not on file   Social Determinants of Health   Financial Resource Strain: Low Risk  (03/31/2022)   Overall Financial Resource Strain  (CARDIA)    Difficulty of Paying Living Expenses: Not very hard  Food Insecurity: No Food Insecurity (03/31/2022)   Hunger Vital Sign    Worried About Running Out of Food in the Last Year: Never true    [place] in the Last Year: Never true  Transportation Needs: No Transportation Needs (03/31/2022)   PRAPARE - Hydrologist (Medical): No    Lack of Transportation (Non-Medical): No  Physical Activity: Inactive (03/31/2022)   Exercise Vital Sign    Days of Exercise per Week: 0 days    Minutes of Exercise per Session: 0 min  Stress: No Stress Concern Present (03/31/2022)   [place]    Feeling of Stress : Not at all  Social Connections: Moderately Integrated (09/23/2022)   Social Connection and Isolation Panel [NHANES]  Frequency of Communication with Friends and Family: More than three times a week    Frequency of Social Gatherings with Friends and Family: More than three times a week    Attends Religious Services: More than 4 times per year    Active Member of [employer] or Organizations: Yes    Attends Archivist Meetings: Never    Marital Status: Never married  Intimate Partner Violence: Not At Risk (03/31/2022)   Humiliation, Afraid, Rape, and Kick questionnaire    Fear of Current or Ex-Partner: No    Emotionally Abused: No    Physically Abused: No    Sexually Abused: No   Social History   Tobacco Use  Smoking Status Former   Packs/day: 1.00   Years: 15.00   Additional pack years: 0.00   Total pack years: 15.00   Types: Cigarettes   Quit date: 11/22/1997   Years since quitting: 24.8  Smokeless Tobacco Never   Social History   Substance and Sexual Activity  Alcohol Use No    Family History:  Family History  Problem Relation Age of Onset   Heart disease Mother    Diabetes Mother    Hypertension Mother    Heart disease Father    Hypertension Father    Diabetes  Sister    Hypertension Sister    Heart disease Sister    Kidney disease Sister    Diabetes Sister    Hypertension Sister    Kidney disease Sister    Non-Hodgkin's lymphoma Maternal Uncle    Breast cancer Cousin 23    Past medical history, surgical history, medications, allergies, family history and social history reviewed with patient today and changes made to appropriate areas of the chart.   ROS All other ROS negative except what is listed above and in the HPI.      Objective:    BP 122/65   Pulse 76   Temp 98.4 F (36.9 C)   Ht 5' (1.524 m)   Wt 166 lb 4.8 oz (75.4 kg)   LMP  (LMP Unknown)   SpO2 96%   BMI 32.48 kg/m   Wt Readings from Last 3 Encounters:  09/23/22 166 lb 4.8 oz (75.4 kg)  08/05/22 166 lb 9.6 oz (75.6 kg)  07/23/22 161 lb 14.4 oz (73.4 kg)    Physical Exam Vitals and nursing note reviewed. Exam conducted with a chaperone present.  Constitutional:      General: She is awake. She is not in acute distress.    Appearance: She is well-developed and well-groomed. She is obese. She is not ill-appearing or toxic-appearing.  HENT:     Head: Normocephalic and atraumatic.     Right Ear: Hearing, tympanic membrane, ear canal and external ear normal. No drainage.     Left Ear: Hearing, tympanic membrane, ear canal and external ear normal. No drainage.     Nose: Nose normal.     Right Sinus: No maxillary sinus tenderness or frontal sinus tenderness.     Left Sinus: No maxillary sinus tenderness or frontal sinus tenderness.     Mouth/Throat:     Mouth: Mucous membranes are moist.     Pharynx: Oropharynx is clear. Uvula midline. No pharyngeal swelling, oropharyngeal exudate or posterior oropharyngeal erythema.  Eyes:     General: Lids are normal.        Right eye: No discharge.        Left eye: No discharge.     Extraocular Movements: Extraocular movements  intact.     Conjunctiva/sclera: Conjunctivae normal.     Pupils: Pupils are equal, round, and reactive  to light.     Visual Fields: Right eye visual fields normal and left eye visual fields normal.  Neck:     Thyroid: No thyromegaly.     Vascular: No carotid bruit.     Trachea: Trachea normal.  Cardiovascular:     Rate and Rhythm: Normal rate and regular rhythm.     Heart sounds: Normal heart sounds. No murmur heard.    No gallop.  Pulmonary:     Effort: Pulmonary effort is normal. No accessory muscle usage or respiratory distress.     Breath sounds: Normal breath sounds.  Chest:  Breasts:    Right: Normal.     Left: Normal.  Abdominal:     General: Bowel sounds are normal.     Palpations: Abdomen is soft. There is no hepatomegaly or splenomegaly.     Tenderness: There is no abdominal tenderness.  Musculoskeletal:        General: Normal range of motion.     Cervical back: Normal range of motion and neck supple.     Right lower leg: No edema.     Left lower leg: No edema.  Lymphadenopathy:     Head:     Right side of head: No submental, submandibular, tonsillar, preauricular or posterior auricular adenopathy.     Left side of head: No submental, submandibular, tonsillar, preauricular or posterior auricular adenopathy.     Cervical: No cervical adenopathy.     Upper Body:     Right upper body: No supraclavicular, axillary or pectoral adenopathy.     Left upper body: No supraclavicular, axillary or pectoral adenopathy.  Skin:    General: Skin is warm and dry.     Capillary Refill: Capillary refill takes less than 2 seconds.     Findings: No rash.  Neurological:     Mental Status: She is alert and oriented to person, place, and time.     Gait: Gait is intact.     Deep Tendon Reflexes: Reflexes are normal and symmetric.     Reflex Scores:      Brachioradialis reflexes are 2+ on the right side and 2+ on the left side.      Patellar reflexes are 2+ on the right side and 2+ on the left side. Psychiatric:        Attention and Perception: Attention normal.        Mood and Affect:  Mood normal.        Speech: Speech normal.        Behavior: Behavior normal. Behavior is cooperative.        Thought Content: Thought content normal.        Judgment: Judgment normal.    Results for orders placed or performed in visit on 07/10/22  CBC with Differential/Platelet  Result Value Ref Range   WBC 6.5 3.4 - 10.8 x10E3/uL   RBC 4.31 3.77 - 5.28 x10E6/uL   Hemoglobin 11.0 (L) 11.1 - 15.9 g/dL   Hematocrit 35.1 34.0 - 46.6 %   MCV 81 79 - 97 fL   MCH 25.5 (L) 26.6 - 33.0 pg   MCHC 31.3 (L) 31.5 - 35.7 g/dL   RDW 13.3 11.7 - 15.4 %   Platelets 261 150 - 450 x10E3/uL   Neutrophils 58 Not Estab. %   Lymphs 25 Not Estab. %   Monocytes 12 Not Estab. %  Eos 5 Not Estab. %   Basos 0 Not Estab. %   Neutrophils Absolute 3.8 1.4 - 7.0 x10E3/uL   Lymphocytes Absolute 1.6 0.7 - 3.1 x10E3/uL   Monocytes Absolute 0.8 0.1 - 0.9 x10E3/uL   EOS (ABSOLUTE) 0.3 0.0 - 0.4 x10E3/uL   Basophils Absolute 0.0 0.0 - 0.2 x10E3/uL   Immature Granulocytes 0 Not Estab. %   Immature Grans (Abs) 0.0 0.0 - 0.1 x10E3/uL      Assessment & Plan:   Problem List Items Addressed This Visit       Cardiovascular and Mediastinum   Aortic atherosclerosis    Chronic, ongoing noted on CT imaging 01/26/17. Unable to tolerate statins. Continue taking Praluent every 14 days.       Relevant Medications   irbesartan (AVAPRO) 300 MG tablet   Other Relevant Orders   Comprehensive metabolic panel   Lipid Panel w/o Chol/HDL Ratio   Hypertension associated with diabetes    Chronic, stable. BP well below goal in office. Recommend BP checks at home a few times per week at home. DASH diet at home. Continue current medication regimen and adjust as needed. Labs: CBC, CMP, TSH, urine ALB.  Urine ALB 21 October 2022, continue Irbesartan. Return in 3 months.       Relevant Medications   irbesartan (AVAPRO) 300 MG tablet   Other Relevant Orders   Bayer DCA Hb A1c Waived   Comprehensive metabolic panel   TSH   LVH  (left ventricular hypertrophy)    Chronic, noted on echo.  Continue collaboration with cardiology and current medication regimen.      Relevant Medications   irbesartan (AVAPRO) 300 MG tablet   Mitral valve regurgitation    Chronic, ongoing.  Continue collaboration with cardiology, recent notes reviewed.      Relevant Medications   irbesartan (AVAPRO) 300 MG tablet   Other Relevant Orders   Comprehensive metabolic panel   Lipid Panel w/o Chol/HDL Ratio     Respiratory   OSA (obstructive sleep apnea)    Scheduled to have retesting next month per pulmonary, continue this collaboration.        Digestive   GERD (gastroesophageal reflux disease)    Chronic, ongoing with 2 cm hiatal hernia and current increased symptoms after eating fried food.  Continue Omeprazole 20 MG BID at this time and reduce back down once symptoms improved.  Recommend Gas Ex as needed for belching.  Monitor foods and reduce fried food intake.  Check Mag level annually.      Relevant Medications   omeprazole (PRILOSEC) 20 MG capsule   Other Relevant Orders   Magnesium     Endocrine   CKD stage 3 due to type 2 diabetes mellitus - Primary    Chronic, ongoing. A1c stable at 6.6% today, continue diet focus.  Continue current medication regimen and adjust as needed. Continue collaboration with nephrology locally and at [facility], recent labs reviewed.  Urine ALB 21 October 2022, continue ARB.  Consider addition of Farxiga in future for kidney and heart health.  Return in 3 months.       Relevant Medications   irbesartan (AVAPRO) 300 MG tablet   Other Relevant Orders   Bayer DCA Hb A1c Waived   Microalbumin, Urine Waived   CBC with Differential/Platelet   Comprehensive metabolic panel   Diabetic neuropathy    Chronic, ongoing. A1c stable at 6.6%, praised for this. She wishes to continue diet control and weight management at this time vs restart  of medication. Gabapentin nightly for neuropathy. Check blood sugar daily  at home. Labs: A1c today.  Return in 3 months.  Consider SGLT2 for kidney health.      Relevant Medications   irbesartan (AVAPRO) 300 MG tablet   Other Relevant Orders   Bayer DCA Hb A1c Waived   Hyperlipidemia associated with type 2 diabetes mellitus    Chronic, ongoing. Unable to take statin due to rhabdo with associated AKI. Continue taking Praulent at this time.  Lipid panel today.  Return in 3 months.       Relevant Medications   irbesartan (AVAPRO) 300 MG tablet   Other Relevant Orders   Bayer DCA Hb A1c Waived   Lipid Panel w/o Chol/HDL Ratio     Genitourinary   Anemia due to stage 3 chronic kidney disease    Chronic, stable. Continue collaboration with hematology as needed. Check labs today.       Relevant Orders   CBC with Differential/Platelet     Other   B12 deficiency    Chronic.  Continue B12 supplement and obtain B12 level today.      Relevant Orders   Vitamin B12   Dyspnea on exertion    Ongoing with chronic cough, continue collaboration with pulmonary at this time.  Recent notes reviewed.      History of rhabdomyolysis due to statin    Continue Praulent and adjust as needed. Unable to take statins, history of rhabdo and subsequent AKI.  Has referral in place to lipid clinic.      Thalassemia alpha carrier    Continue collaboration with hematology as needed.      Vitamin D deficiency    Chronic, ongoing.  Noted on [facility] labs 04/02/21 at 12.1, continue supplement and recheck labs today.      Relevant Orders   VITAMIN D 25 Hydroxy (Vit-D Deficiency, Fractures)   Other Visit Diagnoses     Encounter for screening mammogram for malignant neoplasm of breast       Mammogram ordered today and discussed with patient.   Relevant Orders   MM 3D SCREENING MAMMOGRAM BILATERAL BREAST   Encounter for annual physical exam       Annual physical today with labs and health maintenance reviewed, discussed with patient.        Follow up plan: Return in about 3  months (around 12/23/2022) for T2DM, HTN/HLD, CHRONIC COUGH, CKD.   LABORATORY TESTING:  - Pap smear: not applicable  IMMUNIZATIONS:   - Tdap: Tetanus vaccination status reviewed: last tetanus booster within 10 years. - Influenza: Up to date - Pneumovax: Up to date - Prevnar: Up to date - COVID: Up to date - HPV: Not applicable - Shingrix vaccine: Up to date  SCREENING: -Mammogram: Ordered today  - Colonoscopy: Up to date  - Bone Density: Up to date  -Hearing Test: Not applicable  -Spirometry: Not applicable   PATIENT COUNSELING:   Advised to take 1 mg of folate supplement per day if capable of pregnancy.   Sexuality: Discussed sexually transmitted diseases, partner selection, use of condoms, avoidance of unintended pregnancy  and contraceptive alternatives.   Advised to avoid cigarette smoking.  I discussed with the patient that most people either abstain from alcohol or drink within safe limits (<=14/week and <=4 drinks/occasion for males, <=7/weeks and <= 3 drinks/occasion for females) and that the risk for alcohol disorders and other health effects rises proportionally with the number of drinks per week and how often a drinker  exceeds daily limits.  Discussed cessation/primary prevention of drug use and availability of treatment for abuse.   Diet: Encouraged to adjust caloric intake to maintain  or achieve ideal body weight, to reduce intake of dietary saturated fat and total fat, to limit sodium intake by avoiding high sodium foods and not adding table salt, and to maintain adequate dietary potassium and calcium preferably from fresh fruits, vegetables, and low-fat dairy products.    Stressed the importance of regular exercise  Injury prevention: Discussed safety belts, safety helmets, smoke detector, smoking near bedding or upholstery.   Dental health: Discussed importance of regular tooth brushing, flossing, and dental visits.    NEXT PREVENTATIVE PHYSICAL DUE IN 1  YEAR. Return in about 3 months (around 12/23/2022) for T2DM, HTN/HLD, CHRONIC COUGH, CKD.

## 2022-09-23 NOTE — Assessment & Plan Note (Addendum)
Chronic, ongoing. A1c stable at 6.6% today, continue diet focus.  Continue current medication regimen and adjust as needed. Continue collaboration with nephrology locally and at [facility], recent labs reviewed.  Urine ALB 21 October 2022, continue ARB.  Consider addition of Farxiga in future for kidney and heart health.  Return in 3 months.

## 2022-09-23 NOTE — Assessment & Plan Note (Signed)
Chronic.  Continue B12 supplement and obtain B12 level today. 

## 2022-09-23 NOTE — Assessment & Plan Note (Signed)
Chronic, ongoing.  Noted on [facility] labs 04/02/21 at 12.1, continue supplement and recheck labs today.

## 2022-09-23 NOTE — Patient Instructions (Addendum)
Start Claritin 10 MG daily.  Please call to schedule your mammogram and/or bone density: [facility] at [place]: [address], [place], [place] [address] Phone: [phone]  [place] [facility] [address] [place],    [address] Phone: [phone]    Vitamin B12 Deficiency Vitamin B12 deficiency occurs when the body does not have enough of this important vitamin. The body needs this vitamin: To make red blood cells. To make DNA. This is the genetic material inside cells. To help the nerves work properly so they can carry messages from the brain to the body. Vitamin B12 deficiency can cause health problems, such as not having enough red blood cells in the blood (anemia). This can lead to nerve damage if untreated. What are the causes? This condition may be caused by: Not eating enough foods that contain vitamin B12. Not having enough stomach acid and digestive fluids to properly absorb vitamin B12 from the food that you eat. Having certain diseases that make it hard to absorb vitamin B12. These diseases include Crohn's disease, chronic pancreatitis, and cystic fibrosis. An autoimmune disorder in which the body does not make enough of a protein (intrinsic factor) within the stomach, resulting in not enough absorption of vitamin B12. Having a surgery in which part of the stomach or small intestine is removed. Taking certain medicines that make it hard for the body to absorb vitamin B12. These include: Heartburn medicines, such as antacids and proton pump inhibitors. Some medicines that are used to treat diabetes. What increases the risk? The following factors may make you more likely to develop a vitamin B12 deficiency: Being an older adult. Eating a vegetarian or vegan diet that does not include any foods that come from animals. Eating a poor diet while you are pregnant. Taking certain medicines. Having  alcoholism. What are the signs or symptoms? In some cases, there are no symptoms of this condition. If the condition leads to anemia or nerve damage, various symptoms may occur, such as: Weakness. Tiredness (fatigue). Loss of appetite. Numbness or tingling in your hands and feet. Redness and burning of the tongue. Depression, confusion, or memory problems. Trouble walking. If anemia is severe, symptoms can include: Shortness of breath. Dizziness. Rapid heart rate. How is this diagnosed? This condition may be diagnosed with a blood test to measure the level of vitamin B12 in your blood. You may also have other tests, including: A group of tests that measure certain characteristics of blood cells (complete blood count, CBC). A blood test to measure intrinsic factor. A procedure where a thin tube with a camera on the end is used to look into your stomach or intestines (endoscopy). Other tests may be needed to discover the cause of the deficiency. How is this treated? Treatment for this condition depends on the cause. This condition may be treated by: Changing your eating and drinking habits, such as: Eating more foods that contain vitamin B12. Drinking less alcohol or no alcohol. Getting vitamin B12 injections. Taking vitamin B12 supplements by mouth (orally). Your health care provider will tell you which dose is best for you. Follow these instructions at home: Eating and drinking  Include foods in your diet that come from animals and contain a lot of vitamin B12. These include: Meats and poultry. This includes beef, pork, chicken, [place], and organ meats, such as liver. Seafood. This includes clams, rainbow trout, salmon, tuna, and haddock. Eggs. Dairy foods such  as milk, yogurt, and cheese. Eat foods that have vitamin B12 added to them (are fortified), such as ready-to-eat breakfast cereals. Check the label on the package to see if a food is fortified. The items listed above may  not be a complete list of foods and beverages you can eat and drink. Contact a dietitian for more information. Alcohol use Do not drink alcohol if: Your health care provider tells you not to drink. You are pregnant, may be pregnant, or are planning to become pregnant. If you drink alcohol: Limit how much you have to: 0-1 drink a day for women. 0-2 drinks a day for men. Know how much alcohol is in your drink. In the [place] one drink equals one 12 oz bottle of beer (355 mL), one 5 oz glass of wine (148 mL), or one 1 oz glass of hard liquor (44 mL). General instructions Get vitamin B12 injections if told to by your health care provider. Take supplements only as told by your health care provider. Follow the directions carefully. Keep all follow-up visits. This is important. Contact a health care provider if: Your symptoms come back. Your symptoms get worse or do not improve with treatment. Get help right away: You develop shortness of breath. You have a rapid heart rate. You have chest pain. You become dizzy or you faint. These symptoms may be an emergency. Get help right away. Call 911. Do not wait to see if the symptoms will go away. Do not drive yourself to the hospital. Summary Vitamin B12 deficiency occurs when the body does not have enough of this important vitamin. Common causes include not eating enough foods that contain vitamin B12, not being able to absorb vitamin B12 from the food that you eat, having a surgery in which part of the stomach or small intestine is removed, or taking certain medicines. Eat foods that have vitamin B12 in them. Treatment may include making a change in the way you eat and drink, getting vitamin B12 injections, or taking vitamin B12 supplements. This information is not intended to replace advice given to you by your health care provider. Make sure you discuss any questions you have with your health care provider. Document Revised: 02/01/2021 Document  Reviewed: 02/01/2021 Elsevier Patient Education  [place].

## 2022-09-23 NOTE — Assessment & Plan Note (Addendum)
Chronic, stable. Continue collaboration with hematology as needed. Check labs today.

## 2022-09-23 NOTE — Assessment & Plan Note (Signed)
Continue collaboration with hematology as needed.

## 2022-09-23 NOTE — Assessment & Plan Note (Signed)
Chronic, ongoing.  Continue collaboration with cardiology, recent notes reviewed. 

## 2022-09-23 NOTE — Assessment & Plan Note (Signed)
Chronic, ongoing with 2 cm hiatal hernia and current increased symptoms after eating fried food.  Continue Omeprazole 20 MG BID at this time and reduce back down once symptoms improved.  Recommend Gas Ex as needed for belching.  Monitor foods and reduce fried food intake.  Check Mag level annually.

## 2022-09-23 NOTE — Assessment & Plan Note (Signed)
Chronic, ongoing. A1c stable at 6.6%, praised for this. She wishes to continue diet control and weight management at this time vs restart of medication. Gabapentin nightly for neuropathy. Check blood sugar daily at home. Labs: A1c today.  Return in 3 months.  Consider SGLT2 for kidney health.

## 2022-09-23 NOTE — Assessment & Plan Note (Signed)
Chronic, stable. BP well below goal in office. Recommend BP checks at home a few times per week at home. DASH diet at home. Continue current medication regimen and adjust as needed. Labs: CBC, CMP, TSH, urine ALB.  Urine ALB 21 October 2022, continue Irbesartan. Return in 3 months.

## 2022-09-23 NOTE — Assessment & Plan Note (Signed)
Chronic, ongoing. Unable to take statin due to rhabdo with associated AKI. Continue taking Praulent at this time.  Lipid panel today.  Return in 3 months.  

## 2022-09-24 ENCOUNTER — Other Ambulatory Visit: Payer: Self-pay | Admitting: Nurse Practitioner

## 2022-09-24 DIAGNOSIS — D631 Anemia in chronic kidney disease: Secondary | ICD-10-CM

## 2022-09-24 LAB — COMPREHENSIVE METABOLIC PANEL
ALT: 12 IU/L (ref 0–32)
AST: 14 IU/L (ref 0–40)
Albumin/Globulin Ratio: 1.6 (ref 1.2–2.2)
Albumin: 3.9 g/dL (ref 3.8–4.8)
Alkaline Phosphatase: 66 IU/L (ref 44–121)
BUN/Creatinine Ratio: 18 (ref 12–28)
BUN: 21 mg/dL (ref 8–27)
Bilirubin Total: <0.2 mg/dL (ref 0.0–1.2)
CO2: 24 mmol/L (ref 20–29)
Calcium: 9.4 mg/dL (ref 8.7–10.3)
Chloride: 104 mmol/L (ref 96–106)
Creatinine, Ser: 1.18 mg/dL — ABNORMAL HIGH (ref 0.57–1.00)
Globulin, Total: 2.4 g/dL (ref 1.5–4.5)
Glucose: 113 mg/dL — ABNORMAL HIGH (ref 70–99)
Potassium: 4 mmol/L (ref 3.5–5.2)
Sodium: 143 mmol/L (ref 134–144)
Total Protein: 6.3 g/dL (ref 6.0–8.5)
eGFR: 47 mL/min/{1.73_m2} — ABNORMAL LOW (ref >59)

## 2022-09-24 LAB — CBC WITH DIFFERENTIAL/PLATELET
Basophils Absolute: 0 10*3/uL (ref 0.0–0.2)
Basos: 0 %
EOS (ABSOLUTE): 0.2 10*3/uL (ref 0.0–0.4)
Eos: 4 %
Hematocrit: 30.6 % — ABNORMAL LOW (ref 34.0–46.6)
Hemoglobin: 9.5 g/dL — ABNORMAL LOW (ref 11.1–15.9)
Immature Grans (Abs): 0 10*3/uL (ref 0.0–0.1)
Immature Granulocytes: 1 %
Lymphocytes Absolute: 1.6 10*3/uL (ref 0.7–3.1)
Lymphs: 35 %
MCH: 25.6 pg — ABNORMAL LOW (ref 26.6–33.0)
MCHC: 31 g/dL — ABNORMAL LOW (ref 31.5–35.7)
MCV: 83 fL (ref 79–97)
Monocytes Absolute: 0.4 10*3/uL (ref 0.1–0.9)
Monocytes: 10 %
Neutrophils Absolute: 2.3 10*3/uL (ref 1.4–7.0)
Neutrophils: 50 %
Platelets: 248 10*3/uL (ref 150–450)
RBC: 3.71 x10E6/uL — ABNORMAL LOW (ref 3.77–5.28)
RDW: 13.7 % (ref 11.7–15.4)
WBC: 4.6 10*3/uL (ref 3.4–10.8)

## 2022-09-24 LAB — LIPID PANEL W/O CHOL/HDL RATIO
Cholesterol, Total: 162 mg/dL (ref 100–199)
HDL: 53 mg/dL (ref >39)
LDL Chol Calc (NIH): 85 mg/dL (ref 0–99)
Triglycerides: 135 mg/dL (ref 0–149)
VLDL Cholesterol Cal: 24 mg/dL (ref 5–40)

## 2022-09-24 LAB — MAGNESIUM: Magnesium: 1.6 mg/dL (ref 1.6–2.3)

## 2022-09-24 LAB — VITAMIN D 25 HYDROXY (VIT D DEFICIENCY, FRACTURES): Vit D, 25-Hydroxy: 37.6 ng/mL (ref 30.0–100.0)

## 2022-09-24 LAB — VITAMIN B12: Vitamin B-12: 540 pg/mL (ref 232–1245)

## 2022-09-24 LAB — TSH: TSH: 1.17 u[IU]/mL (ref 0.450–4.500)

## 2022-09-24 NOTE — Progress Notes (Signed)
Contacted via [facility] -- need lab only visit in 2 weeks please   Good day Tina Mcdonald, your labs have returned: - CBC is showing some trend down in hemoglobin and hematocrit from previous check in January. It looks like you do not see hematology until August.  I would like you to return in 2 weeks to recheck these levels outpatient so we can see if we need to get you into hematology sooner. - Kidney function, creatinine and eGFR, continue to show stage CKD 3a with no worsening.  Continue visits with kidney doctor. - Thyroid, TSH, is normal.  Magnesium level + Vitamin D and B12 all stable levels. - Cholesterol labs improved this check, continue Praluent which is offering benefit.  Any questions? Keep being amazing!!  Thank you for allowing me to participate in your care.  I appreciate you. Kindest regards, Laira Penninger

## 2022-09-25 NOTE — Progress Notes (Signed)
Scheduled patient lab only visit for 2 weeks on 10/08/2022 @ 10:00 am.

## 2022-10-08 ENCOUNTER — Other Ambulatory Visit: Payer: Medicare Other

## 2022-10-08 DIAGNOSIS — D631 Anemia in chronic kidney disease: Secondary | ICD-10-CM

## 2022-10-08 DIAGNOSIS — N1831 Chronic kidney disease, stage 3a: Secondary | ICD-10-CM | POA: Diagnosis not present

## 2022-10-09 ENCOUNTER — Encounter: Payer: Self-pay | Admitting: Nurse Practitioner

## 2022-10-09 LAB — CBC WITH DIFFERENTIAL/PLATELET
Basophils Absolute: 0 10*3/uL (ref 0.0–0.2)
Basos: 1 %
EOS (ABSOLUTE): 0.2 10*3/uL (ref 0.0–0.4)
Eos: 4 %
Hematocrit: 33.2 % — ABNORMAL LOW (ref 34.0–46.6)
Hemoglobin: 10 g/dL — ABNORMAL LOW (ref 11.1–15.9)
Immature Grans (Abs): 0 10*3/uL (ref 0.0–0.1)
Immature Granulocytes: 0 %
Lymphocytes Absolute: 1.7 10*3/uL (ref 0.7–3.1)
Lymphs: 41 %
MCH: 25.3 pg — ABNORMAL LOW (ref 26.6–33.0)
MCHC: 30.1 g/dL — ABNORMAL LOW (ref 31.5–35.7)
MCV: 84 fL (ref 79–97)
Monocytes Absolute: 0.4 10*3/uL (ref 0.1–0.9)
Monocytes: 11 %
Neutrophils Absolute: 1.8 10*3/uL (ref 1.4–7.0)
Neutrophils: 43 %
Platelets: 230 10*3/uL (ref 150–450)
RBC: 3.95 x10E6/uL (ref 3.77–5.28)
RDW: 13.8 % (ref 11.7–15.4)
WBC: 4.1 10*3/uL (ref 3.4–10.8)

## 2022-10-09 LAB — IRON AND TIBC
Iron Saturation: 24 % (ref 15–55)
Iron: 48 ug/dL (ref 27–139)
Total Iron Binding Capacity: 200 ug/dL — ABNORMAL LOW (ref 250–450)
UIBC: 152 ug/dL (ref 118–369)

## 2022-10-09 LAB — FERRITIN: Ferritin: 1004 ng/mL — ABNORMAL HIGH (ref 15–150)

## 2022-10-09 LAB — FOLATE: Folate: 9 ng/mL (ref >3.0)

## 2022-10-09 NOTE — Progress Notes (Signed)
Contacted via MyChart   Good morning Markisha, your labs have returned.  Ferritin level has trended up, but iron level remains stable.  Are you taking iron supplement?  Stop this for now.  You are still showing lower hemoglobin and hematocrit, but improved from last check.  Please let me know about supplements.  I am pondering whether we need to get you back into hematology, but want to know about supplements first.  Thank you:) Keep being amazing!!  Thank you for allowing me to participate in your care.  I appreciate you. Kindest regards, Dominico Rod

## 2022-10-13 DIAGNOSIS — H04122 Dry eye syndrome of left lacrimal gland: Secondary | ICD-10-CM | POA: Diagnosis not present

## 2022-10-13 DIAGNOSIS — E119 Type 2 diabetes mellitus without complications: Secondary | ICD-10-CM | POA: Diagnosis not present

## 2022-10-13 DIAGNOSIS — Z961 Presence of intraocular lens: Secondary | ICD-10-CM | POA: Diagnosis not present

## 2022-10-13 LAB — HM DIABETES EYE EXAM

## 2022-10-14 ENCOUNTER — Encounter: Payer: Self-pay | Admitting: Nurse Practitioner

## 2022-10-16 DIAGNOSIS — I517 Cardiomegaly: Secondary | ICD-10-CM | POA: Diagnosis not present

## 2022-10-22 DIAGNOSIS — M1711 Unilateral primary osteoarthritis, right knee: Secondary | ICD-10-CM | POA: Diagnosis not present

## 2022-10-22 DIAGNOSIS — M25561 Pain in right knee: Secondary | ICD-10-CM | POA: Diagnosis not present

## 2022-10-24 DIAGNOSIS — M1711 Unilateral primary osteoarthritis, right knee: Secondary | ICD-10-CM | POA: Diagnosis not present

## 2022-10-29 ENCOUNTER — Other Ambulatory Visit: Payer: Self-pay | Admitting: Nurse Practitioner

## 2022-10-29 NOTE — Telephone Encounter (Signed)
Requested medication (s) are due for refill today: yes  Requested medication (s) are on the active medication list: yes  Last refill:  12/19/21  Future visit scheduled: yes  Notes to clinic:  Unable to refill per protocol, cannot delegate.      Requested Prescriptions  Pending Prescriptions Disp Refills   triamcinolone cream (KENALOG) 0.1 % [Pharmacy Med Name: Triamcinolone Acetonide 0.1 % External Cream] 30 g 0    Sig: APPLY  CREAM EXTERNALLY TWICE DAILY     Not Delegated - Dermatology:  Corticosteroids Failed - 10/29/2022 11:28 AM      Failed - This refill cannot be delegated      Passed - Valid encounter within last 12 months    Recent Outpatient Visits           1 month ago CKD stage 3 due to type 2 diabetes mellitus (HCC)   [place] [facility] [place], Corrie Dandy T, NP   2 months ago Subacute cough   [place] [facility] [place], [place] T, NP   3 months ago Subacute cough   [place] [facility] [place], [place] T, NP   4 months ago Other diabetic neurological complication associated with type 2 diabetes mellitus (HCC)   [place] [facility] [place], Corrie Dandy T, NP   5 months ago Dyspnea on exertion   [place] [facility] [place], Dorie Rank, NP       Future Appointments             In 1 month [name], Dorie Rank, NP [place] [facility]

## 2022-11-06 ENCOUNTER — Ambulatory Visit
Admission: RE | Admit: 2022-11-06 | Discharge: 2022-11-06 | Disposition: A | Discharge status: Home / Self Care | Payer: Medicare Other | Source: Ambulatory Visit | Attending: Nurse Practitioner | Admitting: Nurse Practitioner

## 2022-11-06 DIAGNOSIS — Z1231 Encounter for screening mammogram for malignant neoplasm of breast: Secondary | ICD-10-CM | POA: Diagnosis not present

## 2022-11-10 ENCOUNTER — Encounter (INDEPENDENT_AMBULATORY_CARE_PROVIDER_SITE_OTHER): Payer: Medicare Other | Admitting: Ophthalmology

## 2022-11-10 DIAGNOSIS — Z7984 Long term (current) use of oral hypoglycemic drugs: Secondary | ICD-10-CM | POA: Diagnosis not present

## 2022-11-10 DIAGNOSIS — I10 Essential (primary) hypertension: Secondary | ICD-10-CM | POA: Diagnosis not present

## 2022-11-10 DIAGNOSIS — H43813 Vitreous degeneration, bilateral: Secondary | ICD-10-CM

## 2022-11-10 DIAGNOSIS — E113293 Type 2 diabetes mellitus with mild nonproliferative diabetic retinopathy without macular edema, bilateral: Secondary | ICD-10-CM | POA: Diagnosis not present

## 2022-11-10 DIAGNOSIS — H43823 Vitreomacular adhesion, bilateral: Secondary | ICD-10-CM

## 2022-11-10 DIAGNOSIS — H35033 Hypertensive retinopathy, bilateral: Secondary | ICD-10-CM

## 2022-11-10 NOTE — Progress Notes (Signed)
Contacted via MyChart   Normal mammogram, may repeat in one year:)

## 2022-11-24 DIAGNOSIS — M1711 Unilateral primary osteoarthritis, right knee: Secondary | ICD-10-CM | POA: Diagnosis not present

## 2022-11-24 DIAGNOSIS — Z01812 Encounter for preprocedural laboratory examination: Secondary | ICD-10-CM | POA: Diagnosis not present

## 2022-11-26 ENCOUNTER — Other Ambulatory Visit: Payer: Self-pay | Admitting: Nurse Practitioner

## 2022-11-26 DIAGNOSIS — E119 Type 2 diabetes mellitus without complications: Secondary | ICD-10-CM | POA: Diagnosis not present

## 2022-11-26 DIAGNOSIS — J449 Chronic obstructive pulmonary disease, unspecified: Secondary | ICD-10-CM | POA: Diagnosis not present

## 2022-11-26 DIAGNOSIS — I10 Essential (primary) hypertension: Secondary | ICD-10-CM | POA: Diagnosis not present

## 2022-11-27 DIAGNOSIS — K219 Gastro-esophageal reflux disease without esophagitis: Secondary | ICD-10-CM | POA: Diagnosis not present

## 2022-11-27 DIAGNOSIS — I270 Primary pulmonary hypertension: Secondary | ICD-10-CM | POA: Diagnosis not present

## 2022-11-27 DIAGNOSIS — Z91040 Latex allergy status: Secondary | ICD-10-CM | POA: Diagnosis not present

## 2022-11-27 DIAGNOSIS — G8918 Other acute postprocedural pain: Secondary | ICD-10-CM | POA: Diagnosis not present

## 2022-11-27 DIAGNOSIS — Z8261 Family history of arthritis: Secondary | ICD-10-CM | POA: Diagnosis not present

## 2022-11-27 DIAGNOSIS — I422 Other hypertrophic cardiomyopathy: Secondary | ICD-10-CM | POA: Diagnosis not present

## 2022-11-27 DIAGNOSIS — G4733 Obstructive sleep apnea (adult) (pediatric): Secondary | ICD-10-CM | POA: Diagnosis not present

## 2022-11-27 DIAGNOSIS — J449 Chronic obstructive pulmonary disease, unspecified: Secondary | ICD-10-CM | POA: Diagnosis not present

## 2022-11-27 DIAGNOSIS — I10 Essential (primary) hypertension: Secondary | ICD-10-CM | POA: Diagnosis not present

## 2022-11-27 DIAGNOSIS — Z79899 Other long term (current) drug therapy: Secondary | ICD-10-CM | POA: Diagnosis not present

## 2022-11-27 DIAGNOSIS — E119 Type 2 diabetes mellitus without complications: Secondary | ICD-10-CM | POA: Diagnosis not present

## 2022-11-27 DIAGNOSIS — N183 Chronic kidney disease, stage 3 unspecified: Secondary | ICD-10-CM | POA: Diagnosis not present

## 2022-11-27 DIAGNOSIS — Z882 Allergy status to sulfonamides status: Secondary | ICD-10-CM | POA: Diagnosis not present

## 2022-11-27 DIAGNOSIS — Z7984 Long term (current) use of oral hypoglycemic drugs: Secondary | ICD-10-CM | POA: Diagnosis not present

## 2022-11-27 DIAGNOSIS — I2510 Atherosclerotic heart disease of native coronary artery without angina pectoris: Secondary | ICD-10-CM | POA: Diagnosis not present

## 2022-11-27 DIAGNOSIS — Z8249 Family history of ischemic heart disease and other diseases of the circulatory system: Secondary | ICD-10-CM | POA: Diagnosis not present

## 2022-11-27 DIAGNOSIS — Z880 Allergy status to penicillin: Secondary | ICD-10-CM | POA: Diagnosis not present

## 2022-11-27 DIAGNOSIS — I129 Hypertensive chronic kidney disease with stage 1 through stage 4 chronic kidney disease, or unspecified chronic kidney disease: Secondary | ICD-10-CM | POA: Diagnosis not present

## 2022-11-27 DIAGNOSIS — M1711 Unilateral primary osteoarthritis, right knee: Secondary | ICD-10-CM | POA: Diagnosis not present

## 2022-11-27 NOTE — Telephone Encounter (Signed)
Pt. Has not received medication from [facility]. Requested Prescriptions  Pending Prescriptions Disp Refills   irbesartan (AVAPRO) 300 MG tablet [Pharmacy Med Name: Irbesartan 300 MG Oral Tablet] 90 tablet 0    Sig: TAKE 1 TABLET BY MOUTH ONCE DAILY NEW  BLOOD  PRESSURE  MEDICINE     Cardiovascular:  Angiotensin Receptor Blockers Failed - 11/26/2022 11:56 PM      Failed - Cr in normal range and within 180 days    Creatinine, Ser  Date Value Ref Range Status  09/23/2022 1.18 (H) 0.57 - 1.00 mg/dL Final   Creatinine, Urine  Date Value Ref Range Status  10/24/2020 28 mg/dL Final         Passed - K in normal range and within 180 days    Potassium  Date Value Ref Range Status  09/23/2022 4.0 3.5 - 5.2 mmol/L Final         Passed - Patient is not pregnant      Passed - Last BP in normal range    BP Readings from Last 1 Encounters:  09/23/22 122/65         Passed - Valid encounter within last 6 months    Recent Outpatient Visits           2 months ago CKD stage 3 due to type 2 diabetes mellitus (HCC)   [place] [facility] [place], Corrie Dandy T, NP   3 months ago Subacute cough   [place] [facility] [place], [place] T, NP   4 months ago Subacute cough   [place] [facility] [place], [place] T, NP   5 months ago Other diabetic neurological complication associated with type 2 diabetes mellitus (HCC)   [place] [facility] [place], Corrie Dandy T, NP   6 months ago Dyspnea on exertion   [place] [facility] [place], Dorie Rank, NP       Future Appointments             In 3 weeks [name], Dorie Rank, NP  [facility], [facility]

## 2022-11-28 DIAGNOSIS — Z882 Allergy status to sulfonamides status: Secondary | ICD-10-CM | POA: Diagnosis not present

## 2022-11-28 DIAGNOSIS — I422 Other hypertrophic cardiomyopathy: Secondary | ICD-10-CM | POA: Diagnosis not present

## 2022-11-28 DIAGNOSIS — Z8249 Family history of ischemic heart disease and other diseases of the circulatory system: Secondary | ICD-10-CM | POA: Diagnosis not present

## 2022-11-28 DIAGNOSIS — N183 Chronic kidney disease, stage 3 unspecified: Secondary | ICD-10-CM | POA: Diagnosis not present

## 2022-11-28 DIAGNOSIS — J449 Chronic obstructive pulmonary disease, unspecified: Secondary | ICD-10-CM | POA: Diagnosis not present

## 2022-11-28 DIAGNOSIS — Z91040 Latex allergy status: Secondary | ICD-10-CM | POA: Diagnosis not present

## 2022-11-28 DIAGNOSIS — I2510 Atherosclerotic heart disease of native coronary artery without angina pectoris: Secondary | ICD-10-CM | POA: Diagnosis not present

## 2022-11-28 DIAGNOSIS — Z7984 Long term (current) use of oral hypoglycemic drugs: Secondary | ICD-10-CM | POA: Diagnosis not present

## 2022-11-28 DIAGNOSIS — I129 Hypertensive chronic kidney disease with stage 1 through stage 4 chronic kidney disease, or unspecified chronic kidney disease: Secondary | ICD-10-CM | POA: Diagnosis not present

## 2022-11-28 DIAGNOSIS — Z79899 Other long term (current) drug therapy: Secondary | ICD-10-CM | POA: Diagnosis not present

## 2022-11-28 DIAGNOSIS — Z8261 Family history of arthritis: Secondary | ICD-10-CM | POA: Diagnosis not present

## 2022-11-28 DIAGNOSIS — Z880 Allergy status to penicillin: Secondary | ICD-10-CM | POA: Diagnosis not present

## 2022-11-28 DIAGNOSIS — M1711 Unilateral primary osteoarthritis, right knee: Secondary | ICD-10-CM | POA: Diagnosis not present

## 2022-11-28 DIAGNOSIS — K219 Gastro-esophageal reflux disease without esophagitis: Secondary | ICD-10-CM | POA: Diagnosis not present

## 2022-11-28 DIAGNOSIS — E119 Type 2 diabetes mellitus without complications: Secondary | ICD-10-CM | POA: Diagnosis not present

## 2022-11-28 DIAGNOSIS — G4733 Obstructive sleep apnea (adult) (pediatric): Secondary | ICD-10-CM | POA: Diagnosis not present

## 2022-12-02 DIAGNOSIS — R600 Localized edema: Secondary | ICD-10-CM | POA: Diagnosis not present

## 2022-12-02 DIAGNOSIS — M25561 Pain in right knee: Secondary | ICD-10-CM | POA: Diagnosis not present

## 2022-12-04 DIAGNOSIS — M25561 Pain in right knee: Secondary | ICD-10-CM | POA: Diagnosis not present

## 2022-12-04 DIAGNOSIS — R600 Localized edema: Secondary | ICD-10-CM | POA: Diagnosis not present

## 2022-12-08 DIAGNOSIS — R600 Localized edema: Secondary | ICD-10-CM | POA: Diagnosis not present

## 2022-12-08 DIAGNOSIS — M25561 Pain in right knee: Secondary | ICD-10-CM | POA: Diagnosis not present

## 2022-12-09 DIAGNOSIS — M1711 Unilateral primary osteoarthritis, right knee: Secondary | ICD-10-CM | POA: Diagnosis not present

## 2022-12-09 DIAGNOSIS — Z96651 Presence of right artificial knee joint: Secondary | ICD-10-CM | POA: Diagnosis not present

## 2022-12-11 DIAGNOSIS — R600 Localized edema: Secondary | ICD-10-CM | POA: Diagnosis not present

## 2022-12-11 DIAGNOSIS — M25561 Pain in right knee: Secondary | ICD-10-CM | POA: Diagnosis not present

## 2022-12-17 DIAGNOSIS — M25561 Pain in right knee: Secondary | ICD-10-CM | POA: Diagnosis not present

## 2022-12-17 DIAGNOSIS — R600 Localized edema: Secondary | ICD-10-CM | POA: Diagnosis not present

## 2022-12-19 DIAGNOSIS — M25561 Pain in right knee: Secondary | ICD-10-CM | POA: Diagnosis not present

## 2022-12-19 DIAGNOSIS — R600 Localized edema: Secondary | ICD-10-CM | POA: Diagnosis not present

## 2022-12-20 NOTE — Patient Instructions (Signed)
Be Involved in Your Health Care:  Taking Medications When medications are taken as directed, they can greatly improve your health. But if they are not taken as instructed, they may not work. In some cases, not taking them correctly can be harmful. To help ensure your treatment remains effective and safe, understand your medications and how to take them.  Your lab results, notes and after visit summary will be available on My Chart. We strongly encourage you to use this feature. If lab results are abnormal the clinic will contact you with the appropriate steps. If the clinic does not contact you assume the results are satisfactory. You can always see your results on My Chart. If you have questions regarding your condition, please contact the clinic during office hours. You can also ask questions on My Chart.  We at [facility] are grateful that you chose us to provide care. We strive to provide excellent and compassionate care and are always looking for feedback. If you get a survey from the clinic please complete this.   Diabetes Mellitus Basics  Diabetes mellitus, or diabetes, is a long-term (chronic) disease. It occurs when the body does not properly use sugar (glucose) that is released from food after you eat. Diabetes mellitus may be caused by one or both of these problems: Your pancreas does not make enough of a hormone called insulin. Your body does not react in a normal way to the insulin that it makes. Insulin lets glucose enter cells in your body. This gives you energy. If you have diabetes, glucose cannot get into cells. This causes high blood glucose (hyperglycemia). How to treat and manage diabetes You may need to take insulin or other diabetes medicines daily to keep your glucose in balance. If you are prescribed insulin, you will learn how to give yourself insulin by injection. You may need to adjust the amount of insulin you take based on the foods that you eat. You will  need to check your blood glucose levels using a glucose monitor as told by your health care provider. The readings can help determine if you have low or high blood glucose. Generally, you should have these blood glucose levels: Before meals (preprandial): 80-130 mg/dL (4.4-7.2 mmol/L). After meals (postprandial): below 180 mg/dL (10 mmol/L). Hemoglobin A1c (HbA1c) level: less than 7%. Your health care provider will set treatment goals for you. Keep all follow-up visits. This is important. Follow these instructions at home: Diabetes medicines Take your diabetes medicines every day as told by your health care provider. List your diabetes medicines here: Name of medicine: ______________________________ Amount (dose): _______________ Time (a.m./p.m.): _______________ Notes: ___________________________________ Name of medicine: ______________________________ Amount (dose): _______________ Time (a.m./p.m.): _______________ Notes: ___________________________________ Name of medicine: ______________________________ Amount (dose): _______________ Time (a.m./p.m.): _______________ Notes: ___________________________________ Insulin If you use insulin, list the types of insulin you use here: Insulin type: ______________________________ Amount (dose): _______________ Time (a.m./p.m.): _______________Notes: ___________________________________ Insulin type: ______________________________ Amount (dose): _______________ Time (a.m./p.m.): _______________ Notes: ___________________________________ Insulin type: ______________________________ Amount (dose): _______________ Time (a.m./p.m.): _______________ Notes: ___________________________________ Insulin type: ______________________________ Amount (dose): _______________ Time (a.m./p.m.): _______________ Notes: ___________________________________ Insulin type: ______________________________ Amount (dose): _______________ Time (a.m./p.m.): _______________  Notes: ___________________________________ Managing blood glucose  Check your blood glucose levels using a glucose monitor as told by your health care provider. Write down the times that you check your glucose levels here: Time: _______________ Notes: ___________________________________ Time: _______________ Notes: ___________________________________ Time: _______________ Notes: ___________________________________ Time: _______________ Notes: ___________________________________ Time: _______________ Notes: ___________________________________ Time: _______________ Notes: ___________________________________    Low blood glucose Low blood glucose (hypoglycemia) is when glucose is at or below 70 mg/dL (3.9 mmol/L). Symptoms may include: Feeling: Hungry. Sweaty and clammy. Irritable or easily upset. Dizzy. Sleepy. Having: A fast heartbeat. A headache. A change in your vision. Numbness around the mouth, lips, or tongue. Having trouble with: Moving (coordination). Sleeping. Treating low blood glucose To treat low blood glucose, eat or drink something containing sugar right away. If you can think clearly and swallow safely, follow the 15:15 rule: Take 15 grams of a fast-acting carb (carbohydrate), as told by your health care provider. Some fast-acting carbs are: Glucose tablets: take 3-4 tablets. Hard candy: eat 3-5 pieces. Fruit juice: drink 4 oz (120 mL). Regular (not diet) soda: drink 4-6 oz (120-180 mL). Honey or sugar: eat 1 Tbsp (15 mL). Check your blood glucose levels 15 minutes after you take the carb. If your glucose is still at or below 70 mg/dL (3.9 mmol/L), take 15 grams of a carb again. If your glucose does not go above 70 mg/dL (3.9 mmol/L) after 3 tries, get help right away. After your glucose goes back to normal, eat a meal or a snack within 1 hour. Treating very low blood glucose If your glucose is at or below 54 mg/dL (3 mmol/L), you have very low blood glucose  (severe hypoglycemia). This is an emergency. Do not wait to see if the symptoms will go away. Get medical help right away. Call your local emergency services (911 in the [place] Do not drive yourself to the hospital. Questions to ask your health care provider Should I talk with a diabetes educator? What equipment will I need to care for myself at home? What diabetes medicines do I need? When should I take them? How often do I need to check my blood glucose levels? What number can I call if I have questions? When is my follow-up visit? Where can I find a support group for people with diabetes? Where to find more information American Diabetes Association: www.diabetes.org Association of Diabetes Care and Education Specialists: www.diabeteseducator.org Contact a health care provider if: Your blood glucose is at or above 240 mg/dL (13.3 mmol/L) for 2 days in a row. You have been sick or have had a fever for 2 days or more, and you are not getting better. You have any of these problems for more than 6 hours: You cannot eat or drink. You feel nauseous. You vomit. You have diarrhea. Get help right away if: Your blood glucose is lower than 54 mg/dL (3 mmol/L). You get confused. You have trouble thinking clearly. You have trouble breathing. These symptoms may represent a serious problem that is an emergency. Do not wait to see if the symptoms will go away. Get medical help right away. Call your local emergency services (911 in the [place] Do not drive yourself to the hospital. Summary Diabetes mellitus is a chronic disease that occurs when the body does not properly use sugar (glucose) that is released from food after you eat. Take insulin and diabetes medicines as told. Check your blood glucose every day, as often as told. Keep all follow-up visits. This is important. This information is not intended to replace advice given to you by your health care provider. Make sure you discuss any  questions you have with your health care provider. Document Revised: 10/11/2019 Document Reviewed: 10/11/2019 Elsevier Patient Education  2024 [employer]  

## 2022-12-22 DIAGNOSIS — R600 Localized edema: Secondary | ICD-10-CM | POA: Diagnosis not present

## 2022-12-22 DIAGNOSIS — M25561 Pain in right knee: Secondary | ICD-10-CM | POA: Diagnosis not present

## 2022-12-23 ENCOUNTER — Ambulatory Visit (INDEPENDENT_AMBULATORY_CARE_PROVIDER_SITE_OTHER): Payer: Medicare Other | Admitting: Nurse Practitioner

## 2022-12-23 VITALS — BP 106/65 | HR 73 | Temp 97.9°F | Ht 60.0 in | Wt 157.6 lb

## 2022-12-23 DIAGNOSIS — Z96651 Presence of right artificial knee joint: Secondary | ICD-10-CM | POA: Diagnosis not present

## 2022-12-23 DIAGNOSIS — E1122 Type 2 diabetes mellitus with diabetic chronic kidney disease: Secondary | ICD-10-CM

## 2022-12-23 DIAGNOSIS — I152 Hypertension secondary to endocrine disorders: Secondary | ICD-10-CM

## 2022-12-23 DIAGNOSIS — E785 Hyperlipidemia, unspecified: Secondary | ICD-10-CM | POA: Diagnosis not present

## 2022-12-23 DIAGNOSIS — N1831 Chronic kidney disease, stage 3a: Secondary | ICD-10-CM

## 2022-12-23 DIAGNOSIS — G4733 Obstructive sleep apnea (adult) (pediatric): Secondary | ICD-10-CM | POA: Diagnosis not present

## 2022-12-23 DIAGNOSIS — I700 Atherosclerosis of aorta: Secondary | ICD-10-CM

## 2022-12-23 DIAGNOSIS — E1169 Type 2 diabetes mellitus with other specified complication: Secondary | ICD-10-CM | POA: Diagnosis not present

## 2022-12-23 DIAGNOSIS — N183 Chronic kidney disease, stage 3 unspecified: Secondary | ICD-10-CM | POA: Diagnosis not present

## 2022-12-23 DIAGNOSIS — I340 Nonrheumatic mitral (valve) insufficiency: Secondary | ICD-10-CM

## 2022-12-23 DIAGNOSIS — E1149 Type 2 diabetes mellitus with other diabetic neurological complication: Secondary | ICD-10-CM | POA: Diagnosis not present

## 2022-12-23 DIAGNOSIS — E1159 Type 2 diabetes mellitus with other circulatory complications: Secondary | ICD-10-CM | POA: Diagnosis not present

## 2022-12-23 LAB — BAYER DCA HB A1C WAIVED: HB A1C (BAYER DCA - WAIVED): 6.3 % — ABNORMAL HIGH (ref 4.8–5.6)

## 2022-12-23 NOTE — Assessment & Plan Note (Signed)
Chronic, ongoing.  Continue collaboration with cardiology, recent notes reviewed. 

## 2022-12-23 NOTE — Progress Notes (Signed)
BP 106/65   Pulse 73   Temp 97.9 F (36.6 C) (Oral)   Ht 5' (1.524 m)   Wt 157 lb 9.6 oz (71.5 kg)   LMP  (LMP Unknown)   SpO2 98%   BMI 30.78 kg/m    Subjective:    Patient ID: Tina Mcdonald, female    DOB: 03/11/1941, 82 y.o.   MRN: [id number]  HPI: Tina Mcdonald is a 82 y.o. female  Chief Complaint  Patient presents with   Diabetes   Hypertension   Hyperlipidemia   Cough   She had recent right knee replacement on 11/27/22 with Dr. Odis Luster.  Having some swelling to area still, but overall healing well.  Has had lack of appetite since recent knee surgery, right total knee replacement. Has lost 9 pounds.  DIABETES A1c in April 6.6%.  No current medications for diabetes, diet focus.  Previously took Metformin.  B12 level was 540 on recheck. Taking Gabapentin 200 MG at night for radiculopathy and leg discomfort at night.   Hypoglycemic episodes:no Polydipsia/polyuria: no Visual disturbance: no Chest pain: no Paresthesias: no Glucose Monitoring: yes             Accucheck frequency: not checking             Fasting glucose:              Post prandial:             Evening:             Before meals: Taking Insulin?: no             Long acting insulin:             Short acting insulin: Blood Pressure Monitoring: occasional Retinal Examination: Up To Date -- [employer] Foot Exam: Up to Date Pneumovax: Up to Date Influenza: Up to Date Aspirin: no   HYPERTENSION / HYPERLIPIDEMIA Continues on Amlodipine, Carvedilol, Chlorthalidone, Avapro.  Last visit with cardiology 07/28/22 when they reduced her Amlodipine and increased her Coreg to 25 MG BID. History of aortic atherosclerosis noted on past CT 01/26/17.  Has underlying OSA but could never afford CPAP, does not use.   Had a major statin reaction in past that led to admission to hospital and AKI. Currently taking Praluent for HLD every 14 days.  Last echo was 05/08/22 last with mild LVH, EF >70%, mild mitral valve  regurgitation, mild pulmonary hypertension + idiopathic hypotrophic subaortic stenosis. Satisfied with current treatment? yes Duration of hypertension: chronic BP monitoring frequency: not recently BP range:  BP medication side effects: no Duration of hyperlipidemia: chronic Cholesterol medication side effects: no Cholesterol supplements: none Medication compliance: good compliance Aspirin: no Recent stressors: no Recurrent headaches: no Visual changes: no Palpitations: no Dyspnea: no Chest pain: no Lower extremity edema: yes to right knee Dizzy/lightheaded: no   CHRONIC KIDNEY DISEASE Followed by nephrology.  Had visit on 04/30/22, missed 08/26/22 visit -- has rescheduled to October.  Follows with hematology, Dr. Cathie Hoops -- missed recent visit due to surgery, will reschedule. CKD status: stable Medications renally dose: yes Previous renal evaluation: yes Pneumovax:  Up to Date Influenza Vaccine:  Up to Date    Latest Ref Rng & Units 09/23/2022    8:31 AM 06/10/2022    9:28 AM 05/09/2022    9:37 AM  BMP  Glucose 70 - 99 mg/dL 409  91  811   BUN 8 - 27 mg/dL 21  31  23  Creatinine 0.57 - 1.00 mg/dL 1.61  0.96  0.45   BUN/Creat Ratio 12 - 28 18  26  18    Sodium 134 - 144 mmol/L 143  141  143   Potassium 3.5 - 5.2 mmol/L 4.0  3.6  4.1   Chloride 96 - 106 mmol/L 104  104  105   CO2 20 - 29 mmol/L 24  22  21    Calcium 8.7 - 10.3 mg/dL 9.4  40.9  9.8     COUGH She returned to see Dr. Meredeth Ide with pulmonary on 09/04/22 for her chronic cough and SOB -- they have recommended repeat sleep study in home -- scheduled for May, but had to cancel and needs to reschedule.  Cough is improved at this time, was worse after surgery. Status:  stable Treatments attempted: antihistamine Wheezing: no Shortness of breath: no Chest pain: no Chest tightness:no Nasal congestion: no Runny nose: no Postnasal drip: no Frequent throat clearing or swallowing: yes Hemoptysis: no Fevers: no Night sweats:  no Weight loss: yes post surgery Heartburn: no Recent foreign travel: no Tuberculosis contacts: no   Relevant past medical, surgical, family and social history reviewed and updated as indicated. Interim medical history since our last visit reviewed. Allergies and medications reviewed and updated.  Review of Systems  Constitutional:  Negative for activity change, appetite change, diaphoresis, fatigue and fever.  Respiratory:  Negative for cough, chest tightness, shortness of breath and wheezing.   Cardiovascular:  Negative for chest pain, palpitations and leg swelling.  Gastrointestinal: Negative.   Endocrine: Negative for cold intolerance, heat intolerance, polydipsia, polyphagia and polyuria.  Neurological: Negative.   Psychiatric/Behavioral: Negative.      Per HPI unless specifically indicated above     Objective:    BP 106/65   Pulse 73   Temp 97.9 F (36.6 C) (Oral)   Ht 5' (1.524 m)   Wt 157 lb 9.6 oz (71.5 kg)   LMP  (LMP Unknown)   SpO2 98%   BMI 30.78 kg/m   Wt Readings from Last 3 Encounters:  12/23/22 157 lb 9.6 oz (71.5 kg)  09/23/22 166 lb 4.8 oz (75.4 kg)  08/05/22 166 lb 9.6 oz (75.6 kg)    Physical Exam Vitals and nursing note reviewed.  Constitutional:      General: She is awake. She is not in acute distress.    Appearance: She is well-developed and well-groomed. She is obese. She is not ill-appearing or toxic-appearing.  HENT:     Head: Normocephalic.     Right Ear: Hearing normal.     Left Ear: Hearing normal.  Eyes:     General: Lids are normal.        Right eye: No discharge.        Left eye: No discharge.     Conjunctiva/sclera: Conjunctivae normal.     Pupils: Pupils are equal, round, and reactive to light.  Neck:     Thyroid: No thyromegaly.     Vascular: No carotid bruit.  Cardiovascular:     Rate and Rhythm: Normal rate and regular rhythm.     Heart sounds: Murmur heard.     Systolic murmur is present with a grade of 2/6.     No  gallop.  Pulmonary:     Effort: Pulmonary effort is normal. No accessory muscle usage or respiratory distress.     Breath sounds: Normal breath sounds.  Abdominal:     General: Bowel sounds are normal.  Palpations: Abdomen is soft. There is no hepatomegaly or splenomegaly.  Musculoskeletal:     Cervical back: Normal range of motion and neck supple.     Right lower leg: No edema.     Left lower leg: No edema.  Lymphadenopathy:     Cervical: No cervical adenopathy.  Skin:    General: Skin is warm and dry.  Neurological:     Mental Status: She is alert and oriented to person, place, and time.  Psychiatric:        Attention and Perception: Attention normal.        Mood and Affect: Mood normal.        Speech: Speech normal.        Behavior: Behavior normal. Behavior is cooperative.        Thought Content: Thought content normal.    Results for orders placed or performed in visit on 10/14/22  HM DIABETES EYE EXAM  Result Value Ref Range   HM Diabetic Eye Exam No Retinopathy No Retinopathy      Assessment & Plan:   Problem List Items Addressed This Visit       Cardiovascular and Mediastinum   Aortic atherosclerosis (HCC)    Chronic, ongoing noted on CT imaging 01/26/17. Unable to tolerate statins. Continue taking Praluent every 14 days.       Hypertension associated with diabetes (HCC)    Chronic, stable. BP well below goal in office, may need to adjust medications. Recommend BP checks at home a few times per week at home. DASH diet at home. Continue current medication regimen and adjust as needed. Labs: BMP.  Urine ALB 21 October 2022, continue Irbesartan. Return in 4 weeks for BP check, may stop Amlodipine.      Relevant Orders   Bayer DCA Hb A1c Waived   Basic metabolic panel   Mitral valve regurgitation    Chronic, ongoing.  Continue collaboration with cardiology, recent notes reviewed.        Respiratory   OSA (obstructive sleep apnea)    Recommend she schedule  sleep study, she had to cancel recently due to surgery.        Endocrine   CKD stage 3 due to type 2 diabetes mellitus (HCC) - Primary    Chronic, ongoing. A1c stable at 6.3% today, continue diet focus.  Continue current medication regimen and adjust as needed. Continue collaboration with nephrology locally and at [facility], recent labs reviewed.  Urine ALB 21 October 2022, continue ARB.  Consider addition of Farxiga in future for kidney and heart health.  Return in 3 months.  - Up to date on eye and foot exam - Vaccinations up to date - ARB on board and Praluent -- can not take statin      Relevant Orders   Bayer DCA Hb A1c Waived   Diabetic neuropathy (HCC)    Chronic, ongoing. A1c stable at 6.3%, praised for this. She wishes to continue diet control and weight management at this time vs restart of medication. Gabapentin nightly for neuropathy. Check blood sugar daily at home. Labs: A1c today.  Return in 3 months.  Consider SGLT2 for kidney health.      Relevant Orders   Bayer DCA Hb A1c Waived   Hyperlipidemia associated with type 2 diabetes mellitus (HCC)    Chronic, ongoing. Unable to take statin due to rhabdo with associated AKI. Continue taking Praulent at this time.  Lipid panel up to date.      Relevant Orders  Bayer DCA Hb A1c Waived     Genitourinary   Anemia due to stage 3 chronic kidney disease (HCC)    Chronic, stable. Continue collaboration with hematology as needed. Labs up to date.        Other   History of right knee joint replacement    Replacement on 11/27/2022 -- overall healing well.  Continue collaboration with ortho.        Follow up plan: Return in about 4 weeks (around 01/20/2023) for BP check -- may need to adjust medications if remains on lower side.

## 2022-12-23 NOTE — Assessment & Plan Note (Signed)
Chronic, ongoing. Unable to take statin due to rhabdo with associated AKI. Continue taking Praulent at this time.  Lipid panel up to date.

## 2022-12-23 NOTE — Assessment & Plan Note (Signed)
Chronic, ongoing. A1c stable at 6.3% today, continue diet focus.  Continue current medication regimen and adjust as needed. Continue collaboration with nephrology locally and at [facility], recent labs reviewed.  Urine ALB 21 October 2022, continue ARB.  Consider addition of Farxiga in future for kidney and heart health.  Return in 3 months.  - Up to date on eye and foot exam - Vaccinations up to date - ARB on board and Praluent -- can not take statin

## 2022-12-23 NOTE — Assessment & Plan Note (Signed)
Chronic, ongoing noted on CT imaging 01/26/17. Unable to tolerate statins. Continue taking Praluent every 14 days.  

## 2022-12-23 NOTE — Assessment & Plan Note (Signed)
Recommend she schedule sleep study, she had to cancel recently due to surgery.

## 2022-12-23 NOTE — Assessment & Plan Note (Signed)
Chronic, stable. Continue collaboration with hematology as needed. Labs up to date.

## 2022-12-23 NOTE — Assessment & Plan Note (Addendum)
Replacement on 11/27/2022 -- overall healing well.  Continue collaboration with ortho.

## 2022-12-23 NOTE — Assessment & Plan Note (Signed)
Chronic, stable. BP well below goal in office, may need to adjust medications. Recommend BP checks at home a few times per week at home. DASH diet at home. Continue current medication regimen and adjust as needed. Labs: BMP.  Urine ALB 21 October 2022, continue Irbesartan. Return in 4 weeks for BP check, may stop Amlodipine.

## 2022-12-23 NOTE — Assessment & Plan Note (Signed)
Chronic, ongoing. A1c stable at 6.3%, praised for this. She wishes to continue diet control and weight management at this time vs restart of medication. Gabapentin nightly for neuropathy. Check blood sugar daily at home. Labs: A1c today.  Return in 3 months.  Consider SGLT2 for kidney health.

## 2022-12-24 DIAGNOSIS — R600 Localized edema: Secondary | ICD-10-CM | POA: Diagnosis not present

## 2022-12-24 DIAGNOSIS — M25561 Pain in right knee: Secondary | ICD-10-CM | POA: Diagnosis not present

## 2022-12-24 LAB — BASIC METABOLIC PANEL
BUN/Creatinine Ratio: 21 (ref 12–28)
BUN: 28 mg/dL — ABNORMAL HIGH (ref 8–27)
CO2: 22 mmol/L (ref 20–29)
Calcium: 10 mg/dL (ref 8.7–10.3)
Chloride: 102 mmol/L (ref 96–106)
Creatinine, Ser: 1.34 mg/dL — ABNORMAL HIGH (ref 0.57–1.00)
Glucose: 95 mg/dL (ref 70–99)
Potassium: 4.4 mmol/L (ref 3.5–5.2)
Sodium: 140 mmol/L (ref 134–144)
eGFR: 40 mL/min/{1.73_m2} — ABNORMAL LOW (ref >59)

## 2022-12-24 NOTE — Progress Notes (Signed)
Contacted via MyChart   Good afternoon Tina Mcdonald, your labs have returned and kidney function is remaining stable with chronic kidney disease stage 3b.  Continue to follow with kidney doctor and ensure good water intake at home.:) Keep being amazing!!  Thank you for allowing me to participate in your care.  I appreciate you. Kindest regards, Delayza Lungren

## 2022-12-30 DIAGNOSIS — R600 Localized edema: Secondary | ICD-10-CM | POA: Diagnosis not present

## 2022-12-30 DIAGNOSIS — M25561 Pain in right knee: Secondary | ICD-10-CM | POA: Diagnosis not present

## 2023-01-02 DIAGNOSIS — R600 Localized edema: Secondary | ICD-10-CM | POA: Diagnosis not present

## 2023-01-02 DIAGNOSIS — M25561 Pain in right knee: Secondary | ICD-10-CM | POA: Diagnosis not present

## 2023-01-06 DIAGNOSIS — M25561 Pain in right knee: Secondary | ICD-10-CM | POA: Diagnosis not present

## 2023-01-06 DIAGNOSIS — R600 Localized edema: Secondary | ICD-10-CM | POA: Diagnosis not present

## 2023-01-07 DIAGNOSIS — Z96651 Presence of right artificial knee joint: Secondary | ICD-10-CM | POA: Diagnosis not present

## 2023-01-09 DIAGNOSIS — R600 Localized edema: Secondary | ICD-10-CM | POA: Diagnosis not present

## 2023-01-09 DIAGNOSIS — M25561 Pain in right knee: Secondary | ICD-10-CM | POA: Diagnosis not present

## 2023-01-12 ENCOUNTER — Other Ambulatory Visit: Payer: Self-pay | Admitting: Nurse Practitioner

## 2023-01-14 NOTE — Telephone Encounter (Signed)
Requested Prescriptions  Pending Prescriptions Disp Refills   gabapentin (NEURONTIN) 100 MG capsule [Pharmacy Med Name: Gabapentin 100 MG Oral Capsule] 200 capsule 0    Sig: TAKE 2 CAPSULES BY MOUTH AT  BEDTIME     Neurology: Anticonvulsants - gabapentin Failed - 01/12/2023 10:13 PM      Failed - Cr in normal range and within 360 days    Creatinine, Ser  Date Value Ref Range Status  12/23/2022 1.34 (H) 0.57 - 1.00 mg/dL Final   Creatinine, Urine  Date Value Ref Range Status  10/24/2020 28 mg/dL Final         Passed - Completed PHQ-2 or PHQ-9 in the last 360 days      Passed - Valid encounter within last 12 months    Recent Outpatient Visits           3 weeks ago CKD stage 3 due to type 2 diabetes mellitus (HCC)   [place] [facility] [place], Hosston T, NP   3 months ago CKD stage 3 due to type 2 diabetes mellitus (HCC)   [place] [facility] Lamar, Ona T, NP   5 months ago Subacute cough   [place] [facility] [place], Piketon T, NP   5 months ago Subacute cough   [place] [facility] [place], Harman T, NP   7 months ago Other diabetic neurological complication associated with type 2 diabetes mellitus (HCC)   [place] [facility] [place], Dorie Rank, NP       Future Appointments             In 6 days [name], Dorie Rank, NP [place] [facility]

## 2023-01-18 NOTE — Patient Instructions (Incomplete)
Be Involved in Caring For Your Health:  Taking Medications When medications are taken as directed, they can greatly improve your health. But if they are not taken as prescribed, they may not work. In some cases, not taking them correctly can be harmful. To help ensure your treatment remains effective and safe, understand your medications and how to take them. Bring your medications to each visit for review by your provider.  Your lab results, notes, and after visit summary will be available on My Chart. We strongly encourage you to use this feature. If lab results are abnormal the clinic will contact you with the appropriate steps. If the clinic does not contact you assume the results are satisfactory. You can always view your results on My Chart. If you have questions regarding your health or results, please contact the clinic during office hours. You can also ask questions on My Chart.  We at [facility] are grateful that you chose us to provide your care. We strive to provide evidence-based and compassionate care and are always looking for feedback. If you get a survey from the clinic please complete this so we can hear your opinions.  DASH Eating Plan DASH stands for Dietary Approaches to Stop Hypertension. The DASH eating plan is a healthy eating plan that has been shown to: Lower high blood pressure (hypertension). Reduce your risk for type 2 diabetes, heart disease, and stroke. Help with weight loss. What are tips for following this plan? Reading food labels Check food labels for the amount of salt (sodium) per serving. Choose foods with less than 5 percent of the Daily Value (DV) of sodium. In general, foods with less than 300 milligrams (mg) of sodium per serving fit into this eating plan. To find whole grains, look for the word "whole" as the first word in the ingredient list. Shopping Buy products labeled as "low-sodium" or "no salt added." Buy fresh foods. Avoid canned  foods and pre-made or frozen meals. Cooking Try not to add salt when you cook. Use salt-free seasonings or herbs instead of table salt or sea salt. Check with your health care provider or pharmacist before using salt substitutes. Do not fry foods. Cook foods in healthy ways, such as baking, boiling, grilling, roasting, or broiling. Cook using oils that are good for your heart. These include olive, canola, avocado, soybean, and sunflower oil. Meal planning  Eat a balanced diet. This should include: 4 or more servings of fruits and 4 or more servings of vegetables each day. Try to fill half of your plate with fruits and vegetables. 6-8 servings of whole grains each day. 6 or less servings of lean meat, poultry, or fish each day. 1 oz is 1 serving. A 3 oz (85 g) serving of meat is about the same size as the palm of your hand. One egg is 1 oz (28 g). 2-3 servings of low-fat dairy each day. One serving is 1 cup (237 mL). 1 serving of nuts, seeds, or beans 5 times each week. 2-3 servings of heart-healthy fats. Healthy fats called omega-3 fatty acids are found in foods such as walnuts, flaxseeds, fortified milks, and eggs. These fats are also found in cold-water fish, such as sardines, salmon, and mackerel. Limit how much you eat of: Canned or prepackaged foods. Food that is high in trans fat, such as fried foods. Food that is high in saturated fat, such as fatty meat. Desserts and other sweets, sugary drinks, and other foods with added sugar. Full-fat   dairy products. Do not salt foods before eating. Do not eat more than 4 egg yolks a week. Try to eat at least 2 vegetarian meals a week. Eat more home-cooked food and less restaurant, buffet, and fast food. Lifestyle When eating at a restaurant, ask if your food can be made with less salt or no salt. If you drink alcohol: Limit how much you have to: 0-1 drink a day if you are female. 0-2 drinks a day if you are female. Know how much alcohol is in  your drink. In the [place] one drink is one 12 oz bottle of beer (355 mL), one 5 oz glass of wine (148 mL), or one 1 oz glass of hard liquor (44 mL). General information Avoid eating more than 2,300 mg of salt a day. If you have hypertension, you may need to reduce your sodium intake to 1,500 mg a day. Work with your provider to stay at a healthy body weight or lose weight. Ask what the best weight range is for you. On most days of the week, get at least 30 minutes of exercise that causes your heart to beat faster. This may include walking, swimming, or biking. Work with your provider or dietitian to adjust your eating plan to meet your specific calorie needs. What foods should I eat? Fruits All fresh, dried, or frozen fruit. Canned fruits that are in their natural juice and do not have sugar added to them. Vegetables Fresh or frozen vegetables that are raw, steamed, roasted, or grilled. Low-sodium or reduced-sodium tomato and vegetable juice. Low-sodium or reduced-sodium tomato sauce and tomato paste. Low-sodium or reduced-sodium canned vegetables. Grains Whole-grain or whole-wheat bread. Whole-grain or whole-wheat pasta. Brown rice. Oatmeal. Quinoa. Bulgur. Whole-grain and low-sodium cereals. Pita bread. Low-fat, low-sodium crackers. Whole-wheat flour tortillas. Meats and other proteins Skinless chicken or turkey. Ground chicken or turkey. Pork with fat trimmed off. Fish and seafood. Egg whites. Dried beans, peas, or lentils. Unsalted nuts, nut butters, and seeds. Unsalted canned beans. Lean cuts of beef with fat trimmed off. Low-sodium, lean precooked or cured meat, such as sausages or meat loaves. Dairy Low-fat (1%) or fat-free (skim) milk. Reduced-fat, low-fat, or fat-free cheeses. Nonfat, low-sodium ricotta or cottage cheese. Low-fat or nonfat yogurt. Low-fat, low-sodium cheese. Fats and oils Soft margarine without trans fats. Vegetable oil. Reduced-fat, low-fat, or light mayonnaise and salad  dressings (reduced-sodium). Canola, safflower, olive, avocado, soybean, and sunflower oils. Avocado. Seasonings and condiments Herbs. Spices. Seasoning mixes without salt. Other foods Unsalted popcorn and pretzels. Fat-free sweets. The items listed above may not be all the foods and drinks you can have. Talk to a dietitian to learn more. What foods should I avoid? Fruits Canned fruit in a light or heavy syrup. Fried fruit. Fruit in cream or butter sauce. Vegetables Creamed or fried vegetables. Vegetables in a cheese sauce. Regular canned vegetables that are not marked as low-sodium or reduced-sodium. Regular canned tomato sauce and paste that are not marked as low-sodium or reduced-sodium. Regular tomato and vegetable juices that are not marked as low-sodium or reduced-sodium. Pickles. Olives. Grains Baked goods made with fat, such as croissants, muffins, or some breads. Dry pasta or rice meal packs. Meats and other proteins Fatty cuts of meat. Ribs. Fried meat. Bacon. Bologna, salami, and other precooked or cured meats, such as sausages or meat loaves, that are not lean and low in sodium. Fat from the back of a pig (fatback). Bratwurst. Salted nuts and seeds. Canned beans with added salt. Canned   or smoked fish. Whole eggs or egg yolks. Chicken or turkey with skin. Dairy Whole or 2% milk, cream, and half-and-half. Whole or full-fat cream cheese. Whole-fat or sweetened yogurt. Full-fat cheese. Nondairy creamers. Whipped toppings. Processed cheese and cheese spreads. Fats and oils Butter. Stick margarine. Lard. Shortening. Ghee. Bacon fat. Tropical oils, such as coconut, palm kernel, or palm oil. Seasonings and condiments Onion salt, garlic salt, seasoned salt, table salt, and sea salt. Worcestershire sauce. Tartar sauce. Barbecue sauce. Teriyaki sauce. Soy sauce, including reduced-sodium soy sauce. Steak sauce. Canned and packaged gravies. Fish sauce. Oyster sauce. Cocktail sauce. Store-bought  horseradish. Ketchup. Mustard. Meat flavorings and tenderizers. Bouillon cubes. Hot sauces. Pre-made or packaged marinades. Pre-made or packaged taco seasonings. Relishes. Regular salad dressings. Other foods Salted popcorn and pretzels. The items listed above may not be all the foods and drinks you should avoid. Talk to a dietitian to learn more. Where to find more information National Heart, Lung, and Blood Institute (NHLBI): nhlbi.nih.gov American Heart Association (AHA): heart.org Academy of Nutrition and Dietetics: eatright.org National Kidney Foundation (NKF): kidney.org This information is not intended to replace advice given to you by your health care provider. Make sure you discuss any questions you have with your health care provider. Document Revised: 06/26/2022 Document Reviewed: 06/26/2022 Elsevier Patient Education  2024 Elsevier Inc.  

## 2023-01-20 ENCOUNTER — Encounter: Payer: Self-pay | Admitting: Nurse Practitioner

## 2023-01-20 ENCOUNTER — Ambulatory Visit: Payer: Medicare Other | Admitting: Nurse Practitioner

## 2023-01-20 VITALS — BP 105/64 | HR 68 | Temp 97.9°F | Ht 60.0 in | Wt 156.0 lb

## 2023-01-20 DIAGNOSIS — I152 Hypertension secondary to endocrine disorders: Secondary | ICD-10-CM | POA: Diagnosis not present

## 2023-01-20 DIAGNOSIS — E1159 Type 2 diabetes mellitus with other circulatory complications: Secondary | ICD-10-CM

## 2023-01-20 NOTE — Progress Notes (Signed)
BP 105/64   Pulse 68   Temp 97.9 F (36.6 C) (Oral)   Ht 5' (1.524 m)   Wt 156 lb (70.8 kg)   LMP  (LMP Unknown)   SpO2 98%   BMI 30.47 kg/m    Subjective:    Patient ID: Tina Mcdonald, female    DOB: 1940/08/28, 82 y.o.   MRN: [id number]  HPI: Tina Mcdonald is a 82 y.o. female  Chief Complaint  Patient presents with   Hypertension   HYPERTENSION with Chronic Kidney Disease Follow-up today for lower BPs noted on last visit.  Recheck today and consideration for stopping Amlodipine.  Is currently taking Amlodipine 5 MG, Carvedilol 25 MG BID, Irbesartan 300 MG daily + Chlorthalidone 25 MG daily.  She endorses being tired often and occasionally dizzy. Hypertension status: stable  Satisfied with current treatment? yes Duration of hypertension: chronic BP monitoring frequency:  not checking BP range:  BP medication side effects:  no Medication compliance: good compliance Previous BP meds: Aspirin: no Recurrent headaches: no Visual changes: no Palpitations: no Dyspnea:  occasional Chest pain: no Lower extremity edema: no Dizzy/lightheaded:  occasional    Relevant past medical, surgical, family and social history reviewed and updated as indicated. Interim medical history since our last visit reviewed. Allergies and medications reviewed and updated.  Review of Systems  Constitutional:  Positive for fatigue. Negative for activity change, appetite change, diaphoresis and fever.  Respiratory:  Negative for cough, chest tightness, shortness of breath and wheezing.   Cardiovascular:  Negative for chest pain, palpitations and leg swelling.  Gastrointestinal: Negative.   Endocrine: Negative for cold intolerance, heat intolerance, polydipsia, polyphagia and polyuria.  Neurological:  Positive for dizziness. Negative for speech difficulty, weakness, light-headedness, numbness and headaches.  Psychiatric/Behavioral: Negative.      Per HPI unless specifically indicated  above     Objective:    BP 105/64   Pulse 68   Temp 97.9 F (36.6 C) (Oral)   Ht 5' (1.524 m)   Wt 156 lb (70.8 kg)   LMP  (LMP Unknown)   SpO2 98%   BMI 30.47 kg/m   Wt Readings from Last 3 Encounters:  01/20/23 156 lb (70.8 kg)  12/23/22 157 lb 9.6 oz (71.5 kg)  09/23/22 166 lb 4.8 oz (75.4 kg)    Physical Exam Vitals and nursing note reviewed.  Constitutional:      General: She is awake. She is not in acute distress.    Appearance: She is well-developed and well-groomed. She is obese. She is not ill-appearing or toxic-appearing.  HENT:     Head: Normocephalic.     Right Ear: Hearing normal.     Left Ear: Hearing normal.  Eyes:     General: Lids are normal.        Right eye: No discharge.        Left eye: No discharge.     Conjunctiva/sclera: Conjunctivae normal.     Pupils: Pupils are equal, round, and reactive to light.  Neck:     Thyroid: No thyromegaly.     Vascular: No carotid bruit.  Cardiovascular:     Rate and Rhythm: Normal rate and regular rhythm.     Heart sounds: Murmur heard.     Systolic murmur is present with a grade of 2/6.     No gallop.  Pulmonary:     Effort: Pulmonary effort is normal. No accessory muscle usage or respiratory distress.     Breath  sounds: Normal breath sounds.  Abdominal:     General: Bowel sounds are normal.     Palpations: Abdomen is soft. There is no hepatomegaly or splenomegaly.  Musculoskeletal:     Cervical back: Normal range of motion and neck supple.     Right lower leg: No edema.     Left lower leg: No edema.  Lymphadenopathy:     Cervical: No cervical adenopathy.  Skin:    General: Skin is warm and dry.  Neurological:     Mental Status: She is alert and oriented to person, place, and time.  Psychiatric:        Attention and Perception: Attention normal.        Mood and Affect: Mood normal.        Speech: Speech normal.        Behavior: Behavior normal. Behavior is cooperative.        Thought Content:  Thought content normal.    Results for orders placed or performed in visit on 12/23/22  Bayer DCA Hb A1c Waived  Result Value Ref Range   HB A1C (BAYER DCA - WAIVED) 6.3 (H) 4.8 - 5.6 %  Basic metabolic panel  Result Value Ref Range   Glucose 95 70 - 99 mg/dL   BUN 28 (H) 8 - 27 mg/dL   Creatinine, Ser 1.61 (H) 0.57 - 1.00 mg/dL   eGFR 40 (L) >09 UE/AVW/0.98   BUN/Creatinine Ratio 21 12 - 28   Sodium 140 134 - 144 mmol/L   Potassium 4.4 3.5 - 5.2 mmol/L   Chloride 102 96 - 106 mmol/L   CO2 22 20 - 29 mmol/L   Calcium 10.0 8.7 - 10.3 mg/dL      Assessment & Plan:   Problem List Items Addressed This Visit       Cardiovascular and Mediastinum   Hypertension associated with diabetes (HCC) - Primary    Chronic, stable. BP well below goal in office still on check today. Recommend BP checks at home a few times per week at home. DASH diet at home. We will stop Amlodipine and continue remainder of heart medications -- discussed with her and offered education on change + wrote instructions down for her.  She is to message provider if levels consistently >130/80 at home with changes. Labs: up to date.  Urine ALB 21 October 2022, continue Irbesartan. Return in October for visit.        Follow up plan: Return in about 2 months (around 03/25/2023) for T2DM, HTN/HLD, CKD, ANEMIA.

## 2023-01-20 NOTE — Assessment & Plan Note (Signed)
Chronic, stable. BP well below goal in office still on check today. Recommend BP checks at home a few times per week at home. DASH diet at home. We will stop Amlodipine and continue remainder of heart medications -- discussed with her and offered education on change + wrote instructions down for her.  She is to message provider if levels consistently >130/80 at home with changes. Labs: up to date.  Urine ALB 21 October 2022, continue Irbesartan. Return in October for visit.

## 2023-01-26 ENCOUNTER — Inpatient Hospital Stay: Payer: Medicare Other | Attending: Oncology

## 2023-01-26 DIAGNOSIS — D631 Anemia in chronic kidney disease: Secondary | ICD-10-CM | POA: Diagnosis not present

## 2023-01-26 DIAGNOSIS — D563 Thalassemia minor: Secondary | ICD-10-CM

## 2023-01-26 DIAGNOSIS — R5383 Other fatigue: Secondary | ICD-10-CM

## 2023-01-26 DIAGNOSIS — N183 Chronic kidney disease, stage 3 unspecified: Secondary | ICD-10-CM | POA: Insufficient documentation

## 2023-01-26 DIAGNOSIS — N1831 Chronic kidney disease, stage 3a: Secondary | ICD-10-CM

## 2023-01-26 LAB — CBC WITH DIFFERENTIAL/PLATELET
Abs Immature Granulocytes: 0.01 10*3/uL (ref 0.00–0.07)
Basophils Absolute: 0 10*3/uL (ref 0.0–0.1)
Basophils Relative: 0 %
Eosinophils Absolute: 0.3 10*3/uL (ref 0.0–0.5)
Eosinophils Relative: 6 %
HCT: 30.5 % — ABNORMAL LOW (ref 36.0–46.0)
Hemoglobin: 9.2 g/dL — ABNORMAL LOW (ref 12.0–15.0)
Immature Granulocytes: 0 %
Lymphocytes Relative: 29 %
Lymphs Abs: 1.4 10*3/uL (ref 0.7–4.0)
MCH: 24.8 pg — ABNORMAL LOW (ref 26.0–34.0)
MCHC: 30.2 g/dL (ref 30.0–36.0)
MCV: 82.2 fL (ref 80.0–100.0)
Monocytes Absolute: 0.5 10*3/uL (ref 0.1–1.0)
Monocytes Relative: 11 %
Neutro Abs: 2.6 10*3/uL (ref 1.7–7.7)
Neutrophils Relative %: 54 %
Platelets: 213 10*3/uL (ref 150–400)
RBC: 3.71 MIL/uL — ABNORMAL LOW (ref 3.87–5.11)
RDW: 14.8 % (ref 11.5–15.5)
WBC: 4.7 10*3/uL (ref 4.0–10.5)
nRBC: 0 % (ref 0.0–0.2)

## 2023-01-26 LAB — IRON AND TIBC
Iron: 45 ug/dL (ref 28–170)
Saturation Ratios: 20 % (ref 10.4–31.8)
TIBC: 224 ug/dL — ABNORMAL LOW (ref 250–450)
UIBC: 179 ug/dL

## 2023-01-26 LAB — COMPREHENSIVE METABOLIC PANEL
ALT: 11 U/L (ref 0–44)
AST: 16 U/L (ref 15–41)
Albumin: 3.7 g/dL (ref 3.5–5.0)
Alkaline Phosphatase: 47 U/L (ref 38–126)
Anion gap: 7 (ref 5–15)
BUN: 27 mg/dL — ABNORMAL HIGH (ref 8–23)
CO2: 24 mmol/L (ref 22–32)
Calcium: 9.2 mg/dL (ref 8.9–10.3)
Chloride: 105 mmol/L (ref 98–111)
Creatinine, Ser: 1.2 mg/dL — ABNORMAL HIGH (ref 0.44–1.00)
GFR, Estimated: 46 mL/min — ABNORMAL LOW (ref >60)
Glucose, Bld: 106 mg/dL — ABNORMAL HIGH (ref 70–99)
Potassium: 4 mmol/L (ref 3.5–5.1)
Sodium: 136 mmol/L (ref 135–145)
Total Bilirubin: 0.5 mg/dL (ref 0.3–1.2)
Total Protein: 7.1 g/dL (ref 6.5–8.1)

## 2023-01-26 LAB — FERRITIN: Ferritin: 898 ng/mL — ABNORMAL HIGH (ref 11–307)

## 2023-01-29 ENCOUNTER — Inpatient Hospital Stay (HOSPITAL_BASED_OUTPATIENT_CLINIC_OR_DEPARTMENT_OTHER): Payer: Medicare Other | Admitting: Oncology

## 2023-01-29 ENCOUNTER — Encounter: Payer: Self-pay | Admitting: Oncology

## 2023-01-29 VITALS — BP 123/59 | HR 77 | Temp 96.9°F | Resp 18 | Wt 161.0 lb

## 2023-01-29 DIAGNOSIS — D631 Anemia in chronic kidney disease: Secondary | ICD-10-CM

## 2023-01-29 DIAGNOSIS — I429 Cardiomyopathy, unspecified: Secondary | ICD-10-CM | POA: Diagnosis not present

## 2023-01-29 DIAGNOSIS — N1831 Chronic kidney disease, stage 3a: Secondary | ICD-10-CM | POA: Diagnosis not present

## 2023-01-29 DIAGNOSIS — G4730 Sleep apnea, unspecified: Secondary | ICD-10-CM | POA: Diagnosis not present

## 2023-01-29 DIAGNOSIS — I129 Hypertensive chronic kidney disease with stage 1 through stage 4 chronic kidney disease, or unspecified chronic kidney disease: Secondary | ICD-10-CM | POA: Diagnosis not present

## 2023-01-29 DIAGNOSIS — N183 Chronic kidney disease, stage 3 unspecified: Secondary | ICD-10-CM | POA: Diagnosis not present

## 2023-01-29 DIAGNOSIS — I517 Cardiomegaly: Secondary | ICD-10-CM | POA: Diagnosis not present

## 2023-01-29 DIAGNOSIS — E119 Type 2 diabetes mellitus without complications: Secondary | ICD-10-CM | POA: Diagnosis not present

## 2023-01-29 DIAGNOSIS — R7989 Other specified abnormal findings of blood chemistry: Secondary | ICD-10-CM

## 2023-01-29 DIAGNOSIS — D563 Thalassemia minor: Secondary | ICD-10-CM

## 2023-01-29 DIAGNOSIS — I10 Essential (primary) hypertension: Secondary | ICD-10-CM | POA: Diagnosis not present

## 2023-01-29 DIAGNOSIS — R0602 Shortness of breath: Secondary | ICD-10-CM | POA: Diagnosis not present

## 2023-01-29 DIAGNOSIS — R053 Chronic cough: Secondary | ICD-10-CM | POA: Diagnosis not present

## 2023-01-29 DIAGNOSIS — R600 Localized edema: Secondary | ICD-10-CM | POA: Diagnosis not present

## 2023-01-29 DIAGNOSIS — K219 Gastro-esophageal reflux disease without esophagitis: Secondary | ICD-10-CM | POA: Diagnosis not present

## 2023-01-29 DIAGNOSIS — I2089 Other forms of angina pectoris: Secondary | ICD-10-CM | POA: Diagnosis not present

## 2023-01-29 NOTE — Progress Notes (Signed)
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 Lab Results  Component Value Date   HGB 9.2 (L) 01/26/2023   TIBC 224 (L) 01/26/2023   IRONPCTSAT 20 01/26/2023   FERRITIN 898 (H) 01/26/2023  Hemoglobin is less than 10 Recommend erythropoietin replacement therapy. Potential side effects were reviewed and discussed with patient.  She agrees with the plan. Recommend Retacrit 10,000 units every 4 weeks  Elevated ferritin Reactive versus iron overload. Recommend patient hold off Slow Fe and vitamin C supplementation for now. 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  Chronic microcytosis.   Orders Placed This Encounter  Procedures   CBC with Differential (Cancer Center Only)    Standing Status:   Future    Standing Expiration Date:   01/29/2024   Follow-up in 4 weeks. All questions were answered. The patient knows to call the clinic with any problems, questions or concerns.  Rickard Patience, MD, PhD [facility] Hematology Oncology 01/29/2023    INTERVAL HISTORY Tina Mcdonald is a 82 y.o. female who has above history reviewed by me today presents for follow up visit for management of anemia Patient reports feeling fatigued recently.   Patient has underwent right knee replacement surgery in June 2024.[name]  Review of Systems  Constitutional:  Positive for fatigue. Negative for appetite change, chills and fever.  HENT:   Negative for hearing loss and voice change.    Eyes:  Negative for eye problems.  Respiratory:  Negative for chest tightness and cough.   Cardiovascular:  Negative for chest pain.  Gastrointestinal:  Negative for abdominal distention, abdominal pain and blood in stool.  Endocrine: Negative for hot flashes.  Genitourinary:  Negative for difficulty urinating and frequency.   Musculoskeletal:  Negative for arthralgias.  Skin:  Negative for itching and rash.  Neurological:  Negative for extremity weakness.  Hematological:  Negative for adenopathy.  Psychiatric/Behavioral:  Negative for confusion.      MEDICAL HISTORY:  Past Medical History:  Diagnosis Date   Anemia    Arthritis    Chronic kidney disease    Colon polyps    Diabetes mellitus without complication (HCC)    Diabetic neuropathy (HCC)    Dyspnea    GERD (gastroesophageal reflux disease)    Hyperlipemia    Hypertension    Osteoarthritis    Radiculopathy    weakness in right leg   Sleep apnea    does not have her cpap anymore   Stress incontinence    Urticaria    Vitamin B 12 deficiency    Wears glasses     SURGICAL HISTORY: Past Surgical History:  Procedure Laterality Date   ABDOMINAL HYSTERECTOMY     APPENDECTOMY     BREAST BIOPSY Right    right-neg   BREAST BIOPSY Left    us/bx/clip-neg   CARPAL TUNNEL RELEASE     right   COLONOSCOPY     COLONOSCOPY WITH PROPOFOL N/A 12/10/2015   Procedure: COLONOSCOPY WITH PROPOFOL;  Surgeon: Midge Minium, MD;  Location: [facility];  Service: Endoscopy;  Laterality: N/A;  Diabetic - oral meds Sleep Apnea LATEX allergy   COLONOSCOPY WITH PROPOFOL N/A 01/17/2019   Procedure: COLONOSCOPY WITH PROPOFOL;  Surgeon: Pasty Spillers, MD;  Location: [facility];  Service: Endoscopy;  Laterality: N/A;   CYSTOSCOPY     DILATION AND CURETTAGE OF UTERUS     ESOPHAGOGASTRODUODENOSCOPY (EGD) WITH PROPOFOL N/A 01/17/2019   Procedure: ESOPHAGOGASTRODUODENOSCOPY (EGD) WITH PROPOFOL;  Surgeon: Pasty Spillers, MD;  Location: [facility];  Service: Endoscopy;  Laterality: N/A;   EYE SURGERY Bilateral    lens implant   GIVENS CAPSULE STUDY N/A 05/04/2019   Procedure: GIVENS CAPSULE STUDY;  Surgeon: Pasty Spillers, MD;  Location: [facility];  Service: Endoscopy;  Laterality: N/A;   LUMBAR FUSION  2009   POLYPECTOMY  12/10/2015   Procedure: POLYPECTOMY;  Surgeon: Midge Minium, MD;  Location: [facility];  Service: Endoscopy;;   POLYPECTOMY  01/17/2019   Procedure: POLYPECTOMY;  Surgeon: Pasty Spillers, MD;  Location: [facility];  Service: Endoscopy;;   RESECTION DISTAL CLAVICAL Right 11/25/2013   Procedure: RIGHT SHOULDER OPEN RESECTION DISTAL CLAVICAL EXCISION SOFT TISSUE TUMOR SHOULDER DEEP SUBFASCIAL INTRAMUSCULAR/DEBRIDEMENT/ROTATOR CUFF REPAIR/BICEPS TENODESIS/MANIPULATION;  Surgeon: Sheral Apley, MD;  Location: [facility];  Service: Orthopedics;  Laterality: Right;   SHOULDER ARTHROSCOPY WITH ROTATOR CUFF REPAIR AND SUBACROMIAL DECOMPRESSION Right 11/25/2013   Procedure: SHOULDER ARTHROSCOPY WITH DEBRIDEMENT ROTATOR CUFF REPAIR  A;  Surgeon: Sheral Apley, MD;  Location: [facility];  Service: Orthopedics;  Laterality: Right;    SOCIAL HISTORY: Social History   Socioeconomic History   Marital status: Single    Spouse name: Not on file   Number of children: Not on file   Years of education: Not on file   Highest education level: 12th grade  Occupational History   Occupation: retired  Tobacco Use   Smoking status: Former    Current packs/day: 0.00    Average packs/day: 1 pack/day for 15.0 years (15.0 ttl pk-yrs)    Types: Cigarettes    Start date: 11/23/1982    Quit date: 11/22/1997    Years since quitting: 25.2   Smokeless tobacco: Never  Vaping Use   Vaping status: Never Used  Substance and Sexual Activity   Alcohol use: No   Drug use: No   Sexual activity: Not Currently  Other Topics Concern   Not on file   Social History Narrative   Not on file   Social Determinants of Health   Financial Resource Strain: Low Risk  (12/23/2022)   Overall Financial Resource Strain (CARDIA)    Difficulty of Paying Living Expenses: Not very hard  Food Insecurity: No Food Insecurity (12/23/2022)   Hunger Vital Sign    Worried About Running Out of Food in the Last Year: Never true    Ran Out of Food in the Last Year: Never true  Transportation Needs: Unmet Transportation Needs (12/23/2022)   PRAPARE - Transportation    Lack of Transportation (Medical): No    Lack of Transportation (Non-Medical): Yes  Physical Activity: Inactive (12/23/2022)   Exercise Vital Sign    Days of Exercise per Week: 0 days    Minutes of Exercise per Session: 0 min  Stress: No Stress Concern Present (12/23/2022)   [employer] of Occupational Health - Occupational Stress Questionnaire    Feeling of Stress : Not at all  Social Connections: Moderately Integrated (12/23/2022)   Social Connection and  Isolation Panel [NHANES]    Frequency of Communication with Friends and Family: More than three times a week    Frequency of Social Gatherings with Friends and Family: Three times a week    Attends Religious Services: More than 4 times per year    Active Member of Clubs or Organizations: Yes    Attends Banker Meetings: More than 4 times per year    Marital Status: Never married  Intimate Partner Violence: Not At Risk (03/31/2022)   Humiliation, Afraid, Rape, and Kick questionnaire    Fear of Current or Ex-Partner: No    Emotionally Abused: No    Physically Abused: No    Sexually Abused: No    FAMILY HISTORY: Family History  Problem Relation Age of Onset   Heart disease Mother    Diabetes Mother    Hypertension Mother    Heart disease Father    Hypertension Father    Diabetes Sister    Hypertension Sister    Heart disease Sister    Kidney disease Sister    Diabetes Sister    Hypertension Sister    Kidney disease  Sister    Non-Hodgkin's lymphoma Maternal Uncle    Breast cancer Cousin 50    ALLERGIES:  is allergic to statins, contrast media [iodinated contrast media], penicillins, sulfa antibiotics, aloe, latex, neosporin [neomycin-bacitracin zn-polymyx], and other.  MEDICATIONS:  Current Outpatient Medications  Medication Sig Dispense Refill   acetaminophen (TYLENOL) 325 MG tablet Take 650 mg by mouth every 6 (six) hours as needed.     albuterol (VENTOLIN HFA) 108 (90 Base) MCG/ACT inhaler Inhale 2 puffs into the lungs every 6 (six) hours as needed for wheezing or shortness of breath. 18 g 1   Alirocumab (PRALUENT) 150 MG/ML SOAJ Inject 150 mg into the skin every 14 (fourteen) days. 2 mL 12   blood glucose meter kit and supplies KIT Dispense based on patient and insurance preference. Use up to four times daily as directed. (FOR ICD-9 250.00, 250.01). 1 each 0   carvedilol (COREG) 25 MG tablet Take 25 mg by mouth 2 (two) times daily.     chlorthalidone (HYGROTON) 25 MG tablet Take 25 mg by mouth daily.     Cholecalciferol (VITAMIN D-1000 MAX ST) 25 MCG (1000 UT) tablet Take by mouth.     diclofenac sodium (VOLTAREN) 1 % GEL Apply 2 g topically 2 (two) times daily as needed. 1 Tube 2   ferrous sulfate (SLOW IRON) 160 (50 Fe) MG TBCR SR tablet Take by mouth daily.     fluticasone (FLONASE) 50 MCG/ACT nasal spray Place into both nostrils.     gabapentin (NEURONTIN) 100 MG capsule TAKE 2 CAPSULES BY MOUTH AT  BEDTIME 200 capsule 0   glucose blood test strip Check daily. ICD 10 E11.9 100 each 12   irbesartan (AVAPRO) 300 MG tablet TAKE 1 TABLET BY MOUTH ONCE DAILY NEW  BLOOD  PRESSURE  MEDICINE 90 tablet 0   omeprazole (PRILOSEC) 20 MG capsule Take 1 capsule (20 mg total) by mouth 2 (two) times daily before a meal. 180 capsule 4   triamcinolone cream (KENALOG) 0.1 % APPLY  CREAM EXTERNALLY TWICE DAILY 30 g 0   vitamin C (ASCORBIC ACID) 250 MG tablet Take 250 mg by mouth every other day.     diltiazem  (CARDIZEM CD) 120 MG 24 hr capsule Take by mouth daily. (Patient not taking: Reported on 01/29/2023)     irbesartan (AVAPRO) 150 MG tablet Take  150 mg by mouth daily. (Patient not taking: Reported on 01/29/2023)     No current facility-administered medications for this visit.     PHYSICAL EXAMINATION: ECOG PERFORMANCE STATUS: 1 - Symptomatic but completely ambulatory Vitals:   01/29/23 1334  BP: (!) 123/59  Pulse: 77  Resp: 18  Temp: (!) 96.9 F (36.1 C)   Filed Weights   01/29/23 1334  Weight: 161 lb (73 kg)    Physical Exam Constitutional:      General: She is not in acute distress. HENT:     Head: Normocephalic and atraumatic.  Eyes:     General: No scleral icterus.    Pupils: Pupils are equal, round, and reactive to light.  Cardiovascular:     Rate and Rhythm: Normal rate and regular rhythm.  Pulmonary:     Effort: Pulmonary effort is normal. No respiratory distress.     Breath sounds: No wheezing.  Abdominal:     General: Bowel sounds are normal. There is no distension.     Palpations: Abdomen is soft.  Musculoskeletal:        General: No deformity. Normal range of motion.     Cervical back: Normal range of motion and neck supple.  Skin:    General: Skin is warm and dry.     Findings: No erythema or rash.  Neurological:     Mental Status: She is alert and oriented to person, place, and time.     Cranial Nerves: No cranial nerve deficit.     Coordination: Coordination normal.  Psychiatric:        Mood and Affect: Mood normal.      LABORATORY DATA:  I have reviewed the data as listed Lab Results  Component Value Date   WBC 4.7 01/26/2023   HGB 9.2 (L) 01/26/2023   HCT 30.5 (L) 01/26/2023   MCV 82.2 01/26/2023   PLT 213 01/26/2023   Recent Labs    06/10/22 0928 09/23/22 0831 12/23/22 1050 01/26/23 1142  NA 141 143 140 136  K 3.6 4.0 4.4 4.0  CL 104 104 102 105  CO2 22 24 22 24   GLUCOSE 91 113* 95 106*  BUN 31* 21 28* 27*  CREATININE 1.21*  1.18* 1.34* 1.20*  CALCIUM 10.0 9.4 10.0 9.2  GFRNONAA  --   --   --  46*  PROT 6.6 6.3  --  7.1  ALBUMIN 4.5 3.9  --  3.7  AST 15 14  --  16  ALT 11 12  --  11  ALKPHOS 60 66  --  47  BILITOT 0.2 <0.2  --  0.5   Iron/TIBC/Ferritin/ %Sat    Component Value Date/Time   IRON 45 01/26/2023 1142   IRON 48 10/08/2022 0955   TIBC 224 (L) 01/26/2023 1142   TIBC 200 (L) 10/08/2022 0955   FERRITIN 898 (H) 01/26/2023 1142   FERRITIN 1,004 (H) 10/08/2022 0955   IRONPCTSAT 20 01/26/2023 1142   IRONPCTSAT 24 10/08/2022 0955

## 2023-01-29 NOTE — Assessment & Plan Note (Signed)
No intervention at this point.  Chronic microcytosis.

## 2023-01-29 NOTE — Assessment & Plan Note (Signed)
Labs are reviewed and discussed with patient. Lab Results  Component Value Date   HGB 9.2 (L) 01/26/2023   TIBC 224 (L) 01/26/2023   IRONPCTSAT 20 01/26/2023   FERRITIN 898 (H) 01/26/2023  Hemoglobin is less than 10 Recommend erythropoietin replacement therapy. Potential side effects were reviewed and discussed with patient.  She agrees with the plan. Recommend Retacrit 10,000 units every 4 weeks

## 2023-01-29 NOTE — Progress Notes (Signed)
Pt here for follow up. Pt reports she had knee replacement this past June

## 2023-01-29 NOTE — Assessment & Plan Note (Signed)
Reactive versus iron overload. Recommend patient hold off Slow Fe and vitamin C supplementation for now. Check hemochromatosis gene mutation

## 2023-01-30 ENCOUNTER — Inpatient Hospital Stay: Payer: Medicare Other

## 2023-01-30 VITALS — BP 132/61

## 2023-01-30 DIAGNOSIS — D631 Anemia in chronic kidney disease: Secondary | ICD-10-CM | POA: Diagnosis not present

## 2023-01-30 DIAGNOSIS — N183 Chronic kidney disease, stage 3 unspecified: Secondary | ICD-10-CM | POA: Diagnosis not present

## 2023-01-30 MED ORDER — EPOETIN ALFA-EPBX 10000 UNIT/ML IJ SOLN
10000.0000 [IU] | Freq: Once | INTRAMUSCULAR | Status: AC
  Administered 2023-01-30: 10000 [IU] via SUBCUTANEOUS
  Filled 2023-01-30: qty 1

## 2023-02-12 ENCOUNTER — Encounter: Payer: Self-pay | Admitting: Oncology

## 2023-02-17 ENCOUNTER — Ambulatory Visit (HOSPITAL_BASED_OUTPATIENT_CLINIC_OR_DEPARTMENT_OTHER): Payer: Medicare Other | Admitting: Internal Medicine

## 2023-02-17 ENCOUNTER — Encounter (HOSPITAL_BASED_OUTPATIENT_CLINIC_OR_DEPARTMENT_OTHER): Payer: Self-pay | Admitting: Internal Medicine

## 2023-02-17 VITALS — BP 138/78 | HR 79 | Ht 62.0 in | Wt 160.5 lb

## 2023-02-17 DIAGNOSIS — Z8739 Personal history of other diseases of the musculoskeletal system and connective tissue: Secondary | ICD-10-CM

## 2023-02-17 DIAGNOSIS — E1169 Type 2 diabetes mellitus with other specified complication: Secondary | ICD-10-CM | POA: Diagnosis not present

## 2023-02-17 DIAGNOSIS — E785 Hyperlipidemia, unspecified: Secondary | ICD-10-CM | POA: Diagnosis not present

## 2023-02-17 DIAGNOSIS — I2510 Atherosclerotic heart disease of native coronary artery without angina pectoris: Secondary | ICD-10-CM

## 2023-02-17 MED ORDER — EZETIMIBE 10 MG PO TABS: 10.0000 mg | ORAL_TABLET | Freq: Every day | ORAL | 3 refills | Status: DC

## 2023-02-17 NOTE — Patient Instructions (Addendum)
Medication Instructions:  START- Zetia 10 mg by mouth daily  *If you need a refill on your cardiac medications before your next appointment, please call your pharmacy*   Lab Work: Fasting NMR, LP(a) and CK  If you have labs (blood work) drawn today and your tests are completely normal, you will receive your results only by: MyChart Message (if you have MyChart) OR A paper copy in the mail If you have any lab test that is abnormal or we need to change your treatment, we will call you to review the results.   Testing/Procedures: None Ordered   Follow-Up: At [facility], you and your health needs are our priority.  As part of our continuing mission to provide you with exceptional heart care, we have created designated Provider Care Teams.  These Care Teams include your primary Cardiologist (physician) and Advanced Practice Providers (APPs -  Physician Assistants and Nurse Practitioners) who all work together to provide you with the care you need, when you need it.  We recommend signing up for the patient portal called "MyChart".  Sign up information is provided on this After Visit Summary.  MyChart is used to connect with patients for Virtual Visits (Telemedicine).  Patients are able to view lab/test results, encounter notes, upcoming appointments, etc.  Non-urgent messages can be sent to your provider as well.   To learn more about what you can do with MyChart, go to [email or web address].    Your next appointment:   3-4 month(s)  Provider:   K. Italy Hilty, MD    Other Instructions

## 2023-02-17 NOTE — Progress Notes (Signed)
LIPID CLINIC CONSULT NOTE  Chief Complaint:  Manage dyslipidemia  Primary Care Physician: Marjie Skiff, NP  Primary Cardiologist:  None  HPI:  Tina Mcdonald is a 82 y.o. female who is being seen today for the evaluation of dyslipidemia at the request of Marjie Skiff, NP.  This is a pleasant 82 year old female currently referred for evaluation and management of dyslipidemia by her primary care provider.  She is also a patient of Dr. Juliann Pares with the [facility].  She had a CT scan of the coronaries back in December, this demonstrated mild nonobstructive coronary disease, with a calcium score of 137, 57th percentile for age and sex matched controls.  Based on those findings she needs aggressive lipid-lowering with a target LDL less than 70.  Unfortunately it sounds like about 2 to 3 years ago she had rhabdomyolysis in the setting of chronic kidney disease.  Her CK was greater than the thousand and she was advised to stop her statin.  Subsequent follow-up CK measurements were normal.  She had then been started on Praluent, apparently 75 mg every 2 weeks initially and then increase to 150 mg every 2 weeks last year.  Her most recent lipid in 09/2022 showed total cholesterol 162, triglycerides 135, HDL 53 and LDL 85.  In December 2022 her LDL was 195.  This is highly suggestive of a genetic dyslipidemia.  She may also have an elevated LP(a) however that has not yet been assessed.  PMHx:  Past Medical History:  Diagnosis Date   Anemia    Arthritis    Chronic kidney disease    Colon polyps    Diabetes mellitus without complication (HCC)    Diabetic neuropathy (HCC)    Dyspnea    GERD (gastroesophageal reflux disease)    Hyperlipemia    Hypertension    Osteoarthritis    Radiculopathy    weakness in right leg   Sleep apnea    does not have her cpap anymore   Stress incontinence    Urticaria    Vitamin B 12 deficiency    Wears glasses     Past Surgical History:   Procedure Laterality Date   ABDOMINAL HYSTERECTOMY     APPENDECTOMY     BREAST BIOPSY Right    right-neg   BREAST BIOPSY Left    us/bx/clip-neg   CARPAL TUNNEL RELEASE     right   COLONOSCOPY     COLONOSCOPY WITH PROPOFOL N/A 12/10/2015   Procedure: COLONOSCOPY WITH PROPOFOL;  Surgeon: Midge Minium, MD;  Location: [facility];  Service: Endoscopy;  Laterality: N/A;  Diabetic - oral meds Sleep Apnea LATEX allergy   COLONOSCOPY WITH PROPOFOL N/A 01/17/2019   Procedure: COLONOSCOPY WITH PROPOFOL;  Surgeon: Pasty Spillers, MD;  Location: [facility];  Service: Endoscopy;  Laterality: N/A;   CYSTOSCOPY     DILATION AND CURETTAGE OF UTERUS     ESOPHAGOGASTRODUODENOSCOPY (EGD) WITH PROPOFOL N/A 01/17/2019   Procedure: ESOPHAGOGASTRODUODENOSCOPY (EGD) WITH PROPOFOL;  Surgeon: Pasty Spillers, MD;  Location: [facility];  Service: Endoscopy;  Laterality: N/A;   EYE SURGERY Bilateral    lens implant   GIVENS CAPSULE STUDY N/A 05/04/2019   Procedure: GIVENS CAPSULE STUDY;  Surgeon: Pasty Spillers, MD;  Location: [facility];  Service: Endoscopy;  Laterality: N/A;   LUMBAR FUSION  2009   POLYPECTOMY  12/10/2015   Procedure: POLYPECTOMY;  Surgeon: Midge Minium, MD;  Location: [facility];  Service: Endoscopy;;  POLYPECTOMY  01/17/2019   Procedure: POLYPECTOMY;  Surgeon: Pasty Spillers, MD;  Location: [facility];  Service: Endoscopy;;   RESECTION DISTAL CLAVICAL Right 11/25/2013   Procedure: RIGHT SHOULDER OPEN RESECTION DISTAL CLAVICAL EXCISION SOFT TISSUE TUMOR SHOULDER DEEP SUBFASCIAL INTRAMUSCULAR/DEBRIDEMENT/ROTATOR CUFF REPAIR/BICEPS TENODESIS/MANIPULATION;  Surgeon: Sheral Apley, MD;  Location: [facility];  Service: Orthopedics;  Laterality: Right;   SHOULDER ARTHROSCOPY WITH ROTATOR CUFF REPAIR AND SUBACROMIAL DECOMPRESSION Right 11/25/2013   Procedure: SHOULDER ARTHROSCOPY WITH DEBRIDEMENT ROTATOR CUFF REPAIR  A;   Surgeon: Sheral Apley, MD;  Location:  SURGERY CENTER;  Service: Orthopedics;  Laterality: Right;    FAMHx:  Family History  Problem Relation Age of Onset   Heart disease Mother    Diabetes Mother    Hypertension Mother    Heart disease Father    Hypertension Father    Diabetes Sister    Hypertension Sister    Heart disease Sister    Kidney disease Sister    Diabetes Sister    Hypertension Sister    Kidney disease Sister    Non-Hodgkin's lymphoma Maternal Uncle    Breast cancer Cousin 60    SOCHx:   reports that she quit smoking about 25 years ago. Her smoking use included cigarettes. She started smoking about 40 years ago. She has a 15 pack-year smoking history. She has never used smokeless tobacco. She reports that she does not drink alcohol and does not use drugs.  ALLERGIES:  Allergies  Allergen Reactions   Statins Other (See Comments)    Rhabdomyolysis   Contrast Media [Iodinated Contrast Media] Itching   Penicillins Itching    Has patient had a PCN reaction causing immediate rash, facial/tongue/throat swelling, SOB or lightheadedness with hypotension: Yes Has patient had a PCN reaction causing severe rash involving mucus membranes or skin necrosis: Unknown Has patient had a PCN reaction that required hospitalization: Yes Has patient had a PCN reaction occurring within the last 10 years: Yes If all of the above answers are "NO", then may proceed with Cephalosporin use.   Sulfa Antibiotics Itching   Aloe Itching and Rash   Latex Rash   Neosporin [Neomycin-Bacitracin Zn-Polymyx] Itching and Rash   Other Rash    Pt has reaction to high acidity foods.     ROS: Pertinent items noted in HPI and remainder of comprehensive ROS otherwise negative.  HOME MEDS: Current Outpatient Medications on File Prior to Visit  Medication Sig Dispense Refill   acetaminophen (TYLENOL) 325 MG tablet Take 650 mg by mouth every 6 (six) hours as needed.     albuterol  (VENTOLIN HFA) 108 (90 Base) MCG/ACT inhaler Inhale 2 puffs into the lungs every 6 (six) hours as needed for wheezing or shortness of breath. 18 g 1   Alirocumab (PRALUENT) 150 MG/ML SOAJ Inject 150 mg into the skin every 14 (fourteen) days. 2 mL 12   blood glucose meter kit and supplies KIT Dispense based on patient and insurance preference. Use up to four times daily as directed. (FOR ICD-9 250.00, 250.01). 1 each 0   chlorthalidone (HYGROTON) 25 MG tablet Take 25 mg by mouth daily.     Cholecalciferol (VITAMIN D-1000 MAX ST) 25 MCG (1000 UT) tablet Take by mouth.     diclofenac sodium (VOLTAREN) 1 % GEL Apply 2 g topically 2 (two) times daily as needed. 1 Tube 2   diltiazem (CARDIZEM CD) 120 MG 24 hr capsule Take by mouth daily.  fluticasone (FLONASE) 50 MCG/ACT nasal spray Place into both nostrils.     gabapentin (NEURONTIN) 100 MG capsule TAKE 2 CAPSULES BY MOUTH AT  BEDTIME 200 capsule 0   glucose blood test strip Check daily. ICD 10 E11.9 100 each 12   irbesartan (AVAPRO) 150 MG tablet Take 150 mg by mouth daily.     omeprazole (PRILOSEC) 20 MG capsule Take 1 capsule (20 mg total) by mouth 2 (two) times daily before a meal. 180 capsule 4   triamcinolone cream (KENALOG) 0.1 % APPLY  CREAM EXTERNALLY TWICE DAILY 30 g 0   carvedilol (COREG) 25 MG tablet Take 25 mg by mouth 2 (two) times daily.     No current facility-administered medications on file prior to visit.    LABS/IMAGING: No results found for this or any previous visit (from the past 48 hour(s)). No results found.  LIPID PANEL:    Component Value Date/Time   CHOL 162 09/23/2022 0831   CHOL 223 (H) 09/01/2019 1526   TRIG 135 09/23/2022 0831   TRIG 161 (H) 09/01/2019 1526   HDL 53 09/23/2022 0831   VLDL 32 (H) 09/01/2019 1526   LDLCALC 85 09/23/2022 0831    WEIGHTS: Wt Readings from Last 3 Encounters:  02/17/23 160 lb 8 oz (72.8 kg)  01/29/23 161 lb (73 kg)  01/20/23 156 lb (70.8 kg)    VITALS: BP 138/78 (BP  Location: Left Arm, Patient Position: Sitting, Cuff Size: Normal)   Pulse 79   Ht 5\' 2"  (1.575 m)   Wt 160 lb 8 oz (72.8 kg)   LMP  (LMP Unknown)   SpO2 96%   BMI 29.36 kg/m   EXAM: Deferred  EKG: Deferred  ASSESSMENT: Probable familial hyperlipidemia, 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   Ms. Vanallen has a probable familial hyperlipidemia with a high LDL greater than 190 untreated.  She has done well on Praluent without any side effects or elevation in CK however this monotherapy is not potent enough to get her to a target LDL less than 70.  Recent labs put her LDL at 85 I think she would benefit from additional therapy.  We discussed adding ezetimibe 10 mg daily and she is in agreement with that.  She will need a repeat CK in about 2 weeks to make sure that that is not an issue and otherwise we will plan repeat lipids in about 3 months and follow-up with me afterwards.  Thanks again for the kind referral.  Chrystie Nose, MD, [facility]  [place]  [facility]  Medical Director of the [facility] Lipid Disorders &  Cardiovascular Risk Reduction Clinic Diplomate of the American Board of Clinical Lipidology Attending Cardiologist  Direct Dial: [phone]  Fax: [phone]  Website:  [email or web address] 02/17/2023, 1:08 PM

## 2023-02-26 ENCOUNTER — Inpatient Hospital Stay: Payer: Medicare Other

## 2023-02-26 ENCOUNTER — Encounter: Payer: Self-pay | Admitting: Oncology

## 2023-02-26 ENCOUNTER — Inpatient Hospital Stay: Payer: Medicare Other | Admitting: Oncology

## 2023-02-26 ENCOUNTER — Inpatient Hospital Stay: Payer: Medicare Other | Attending: Oncology

## 2023-02-26 VITALS — BP 135/66 | HR 70 | Temp 97.3°F | Resp 18 | Wt 161.8 lb

## 2023-02-26 DIAGNOSIS — N1831 Chronic kidney disease, stage 3a: Secondary | ICD-10-CM | POA: Diagnosis not present

## 2023-02-26 DIAGNOSIS — D631 Anemia in chronic kidney disease: Secondary | ICD-10-CM | POA: Insufficient documentation

## 2023-02-26 DIAGNOSIS — D563 Thalassemia minor: Secondary | ICD-10-CM

## 2023-02-26 DIAGNOSIS — N183 Chronic kidney disease, stage 3 unspecified: Secondary | ICD-10-CM | POA: Insufficient documentation

## 2023-02-26 DIAGNOSIS — R7989 Other specified abnormal findings of blood chemistry: Secondary | ICD-10-CM | POA: Diagnosis not present

## 2023-02-26 DIAGNOSIS — E1122 Type 2 diabetes mellitus with diabetic chronic kidney disease: Secondary | ICD-10-CM | POA: Insufficient documentation

## 2023-02-26 LAB — CBC WITH DIFFERENTIAL (CANCER CENTER ONLY)
Abs Immature Granulocytes: 0.01 10*3/uL (ref 0.00–0.07)
Basophils Absolute: 0 10*3/uL (ref 0.0–0.1)
Basophils Relative: 0 %
Eosinophils Absolute: 0.1 10*3/uL (ref 0.0–0.5)
Eosinophils Relative: 3 %
HCT: 34.8 % — ABNORMAL LOW (ref 36.0–46.0)
Hemoglobin: 10.4 g/dL — ABNORMAL LOW (ref 12.0–15.0)
Immature Granulocytes: 0 %
Lymphocytes Relative: 26 %
Lymphs Abs: 1.2 10*3/uL (ref 0.7–4.0)
MCH: 25 pg — ABNORMAL LOW (ref 26.0–34.0)
MCHC: 29.9 g/dL — ABNORMAL LOW (ref 30.0–36.0)
MCV: 83.7 fL (ref 80.0–100.0)
Monocytes Absolute: 0.5 10*3/uL (ref 0.1–1.0)
Monocytes Relative: 12 %
Neutro Abs: 2.7 10*3/uL (ref 1.7–7.7)
Neutrophils Relative %: 59 %
Platelet Count: 227 10*3/uL (ref 150–400)
RBC: 4.16 MIL/uL (ref 3.87–5.11)
RDW: 15.4 % (ref 11.5–15.5)
WBC Count: 4.6 10*3/uL (ref 4.0–10.5)
nRBC: 0 % (ref 0.0–0.2)

## 2023-02-26 NOTE — Assessment & Plan Note (Signed)
 No intervention at this point.  Chronic microcytosis.

## 2023-02-26 NOTE — Assessment & Plan Note (Signed)
Reactive versus iron overload. Recommend patient hold off Slow Fe and vitamin C supplementation for now. Negative hemochromatosis gene mutation

## 2023-02-26 NOTE — Progress Notes (Signed)
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 Lab Results  Component Value Date   HGB 10.4 (L) 02/26/2023   TIBC 224 (L) 01/26/2023   IRONPCTSAT 20 01/26/2023   FERRITIN 898 (H) 01/26/2023  Hemoglobin is >10 Hold off retacrit.  Continue monthly lab and retacrit if hb<=10   Elevated ferritin Reactive versus iron overload. Recommend patient hold off Slow Fe and vitamin C supplementation for now. Negative hemochromatosis gene mutation  Thalassemia alpha carrier No intervention at this point.  Chronic microcytosis.   Orders Placed This Encounter  Procedures   CBC with Differential (Cancer Center Only)    Standing Status:   Future    Standing Expiration Date:   02/26/2024   Ferritin    Standing Status:   Future    Standing Expiration Date:   02/26/2024   Retic Panel    Standing Status:   Future    Standing Expiration Date:   02/26/2024   Iron and TIBC    Standing Status:   Future    Standing Expiration Date:   02/26/2024   Hemoglobin and Hematocrit (Cancer Center Only)    Standing Status:   Standing    Number of Occurrences:   3    Standing Expiration Date:   02/26/2024   Follow up  Lab H&H in 4w, 8w, 12w +/- retacrit  4 months lab prior MD +/- retacrit  All questions were answered. The patient knows to call the clinic with any problems, questions or concerns.  Rickard Patience, MD, PhD [facility] Hematology Oncology 02/26/2023    INTERVAL HISTORY Tina Mcdonald is a 82 y.o. female who has above history reviewed  by me today presents for follow up visit for management of anemia Patient reports feeling well at baseline. No new complaints.  Patient has underwent right knee replacement surgery in June 2024.[name]  Review of Systems  Constitutional:  Positive for fatigue. Negative for appetite change, chills and fever.  HENT:   Negative for hearing loss and voice change.   Eyes:  Negative for eye problems.  Respiratory:  Negative for chest tightness and cough.   Cardiovascular:  Negative for chest pain.  Gastrointestinal:  Negative for abdominal distention, abdominal pain and blood in stool.  Endocrine: Negative for hot flashes.  Genitourinary:  Negative for difficulty urinating and frequency.   Musculoskeletal:  Negative for arthralgias.  Skin:  Negative for itching and rash.  Neurological:  Negative for extremity weakness.  Hematological:  Negative for adenopathy.  Psychiatric/Behavioral:  Negative for confusion.      MEDICAL HISTORY:  Past Medical History:  Diagnosis Date   Anemia    Arthritis    Chronic kidney disease    Colon polyps    Diabetes mellitus without complication (HCC)    Diabetic neuropathy (HCC)    Dyspnea    GERD (gastroesophageal reflux disease)    Hyperlipemia    Hypertension    Osteoarthritis    Radiculopathy    weakness in right leg   Sleep apnea    does not have her cpap  anymore   Stress incontinence    Urticaria    Vitamin B 12 deficiency    Wears glasses     SURGICAL HISTORY: Past Surgical History:  Procedure Laterality Date   ABDOMINAL HYSTERECTOMY     APPENDECTOMY     BREAST BIOPSY Right    right-neg   BREAST BIOPSY Left    us/bx/clip-neg   CARPAL TUNNEL RELEASE     right   COLONOSCOPY     COLONOSCOPY WITH PROPOFOL N/A 12/10/2015   Procedure: COLONOSCOPY WITH PROPOFOL;  Surgeon: Midge Minium, MD;  Location: [facility];  Service: Endoscopy;  Laterality: N/A;  Diabetic - oral meds Sleep Apnea LATEX allergy   COLONOSCOPY WITH PROPOFOL N/A  01/17/2019   Procedure: COLONOSCOPY WITH PROPOFOL;  Surgeon: Pasty Spillers, MD;  Location: [facility];  Service: Endoscopy;  Laterality: N/A;   CYSTOSCOPY     DILATION AND CURETTAGE OF UTERUS     ESOPHAGOGASTRODUODENOSCOPY (EGD) WITH PROPOFOL N/A 01/17/2019   Procedure: ESOPHAGOGASTRODUODENOSCOPY (EGD) WITH PROPOFOL;  Surgeon: Pasty Spillers, MD;  Location: [facility];  Service: Endoscopy;  Laterality: N/A;   EYE SURGERY Bilateral    lens implant   GIVENS CAPSULE STUDY N/A 05/04/2019   Procedure: GIVENS CAPSULE STUDY;  Surgeon: Pasty Spillers, MD;  Location: [facility];  Service: Endoscopy;  Laterality: N/A;   LUMBAR FUSION  2009   POLYPECTOMY  12/10/2015   Procedure: POLYPECTOMY;  Surgeon: Midge Minium, MD;  Location: [facility];  Service: Endoscopy;;   POLYPECTOMY  01/17/2019   Procedure: POLYPECTOMY;  Surgeon: Pasty Spillers, MD;  Location: [facility];  Service: Endoscopy;;   RESECTION DISTAL CLAVICAL Right 11/25/2013   Procedure: RIGHT SHOULDER OPEN RESECTION DISTAL CLAVICAL EXCISION SOFT TISSUE TUMOR SHOULDER DEEP SUBFASCIAL INTRAMUSCULAR/DEBRIDEMENT/ROTATOR CUFF REPAIR/BICEPS TENODESIS/MANIPULATION;  Surgeon: Sheral Apley, MD;  Location: [facility];  Service: Orthopedics;  Laterality: Right;   SHOULDER ARTHROSCOPY WITH ROTATOR CUFF REPAIR AND SUBACROMIAL DECOMPRESSION Right 11/25/2013   Procedure: SHOULDER ARTHROSCOPY WITH DEBRIDEMENT ROTATOR CUFF REPAIR  A;  Surgeon: Sheral Apley, MD;  Location: [facility];  Service: Orthopedics;  Laterality: Right;    SOCIAL HISTORY: Social History   Socioeconomic History   Marital status: Single    Spouse name: Not on file   Number of children: Not on file   Years of education: Not on file   Highest education level: 12th grade  Occupational History   Occupation: retired  Tobacco Use   Smoking status: Former    Current packs/day: 0.00    Average  packs/day: 1 pack/day for 15.0 years (15.0 ttl pk-yrs)    Types: Cigarettes    Start date: 11/23/1982    Quit date: 11/22/1997    Years since quitting: 25.2   Smokeless tobacco: Never  Vaping Use   Vaping status: Never Used  Substance and Sexual Activity   Alcohol use: No   Drug use: No   Sexual activity: Not Currently  Other Topics Concern   Not on file  Social History Narrative   Not on file   Social Determinants of Health   Financial Resource Strain: Low Risk  (12/23/2022)   Overall Financial Resource Strain (CARDIA)    Difficulty of Paying Living Expenses: Not very hard  Food Insecurity: No Food Insecurity (12/23/2022)   Hunger Vital Sign    Worried About Running Out of Food in the Last Year: Never true    Ran Out of Food in the Last  Year: Never true  Transportation Needs: Unmet Transportation Needs (12/23/2022)   PRAPARE - Transportation    Lack of Transportation (Medical): No    Lack of Transportation (Non-Medical): Yes  Physical Activity: Inactive (12/23/2022)   Exercise Vital Sign    Days of Exercise per Week: 0 days    Minutes of Exercise per Session: 0 min  Stress: No Stress Concern Present (12/23/2022)   [employer] of Occupational Health - Occupational Stress Questionnaire    Feeling of Stress : Not at all  Social Connections: Moderately Integrated (12/23/2022)   Social Connection and Isolation Panel [NHANES]    Frequency of Communication with Friends and Family: More than three times a week    Frequency of Social Gatherings with Friends and Family: Three times a week    Attends Religious Services: More than 4 times per year    Active Member of Clubs or Organizations: Yes    Attends Banker Meetings: More than 4 times per year    Marital Status: Never married  Intimate Partner Violence: Not At Risk (03/31/2022)   Humiliation, Afraid, Rape, and Kick questionnaire    Fear of Current or Ex-Partner: No    Emotionally Abused: No    Physically Abused: No     Sexually Abused: No    FAMILY HISTORY: Family History  Problem Relation Age of Onset   Heart disease Mother    Diabetes Mother    Hypertension Mother    Heart disease Father    Hypertension Father    Diabetes Sister    Hypertension Sister    Heart disease Sister    Kidney disease Sister    Diabetes Sister    Hypertension Sister    Kidney disease Sister    Non-Hodgkin's lymphoma Maternal Uncle    Breast cancer Cousin 50    ALLERGIES:  is allergic to statins, contrast media [iodinated contrast media], penicillins, sulfa antibiotics, aloe, latex, neosporin [neomycin-bacitracin zn-polymyx], and other.  MEDICATIONS:  Current Outpatient Medications  Medication Sig Dispense Refill   acetaminophen (TYLENOL) 325 MG tablet Take 650 mg by mouth every 6 (six) hours as needed.     albuterol (VENTOLIN HFA) 108 (90 Base) MCG/ACT inhaler Inhale 2 puffs into the lungs every 6 (six) hours as needed for wheezing or shortness of breath. 18 g 1   Alirocumab (PRALUENT) 150 MG/ML SOAJ Inject 150 mg into the skin every 14 (fourteen) days. 2 mL 12   blood glucose meter kit and supplies KIT Dispense based on patient and insurance preference. Use up to four times daily as directed. (FOR ICD-9 250.00, 250.01). 1 each 0   carvedilol (COREG) 25 MG tablet Take 25 mg by mouth 2 (two) times daily.     Cholecalciferol (VITAMIN D-1000 MAX ST) 25 MCG (1000 UT) tablet Take by mouth.     diclofenac sodium (VOLTAREN) 1 % GEL Apply 2 g topically 2 (two) times daily as needed. 1 Tube 2   diltiazem (CARDIZEM CD) 120 MG 24 hr capsule Take by mouth daily.     ezetimibe (ZETIA) 10 MG tablet Take 1 tablet (10 mg total) by mouth daily. 90 tablet 3   fluticasone (FLONASE) 50 MCG/ACT nasal spray Place into both nostrils.     gabapentin (NEURONTIN) 100 MG capsule TAKE 2 CAPSULES BY MOUTH AT  BEDTIME 200 capsule 0   glucose blood test strip Check daily. ICD 10 E11.9 100 each 12   irbesartan (AVAPRO) 150 MG tablet Take 150 mg by  mouth daily.  omeprazole (PRILOSEC) 20 MG capsule Take 1 capsule (20 mg total) by mouth 2 (two) times daily before a meal. 180 capsule 4   triamcinolone cream (KENALOG) 0.1 % APPLY  CREAM EXTERNALLY TWICE DAILY 30 g 0   chlorthalidone (HYGROTON) 25 MG tablet Take 25 mg by mouth daily.     No current facility-administered medications for this visit.     PHYSICAL EXAMINATION: ECOG PERFORMANCE STATUS: 1 - Symptomatic but completely ambulatory Vitals:   02/26/23 1410  BP: 135/66  Pulse: 70  Resp: 18  Temp: (!) 97.3 F (36.3 C)  SpO2: 99%   Filed Weights   02/26/23 1410  Weight: 161 lb 12.8 oz (73.4 kg)    Physical Exam Constitutional:      General: She is not in acute distress. HENT:     Head: Normocephalic and atraumatic.  Eyes:     General: No scleral icterus.    Pupils: Pupils are equal, round, and reactive to light.  Cardiovascular:     Rate and Rhythm: Normal rate and regular rhythm.  Pulmonary:     Effort: Pulmonary effort is normal. No respiratory distress.     Breath sounds: No wheezing.  Abdominal:     General: Bowel sounds are normal. There is no distension.     Palpations: Abdomen is soft.  Musculoskeletal:        General: No deformity. Normal range of motion.     Cervical back: Normal range of motion and neck supple.  Skin:    General: Skin is warm and dry.     Findings: No erythema or rash.  Neurological:     Mental Status: She is alert and oriented to person, place, and time.     Cranial Nerves: No cranial nerve deficit.     Coordination: Coordination normal.  Psychiatric:        Mood and Affect: Mood normal.      LABORATORY DATA:  I have reviewed the data as listed    Latest Ref Rng & Units 02/26/2023    2:00 PM 01/26/2023   11:42 AM 10/08/2022    9:55 AM  CBC  WBC 4.0 - 10.5 K/uL 4.6  4.7  4.1   Hemoglobin 12.0 - 15.0 g/dL 40.9  9.2  81.1   Hematocrit 36.0 - 46.0 % 34.8  30.5  33.2   Platelets 150 - 400 K/uL 227  213  230        Latest  Ref Rng & Units 01/26/2023   11:42 AM 12/23/2022   10:50 AM 09/23/2022    8:31 AM  CMP  Glucose 70 - 99 mg/dL 914  95  782   BUN 8 - 23 mg/dL 27  28  21    Creatinine 0.44 - 1.00 mg/dL 9.56  2.13  0.86   Sodium 135 - 145 mmol/L 136  140  143   Potassium 3.5 - 5.1 mmol/L 4.0  4.4  4.0   Chloride 98 - 111 mmol/L 105  102  104   CO2 22 - 32 mmol/L 24  22  24    Calcium 8.9 - 10.3 mg/dL 9.2  57.8  9.4   Total Protein 6.5 - 8.1 g/dL 7.1   6.3   Total Bilirubin 0.3 - 1.2 mg/dL 0.5   <4.6   Alkaline Phos 38 - 126 U/L 47   66   AST 15 - 41 U/L 16   14   ALT 0 - 44 U/L 11   12  Iron/TIBC/Ferritin/ %Sat    Component Value Date/Time   IRON 45 01/26/2023 1142   IRON 48 10/08/2022 0955   TIBC 224 (L) 01/26/2023 1142   TIBC 200 (L) 10/08/2022 0955   FERRITIN 898 (H) 01/26/2023 1142   FERRITIN 1,004 (H) 10/08/2022 0955   IRONPCTSAT 20 01/26/2023 1142   IRONPCTSAT 24 10/08/2022 0955

## 2023-02-26 NOTE — Assessment & Plan Note (Addendum)
Labs are reviewed and discussed with patient. Lab Results  Component Value Date   HGB 10.4 (L) 02/26/2023   TIBC 224 (L) 01/26/2023   IRONPCTSAT 20 01/26/2023   FERRITIN 898 (H) 01/26/2023  Hemoglobin is >10 Hold off retacrit.  Continue monthly lab and retacrit if hb<=10

## 2023-03-13 ENCOUNTER — Other Ambulatory Visit: Payer: Self-pay | Admitting: Nurse Practitioner

## 2023-03-16 NOTE — Telephone Encounter (Signed)
Lab in date  Requested Prescriptions  Pending Prescriptions Disp Refills   PRALUENT 150 MG/ML SOAJ [Pharmacy Med Name: PRALUENT PEN 150MG /ML] 6 mL 3    Sig: INJECT 150MG  SUBCUTANEOUSLY  EVERY 2 WEEKS     Cardiovascular: PCSK9 Inhibitors Failed - 03/13/2023 10:13 PM      Failed - Lipid Panel completed within the last 12 months    Cholesterol, Total  Date Value Ref Range Status  09/23/2022 162 100 - 199 mg/dL Final   Cholesterol Piccolo, Waived  Date Value Ref Range Status  09/01/2019 223 (H) <200 mg/dL Final    Comment:                            Desirable                <200                         Borderline High      200- 239                         High                     >239    LDL Chol Calc (NIH)  Date Value Ref Range Status  09/23/2022 85 0 - 99 mg/dL Final   HDL  Date Value Ref Range Status  09/23/2022 53 >39 mg/dL Final   Triglycerides  Date Value Ref Range Status  09/23/2022 135 0 - 149 mg/dL Final   Triglycerides Piccolo,Waived  Date Value Ref Range Status  09/01/2019 161 (H) <150 mg/dL Final    Comment:                            Normal                   <150                         Borderline High     150 - 199                         High                200 - 499                         Very High                >499          Passed - Valid encounter within last 12 months    Recent Outpatient Visits           1 month ago Hypertension associated with diabetes (HCC)   [place] [facility] [place], [place] T, NP   2 months ago CKD stage 3 due to type 2 diabetes mellitus (HCC)   [place] [facility] [place], [place] T, NP   5 months ago CKD stage 3 due to type 2 diabetes mellitus (HCC)    [facility] [place], Corrie Dandy T, NP   7 months ago Subacute cough    [facility] [place], De Lamere T, NP   7 months ago Subacute cough     [facility] [place], Dorie Rank, NP       Future Appointments             In 1 week Marjie Skiff, NP [facility], [facility]

## 2023-03-22 NOTE — Patient Instructions (Signed)
Be Involved in Caring For Your Health:  Taking Medications When medications are taken as directed, they can greatly improve your health. But if they are not taken as prescribed, they may not work. In some cases, not taking them correctly can be harmful. To help ensure your treatment remains effective and safe, understand your medications and how to take them. Bring your medications to each visit for review by your provider.  Your lab results, notes, and after visit summary will be available on My Chart. We strongly encourage you to use this feature. If lab results are abnormal the clinic will contact you with the appropriate steps. If the clinic does not contact you assume the results are satisfactory. You can always view your results on My Chart. If you have questions regarding your health or results, please contact the clinic during office hours. You can also ask questions on My Chart.  We at [facility] are grateful that you chose [place] to provide your care. We strive to provide evidence-based and compassionate care and are always looking for feedback. If you get a survey from the clinic please complete this so we can hear your opinions.  Diabetic Nephropathy  Diabetic nephropathy is kidney disease that is caused by diabetes (diabetes mellitus). Kidneys are organs that filter and clean blood and get rid of body waste products and extra fluid. Diabetes can cause gradual kidney damage over many years. Diabetic nephropathy that keeps getting worse can lead to kidney failure. What are the causes? This condition is caused by diabetes that is not well controlled with treatment. Having high blood sugar (glucose) for a long time because of diabetes can damage blood vessels in the kidneys and cause them to thicken and become scarred. Those changes prevent the kidneys from working like they should. What increases the risk? This condition is more likely to develop in people with diabetes who: Have  had diabetes for many years. Have high blood pressure. Have high blood glucose levels over a long period of time. Have a family history of kidney disease. Have a history of tobacco use. Have certain genes that are passed from parent to child (inherited). What are the signs or symptoms? This condition may not cause symptoms at first. If you do have symptoms, they may include: Swelling of your hands, feet, or ankles. Weakness. Poor appetite. Nausea. Confusion. Tiredness (fatigue). Trouble sleeping. Dry, itchy skin. If your condition leads to kidney failure, symptoms may include: Vomiting. Shortness of breath. Jerky movements that you cannot control (seizure). Coma. How is this diagnosed? It is important to diagnose this condition before symptoms develop. You may be screened for this condition at a routine health care visit. Screening tests may include: Urine tests. These may be done every year. Urine collection over a 24-hour period to measure kidney function. Blood tests to measure blood glucose levels and kidney function. Regular blood pressure monitoring. If your health care provider suspects diabetic nephropathy, they may: Review your medical history and symptoms. Do a physical exam. Do an ultrasound of your kidneys. Do a procedure to take a sample of kidney tissue for testing (biopsy). How is this treated? The goal of treatment is to prevent or slow down any damage to your kidneys by managing your diabetes. Treatment may include: Controlling your blood pressure. Your target blood pressure may vary. It may depend on your medical conditions, your age, and other factors. To help control blood pressure, you may be prescribed medicines to lower your blood pressure (ACE  inhibitors) or to help your body get rid of excess fluid (diuretics). Controlling your A1c level, also called hemoglobin A1c level.Generally, the goal of treatment is to maintain an A1c level of less than  7%. Controlling your blood glucose. Controlling your blood lipids. If you have high cholesterol, you may need to take lipid-lowering drugs, such as statins. Other treatments may include: Medicines, including insulin injections. Lifestyle changes, such as losing weight, quitting smoking, or making changes to your diet. If your disease progresses to end-stage kidney failure, treatment may include: Dialysis. This is a procedure to filter your blood with a machine. Kidney transplant. Follow these instructions at home: Eating and drinking Eat healthy foods, and eat healthy snacks between meals. Follow instructions from your health care provider about what you may eat and drink. Limit your salt (sodium), protein, or fluid intake as directed. If you drink alcohol: Limit how much you have to: 0-1 drink a day for women who are not pregnant. 0-2 drinks a day for men. Know how much alcohol is in your drink. In the [place] one drink equals one 12 oz bottle of beer (355 mL), one 5 oz glass of wine (148 mL), or one 1 oz glass of hard liquor (44 mL). Lifestyle Do not use any products that contain nicotine or tobacco. These products include cigarettes, chewing tobacco, and vaping devices, such as e-cigarettes. If you need help quitting, ask your health care provider. Maintain a healthy weight. Work with your health care provider to lose weight, if needed. Be physically active every day. Ask your health care provider what type of exercise is best for you. Work with your health care provider to manage your blood pressure. General instructions     Follow your diabetes management plan as directed. Check your blood glucose levels as told by your health care provider. Keep your blood glucose in your target range as told by your health care provider. Have your A1c level checked two or more times a year, or as often as told by your health care provider. Measure your blood pressure regularly at home, as told by  your health care provider. Take over-the-counter and prescription medicines only as told by your health care provider. These include insulin and supplements. Keep all follow-up and routine visits. Make sure you get screening tests as directed. Where to find more information American Diabetes Association: diabetes.org Contact a health care provider if: You have trouble keeping your blood glucose in your goal range. Your blood glucose level is higher than 240 mg/dL (16.1 mmol/L) for 2 days in a row. You have swelling in your hands, ankles, or feet. You feel weak, tired, or dizzy. You have sudden muscle tightening (spasms). You have nausea or vomiting. You feel tired all the time. Get help right away if: You have difficulty staying awake. You faint. You have: A seizure. Severe, painful muscle spasms. Shortness of breath. Chest pain. These symptoms may be an emergency. Get help right away. Call 911. Do not wait to see if the symptoms will go away. Do not drive yourself to the hospital. This information is not intended to replace advice given to you by your health care provider. Make sure you discuss any questions you have with your health care provider. Document Revised: 10/10/2021 Document Reviewed: 10/10/2021 Elsevier Patient Education  2024 [employer].

## 2023-03-23 ENCOUNTER — Other Ambulatory Visit: Payer: Self-pay | Admitting: Nurse Practitioner

## 2023-03-23 ENCOUNTER — Ambulatory Visit (INDEPENDENT_AMBULATORY_CARE_PROVIDER_SITE_OTHER): Payer: Medicare Other | Admitting: Nurse Practitioner

## 2023-03-23 ENCOUNTER — Encounter: Payer: Self-pay | Admitting: Nurse Practitioner

## 2023-03-23 VITALS — BP 136/67 | HR 67 | Ht 62.0 in | Wt 161.8 lb

## 2023-03-23 DIAGNOSIS — R7989 Other specified abnormal findings of blood chemistry: Secondary | ICD-10-CM

## 2023-03-23 DIAGNOSIS — M150 Primary generalized (osteo)arthritis: Secondary | ICD-10-CM

## 2023-03-23 DIAGNOSIS — E1159 Type 2 diabetes mellitus with other circulatory complications: Secondary | ICD-10-CM

## 2023-03-23 DIAGNOSIS — E1122 Type 2 diabetes mellitus with diabetic chronic kidney disease: Secondary | ICD-10-CM

## 2023-03-23 DIAGNOSIS — E1149 Type 2 diabetes mellitus with other diabetic neurological complication: Secondary | ICD-10-CM | POA: Diagnosis not present

## 2023-03-23 DIAGNOSIS — N183 Chronic kidney disease, stage 3 unspecified: Secondary | ICD-10-CM

## 2023-03-23 DIAGNOSIS — E785 Hyperlipidemia, unspecified: Secondary | ICD-10-CM | POA: Diagnosis not present

## 2023-03-23 DIAGNOSIS — E1169 Type 2 diabetes mellitus with other specified complication: Secondary | ICD-10-CM

## 2023-03-23 DIAGNOSIS — Z8739 Personal history of other diseases of the musculoskeletal system and connective tissue: Secondary | ICD-10-CM | POA: Diagnosis not present

## 2023-03-23 DIAGNOSIS — N1831 Chronic kidney disease, stage 3a: Secondary | ICD-10-CM | POA: Diagnosis not present

## 2023-03-23 DIAGNOSIS — I340 Nonrheumatic mitral (valve) insufficiency: Secondary | ICD-10-CM | POA: Diagnosis not present

## 2023-03-23 DIAGNOSIS — M159 Polyosteoarthritis, unspecified: Secondary | ICD-10-CM

## 2023-03-23 DIAGNOSIS — I152 Hypertension secondary to endocrine disorders: Secondary | ICD-10-CM | POA: Diagnosis not present

## 2023-03-23 DIAGNOSIS — Z23 Encounter for immunization: Secondary | ICD-10-CM

## 2023-03-23 NOTE — Progress Notes (Signed)
BP 136/67   Pulse 67   Ht 5\' 2"  (1.575 m)   Wt 161 lb 12.8 oz (73.4 kg)   LMP  (LMP Unknown)   SpO2 99%   BMI 29.59 kg/m    Subjective:    Patient ID: Tina Mcdonald, female    DOB: 19-Mar-1941, 82 y.o.   MRN: [id number]  HPI: Tina Mcdonald is a 82 y.o. female  Chief Complaint  Patient presents with   Diabetes   Hyperlipidemia   Hypertension   Chronic Kidney Disease   Anemia   Back Pain    Patient says she has been having some back discomfort and says it has been bothering her a lot lately. Patient says she has been sitting in her chair doing puzzles for hours at a time. Patient says she has the most discomfort when she is moving more than sitting still. Patient says when she moves it seems stiff.    Knee Pain   Medication Consultation    Patient says she would like to discuss with provider about her medications. Patient says they have stopped and changed a lot of her medications and she is confused and wants to make sure she isn't taking too much medication.    DIABETES A1c 6.3% in July.  No current medications for diabetes, diet focus.  Previously took Metformin.  Taking Gabapentin 200 MG at night for radiculopathy and leg discomfort at night.   Hypoglycemic episodes:no Polydipsia/polyuria: no Visual disturbance: no Chest pain: no Paresthesias: no Glucose Monitoring: yes             Accucheck frequency: not checking             Fasting glucose:              Post prandial:             Evening:             Before meals: Taking Insulin?: no             Long acting insulin:             Short acting insulin: Blood Pressure Monitoring: occasional Retinal Examination: Up To Date -- [employer] Foot Exam: Up to Date Pneumovax: Up to Date Influenza: Up to Date Aspirin: no   HYPERTENSION / HYPERLIPIDEMIA Continues on Carvedilol, Chlorthalidone, Avapro, and started on Diltiazem.  Last visit with cardiology 01/29/23 when discontinued Amlodipine and decreased Irbesartan to  150 MG daily + began Diltiazem at 120 MG daily. History of aortic atherosclerosis noted on past CT 01/26/17.   Major statin reaction in past that led to admission to hospital and AKI. Currently taking Praluent for HLD every 14 days.  Saw lipid clinic on 02/17/23 when they added Zetia -- labs ordered which will obtain today.  MRI heart on 10/16/22 last with mild LVH, EF >65%, mild mitral valve regurgitation, idiopathic hypotrophic subaortic stenosis. Satisfied with current treatment? yes Duration of hypertension: chronic BP monitoring frequency: twice a week BP range: 120-130/70 range BP medication side effects: no Duration of hyperlipidemia: chronic Cholesterol medication side effects: no Cholesterol supplements: none Medication compliance: good compliance Aspirin: no Recent stressors: no Recurrent headaches: no Visual changes: no Palpitations: no Dyspnea: occasional Chest pain: no Lower extremity edema: occasional to right leg due to past surgery Dizzy/lightheaded: no  CHRONIC KIDNEY DISEASE Followed by nephrology [facility], last visit 04/30/22 -- next 04/07/23.  Follows with hematology, Dr. Cathie Hoops, last visit 01/29/23 to stop Slow Fe and Vitamin  C CKD status: stable Medications renally dose: yes Previous renal evaluation: yes Pneumovax:  Up to Date Influenza Vaccine:  Up to Date    Latest Ref Rng & Units 01/26/2023   11:42 AM 12/23/2022   10:50 AM 09/23/2022    8:31 AM  BMP  Glucose 70 - 99 mg/dL 213  95  086   BUN 8 - 23 mg/dL 27  28  21    Creatinine 0.44 - 1.00 mg/dL 5.78  4.69  6.29   BUN/Creat Ratio 12 - 28  21  18    Sodium 135 - 145 mmol/L 136  140  143   Potassium 3.5 - 5.1 mmol/L 4.0  4.4  4.0   Chloride 98 - 111 mmol/L 105  102  104   CO2 22 - 32 mmol/L 24  22  24    Calcium 8.9 - 10.3 mg/dL 9.2  52.8  9.4     BACK AND KNEE PAIN (RIGHT) She had recent right knee replacement on 11/27/22 with Dr. Odis Luster.  She feels her right knee is not always right, as has tingling down front at times  and medial aspect stays sore all the time.  Back pain started two weeks ago. She thinks it is the recliner she sits in all the time.  Takes Tylenol with improvement to knee and back pain.  States if sits too long they get stiff, but no pain with movement. Duration: weeks Mechanism of injury: no trauma Location: bilateral and low back Onset: gradual Severity: 4/10 -- does not last long, gets moving and eases up Quality: aching and throbbing -- stiffness Frequency: intermittent Radiation: none Aggravating factors: prolonged sitting Alleviating factors: nothing, rest, and APAP and moving Status: stable Treatments attempted: Tylenol and moving, Voltaren gel  Relief with NSAIDs?: No NSAIDs Taken Nighttime pain:  a little bit Paresthesias / decreased sensation:  no Bowel / bladder incontinence:  no Fevers:  no Dysuria / urinary frequency:  no   Relevant past medical, surgical, family and social history reviewed and updated as indicated. Interim medical history since our last visit reviewed. Allergies and medications reviewed and updated.  Review of Systems  Constitutional:  Negative for activity change, appetite change, diaphoresis, fatigue and fever.  Respiratory:  Negative for cough, chest tightness, shortness of breath and wheezing.   Cardiovascular:  Negative for chest pain, palpitations and leg swelling.  Gastrointestinal: Negative.   Endocrine: Negative for cold intolerance, heat intolerance, polydipsia, polyphagia and polyuria.  Neurological: Negative.   Psychiatric/Behavioral: Negative.      Per HPI unless specifically indicated above     Objective:    BP 136/67   Pulse 67   Ht 5\' 2"  (1.575 m)   Wt 161 lb 12.8 oz (73.4 kg)   LMP  (LMP Unknown)   SpO2 99%   BMI 29.59 kg/m   Wt Readings from Last 3 Encounters:  03/23/23 161 lb 12.8 oz (73.4 kg)  02/26/23 161 lb 12.8 oz (73.4 kg)  02/17/23 160 lb 8 oz (72.8 kg)    Physical Exam Vitals and nursing note reviewed.   Constitutional:      General: She is awake. She is not in acute distress.    Appearance: She is well-developed and well-groomed. She is obese. She is not ill-appearing or toxic-appearing.  HENT:     Head: Normocephalic.     Right Ear: Hearing normal.     Left Ear: Hearing normal.  Eyes:     General: Lids are normal.  Right eye: No discharge.        Left eye: No discharge.     Conjunctiva/sclera: Conjunctivae normal.     Pupils: Pupils are equal, round, and reactive to light.  Neck:     Thyroid: No thyromegaly.     Vascular: No carotid bruit.  Cardiovascular:     Rate and Rhythm: Normal rate and regular rhythm.     Heart sounds: Murmur heard.     Systolic murmur is present with a grade of 2/6.     No gallop.  Pulmonary:     Effort: Pulmonary effort is normal. No accessory muscle usage or respiratory distress.     Breath sounds: Normal breath sounds.  Abdominal:     General: Bowel sounds are normal.     Palpations: Abdomen is soft. There is no hepatomegaly or splenomegaly.  Musculoskeletal:     Cervical back: Normal range of motion and neck supple.     Lumbar back: Normal.     Right knee: Normal.     Left knee: Normal.     Right lower leg: No edema.     Left lower leg: No edema.  Lymphadenopathy:     Cervical: No cervical adenopathy.  Skin:    General: Skin is warm and dry.  Neurological:     Mental Status: She is alert and oriented to person, place, and time.  Psychiatric:        Attention and Perception: Attention normal.        Mood and Affect: Mood normal.        Speech: Speech normal.        Behavior: Behavior normal. Behavior is cooperative.        Thought Content: Thought content normal.    Results for orders placed or performed in visit on 02/26/23  CBC with Differential (Cancer Center Only)  Result Value Ref Range   WBC Count 4.6 4.0 - 10.5 K/uL   RBC 4.16 3.87 - 5.11 MIL/uL   Hemoglobin 10.4 (L) 12.0 - 15.0 g/dL   HCT 36.6 (L) 44.0 - 34.7 %   MCV  83.7 80.0 - 100.0 fL   MCH 25.0 (L) 26.0 - 34.0 pg   MCHC 29.9 (L) 30.0 - 36.0 g/dL   RDW 42.5 95.6 - 38.7 %   Platelet Count 227 150 - 400 K/uL   nRBC 0.0 0.0 - 0.2 %   Neutrophils Relative % 59 %   Neutro Abs 2.7 1.7 - 7.7 K/uL   Lymphocytes Relative 26 %   Lymphs Abs 1.2 0.7 - 4.0 K/uL   Monocytes Relative 12 %   Monocytes Absolute 0.5 0.1 - 1.0 K/uL   Eosinophils Relative 3 %   Eosinophils Absolute 0.1 0.0 - 0.5 K/uL   Basophils Relative 0 %   Basophils Absolute 0.0 0.0 - 0.1 K/uL   Immature Granulocytes 0 %   Abs Immature Granulocytes 0.01 0.00 - 0.07 K/uL      Assessment & Plan:   Problem List Items Addressed This Visit       Cardiovascular and Mediastinum   Hypertension associated with diabetes (HCC)   Relevant Orders   HgB A1c   Comprehensive metabolic panel   Mitral valve regurgitation    Chronic, ongoing.  Continue collaboration with cardiology, recent notes reviewed.        Endocrine   CKD stage 3 due to type 2 diabetes mellitus (HCC)    Chronic, ongoing. A1c stable at 6.3% last visit, continue diet focus.  Recheck A1c today. Continue collaboration with nephrology locally and at [facility], recent labs reviewed.  Urine ALB 21 October 2022, continue ARB.  Consider addition of Farxiga in future for kidney and heart health.  Return in 3 months.  - Up to date on eye and foot exam - Vaccinations up to date - ARB on board and Praluent -- can not take statin      Relevant Orders   HgB A1c   Diabetic neuropathy (HCC) - Primary    Chronic, ongoing. A1c stable at 6.3% last visit, will recheck today. She wishes to continue diet control and weight management at this time vs restart of medication. Gabapentin nightly for neuropathy. Check blood sugar daily at home. Labs: A1c and CMP today.  Return in 3 months.  Consider SGLT2 for kidney health.      Relevant Orders   HgB A1c   Hyperlipidemia associated with type 2 diabetes mellitus (HCC)   Relevant Orders   NMR, lipoprofile    Lipoprotein A (LPA)   CK (Creatine Kinase)   HgB A1c   Comprehensive metabolic panel     Musculoskeletal and Integument   Osteoarthritis    Ongoing issues with back and knees.  Recommend she ensure she move often, which helps her pain, plus continue Tylenol as needed.  Consider PT if ongoing or worsening.        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 Continue collaboration with hematology as needed. Labs up to date.      Relevant Orders   Comprehensive metabolic panel     Other   Elevated ferritin (Chronic)    Followed by hematology, will continue this collaboration.  Recent note reviewed.      History of rhabdomyolysis due to statin    Continue Praulent and adjust as needed. Unable to take statins, history of rhabdo and subsequent AKI.  Obtain labs today for lipid clinic.  She is tolerating Zetia, which was started by them.      Relevant Orders   NMR, lipoprofile   Lipoprotein A (LPA)   CK (Creatine Kinase)   Other Visit Diagnoses     Flu vaccine need       Flu vaccine in office today, educated patient.   Relevant Orders   Flu Vaccine Trivalent High Dose (Fluad) (Completed)       Follow up plan: Return in about 3 months (around 06/22/2023) for T2DM, HTN/HLD, CKD.

## 2023-03-23 NOTE — Assessment & Plan Note (Signed)
Chronic, stable. Continue collaboration with hematology as needed. Labs up to date.

## 2023-03-23 NOTE — Assessment & Plan Note (Signed)
Ongoing issues with back and knees.  Recommend she ensure she move often, which helps her pain, plus continue Tylenol as needed.  Consider PT if ongoing or worsening.

## 2023-03-23 NOTE — Assessment & Plan Note (Signed)
Continue Praulent and adjust as needed. Unable to take statins, history of rhabdo and subsequent AKI.  Obtain labs today for lipid clinic.  She is tolerating Zetia, which was started by them.

## 2023-03-23 NOTE — Assessment & Plan Note (Signed)
Chronic, ongoing. A1c stable at 6.3% last visit, will recheck today. She wishes to continue diet control and weight management at this time vs restart of medication. Gabapentin nightly for neuropathy. Check blood sugar daily at home. Labs: A1c and CMP today.  Return in 3 months.  Consider SGLT2 for kidney health.

## 2023-03-23 NOTE — Assessment & Plan Note (Signed)
Chronic, ongoing.  Continue collaboration with cardiology, recent notes reviewed.

## 2023-03-23 NOTE — Assessment & Plan Note (Signed)
Chronic, ongoing. A1c stable at 6.3% last visit, continue diet focus. Recheck A1c today. Continue collaboration with nephrology locally and at [facility], recent labs reviewed.  Urine ALB 21 October 2022, continue ARB.  Consider addition of Farxiga in future for kidney and heart health.  Return in 3 months.  - Up to date on eye and foot exam - Vaccinations up to date - ARB on board and Praluent -- can not take statin

## 2023-03-23 NOTE — Assessment & Plan Note (Signed)
Followed by hematology, will continue this collaboration.  Recent note reviewed.

## 2023-03-24 NOTE — Telephone Encounter (Signed)
Requested Prescriptions  Pending Prescriptions Disp Refills   gabapentin (NEURONTIN) 100 MG capsule [Pharmacy Med Name: Gabapentin 100 MG Oral Capsule] 200 capsule 0    Sig: TAKE 2 CAPSULES BY MOUTH AT  BEDTIME     Neurology: Anticonvulsants - gabapentin Failed - 03/23/2023 10:50 PM      Failed - Cr in normal range and within 360 days    Creatinine, Ser  Date Value Ref Range Status  03/23/2023 1.21 (H) 0.57 - 1.00 mg/dL Final   Creatinine, Urine  Date Value Ref Range Status  10/24/2020 28 mg/dL Final         Passed - Completed PHQ-2 or PHQ-9 in the last 360 days      Passed - Valid encounter within last 12 months    Recent Outpatient Visits           Yesterday Other diabetic neurological complication associated with type 2 diabetes mellitus (HCC)   [facility] [place], Corrie Dandy T, NP   2 months ago Hypertension associated with diabetes (HCC)   [place] [facility] [place], [place] T, NP   3 months ago CKD stage 3 due to type 2 diabetes mellitus (HCC)   [facility] [place], [place] T, NP   6 months ago CKD stage 3 due to type 2 diabetes mellitus (HCC)   [place] [facility] [place], Corrie Dandy T, NP   7 months ago Subacute cough   [place] [facility] [place], Dorie Rank, NP       Future Appointments             In 3 months [name], Dorie Rank, NP  [facility]

## 2023-03-24 NOTE — Progress Notes (Signed)
Contacted via MyChart   Good afternoon Tina Mcdonald, your labs are trickling back in: - A1c remains stable at 5.8%, down from July 6.3%.  Great job!! Continue diet focus. - Kidney function, creatinine and eGFR, show chronic kidney disease stage 3a to 3b levels with no worsening.  We will continue to monitor closely and ensure good hydration daily. - CK level is normal!!  [employer]!!  Waiting on remainder of labs.  Any questions? Keep being amazing!!  Thank you for allowing me to participate in your care.  I appreciate you. Kindest regards, Muneeb Veras

## 2023-03-25 LAB — COMPREHENSIVE METABOLIC PANEL
ALT: 13 [IU]/L (ref 0–32)
AST: 19 [IU]/L (ref 0–40)
Albumin: 4.3 g/dL (ref 3.8–4.8)
Alkaline Phosphatase: 74 [IU]/L (ref 44–121)
BUN/Creatinine Ratio: 25 (ref 12–28)
BUN: 30 mg/dL — ABNORMAL HIGH (ref 8–27)
Bilirubin Total: 0.2 mg/dL (ref 0.0–1.2)
CO2: 22 mmol/L (ref 20–29)
Calcium: 9.8 mg/dL (ref 8.7–10.3)
Chloride: 104 mmol/L (ref 96–106)
Creatinine, Ser: 1.21 mg/dL — ABNORMAL HIGH (ref 0.57–1.00)
Globulin, Total: 2.5 g/dL (ref 1.5–4.5)
Glucose: 106 mg/dL — ABNORMAL HIGH (ref 70–99)
Potassium: 4.5 mmol/L (ref 3.5–5.2)
Sodium: 141 mmol/L (ref 134–144)
Total Protein: 6.8 g/dL (ref 6.0–8.5)
eGFR: 45 mL/min/{1.73_m2} — ABNORMAL LOW (ref >59)

## 2023-03-25 LAB — NMR, LIPOPROFILE
Cholesterol, Total: 164 mg/dL (ref 100–199)
HDL Particle Number: 37.1 umol/L (ref >=30.5)
HDL-C: 63 mg/dL (ref >39)
LDL Particle Number: 1005 nmol/L — ABNORMAL HIGH (ref <1000)
LDL Size: 20.9 nmol (ref >20.5)
LDL-C (NIH Calc): 79 mg/dL (ref 0–99)
LP-IR Score: 27 (ref <=45)
Small LDL Particle Number: 395 nmol/L (ref <=527)
Triglycerides: 128 mg/dL (ref 0–149)

## 2023-03-25 LAB — LIPOPROTEIN A (LPA): Lipoprotein (a): 204.9 nmol/L — ABNORMAL HIGH (ref <75.0)

## 2023-03-25 LAB — HEMOGLOBIN A1C
Est. average glucose Bld gHb Est-mCnc: 120 mg/dL
Hgb A1c MFr Bld: 5.8 % — ABNORMAL HIGH (ref 4.8–5.6)

## 2023-03-25 LAB — CK: Total CK: 77 U/L (ref 32–182)

## 2023-03-25 NOTE — Progress Notes (Signed)
I obtained the labs you had wanted to perform on her.  They have returned for your review.:)

## 2023-03-26 ENCOUNTER — Inpatient Hospital Stay: Payer: Medicare Other | Attending: Oncology

## 2023-03-26 ENCOUNTER — Inpatient Hospital Stay: Payer: Medicare Other

## 2023-03-26 DIAGNOSIS — D631 Anemia in chronic kidney disease: Secondary | ICD-10-CM | POA: Insufficient documentation

## 2023-03-26 DIAGNOSIS — N1831 Chronic kidney disease, stage 3a: Secondary | ICD-10-CM

## 2023-03-26 DIAGNOSIS — N183 Chronic kidney disease, stage 3 unspecified: Secondary | ICD-10-CM | POA: Insufficient documentation

## 2023-03-26 DIAGNOSIS — E1122 Type 2 diabetes mellitus with diabetic chronic kidney disease: Secondary | ICD-10-CM | POA: Insufficient documentation

## 2023-03-26 LAB — HEMOGLOBIN AND HEMATOCRIT (CANCER CENTER ONLY)
HCT: 35.2 % — ABNORMAL LOW (ref 36.0–46.0)
Hemoglobin: 10.6 g/dL — ABNORMAL LOW (ref 12.0–15.0)

## 2023-04-01 ENCOUNTER — Encounter: Payer: Self-pay | Admitting: Internal Medicine

## 2023-04-23 ENCOUNTER — Inpatient Hospital Stay: Payer: Medicare Other

## 2023-04-23 DIAGNOSIS — N183 Chronic kidney disease, stage 3 unspecified: Secondary | ICD-10-CM | POA: Diagnosis not present

## 2023-04-23 DIAGNOSIS — D631 Anemia in chronic kidney disease: Secondary | ICD-10-CM

## 2023-04-23 DIAGNOSIS — E1122 Type 2 diabetes mellitus with diabetic chronic kidney disease: Secondary | ICD-10-CM | POA: Diagnosis not present

## 2023-04-23 LAB — HEMOGLOBIN AND HEMATOCRIT (CANCER CENTER ONLY)
HCT: 36.5 % (ref 36.0–46.0)
Hemoglobin: 10.9 g/dL — ABNORMAL LOW (ref 12.0–15.0)

## 2023-04-23 NOTE — Progress Notes (Signed)
HbG is 10.9. Hold Retacrit.

## 2023-04-28 DIAGNOSIS — Z96651 Presence of right artificial knee joint: Secondary | ICD-10-CM | POA: Diagnosis not present

## 2023-05-06 DIAGNOSIS — N183 Chronic kidney disease, stage 3 unspecified: Secondary | ICD-10-CM | POA: Diagnosis not present

## 2023-05-06 DIAGNOSIS — E1122 Type 2 diabetes mellitus with diabetic chronic kidney disease: Secondary | ICD-10-CM | POA: Diagnosis not present

## 2023-05-06 DIAGNOSIS — I10 Essential (primary) hypertension: Secondary | ICD-10-CM | POA: Diagnosis not present

## 2023-05-06 DIAGNOSIS — R5383 Other fatigue: Secondary | ICD-10-CM | POA: Diagnosis not present

## 2023-05-06 DIAGNOSIS — D508 Other iron deficiency anemias: Secondary | ICD-10-CM | POA: Diagnosis not present

## 2023-05-19 ENCOUNTER — Inpatient Hospital Stay: Payer: Medicare Other | Attending: Oncology

## 2023-05-19 ENCOUNTER — Inpatient Hospital Stay: Payer: Medicare Other

## 2023-05-19 DIAGNOSIS — N183 Chronic kidney disease, stage 3 unspecified: Secondary | ICD-10-CM | POA: Diagnosis not present

## 2023-05-19 DIAGNOSIS — D631 Anemia in chronic kidney disease: Secondary | ICD-10-CM | POA: Diagnosis not present

## 2023-05-19 DIAGNOSIS — E1122 Type 2 diabetes mellitus with diabetic chronic kidney disease: Secondary | ICD-10-CM | POA: Diagnosis not present

## 2023-05-19 LAB — HEMOGLOBIN AND HEMATOCRIT (CANCER CENTER ONLY)
HCT: 36.7 % (ref 36.0–46.0)
Hemoglobin: 11.2 g/dL — ABNORMAL LOW (ref 12.0–15.0)

## 2023-05-19 NOTE — Progress Notes (Signed)
Hbg is 11.2. No retacrit today.

## 2023-05-27 DIAGNOSIS — M25561 Pain in right knee: Secondary | ICD-10-CM | POA: Diagnosis not present

## 2023-05-27 DIAGNOSIS — R600 Localized edema: Secondary | ICD-10-CM | POA: Diagnosis not present

## 2023-06-02 DIAGNOSIS — R600 Localized edema: Secondary | ICD-10-CM | POA: Diagnosis not present

## 2023-06-02 DIAGNOSIS — M25561 Pain in right knee: Secondary | ICD-10-CM | POA: Diagnosis not present

## 2023-06-04 DIAGNOSIS — R600 Localized edema: Secondary | ICD-10-CM | POA: Diagnosis not present

## 2023-06-04 DIAGNOSIS — M25561 Pain in right knee: Secondary | ICD-10-CM | POA: Diagnosis not present

## 2023-06-08 DIAGNOSIS — H04123 Dry eye syndrome of bilateral lacrimal glands: Secondary | ICD-10-CM | POA: Diagnosis not present

## 2023-06-10 ENCOUNTER — Other Ambulatory Visit: Payer: Self-pay | Admitting: Nurse Practitioner

## 2023-06-11 DIAGNOSIS — M5416 Radiculopathy, lumbar region: Secondary | ICD-10-CM | POA: Diagnosis not present

## 2023-06-11 NOTE — Telephone Encounter (Signed)
Requested Prescriptions  Pending Prescriptions Disp Refills   gabapentin (NEURONTIN) 100 MG capsule [Pharmacy Med Name: Gabapentin 100 MG Oral Capsule] 200 capsule 2    Sig: TAKE 2 CAPSULES BY MOUTH AT  BEDTIME     Neurology: Anticonvulsants - gabapentin Failed - 06/11/2023  8:58 AM      Failed - Cr in normal range and within 360 days    Creatinine, Ser  Date Value Ref Range Status  03/23/2023 1.21 (H) 0.57 - 1.00 mg/dL Final   Creatinine, Urine  Date Value Ref Range Status  10/24/2020 28 mg/dL Final         Passed - Completed PHQ-2 or PHQ-9 in the last 360 days      Passed - Valid encounter within last 12 months    Recent Outpatient Visits           2 months ago Other diabetic neurological complication associated with type 2 diabetes mellitus (HCC)   [facility] [place], [place] T, NP   4 months ago Hypertension associated with diabetes (HCC)   [place] [facility] [place], [place] T, NP   5 months ago CKD stage 3 due to type 2 diabetes mellitus (HCC)   [place] [facility] [place], [place] T, NP   8 months ago CKD stage 3 due to type 2 diabetes mellitus (HCC)   [place] [facility] [place], Dorie Rank, NP   10 months ago Subacute cough   [place] [facility] [place], Dorie Rank, NP       Future Appointments             In 1 week [name], Dorie Rank, NP [place] [facility]

## 2023-06-20 NOTE — Patient Instructions (Signed)

## 2023-06-22 ENCOUNTER — Ambulatory Visit (INDEPENDENT_AMBULATORY_CARE_PROVIDER_SITE_OTHER): Payer: Medicare Other | Admitting: Nurse Practitioner

## 2023-06-22 VITALS — BP 127/68 | HR 76 | Temp 98.1°F | Ht 62.0 in | Wt 164.2 lb

## 2023-06-22 DIAGNOSIS — E1122 Type 2 diabetes mellitus with diabetic chronic kidney disease: Secondary | ICD-10-CM

## 2023-06-22 DIAGNOSIS — E1149 Type 2 diabetes mellitus with other diabetic neurological complication: Secondary | ICD-10-CM

## 2023-06-22 DIAGNOSIS — N183 Chronic kidney disease, stage 3 unspecified: Secondary | ICD-10-CM

## 2023-06-22 DIAGNOSIS — Z0000 Encounter for general adult medical examination without abnormal findings: Secondary | ICD-10-CM

## 2023-06-22 DIAGNOSIS — E785 Hyperlipidemia, unspecified: Secondary | ICD-10-CM

## 2023-06-22 DIAGNOSIS — E1169 Type 2 diabetes mellitus with other specified complication: Secondary | ICD-10-CM

## 2023-06-22 DIAGNOSIS — E1159 Type 2 diabetes mellitus with other circulatory complications: Secondary | ICD-10-CM | POA: Diagnosis not present

## 2023-06-22 DIAGNOSIS — D631 Anemia in chronic kidney disease: Secondary | ICD-10-CM | POA: Diagnosis not present

## 2023-06-22 DIAGNOSIS — I152 Hypertension secondary to endocrine disorders: Secondary | ICD-10-CM

## 2023-06-22 DIAGNOSIS — N1831 Chronic kidney disease, stage 3a: Secondary | ICD-10-CM

## 2023-06-22 DIAGNOSIS — R7989 Other specified abnormal findings of blood chemistry: Secondary | ICD-10-CM | POA: Diagnosis not present

## 2023-06-22 LAB — BAYER DCA HB A1C WAIVED: HB A1C (BAYER DCA - WAIVED): 6.1 % — ABNORMAL HIGH (ref 4.8–5.6)

## 2023-06-22 MED ORDER — ONETOUCH VERIO VI STRP: ORAL_STRIP | 12 refills | Status: AC

## 2023-06-22 MED ORDER — ONETOUCH ULTRASOFT LANCETS MISC: 12 refills | Status: AC

## 2023-06-22 MED ORDER — ONETOUCH VERIO W/DEVICE KIT: PACK | 0 refills | Status: AC

## 2023-06-22 NOTE — Assessment & Plan Note (Signed)
Chronic, ongoing. A1c stable at 6.1% today, remaining stable. She wishes to continue diet control and weight management at this time vs restart of medication. Gabapentin nightly for neuropathy. Check blood sugar daily at home. Labs: A1c.  Return in 3 months.  Consider SGLT2 for kidney health.

## 2023-06-22 NOTE — Assessment & Plan Note (Signed)
Chronic, stable. BP well below goal in office. Recommend BP checks at home a few times per week at home. DASH diet at home. Continue all current medications, reviewed with her.  She is to message provider if levels consistently >130/80 at home with changes. Labs: up to date.  Urine ALB 21 October 2022, continue Irbesartan.

## 2023-06-22 NOTE — Assessment & Plan Note (Addendum)
Chronic, ongoing. Unable to take statin due to rhabdo with associated AKI. Continue taking Praulent at this time.  Lipid panel today.

## 2023-06-22 NOTE — Progress Notes (Signed)
BP 127/68   Pulse 76   Temp 98.1 F (36.7 C) (Oral)   Ht 5\' 2"  (1.575 m)   Wt 164 lb 3.2 oz (74.5 kg)   LMP  (LMP Unknown)   SpO2 98%   BMI 30.03 kg/m    Subjective:    Patient ID: Tina Mcdonald, female    DOB: [birth year], 82 y.o.   MRN: [id number]  HPI: Tina Mcdonald is a 82 y.o. female  Chief Complaint  Patient presents with   Chronic Kidney Disease   Diabetes   Hyperlipidemia   Hypertension   DIABETES September A1c 6.1%.  No current medications for diabetes, diet focus.  Took Metformin in past.  Taking Gabapentin 300 MG at night for radiculopathy and leg discomfort at night.   Hypoglycemic episodes:no Polydipsia/polyuria: no Visual disturbance: no Chest pain: no Paresthesias: no Glucose Monitoring: yes             Accucheck frequency: not checking             Fasting glucose:              Post prandial:             Evening:             Before meals: Taking Insulin?: no             Long acting insulin:             Short acting insulin: Blood Pressure Monitoring: occasional Retinal Examination: Up To Date -- [employer] Foot Exam: Up to Date Pneumovax: Up to Date Influenza: Up to Date Aspirin: no   HYPERTENSION / HYPERLIPIDEMIA Taking Carvedilol, Chlorthalidone, Avapro, and Diltiazem.  Saw cardiology last 01/29/23. History of aortic atherosclerosis noted on past CT 01/26/17. MRI heart on 10/16/22 last with mild LVH, EF >65%, mild mitral valve regurgitation, idiopathic hypotrophic subaortic stenosis.  Statin reaction in past that led to admission to hospital and AKI. Currently taking Praluent for HLD every 14 days and Zetia.  Lpid clinic on 02/17/23 when they added Zetia. Satisfied with current treatment? yes Duration of hypertension: chronic BP monitoring frequency: twice a week BP range: 120-130/70 range BP medication side effects: no Duration of hyperlipidemia: chronic Cholesterol medication side effects: no Cholesterol supplements: none Medication  compliance: good compliance Aspirin: no Recent stressors: no Recurrent headaches: no Visual changes: no Palpitations: no Dyspnea: occasional Chest pain: no Lower extremity edema: occasional to right leg due to past surgery Dizzy/lightheaded: no  CHRONIC KIDNEY DISEASE Followed by nephrology [facility], last visit 05/01/23 - iron was 43. Had visits with hematology in past, last visit 01/29/23, no changes.   CKD status: stable Medications renally dose: yes Previous renal evaluation: yes Pneumovax:  Up to Date Influenza Vaccine:  Up to Date    Latest Ref Rng & Units 03/23/2023   10:15 AM 01/26/2023   11:42 AM 12/23/2022   10:50 AM  BMP  Glucose 70 - 99 mg/dL 696  295  95   BUN 8 - 27 mg/dL 30  27  28    Creatinine 0.57 - 1.00 mg/dL 2.84  1.32  4.40   BUN/Creat Ratio 12 - 28 25   21    Sodium 134 - 144 mmol/L 141  136  140   Potassium 3.5 - 5.2 mmol/L 4.5  4.0  4.4   Chloride 96 - 106 mmol/L 104  105  102   CO2 20 - 29 mmol/L 22  24  22  Calcium 8.7 - 10.3 mg/dL 9.8  9.2  47.8     Relevant past medical, surgical, family and social history reviewed and updated as indicated. Interim medical history since our last visit reviewed. Allergies and medications reviewed and updated.  Review of Systems  Constitutional:  Negative for activity change, appetite change, diaphoresis, fatigue and fever.  Respiratory:  Negative for cough, chest tightness, shortness of breath and wheezing.   Cardiovascular:  Negative for chest pain, palpitations and leg swelling.  Gastrointestinal: Negative.   Endocrine: Negative for cold intolerance, heat intolerance, polydipsia, polyphagia and polyuria.  Neurological: Negative.   Psychiatric/Behavioral: Negative.      Per HPI unless specifically indicated above     Objective:    BP 127/68   Pulse 76   Temp 98.1 F (36.7 C) (Oral)   Ht 5\' 2"  (1.575 m)   Wt 164 lb 3.2 oz (74.5 kg)   LMP  (LMP Unknown)   SpO2 98%   BMI 30.03 kg/m   Wt Readings from Last 3  Encounters:  06/22/23 164 lb 3.2 oz (74.5 kg)  03/23/23 161 lb 12.8 oz (73.4 kg)  02/26/23 161 lb 12.8 oz (73.4 kg)    Physical Exam Vitals and nursing note reviewed.  Constitutional:      General: She is awake. She is not in acute distress.    Appearance: She is well-developed and well-groomed. She is obese. She is not ill-appearing or toxic-appearing.  HENT:     Head: Normocephalic.     Right Ear: Hearing normal.     Left Ear: Hearing normal.  Eyes:     General: Lids are normal.        Right eye: No discharge.        Left eye: No discharge.     Conjunctiva/sclera: Conjunctivae normal.     Pupils: Pupils are equal, round, and reactive to light.  Neck:     Thyroid: No thyromegaly.     Vascular: No carotid bruit.  Cardiovascular:     Rate and Rhythm: Normal rate and regular rhythm.     Heart sounds: Murmur heard.     Systolic murmur is present with a grade of 2/6.     No gallop.  Pulmonary:     Effort: Pulmonary effort is normal. No accessory muscle usage or respiratory distress.     Breath sounds: Normal breath sounds.  Abdominal:     General: Bowel sounds are normal.     Palpations: Abdomen is soft. There is no hepatomegaly or splenomegaly.  Musculoskeletal:     Cervical back: Normal range of motion and neck supple.     Lumbar back: Normal.     Right knee: Normal.     Left knee: Normal.     Right lower leg: No edema.     Left lower leg: No edema.  Lymphadenopathy:     Cervical: No cervical adenopathy.  Skin:    General: Skin is warm and dry.  Neurological:     Mental Status: She is alert and oriented to person, place, and time.  Psychiatric:        Attention and Perception: Attention normal.        Mood and Affect: Mood normal.        Speech: Speech normal.        Behavior: Behavior normal. Behavior is cooperative.        Thought Content: Thought content normal.    Diabetic Foot Exam - Simple   Simple Foot  Form Visual Inspection No deformities, no  ulcerations, no other skin breakdown bilaterally: Yes Sensation Testing Intact to touch and monofilament testing bilaterally: Yes Pulse Check Posterior Tibialis and Dorsalis pulse intact bilaterally: Yes Comments     Results for orders placed or performed in visit on 05/19/23  Hemoglobin and Hematocrit (Cancer Center Only)   Collection Time: 05/19/23 11:02 AM  Result Value Ref Range   Hemoglobin 11.2 (L) 12.0 - 15.0 g/dL   HCT 44.0 10.2 - 72.5 %      Assessment & Plan:   Problem List Items Addressed This Visit       Cardiovascular and Mediastinum   Hypertension associated with diabetes (HCC)   Chronic, stable. BP well below goal in office. Recommend BP checks at home a few times per week at home. DASH diet at home. Continue all current medications, reviewed with her.  She is to message provider if levels consistently >130/80 at home with changes. Labs: up to date.  Urine ALB 21 October 2022, continue Irbesartan.       Relevant Orders   Bayer DCA Hb A1c Waived     Endocrine   CKD stage 3 due to type 2 diabetes mellitus (HCC)   Chronic, ongoing. A1c stable at 6.1% today, remaining stable. Continue collaboration with nephrology locally and at [facility], recent labs reviewed.  Urine ALB 21 October 2022, continue ARB.  Consider addition of Farxiga in future for kidney and heart health.  Return in 3 months.  - Up to date on eye and foot exam - Vaccinations up to date - ARB on board and Praluent -- can not take statin      Relevant Orders   Bayer DCA Hb A1c Waived   Diabetic neuropathy (HCC) - Primary   Chronic, ongoing. A1c stable at 6.1% today, remaining stable. She wishes to continue diet control and weight management at this time vs restart of medication. Gabapentin nightly for neuropathy. Check blood sugar daily at home. Labs: A1c.  Return in 3 months.  Consider SGLT2 for kidney health.      Relevant Orders   Bayer DCA Hb A1c Waived   Hyperlipidemia associated with type 2 diabetes  mellitus (HCC)   Chronic, ongoing. Unable to take statin due to rhabdo with associated AKI. Continue taking Praulent at this time.  Lipid panel today.      Relevant Orders   Bayer DCA Hb A1c Waived   Lipid Panel w/o Chol/HDL Ratio     Genitourinary   Anemia due to stage 3 chronic kidney disease (HCC) (Chronic)   Chronic, stable. Continue collaboration with hematology as needed. Labs up to date.        Other   Elevated ferritin (Chronic)   Followed by hematology, will continue this collaboration.  Recent note reviewed.       Follow up plan: Return in about 3 months (around 09/20/2023) for T2DM, HTN/HLD, CKD + needs Medicare Wellness with nurse.

## 2023-06-22 NOTE — Assessment & Plan Note (Signed)
Chronic, stable. Continue collaboration with hematology as needed. Labs up to date.

## 2023-06-22 NOTE — Assessment & Plan Note (Signed)
Chronic, ongoing. A1c stable at 6.1% today, remaining stable. Continue collaboration with nephrology locally and at [facility], recent labs reviewed.  Urine ALB 21 October 2022, continue ARB.  Consider addition of Farxiga in future for kidney and heart health.  Return in 3 months.  - Up to date on eye and foot exam - Vaccinations up to date - ARB on board and Praluent -- can not take statin

## 2023-06-22 NOTE — Assessment & Plan Note (Signed)
Followed by hematology, will continue this collaboration.  Recent note reviewed.

## 2023-06-23 ENCOUNTER — Telehealth: Payer: Self-pay | Admitting: Nurse Practitioner

## 2023-06-23 LAB — LIPID PANEL W/O CHOL/HDL RATIO
Cholesterol, Total: 157 mg/dL (ref 100–199)
HDL: 58 mg/dL (ref >39)
LDL Chol Calc (NIH): 78 mg/dL (ref 0–99)
Triglycerides: 120 mg/dL (ref 0–149)
VLDL Cholesterol Cal: 21 mg/dL (ref 5–40)

## 2023-06-23 MED ORDER — REPATHA SURECLICK 140 MG/ML ~~LOC~~ SOAJ: 140.0000 mg | SUBCUTANEOUS | 12 refills | Status: DC

## 2023-06-23 NOTE — Telephone Encounter (Signed)
 Copied from CRM [id number]. Topic: General - Other >> Jun 23, 2023 11:27 AM Yvone Marda Blow wrote: Silvena who is with [facility]... is calling to inform provider [name] that the medication (PRALUENT  150 MG/ML SOAJ)  starting June 24, 2023 will not be formulary, it will not be covered by [employer]. The medication Repatha  is a replacement. Silvena also stated that all the provider would need to do is send a new prescription for the Repatha  and the prior authorization will transfer.

## 2023-06-23 NOTE — Progress Notes (Signed)
Contacted via MyChart   Good morning Tina Mcdonald, your cholesterol labs remain stable.  Continue current medication regimen. Any questions? Keep being stellar!!  Thank you for allowing me to participate in your care.  I appreciate you. Kindest regards, Mainor Hellmann

## 2023-06-23 NOTE — Telephone Encounter (Signed)
Called and notified patient of medication change. Patient verbalized understanding.

## 2023-06-26 ENCOUNTER — Inpatient Hospital Stay: Payer: Medicare Other

## 2023-06-29 ENCOUNTER — Inpatient Hospital Stay: Payer: Medicare Other

## 2023-06-29 ENCOUNTER — Telehealth: Payer: Self-pay

## 2023-06-29 NOTE — Telephone Encounter (Signed)
 Pt missed lab appt this morning. Please check if she can come this afternoon for labs. If not, then ok to r/s appt to next week with labs piror to Md/ retarit

## 2023-06-30 ENCOUNTER — Inpatient Hospital Stay: Payer: Medicare Other

## 2023-06-30 ENCOUNTER — Inpatient Hospital Stay: Payer: Medicare Other | Admitting: Oncology

## 2023-06-30 ENCOUNTER — Inpatient Hospital Stay: Payer: Medicare Other | Attending: Oncology

## 2023-06-30 DIAGNOSIS — D631 Anemia in chronic kidney disease: Secondary | ICD-10-CM | POA: Insufficient documentation

## 2023-06-30 DIAGNOSIS — E1122 Type 2 diabetes mellitus with diabetic chronic kidney disease: Secondary | ICD-10-CM | POA: Insufficient documentation

## 2023-06-30 DIAGNOSIS — R7989 Other specified abnormal findings of blood chemistry: Secondary | ICD-10-CM | POA: Diagnosis not present

## 2023-06-30 DIAGNOSIS — N1831 Chronic kidney disease, stage 3a: Secondary | ICD-10-CM

## 2023-06-30 DIAGNOSIS — N183 Chronic kidney disease, stage 3 unspecified: Secondary | ICD-10-CM | POA: Insufficient documentation

## 2023-06-30 DIAGNOSIS — E538 Deficiency of other specified B group vitamins: Secondary | ICD-10-CM | POA: Diagnosis not present

## 2023-06-30 LAB — CBC WITH DIFFERENTIAL (CANCER CENTER ONLY)
Abs Immature Granulocytes: 0.02 10*3/uL (ref 0.00–0.07)
Basophils Absolute: 0 10*3/uL (ref 0.0–0.1)
Basophils Relative: 0 %
Eosinophils Absolute: 0.1 10*3/uL (ref 0.0–0.5)
Eosinophils Relative: 3 %
HCT: 34.3 % — ABNORMAL LOW (ref 36.0–46.0)
Hemoglobin: 10.5 g/dL — ABNORMAL LOW (ref 12.0–15.0)
Immature Granulocytes: 1 %
Lymphocytes Relative: 32 %
Lymphs Abs: 1.3 10*3/uL (ref 0.7–4.0)
MCH: 25.2 pg — ABNORMAL LOW (ref 26.0–34.0)
MCHC: 30.6 g/dL (ref 30.0–36.0)
MCV: 82.3 fL (ref 80.0–100.0)
Monocytes Absolute: 0.5 10*3/uL (ref 0.1–1.0)
Monocytes Relative: 13 %
Neutro Abs: 2.2 10*3/uL (ref 1.7–7.7)
Neutrophils Relative %: 51 %
Platelet Count: 214 10*3/uL (ref 150–400)
RBC: 4.17 MIL/uL (ref 3.87–5.11)
RDW: 14.3 % (ref 11.5–15.5)
WBC Count: 4.2 10*3/uL (ref 4.0–10.5)
nRBC: 0 % (ref 0.0–0.2)

## 2023-06-30 LAB — IRON AND TIBC
Iron: 42 ug/dL (ref 28–170)
Saturation Ratios: 17 % (ref 10.4–31.8)
TIBC: 255 ug/dL (ref 250–450)
UIBC: 213 ug/dL

## 2023-06-30 LAB — FERRITIN: Ferritin: 518 ng/mL — ABNORMAL HIGH (ref 11–307)

## 2023-06-30 LAB — RETIC PANEL
Immature Retic Fract: 9.7 % (ref 2.3–15.9)
RBC.: 4.16 MIL/uL (ref 3.87–5.11)
Retic Count, Absolute: 89 10*3/uL (ref 19.0–186.0)
Retic Ct Pct: 2.1 % (ref 0.4–3.1)
Reticulocyte Hemoglobin: 26.8 pg — ABNORMAL LOW (ref >27.9)

## 2023-07-03 ENCOUNTER — Telehealth: Payer: Self-pay | Admitting: Nurse Practitioner

## 2023-07-03 DIAGNOSIS — Z8739 Personal history of other diseases of the musculoskeletal system and connective tissue: Secondary | ICD-10-CM

## 2023-07-03 DIAGNOSIS — E1169 Type 2 diabetes mellitus with other specified complication: Secondary | ICD-10-CM

## 2023-07-03 NOTE — Telephone Encounter (Signed)
 Copied from CRM [id number]. Topic: General - Other >> Jul 02, 2023  4:59 PM Tina Mcdonald wrote: Reason for CRM: pt called saying her Repatha is going to cost 350.00 .  Please advise  CB#  [phone]

## 2023-07-06 ENCOUNTER — Telehealth: Payer: Self-pay

## 2023-07-06 NOTE — Progress Notes (Signed)
 Care Guide Pharmacy Note  07/06/2023 Name: Tina Mcdonald MRN: [id number] DOB: 08/10/1940  Referred By: Valerio Melanie DASEN, NP Reason for referral: Care Coordination (Outreach to schedule with Pharm d )   Tina Mcdonald is a 83 y.o. year old female who is a primary care patient of [name], Jolene T, NP.  Tina Mcdonald was referred to the pharmacist for assistance related to: DMII  Successful contact was made with the patient to discuss pharmacy services including being ready for the pharmacist to call at least 5 minutes before the scheduled appointment time and to have medication bottles and any blood pressure readings ready for review. The patient agreed to meet with the pharmacist via telephone visit on (date/time).07/13/2023  Jeoffrey Buffalo , RMA     [facility], [facility] Guide  Direct Dial: [phone]  Website: [email or web address]

## 2023-07-07 ENCOUNTER — Inpatient Hospital Stay: Payer: Medicare Other | Admitting: Oncology

## 2023-07-07 ENCOUNTER — Encounter: Payer: Self-pay | Admitting: Oncology

## 2023-07-07 ENCOUNTER — Inpatient Hospital Stay: Payer: Medicare Other

## 2023-07-07 VITALS — BP 120/57 | HR 70 | Temp 98.3°F | Resp 18 | Wt 164.7 lb

## 2023-07-07 DIAGNOSIS — D631 Anemia in chronic kidney disease: Secondary | ICD-10-CM

## 2023-07-07 DIAGNOSIS — E1122 Type 2 diabetes mellitus with diabetic chronic kidney disease: Secondary | ICD-10-CM | POA: Diagnosis not present

## 2023-07-07 DIAGNOSIS — E538 Deficiency of other specified B group vitamins: Secondary | ICD-10-CM | POA: Diagnosis not present

## 2023-07-07 DIAGNOSIS — R7989 Other specified abnormal findings of blood chemistry: Secondary | ICD-10-CM

## 2023-07-07 DIAGNOSIS — N1831 Chronic kidney disease, stage 3a: Secondary | ICD-10-CM | POA: Diagnosis not present

## 2023-07-07 DIAGNOSIS — N183 Chronic kidney disease, stage 3 unspecified: Secondary | ICD-10-CM | POA: Diagnosis not present

## 2023-07-07 NOTE — Assessment & Plan Note (Addendum)
 Labs are reviewed and discussed with patient. Lab Results  Component Value Date   HGB 10.5 (L) 06/30/2023   TIBC 255 06/30/2023   IRONPCTSAT 17 06/30/2023   FERRITIN 518 (H) 06/30/2023  Hemoglobin is >10 Hold off retacrit .  Continue Q6w H&H  and retacrit  if hb<=10

## 2023-07-07 NOTE — Assessment & Plan Note (Addendum)
 Off Slow Fe and vitamin C supplementation for now. Negative hemochromatosis gene mutation Ferritin has improved.

## 2023-07-07 NOTE — Assessment & Plan Note (Signed)
 Recommend empiric B12 daily. I will check B12 level at next visit.

## 2023-07-07 NOTE — Progress Notes (Signed)
 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 Lab Results  Component Value Date   HGB 10.5 (L) 06/30/2023   TIBC 255 06/30/2023   IRONPCTSAT 17 06/30/2023   FERRITIN 518 (H) 06/30/2023  Hemoglobin is >10 Hold off retacrit .  Continue Q6w H&H  and retacrit  if hb<=10   Elevated ferritin Off Slow Fe and vitamin C  supplementation for now. Negative hemochromatosis gene mutation Ferritin has improved.   B12 deficiency Recommend empiric B12 1000mcg daily. I will check B12 level at next visit.    Orders Placed This Encounter  Procedures   CBC with Differential (Cancer Center Only)    Standing Status:   Future    Expected Date:   11/10/2023    Expiration Date:   07/06/2024   Iron and TIBC    Standing Status:   Future    Expected Date:   11/10/2023    Expiration Date:   07/06/2024   Ferritin    Standing Status:   Future    Expected Date:   11/10/2023    Expiration Date:   07/06/2024   Vitamin B12    Standing Status:   Future    Expected Date:   11/10/2023    Expiration Date:   07/06/2024   Hemoglobin and Hematocrit (Cancer Center Only)    Standing Status:   Standing    Number of Occurrences:   2    Expiration Date:   07/06/2024   Follow up  Lab H&H in 6w,  12w +/- retacrit   18 weeks  lab prior MD +/- retacrit   All questions were answered. The patient knows to call the clinic with any problems, questions or concerns.  Call Babara, MD, PhD [facility] Hematology Oncology 07/07/2023    INTERVAL HISTORY Tina Mcdonald is a 83 y.o. female who has above history reviewed  by me today presents for follow up visit for management of anemia Patient reports feeling well at baseline. No new complaints.    Review of Systems  Constitutional:  Positive for fatigue. Negative for appetite change, chills and fever.  HENT:   Negative for hearing loss and voice change.   Eyes:  Negative for eye problems.  Respiratory:  Negative for chest tightness and cough.   Cardiovascular:  Negative for chest pain.  Gastrointestinal:  Negative for abdominal distention, abdominal pain and blood in stool.  Endocrine: Negative for hot flashes.  Genitourinary:  Negative for difficulty urinating and frequency.   Musculoskeletal:  Negative for arthralgias.  Skin:  Negative for itching and rash.  Neurological:  Negative for extremity weakness.  Hematological:  Negative for adenopathy.  Psychiatric/Behavioral:  Negative for confusion.      MEDICAL HISTORY:  Past Medical History:  Diagnosis Date   Anemia    Arthritis    Chronic kidney disease    Colon polyps    Diabetes mellitus without complication (HCC)    Diabetic neuropathy (HCC)    Dyspnea    GERD (gastroesophageal reflux disease)    Hyperlipemia    Hypertension    Osteoarthritis    Radiculopathy    weakness in right leg  Sleep apnea    does not have her cpap anymore   Stress incontinence    Urticaria    Vitamin B 12 deficiency    Wears glasses     SURGICAL HISTORY: Past Surgical History:  Procedure Laterality Date   ABDOMINAL HYSTERECTOMY     APPENDECTOMY     BREAST BIOPSY Right    right-neg   BREAST BIOPSY Left    us /bx/clip-neg   CARPAL TUNNEL RELEASE     right   COLONOSCOPY     COLONOSCOPY WITH PROPOFOL  N/A 12/10/2015   Procedure: COLONOSCOPY WITH PROPOFOL ;  Surgeon: Rogelia Copping, MD;  Location: [facility];  Service: Endoscopy;  Laterality: N/A;  Diabetic - oral meds Sleep Apnea LATEX allergy   COLONOSCOPY WITH PROPOFOL  N/A 01/17/2019   Procedure: COLONOSCOPY WITH PROPOFOL ;  Surgeon:  Janalyn Keene NOVAK, MD;  Location: [facility];  Service: Endoscopy;  Laterality: N/A;   CYSTOSCOPY     DILATION AND CURETTAGE OF UTERUS     ESOPHAGOGASTRODUODENOSCOPY (EGD) WITH PROPOFOL  N/A 01/17/2019   Procedure: ESOPHAGOGASTRODUODENOSCOPY (EGD) WITH PROPOFOL ;  Surgeon: Janalyn Keene NOVAK, MD;  Location: [facility];  Service: Endoscopy;  Laterality: N/A;   EYE SURGERY Bilateral    lens implant   GIVENS CAPSULE STUDY N/A 05/04/2019   Procedure: GIVENS CAPSULE STUDY;  Surgeon: Janalyn Keene NOVAK, MD;  Location: [facility];  Service: Endoscopy;  Laterality: N/A;   LUMBAR FUSION  2009   POLYPECTOMY  12/10/2015   Procedure: POLYPECTOMY;  Surgeon: Rogelia Copping, MD;  Location: [facility];  Service: Endoscopy;;   POLYPECTOMY  01/17/2019   Procedure: POLYPECTOMY;  Surgeon: Janalyn Keene NOVAK, MD;  Location: [facility];  Service: Endoscopy;;   RESECTION DISTAL CLAVICAL Right 11/25/2013   Procedure: RIGHT SHOULDER OPEN RESECTION DISTAL CLAVICAL EXCISION SOFT TISSUE TUMOR SHOULDER DEEP SUBFASCIAL INTRAMUSCULAR/DEBRIDEMENT/ROTATOR CUFF REPAIR/BICEPS TENODESIS/MANIPULATION;  Surgeon: Evalene JONETTA Chancy, MD;  Location: [facility];  Service: Orthopedics;  Laterality: Right;   SHOULDER ARTHROSCOPY WITH ROTATOR CUFF REPAIR AND SUBACROMIAL DECOMPRESSION Right 11/25/2013   Procedure: SHOULDER ARTHROSCOPY WITH DEBRIDEMENT ROTATOR CUFF REPAIR  A;  Surgeon: Evalene JONETTA Chancy, MD;  Location: [facility];  Service: Orthopedics;  Laterality: Right;    SOCIAL HISTORY: Social History   Socioeconomic History   Marital status: Single    Spouse name: Not on file   Number of children: Not on file   Years of education: Not on file   Highest education level: 12th grade  Occupational History   Occupation: retired  Tobacco Use   Smoking status: Former    Current packs/day: 0.00    Average packs/day: 1 pack/day for 15.0 years (15.0 ttl pk-yrs)     Types: Cigarettes    Start date: 11/23/1982    Quit date: 11/22/1997    Years since quitting: 25.6   Smokeless tobacco: Never  Vaping Use   Vaping status: Never Used  Substance and Sexual Activity   Alcohol use: No   Drug use: No   Sexual activity: Not Currently  Other Topics Concern   Not on file  Social History Narrative   Not on file   Social Drivers of Health   Financial Resource Strain: Low Risk  (06/22/2023)   Overall Financial Resource Strain (CARDIA)    Difficulty of Paying Living Expenses: Not very hard  Food Insecurity: No Food Insecurity (06/22/2023)   Hunger Vital Sign    Worried About Running Out of Food in the Last Year: Never true  Ran Out of Food in the Last Year: Never true  Transportation Needs: No Transportation Needs (06/22/2023)   PRAPARE - Administrator, Civil Service (Medical): No    Lack of Transportation (Non-Medical): No  Physical Activity: Unknown (06/22/2023)   Exercise Vital Sign    Days of Exercise per Week: 0 days    Minutes of Exercise per Session: Not on file  Stress: No Stress Concern Present (06/22/2023)   [employer] of Occupational Health - Occupational Stress Questionnaire    Feeling of Stress : Not at all  Social Connections: Moderately Integrated (06/22/2023)   Social Connection and Isolation Panel [NHANES]    Frequency of Communication with Friends and Family: More than three times a week    Frequency of Social Gatherings with Friends and Family: Once a week    Attends Religious Services: More than 4 times per year    Active Member of [employer] or Organizations: Yes    Attends Engineer, Structural: More than 4 times per year    Marital Status: Never married  Intimate Partner Violence: Not At Risk (03/31/2022)   Humiliation, Afraid, Rape, and Kick questionnaire    Fear of Current or Ex-Partner: No    Emotionally Abused: No    Physically Abused: No    Sexually Abused: No    FAMILY HISTORY: Family History   Problem Relation Age of Onset   Heart disease Mother    Diabetes Mother    Hypertension Mother    Heart disease Father    Hypertension Father    Diabetes Sister    Hypertension Sister    Heart disease Sister    Kidney disease Sister    Diabetes Sister    Hypertension Sister    Kidney disease Sister    Non-Hodgkin's lymphoma Maternal Uncle    Breast cancer Cousin 50    ALLERGIES:  is allergic to statins, contrast media [iodinated contrast media], penicillins, sulfa antibiotics, aloe, latex, neosporin [neomycin-bacitracin zn-polymyx], and other.  MEDICATIONS:  Current Outpatient Medications  Medication Sig Dispense Refill   acetaminophen  (TYLENOL ) 325 MG tablet Take 650 mg by mouth every 6 (six) hours as needed.     albuterol  (VENTOLIN  HFA) 108 (90 Base) MCG/ACT inhaler Inhale 2 puffs into the lungs every 6 (six) hours as needed for wheezing or shortness of breath. 18 g 1   Blood Glucose Monitoring Suppl (ONETOUCH VERIO) w/Device KIT To check blood sugar twice a day with goal fasting in morning <130 and goal 2 hours after meal <180.  Write all checks down for provider visits. 1 kit 0   carvedilol (COREG) 25 MG tablet Take 25 mg by mouth 2 (two) times daily.     chlorthalidone (HYGROTON) 25 MG tablet Take 25 mg by mouth daily.     Cholecalciferol (VITAMIN D -1000 MAX ST) 25 MCG (1000 UT) tablet Take by mouth.     diclofenac  sodium (VOLTAREN ) 1 % GEL Apply 2 g topically 2 (two) times daily as needed. 1 Tube 2   Evolocumab  (REPATHA  SURECLICK) 140 MG/ML SOAJ Inject 140 mg into the skin every 14 (fourteen) days. 2 mL 12   ezetimibe  (ZETIA ) 10 MG tablet Take 1 tablet (10 mg total) by mouth daily. 90 tablet 3   fluticasone  (FLONASE ) 50 MCG/ACT nasal spray Place into both nostrils.     gabapentin  (NEURONTIN ) 300 MG capsule Take 300 mg by mouth at bedtime.     glucose blood (ONETOUCH VERIO) test strip Use as instructed 100 each  12   irbesartan  (AVAPRO ) 150 MG tablet Take 150 mg by mouth  daily.     Lancets (ONETOUCH ULTRASOFT) lancets Use as instructed 100 each 12   omeprazole  (PRILOSEC) 20 MG capsule Take 1 capsule (20 mg total) by mouth 2 (two) times daily before a meal. 180 capsule 4   triamcinolone  cream (KENALOG ) 0.1 % APPLY  CREAM EXTERNALLY TWICE DAILY 30 g 0   diltiazem  (CARDIZEM  CD) 120 MG 24 hr capsule Take by mouth daily.     gabapentin  (NEURONTIN ) 100 MG capsule Take by mouth. (Patient not taking: Reported on 07/07/2023)     No current facility-administered medications for this visit.     PHYSICAL EXAMINATION: ECOG PERFORMANCE STATUS: 1 - Symptomatic but completely ambulatory Vitals:   07/07/23 1040  BP: (!) 120/57  Pulse: 70  Resp: 18  Temp: 98.3 F (36.8 C)  SpO2: 98%   Filed Weights   07/07/23 1040  Weight: 164 lb 11.2 oz (74.7 kg)    Physical Exam Constitutional:      General: She is not in acute distress. HENT:     Head: Normocephalic and atraumatic.  Eyes:     General: No scleral icterus.    Pupils: Pupils are equal, round, and reactive to light.  Cardiovascular:     Rate and Rhythm: Normal rate and regular rhythm.  Pulmonary:     Effort: Pulmonary effort is normal. No respiratory distress.     Breath sounds: No wheezing.  Abdominal:     General: Bowel sounds are normal. There is no distension.     Palpations: Abdomen is soft.  Musculoskeletal:        General: No deformity. Normal range of motion.     Cervical back: Normal range of motion and neck supple.  Skin:    General: Skin is warm and dry.     Findings: No erythema or rash.  Neurological:     Mental Status: She is alert and oriented to person, place, and time.     Cranial Nerves: No cranial nerve deficit.     Coordination: Coordination normal.  Psychiatric:        Mood and Affect: Mood normal.      LABORATORY DATA:  I have reviewed the data as listed    Latest Ref Rng & Units 06/30/2023    8:56 AM 05/19/2023   11:02 AM 04/23/2023   10:50 AM  CBC  WBC 4.0 - 10.5  K/uL 4.2     Hemoglobin 12.0 - 15.0 g/dL 89.4  88.7  89.0   Hematocrit 36.0 - 46.0 % 34.3  36.7  36.5   Platelets 150 - 400 K/uL 214          Latest Ref Rng & Units 03/23/2023   10:15 AM 01/26/2023   11:42 AM 12/23/2022   10:50 AM  CMP  Glucose 70 - 99 mg/dL 893  893  95   BUN 8 - 27 mg/dL 30  27  28    Creatinine 0.57 - 1.00 mg/dL 8.78  8.79  8.65   Sodium 134 - 144 mmol/L 141  136  140   Potassium 3.5 - 5.2 mmol/L 4.5  4.0  4.4   Chloride 96 - 106 mmol/L 104  105  102   CO2 20 - 29 mmol/L 22  24  22    Calcium  8.7 - 10.3 mg/dL 9.8  9.2  89.9   Total Protein 6.0 - 8.5 g/dL 6.8  7.1    Total Bilirubin 0.0 -  1.2 mg/dL 0.2  0.5    Alkaline Phos 44 - 121 IU/L 74  47    AST 0 - 40 IU/L 19  16    ALT 0 - 32 IU/L 13  11      Iron/TIBC/Ferritin/ %Sat    Component Value Date/Time   IRON 42 06/30/2023 0856   IRON 48 10/08/2022 0955   TIBC 255 06/30/2023 0856   TIBC 200 (L) 10/08/2022 0955   FERRITIN 518 (H) 06/30/2023 0856   FERRITIN 1,004 (H) 10/08/2022 0955   IRONPCTSAT 17 06/30/2023 0856   IRONPCTSAT 24 10/08/2022 0955

## 2023-07-09 ENCOUNTER — Ambulatory Visit: Payer: Medicare Other | Admitting: Emergency Medicine

## 2023-07-09 VITALS — Ht 62.0 in | Wt 164.0 lb

## 2023-07-09 DIAGNOSIS — Z0000 Encounter for general adult medical examination without abnormal findings: Secondary | ICD-10-CM

## 2023-07-09 DIAGNOSIS — Z1231 Encounter for screening mammogram for malignant neoplasm of breast: Secondary | ICD-10-CM

## 2023-07-09 NOTE — Patient Instructions (Addendum)
[name] , Thank you for taking time to come for your Medicare Wellness Visit. I appreciate your ongoing commitment to your health goals. Please review the following plan we discussed and let me know if I can assist you in the future.   Referrals/Orders/Follow-Ups/Clinician Recommendations: I have placed an order for a mammogram. It is due after 10/27/23. Call [facility] @ [phone] to schedule at your convenience. You will also be due for a diabetic eye exam after 10/13/23. Schedule this at your convenience.  This is a list of the screening recommended for you and due dates:  Health Maintenance  Topic Date Due   COVID-19 Vaccine (5 - 2024-25 season) 02/22/2023   Yearly kidney health urinalysis for diabetes  09/23/2023   Eye exam for diabetics  10/13/2023   Mammogram  11/06/2023   Hemoglobin A1C  12/21/2023   Yearly kidney function blood test for diabetes  03/22/2024   Complete foot exam   06/21/2024   Medicare Annual Wellness Visit  07/08/2024   DEXA scan (bone density measurement)  02/19/2026   DTaP/Tdap/Td vaccine (3 - Tdap) 08/25/2030   Pneumonia Vaccine  Completed   Flu Shot  Completed   Zoster (Shingles) Vaccine  Completed   HPV Vaccine  Aged Out   Hepatitis C Screening  Discontinued    Advanced directives: (Copy Requested) Please bring a copy of your health care power of attorney and living will to the office to be added to your chart at your convenience.  Next Medicare Annual Wellness Visit scheduled for next year: Yes, 07/21/24 @ 9:20am (video visit)

## 2023-07-09 NOTE — Progress Notes (Signed)
Subjective:   Tina Mcdonald is a 83 y.o. female who presents for Medicare Annual (Subsequent) preventive examination.  This patient declined Interactive audio and Acupuncturist. Therefore the visit was completed with audio only.   Visit Complete: Virtual I connected with  [name] on 07/09/23 by a audio enabled telemedicine application and verified that I am speaking with the correct person using two identifiers.  Patient Location: Home  Provider Location: Home Office  I discussed the limitations of evaluation and management by telemedicine. The patient expressed understanding and agreed to proceed.  Vital Signs: Because this visit was a virtual/telehealth visit, some criteria may be missing or patient reported. Any vitals not documented were not able to be obtained and vitals that have been documented are patient reported.   Cardiac Risk Factors include: advanced age (>17men, >63 women);diabetes mellitus;dyslipidemia;hypertension;obesity (BMI >30kg/m2)     Objective:    Today's Vitals   07/09/23 0920  Weight: 164 lb (74.4 kg)  Height: 5\' 2"  (1.575 m)   Body mass index is 30 kg/m.     07/09/2023    9:37 AM 01/29/2023    1:28 PM 03/31/2022   12:05 PM 01/27/2022    1:23 PM 01/25/2022   12:04 PM 07/29/2021    1:06 PM 03/29/2021    2:39 PM  Advanced Directives  Does Patient Have a Medical Advance Directive? Yes Yes Yes Yes No No Yes  Type of Advance Directive Healthcare Power of Attorney Living will;Healthcare Power of [employer] Power of [employer] Power of [place];Living will   Healthcare Power of [place];Living will  Does patient want to make changes to medical advance directive? No - Patient declined   Yes (ED - Information included in AVS)     Copy of Healthcare Power of Attorney in Chart? No - copy requested No - copy requested No - copy requested    No - copy requested  Would patient like information on creating a medical advance  directive?      No - Patient declined     Current Medications (verified) Outpatient Encounter Medications as of 07/09/2023  Medication Sig   acetaminophen (TYLENOL) 325 MG tablet Take 650 mg by mouth every 6 (six) hours as needed.   albuterol (VENTOLIN HFA) 108 (90 Base) MCG/ACT inhaler Inhale 2 puffs into the lungs every 6 (six) hours as needed for wheezing or shortness of breath.   Blood Glucose Monitoring Suppl (ONETOUCH VERIO) w/Device KIT To check blood sugar twice a day with goal fasting in morning <130 and goal 2 hours after meal <180.  Write all checks down for provider visits.   carvedilol (COREG) 25 MG tablet Take 25 mg by mouth 2 (two) times daily.   chlorthalidone (HYGROTON) 25 MG tablet Take 25 mg by mouth daily.   Cholecalciferol (VITAMIN D-1000 MAX ST) 25 MCG (1000 UT) tablet Take by mouth.   cyanocobalamin (VITAMIN B12) 1000 MCG tablet Take 1,000 mcg by mouth daily.   diclofenac sodium (VOLTAREN) 1 % GEL Apply 2 g topically 2 (two) times daily as needed.   diltiazem (CARDIZEM CD) 120 MG 24 hr capsule Take by mouth daily.   ezetimibe (ZETIA) 10 MG tablet Take 1 tablet (10 mg total) by mouth daily.   fluticasone (FLONASE) 50 MCG/ACT nasal spray Place into both nostrils.   gabapentin (NEURONTIN) 100 MG capsule Take by mouth.   glucose blood (ONETOUCH VERIO) test strip Use as instructed   irbesartan (AVAPRO) 150 MG tablet Take 150 mg  by mouth daily.   Lancets (ONETOUCH ULTRASOFT) lancets Use as instructed   omeprazole (PRILOSEC) 20 MG capsule Take 1 capsule (20 mg total) by mouth 2 (two) times daily before a meal.   triamcinolone cream (KENALOG) 0.1 % APPLY  CREAM EXTERNALLY TWICE DAILY   Evolocumab (REPATHA SURECLICK) 140 MG/ML SOAJ Inject 140 mg into the skin every 14 (fourteen) days. (Patient not taking: Reported on 07/09/2023)   gabapentin (NEURONTIN) 300 MG capsule Take 300 mg by mouth at bedtime. (Patient not taking: Reported on 07/09/2023)   [DISCONTINUED] PRALUENT 150 MG/ML  SOAJ INJECT 150MG  SUBCUTANEOUSLY  EVERY 2 WEEKS   No facility-administered encounter medications on file as of 07/09/2023.    Allergies (verified) Statins, Contrast media [iodinated contrast media], Penicillins, Sulfa antibiotics, Aloe, Latex, Neosporin [neomycin-bacitracin zn-polymyx], and Other   History: Past Medical History:  Diagnosis Date   Anemia    Arthritis    Chronic kidney disease    Colon polyps    Diabetes mellitus without complication (HCC)    Diabetic neuropathy (HCC)    Dyspnea    GERD (gastroesophageal reflux disease)    Hyperlipemia    Hypertension    Osteoarthritis    Radiculopathy    weakness in right leg   Sleep apnea    does not have her cpap anymore   Stress incontinence    Urticaria    Vitamin B 12 deficiency    Wears glasses    Past Surgical History:  Procedure Laterality Date   ABDOMINAL HYSTERECTOMY     APPENDECTOMY     BREAST BIOPSY Right    right-neg   BREAST BIOPSY Left    us/bx/clip-neg   CARPAL TUNNEL RELEASE     right   COLONOSCOPY     COLONOSCOPY WITH PROPOFOL N/A 12/10/2015   Procedure: COLONOSCOPY WITH PROPOFOL;  Surgeon: Midge Minium, MD;  Location: [facility];  Service: Endoscopy;  Laterality: N/A;  Diabetic - oral meds Sleep Apnea LATEX allergy   COLONOSCOPY WITH PROPOFOL N/A 01/17/2019   Procedure: COLONOSCOPY WITH PROPOFOL;  Surgeon: Pasty Spillers, MD;  Location: [facility];  Service: Endoscopy;  Laterality: N/A;   CYSTOSCOPY     DILATION AND CURETTAGE OF UTERUS     ESOPHAGOGASTRODUODENOSCOPY (EGD) WITH PROPOFOL N/A 01/17/2019   Procedure: ESOPHAGOGASTRODUODENOSCOPY (EGD) WITH PROPOFOL;  Surgeon: Pasty Spillers, MD;  Location: [facility];  Service: Endoscopy;  Laterality: N/A;   EYE SURGERY Bilateral    lens implant   GIVENS CAPSULE STUDY N/A 05/04/2019   Procedure: GIVENS CAPSULE STUDY;  Surgeon: Pasty Spillers, MD;  Location: [facility];  Service: Endoscopy;  Laterality: N/A;    LUMBAR FUSION  2009   POLYPECTOMY  12/10/2015   Procedure: POLYPECTOMY;  Surgeon: Midge Minium, MD;  Location: [facility];  Service: Endoscopy;;   POLYPECTOMY  01/17/2019   Procedure: POLYPECTOMY;  Surgeon: Pasty Spillers, MD;  Location: [facility];  Service: Endoscopy;;   RESECTION DISTAL CLAVICAL Right 11/25/2013   Procedure: RIGHT SHOULDER OPEN RESECTION DISTAL CLAVICAL EXCISION SOFT TISSUE TUMOR SHOULDER DEEP SUBFASCIAL INTRAMUSCULAR/DEBRIDEMENT/ROTATOR CUFF REPAIR/BICEPS TENODESIS/MANIPULATION;  Surgeon: Sheral Apley, MD;  Location: [facility];  Service: Orthopedics;  Laterality: Right;   SHOULDER ARTHROSCOPY WITH ROTATOR CUFF REPAIR AND SUBACROMIAL DECOMPRESSION Right 11/25/2013   Procedure: SHOULDER ARTHROSCOPY WITH DEBRIDEMENT ROTATOR CUFF REPAIR  A;  Surgeon: Sheral Apley, MD;  Location: [facility];  Service: Orthopedics;  Laterality: Right;   Family History  Problem Relation  Age of Onset   Heart disease Mother    Diabetes Mother    Hypertension Mother    Heart disease Father    Hypertension Father    Diabetes Sister    Hypertension Sister    Heart disease Sister    Kidney disease Sister    Diabetes Sister    Hypertension Sister    Kidney disease Sister    Non-Hodgkin's lymphoma Maternal Uncle    Breast cancer Cousin 42   Social History   Socioeconomic History   Marital status: Single    Spouse name: Not on file   Number of children: 1   Years of education: Not on file   Highest education level: 12th grade  Occupational History   Occupation: retired  Tobacco Use   Smoking status: Former    Current packs/day: 0.00    Average packs/day: 1 pack/day for 15.0 years (15.0 ttl pk-yrs)    Types: Cigarettes    Start date: 11/23/1982    Quit date: 11/22/1997    Years since quitting: 25.6   Smokeless tobacco: Never  Vaping Use   Vaping status: Never Used  Substance and Sexual Activity   Alcohol use: No   Drug use:  No   Sexual activity: Not Currently  Other Topics Concern   Not on file  Social History Narrative   Not on file   Social Drivers of Health   Financial Resource Strain: Low Risk  (07/09/2023)   Overall Financial Resource Strain (CARDIA)    Difficulty of Paying Living Expenses: Not hard at all  Food Insecurity: No Food Insecurity (07/09/2023)   Hunger Vital Sign    Worried About Running Out of Food in the Last Year: Never true    Ran Out of Food in the Last Year: Never true  Transportation Needs: No Transportation Needs (07/09/2023)   PRAPARE - Administrator, Civil Service (Medical): No    Lack of Transportation (Non-Medical): No  Physical Activity: Inactive (07/09/2023)   Exercise Vital Sign    Days of Exercise per Week: 0 days    Minutes of Exercise per Session: 0 min  Stress: No Stress Concern Present (07/09/2023)   [employer] of Occupational Health - Occupational Stress Questionnaire    Feeling of Stress : Not at all  Social Connections: Moderately Integrated (07/09/2023)   Social Connection and Isolation Panel [NHANES]    Frequency of Communication with Friends and Family: More than three times a week    Frequency of Social Gatherings with Friends and Family: More than three times a week    Attends Religious Services: More than 4 times per year    Active Member of [employer] or Organizations: Yes    Attends Engineer, structural: More than 4 times per year    Marital Status: Never married    Tobacco Counseling Counseling given: Not Answered   Clinical Intake:  Pre-visit preparation completed: Yes  Pain : No/denies pain     BMI - recorded: 30 Nutritional Status: BMI > 30  Obese Nutritional Risks: None Diabetes: Yes CBG done?: No Did pt. bring in CBG monitor from home?: No  How often do you need to have someone help you when you read instructions, pamphlets, or other written materials from your doctor or pharmacy?: 1 - Never  Interpreter  Needed?: No  Information entered by :: Tora Kindred, CMA   Activities of Daily Living    07/09/2023    9:24 AM 07/05/2023  1:28 PM  In your present state of health, do you have any difficulty performing the following activities:  Hearing? 0 0  Vision? 0 0  Difficulty concentrating or making decisions? 0 0  Walking or climbing stairs? 1 1  Comment 3-4 steps/stairs ok, but has trouble with a larger flight of stairs   Dressing or bathing? 0 0  Doing errands, shopping? 0 0  Preparing Food and eating ? N N  Using the Toilet? N N  In the past six months, have you accidently leaked urine? Malvin Johns  Comment wears pad   Do you have problems with loss of bowel control? N N  Managing your Medications? N N  Managing your Finances? N N  Housekeeping or managing your Housekeeping? N N    Patient Care Team: Marjie Skiff, NP as PCP - General (Nurse Practitioner) Alwyn Pea, MD (Internal Medicine) Rickard Patience, MD as Consulting Physician (Oncology)  Indicate any recent Medical Services you may have received from other than [facility] providers in the past year (date may be approximate).     Assessment:   This is a routine wellness examination for [place]yer.  Hearing/Vision screen Hearing Screening - Comments:: No hearing loss Vision Screening - Comments:: Gets eye exam   Goals Addressed             This Visit's Progress    Patient Stated       Exercise when it gets warmer so she can walk      Depression Screen    07/09/2023    9:34 AM 06/22/2023   10:30 AM 03/23/2023   10:07 AM 01/20/2023   10:48 AM 12/23/2022   10:48 AM 09/23/2022    8:21 AM 07/23/2022    1:27 PM  PHQ 2/9 Scores  PHQ - 2 Score 0 0 1 0 0 0 0  PHQ- 9 Score 0 6 6 2  0 2 2    Fall Risk    07/09/2023    9:37 AM 07/05/2023    1:28 PM 06/22/2023   10:29 AM 03/23/2023   10:07 AM 01/20/2023   10:48 AM  Fall Risk   Falls in the past year? 1 1 0 0 0  Number falls in past yr: 0 0 0 0 0  Injury with Fall? 0 0 0 0 0   Risk for fall due to : No Fall Risks  No Fall Risks No Fall Risks No Fall Risks  Follow up Falls prevention discussed  Falls evaluation completed Falls evaluation completed Falls evaluation completed    MEDICARE RISK AT HOME: Medicare Risk at Home Any stairs in or around the home?: Yes If so, are there any without handrails?: No Home free of loose throw rugs in walkways, pet beds, electrical cords, etc?: Yes Adequate lighting in your home to reduce risk of falls?: Yes Life alert?: No Use of a cane, walker or w/c?: No Grab bars in the bathroom?: No Shower chair or bench in shower?: No Elevated toilet seat or a handicapped toilet?: Yes  TIMED UP AND GO:  Was the test performed?  No    Cognitive Function:        07/09/2023    9:39 AM 03/31/2022   12:09 PM 03/29/2021    2:46 PM 03/26/2020    2:35 PM 03/04/2018    2:47 PM  6CIT Screen  What Year? 0 points 0 points 0 points 0 points 0 points  What month? 0 points 0 points 0 points 0  points 0 points  What time? 0 points 0 points 0 points 0 points 0 points  Count back from 20 0 points 0 points 0 points 0 points 0 points  Months in reverse 0 points 2 points 0 points 0 points 0 points  Repeat phrase 0 points 0 points 2 points 0 points 0 points  Total Score 0 points 2 points 2 points 0 points 0 points    Immunizations Immunization History  Administered Date(s) Administered   Fluad Trivalent(High Dose 65+) 03/23/2023   Influenza,inj,Quad PF,6+ Mos 04/02/2021, 04/22/2022   PFIZER(Purple Top)SARS-COV-2 Vaccination 08/01/2019, 08/22/2019, 04/03/2020   Pfizer Covid-19 Vaccine Bivalent Booster 41yrs & up 04/02/2021   Pneumococcal Conjugate-13 10/12/2013   Pneumococcal Polysaccharide-23 05/28/2010, 11/26/2021   Td 10/02/2006, 08/24/2020   Zoster Recombinant(Shingrix) 11/26/2021, 04/22/2022    TDAP status: Up to date  Flu Vaccine status: Up to date  Pneumococcal vaccine status: Up to date  Covid-19 vaccine status: Information  provided on how to obtain vaccines.   Qualifies for Shingles Vaccine? Yes   Zostavax completed No   Shingrix Completed?: Yes  Screening Tests Health Maintenance  Topic Date Due   COVID-19 Vaccine (5 - 2024-25 season) 02/22/2023   Diabetic kidney evaluation - Urine ACR  09/23/2023   OPHTHALMOLOGY EXAM  10/13/2023   MAMMOGRAM  11/06/2023   HEMOGLOBIN A1C  12/21/2023   Diabetic kidney evaluation - eGFR measurement  03/22/2024   FOOT EXAM  06/21/2024   Medicare Annual Wellness (AWV)  07/08/2024   DEXA SCAN  02/19/2026   DTaP/Tdap/Td (3 - Tdap) 08/25/2030   Pneumonia Vaccine 81+ Years old  Completed   INFLUENZA VACCINE  Completed   Zoster Vaccines- Shingrix  Completed   HPV VACCINES  Aged Out   Hepatitis C Screening  Discontinued    Health Maintenance  Health Maintenance Due  Topic Date Due   COVID-19 Vaccine (5 - 2024-25 season) 02/22/2023    Colorectal cancer screening: No longer required.   Mammogram status: Completed 10/27/22. Repeat every year  Bone Density status: Completed 02/20/16. Results reflect: Bone density results: NORMAL. Repeat every 10 years.  Lung Cancer Screening: (Low Dose CT Chest recommended if Age 38-80 years, 20 pack-year currently smoking OR have quit w/in 15years.) does not qualify.   Lung Cancer Screening Referral: n/a  Additional Screening:  Hepatitis C Screening: does not qualify; Completed 12/02/19  Vision Screening: Recommended annual ophthalmology exams for early detection of glaucoma and other disorders of the eye. Is the patient up to date with their annual eye exam?  Yes  Who is the provider or what is the name of the office in which the patient attends annual eye exams? [facility], [place] If pt is not established with a provider, would they like to be referred to a provider to establish care? No .   Dental Screening: Recommended annual dental exams for proper oral hygiene  Diabetic Foot Exam: Diabetic Foot Exam: Completed  06/22/23  Community Resource Referral / Chronic Care Management: CRR required this visit?  No   CCM required this visit?  No     Plan:     I have personally reviewed and noted the following in the patient's chart:   Medical and social history Use of alcohol, tobacco or illicit drugs  Current medications and supplements including opioid prescriptions. Patient is not currently taking opioid prescriptions. Functional ability and status Nutritional status Physical activity Advanced directives List of other physicians Hospitalizations, surgeries, and ER visits in previous 12 months Vitals Screenings  to include cognitive, depression, and falls Referrals and appointments  In addition, I have reviewed and discussed with patient certain preventive protocols, quality metrics, and best practice recommendations. A written personalized care plan for preventive services as well as general preventive health recommendations were provided to patient.     Tora Kindred, CMA   07/09/2023   After Visit Summary: (MyChart) Due to this being a telephonic visit, the after visit summary with patients personalized plan was offered to patient via MyChart   Nurse Notes:  Placed order for MMG due after 10/27/23. Diabetic eye exam due after 10/13/23 Declined DM & Nutrition education

## 2023-07-10 DIAGNOSIS — M25561 Pain in right knee: Secondary | ICD-10-CM | POA: Diagnosis not present

## 2023-07-10 DIAGNOSIS — R600 Localized edema: Secondary | ICD-10-CM | POA: Diagnosis not present

## 2023-07-13 ENCOUNTER — Telehealth: Payer: Self-pay | Admitting: Nurse Practitioner

## 2023-07-13 ENCOUNTER — Other Ambulatory Visit: Payer: Self-pay

## 2023-07-13 NOTE — Progress Notes (Signed)
   07/13/2023  Patient ID: Tina Mcdonald, female   DOB: 17-Mar-1941, 83 y.o.   MRN: [id number]  Patient out reach attempt for scheduled telephone visit to assist with affordability of Repatha was unsuccessful.  Tried to call patient twice but did not get an answer either time.  I was able to leave HIPAA compliant voicemail with my direct phone number for patient to return my call.  If I do not hear back from her in the next couple of days I will attempt to reach out again.  Lenna Gilford, PharmD, DPLA

## 2023-07-13 NOTE — Telephone Encounter (Signed)
Copied from CRM [id number]. Topic: General - Inquiry >> Jul 13, 2023 12:23 PM Patsy Lager T wrote: Reason for CRM: patient returned a call, she is aware she missed her appt with Sabino Niemann this morning and would like to reschedule

## 2023-07-15 ENCOUNTER — Other Ambulatory Visit: Payer: Medicare Other

## 2023-07-15 ENCOUNTER — Other Ambulatory Visit: Payer: Self-pay

## 2023-07-15 NOTE — Progress Notes (Addendum)
   07/15/2023  Patient ID: Tina Mcdonald, female   DOB: September 07, 1940, 83 y.o.   MRN: [id number]  Patient returning my call, so I made her aware that she has been approved for the health well foundation grant to assist with the co-pay of her Repatha.  Patient plans to pick up and initiate Repatha therapy.  I informed her to reach out if she has any future questions or concerns about the medication.  Lenna Gilford, PharmD, DPLA

## 2023-07-15 NOTE — Progress Notes (Signed)
   07/15/2023  Patient ID: Tina Mcdonald, female   DOB: 1940-06-24, 83 y.o.   MRN: [id number]  Subjective/Objective Referral placed by patient's primary care provider to assist with affordability of Repatha  Medication assistance -Hypercholesteremia previously treated with Praluent, but patient's insurance no longer will cover that medications; Repatha is now be preferred therapy.  Co-pay for Repatha was going to be over $300, which patient cannot afford -Patient's most recent LDL value is 78  Assessment/Plan  Medication assistance -Patient qualifies for health well foundation grant to assist with co-pay of Repatha.  I have enrolled the patient, and provided them information to her Tina Mcdonald pharmacy.  Repatha is now going through for $0 co-pay for her.  Kennedy Bucker information can be found under the patient's active FYI's tab.   Follow-up: Telephone visit scheduled with patient for this morning, but I was not able to reach her.  I left a HIPAA compliant voicemail with my direct phone number so she can call me back, and I will try to call her again if I do not hear back before the end of the week.  Lenna Gilford, PharmD, DPLA

## 2023-08-18 ENCOUNTER — Ambulatory Visit: Payer: Medicare Other

## 2023-08-18 ENCOUNTER — Other Ambulatory Visit: Payer: Medicare Other

## 2023-08-18 ENCOUNTER — Inpatient Hospital Stay: Payer: Medicare Other | Attending: Oncology

## 2023-08-18 ENCOUNTER — Inpatient Hospital Stay: Payer: Medicare Other

## 2023-08-18 DIAGNOSIS — E1122 Type 2 diabetes mellitus with diabetic chronic kidney disease: Secondary | ICD-10-CM | POA: Diagnosis not present

## 2023-08-18 DIAGNOSIS — N183 Chronic kidney disease, stage 3 unspecified: Secondary | ICD-10-CM | POA: Diagnosis not present

## 2023-08-18 DIAGNOSIS — N1831 Chronic kidney disease, stage 3a: Secondary | ICD-10-CM

## 2023-08-18 DIAGNOSIS — D631 Anemia in chronic kidney disease: Secondary | ICD-10-CM | POA: Diagnosis not present

## 2023-08-18 LAB — HEMOGLOBIN AND HEMATOCRIT (CANCER CENTER ONLY)
HCT: 35.8 % — ABNORMAL LOW (ref 36.0–46.0)
Hemoglobin: 11 g/dL — ABNORMAL LOW (ref 12.0–15.0)

## 2023-08-18 NOTE — Progress Notes (Signed)
 Hgb is 11; hold retacrit today

## 2023-08-26 ENCOUNTER — Ambulatory Visit: Payer: Medicare Other | Admitting: Nurse Practitioner

## 2023-08-26 DIAGNOSIS — E559 Vitamin D deficiency, unspecified: Secondary | ICD-10-CM

## 2023-08-26 DIAGNOSIS — I700 Atherosclerosis of aorta: Secondary | ICD-10-CM

## 2023-08-26 DIAGNOSIS — E538 Deficiency of other specified B group vitamins: Secondary | ICD-10-CM

## 2023-08-26 DIAGNOSIS — D631 Anemia in chronic kidney disease: Secondary | ICD-10-CM

## 2023-08-26 DIAGNOSIS — E1169 Type 2 diabetes mellitus with other specified complication: Secondary | ICD-10-CM

## 2023-08-26 DIAGNOSIS — K219 Gastro-esophageal reflux disease without esophagitis: Secondary | ICD-10-CM

## 2023-08-26 DIAGNOSIS — E1122 Type 2 diabetes mellitus with diabetic chronic kidney disease: Secondary | ICD-10-CM

## 2023-08-26 DIAGNOSIS — I152 Hypertension secondary to endocrine disorders: Secondary | ICD-10-CM

## 2023-08-26 DIAGNOSIS — E1149 Type 2 diabetes mellitus with other diabetic neurological complication: Secondary | ICD-10-CM

## 2023-09-01 ENCOUNTER — Other Ambulatory Visit: Payer: Self-pay | Admitting: Nurse Practitioner

## 2023-09-24 ENCOUNTER — Ambulatory Visit: Payer: Self-pay | Admitting: Nurse Practitioner

## 2023-09-28 ENCOUNTER — Ambulatory Visit (INDEPENDENT_AMBULATORY_CARE_PROVIDER_SITE_OTHER)

## 2023-09-28 ENCOUNTER — Encounter: Payer: Self-pay | Admitting: Emergency Medicine

## 2023-09-28 ENCOUNTER — Ambulatory Visit
Admission: EM | Admit: 2023-09-28 | Discharge: 2023-09-28 | Disposition: A | Discharge status: Home / Self Care | Attending: Emergency Medicine | Admitting: Emergency Medicine

## 2023-09-28 DIAGNOSIS — R509 Fever, unspecified: Secondary | ICD-10-CM | POA: Diagnosis not present

## 2023-09-28 DIAGNOSIS — I10 Essential (primary) hypertension: Secondary | ICD-10-CM | POA: Insufficient documentation

## 2023-09-28 DIAGNOSIS — J441 Chronic obstructive pulmonary disease with (acute) exacerbation: Secondary | ICD-10-CM | POA: Diagnosis not present

## 2023-09-28 DIAGNOSIS — R051 Acute cough: Secondary | ICD-10-CM | POA: Diagnosis not present

## 2023-09-28 DIAGNOSIS — Z981 Arthrodesis status: Secondary | ICD-10-CM | POA: Diagnosis not present

## 2023-09-28 DIAGNOSIS — J189 Pneumonia, unspecified organism: Secondary | ICD-10-CM | POA: Insufficient documentation

## 2023-09-28 DIAGNOSIS — I771 Stricture of artery: Secondary | ICD-10-CM | POA: Diagnosis not present

## 2023-09-28 DIAGNOSIS — R059 Cough, unspecified: Secondary | ICD-10-CM | POA: Diagnosis not present

## 2023-09-28 LAB — BASIC METABOLIC PANEL WITH GFR
Anion gap: 11 (ref 5–15)
BUN: 27 mg/dL — ABNORMAL HIGH (ref 8–23)
CO2: 25 mmol/L (ref 22–32)
Calcium: 9.6 mg/dL (ref 8.9–10.3)
Chloride: 100 mmol/L (ref 98–111)
Creatinine, Ser: 1.04 mg/dL — ABNORMAL HIGH (ref 0.44–1.00)
GFR, Estimated: 54 mL/min — ABNORMAL LOW (ref >60)
Glucose, Bld: 129 mg/dL — ABNORMAL HIGH (ref 70–99)
Potassium: 4.1 mmol/L (ref 3.5–5.1)
Sodium: 136 mmol/L (ref 135–145)

## 2023-09-28 MED ORDER — LEVOFLOXACIN 750 MG PO TABS: 750.0000 mg | ORAL_TABLET | Freq: Every day | ORAL | 0 refills | Status: AC

## 2023-09-28 MED ORDER — ALBUTEROL SULFATE HFA 108 (90 BASE) MCG/ACT IN AERS: 1.0000 | INHALATION_SPRAY | RESPIRATORY_TRACT | 0 refills | Status: AC | PRN

## 2023-09-28 MED ORDER — PROMETHAZINE-DM 6.25-15 MG/5ML PO SYRP
5.0000 mL | ORAL_SOLUTION | Freq: Four times a day (QID) | ORAL | 0 refills | Status: DC | PRN
Start: 2023-09-28 — End: 2023-10-09

## 2023-09-28 MED ORDER — AEROCHAMBER MV MISC: 1 refills | Status: AC

## 2023-09-28 MED ORDER — ACETAMINOPHEN 325 MG PO TABS
975.0000 mg | ORAL_TABLET | Freq: Once | ORAL | Status: AC
  Administered 2023-09-28: 975 mg via ORAL

## 2023-09-28 MED ORDER — IPRATROPIUM-ALBUTEROL 0.5-2.5 (3) MG/3ML IN SOLN
3.0000 mL | Freq: Once | RESPIRATORY_TRACT | Status: AC
  Administered 2023-09-28: 3 mL via RESPIRATORY_TRACT

## 2023-09-28 MED ORDER — PREDNISONE 20 MG PO TABS: 40.0000 mg | ORAL_TABLET | Freq: Every day | ORAL | 0 refills | Status: AC

## 2023-09-28 NOTE — ED Provider Notes (Signed)
 HPI  SUBJECTIVE:  Tina Mcdonald is a 83 y.o. female who presents with a cough productive of grayish-green mucus since last week with nasal congestion, rhinorrhea, postnasal drip, wheezing, shortness of breath, worsening dyspnea on exertion.  She is unable to sleep at night because of the cough.  No fevers, headaches, chest pain, nausea, vomiting, sinus pain or pressure, sore throat.  No antibiotics in the past 3 months.  No antipyretic in the past 6 hours.  She tried increasing fluids, using over-the-counter cough medicine and tramadol.  Symptoms are better with sitting still, worse with lying down and coughing.  She has had symptoms like this before when she has had severe cough due to exposure to pollen.  She reports diffuse constant muscle spasms over her neck, back and shoulders starting last night.  The spasms are worse with movement.  She denies muscle spasms in her legs or elsewhere. She has a past medical history of COPD, chronic kidney disease stage IIIa, diabetes, diabetic neuropathy, GERD, hyperlipidemia, hypertension, osteoarthritis.  PCP: [facility]   Past Medical History:  Diagnosis Date   Anemia    Arthritis    Chronic kidney disease    Colon polyps    Diabetes mellitus without complication (HCC)    Diabetic neuropathy (HCC)    Dyspnea    GERD (gastroesophageal reflux disease)    Hyperlipemia    Hypertension    Osteoarthritis    Radiculopathy    weakness in right leg   Sleep apnea    does not have her cpap anymore   Stress incontinence    Urticaria    Vitamin B 12 deficiency    Wears glasses     Past Surgical History:  Procedure Laterality Date   ABDOMINAL HYSTERECTOMY     APPENDECTOMY     BREAST BIOPSY Right    right-neg   BREAST BIOPSY Left    us/bx/clip-neg   CARPAL TUNNEL RELEASE     right   COLONOSCOPY     COLONOSCOPY WITH PROPOFOL N/A 12/10/2015   Procedure: COLONOSCOPY WITH PROPOFOL;  Surgeon: Midge Minium, MD;  Location: [facility];  Service: Endoscopy;  Laterality: N/A;  Diabetic - oral meds Sleep Apnea LATEX allergy   COLONOSCOPY WITH PROPOFOL N/A 01/17/2019   Procedure: COLONOSCOPY WITH PROPOFOL;  Surgeon: Pasty Spillers, MD;  Location: [facility];  Service: Endoscopy;  Laterality: N/A;   CYSTOSCOPY     DILATION AND CURETTAGE OF UTERUS     ESOPHAGOGASTRODUODENOSCOPY (EGD) WITH PROPOFOL N/A 01/17/2019   Procedure: ESOPHAGOGASTRODUODENOSCOPY (EGD) WITH PROPOFOL;  Surgeon: Pasty Spillers, MD;  Location: [facility];  Service: Endoscopy;  Laterality: N/A;   EYE SURGERY Bilateral    lens implant   GIVENS CAPSULE STUDY N/A 05/04/2019   Procedure: GIVENS CAPSULE STUDY;  Surgeon: Pasty Spillers, MD;  Location: [facility];  Service: Endoscopy;  Laterality: N/A;   LUMBAR FUSION  2009   POLYPECTOMY  12/10/2015   Procedure: POLYPECTOMY;  Surgeon: Midge Minium, MD;  Location: [facility];  Service: Endoscopy;;   POLYPECTOMY  01/17/2019   Procedure: POLYPECTOMY;  Surgeon: Pasty Spillers, MD;  Location: [facility];  Service: Endoscopy;;   RESECTION DISTAL CLAVICAL Right 11/25/2013   Procedure: RIGHT SHOULDER OPEN RESECTION DISTAL CLAVICAL EXCISION SOFT TISSUE TUMOR SHOULDER DEEP SUBFASCIAL INTRAMUSCULAR/DEBRIDEMENT/ROTATOR CUFF REPAIR/BICEPS TENODESIS/MANIPULATION;  Surgeon: Sheral Apley, MD;  Location: [facility];  Service: Orthopedics;  Laterality: Right;   SHOULDER ARTHROSCOPY WITH ROTATOR CUFF  REPAIR AND SUBACROMIAL DECOMPRESSION Right 11/25/2013   Procedure: SHOULDER ARTHROSCOPY WITH DEBRIDEMENT ROTATOR CUFF REPAIR  A;  Surgeon: Sheral Apley, MD;  Location: [facility];  Service: Orthopedics;  Laterality: Right;    Family History  Problem Relation Age of Onset   Heart disease Mother    Diabetes Mother    Hypertension Mother    Heart disease Father    Hypertension Father    Diabetes Sister    Hypertension Sister    Heart  disease Sister    Kidney disease Sister    Diabetes Sister    Hypertension Sister    Kidney disease Sister    Non-Hodgkin's lymphoma Maternal Uncle    Breast cancer Cousin 15    Social History   Tobacco Use   Smoking status: Former    Current packs/day: 0.00    Average packs/day: 1 pack/day for 15.0 years (15.0 ttl pk-yrs)    Types: Cigarettes    Start date: 11/23/1982    Quit date: 11/22/1997    Years since quitting: 25.8   Smokeless tobacco: Never  Vaping Use   Vaping status: Never Used  Substance Use Topics   Alcohol use: No   Drug use: No    No current facility-administered medications for this encounter.  Current Outpatient Medications:    acetaminophen (TYLENOL) 325 MG tablet, Take 650 mg by mouth every 6 (six) hours as needed., Disp: , Rfl:    albuterol (VENTOLIN HFA) 108 (90 Base) MCG/ACT inhaler, Inhale 1-2 puffs into the lungs every 4 (four) hours as needed for wheezing or shortness of breath., Disp: 1 each, Rfl: 0   Blood Glucose Monitoring Suppl (ONETOUCH VERIO) w/Device KIT, To check blood sugar twice a day with goal fasting in morning <130 and goal 2 hours after meal <180.  Write all checks down for provider visits., Disp: 1 kit, Rfl: 0   carvedilol (COREG) 25 MG tablet, Take 25 mg by mouth 2 (two) times daily., Disp: , Rfl:    chlorthalidone (HYGROTON) 25 MG tablet, Take 25 mg by mouth daily., Disp: , Rfl:    Cholecalciferol (VITAMIN D-1000 MAX ST) 25 MCG (1000 UT) tablet, Take by mouth., Disp: , Rfl:    cloNIDine (CATAPRES) 0.1 MG tablet, Take 1 tablet by mouth 2 (two) times daily., Disp: , Rfl:    cyanocobalamin (VITAMIN B12) 1000 MCG tablet, Take 1,000 mcg by mouth daily., Disp: , Rfl:    diclofenac sodium (VOLTAREN) 1 % GEL, Apply 2 g topically 2 (two) times daily as needed., Disp: 1 Tube, Rfl: 2   diltiazem (CARDIZEM CD) 120 MG 24 hr capsule, Take by mouth daily., Disp: , Rfl:    ezetimibe (ZETIA) 10 MG tablet, Take 1 tablet (10 mg total) by mouth daily., Disp:  90 tablet, Rfl: 3   fluticasone (FLONASE) 50 MCG/ACT nasal spray, Place into both nostrils., Disp: , Rfl:    gabapentin (NEURONTIN) 100 MG capsule, SMARTSIG:Capsule(s) By Mouth, Disp: , Rfl:    glucose blood (ONETOUCH VERIO) test strip, Use as instructed, Disp: 100 each, Rfl: 12   irbesartan (AVAPRO) 150 MG tablet, Take 150 mg by mouth daily., Disp: , Rfl:    Lancets (ONETOUCH ULTRASOFT) lancets, Use as instructed, Disp: 100 each, Rfl: 12   levofloxacin (LEVAQUIN) 750 MG tablet, Take 1 tablet (750 mg total) by mouth daily for 5 days., Disp: 5 tablet, Rfl: 0   omeprazole (PRILOSEC) 20 MG capsule, Take 1 capsule (20 mg total) by mouth 2 (two) times  daily before a meal., Disp: 180 capsule, Rfl: 4   predniSONE (DELTASONE) 20 MG tablet, Take 2 tablets (40 mg total) by mouth daily with breakfast for 5 days., Disp: 10 tablet, Rfl: 0   promethazine-dextromethorphan (PROMETHAZINE-DM) 6.25-15 MG/5ML syrup, Take 5 mLs by mouth 4 (four) times daily as needed for cough., Disp: 118 mL, Rfl: 0   Spacer/Aero-Holding Chambers (AEROCHAMBER MV) inhaler, Use as instructed, Disp: 1 each, Rfl: 1   triamcinolone cream (KENALOG) 0.1 %, APPLY  CREAM EXTERNALLY TWICE DAILY, Disp: 30 g, Rfl: 0   Evolocumab (REPATHA SURECLICK) 140 MG/ML SOAJ, Inject 140 mg into the skin every 14 (fourteen) days. (Patient not taking: Reported on 07/09/2023), Disp: 2 mL, Rfl: 12  Allergies  Allergen Reactions   Statins Other (See Comments)    Rhabdomyolysis   Contrast Media [Iodinated Contrast Media] Itching   Penicillins Itching    Has patient had a PCN reaction causing immediate rash, facial/tongue/throat swelling, SOB or lightheadedness with hypotension: Yes Has patient had a PCN reaction causing severe rash involving mucus membranes or skin necrosis: Unknown Has patient had a PCN reaction that required hospitalization: Yes Has patient had a PCN reaction occurring within the last 10 years: Yes If all of the above answers are "NO", then  may proceed with Cephalosporin use.   Sulfa Antibiotics Itching   Aloe Itching and Rash   Latex Rash   Neosporin [Neomycin-Bacitracin Zn-Polymyx] Itching and Rash   Other Rash    Pt has reaction to high acidity foods.      ROS  As noted in HPI.   Physical Exam  BP (!) 171/83 (BP Location: Left Arm)   Pulse 92   Temp (!) 100.8 F (38.2 C) (Oral)   Resp 16   Ht 5\' 1"  (1.549 m)   Wt 74.8 kg   LMP  (LMP Unknown)   SpO2 95%   BMI 31.18 kg/m  BP Readings from Last 3 Encounters:  09/28/23 (!) 171/83  07/07/23 (!) 120/57  06/22/23 127/68    Constitutional: Well developed, well nourished, appears uncomfortable. Eyes: PERRL, EOMI, conjunctiva normal bilaterally HENT: Normocephalic, atraumatic,mucus membranes moist.  Mild nasal congestion.  Normal turbinates.  No maxillary, frontal sinus tenderness.  No postnasal drip. Neck: No cervical lymphadenopathy Respiratory: Clear to auscultation bilaterally, no rales, no wheezing, no rhonchi.  Poor air movement.  Limited inspiratory effort. Cardiovascular: Normal rate and rhythm, no murmurs, no gallops, no rubs GI: nondistended skin: No rash, skin intact Musculoskeletal: Diffuse trapezial tenderness, spasm, diffuse bilateral posterior thoracic and paralumbar tenderness.  No anterior, lateral chest wall tenderness. Neurologic: Alert & oriented x 3, CN III-XII grossly intact, no motor deficits, sensation grossly intact Psychiatric: Speech and behavior appropriate   ED Course   Medications  ipratropium-albuterol (DUONEB) 0.5-2.5 (3) MG/3ML nebulizer solution 3 mL (3 mLs Nebulization Given 09/28/23 0913)  acetaminophen (TYLENOL) tablet 975 mg (975 mg Oral Given 09/28/23 0852)    Orders Placed This Encounter  Procedures   DG Chest 2 View    Standing Status:   Standing    Number of Occurrences:   1    Reason for Exam (SYMPTOM  OR DIAGNOSIS REQUIRED):   Cough for a week, fever, rule pneumonia   Basic metabolic panel    Standing Status:    Standing    Number of Occurrences:   1   Results for orders placed or performed during the hospital encounter of 09/28/23 (from the past 24 hours)  Basic metabolic panel  Status: Abnormal   Collection Time: 09/28/23  8:51 AM  Result Value Ref Range   Sodium 136 135 - 145 mmol/L   Potassium 4.1 3.5 - 5.1 mmol/L   Chloride 100 98 - 111 mmol/L   CO2 25 22 - 32 mmol/L   Glucose, Bld 129 (H) 70 - 99 mg/dL   BUN 27 (H) 8 - 23 mg/dL   Creatinine, Ser 2.95 (H) 0.44 - 1.00 mg/dL   Calcium 9.6 8.9 - 62.1 mg/dL   GFR, Estimated 54 (L) >60 mL/min   Anion gap 11 5 - 15   DG Chest 2 View Result Date: 09/28/2023 CLINICAL DATA:  83 year old female with cough and fever. Neck and back pain. EXAM: CHEST - 2 VIEW COMPARISON:  Cardiac CT 01/07/2022 and earlier. FINDINGS: PA and lateral views 0855 hours. Stable heart and mediastinal contour since 2023. Mild tortuosity of the thoracic aorta. Normal cardiac size and mediastinal contours. Lungs appear stable since 2023, centrally clear. No pneumothorax or pleural effusion. Partially visible chronic lumbar spine fusion hardware. Chronic disc and endplate degeneration in the thoracic spine. No acute osseous abnormality identified. Negative visible bowel gas. IMPRESSION: No acute cardiopulmonary abnormality. Electronically Signed   By: Odessa Fleming M.D.   On: 09/28/2023 09:26    ED Clinical Impression  1. Acute cough   2. Community acquired pneumonia, unspecified laterality   3. COPD exacerbation (HCC)   4. Elevated blood pressure reading with diagnosis of hypertension      ED Assessment/Plan     Patient presents with acute illness with systemic symptoms of fever. 1.  Cough, fever.  Suspect muscle spasms are from all the coughing that she has been doing.  She states this happens every year with high pollen count.  Will check chest x-ray due to fever.  Also checking basic metabolic panel as I am unable to see any record of kidney function in Care Everywhere or  [facility] since 05/06/2023.  Giving Tylenol 975 mg p.o. and a DuoNeb.  Will reevaluate.  Calculated creatinine clearance from today's labs 50 mL/min.  Reviewed imaging independently.  No acute cardiopulmonary disease per radiology.  On reevaluation post neb treatment, patient states that she feels better.  Improved air movement.  Still clear.  Suspect pneumonia not showing up on x-ray/COPD exacerbation.  Will send home with regularly scheduled albuterol inhaler with a spacer for 4 days, then as needed thereafter, Levaquin 750 mg daily for 5 days as she is at risk for poor outcome this is a COPD exacerbation.  Prednisone 40 mg for 5 days.  Promethazine DM for cough.  Tylenol 1000 mg 3-4 times a day as needed for pain, fever.  Calculated creatinine clearance 50 mL/min.  Do not need to renally adjust Levaquin.  Do not need to renally adjust Promethazine DM.  She has hypotension with penicillins.  2.  Elevated blood-pressure reading with diagnosis of hypertension.  Did not take her blood pressure medications this morning, states that has been running within normal limits at home.  She also appears to be uncomfortable with likely contributing to this.  She has no other signs or symptoms of hypertensive emergency.  Discussed labs, imaging, MDM, treatment plan, and plan for follow-up with patient Discussed sn/sx that should prompt return to the ED. patient agrees with plan.   Meds ordered this encounter  Medications   ipratropium-albuterol (DUONEB) 0.5-2.5 (3) MG/3ML nebulizer solution 3 mL   acetaminophen (TYLENOL) tablet 975 mg   albuterol (VENTOLIN HFA) 108 (  90 Base) MCG/ACT inhaler    Sig: Inhale 1-2 puffs into the lungs every 4 (four) hours as needed for wheezing or shortness of breath.    Dispense:  1 each    Refill:  0   Spacer/Aero-Holding Chambers (AEROCHAMBER MV) inhaler    Sig: Use as instructed    Dispense:  1 each    Refill:  1   predniSONE (DELTASONE) 20 MG tablet    Sig: Take 2 tablets  (40 mg total) by mouth daily with breakfast for 5 days.    Dispense:  10 tablet    Refill:  0   levofloxacin (LEVAQUIN) 750 MG tablet    Sig: Take 1 tablet (750 mg total) by mouth daily for 5 days.    Dispense:  5 tablet    Refill:  0   promethazine-dextromethorphan (PROMETHAZINE-DM) 6.25-15 MG/5ML syrup    Sig: Take 5 mLs by mouth 4 (four) times daily as needed for cough.    Dispense:  118 mL    Refill:  0      *This clinic note was created using Scientist, clinical (histocompatibility and immunogenetics). Therefore, there may be occasional mistakes despite careful proofreading. ?    Domenick Gong, MD 09/29/23 [phone]

## 2023-09-28 NOTE — ED Triage Notes (Signed)
 Pt c/o muscle spasms in neck & back x1 day. Has tried tramadol w/o relief.

## 2023-09-28 NOTE — Discharge Instructions (Signed)
 I am concerned that you have a pneumonia/COPD exacerbation.  Take two puffs from your albuterol inhaler with your spacer every 4 hours for 2 days, then every 6 hours for 2 days, then as needed. You can back off if you start to improve  sooner. Finish the steroids unless your doctor tells you to stop. Finish the antibiotics, even if you feel better.Take tylenol 1 gram up to 3-4 times a day as needed for pain. Make sure you drink extra fluids. Return to the ER if you get worse, have a fever >100.4, or any other concerns.   Promethazine DM for cough.  If the spacer is too expensive at the pharmacy, you can get an AeroChamber Z-Stat off of [employer] for about $10-$15.  Go to www.goodrx.com  or www.costplusdrugs.com to look up your medications. This will give you a list of where you can find your prescriptions at the most affordable prices. Or ask the pharmacist what the cash price is, or if they have any other discount programs available to help make your medication more affordable. This can be less expensive than what you would pay with insurance.

## 2023-09-29 ENCOUNTER — Ambulatory Visit: Payer: Medicare Other

## 2023-09-29 ENCOUNTER — Inpatient Hospital Stay: Payer: Medicare Other

## 2023-09-29 ENCOUNTER — Inpatient Hospital Stay: Payer: Medicare Other | Attending: Oncology

## 2023-09-29 ENCOUNTER — Other Ambulatory Visit: Payer: Medicare Other

## 2023-09-29 ENCOUNTER — Ambulatory Visit: Payer: Medicare Other | Admitting: Nurse Practitioner

## 2023-10-04 NOTE — Patient Instructions (Signed)

## 2023-10-09 ENCOUNTER — Ambulatory Visit: Admitting: Nurse Practitioner

## 2023-10-09 ENCOUNTER — Encounter: Payer: Self-pay | Admitting: Nurse Practitioner

## 2023-10-09 VITALS — BP 118/64 | HR 71 | Temp 98.7°F | Ht 62.0 in | Wt 164.0 lb

## 2023-10-09 DIAGNOSIS — E1122 Type 2 diabetes mellitus with diabetic chronic kidney disease: Secondary | ICD-10-CM | POA: Diagnosis not present

## 2023-10-09 DIAGNOSIS — Z8739 Personal history of other diseases of the musculoskeletal system and connective tissue: Secondary | ICD-10-CM | POA: Diagnosis not present

## 2023-10-09 DIAGNOSIS — E1159 Type 2 diabetes mellitus with other circulatory complications: Secondary | ICD-10-CM | POA: Diagnosis not present

## 2023-10-09 DIAGNOSIS — E1149 Type 2 diabetes mellitus with other diabetic neurological complication: Secondary | ICD-10-CM

## 2023-10-09 DIAGNOSIS — I152 Hypertension secondary to endocrine disorders: Secondary | ICD-10-CM | POA: Diagnosis not present

## 2023-10-09 DIAGNOSIS — E785 Hyperlipidemia, unspecified: Secondary | ICD-10-CM

## 2023-10-09 DIAGNOSIS — I700 Atherosclerosis of aorta: Secondary | ICD-10-CM

## 2023-10-09 DIAGNOSIS — D631 Anemia in chronic kidney disease: Secondary | ICD-10-CM | POA: Diagnosis not present

## 2023-10-09 DIAGNOSIS — N183 Chronic kidney disease, stage 3 unspecified: Secondary | ICD-10-CM

## 2023-10-09 DIAGNOSIS — N1831 Chronic kidney disease, stage 3a: Secondary | ICD-10-CM

## 2023-10-09 DIAGNOSIS — E1169 Type 2 diabetes mellitus with other specified complication: Secondary | ICD-10-CM

## 2023-10-09 LAB — MICROALBUMIN, URINE WAIVED
Creatinine, Urine Waived: 200 mg/dL (ref 10–300)
Microalb, Ur Waived: 30 mg/L — ABNORMAL HIGH (ref 0–19)
Microalb/Creat Ratio: <30 mg/g (ref <30)

## 2023-10-09 LAB — BAYER DCA HB A1C WAIVED: HB A1C (BAYER DCA - WAIVED): 6.7 % — ABNORMAL HIGH (ref 4.8–5.6)

## 2023-10-09 MED ORDER — OMEPRAZOLE 20 MG PO CPDR: 20.0000 mg | DELAYED_RELEASE_CAPSULE | Freq: Two times a day (BID) | ORAL | 4 refills | Status: DC

## 2023-10-09 NOTE — Assessment & Plan Note (Signed)
 Chronic, stable. BP well below goal in office. Recommend BP checks at home a few times per week at home. DASH diet at home. Continue all current medications, reviewed with her.  She is to message provider if levels consistently >130/80 at home with changes. Labs: CMP.  Urine ALB 21 October 2023, continue Irbesartan .

## 2023-10-09 NOTE — Assessment & Plan Note (Signed)
Chronic, ongoing noted on CT imaging 01/26/17. Unable to tolerate statins. Continue taking Praluent every 14 days.  

## 2023-10-09 NOTE — Assessment & Plan Note (Addendum)
 Chronic, ongoing. Unable to take statin due to rhabdo with associated AKI. Continue taking Praulent and Zetia  at this time.  Lipid panel today.

## 2023-10-09 NOTE — Assessment & Plan Note (Addendum)
 Chronic, ongoing. A1c stable at 6.7% today, remaining stable with diet control. Continue collaboration with nephrology locally and at [facility], recent labs reviewed.  Urine ALB 21 October 2023, continue ARB.  Consider addition of Farxiga in future for kidney and heart health.  Return in 3 months.  - Up to date on eye and foot exam - Vaccinations up to date - ARB on board and Praluent  -- can not take statin

## 2023-10-09 NOTE — Assessment & Plan Note (Signed)
 Chronic, ongoing. A1c stable at 6.7% today, remaining stable with diet control. Continue collaboration with nephrology locally and at [facility], recent labs reviewed.  Urine ALB 21 October 2023, continue ARB.  Consider addition of Farxiga in future for kidney and heart health.  Return in 3 months.  - Up to date on eye and foot exam - Vaccinations up to date - ARB on board and Praluent  -- can not take statin - Referral to podiatry per request.

## 2023-10-09 NOTE — Assessment & Plan Note (Signed)
Chronic, stable. Continue collaboration with hematology as needed. Labs up to date.

## 2023-10-09 NOTE — Progress Notes (Signed)
 BP 118/64   Pulse 71   Temp 98.7 F (37.1 C) (Oral)   Ht 5' 2 (1.575 m)   Wt 164 lb (74.4 kg)   LMP  (LMP Unknown)   SpO2 98%   BMI 30.00 kg/m    Subjective:    Patient ID: Tina Mcdonald Sar, female    DOB: June 09, 1941, 83 y.o.   MRN: [id number]  HPI: MELLISSA CONLEY is a 83 y.o. female  Chief Complaint  Patient presents with   Chronic Kidney Disease   Diabetes   Hyperlipidemia   Hypertension   DIABETES Last A1c was 6.1% in December.  No current medications for diabetes, diet focus.  Takes Gabapentin  300 MG at night for radiculopathy and leg discomfort. Past diabetes medication: Metformin .  Has been eating lots of sweets recently.  She is requesting referral to podiatry for diabetic foot care, thickened toenails. Hypoglycemic episodes:no Polydipsia/polyuria: no Visual disturbance: no Chest pain: no Paresthesias: no Glucose Monitoring: yes             Accucheck frequency: not checking             Fasting glucose:              Post prandial:             Evening:             Before meals: Taking Insulin ?: no             Long acting insulin :             Short acting insulin : Blood Pressure Monitoring: occasional Retinal Examination: Up To Date -- [employer] Foot Exam: Up to Date Pneumovax: Up to Date Influenza: Up to Date Aspirin : no   HYPERTENSION / HYPERLIPIDEMIA Continues Carvedilol, Chlorthalidone, Avapro , and Diltiazem .  History of aortic atherosclerosis noted on past CT 01/26/17. MRI heart on 10/16/22 last with mild LVH, EF >65%, mild mitral valve regurgitation, idiopathic hypotrophic subaortic stenosis. Last cardiology visit 02/17/23. Currently taking Praluent  for HLD every 14 days and Zetia .  Treated on 09/28/23 for possible PNA, seen at [facility] and treated with Levofloxacin  and Prednisone .  Has completed treatment.  History: Statin reaction in past that led to admission to hospital and AKI.  Satisfied with current treatment? yes Duration of hypertension: chronic BP  monitoring frequency: not checking BP range:  BP medication side effects: no Duration of hyperlipidemia: chronic Cholesterol medication side effects: no Cholesterol supplements: none Medication compliance: good compliance Aspirin : no Recent stressors: no Recurrent headaches: no Visual changes: no Palpitations: no Dyspnea: occasional at baseline Chest pain: no Lower extremity edema: no Dizzy/lightheaded: no  CHRONIC KIDNEY DISEASE Follows with nephrology [facility], last visit 05/06/23. Hematology last 07/07/23. CKD status: stable Medications renally dose: yes Previous renal evaluation: yes Pneumovax:  Up to Date Influenza Vaccine:  Up to Date    Latest Ref Rng & Units 09/28/2023    8:51 AM 03/23/2023   10:15 AM 01/26/2023   11:42 AM  BMP  Glucose 70 - 99 mg/dL 870  893  893   BUN 8 - 23 mg/dL 27  30  27    Creatinine 0.44 - 1.00 mg/dL 8.95  8.78  8.79   BUN/Creat Ratio 12 - 28  25    Sodium 135 - 145 mmol/L 136  141  136   Potassium 3.5 - 5.1 mmol/L 4.1  4.5  4.0   Chloride 98 - 111 mmol/L 100  104  105   CO2 22 -  32 mmol/L 25  22  24    Calcium  8.9 - 10.3 mg/dL 9.6  9.8  9.2        5/81/7974   10:55 AM 07/09/2023    9:34 AM 06/22/2023   10:30 AM 03/23/2023   10:07 AM 01/20/2023   10:48 AM  Depression screen PHQ 2/9  Decreased Interest 1 0 0 1 0  Down, Depressed, Hopeless 0 0 0 0 0  PHQ - 2 Score 1 0 0 1 0  Altered sleeping 0 0 0 2 0  Tired, decreased energy 2 0 3 2 2   Change in appetite 2 0 3 1 0  Feeling bad or failure about yourself  0 0 0 0 0  Trouble concentrating 0 0 0 0 0  Moving slowly or fidgety/restless 0 0 0 0 0  Suicidal thoughts 0 0 0 0 0  PHQ-9 Score 5 0 6 6 2   Difficult doing work/chores Not difficult at all Not difficult at all Not difficult at all Somewhat difficult Not difficult at all       06/22/2023   10:30 AM 03/23/2023   10:08 AM 01/20/2023   10:48 AM 12/23/2022   10:48 AM  GAD 7 : Generalized Anxiety Score  Nervous, Anxious, on Edge 0 0 0 0   Control/stop worrying 0 0 0 0  Worry too much - different things 0 0 0 0  Trouble relaxing 0 0 0 0  Restless 0 0 0 0  Easily annoyed or irritable 0 0 0 0  Afraid - awful might happen 0 0 0 0  Total GAD 7 Score 0 0 0 0  Anxiety Difficulty  Not difficult at all Not difficult at all Not difficult at all    Relevant past medical, surgical, family and social history reviewed and updated as indicated. Interim medical history since our last visit reviewed. Allergies and medications reviewed and updated.  Review of Systems  Constitutional:  Negative for activity change, appetite change, diaphoresis, fatigue and fever.  Respiratory:  Negative for cough, chest tightness, shortness of breath and wheezing.   Cardiovascular:  Negative for chest pain, palpitations and leg swelling.  Gastrointestinal: Negative.   Endocrine: Negative for cold intolerance, heat intolerance, polydipsia, polyphagia and polyuria.  Neurological: Negative.   Psychiatric/Behavioral: Negative.      Per HPI unless specifically indicated above     Objective:    BP 118/64   Pulse 71   Temp 98.7 F (37.1 C) (Oral)   Ht 5' 2 (1.575 m)   Wt 164 lb (74.4 kg)   LMP  (LMP Unknown)   SpO2 98%   BMI 30.00 kg/m   Wt Readings from Last 3 Encounters:  10/09/23 164 lb (74.4 kg)  09/28/23 165 lb (74.8 kg)  07/09/23 164 lb (74.4 kg)    Physical Exam Vitals and nursing note reviewed.  Constitutional:      General: She is awake. She is not in acute distress.    Appearance: She is well-developed and well-groomed. She is obese. She is not ill-appearing or toxic-appearing.  HENT:     Head: Normocephalic.     Right Ear: Hearing normal.     Left Ear: Hearing normal.  Eyes:     General: Lids are normal.        Right eye: No discharge.        Left eye: No discharge.     Conjunctiva/sclera: Conjunctivae normal.     Pupils: Pupils are equal, round, and reactive to  light.  Neck:     Thyroid : No thyromegaly.     Vascular:  No carotid bruit.  Cardiovascular:     Rate and Rhythm: Normal rate and regular rhythm.     Heart sounds: Murmur heard.     Systolic murmur is present with a grade of 2/6.     No gallop.  Pulmonary:     Effort: Pulmonary effort is normal. No accessory muscle usage or respiratory distress.     Breath sounds: Normal breath sounds.  Abdominal:     General: Bowel sounds are normal.     Palpations: Abdomen is soft. There is no hepatomegaly or splenomegaly.  Musculoskeletal:     Cervical back: Normal range of motion and neck supple.     Lumbar back: Normal.     Right knee: Normal.     Left knee: Normal.     Right lower leg: No edema.     Left lower leg: No edema.  Lymphadenopathy:     Cervical: No cervical adenopathy.  Skin:    General: Skin is warm and dry.  Neurological:     Mental Status: She is alert and oriented to person, place, and time.  Psychiatric:        Attention and Perception: Attention normal.        Mood and Affect: Mood normal.        Speech: Speech normal.        Behavior: Behavior normal. Behavior is cooperative.        Thought Content: Thought content normal.    Results for orders placed or performed during the hospital encounter of 09/28/23  Basic metabolic panel   Collection Time: 09/28/23  8:51 AM  Result Value Ref Range   Sodium 136 135 - 145 mmol/L   Potassium 4.1 3.5 - 5.1 mmol/L   Chloride 100 98 - 111 mmol/L   CO2 25 22 - 32 mmol/L   Glucose, Bld 129 (H) 70 - 99 mg/dL   BUN 27 (H) 8 - 23 mg/dL   Creatinine, Ser 8.95 (H) 0.44 - 1.00 mg/dL   Calcium  9.6 8.9 - 10.3 mg/dL   GFR, Estimated 54 (L) >60 mL/min   Anion gap 11 5 - 15      Assessment & Plan:   Problem List Items Addressed This Visit       Cardiovascular and Mediastinum   Hypertension associated with diabetes (HCC)   Chronic, stable. BP well below goal in office. Recommend BP checks at home a few times per week at home. DASH diet at home. Continue all current medications, reviewed  with her.  She is to message provider if levels consistently >130/80 at home with changes. Labs: CMP.  Urine ALB 21 October 2023, continue Irbesartan .       Relevant Orders   Bayer DCA Hb A1c Waived   Comprehensive metabolic panel with GFR   Ambulatory referral to Podiatry   Aortic atherosclerosis (HCC)   Chronic, ongoing noted on CT imaging 01/26/17. Unable to tolerate statins. Continue taking Praluent  every 14 days.       Relevant Orders   Comprehensive metabolic panel with GFR   Lipid Panel w/o Chol/HDL Ratio     Endocrine   Hyperlipidemia associated with type 2 diabetes mellitus (HCC)   Chronic, ongoing. Unable to take statin due to rhabdo with associated AKI. Continue taking Praulent and Zetia  at this time.  Lipid panel today.      Relevant Orders   Bayer DCA Hb  A1c Waived   Comprehensive metabolic panel with GFR   Lipid Panel w/o Chol/HDL Ratio   Diabetic neuropathy (HCC)   Chronic, ongoing. A1c stable at 6.7% today, remaining stable with diet control. Continue collaboration with nephrology locally and at [facility], recent labs reviewed.  Urine ALB 21 October 2023, continue ARB.  Consider addition of Farxiga in future for kidney and heart health.  Return in 3 months.  - Up to date on eye and foot exam - Vaccinations up to date - ARB on board and Praluent  -- can not take statin - Referral to podiatry per request.      Relevant Orders   Bayer DCA Hb A1c Waived   Microalbumin, Urine Waived   Comprehensive metabolic panel with GFR   Ambulatory referral to Podiatry   CKD stage 3 due to type 2 diabetes mellitus (HCC) - Primary   Chronic, ongoing. A1c stable at 6.7% today, remaining stable with diet control. Continue collaboration with nephrology locally and at [facility], recent labs reviewed.  Urine ALB 21 October 2023, continue ARB.  Consider addition of Farxiga in future for kidney and heart health.  Return in 3 months.  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 Continue collaboration with hematology as needed. Labs up to date.        Other   History of rhabdomyolysis due to statin   Continue Praulent + Zetia  and adjust as needed. Unable to take statins, history of rhabdo and subsequent AKI.  Obtain labs today.       Follow up plan: Return in about 3 months (around 01/08/2024) for T2DM, HTN/HLD, CKD, GERD.

## 2023-10-09 NOTE — Assessment & Plan Note (Signed)
 Continue Praulent + Zetia  and adjust as needed. Unable to take statins, history of rhabdo and subsequent AKI.  Obtain labs today.

## 2023-10-10 ENCOUNTER — Encounter: Payer: Self-pay | Admitting: Nurse Practitioner

## 2023-10-10 LAB — LIPID PANEL W/O CHOL/HDL RATIO
Cholesterol, Total: 117 mg/dL (ref 100–199)
HDL: 53 mg/dL (ref >39)
LDL Chol Calc (NIH): 47 mg/dL (ref 0–99)
Triglycerides: 91 mg/dL (ref 0–149)
VLDL Cholesterol Cal: 17 mg/dL (ref 5–40)

## 2023-10-10 LAB — COMPREHENSIVE METABOLIC PANEL WITH GFR
ALT: 13 IU/L (ref 0–32)
AST: 15 IU/L (ref 0–40)
Albumin: 3.7 g/dL (ref 3.7–4.7)
Alkaline Phosphatase: 74 IU/L (ref 44–121)
BUN/Creatinine Ratio: 27 (ref 12–28)
BUN: 32 mg/dL — ABNORMAL HIGH (ref 8–27)
Bilirubin Total: <0.2 mg/dL (ref 0.0–1.2)
CO2: 20 mmol/L (ref 20–29)
Calcium: 9.6 mg/dL (ref 8.7–10.3)
Chloride: 107 mmol/L — ABNORMAL HIGH (ref 96–106)
Creatinine, Ser: 1.19 mg/dL — ABNORMAL HIGH (ref 0.57–1.00)
Globulin, Total: 2.4 g/dL (ref 1.5–4.5)
Glucose: 104 mg/dL — ABNORMAL HIGH (ref 70–99)
Potassium: 4.8 mmol/L (ref 3.5–5.2)
Sodium: 144 mmol/L (ref 134–144)
Total Protein: 6.1 g/dL (ref 6.0–8.5)
eGFR: 46 mL/min/{1.73_m2} — ABNORMAL LOW (ref >59)

## 2023-10-10 NOTE — Progress Notes (Signed)
 Contacted via MyChart   Good morning Tina Mcdonald, your labs have returned: - Kidney function, creatinine and eGFR, continues to show Stage 3a kidney disease with no worsening.  Great news.  Liver function, AST and ALT, is normal. - Lipid panel shows levels at goal, continue current medications.  Any questions? Keep being stellar!!  Thank you for allowing me to participate in your care.  I appreciate you. Kindest regards, Phillp Dolores

## 2023-10-19 DIAGNOSIS — E119 Type 2 diabetes mellitus without complications: Secondary | ICD-10-CM | POA: Diagnosis not present

## 2023-10-19 DIAGNOSIS — Z961 Presence of intraocular lens: Secondary | ICD-10-CM | POA: Diagnosis not present

## 2023-10-19 DIAGNOSIS — H04123 Dry eye syndrome of bilateral lacrimal glands: Secondary | ICD-10-CM | POA: Diagnosis not present

## 2023-10-19 LAB — HM DIABETES EYE EXAM

## 2023-11-03 ENCOUNTER — Ambulatory Visit: Admitting: Podiatry

## 2023-11-03 DIAGNOSIS — B351 Tinea unguium: Secondary | ICD-10-CM | POA: Diagnosis not present

## 2023-11-03 DIAGNOSIS — M79675 Pain in left toe(s): Secondary | ICD-10-CM | POA: Diagnosis not present

## 2023-11-03 DIAGNOSIS — M79674 Pain in right toe(s): Secondary | ICD-10-CM

## 2023-11-03 NOTE — Progress Notes (Signed)
  Subjective:  Patient ID: Tina Mcdonald, female    DOB: 01-26-1941,  MRN: [id number]  Chief Complaint  Patient presents with   Nail Problem    Nail trim    83 y.o. female returns for the above complaint.  Patient presents with thickened elongated dystrophic toenails x10.  Patient would like to know if there is any kind treatment options for it.  She would like to have them debrided down as she is not able to do it herself.  She denies any other acute complaints.  She would like to discuss topical options.  Objective:  There were no vitals filed for this visit. Podiatric Exam: Vascular: dorsalis pedis and posterior tibial pulses are palpable bilateral. Capillary return is immediate. Temperature gradient is WNL. Skin turgor WNL  Sensorium: Normal Semmes Weinstein monofilament test. Normal tactile sensation bilaterally. Nail Exam: Pt has thick disfigured discolored nails with subungual debris noted bilateral entire nail hallux through fifth toenails.  Pain on palpation to the nails. Ulcer Exam: There is no evidence of ulcer or pre-ulcerative changes or infection. Orthopedic Exam: Muscle tone and strength are WNL. No limitations in general ROM. No crepitus or effusions noted. HAV  B/L.  Hammer toes 2-5  B/L. Skin: No Porokeratosis. No infection or ulcers    Assessment & Plan:   1. Pain due to onychomycosis of toenails of both feet      Patient was evaluated and treated and all questions answered.  Onychomycosis with pain  -Nails palliatively debrided as below. -Educated on self-care -Penlac  was dispensed and the use was directed s for twice a day for next 6 to 8 months.   Procedure: Nail Debridement Rationale: pain  Type of Debridement: manual, sharp debridement. Instrumentation: Nail nipper, rotary burr. Number of Nails: 10  Procedures and Treatment: Consent by patient was obtained for treatment procedures. The patient understood the discussion of treatment and procedures  well. All questions were answered thoroughly reviewed. Debridement of mycotic and hypertrophic toenails, 1 through 5 bilateral and clearing of subungual debris. No ulceration, no infection noted.  Return Visit-Office Procedure: Patient instructed to return to the office for a follow up visit 3 months for continued evaluation and treatment.  [name], DPM    No follow-ups on file.

## 2023-11-05 DIAGNOSIS — E1122 Type 2 diabetes mellitus with diabetic chronic kidney disease: Secondary | ICD-10-CM | POA: Diagnosis not present

## 2023-11-05 DIAGNOSIS — I10 Essential (primary) hypertension: Secondary | ICD-10-CM | POA: Diagnosis not present

## 2023-11-05 DIAGNOSIS — N183 Chronic kidney disease, stage 3 unspecified: Secondary | ICD-10-CM | POA: Diagnosis not present

## 2023-11-09 ENCOUNTER — Other Ambulatory Visit: Payer: Self-pay | Admitting: Nurse Practitioner

## 2023-11-10 ENCOUNTER — Inpatient Hospital Stay: Payer: Medicare Other | Attending: Oncology

## 2023-11-10 DIAGNOSIS — N183 Chronic kidney disease, stage 3 unspecified: Secondary | ICD-10-CM | POA: Diagnosis not present

## 2023-11-10 DIAGNOSIS — D631 Anemia in chronic kidney disease: Secondary | ICD-10-CM | POA: Diagnosis not present

## 2023-11-10 DIAGNOSIS — R7989 Other specified abnormal findings of blood chemistry: Secondary | ICD-10-CM | POA: Insufficient documentation

## 2023-11-10 DIAGNOSIS — D563 Thalassemia minor: Secondary | ICD-10-CM | POA: Insufficient documentation

## 2023-11-10 DIAGNOSIS — Z87891 Personal history of nicotine dependence: Secondary | ICD-10-CM | POA: Diagnosis not present

## 2023-11-10 LAB — CBC WITH DIFFERENTIAL (CANCER CENTER ONLY)
Abs Immature Granulocytes: 0.03 10*3/uL (ref 0.00–0.07)
Basophils Absolute: 0 10*3/uL (ref 0.0–0.1)
Basophils Relative: 0 %
Eosinophils Absolute: 0.2 10*3/uL (ref 0.0–0.5)
Eosinophils Relative: 4 %
HCT: 35.9 % — ABNORMAL LOW (ref 36.0–46.0)
Hemoglobin: 11 g/dL — ABNORMAL LOW (ref 12.0–15.0)
Immature Granulocytes: 1 %
Lymphocytes Relative: 23 %
Lymphs Abs: 1.3 10*3/uL (ref 0.7–4.0)
MCH: 25.6 pg — ABNORMAL LOW (ref 26.0–34.0)
MCHC: 30.6 g/dL (ref 30.0–36.0)
MCV: 83.7 fL (ref 80.0–100.0)
Monocytes Absolute: 0.6 10*3/uL (ref 0.1–1.0)
Monocytes Relative: 10 %
Neutro Abs: 3.5 10*3/uL (ref 1.7–7.7)
Neutrophils Relative %: 62 %
Platelet Count: 186 10*3/uL (ref 150–400)
RBC: 4.29 MIL/uL (ref 3.87–5.11)
RDW: 14.6 % (ref 11.5–15.5)
WBC Count: 5.7 10*3/uL (ref 4.0–10.5)
nRBC: 0 % (ref 0.0–0.2)

## 2023-11-10 LAB — IRON AND TIBC
Iron: 48 ug/dL (ref 28–170)
Saturation Ratios: 18 % (ref 10.4–31.8)
TIBC: 262 ug/dL (ref 250–450)
UIBC: 214 ug/dL

## 2023-11-10 LAB — FERRITIN: Ferritin: 492 ng/mL — ABNORMAL HIGH (ref 11–307)

## 2023-11-10 LAB — VITAMIN B12: Vitamin B-12: 1075 pg/mL — ABNORMAL HIGH (ref 180–914)

## 2023-11-11 ENCOUNTER — Encounter (INDEPENDENT_AMBULATORY_CARE_PROVIDER_SITE_OTHER): Payer: Medicare Other | Admitting: Ophthalmology

## 2023-11-11 DIAGNOSIS — E113293 Type 2 diabetes mellitus with mild nonproliferative diabetic retinopathy without macular edema, bilateral: Secondary | ICD-10-CM

## 2023-11-11 DIAGNOSIS — H35371 Puckering of macula, right eye: Secondary | ICD-10-CM | POA: Diagnosis not present

## 2023-11-11 DIAGNOSIS — H43813 Vitreous degeneration, bilateral: Secondary | ICD-10-CM | POA: Diagnosis not present

## 2023-11-11 DIAGNOSIS — H43823 Vitreomacular adhesion, bilateral: Secondary | ICD-10-CM

## 2023-11-11 DIAGNOSIS — Z7984 Long term (current) use of oral hypoglycemic drugs: Secondary | ICD-10-CM | POA: Diagnosis not present

## 2023-11-11 DIAGNOSIS — I10 Essential (primary) hypertension: Secondary | ICD-10-CM | POA: Diagnosis not present

## 2023-11-11 DIAGNOSIS — H35033 Hypertensive retinopathy, bilateral: Secondary | ICD-10-CM | POA: Diagnosis not present

## 2023-11-11 NOTE — Telephone Encounter (Signed)
 Requested by interface surescripts. Last OV 10/09/23. Requested Prescriptions  Pending Prescriptions Disp Refills   omeprazole  (PRILOSEC) 20 MG capsule [Pharmacy Med Name: Omeprazole  20 MG Oral Capsule Delayed Release] 200 capsule 2    Sig: TAKE 1 CAPSULE BY MOUTH TWICE  DAILY BEFORE A MEAL     Gastroenterology: Proton Pump Inhibitors Failed - 11/11/2023  2:09 PM      Failed - Valid encounter within last 12 months    Recent Outpatient Visits           1 month ago CKD stage 3 due to type 2 diabetes mellitus (HCC)   [facility], Lavelle Posey, NP

## 2023-11-17 ENCOUNTER — Encounter: Payer: Self-pay | Admitting: Oncology

## 2023-11-17 ENCOUNTER — Inpatient Hospital Stay: Payer: Medicare Other

## 2023-11-17 ENCOUNTER — Inpatient Hospital Stay (HOSPITAL_BASED_OUTPATIENT_CLINIC_OR_DEPARTMENT_OTHER): Payer: Medicare Other | Admitting: Oncology

## 2023-11-17 VITALS — BP 140/67 | HR 68 | Temp 96.5°F | Resp 18 | Wt 166.5 lb

## 2023-11-17 DIAGNOSIS — R7989 Other specified abnormal findings of blood chemistry: Secondary | ICD-10-CM | POA: Diagnosis not present

## 2023-11-17 DIAGNOSIS — N1831 Chronic kidney disease, stage 3a: Secondary | ICD-10-CM

## 2023-11-17 DIAGNOSIS — D563 Thalassemia minor: Secondary | ICD-10-CM | POA: Diagnosis not present

## 2023-11-17 DIAGNOSIS — D631 Anemia in chronic kidney disease: Secondary | ICD-10-CM

## 2023-11-17 DIAGNOSIS — N183 Chronic kidney disease, stage 3 unspecified: Secondary | ICD-10-CM | POA: Diagnosis not present

## 2023-11-17 DIAGNOSIS — Z87891 Personal history of nicotine dependence: Secondary | ICD-10-CM | POA: Diagnosis not present

## 2023-11-17 NOTE — Assessment & Plan Note (Addendum)
 No intervention at this point.  Chronic microcytosis. 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  Parents with alpha thalassemia trait can pass it on to their children, so I suggest that her family members undergo screening and consider genetic counseling if they are planning to start a family.

## 2023-11-17 NOTE — Progress Notes (Signed)
 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 Lab Results  Component Value Date   HGB 11.0 (L) 11/10/2023   TIBC 262 11/10/2023   IRONPCTSAT 18 11/10/2023   FERRITIN 492 (H) 11/10/2023  Hemoglobin is >10 Hold off retacrit .  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  Chronic microcytosis. 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  Parents with alpha thalassemia trait can pass it on to their children, so I suggest that her family members undergo screening and consider genetic counseling if they are planning to start a family.   Elevated ferritin Off Slow Fe and vitamin C  supplementation for now. Negative hemochromatosis gene mutation Ferritin has improved.    Orders Placed This Encounter  Procedures   CBC with Differential (Cancer Center Only)    Standing Status:   Future    Expected Date:   05/03/2024    Expiration Date:   11/16/2024   Iron and TIBC    Standing Status:   Future    Expected Date:   05/03/2024    Expiration Date:   11/16/2024   Ferritin    Standing Status:   Future    Expected Date:   05/03/2024    Expiration Date:   11/16/2024   Hemoglobin and Hematocrit (Cancer Center  Only)    Standing Status:   Future    Expected Date:   02/09/2024    Expiration Date:   11/16/2024   Follow up  Lab H&H in 12w +/- retacrit   24 weeks  lab prior MD +/- retacrit   All questions were answered. The patient knows to call the clinic with any problems, questions or concerns.  Timmy Forbes, MD, PhD [facility] Hematology Oncology 11/17/2023    INTERVAL HISTORY Tina Mcdonald is a 83 y.o. female who has above history reviewed by me today presents for follow up visit for management of anemia Patient reports feeling well at baseline. No new complaints.    Review of Systems  Constitutional:  Positive for fatigue. Negative for appetite change, chills and fever.  HENT:   Negative for hearing loss and voice change.   Eyes:  Negative for eye problems.  Respiratory:  Negative for chest tightness and cough.   Cardiovascular:  Negative for chest pain.  Gastrointestinal:  Negative for abdominal distention, abdominal pain and blood in stool.  Endocrine: Negative for hot flashes.  Genitourinary:  Negative for difficulty urinating and frequency.   Musculoskeletal:  Negative for arthralgias.  Skin:  Negative for itching and rash.  Neurological:  Negative for extremity weakness.  Hematological:  Negative for adenopathy.  Psychiatric/Behavioral:  Negative for confusion.      MEDICAL HISTORY:  Past Medical History:  Diagnosis Date   Anemia    Arthritis  Chronic kidney disease    Colon polyps    Diabetes mellitus without complication (HCC)    Diabetic neuropathy (HCC)    Dyspnea    GERD (gastroesophageal reflux disease)    Hyperlipemia    Hypertension    Osteoarthritis    Radiculopathy    weakness in right leg   Sleep apnea    does not have her cpap anymore   Stress incontinence    Urticaria    Vitamin B 12 deficiency    Wears glasses     SURGICAL HISTORY: Past Surgical History:  Procedure Laterality Date   ABDOMINAL HYSTERECTOMY     APPENDECTOMY     BREAST BIOPSY  Right    right-neg   BREAST BIOPSY Left    us /bx/clip-neg   CARPAL TUNNEL RELEASE     right   COLONOSCOPY     COLONOSCOPY WITH PROPOFOL  N/A 12/10/2015   Procedure: COLONOSCOPY WITH PROPOFOL ;  Surgeon: Marnee Sink, MD;  Location: [facility];  Service: Endoscopy;  Laterality: N/A;  Diabetic - oral meds Sleep Apnea LATEX allergy   COLONOSCOPY WITH PROPOFOL  N/A 01/17/2019   Procedure: COLONOSCOPY WITH PROPOFOL ;  Surgeon: Irby Mannan, MD;  Location: [facility];  Service: Endoscopy;  Laterality: N/A;   CYSTOSCOPY     DILATION AND CURETTAGE OF UTERUS     ESOPHAGOGASTRODUODENOSCOPY (EGD) WITH PROPOFOL  N/A 01/17/2019   Procedure: ESOPHAGOGASTRODUODENOSCOPY (EGD) WITH PROPOFOL ;  Surgeon: Irby Mannan, MD;  Location: [facility];  Service: Endoscopy;  Laterality: N/A;   EYE SURGERY Bilateral    lens implant   GIVENS CAPSULE STUDY N/A 05/04/2019   Procedure: GIVENS CAPSULE STUDY;  Surgeon: Irby Mannan, MD;  Location: [facility];  Service: Endoscopy;  Laterality: N/A;   LUMBAR FUSION  2009   POLYPECTOMY  12/10/2015   Procedure: POLYPECTOMY;  Surgeon: Marnee Sink, MD;  Location: [facility];  Service: Endoscopy;;   POLYPECTOMY  01/17/2019   Procedure: POLYPECTOMY;  Surgeon: Irby Mannan, MD;  Location: [facility];  Service: Endoscopy;;   RESECTION DISTAL CLAVICAL Right 11/25/2013   Procedure: RIGHT SHOULDER OPEN RESECTION DISTAL CLAVICAL EXCISION SOFT TISSUE TUMOR SHOULDER DEEP SUBFASCIAL INTRAMUSCULAR/DEBRIDEMENT/ROTATOR CUFF REPAIR/BICEPS TENODESIS/MANIPULATION;  Surgeon: Saundra Curl, MD;  Location: [facility];  Service: Orthopedics;  Laterality: Right;   SHOULDER ARTHROSCOPY WITH ROTATOR CUFF REPAIR AND SUBACROMIAL DECOMPRESSION Right 11/25/2013   Procedure: SHOULDER ARTHROSCOPY WITH DEBRIDEMENT ROTATOR CUFF REPAIR  A;  Surgeon: Saundra Curl, MD;  Location: [facility];  Service:  Orthopedics;  Laterality: Right;    SOCIAL HISTORY: Social History   Socioeconomic History   Marital status: Single    Spouse name: Not on file   Number of children: 1   Years of education: Not on file   Highest education level: 12th grade  Occupational History   Occupation: retired  Tobacco Use   Smoking status: Former    Current packs/day: 0.00    Average packs/day: 1 pack/day for 15.0 years (15.0 ttl pk-yrs)    Types: Cigarettes    Start date: 11/23/1982    Quit date: 11/22/1997    Years since quitting: 26.0   Smokeless tobacco: Never  Vaping Use   Vaping status: Never Used  Substance and Sexual Activity   Alcohol use: No   Drug use: No   Sexual activity: Not Currently  Other Topics Concern   Not on file  Social History Narrative   Not on file   Social Drivers  of Health   Financial Resource Strain: Low Risk  (07/09/2023)   Overall Financial Resource Strain (CARDIA)    Difficulty of Paying Living Expenses: Not hard at all  Food Insecurity: No Food Insecurity (07/09/2023)   Hunger Vital Sign    Worried About Running Out of Food in the Last Year: Never true    Ran Out of Food in the Last Year: Never true  Transportation Needs: No Transportation Needs (07/09/2023)   PRAPARE - Administrator, Civil Service (Medical): No    Lack of Transportation (Non-Medical): No  Physical Activity: Inactive (07/09/2023)   Exercise Vital Sign    Days of Exercise per Week: 0 days    Minutes of Exercise per Session: 0 min  Stress: No Stress Concern Present (07/09/2023)   [employer] of Occupational Health - Occupational Stress Questionnaire    Feeling of Stress : Not at all  Social Connections: Moderately Integrated (07/09/2023)   Social Connection and Isolation Panel [NHANES]    Frequency of Communication with Friends and Family: More than three times a week    Frequency of Social Gatherings with Friends and Family: More than three times a week    Attends Religious  Services: More than 4 times per year    Active Member of [employer] or Organizations: Yes    Attends Banker Meetings: More than 4 times per year    Marital Status: Never married  Intimate Partner Violence: Not At Risk (07/09/2023)   Humiliation, Afraid, Rape, and Kick questionnaire    Fear of Current or Ex-Partner: No    Emotionally Abused: No    Physically Abused: No    Sexually Abused: No    FAMILY HISTORY: Family History  Problem Relation Age of Onset   Heart disease Mother    Diabetes Mother    Hypertension Mother    Heart disease Father    Hypertension Father    Diabetes Sister    Hypertension Sister    Heart disease Sister    Kidney disease Sister    Diabetes Sister    Hypertension Sister    Kidney disease Sister    Non-Hodgkin's lymphoma Maternal Uncle    Breast cancer Cousin 50    ALLERGIES:  is allergic to statins, contrast media [iodinated contrast media], penicillins, sulfa antibiotics, aloe, latex, neosporin [neomycin-bacitracin zn-polymyx], and other.  MEDICATIONS:  Current Outpatient Medications  Medication Sig Dispense Refill   albuterol  (VENTOLIN  HFA) 108 (90 Base) MCG/ACT inhaler Inhale 1-2 puffs into the lungs every 4 (four) hours as needed for wheezing or shortness of breath. 1 each 0   Blood Glucose Monitoring Suppl (ONETOUCH VERIO) w/Device KIT To check blood sugar twice a day with goal fasting in morning <130 and goal 2 hours after meal <180.  Write all checks down for provider visits. 1 kit 0   carvedilol (COREG) 25 MG tablet Take 25 mg by mouth 2 (two) times daily.     chlorthalidone (HYGROTON) 25 MG tablet Take 25 mg by mouth daily.     Cholecalciferol (VITAMIN D -1000 MAX ST) 25 MCG (1000 UT) tablet Take by mouth.     diclofenac  sodium (VOLTAREN ) 1 % GEL Apply 2 g topically 2 (two) times daily as needed. 1 Tube 2   diltiazem  (CARDIZEM  CD) 120 MG 24 hr capsule Take by mouth daily.     Evolocumab  (REPATHA  SURECLICK) 140 MG/ML SOAJ Inject 140  mg into the skin every 14 (fourteen) days. 2 mL 12   ezetimibe  (  ZETIA ) 10 MG tablet Take 1 tablet (10 mg total) by mouth daily. 90 tablet 3   fluticasone  (FLONASE ) 50 MCG/ACT nasal spray Place into both nostrils.     gabapentin  (NEURONTIN ) 100 MG capsule SMARTSIG:Capsule(s) By Mouth     glucose blood (ONETOUCH VERIO) test strip Use as instructed 100 each 12   irbesartan  (AVAPRO ) 150 MG tablet Take 150 mg by mouth daily.     Lancets (ONETOUCH ULTRASOFT) lancets Use as instructed 100 each 12   omeprazole  (PRILOSEC) 20 MG capsule TAKE 1 CAPSULE BY MOUTH TWICE  DAILY BEFORE A MEAL 200 capsule 2   Spacer/Aero-Holding Chambers (AEROCHAMBER MV) inhaler Use as instructed 1 each 1   No current facility-administered medications for this visit.     PHYSICAL EXAMINATION: ECOG PERFORMANCE STATUS: 1 - Symptomatic but completely ambulatory Vitals:   11/17/23 0953  BP: (!) 140/67  Pulse: 68  Resp: 18  Temp: (!) 96.5 F (35.8 C)  SpO2: 99%   Filed Weights   11/17/23 0953  Weight: 166 lb 8 oz (75.5 kg)    Physical Exam Constitutional:      General: She is not in acute distress. HENT:     Head: Normocephalic and atraumatic.  Eyes:     General: No scleral icterus.    Pupils: Pupils are equal, round, and reactive to light.  Cardiovascular:     Rate and Rhythm: Normal rate and regular rhythm.  Pulmonary:     Effort: Pulmonary effort is normal. No respiratory distress.     Breath sounds: No wheezing.  Abdominal:     General: Bowel sounds are normal. There is no distension.     Palpations: Abdomen is soft.  Musculoskeletal:        General: No deformity. Normal range of motion.     Cervical back: Normal range of motion and neck supple.  Skin:    General: Skin is warm and dry.     Findings: No erythema or rash.  Neurological:     Mental Status: She is alert and oriented to person, place, and time.     Cranial Nerves: No cranial nerve deficit.     Coordination: Coordination normal.   Psychiatric:        Mood and Affect: Mood normal.      LABORATORY DATA:  I have reviewed the data as listed    Latest Ref Rng & Units 11/10/2023    9:02 AM 08/18/2023    9:04 AM 06/30/2023    8:56 AM  CBC  WBC 4.0 - 10.5 K/uL 5.7   4.2   Hemoglobin 12.0 - 15.0 g/dL 16.1  09.6  04.5   Hematocrit 36.0 - 46.0 % 35.9  35.8  34.3   Platelets 150 - 400 K/uL 186   214        Latest Ref Rng & Units 10/09/2023   10:55 AM 09/28/2023    8:51 AM 03/23/2023   10:15 AM  CMP  Glucose 70 - 99 mg/dL 409  811  914   BUN 8 - 27 mg/dL 32  27  30   Creatinine 0.57 - 1.00 mg/dL 7.82  9.56  2.13   Sodium 134 - 144 mmol/L 144  136  141   Potassium 3.5 - 5.2 mmol/L 4.8  4.1  4.5   Chloride 96 - 106 mmol/L 107  100  104   CO2 20 - 29 mmol/L 20  25  22    Calcium  8.7 - 10.3 mg/dL 9.6  9.6  9.8   Total Protein 6.0 - 8.5 g/dL 6.1   6.8   Total Bilirubin 0.0 - 1.2 mg/dL <5.2   0.2   Alkaline Phos 44 - 121 IU/L 74   74   AST 0 - 40 IU/L 15   19   ALT 0 - 32 IU/L 13   13     Iron/TIBC/Ferritin/ %Sat    Component Value Date/Time   IRON 48 11/10/2023 0902   IRON 48 10/08/2022 0955   TIBC 262 11/10/2023 0902   TIBC 200 (L) 10/08/2022 0955   FERRITIN 492 (H) 11/10/2023 0902   FERRITIN 1,004 (H) 10/08/2022 0955   IRONPCTSAT 18 11/10/2023 0902   IRONPCTSAT 24 10/08/2022 0955

## 2023-11-17 NOTE — Assessment & Plan Note (Signed)
 Off Slow Fe and vitamin C supplementation for now. Negative hemochromatosis gene mutation Ferritin has improved.

## 2023-11-17 NOTE — Progress Notes (Signed)
 No Retacrit  indicated today

## 2023-11-17 NOTE — Assessment & Plan Note (Addendum)
 Labs are reviewed and discussed with patient. Lab Results  Component Value Date   HGB 11.0 (L) 11/10/2023   TIBC 262 11/10/2023   IRONPCTSAT 18 11/10/2023   FERRITIN 492 (H) 11/10/2023  Hemoglobin is >10 Hold off retacrit .  Continue Q 12w H&H  and retacrit  if hb<=10

## 2024-01-01 DIAGNOSIS — M1712 Unilateral primary osteoarthritis, left knee: Secondary | ICD-10-CM | POA: Diagnosis not present

## 2024-01-23 NOTE — Patient Instructions (Signed)
Be Involved in Caring For Your Health:  Taking Medications When medications are taken as directed, they can greatly improve your health. But if they are not taken as prescribed, they may not work. In some cases, not taking them correctly can be harmful. To help ensure your treatment remains effective and safe, understand your medications and how to take them. Bring your medications to each visit for review by your provider.  Your lab results, notes, and after visit summary will be available on My Chart. We strongly encourage you to use this feature. If lab results are abnormal the clinic will contact you with the appropriate steps. If the clinic does not contact you assume the results are satisfactory. You can always view your results on My Chart. If you have questions regarding your health or results, please contact the clinic during office hours. You can also ask questions on My Chart.  We at [facility] are grateful that you chose [place] to provide your care. We strive to provide evidence-based and compassionate care and are always looking for feedback. If you get a survey from the clinic please complete this so we can hear your opinions.  Diabetes Mellitus and Exercise Regular exercise is important for your health, especially if you have diabetes mellitus. Exercise is not just about losing weight. It can also help you increase muscle strength and bone density and reduce body fat and stress. This can help your level of endurance and make you more fit and flexible. Why should I exercise if I have diabetes? Exercise has many benefits for people with diabetes. It can: Help lower and control your blood sugar (glucose). Help your body respond better and become more sensitive to the hormone insulin. Reduce how much insulin your body needs. Lower your risk for heart disease by: Lowering how much "bad" cholesterol and triglycerides you have in your body. Increasing how much "good" cholesterol  you have in your body. Lowering your blood pressure. Lowering your blood glucose levels. What is my activity plan? Your health care provider or an expert trained in diabetes care (certified diabetes educator) can help you make an activity plan. This plan can help you find the type of exercise that works for you. It may also tell you how often to exercise and for how long. Be sure to: Get at least 150 minutes of medium-intensity or high-intensity exercise each week. This may involve brisk walking, biking, or water aerobics. Do stretching and strengthening exercises at least 2 times a week. This may involve yoga or weight lifting. Spread out your activity over at least 3 days of the week. Get some form of physical activity each day. Do not go more than 2 days in a row without some kind of activity. Avoid being inactive for more than 30 minutes at a time. Take frequent breaks to walk or stretch. Choose activities that you enjoy. Set goals that you know you can accomplish. Start slowly and increase the intensity of your exercise over time. How do I manage my diabetes during exercise?  Monitor your blood glucose Check your blood glucose before and after you exercise. If your blood glucose is 240 mg/dL (40.9 mmol/L) or higher before you exercise, check your urine for ketones. These are chemicals created by the liver. If you have ketones in your urine, do not exercise until your blood glucose returns to normal. If your blood glucose is 100 mg/dL (5.6 mmol/L) or lower, eat a snack that has 15-20 grams of carbohydrate in  it. Check your blood glucose 15 minutes after the snack to make sure that your level is above 100 mg/dL (5.6 mmol/L) before you start to exercise. Your risk for low blood glucose (hypoglycemia) goes up during and after exercise. Know the symptoms of this condition and how to treat it. Follow these instructions at home: Keep a carbohydrate snack on hand for use before, during, and after  exercise. This can help prevent or treat hypoglycemia. Avoid injecting insulin into parts of your body that are going to be used during exercise. This may include: Your arms, when you are going to play tennis. Your legs, when you are about to go jogging. Keep track of your exercise habits. This can help you and your health care provider watch and adjust your activity plan. Write down: What you eat before and after you exercise. Blood glucose levels before and after you exercise. The type and amount of exercise you do. Talk to your health care provider before you start a new activity. They may need to: Make sure that the activity is safe for you. Adjust your insulin, other medicines, and food that you eat. Drink water while you exercise. This can stop you from losing too much water (dehydration). It can also prevent problems caused by having a lot of heat in your body (heat stroke). Where to find more information American Diabetes Association: diabetes.org Association of Diabetes Care & Education Specialists: diabeteseducator.org This information is not intended to replace advice given to you by your health care provider. Make sure you discuss any questions you have with your health care provider. Document Revised: 11/27/2021 Document Reviewed: 11/27/2021 Elsevier Patient Education  2024 [employer].

## 2024-01-28 ENCOUNTER — Ambulatory Visit (INDEPENDENT_AMBULATORY_CARE_PROVIDER_SITE_OTHER): Admitting: Nurse Practitioner

## 2024-01-28 ENCOUNTER — Encounter: Payer: Self-pay | Admitting: Nurse Practitioner

## 2024-01-28 VITALS — BP 136/76 | HR 85 | Temp 98.8°F | Ht 62.0 in | Wt 163.6 lb

## 2024-01-28 DIAGNOSIS — D631 Anemia in chronic kidney disease: Secondary | ICD-10-CM

## 2024-01-28 DIAGNOSIS — E559 Vitamin D deficiency, unspecified: Secondary | ICD-10-CM

## 2024-01-28 DIAGNOSIS — I152 Hypertension secondary to endocrine disorders: Secondary | ICD-10-CM | POA: Diagnosis not present

## 2024-01-28 DIAGNOSIS — E785 Hyperlipidemia, unspecified: Secondary | ICD-10-CM

## 2024-01-28 DIAGNOSIS — R7989 Other specified abnormal findings of blood chemistry: Secondary | ICD-10-CM | POA: Diagnosis not present

## 2024-01-28 DIAGNOSIS — E1159 Type 2 diabetes mellitus with other circulatory complications: Secondary | ICD-10-CM | POA: Diagnosis not present

## 2024-01-28 DIAGNOSIS — I700 Atherosclerosis of aorta: Secondary | ICD-10-CM

## 2024-01-28 DIAGNOSIS — E1169 Type 2 diabetes mellitus with other specified complication: Secondary | ICD-10-CM

## 2024-01-28 DIAGNOSIS — N183 Chronic kidney disease, stage 3 unspecified: Secondary | ICD-10-CM

## 2024-01-28 DIAGNOSIS — N1831 Chronic kidney disease, stage 3a: Secondary | ICD-10-CM | POA: Diagnosis not present

## 2024-01-28 DIAGNOSIS — E1122 Type 2 diabetes mellitus with diabetic chronic kidney disease: Secondary | ICD-10-CM

## 2024-01-28 DIAGNOSIS — E1149 Type 2 diabetes mellitus with other diabetic neurological complication: Secondary | ICD-10-CM | POA: Diagnosis not present

## 2024-01-28 LAB — BAYER DCA HB A1C WAIVED: HB A1C (BAYER DCA - WAIVED): 6.9 % — ABNORMAL HIGH (ref 4.8–5.6)

## 2024-01-28 MED ORDER — EZETIMIBE 10 MG PO TABS: 10.0000 mg | ORAL_TABLET | Freq: Every day | ORAL | 3 refills | Status: AC

## 2024-01-28 MED ORDER — IRBESARTAN 150 MG PO TABS: 150.0000 mg | ORAL_TABLET | Freq: Every day | ORAL | 3 refills | Status: AC

## 2024-01-28 NOTE — Assessment & Plan Note (Signed)
 Chronic, ongoing. Unable to take statin due to rhabdo with associated AKI. Continue taking Repatha  and Zetia  at this time.  Lipid panel today.

## 2024-01-28 NOTE — Assessment & Plan Note (Signed)
 Chronic, stable. Continue collaboration with hematology as needed. CMP and TSH today.

## 2024-01-28 NOTE — Assessment & Plan Note (Signed)
 Chronic, ongoing. A1c stable at 6.9% today, slight trend up but has been eating more junk food and drinking Ensure. Recommend she cut back on these and change to Glucerna. Continue collaboration with nephrology locally and at [facility], recent labs reviewed.  Urine ALB 21 October 2023, continue ARB.  Consider addition of Farxiga in future for kidney and heart health.  Continue Gabapentin  at night. Return in 3 months.  - Up to date on eye and foot exam - Vaccinations up to date - ARB on board and Praluent  -- can not take statin

## 2024-01-28 NOTE — Assessment & Plan Note (Signed)
Chronic, ongoing.  Noted on [facility] labs 04/02/21 at 12.1, continue supplement and recheck labs today.

## 2024-01-28 NOTE — Assessment & Plan Note (Signed)
 Chronic, stable. BP a little above goal, has not taken medication yet. Recommend BP checks at home a few times per week at home. DASH diet at home. Continue all current medications, reviewed with her.  She is to message provider if levels consistently >130/80 at home with changes. Labs: TSH, CMP.  Urine ALB 21 October 2023, continue Irbesartan .

## 2024-01-28 NOTE — Assessment & Plan Note (Signed)
 Chronic, ongoing noted on CT imaging 01/26/17. Unable to tolerate statins. Continue taking Repatha  and Zetia .

## 2024-01-28 NOTE — Assessment & Plan Note (Signed)
Followed by hematology, will continue this collaboration.  Recent note reviewed.

## 2024-01-28 NOTE — Progress Notes (Signed)
 BP 136/76 (BP Location: Left Arm)   Pulse 85   Temp 98.8 F (37.1 C) (Oral)   Ht 5' 2 (1.575 m)   Wt 163 lb 9.6 oz (74.2 kg)   LMP  (LMP Unknown)   SpO2 97%   BMI 29.92 kg/m    Subjective:    Patient ID: Tina Mcdonald, female    DOB: Dec 14, 1940, 83 y.o.   MRN: [id number]  HPI: Tina Mcdonald is a 83 y.o. female  Chief Complaint  Patient presents with   Chronic Kidney Disease   Diabetes   Hyperlipidemia   Hypertension   Would like right ear looked at as having a dull vibration to ear for over 2 months. At baseline has tinnitus to this ear.  Has not taken medication yet this morning.  DIABETES April A1c 6.7%. No current medications for diabetes, diet focus. Continues Gabapentin  300 MG at night for radiculopathy and leg discomfort. Past diabetes medication: Metformin . Has been snacking more often + drinking Ensure. Hypoglycemic episodes:no Polydipsia/polyuria: no Visual disturbance: no Chest pain: no Paresthesias: no Glucose Monitoring: yes             Accucheck frequency: occasional             Fasting glucose:              Post prandial:             Evening:             Before meals: Taking Insulin ?: no             Long acting insulin :             Short acting insulin : Blood Pressure Monitoring: occasional Retinal Examination: Up To Date -- [employer] Foot Exam: Up to Date Pneumovax: Up to Date Influenza: Up to Date Aspirin : no   HYPERTENSION / HYPERLIPIDEMIA Taking Carvedilol, Chlorthalidone, Avapro , Repatha , Zetia , and Diltiazem . Known aortic atherosclerosis noted on past CT 01/26/17.   MRI heart on 10/16/22 last with mild LVH, EF >65%, mild mitral valve regurgitation, idiopathic hypotrophic subaortic stenosis. Last saw cardiology on 01/29/23, Dr. Florencio.    History: Statin reaction in past that led to admission to hospital and AKI.  Satisfied with current treatment? yes Duration of hypertension: chronic BP monitoring frequency: not checking BP range:   BP medication side effects: no Duration of hyperlipidemia: chronic Cholesterol medication side effects: no Cholesterol supplements: none Medication compliance: good compliance Aspirin : no Recent stressors: no Recurrent headaches: no Visual changes: no Palpitations: no Dyspnea: occasional at baseline, saw pulmonary in past for this Chest pain: no Lower extremity edema: no Dizzy/lightheaded: no  CHRONIC KIDNEY DISEASE (Stage 3a) Last visit with nephrology was 11/05/23. eGFR 46.  Hematology last seen on 11/17/23.  History of low Vitamin D  takes supplement. CKD status: stable Medications renally dose: yes Previous renal evaluation: yes Pneumovax:  Up to Date Influenza Vaccine:  Up to Date    Latest Ref Rng & Units 10/09/2023   10:55 AM 09/28/2023    8:51 AM 03/23/2023   10:15 AM  BMP  Glucose 70 - 99 mg/dL 895  870  893   BUN 8 - 27 mg/dL 32  27  30   Creatinine 0.57 - 1.00 mg/dL 8.80  8.95  8.78   BUN/Creat Ratio 12 - 28 27   25    Sodium 134 - 144 mmol/L 144  136  141   Potassium 3.5 - 5.2 mmol/L 4.8  4.1  4.5   Chloride 96 - 106 mmol/L 107  100  104   CO2 20 - 29 mmol/L 20  25  22    Calcium  8.7 - 10.3 mg/dL 9.6  9.6  9.8        06/24/7972   10:35 AM 10/09/2023   10:55 AM 07/09/2023    9:34 AM 06/22/2023   10:30 AM 03/23/2023   10:07 AM  Depression screen PHQ 2/9  Decreased Interest 0 1 0 0 1  Down, Depressed, Hopeless 0 0 0 0 0  PHQ - 2 Score 0 1 0 0 1  Altered sleeping 0 0 0 0 2  Tired, decreased energy 2 2 0 3 2  Change in appetite 0 2 0 3 1  Feeling bad or failure about yourself  0 0 0 0 0  Trouble concentrating 0 0 0 0 0  Moving slowly or fidgety/restless 0 0 0 0 0  Suicidal thoughts 0 0 0 0 0  PHQ-9 Score 2 5 0 6 6  Difficult doing work/chores Somewhat difficult Not difficult at all Not difficult at all Not difficult at all Somewhat difficult       01/28/2024   10:35 AM 06/22/2023   10:30 AM 03/23/2023   10:08 AM 01/20/2023   10:48 AM  GAD 7 : Generalized  Anxiety Score  Nervous, Anxious, on Edge 0 0 0 0  Control/stop worrying 0 0 0 0  Worry too much - different things 0 0 0 0  Trouble relaxing 0 0 0 0  Restless 0 0 0 0  Easily annoyed or irritable 0 0 0 0  Afraid - awful might happen 0 0 0 0  Total GAD 7 Score 0 0 0 0  Anxiety Difficulty Not difficult at all  Not difficult at all Not difficult at all    Relevant past medical, surgical, family and social history reviewed and updated as indicated. Interim medical history since our last visit reviewed. Allergies and medications reviewed and updated.  Review of Systems  Constitutional:  Negative for activity change, appetite change, diaphoresis, fatigue and fever.  Respiratory:  Negative for cough, chest tightness, shortness of breath and wheezing.   Cardiovascular:  Negative for chest pain, palpitations and leg swelling.  Gastrointestinal: Negative.   Endocrine: Negative for cold intolerance, heat intolerance, polydipsia, polyphagia and polyuria.  Neurological: Negative.   Psychiatric/Behavioral: Negative.      Per HPI unless specifically indicated above     Objective:    BP 136/76 (BP Location: Left Arm)   Pulse 85   Temp 98.8 F (37.1 C) (Oral)   Ht 5' 2 (1.575 m)   Wt 163 lb 9.6 oz (74.2 kg)   LMP  (LMP Unknown)   SpO2 97%   BMI 29.92 kg/m   Wt Readings from Last 3 Encounters:  01/28/24 163 lb 9.6 oz (74.2 kg)  11/17/23 166 lb 8 oz (75.5 kg)  10/09/23 164 lb (74.4 kg)    Physical Exam Vitals and nursing note reviewed.  Constitutional:      General: She is awake. She is not in acute distress.    Appearance: She is well-developed and well-groomed. She is obese. She is not ill-appearing or toxic-appearing.  HENT:     Head: Normocephalic.     Right Ear: Hearing, tympanic membrane, ear canal and external ear normal.     Left Ear: Hearing, tympanic membrane, ear canal and external ear normal.  Eyes:     General: Lids are normal.  Right eye: No discharge.         Left eye: No discharge.     Conjunctiva/sclera: Conjunctivae normal.     Pupils: Pupils are equal, round, and reactive to light.  Neck:     Thyroid : No thyromegaly.     Vascular: No carotid bruit.  Cardiovascular:     Rate and Rhythm: Normal rate and regular rhythm.     Heart sounds: Murmur heard.     Systolic murmur is present with a grade of 2/6.     No gallop.  Pulmonary:     Effort: Pulmonary effort is normal. No accessory muscle usage or respiratory distress.     Breath sounds: Normal breath sounds.  Abdominal:     General: Bowel sounds are normal.     Palpations: Abdomen is soft. There is no hepatomegaly or splenomegaly.  Musculoskeletal:     Cervical back: Normal range of motion and neck supple.     Lumbar back: Normal.     Right knee: Normal.     Left knee: Normal.     Right lower leg: No edema.     Left lower leg: No edema.  Lymphadenopathy:     Cervical: No cervical adenopathy.  Skin:    General: Skin is warm and dry.  Neurological:     Mental Status: She is alert and oriented to person, place, and time.  Psychiatric:        Attention and Perception: Attention normal.        Mood and Affect: Mood normal.        Speech: Speech normal.        Behavior: Behavior normal. Behavior is cooperative.        Thought Content: Thought content normal.    Results for orders placed or performed in visit on 11/10/23  Vitamin B12   Collection Time: 11/10/23  9:02 AM  Result Value Ref Range   Vitamin B-12 1,075 (H) 180 - 914 pg/mL  Ferritin   Collection Time: 11/10/23  9:02 AM  Result Value Ref Range   Ferritin 492 (H) 11 - 307 ng/mL  Iron and TIBC   Collection Time: 11/10/23  9:02 AM  Result Value Ref Range   Iron 48 28 - 170 ug/dL   TIBC 737 749 - 549 ug/dL   Saturation Ratios 18 10.4 - 31.8 %   UIBC 214 ug/dL  CBC with Differential (Cancer Center Only)   Collection Time: 11/10/23  9:02 AM  Result Value Ref Range   WBC Count 5.7 4.0 - 10.5 K/uL   RBC 4.29 3.87 -  5.11 MIL/uL   Hemoglobin 11.0 (L) 12.0 - 15.0 g/dL   HCT 64.0 (L) 63.9 - 53.9 %   MCV 83.7 80.0 - 100.0 fL   MCH 25.6 (L) 26.0 - 34.0 pg   MCHC 30.6 30.0 - 36.0 g/dL   RDW 85.3 88.4 - 84.4 %   Platelet Count 186 150 - 400 K/uL   nRBC 0.0 0.0 - 0.2 %   Neutrophils Relative % 62 %   Neutro Abs 3.5 1.7 - 7.7 K/uL   Lymphocytes Relative 23 %   Lymphs Abs 1.3 0.7 - 4.0 K/uL   Monocytes Relative 10 %   Monocytes Absolute 0.6 0.1 - 1.0 K/uL   Eosinophils Relative 4 %   Eosinophils Absolute 0.2 0.0 - 0.5 K/uL   Basophils Relative 0 %   Basophils Absolute 0.0 0.0 - 0.1 K/uL   Immature Granulocytes 1 %  Abs Immature Granulocytes 0.03 0.00 - 0.07 K/uL      Assessment & Plan:   Problem List Items Addressed This Visit       Cardiovascular and Mediastinum   Hypertension associated with diabetes (HCC)   Chronic, stable. BP a little above goal, has not taken medication yet. Recommend BP checks at home a few times per week at home. DASH diet at home. Continue all current medications, reviewed with her.  She is to message provider if levels consistently >130/80 at home with changes. Labs: TSH, CMP.  Urine ALB 21 October 2023, continue Irbesartan .       Relevant Medications   ezetimibe  (ZETIA ) 10 MG tablet   irbesartan  (AVAPRO ) 150 MG tablet   Other Relevant Orders   Bayer DCA Hb A1c Waived   TSH   Aortic atherosclerosis (HCC)   Chronic, ongoing noted on CT imaging 01/26/17. Unable to tolerate statins. Continue taking Repatha  and Zetia .      Relevant Medications   ezetimibe  (ZETIA ) 10 MG tablet   irbesartan  (AVAPRO ) 150 MG tablet     Endocrine   Hyperlipidemia associated with type 2 diabetes mellitus (HCC)   Chronic, ongoing. Unable to take statin due to rhabdo with associated AKI. Continue taking Repatha  and Zetia  at this time.  Lipid panel today.      Relevant Medications   ezetimibe  (ZETIA ) 10 MG tablet   irbesartan  (AVAPRO ) 150 MG tablet   Other Relevant Orders   Bayer DCA Hb  A1c Waived   Comprehensive metabolic panel with GFR   Lipid Panel w/o Chol/HDL Ratio   Diabetic neuropathy (HCC) - Primary   Chronic, ongoing. A1c stable at 6.9% today, slight trend up but has been eating more junk food and drinking Ensure. Recommend she cut back on these and change to Glucerna. Continue collaboration with nephrology locally and at [facility], recent labs reviewed.  Urine ALB 21 October 2023, continue ARB.  Consider addition of Farxiga in future for kidney and heart health.  Continue Gabapentin  at night. Return in 3 months.  - Up to date on eye and foot exam - Vaccinations up to date - ARB on board and Praluent  -- can not take statin      Relevant Medications   irbesartan  (AVAPRO ) 150 MG tablet   Other Relevant Orders   Bayer DCA Hb A1c Waived   CKD stage 3 due to type 2 diabetes mellitus (HCC)   Chronic, ongoing. A1c stable at 6.9% today, slight trend up but has been eating more junk food and drinking Ensure. Recommend she cut back on these and change to Glucerna. Continue collaboration with nephrology locally and at [facility], recent labs reviewed.  Urine ALB 21 October 2023, continue ARB.  Consider addition of Farxiga in future for kidney and heart health.  Return in 3 months.  - Up to date on eye and foot exam - Vaccinations up to date - ARB on board and Praluent  -- can not take statin      Relevant Medications   irbesartan  (AVAPRO ) 150 MG tablet   Other Relevant Orders   Bayer DCA Hb A1c Waived   Comprehensive metabolic panel with GFR     Genitourinary   Anemia due to stage 3 chronic kidney disease (HCC) (Chronic)   Chronic, stable. Continue collaboration with hematology as needed. CMP and TSH today.      Relevant Medications   vitamin B-12 (CYANOCOBALAMIN ) 500 MCG tablet     Other   Elevated ferritin (Chronic)  Followed by hematology, will continue this collaboration.  Recent note reviewed.      Vitamin D  deficiency   Chronic, ongoing.  Noted on [facility] labs 04/02/21 at  12.1, continue supplement and recheck labs today.      Relevant Orders   VITAMIN D  25 Hydroxy (Vit-D Deficiency, Fractures)    Follow up plan: Return in about 3 months (around 04/29/2024) for T2DM, HTN/HLD, CKD.

## 2024-01-28 NOTE — Assessment & Plan Note (Signed)
 Chronic, ongoing. A1c stable at 6.9% today, slight trend up but has been eating more junk food and drinking Ensure. Recommend she cut back on these and change to Glucerna. Continue collaboration with nephrology locally and at [facility], recent labs reviewed.  Urine ALB 21 October 2023, continue ARB.  Consider addition of Farxiga in future for kidney and heart health.  Return in 3 months.  - Up to date on eye and foot exam - Vaccinations up to date - ARB on board and Praluent  -- can not take statin

## 2024-01-29 ENCOUNTER — Ambulatory Visit: Payer: Self-pay | Admitting: Nurse Practitioner

## 2024-01-29 LAB — LIPID PANEL W/O CHOL/HDL RATIO
Cholesterol, Total: 169 mg/dL (ref 100–199)
HDL: 68 mg/dL (ref >39)
LDL Chol Calc (NIH): 80 mg/dL (ref 0–99)
Triglycerides: 121 mg/dL (ref 0–149)
VLDL Cholesterol Cal: 21 mg/dL (ref 5–40)

## 2024-01-29 LAB — TSH: TSH: 0.746 u[IU]/mL (ref 0.450–4.500)

## 2024-01-29 LAB — COMPREHENSIVE METABOLIC PANEL WITH GFR
ALT: 12 IU/L (ref 0–32)
AST: 15 IU/L (ref 0–40)
Albumin: 4.2 g/dL (ref 3.7–4.7)
Alkaline Phosphatase: 63 IU/L (ref 44–121)
BUN/Creatinine Ratio: 22 (ref 12–28)
BUN: 29 mg/dL — ABNORMAL HIGH (ref 8–27)
Bilirubin Total: <0.2 mg/dL (ref 0.0–1.2)
CO2: 20 mmol/L (ref 20–29)
Calcium: 9.6 mg/dL (ref 8.7–10.3)
Chloride: 107 mmol/L — ABNORMAL HIGH (ref 96–106)
Creatinine, Ser: 1.34 mg/dL — ABNORMAL HIGH (ref 0.57–1.00)
Globulin, Total: 2.5 g/dL (ref 1.5–4.5)
Glucose: 102 mg/dL — ABNORMAL HIGH (ref 70–99)
Potassium: 4.2 mmol/L (ref 3.5–5.2)
Sodium: 144 mmol/L (ref 134–144)
Total Protein: 6.7 g/dL (ref 6.0–8.5)
eGFR: 40 mL/min/1.73 — ABNORMAL LOW (ref >59)

## 2024-01-29 LAB — VITAMIN D 25 HYDROXY (VIT D DEFICIENCY, FRACTURES): Vit D, 25-Hydroxy: 47.4 ng/mL (ref 30.0–100.0)

## 2024-01-29 NOTE — Progress Notes (Signed)
 Contacted via MyChart  Good afternoon Tina Mcdonald, your labs have returned: - Kidney function continues to show stable kidney disease with no worsening. Good news. Liver function is normal. - Lipid panel remains stable, continue injections as ordered.  Any questions? Keep being stellar!!  Thank you for allowing me to participate in your care.  I appreciate you. Kindest regards, Arlyss Weathersby

## 2024-02-02 DIAGNOSIS — I517 Cardiomegaly: Secondary | ICD-10-CM | POA: Diagnosis not present

## 2024-02-02 DIAGNOSIS — E7849 Other hyperlipidemia: Secondary | ICD-10-CM | POA: Diagnosis not present

## 2024-02-02 DIAGNOSIS — G4730 Sleep apnea, unspecified: Secondary | ICD-10-CM | POA: Diagnosis not present

## 2024-02-02 DIAGNOSIS — I129 Hypertensive chronic kidney disease with stage 1 through stage 4 chronic kidney disease, or unspecified chronic kidney disease: Secondary | ICD-10-CM | POA: Diagnosis not present

## 2024-02-02 DIAGNOSIS — R0789 Other chest pain: Secondary | ICD-10-CM | POA: Diagnosis not present

## 2024-02-02 DIAGNOSIS — I10 Essential (primary) hypertension: Secondary | ICD-10-CM | POA: Diagnosis not present

## 2024-02-02 DIAGNOSIS — I272 Pulmonary hypertension, unspecified: Secondary | ICD-10-CM | POA: Diagnosis not present

## 2024-02-02 DIAGNOSIS — E7800 Pure hypercholesterolemia, unspecified: Secondary | ICD-10-CM | POA: Diagnosis not present

## 2024-02-02 DIAGNOSIS — R0602 Shortness of breath: Secondary | ICD-10-CM | POA: Diagnosis not present

## 2024-02-02 DIAGNOSIS — R002 Palpitations: Secondary | ICD-10-CM | POA: Diagnosis not present

## 2024-02-02 DIAGNOSIS — R55 Syncope and collapse: Secondary | ICD-10-CM | POA: Diagnosis not present

## 2024-02-02 DIAGNOSIS — E1140 Type 2 diabetes mellitus with diabetic neuropathy, unspecified: Secondary | ICD-10-CM | POA: Diagnosis not present

## 2024-02-05 DIAGNOSIS — I129 Hypertensive chronic kidney disease with stage 1 through stage 4 chronic kidney disease, or unspecified chronic kidney disease: Secondary | ICD-10-CM | POA: Diagnosis not present

## 2024-02-05 DIAGNOSIS — I517 Cardiomegaly: Secondary | ICD-10-CM | POA: Diagnosis not present

## 2024-02-05 DIAGNOSIS — R0602 Shortness of breath: Secondary | ICD-10-CM | POA: Diagnosis not present

## 2024-02-05 DIAGNOSIS — R55 Syncope and collapse: Secondary | ICD-10-CM | POA: Diagnosis not present

## 2024-02-05 DIAGNOSIS — R0789 Other chest pain: Secondary | ICD-10-CM | POA: Diagnosis not present

## 2024-02-09 ENCOUNTER — Inpatient Hospital Stay

## 2024-02-09 ENCOUNTER — Inpatient Hospital Stay: Attending: Oncology

## 2024-02-09 ENCOUNTER — Ambulatory Visit
Admission: RE | Admit: 2024-02-09 | Discharge: 2024-02-09 | Disposition: A | Discharge status: Home / Self Care | Source: Ambulatory Visit | Attending: Nurse Practitioner | Admitting: Nurse Practitioner

## 2024-02-09 DIAGNOSIS — Z1231 Encounter for screening mammogram for malignant neoplasm of breast: Secondary | ICD-10-CM | POA: Diagnosis not present

## 2024-02-09 DIAGNOSIS — N183 Chronic kidney disease, stage 3 unspecified: Secondary | ICD-10-CM | POA: Diagnosis not present

## 2024-02-09 DIAGNOSIS — D631 Anemia in chronic kidney disease: Secondary | ICD-10-CM

## 2024-02-09 LAB — HEMOGLOBIN AND HEMATOCRIT (CANCER CENTER ONLY)
HCT: 35.8 % — ABNORMAL LOW (ref 36.0–46.0)
Hemoglobin: 10.8 g/dL — ABNORMAL LOW (ref 12.0–15.0)

## 2024-02-09 NOTE — Progress Notes (Signed)
 Hgb 10.8 today, hold retacrit 

## 2024-02-10 DIAGNOSIS — I517 Cardiomegaly: Secondary | ICD-10-CM | POA: Diagnosis not present

## 2024-02-10 DIAGNOSIS — J449 Chronic obstructive pulmonary disease, unspecified: Secondary | ICD-10-CM | POA: Diagnosis not present

## 2024-02-10 DIAGNOSIS — I129 Hypertensive chronic kidney disease with stage 1 through stage 4 chronic kidney disease, or unspecified chronic kidney disease: Secondary | ICD-10-CM | POA: Diagnosis not present

## 2024-02-10 DIAGNOSIS — E7849 Other hyperlipidemia: Secondary | ICD-10-CM | POA: Diagnosis not present

## 2024-02-10 DIAGNOSIS — G4730 Sleep apnea, unspecified: Secondary | ICD-10-CM | POA: Diagnosis not present

## 2024-02-10 DIAGNOSIS — E1140 Type 2 diabetes mellitus with diabetic neuropathy, unspecified: Secondary | ICD-10-CM | POA: Diagnosis not present

## 2024-02-10 DIAGNOSIS — R051 Acute cough: Secondary | ICD-10-CM | POA: Diagnosis not present

## 2024-02-12 ENCOUNTER — Other Ambulatory Visit: Payer: Self-pay | Admitting: Nurse Practitioner

## 2024-02-15 NOTE — Telephone Encounter (Signed)
 Requested medications are due for refill today.  yes  Requested medications are on the active medications list.  yes  Last refill. 09/05/2023  Future visit scheduled.   yes  Notes to clinic.  Medication is historical.    Requested Prescriptions  Pending Prescriptions Disp Refills   gabapentin  (NEURONTIN ) 100 MG capsule [Pharmacy Med Name: Gabapentin  100 MG Oral Capsule] 200 capsule 2    Sig: TAKE 2 CAPSULES BY MOUTH AT  BEDTIME     Neurology: Anticonvulsants - gabapentin  Failed - 02/15/2024 12:40 PM      Failed - Cr in normal range and within 360 days    Creatinine, Ser  Date Value Ref Range Status  01/28/2024 1.34 (H) 0.57 - 1.00 mg/dL Final   Creatinine, Urine  Date Value Ref Range Status  10/24/2020 28 mg/dL Final         Passed - Completed PHQ-2 or PHQ-9 in the last 360 days      Passed - Valid encounter within last 12 months    Recent Outpatient Visits           2 weeks ago Other diabetic neurological complication associated with type 2 diabetes mellitus (HCC)   [place] [facility] [place], Scott T, NP   4 months ago CKD stage 3 due to type 2 diabetes mellitus (HCC)   [place] [facility] [place], Melanie DASEN, NP

## 2024-02-29 ENCOUNTER — Other Ambulatory Visit: Payer: Self-pay

## 2024-02-29 NOTE — Telephone Encounter (Unsigned)
 Copied from CRM [id number]. Topic: Clinical - Prescription Issue >> Feb 29, 2024  4:17 PM DeAngela L wrote: Reason for CRM: Katelyn calling with [facility] to inform the office that the patients insurance no longer covers the One Touch brand and asking if the office could write a new prescription for the patient to get a different brand that is covered by insurance  Contour Next or Accucheck guide these are the covered brands   [facility] [place] - [address] [place] [address] Phone: [phone] Fax: [phone]

## 2024-03-02 MED ORDER — ACCU-CHEK GUIDE TEST VI STRP: ORAL_STRIP | 12 refills | Status: DC

## 2024-03-02 MED ORDER — ACCU-CHEK GUIDE W/DEVICE KIT: PACK | 1 refills | Status: DC

## 2024-03-02 MED ORDER — ACCU-CHEK SOFTCLIX LANCETS MISC: 12 refills | Status: DC

## 2024-04-28 ENCOUNTER — Inpatient Hospital Stay: Attending: Oncology

## 2024-04-28 DIAGNOSIS — D631 Anemia in chronic kidney disease: Secondary | ICD-10-CM

## 2024-04-28 LAB — CBC WITH DIFFERENTIAL (CANCER CENTER ONLY)
Abs Immature Granulocytes: 0.03 K/uL (ref 0.00–0.07)
Basophils Absolute: 0 K/uL (ref 0.0–0.1)
Basophils Relative: 0 %
Eosinophils Absolute: 0.1 K/uL (ref 0.0–0.5)
Eosinophils Relative: 3 %
HCT: 35.1 % — ABNORMAL LOW (ref 36.0–46.0)
Hemoglobin: 10.5 g/dL — ABNORMAL LOW (ref 12.0–15.0)
Immature Granulocytes: 1 %
Lymphocytes Relative: 21 %
Lymphs Abs: 1 K/uL (ref 0.7–4.0)
MCH: 25.6 pg — ABNORMAL LOW (ref 26.0–34.0)
MCHC: 29.9 g/dL — ABNORMAL LOW (ref 30.0–36.0)
MCV: 85.6 fL (ref 80.0–100.0)
Monocytes Absolute: 0.5 K/uL (ref 0.1–1.0)
Monocytes Relative: 10 %
Neutro Abs: 3.1 K/uL (ref 1.7–7.7)
Neutrophils Relative %: 65 %
Platelet Count: 179 K/uL (ref 150–400)
RBC: 4.1 MIL/uL (ref 3.87–5.11)
RDW: 14.4 % (ref 11.5–15.5)
WBC Count: 4.8 K/uL (ref 4.0–10.5)
nRBC: 0 % (ref 0.0–0.2)

## 2024-04-28 LAB — IRON AND TIBC
Iron: 51 ug/dL (ref 28–170)
Saturation Ratios: 20 % (ref 10.4–31.8)
TIBC: 253 ug/dL (ref 250–450)
UIBC: 202 ug/dL

## 2024-04-28 LAB — FERRITIN: Ferritin: 482 ng/mL — ABNORMAL HIGH (ref 11–307)

## 2024-04-30 NOTE — Patient Instructions (Incomplete)

## 2024-05-02 ENCOUNTER — Ambulatory Visit: Admitting: Nurse Practitioner

## 2024-05-02 DIAGNOSIS — E1169 Type 2 diabetes mellitus with other specified complication: Secondary | ICD-10-CM

## 2024-05-02 DIAGNOSIS — N1831 Chronic kidney disease, stage 3a: Secondary | ICD-10-CM

## 2024-05-02 DIAGNOSIS — Z23 Encounter for immunization: Secondary | ICD-10-CM

## 2024-05-02 DIAGNOSIS — E1159 Type 2 diabetes mellitus with other circulatory complications: Secondary | ICD-10-CM

## 2024-05-02 DIAGNOSIS — E1122 Type 2 diabetes mellitus with diabetic chronic kidney disease: Secondary | ICD-10-CM

## 2024-05-02 DIAGNOSIS — R7989 Other specified abnormal findings of blood chemistry: Secondary | ICD-10-CM

## 2024-05-02 DIAGNOSIS — E1149 Type 2 diabetes mellitus with other diabetic neurological complication: Secondary | ICD-10-CM

## 2024-05-02 DIAGNOSIS — I700 Atherosclerosis of aorta: Secondary | ICD-10-CM

## 2024-05-03 ENCOUNTER — Inpatient Hospital Stay

## 2024-05-03 ENCOUNTER — Inpatient Hospital Stay: Admitting: Oncology

## 2024-05-03 ENCOUNTER — Encounter: Payer: Self-pay | Admitting: Oncology

## 2024-05-03 VITALS — BP 127/57 | HR 79 | Temp 97.1°F | Resp 18 | Wt 164.0 lb

## 2024-05-03 DIAGNOSIS — N1831 Chronic kidney disease, stage 3a: Secondary | ICD-10-CM | POA: Diagnosis not present

## 2024-05-03 DIAGNOSIS — D563 Thalassemia minor: Secondary | ICD-10-CM | POA: Diagnosis not present

## 2024-05-03 DIAGNOSIS — D631 Anemia in chronic kidney disease: Secondary | ICD-10-CM

## 2024-05-03 NOTE — Assessment & Plan Note (Addendum)
 Labs are reviewed and discussed with patient. Lab Results  Component Value Date   HGB 10.5 (L) 04/28/2024   TIBC 253 04/28/2024   IRONPCTSAT 20 04/28/2024   FERRITIN 482 (H) 04/28/2024  Hemoglobin is >10 Hold off retacrit .  Continue Q 8w H&H  and retacrit  if hb<=10

## 2024-05-03 NOTE — Progress Notes (Signed)
 HbG is above a 10. Injection has been held.

## 2024-05-03 NOTE — Progress Notes (Signed)
 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 Lab Results  Component Value Date   HGB 10.5 (L) 04/28/2024   TIBC 253 04/28/2024   IRONPCTSAT 20 04/28/2024   FERRITIN 482 (H) 04/28/2024  Hemoglobin is >10 Hold off retacrit .  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  Chronic microcytosis. 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  Parents with alpha thalassemia trait can pass it on to their children, so I suggest that her family members undergo screening and consider genetic counseling if they are planning to start a family.    Orders Placed This Encounter  Procedures   CBC with Differential (Cancer Center Only)    Standing Status:   Future    Expected Date:   10/31/2024    Expiration Date:   01/29/2025   Ferritin    Standing Status:   Future    Expected Date:   10/31/2024    Expiration Date:   01/29/2025   Iron and TIBC    Standing Status:   Future    Expected Date:   10/31/2024    Expiration Date:   01/29/2025   Retic Panel    Standing Status:   Future    Expected Date:   10/31/2024    Expiration Date:   01/29/2025   Hemoglobin and Hematocrit (Cancer Center Only)    Standing Status:   Standing     Number of Occurrences:   2    Expiration Date:   05/03/2025   Follow up  Lab H&H in 8w , 16w +/- retacrit   24 weeks  lab prior MD +/- retacrit   All questions were answered. The patient knows to call the clinic with any problems, questions or concerns.  Call Babara, MD, PhD [facility] Hematology Oncology 05/03/2024    INTERVAL HISTORY Tina Mcdonald is a 83 y.o. female who has above history reviewed by me today presents for follow up visit for management of anemia Patient reports feeling well at baseline. She feels cold sometimes. TSH was checked recently and was normal.    Review of Systems  Constitutional:  Positive for fatigue. Negative for appetite change, chills and fever.  HENT:   Negative for hearing loss and voice change.   Eyes:  Negative for eye problems.  Respiratory:  Negative for chest tightness and cough.   Cardiovascular:  Negative for chest pain.  Gastrointestinal:  Negative for abdominal distention, abdominal pain and blood in stool.  Endocrine: Negative for hot flashes.  Genitourinary:  Negative for difficulty urinating and frequency.   Musculoskeletal:  Negative for arthralgias.  Skin:  Negative for itching and rash.  Neurological:  Negative for extremity weakness.  Hematological:  Negative for adenopathy.  Psychiatric/Behavioral:  Negative for confusion.  MEDICAL HISTORY:  Past Medical History:  Diagnosis Date   Anemia    Arthritis    Chronic kidney disease    Colon polyps    Diabetes mellitus without complication (HCC)    Diabetic neuropathy (HCC)    Dyspnea    GERD (gastroesophageal reflux disease)    Hyperlipemia    Hypertension    Osteoarthritis    Radiculopathy    weakness in right leg   Sleep apnea    does not have her cpap anymore   Stress incontinence    Urticaria    Vitamin B 12 deficiency    Wears glasses     SURGICAL HISTORY: Past Surgical History:  Procedure Laterality Date   ABDOMINAL HYSTERECTOMY     APPENDECTOMY      BREAST BIOPSY Right    right-neg   BREAST BIOPSY Left    us /bx/clip-neg   CARPAL TUNNEL RELEASE     right   COLONOSCOPY     COLONOSCOPY WITH PROPOFOL  N/A 12/10/2015   Procedure: COLONOSCOPY WITH PROPOFOL ;  Surgeon: Rogelia Copping, MD;  Location: [facility];  Service: Endoscopy;  Laterality: N/A;  Diabetic - oral meds Sleep Apnea LATEX allergy   COLONOSCOPY WITH PROPOFOL  N/A 01/17/2019   Procedure: COLONOSCOPY WITH PROPOFOL ;  Surgeon: Janalyn Keene NOVAK, MD;  Location: [facility];  Service: Endoscopy;  Laterality: N/A;   CYSTOSCOPY     DILATION AND CURETTAGE OF UTERUS     ESOPHAGOGASTRODUODENOSCOPY (EGD) WITH PROPOFOL  N/A 01/17/2019   Procedure: ESOPHAGOGASTRODUODENOSCOPY (EGD) WITH PROPOFOL ;  Surgeon: Janalyn Keene NOVAK, MD;  Location: [facility];  Service: Endoscopy;  Laterality: N/A;   EYE SURGERY Bilateral    lens implant   GIVENS CAPSULE STUDY N/A 05/04/2019   Procedure: GIVENS CAPSULE STUDY;  Surgeon: Janalyn Keene NOVAK, MD;  Location: [facility];  Service: Endoscopy;  Laterality: N/A;   LUMBAR FUSION  2009   POLYPECTOMY  12/10/2015   Procedure: POLYPECTOMY;  Surgeon: Rogelia Copping, MD;  Location: [facility];  Service: Endoscopy;;   POLYPECTOMY  01/17/2019   Procedure: POLYPECTOMY;  Surgeon: Janalyn Keene NOVAK, MD;  Location: [facility];  Service: Endoscopy;;   RESECTION DISTAL CLAVICAL Right 11/25/2013   Procedure: RIGHT SHOULDER OPEN RESECTION DISTAL CLAVICAL EXCISION SOFT TISSUE TUMOR SHOULDER DEEP SUBFASCIAL INTRAMUSCULAR/DEBRIDEMENT/ROTATOR CUFF REPAIR/BICEPS TENODESIS/MANIPULATION;  Surgeon: Evalene JONETTA Chancy, MD;  Location: [facility];  Service: Orthopedics;  Laterality: Right;   SHOULDER ARTHROSCOPY WITH ROTATOR CUFF REPAIR AND SUBACROMIAL DECOMPRESSION Right 11/25/2013   Procedure: SHOULDER ARTHROSCOPY WITH DEBRIDEMENT ROTATOR CUFF REPAIR  A;  Surgeon: Evalene JONETTA Chancy, MD;  Location: [facility];   Service: Orthopedics;  Laterality: Right;    SOCIAL HISTORY: Social History   Socioeconomic History   Marital status: Single    Spouse name: Not on file   Number of children: 1   Years of education: Not on file   Highest education level: Associate degree: occupational, scientist, product/process development, or vocational program  Occupational History   Occupation: retired  Tobacco Use   Smoking status: Former    Current packs/day: 0.00    Average packs/day: 1 pack/day for 15.0 years (15.0 ttl pk-yrs)    Types: Cigarettes    Start date: 11/23/1982    Quit date: 11/22/1997    Years since quitting: 26.4   Smokeless tobacco: Never  Vaping Use   Vaping status: Never Used  Substance and Sexual Activity   Alcohol use: No   Drug use: No   Sexual activity:  Not Currently  Other Topics Concern   Not on file  Social History Narrative   Not on file   Social Drivers of Health   Financial Resource Strain: Low Risk  (01/25/2024)   Overall Financial Resource Strain (CARDIA)    Difficulty of Paying Living Expenses: Not very hard  Food Insecurity: No Food Insecurity (01/25/2024)   Hunger Vital Sign    Worried About Running Out of Food in the Last Year: Never true    Ran Out of Food in the Last Year: Never true  Transportation Needs: No Transportation Needs (01/25/2024)   PRAPARE - Administrator, Civil Service (Medical): No    Lack of Transportation (Non-Medical): No  Physical Activity: Insufficiently Active (01/25/2024)   Exercise Vital Sign    Days of Exercise per Week: 2 days    Minutes of Exercise per Session: 20 min  Stress: No Stress Concern Present (01/25/2024)   [employer] of Occupational Health - Occupational Stress Questionnaire    Feeling of Stress: Not at all  Social Connections: Moderately Integrated (01/25/2024)   Social Connection and Isolation Panel    Frequency of Communication with Friends and Family: More than three times a week    Frequency of Social Gatherings with Friends and  Family: Not on file    Attends Religious Services: More than 4 times per year    Active Member of [employer] or Organizations: Yes    Attends Banker Meetings: More than 4 times per year    Marital Status: Never married  Intimate Partner Violence: Not At Risk (07/09/2023)   Humiliation, Afraid, Rape, and Kick questionnaire    Fear of Current or Ex-Partner: No    Emotionally Abused: No    Physically Abused: No    Sexually Abused: No    FAMILY HISTORY: Family History  Problem Relation Age of Onset   Heart disease Mother    Diabetes Mother    Hypertension Mother    Heart disease Father    Hypertension Father    Diabetes Sister    Hypertension Sister    Heart disease Sister    Kidney disease Sister    Diabetes Sister    Hypertension Sister    Kidney disease Sister    Non-Hodgkin's lymphoma Maternal Uncle    Breast cancer Cousin 50    ALLERGIES:  is allergic to statins, contrast media [iodinated contrast media], penicillins, sulfa antibiotics, aloe, latex, neosporin [neomycin-bacitracin zn-polymyx], and other.  MEDICATIONS:  Current Outpatient Medications  Medication Sig Dispense Refill   albuterol  (VENTOLIN  HFA) 108 (90 Base) MCG/ACT inhaler Inhale 1-2 puffs into the lungs every 4 (four) hours as needed for wheezing or shortness of breath. 1 each 0   Blood Glucose Monitoring Suppl (ONETOUCH VERIO) w/Device KIT To check blood sugar twice a day with goal fasting in morning <130 and goal 2 hours after meal <180.  Write all checks down for provider visits. 1 kit 0   carvedilol (COREG) 25 MG tablet Take 25 mg by mouth 2 (two) times daily.     chlorthalidone (HYGROTON) 25 MG tablet Take 25 mg by mouth daily.     Cholecalciferol (VITAMIN D -1000 MAX ST) 25 MCG (1000 UT) tablet Take by mouth.     diclofenac  sodium (VOLTAREN ) 1 % GEL Apply 2 g topically 2 (two) times daily as needed. 1 Tube 2   diltiazem  (CARDIZEM  CD) 120 MG 24 hr capsule Take by mouth daily.     Evolocumab   (REPATHA  SURECLICK)  140 MG/ML SOAJ Inject 140 mg into the skin every 14 (fourteen) days. 2 mL 12   ezetimibe  (ZETIA ) 10 MG tablet Take 1 tablet (10 mg total) by mouth daily. 90 tablet 3   fluticasone  (FLONASE ) 50 MCG/ACT nasal spray Place into both nostrils.     gabapentin  (NEURONTIN ) 100 MG capsule TAKE 2 CAPSULES BY MOUTH AT  BEDTIME 200 capsule 2   glucose blood (ONETOUCH VERIO) test strip Use as instructed 100 each 12   irbesartan  (AVAPRO ) 150 MG tablet Take 1 tablet (150 mg total) by mouth daily. 90 tablet 3   Lancets (ONETOUCH ULTRASOFT) lancets Use as instructed 100 each 12   omeprazole  (PRILOSEC) 20 MG capsule TAKE 1 CAPSULE BY MOUTH TWICE  DAILY BEFORE A MEAL 200 capsule 2   Spacer/Aero-Holding Chambers (AEROCHAMBER MV) inhaler Use as instructed 1 each 1   vitamin B-12 (CYANOCOBALAMIN ) 500 MCG tablet Take 500 mcg by mouth daily.     No current facility-administered medications for this visit.     PHYSICAL EXAMINATION: ECOG PERFORMANCE STATUS: 1 - Symptomatic but completely ambulatory Vitals:   05/03/24 1006  BP: (!) 127/57  Pulse: 79  Resp: 18  Temp: (!) 97.1 F (36.2 C)  SpO2: 97%   Filed Weights   05/03/24 1006  Weight: 164 lb (74.4 kg)    Physical Exam Constitutional:      General: She is not in acute distress. HENT:     Head: Normocephalic and atraumatic.  Eyes:     General: No scleral icterus.    Pupils: Pupils are equal, round, and reactive to light.  Cardiovascular:     Rate and Rhythm: Normal rate and regular rhythm.  Pulmonary:     Effort: Pulmonary effort is normal. No respiratory distress.     Breath sounds: No wheezing.  Abdominal:     General: Bowel sounds are normal. There is no distension.     Palpations: Abdomen is soft.  Musculoskeletal:        General: No deformity. Normal range of motion.     Cervical back: Normal range of motion and neck supple.  Skin:    General: Skin is warm and dry.     Findings: No erythema or rash.  Neurological:      Mental Status: She is alert and oriented to person, place, and time.     Cranial Nerves: No cranial nerve deficit.     Coordination: Coordination normal.  Psychiatric:        Mood and Affect: Mood normal.      LABORATORY DATA:  I have reviewed the data as listed    Latest Ref Rng & Units 04/28/2024   10:17 AM 02/09/2024    9:12 AM 11/10/2023    9:02 AM  CBC  WBC 4.0 - 10.5 K/uL 4.8   5.7   Hemoglobin 12.0 - 15.0 g/dL 89.4  89.1  88.9   Hematocrit 36.0 - 46.0 % 35.1  35.8  35.9   Platelets 150 - 400 K/uL 179   186        Latest Ref Rng & Units 01/28/2024   10:28 AM 10/09/2023   10:55 AM 09/28/2023    8:51 AM  CMP  Glucose 70 - 99 mg/dL 897  895  870   BUN 8 - 27 mg/dL 29  32  27   Creatinine 0.57 - 1.00 mg/dL 8.65  8.80  8.95   Sodium 134 - 144 mmol/L 144  144  136   Potassium 3.5 -  5.2 mmol/L 4.2  4.8  4.1   Chloride 96 - 106 mmol/L 107  107  100   CO2 20 - 29 mmol/L 20  20  25    Calcium  8.7 - 10.3 mg/dL 9.6  9.6  9.6   Total Protein 6.0 - 8.5 g/dL 6.7  6.1    Total Bilirubin 0.0 - 1.2 mg/dL <9.7  <9.7    Alkaline Phos 44 - 121 IU/L 63  74    AST 0 - 40 IU/L 15  15    ALT 0 - 32 IU/L 12  13      Iron/TIBC/Ferritin/ %Sat    Component Value Date/Time   IRON 51 04/28/2024 1017   IRON 48 10/08/2022 0955   TIBC 253 04/28/2024 1017   TIBC 200 (L) 10/08/2022 0955   FERRITIN 482 (H) 04/28/2024 1017   FERRITIN 1,004 (H) 10/08/2022 0955   IRONPCTSAT 20 04/28/2024 1017   IRONPCTSAT 24 10/08/2022 0955

## 2024-05-03 NOTE — Assessment & Plan Note (Signed)
 No intervention at this point.  Chronic microcytosis. 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  Parents with alpha thalassemia trait can pass it on to their children, so I suggest that her family members undergo screening and consider genetic counseling if they are planning to start a family.

## 2024-05-07 NOTE — Patient Instructions (Incomplete)

## 2024-05-09 ENCOUNTER — Encounter: Payer: Self-pay | Admitting: Nurse Practitioner

## 2024-05-09 ENCOUNTER — Ambulatory Visit (INDEPENDENT_AMBULATORY_CARE_PROVIDER_SITE_OTHER): Admitting: Nurse Practitioner

## 2024-05-09 VITALS — BP 118/64 | HR 74 | Temp 98.1°F | Resp 14 | Ht 62.01 in | Wt 167.2 lb

## 2024-05-09 DIAGNOSIS — E1169 Type 2 diabetes mellitus with other specified complication: Secondary | ICD-10-CM | POA: Diagnosis not present

## 2024-05-09 DIAGNOSIS — E1140 Type 2 diabetes mellitus with diabetic neuropathy, unspecified: Secondary | ICD-10-CM

## 2024-05-09 DIAGNOSIS — I700 Atherosclerosis of aorta: Secondary | ICD-10-CM

## 2024-05-09 DIAGNOSIS — N1831 Chronic kidney disease, stage 3a: Secondary | ICD-10-CM

## 2024-05-09 DIAGNOSIS — I152 Hypertension secondary to endocrine disorders: Secondary | ICD-10-CM

## 2024-05-09 DIAGNOSIS — D631 Anemia in chronic kidney disease: Secondary | ICD-10-CM

## 2024-05-09 DIAGNOSIS — E1149 Type 2 diabetes mellitus with other diabetic neurological complication: Secondary | ICD-10-CM

## 2024-05-09 DIAGNOSIS — Z23 Encounter for immunization: Secondary | ICD-10-CM

## 2024-05-09 DIAGNOSIS — E1159 Type 2 diabetes mellitus with other circulatory complications: Secondary | ICD-10-CM

## 2024-05-09 DIAGNOSIS — R7989 Other specified abnormal findings of blood chemistry: Secondary | ICD-10-CM

## 2024-05-09 DIAGNOSIS — E785 Hyperlipidemia, unspecified: Secondary | ICD-10-CM

## 2024-05-09 DIAGNOSIS — E1122 Type 2 diabetes mellitus with diabetic chronic kidney disease: Secondary | ICD-10-CM | POA: Diagnosis not present

## 2024-05-09 NOTE — Assessment & Plan Note (Signed)
 Chronic, ongoing. Unable to take statin due to rhabdo with associated AKI. Continue taking Repatha  and Zetia  at this time.  Lipid panel today.

## 2024-05-09 NOTE — Assessment & Plan Note (Signed)
 Chronic, ongoing. A1c stable at 5.9% today, trend down. Continue collaboration with nephrology locally and at [facility], recent labs reviewed.  Urine ALB 21 October 2023, continue ARB.  Consider addition of Farxiga in future for kidney and heart health.  Continue Gabapentin  at night. Return in 3 months.  - Up to date on eye and foot exam - Vaccinations up to date - ARB on board and Praluent  -- can not take statin

## 2024-05-09 NOTE — Assessment & Plan Note (Signed)
 Chronic, ongoing noted on CT imaging 01/26/17. Unable to tolerate statins. Continue taking Repatha  and Zetia .

## 2024-05-09 NOTE — Assessment & Plan Note (Signed)
Followed by hematology, will continue this collaboration.  Recent note reviewed.

## 2024-05-09 NOTE — Progress Notes (Signed)
 BP 118/64 (BP Location: Left Arm, Patient Position: Sitting, Cuff Size: Normal)   Pulse 74   Temp 98.1 F (36.7 C) (Oral)   Resp 14   Ht 5' 2.01 (1.575 m)   Wt 167 lb 3.2 oz (75.8 kg)   LMP  (LMP Unknown)   SpO2 98%   BMI 30.57 kg/m    Subjective:    Patient ID: Tina Mcdonald, female    DOB: 04/17/1941, 83 y.o.   MRN: [id number]  HPI: Tina Mcdonald is a 83 y.o. female  Chief Complaint  Patient presents with   Diabetes    Random home checks, all is well.    Anemia    Always tired and cold. Was told to stop taking her iron pills but did start them back on Friday.    Hypertension   Hyperlipidemia   Chronic Kidney Disease   Would like right ear looked at as having a dull vibration to ear for over 2 months. At baseline has tinnitus to this ear.  Has not taken medication yet this morning.  DIABETES August A1c 6.9%. No current medications for diabetes, diet focus. Takes Gabapentin  200 MG at night for radiculopathy. Past diabetes medication: Metformin . Has been snacking more often + drinking Ensure. Hypoglycemic episodes:no Polydipsia/polyuria: no Visual disturbance: no Chest pain: no Paresthesias: no Glucose Monitoring: yes             Accucheck frequency: occasional             Fasting glucose:              Post prandial:             Evening:             Before meals: Taking Insulin ?: no             Long acting insulin :             Short acting insulin : Blood Pressure Monitoring: occasional Retinal Examination: Up To Date -- [employer] Foot Exam: Up to Date Pneumovax: Up to Date Influenza: Up to Date Aspirin : no   HYPERTENSION / HYPERLIPIDEMIA Continues to take Carvedilol, Chlorthalidone, Avapro , Repatha , Zetia , and Diltiazem . Known aortic atherosclerosis noted on past CT 01/26/17. Saw cardiology on 02/10/24 and was treated with Zpack for acute cough. Did see pulmonary in past who recommended sleep study, but this would be to costly and she is on a fixed  income.  MRI heart on 10/16/22 last with mild LVH, EF >65%, mild mitral valve regurgitation, idiopathic hypotrophic subaortic stenosis.   History: Statin reaction in past that led to admission to hospital and AKI.  Satisfied with current treatment? yes Duration of hypertension: chronic BP monitoring frequency: not checking BP range:  BP medication side effects: no Duration of hyperlipidemia: chronic Cholesterol medication side effects: no Cholesterol supplements: none Medication compliance: good compliance Aspirin : no Recent stressors: no Recurrent headaches: no Visual changes: no Palpitations: heart beats faster sometimes when walking recently, she did a Zio monitor with cardiology Dyspnea: occasional at baseline, saw pulmonary in past Chest pain: no Lower extremity edema: occasional Dizzy/lightheaded: no  CHRONIC KIDNEY DISEASE (Stage 3a) Saw nephrology 11/05/23. Hematology last seen on 05/03/24. History of low Vitamin D  takes supplement. Feeling more fatigued and feeling cold often, she restarted iron on her own to see if this helps. Started this over past 3 to 4 months. Not being as active, staying home more.  Lives on own, but has family over a lot.  CKD status: stable Medications renally dose: yes Previous renal evaluation: yes Pneumovax:  Up to Date Influenza Vaccine:  Up to Date    Latest Ref Rng & Units 01/28/2024   10:28 AM 10/09/2023   10:55 AM 09/28/2023    8:51 AM  BMP  Glucose 70 - 99 mg/dL 897  895  870   BUN 8 - 27 mg/dL 29  32  27   Creatinine 0.57 - 1.00 mg/dL 8.65  8.80  8.95   BUN/Creat Ratio 12 - 28 22  27     Sodium 134 - 144 mmol/L 144  144  136   Potassium 3.5 - 5.2 mmol/L 4.2  4.8  4.1   Chloride 96 - 106 mmol/L 107  107  100   CO2 20 - 29 mmol/L 20  20  25    Calcium  8.7 - 10.3 mg/dL 9.6  9.6  9.6        88/82/7974    4:11 PM 01/28/2024   10:35 AM 10/09/2023   10:55 AM 07/09/2023    9:34 AM 06/22/2023   10:30 AM  Depression screen PHQ 2/9   Decreased Interest 1 0 1 0 0  Down, Depressed, Hopeless 0 0 0 0 0  PHQ - 2 Score 1 0 1 0 0  Altered sleeping 2 0 0 0 0  Tired, decreased energy 2 2 2  0 3  Change in appetite 2 0 2 0 3  Feeling bad or failure about yourself  0 0 0 0 0  Trouble concentrating 3 0 0 0 0  Moving slowly or fidgety/restless 0 0 0 0 0  Suicidal thoughts 0 0 0 0 0  PHQ-9 Score 10 2  5   0  6   Difficult doing work/chores Somewhat difficult Somewhat difficult Not difficult at all Not difficult at all Not difficult at all     Data saved with a previous flowsheet row definition       05/09/2024    4:12 PM 01/28/2024   10:35 AM 06/22/2023   10:30 AM 03/23/2023   10:08 AM  GAD 7 : Generalized Anxiety Score  Nervous, Anxious, on Edge 0 0 0 0  Control/stop worrying 0 0 0 0  Worry too much - different things 0 0 0 0  Trouble relaxing 0 0 0 0  Restless 0 0 0 0  Easily annoyed or irritable 0 0 0 0  Afraid - awful might happen 0 0 0 0  Total GAD 7 Score 0 0 0 0  Anxiety Difficulty  Not difficult at all  Not difficult at all   Relevant past medical, surgical, family and social history reviewed and updated as indicated. Interim medical history since our last visit reviewed. Allergies and medications reviewed and updated.  Review of Systems  Constitutional:  Negative for activity change, appetite change, diaphoresis, fatigue and fever.  Respiratory:  Negative for cough, chest tightness, shortness of breath and wheezing.   Cardiovascular:  Negative for chest pain, palpitations and leg swelling.  Gastrointestinal: Negative.   Endocrine: Negative for cold intolerance, heat intolerance, polydipsia, polyphagia and polyuria.  Neurological: Negative.   Psychiatric/Behavioral: Negative.      Per HPI unless specifically indicated above     Objective:    BP 118/64 (BP Location: Left Arm, Patient Position: Sitting, Cuff Size: Normal)   Pulse 74   Temp 98.1 F (36.7 C) (Oral)   Resp 14   Ht 5' 2.01 (1.575 m)   Wt  167 lb 3.2  oz (75.8 kg)   LMP  (LMP Unknown)   SpO2 98%   BMI 30.57 kg/m   Wt Readings from Last 3 Encounters:  05/09/24 167 lb 3.2 oz (75.8 kg)  05/03/24 164 lb (74.4 kg)  01/28/24 163 lb 9.6 oz (74.2 kg)    Physical Exam Vitals and nursing note reviewed.  Constitutional:      General: She is awake. She is not in acute distress.    Appearance: She is well-developed and well-groomed. She is obese. She is not ill-appearing or toxic-appearing.  HENT:     Head: Normocephalic.     Right Ear: Hearing, tympanic membrane, ear canal and external ear normal.     Left Ear: Hearing, tympanic membrane, ear canal and external ear normal.  Eyes:     General: Lids are normal.        Right eye: No discharge.        Left eye: No discharge.     Conjunctiva/sclera: Conjunctivae normal.     Pupils: Pupils are equal, round, and reactive to light.  Neck:     Thyroid : No thyromegaly.     Vascular: No carotid bruit.  Cardiovascular:     Rate and Rhythm: Normal rate and regular rhythm.     Heart sounds: Murmur heard.     Systolic murmur is present with a grade of 2/6.     No gallop.  Pulmonary:     Effort: Pulmonary effort is normal. No accessory muscle usage or respiratory distress.     Breath sounds: Normal breath sounds.  Abdominal:     General: Bowel sounds are normal.     Palpations: Abdomen is soft. There is no hepatomegaly or splenomegaly.  Musculoskeletal:     Cervical back: Normal range of motion and neck supple.     Lumbar back: Normal.     Right knee: Normal.     Left knee: Normal.     Right lower leg: No edema.     Left lower leg: No edema.  Lymphadenopathy:     Cervical: No cervical adenopathy.  Skin:    General: Skin is warm and dry.  Neurological:     Mental Status: She is alert and oriented to person, place, and time.  Psychiatric:        Attention and Perception: Attention normal.        Mood and Affect: Mood normal.        Speech: Speech normal.        Behavior:  Behavior normal. Behavior is cooperative.        Thought Content: Thought content normal.    Results for orders placed or performed in visit on 04/28/24  Ferritin   Collection Time: 04/28/24 10:17 AM  Result Value Ref Range   Ferritin 482 (H) 11 - 307 ng/mL  Iron and TIBC   Collection Time: 04/28/24 10:17 AM  Result Value Ref Range   Iron 51 28 - 170 ug/dL   TIBC 746 749 - 549 ug/dL   Saturation Ratios 20 10.4 - 31.8 %   UIBC 202 ug/dL  CBC with Differential (Cancer Center Only)   Collection Time: 04/28/24 10:17 AM  Result Value Ref Range   WBC Count 4.8 4.0 - 10.5 K/uL   RBC 4.10 3.87 - 5.11 MIL/uL   Hemoglobin 10.5 (L) 12.0 - 15.0 g/dL   HCT 64.8 (L) 63.9 - 53.9 %   MCV 85.6 80.0 - 100.0 fL   MCH 25.6 (L) 26.0 - 34.0  pg   MCHC 29.9 (L) 30.0 - 36.0 g/dL   RDW 85.5 88.4 - 84.4 %   Platelet Count 179 150 - 400 K/uL   nRBC 0.0 0.0 - 0.2 %   Neutrophils Relative % 65 %   Neutro Abs 3.1 1.7 - 7.7 K/uL   Lymphocytes Relative 21 %   Lymphs Abs 1.0 0.7 - 4.0 K/uL   Monocytes Relative 10 %   Monocytes Absolute 0.5 0.1 - 1.0 K/uL   Eosinophils Relative 3 %   Eosinophils Absolute 0.1 0.0 - 0.5 K/uL   Basophils Relative 0 %   Basophils Absolute 0.0 0.0 - 0.1 K/uL   Immature Granulocytes 1 %   Abs Immature Granulocytes 0.03 0.00 - 0.07 K/uL      Assessment & Plan:   Problem List Items Addressed This Visit       Cardiovascular and Mediastinum   Hypertension associated with diabetes (HCC)   Chronic, stable. BP well below goal today. Recommend BP checks at home a few times per week at home. DASH diet at home. Continue all current medications, reviewed with her.  She is to message provider if levels consistently >130/80 at home with changes. Labs: up to date, will check thyroid  labs outpatient due to fatigue.  Urine ALB 21 October 2023, continue Irbesartan .       Relevant Orders   Bayer DCA Hb A1c Waived   Aortic atherosclerosis   Chronic, ongoing noted on CT imaging 01/26/17.  Unable to tolerate statins. Continue taking Repatha  and Zetia .        Endocrine   Hyperlipidemia associated with type 2 diabetes mellitus (HCC)   Chronic, ongoing. Unable to take statin due to rhabdo with associated AKI. Continue taking Repatha  and Zetia  at this time.  Lipid panel today.      Relevant Orders   Bayer DCA Hb A1c Waived   Diabetic neuropathy (HCC)   Chronic, ongoing. A1c stable at 5.9% today, trend down. Continue collaboration with nephrology locally and at [facility], recent labs reviewed.  Urine ALB 21 October 2023, continue ARB.  Consider addition of Farxiga in future for kidney and heart health.  Continue Gabapentin  at night. Return in 3 months.  - Up to date on eye and foot exam - Vaccinations up to date - ARB on board and Praluent  -- can not take statin      Relevant Orders   Bayer DCA Hb A1c Waived   CKD stage 3 due to type 2 diabetes mellitus (HCC) - Primary   Chronic, ongoing. A1c stable at 5.9% today, trend down. Continue collaboration with nephrology locally and at [facility], recent labs reviewed.  Urine ALB 21 October 2023, continue ARB.  Consider addition of Farxiga in future for kidney and heart health.  Return in 3 months.  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 Continue collaboration with hematology, recent note reviewed. Labs up to date.        Other   Elevated ferritin (Chronic)   Followed by hematology, will continue this collaboration.  Recent note reviewed.      Other Visit Diagnoses       Elevated TSH       Check levels  today due to fatigue. ?if OSA present, cannot afford testing or supplies.   Relevant Orders   T4, free   TSH     Flu vaccine need       flu vaccine today, educated patient.   Relevant Orders   Flu vaccine HIGH DOSE  PF(Fluzone Trivalent)       Follow up plan: Return in about 3 months (around 08/09/2024) for T2DM, HTN/HLD, CKD -- plus needs outpatient lab visit.

## 2024-05-09 NOTE — Assessment & Plan Note (Signed)
 Chronic, stable. BP well below goal today. Recommend BP checks at home a few times per week at home. DASH diet at home. Continue all current medications, reviewed with her.  She is to message provider if levels consistently >130/80 at home with changes. Labs: up to date, will check thyroid  labs outpatient due to fatigue.  Urine ALB 21 October 2023, continue Irbesartan .

## 2024-05-09 NOTE — Assessment & Plan Note (Signed)
 Chronic, ongoing. A1c stable at 5.9% today, trend down. Continue collaboration with nephrology locally and at [facility], recent labs reviewed.  Urine ALB 21 October 2023, continue ARB.  Consider addition of Farxiga in future for kidney and heart health.  Return in 3 months.  - Up to date on eye and foot exam - Vaccinations up to date - ARB on board and Praluent  -- can not take statin

## 2024-05-09 NOTE — Assessment & Plan Note (Signed)
 Chronic, stable. Continue collaboration with hematology, recent note reviewed. Labs up to date.

## 2024-05-10 LAB — BAYER DCA HB A1C WAIVED: HB A1C (BAYER DCA - WAIVED): 5.9 % — ABNORMAL HIGH (ref 4.8–5.6)

## 2024-05-11 ENCOUNTER — Other Ambulatory Visit

## 2024-05-11 DIAGNOSIS — R7989 Other specified abnormal findings of blood chemistry: Secondary | ICD-10-CM

## 2024-05-12 ENCOUNTER — Ambulatory Visit: Payer: Self-pay | Admitting: Nurse Practitioner

## 2024-05-12 LAB — T4, FREE: Free T4: 1.27 ng/dL (ref 0.82–1.77)

## 2024-05-12 LAB — TSH: TSH: 1.1 u[IU]/mL (ref 0.450–4.500)

## 2024-05-12 NOTE — Progress Notes (Signed)
 Contacted via MyChart  Thyroid  labs all normal, do not suspect this is cause of fatigue. I do suspect most likely some sleep apnea as we discussed. You could check with your insurance in new year and see if out of pocket cost would still be the same for testing and equipment, if not we could consider getting test done.

## 2024-06-27 ENCOUNTER — Other Ambulatory Visit: Payer: Self-pay | Admitting: Oncology

## 2024-06-27 ENCOUNTER — Other Ambulatory Visit: Payer: Self-pay | Admitting: Nurse Practitioner

## 2024-06-28 ENCOUNTER — Inpatient Hospital Stay

## 2024-06-28 ENCOUNTER — Inpatient Hospital Stay: Attending: Oncology

## 2024-06-28 DIAGNOSIS — D631 Anemia in chronic kidney disease: Secondary | ICD-10-CM | POA: Insufficient documentation

## 2024-06-28 DIAGNOSIS — N183 Chronic kidney disease, stage 3 unspecified: Secondary | ICD-10-CM | POA: Diagnosis present

## 2024-06-28 LAB — HEMOGLOBIN AND HEMATOCRIT (CANCER CENTER ONLY)
HCT: 36.8 % (ref 36.0–46.0)
Hemoglobin: 11.3 g/dL — ABNORMAL LOW (ref 12.0–15.0)

## 2024-06-28 NOTE — Progress Notes (Signed)
 Hgb 11.3 today, no inj given

## 2024-06-28 NOTE — Telephone Encounter (Signed)
 Requested Prescriptions  Pending Prescriptions Disp Refills   REPATHA  SURECLICK 140 MG/ML SOAJ [Pharmacy Med Name: Repatha  SureClick 140 MG/ML Subcutaneous Solution Auto-injector] 2 mL 1    Sig: INJECT 140MG  INTO THE SKIN EVERY 14 DAYS     Cardiovascular: PCSK9 Inhibitors Failed - 06/28/2024 12:36 PM      Failed - Lipid Panel completed within the last 12 months    Cholesterol, Total  Date Value Ref Range Status  01/28/2024 169 100 - 199 mg/dL Final   Cholesterol Piccolo, Waived  Date Value Ref Range Status  09/01/2019 223 (H) <200 mg/dL Final    Comment:                            Desirable                <200                         Borderline High      200- 239                         High                     >239    LDL Chol Calc (NIH)  Date Value Ref Range Status  01/28/2024 80 0 - 99 mg/dL Final   HDL  Date Value Ref Range Status  01/28/2024 68 >39 mg/dL Final   Triglycerides  Date Value Ref Range Status  01/28/2024 121 0 - 149 mg/dL Final   Triglycerides Piccolo,Waived  Date Value Ref Range Status  09/01/2019 161 (H) <150 mg/dL Final    Comment:                            Normal                   <150                         Borderline High     150 - 199                         High                200 - 499                         Very High                >499          Passed - Valid encounter within last 12 months    Recent Outpatient Visits           1 month ago CKD stage 3 due to type 2 diabetes mellitus (HCC)   [facility] [place], Parker T, NP   5 months ago Other diabetic neurological complication associated with type 2 diabetes mellitus (HCC)   [facility] [place], DuBois T, NP   8 months ago CKD stage 3 due to type 2 diabetes mellitus (HCC)   [facility] [place], Melanie DASEN, NP

## 2024-07-05 ENCOUNTER — Encounter: Payer: Self-pay | Admitting: Oncology

## 2024-07-05 ENCOUNTER — Other Ambulatory Visit (HOSPITAL_COMMUNITY): Payer: Self-pay

## 2024-07-05 ENCOUNTER — Telehealth: Payer: Self-pay

## 2024-07-05 DIAGNOSIS — E1149 Type 2 diabetes mellitus with other diabetic neurological complication: Secondary | ICD-10-CM

## 2024-07-05 DIAGNOSIS — E1169 Type 2 diabetes mellitus with other specified complication: Secondary | ICD-10-CM

## 2024-07-05 DIAGNOSIS — E1122 Type 2 diabetes mellitus with diabetic chronic kidney disease: Secondary | ICD-10-CM

## 2024-07-05 NOTE — Telephone Encounter (Signed)
 PA team- can we check and see if this medication needs a prior authorization please?

## 2024-07-05 NOTE — Telephone Encounter (Signed)
 Copied from CRM [id number]. Topic: Clinical - Medication Prior Auth >> Jul 05, 2024  2:47 PM Lauren C wrote: Reason for CRM: Pt went to pick up Evolocumab  (REPATHA  SURECLICK) 140 MG/ML SOAJ and was told it would be $500. Can pt get a prior auth to get this covered?   [facility] [address] [place] - [address] [place] [address] Phone: [phone]  Fax: [phone]  Please return call at [phone]

## 2024-07-06 ENCOUNTER — Other Ambulatory Visit (HOSPITAL_COMMUNITY): Payer: Self-pay

## 2024-07-06 ENCOUNTER — Telehealth: Payer: Self-pay

## 2024-07-06 NOTE — Telephone Encounter (Signed)
 Pharmacy Patient Advocate Encounter   Received notification from Pt Calls Messages that prior authorization for Repathat sureclick 140MG /ML is required/requested.   Insurance verification completed.   The patient is insured through [employer].   Per test claim: The current 30 day co-pay is, $500.64.  No PA needed at this time. This test claim was processed through [facility]- copay amounts may vary at other pharmacies due to pharmacy/plan contracts, or as the patient moves through the different stages of their insurance plan.    - Patient has deductible that needs to be met with insurance.

## 2024-07-08 ENCOUNTER — Telehealth: Payer: Self-pay

## 2024-07-08 NOTE — Progress Notes (Signed)
 Care Guide Pharmacy Note  07/08/2024 Name: Tina Mcdonald MRN: [id number] DOB: 1940-12-12  Referred By: Valerio Melanie DASEN, NP Reason for referral: Complex Care Management (Outreach to schedule with Pharm d )   Tina Mcdonald is a 84 y.o. year old female who is a primary care patient of [name], Jolene T, NP.  Tina Mcdonald was referred to the pharmacist for assistance related to: DMII  Successful contact was made with the patient to discuss pharmacy services including being ready for the pharmacist to call at least 5 minutes before the scheduled appointment time and to have medication bottles and any blood pressure readings ready for review. The patient agreed to meet with the pharmacist via telephone visit on (date/time).07/13/2024  Jeoffrey Buffalo , RMA     [place]  [facility] Guide  Direct Dial: [phone]  Website: [email or web address]

## 2024-07-08 NOTE — Telephone Encounter (Signed)
 Patient aware of the referral. Has appointment scheduled with them next week.

## 2024-07-13 ENCOUNTER — Telehealth: Payer: Self-pay | Admitting: Pharmacy Technician

## 2024-07-13 ENCOUNTER — Other Ambulatory Visit: Payer: Self-pay

## 2024-07-13 DIAGNOSIS — E785 Hyperlipidemia, unspecified: Secondary | ICD-10-CM

## 2024-07-13 DIAGNOSIS — E1169 Type 2 diabetes mellitus with other specified complication: Secondary | ICD-10-CM

## 2024-07-13 NOTE — Progress Notes (Signed)
 "  07/13/2024 Name: Tina Mcdonald MRN: [id number] DOB: 1941/05/23  Chief Complaint  Patient presents with   Medication Assistance   Tina Mcdonald is a 84 y.o. year old female who presented for a telephone visit.   They were referred to the pharmacist by their PCP for assistance in managing medication access.   Subjective:  Care Team: Primary Care Provider: Valerio Melanie DASEN, NP ; Next Scheduled Visit: 08/11/24  Medication Access/Adherence  Current Pharmacy:  [facility] [address] [place] - [address] [place] [address] Phone: [phone] Fax: [phone]  [facility]) - [place], [place] - [address] [facility] [address] [place] [address] [place] [address] Phone: [phone] Fax: [phone]  [facility] - [place], [place] - [address] [place] [address] Phone: [phone] Fax: [phone]  Patient reports affordability concerns with their medications: Yes  Patient reports access/transportation concerns to their pharmacy: No  Patient reports adherence concerns with their medications:  Yes    Hyperlipidemia/ASCVD Risk Reduction Current lipid lowering medications: ezetimibe  10mg  daily, Repatha  140mg  q14 days -Medications tried in the past: patient experienced rhabdomyolysis with statins -Tolerates current medication regimen well but states Repatha  was going to be >$500 to fill this month.  She will be due for another injection next week and does not have any medication on hand.  Objective:  Lab Results  Component Value Date   CREATININE 1.34 (H) 01/28/2024   BUN 29 (H) 01/28/2024   NA 144 01/28/2024   K 4.2 01/28/2024   CL 107 (H) 01/28/2024   CO2 20 01/28/2024   Lab Results  Component Value Date   CHOL 169 01/28/2024   HDL 68 01/28/2024   LDLCALC 80 01/28/2024   TRIG 121 01/28/2024   Medications Reviewed Today     Reviewed by  Deanna Tina DELENA, [facility] (Pharmacist) on 07/13/24 at [phone]  Med List Status: <None>   Medication Order Taking? Sig Documenting Provider Last Dose Status Informant  albuterol  (VENTOLIN  HFA) 108 (90 Base) MCG/ACT inhaler [id number]  Inhale 1-2 puffs into the lungs every 4 (four) hours as needed for wheezing or shortness of breath. Van Knee, MD  Active   Blood Glucose Monitoring Suppl [facility] VERIO) w/Device KIT [id number]  To check blood sugar twice a day with goal fasting in morning <130 and goal 2 hours after meal <180.  Write all checks down for provider visits. [name], Jolene T, NP  Active   carvedilol (COREG) 25 MG tablet [id number]  Take 25 mg by mouth 2 (two) times daily. [provider]  Active   chlorthalidone (HYGROTON) 25 MG tablet [id number]  Take 25 mg by mouth daily. [provider]  Expired 05/03/24 2359   Cholecalciferol (VITAMIN D -1000 MAX ST) 25 MCG (1000 UT) tablet [id number]  Take by mouth. [provider]  Active   diclofenac  sodium (VOLTAREN ) 1 % GEL [id number]  Apply 2 g topically 2 (two) times daily as needed. [name], Jolene T, NP  Active Self  diltiazem  (CARDIZEM  CD) 120 MG 24 hr capsule [id number]  Take by mouth daily. [provider]  Expired 05/03/24 2359            Med Note CATHY, PAULA   Thu Jul 09, 2023  9:28 AM)    Evolocumab  (REPATHA  SURECLICK) 140 MG/ML EMMANUEL [id number]  INJECT 140MG  INTO THE SKIN EVERY 14 DAYS [name], Jolene T, NP  Active   ezetimibe  (ZETIA ) 10 MG tablet [id number]  Take 1 tablet (10 mg total) by mouth daily. [name], Jolene T, NP  Expired 05/03/24 2359   fluticasone  (FLONASE ) 50 MCG/ACT nasal spray [id number]  Place into both nostrils. [provider]  Active   gabapentin  (NEURONTIN ) 100 MG capsule [id number]  TAKE 2 CAPSULES BY MOUTH AT  BEDTIME [name], Jolene T, NP  Active   glucose blood (ONETOUCH VERIO) test strip [id number]  Use as instructed [name], Jolene T, NP  Active   irbesartan  (AVAPRO ) 150 MG  tablet [id number]  Take 1 tablet (150 mg total) by mouth daily. Valerio Melanie DASEN, NP  Active   Lancets [facility] ULTRASOFT) lancets [id number]  Use as instructed [name], Jolene T, NP  Active   omeprazole  (PRILOSEC) 20 MG capsule [id number]  TAKE 1 CAPSULE BY MOUTH TWICE  DAILY BEFORE A MEAL [name], Jolene T, NP  Active   Spacer/Aero-Holding Chambers (AEROCHAMBER MV) inhaler [id number]  Use as instructed Van Knee, MD  Active   vitamin B-12 (CYANOCOBALAMIN ) 500 MCG tablet [id number]  Take 500 mcg by mouth daily. [provider]  Active            Assessment/Plan:   Hyperlipidemia/ASCVD Risk Reduction: -Currently uncontrolled with LDL goal <70 in patient with diabetes -LDL previously down to 45 in April of 2025 -Patient will be due for follow-up lipid panel at next PCP visit.  If >70, I recommend dietary modifications, such as increased soluble fiber intake (fruits, veggies, whole grains), increased healthy fat intake (avocados, nuts, salmon), and reduced saturated and trans fat intake (red meat, processed foods).  -Patient enrolled in [facility] for hypercholesteremia that expired in Dec 2025.  This picks up copay for Repatha  secondary to her Medicare.  I was able to re-enroll her for 2026, and I will coordinate with CPhT to provide processing information to pharmacy and have them refill Repatha  for her.    Tina Mcdonald, PharmD, DPLA    "

## 2024-07-13 NOTE — Progress Notes (Signed)
" ° °  07/13/2024 Name: Tina Mcdonald MRN: [id number] DOB: 22-Dec-1940  Patient is appearing for a follow-up visit with the population health pharmacy technician. Last engaged with the clinical pharmacist to discuss medication assistance on 07/13/2024. Contacted patient today to discuss medication access.   Plan from last clinical pharmacist appointment:  Hyperlipidemia/ASCVD Risk Reduction: -Currently uncontrolled with LDL goal <70 in patient with diabetes -LDL previously down to 45 in April of 2025 -Patient will be due for follow-up lipid panel at next PCP visit.  If >70, I recommend dietary modifications, such as increased soluble fiber intake (fruits, veggies, whole grains), increased healthy fat intake (avocados, nuts, salmon), and reduced saturated and trans fat intake (red meat, processed foods).  -Patient enrolled in [facility] for hypercholesteremia that expired in Dec 2025.  This picks up copay for Repatha  secondary to her Medicare.  I was able to re-enroll her for 2026, and I will coordinate with CPhT to provide processing information to pharmacy and have them refill Repatha  for her.(copy/paste from last note)   Medication Adherence Barriers Identified:   Access issues with any new medication or testing device: Yes Repatha   Medication Adherence Barriers Addressed/Actions Taken:  Reviewed medication changes per plan from last clinical pharmacist note Medication Access for Repatha  Will discuss medication access concerns with pharmacist Contacted pharmacy regarding new prescriptions [facility] pharmacy and spoke to [place]. Provided Clarita secondary grant information for Repatha  Clarita was able to bill the claim to patient's primary insurance and the secondary grant card and the copay is $0. Per Clarita, the prescription should be ready around 4pm today. Per Clarita, she informs the pharmacy system at [facility] is not smart enough to bill fills the same way each time. She informs the  patient will have to remember to tell them to bill at secondary insurance. Typically, she informs, the patient will receive a text message about the refill. If patient can call when that text is received and inform of secondary information then she would not have to wait for a rebill when she comes to get the medication. However, if patient forgets, and is asked to pay a copay when picking up Repatha , she can always remind them at the register about the secondary copay card. Outreach by [employer] technician was requested.  Outreached patient to discuss Repatha  refill. Unfortunately, patient did not pick up the phone and no voicemail was able to be left as voicemail box was full.   Next clinical pharmacist appointment is scheduled for: TBD  Kate Caddy, CPhT [facility] Office: [phone] Email: [email or web address]   "

## 2024-07-21 ENCOUNTER — Ambulatory Visit (INDEPENDENT_AMBULATORY_CARE_PROVIDER_SITE_OTHER): Payer: Self-pay | Admitting: Emergency Medicine

## 2024-07-21 VITALS — Ht 62.0 in | Wt 162.0 lb

## 2024-07-21 DIAGNOSIS — Z0000 Encounter for general adult medical examination without abnormal findings: Secondary | ICD-10-CM

## 2024-07-21 NOTE — Patient Instructions (Addendum)
 Tina Mcdonald,  Thank you for taking the time for your Medicare Wellness Visit. I appreciate your continued commitment to your health goals. Please review the care plan we discussed, and feel free to reach out if I can assist you further.  Please note that Annual Wellness Visits do not include a physical exam. Some assessments may be limited, especially if the visit was conducted virtually. If needed, we may recommend an in-person follow-up with your provider.  Ongoing Care Seeing your primary care provider every 3 to 6 months helps us  monitor your health and provide consistent, personalized care.    Referrals If a referral was made during today's visit and you haven't received any updates within two weeks, please contact the referred provider directly to check on the status.  Recommended Screenings:  You may get a Covid vaccine at at your next office visit on 08/11/24.   Health Maintenance  Topic Date Due   COVID-19 Vaccine (5 - 2025-26 season) 02/22/2024   Medicare Annual Wellness Visit  07/08/2024   Complete foot exam   06/21/2024   Kidney health urinalysis for diabetes  10/08/2024   Hemoglobin A1C  11/06/2024   Eye exam for diabetics  11/10/2024   Yearly kidney function blood test for diabetes  01/27/2025   Breast Cancer Screening  02/08/2025   Osteoporosis screening with Bone Density Scan  10/09/2027   DTaP/Tdap/Td vaccine (3 - Tdap) 08/25/2030   Pneumococcal Vaccine for age over 6  Completed   Flu Shot  Completed   Zoster (Shingles) Vaccine  Completed   Meningitis B Vaccine  Aged Out   Hepatitis C Screening  Discontinued       07/21/2024    9:29 AM  Advanced Directives  Does Patient Have a Medical Advance Directive? Yes  Type of Estate Agent of [place];Living will  Does patient want to make changes to medical advance directive? No - Patient declined  Copy of Healthcare Power of Attorney in Chart? No - copy requested    Vision: Annual vision  screenings are recommended for early detection of glaucoma, cataracts, and diabetic retinopathy. These exams can also reveal signs of chronic conditions such as diabetes and high blood pressure.  Dental: Annual dental screenings help detect early signs of oral cancer, gum disease, and other conditions linked to overall health, including heart disease and diabetes.  Please see the attached documents for additional preventive care recommendations.

## 2024-07-21 NOTE — Progress Notes (Signed)
 "  Chief Complaint  Patient presents with   Medicare Wellness     Subjective:   Tina Mcdonald is a 84 y.o. female who presents for a Medicare Annual Wellness Visit.  Visit info / Clinical Intake: Medicare Wellness Visit Type:: Subsequent Annual Wellness Visit Persons participating in visit and providing information:: patient Medicare Wellness Visit Mode:: Telephone If telephone:: video error Since this visit was completed virtually, some vitals may be partially provided or unavailable. Missing vitals are due to the limitations of the virtual format.: Documented vitals are patient reported If Telephone or Video please confirm:: I connected with patient using audio/video enable telemedicine. I verified patient identity with two identifiers, discussed telehealth limitations, and patient agreed to proceed. Patient Location:: home Provider Location:: clinic office Interpreter Needed?: No Pre-visit prep was completed: yes AWV questionnaire completed by patient prior to visit?: no Living arrangements:: (!) lives alone Patient's Overall Health Status Rating: very good Typical amount of pain: (!) a lot Does pain affect daily life?: no Are you currently prescribed opioids?: no  Dietary Habits and Nutritional Risks How many meals a day?: (!) 1 Eats fruit and vegetables daily?: (!) no (vegetable every day, but not fruit) Most meals are obtained by: eating out In the last 2 weeks, have you had any of the following?: none Diabetic:: (!) yes Any non-healing wounds?: no How often do you check your BS?: as needed Would you like to be referred to a Nutritionist or for Diabetic Management? : no  Functional Status Activities of Daily Living (to include ambulation/medication): Independent Ambulation: Independent with device- listed below Home Assistive Devices/Equipment: Eyeglasses (glasses for reading) Medication Administration: Independent Home Management (perform basic housework or  laundry): Independent Manage your own finances?: (!) no (son helps manage finances) Primary transportation is: driving Concerns about vision?: (!) yes (difficulty with night time driving) Concerns about hearing?: no  Fall Screening Falls in the past year?: 0 Number of falls in past year: 0 Was there an injury with Fall?: 0 Fall Risk Category Calculator: 0 Patient Fall Risk Level: Low Fall Risk  Fall Risk Patient at Risk for Falls Due to: No Fall Risks Fall risk Follow up: Falls evaluation completed  Home and Transportation Safety: All rugs have non-skid backing?: yes All stairs or steps have railings?: yes Grab bars in the bathtub or shower?: yes Have non-skid surface in bathtub or shower?: yes Good home lighting?: yes Regular seat belt use?: yes Hospital stays in the last year:: no  Cognitive Assessment Difficulty concentrating, remembering, or making decisions? : no Will 6CIT or Mini Cog be Completed: yes What year is it?: 0 points What month is it?: 0 points Give patient an address phrase to remember (5 components): [address] [place] About what time is it?: 0 points Count backwards from 20 to 1: 0 points Say the months of the year in reverse: 0 points Repeat the address phrase from earlier: 0 points 6 CIT Score: 0 points  Advance Directives (For Healthcare) Does Patient Have a Medical Advance Directive?: Yes Does patient want to make changes to medical advance directive?: No - Patient declined Type of Advance Directive: Healthcare Power of [place]; Living will Copy of Healthcare Power of Attorney in Chart?: No - copy requested Copy of Living Will in Chart?: No - copy requested  Reviewed/Updated  Reviewed/Updated: Reviewed All (Medical, Surgical, Family, Medications, Allergies, Care Teams, Patient Goals)    Allergies (verified) Statins, Contrast media [iodinated contrast media], Penicillins, Sulfa antibiotics, Aloe, Latex, Neosporin [  neomycin-bacitracin  zn-polymyx], and Other   Current Medications (verified) Outpatient Encounter Medications as of 07/21/2024  Medication Sig   acetaminophen  (TYLENOL ) 650 MG CR tablet Take 1,300 mg by mouth every 8 (eight) hours as needed for pain.   albuterol  (VENTOLIN  HFA) 108 (90 Base) MCG/ACT inhaler Inhale 1-2 puffs into the lungs every 4 (four) hours as needed for wheezing or shortness of breath.   Blood Glucose Monitoring Suppl (ONETOUCH VERIO) w/Device KIT To check blood sugar twice a day with goal fasting in morning <130 and goal 2 hours after meal <180.  Write all checks down for provider visits.   carvedilol (COREG) 25 MG tablet Take 25 mg by mouth 2 (two) times daily.   chlorthalidone (HYGROTON) 25 MG tablet Take 25 mg by mouth daily.   Cholecalciferol (VITAMIN D -1000 MAX ST) 25 MCG (1000 UT) tablet Take by mouth.   diclofenac  sodium (VOLTAREN ) 1 % GEL Apply 2 g topically 2 (two) times daily as needed.   diltiazem  (CARDIZEM  CD) 120 MG 24 hr capsule Take by mouth daily.   Evolocumab  (REPATHA  SURECLICK) 140 MG/ML SOAJ INJECT 140MG  INTO THE SKIN EVERY 14 DAYS   ezetimibe  (ZETIA ) 10 MG tablet Take 1 tablet (10 mg total) by mouth daily.   fluticasone  (FLONASE ) 50 MCG/ACT nasal spray Place into both nostrils. (Patient taking differently: Place into both nostrils. PRN)   gabapentin  (NEURONTIN ) 100 MG capsule TAKE 2 CAPSULES BY MOUTH AT  BEDTIME (Patient taking differently: Take 200 mg by mouth at bedtime. Takes 3 tablets at bedtime)   glucose blood (ONETOUCH VERIO) test strip Use as instructed   irbesartan  (AVAPRO ) 150 MG tablet Take 1 tablet (150 mg total) by mouth daily.   Lancets (ONETOUCH ULTRASOFT) lancets Use as instructed   omeprazole  (PRILOSEC) 20 MG capsule TAKE 1 CAPSULE BY MOUTH TWICE  DAILY BEFORE A MEAL   vitamin B-12 (CYANOCOBALAMIN ) 500 MCG tablet Take 500 mcg by mouth daily.   Spacer/Aero-Holding Chambers (AEROCHAMBER MV) inhaler Use as instructed (Patient not taking: Reported on 07/21/2024)    No facility-administered encounter medications on file as of 07/21/2024.    History: Past Medical History:  Diagnosis Date   Anemia    Arthritis    Chronic kidney disease    Colon polyps    Diabetes mellitus without complication (HCC)    Diabetic neuropathy (HCC)    Dyspnea    GERD (gastroesophageal reflux disease)    Hyperlipemia    Hypertension    Osteoarthritis    Radiculopathy    weakness in right leg   Sleep apnea    does not have her cpap anymore   Stress incontinence    Urticaria    Vitamin B 12 deficiency    Wears glasses    Past Surgical History:  Procedure Laterality Date   ABDOMINAL HYSTERECTOMY     APPENDECTOMY     BREAST BIOPSY Right    right-neg   BREAST BIOPSY Left    us /bx/clip-neg   CARPAL TUNNEL RELEASE     right   COLONOSCOPY     COLONOSCOPY WITH PROPOFOL  N/A 12/10/2015   Procedure: COLONOSCOPY WITH PROPOFOL ;  Surgeon: Rogelia Copping, MD;  Location: [facility];  Service: Endoscopy;  Laterality: N/A;  Diabetic - oral meds Sleep Apnea LATEX allergy   COLONOSCOPY WITH PROPOFOL  N/A 01/17/2019   Procedure: COLONOSCOPY WITH PROPOFOL ;  Surgeon: Janalyn Keene NOVAK, MD;  Location: [facility];  Service: Endoscopy;  Laterality: N/A;   CYSTOSCOPY     DILATION AND  CURETTAGE OF UTERUS     ESOPHAGOGASTRODUODENOSCOPY (EGD) WITH PROPOFOL  N/A 01/17/2019   Procedure: ESOPHAGOGASTRODUODENOSCOPY (EGD) WITH PROPOFOL ;  Surgeon: Janalyn Keene NOVAK, MD;  Location: [facility];  Service: Endoscopy;  Laterality: N/A;   EYE SURGERY Bilateral    lens implant   GIVENS CAPSULE STUDY N/A 05/04/2019   Procedure: GIVENS CAPSULE STUDY;  Surgeon: Janalyn Keene NOVAK, MD;  Location: [facility];  Service: Endoscopy;  Laterality: N/A;   LUMBAR FUSION  2009   POLYPECTOMY  12/10/2015   Procedure: POLYPECTOMY;  Surgeon: Rogelia Copping, MD;  Location: [facility];  Service: Endoscopy;;   POLYPECTOMY  01/17/2019   Procedure: POLYPECTOMY;  Surgeon:  Janalyn Keene NOVAK, MD;  Location: [facility];  Service: Endoscopy;;   RESECTION DISTAL CLAVICAL Right 11/25/2013   Procedure: RIGHT SHOULDER OPEN RESECTION DISTAL CLAVICAL EXCISION SOFT TISSUE TUMOR SHOULDER DEEP SUBFASCIAL INTRAMUSCULAR/DEBRIDEMENT/ROTATOR CUFF REPAIR/BICEPS TENODESIS/MANIPULATION;  Surgeon: Evalene JONETTA Chancy, MD;  Location: [facility];  Service: Orthopedics;  Laterality: Right;   SHOULDER ARTHROSCOPY WITH ROTATOR CUFF REPAIR AND SUBACROMIAL DECOMPRESSION Right 11/25/2013   Procedure: SHOULDER ARTHROSCOPY WITH DEBRIDEMENT ROTATOR CUFF REPAIR  A;  Surgeon: Evalene JONETTA Chancy, MD;  Location: [facility];  Service: Orthopedics;  Laterality: Right;   Family History  Problem Relation Age of Onset   Heart disease Mother    Diabetes Mother    Hypertension Mother    Heart disease Father    Hypertension Father    Diabetes Sister    Hypertension Sister    Heart disease Sister    Kidney disease Sister    Diabetes Sister    Hypertension Sister    Kidney disease Sister    Heart failure Sister        Deceased   Non-Hodgkin's lymphoma Maternal Uncle    Breast cancer Cousin 41   Social History   Occupational History   Occupation: retired  Tobacco Use   Smoking status: Former    Current packs/day: 0.00    Average packs/day: 1 pack/day for 15.0 years (15.0 ttl pk-yrs)    Types: Cigarettes    Start date: 11/23/1982    Quit date: 11/22/1997    Years since quitting: 26.6   Smokeless tobacco: Never  Vaping Use   Vaping status: Never Used  Substance and Sexual Activity   Alcohol use: No   Drug use: No   Sexual activity: Not Currently   Tobacco Counseling Counseling given: Not Answered  SDOH Screenings   Food Insecurity: No Food Insecurity (07/21/2024)  Housing: Low Risk (07/21/2024)  Transportation Needs: No Transportation Needs (07/21/2024)  Utilities: Not At Risk (07/21/2024)  Alcohol Screen: Low Risk (07/09/2023)  Depression (PHQ2-9): Low  Risk (07/21/2024)  Recent Concern: Depression (PHQ2-9) - Medium Risk (05/09/2024)  Financial Resource Strain: Low Risk (05/08/2024)  Physical Activity: Sufficiently Active (07/21/2024)  Recent Concern: Physical Activity - Inactive (05/08/2024)  Social Connections: Moderately Integrated (07/21/2024)  Stress: No Stress Concern Present (07/21/2024)  Tobacco Use: Medium Risk (07/21/2024)  Health Literacy: Adequate Health Literacy (07/21/2024)   See flowsheets for full screening details  Depression Screen PHQ 2 & 9 Depression Scale- Over the past 2 weeks, how often have you been bothered by any of the following problems? Little interest or pleasure in doing things: 0 Feeling down, depressed, or hopeless (PHQ Adolescent also includes...irritable): 0 PHQ-2 Total Score: 0 Trouble falling or staying asleep, or sleeping too much: 0 Feeling tired or having little energy: 1 Poor appetite or overeating (PHQ Adolescent  also includes...weight loss): 2 Feeling bad about yourself - or that you are a failure or have let yourself or your family down: 0 Trouble concentrating on things, such as reading the newspaper or watching television (PHQ Adolescent also includes...like school work): 0 Moving or speaking so slowly that other people could have noticed. Or the opposite - being so fidgety or restless that you have been moving around a lot more than usual: 0 Thoughts that you would be better off dead, or of hurting yourself in some way: 0 PHQ-9 Total Score: 3 If you checked off any problems, how difficult have these problems made it for you to do your work, take care of things at home, or get along with other people?: Not difficult at all  Depression Treatment Depression Interventions/Treatment : [id number] Score <4 Follow-up Not Indicated     Goals Addressed             This Visit's Progress    Patient Stated   Not on track    Exercise when it gets warmer so she can walk             Objective:     Today's Vitals   07/21/24 0922  Weight: 162 lb (73.5 kg)  Height: 5' 2 (1.575 m)   Body mass index is 29.63 kg/m.  Hearing/Vision screen Hearing Screening - Comments:: Denies hearing loss  Vision Screening - Comments:: UTD at [facility] Immunizations and Health Maintenance Health Maintenance  Topic Date Due   COVID-19 Vaccine (5 - 2025-26 season) 02/22/2024   FOOT EXAM  06/21/2024   Diabetic kidney evaluation - Urine ACR  10/08/2024   HEMOGLOBIN A1C  11/06/2024   OPHTHALMOLOGY EXAM  11/10/2024   Diabetic kidney evaluation - eGFR measurement  01/27/2025   Mammogram  02/08/2025   Medicare Annual Wellness (AWV)  07/21/2025   Bone Density Scan  10/09/2027   DTaP/Tdap/Td (3 - Tdap) 08/25/2030   Pneumococcal Vaccine: 50+ Years  Completed   Influenza Vaccine  Completed   Zoster Vaccines- Shingrix  Completed   Meningococcal B Vaccine  Aged Out   Hepatitis C Screening  Discontinued        Assessment/Plan:  This is a routine wellness examination for Mylee.  Patient Care Team: [name], Jolene T, NP as PCP - General (Nurse Practitioner) Babara Call, MD as Consulting Physician (Oncology) [facility], [facility] (Optometry) Jearldine, Laquetta POUR, MD as Attending Physician (Nephrology) Alvia Norleen BIRCH, MD as Consulting Physician (Ophthalmology) Florencio Cara BIRCH, MD as Consulting Physician (Cardiology) Leora Lynwood SAUNDERS, MD as Referring Physician (Orthopedic Surgery)  I have personally reviewed and noted the following in the patients chart:   Medical and social history Use of alcohol, tobacco or illicit drugs  Current medications and supplements including opioid prescriptions. Functional ability and status Nutritional status Physical activity Advanced directives List of other physicians Hospitalizations, surgeries, and ER visits in previous 12 months Vitals Screenings to include cognitive, depression, and falls Referrals and appointments  No orders of the  defined types were placed in this encounter.  In addition, I have reviewed and discussed with patient certain preventive protocols, quality metrics, and best practice recommendations. A written personalized care plan for preventive services as well as general preventive health recommendations were provided to patient.   Vina Ned, CMA   07/21/2024   Return in 1 year (on 07/25/2025) for Medicare Annual Wellness Visit.  After Visit Summary: (MyChart) Due to this being a telephonic visit, the after  visit summary with patients personalized plan was offered to patient via MyChart   Nurse Notes:  Patient wants a Covid vaccine at next OV on 08/11/24 Needs DM foot exam at next OV Declined DM & Nutrition education referral Screening colonoscopy no longer recommended due to age.  "

## 2024-08-11 ENCOUNTER — Ambulatory Visit: Admitting: Nurse Practitioner

## 2024-08-23 ENCOUNTER — Inpatient Hospital Stay

## 2024-10-31 ENCOUNTER — Inpatient Hospital Stay

## 2024-11-03 ENCOUNTER — Inpatient Hospital Stay

## 2024-11-03 ENCOUNTER — Inpatient Hospital Stay: Admitting: Oncology

## 2024-11-16 ENCOUNTER — Encounter (INDEPENDENT_AMBULATORY_CARE_PROVIDER_SITE_OTHER): Admitting: Ophthalmology

## 2025-07-25 ENCOUNTER — Ambulatory Visit
